# Patient Record
Sex: Female | Born: 1935 | Race: White | Hispanic: No | Marital: Married | Smoking: Never smoker
Health system: Southern US, Community
[De-identification: ages and names within clinical notes are randomized; demographics above are authoritative.]

## PROBLEM LIST (undated history)

## (undated) DIAGNOSIS — D649 Anemia, unspecified: Secondary | ICD-10-CM

## (undated) DIAGNOSIS — IMO0001 Reserved for inherently not codable concepts without codable children: Secondary | ICD-10-CM

## (undated) DIAGNOSIS — K589 Irritable bowel syndrome without diarrhea: Secondary | ICD-10-CM

## (undated) DIAGNOSIS — I499 Cardiac arrhythmia, unspecified: Secondary | ICD-10-CM

## (undated) DIAGNOSIS — R112 Nausea with vomiting, unspecified: Secondary | ICD-10-CM

## (undated) DIAGNOSIS — Z923 Personal history of irradiation: Secondary | ICD-10-CM

## (undated) DIAGNOSIS — J189 Pneumonia, unspecified organism: Secondary | ICD-10-CM

## (undated) DIAGNOSIS — B029 Zoster without complications: Secondary | ICD-10-CM

## (undated) DIAGNOSIS — C50919 Malignant neoplasm of unspecified site of unspecified female breast: Secondary | ICD-10-CM

## (undated) DIAGNOSIS — N200 Calculus of kidney: Secondary | ICD-10-CM

## (undated) DIAGNOSIS — N189 Chronic kidney disease, unspecified: Secondary | ICD-10-CM

## (undated) DIAGNOSIS — K449 Diaphragmatic hernia without obstruction or gangrene: Secondary | ICD-10-CM

## (undated) DIAGNOSIS — I82409 Acute embolism and thrombosis of unspecified deep veins of unspecified lower extremity: Secondary | ICD-10-CM

## (undated) DIAGNOSIS — N183 Chronic kidney disease, stage 3 unspecified: Secondary | ICD-10-CM

## (undated) DIAGNOSIS — I10 Essential (primary) hypertension: Secondary | ICD-10-CM

## (undated) DIAGNOSIS — R0602 Shortness of breath: Secondary | ICD-10-CM

## (undated) DIAGNOSIS — I639 Cerebral infarction, unspecified: Secondary | ICD-10-CM

## (undated) DIAGNOSIS — M1990 Unspecified osteoarthritis, unspecified site: Secondary | ICD-10-CM

## (undated) DIAGNOSIS — Z9889 Other specified postprocedural states: Secondary | ICD-10-CM

## (undated) DIAGNOSIS — Z5189 Encounter for other specified aftercare: Secondary | ICD-10-CM

## (undated) DIAGNOSIS — F419 Anxiety disorder, unspecified: Secondary | ICD-10-CM

## (undated) DIAGNOSIS — J40 Bronchitis, not specified as acute or chronic: Secondary | ICD-10-CM

## (undated) DIAGNOSIS — K635 Polyp of colon: Secondary | ICD-10-CM

## (undated) DIAGNOSIS — K5790 Diverticulosis of intestine, part unspecified, without perforation or abscess without bleeding: Secondary | ICD-10-CM

## (undated) DIAGNOSIS — E785 Hyperlipidemia, unspecified: Secondary | ICD-10-CM

## (undated) DIAGNOSIS — I4892 Unspecified atrial flutter: Secondary | ICD-10-CM

## (undated) DIAGNOSIS — K222 Esophageal obstruction: Secondary | ICD-10-CM

## (undated) DIAGNOSIS — C4490 Unspecified malignant neoplasm of skin, unspecified: Secondary | ICD-10-CM

## (undated) DIAGNOSIS — N390 Urinary tract infection, site not specified: Secondary | ICD-10-CM

## (undated) DIAGNOSIS — K219 Gastro-esophageal reflux disease without esophagitis: Secondary | ICD-10-CM

## (undated) HISTORY — DX: Polyp of colon: K63.5

## (undated) HISTORY — DX: Anxiety disorder, unspecified: F41.9

## (undated) HISTORY — DX: Chronic kidney disease, stage 3 (moderate): N18.3

## (undated) HISTORY — PX: OTHER SURGICAL HISTORY: SHX169

## (undated) HISTORY — DX: Diverticulosis of intestine, part unspecified, without perforation or abscess without bleeding: K57.90

## (undated) HISTORY — DX: Unspecified osteoarthritis, unspecified site: M19.90

## (undated) HISTORY — PX: BILE DUCT STENT PLACEMENT: SHX1227

## (undated) HISTORY — PX: INCISION AND DRAINAGE BREAST ABSCESS: SUR672

## (undated) HISTORY — PX: COLON SURGERY: SHX602

## (undated) HISTORY — DX: Cerebral infarction, unspecified: I63.9

## (undated) HISTORY — PX: REPLACEMENT TOTAL KNEE BILATERAL: SUR1225

## (undated) HISTORY — PX: ABDOMINAL HYSTERECTOMY: SHX81

## (undated) HISTORY — DX: Diaphragmatic hernia without obstruction or gangrene: K44.9

## (undated) HISTORY — DX: Chronic kidney disease, stage 3 unspecified: N18.30

## (undated) HISTORY — DX: Irritable bowel syndrome, unspecified: K58.9

## (undated) HISTORY — DX: Zoster without complications: B02.9

## (undated) HISTORY — PX: CATARACT EXTRACTION W/ INTRAOCULAR LENS  IMPLANT, BILATERAL: SHX1307

## (undated) HISTORY — DX: Morbid (severe) obesity due to excess calories: E66.01

## (undated) HISTORY — DX: Malignant neoplasm of unspecified site of unspecified female breast: C50.919

## (undated) HISTORY — PX: TONSILLECTOMY: SUR1361

## (undated) HISTORY — DX: Unspecified malignant neoplasm of skin, unspecified: C44.90

## (undated) HISTORY — DX: Essential (primary) hypertension: I10

## (undated) HISTORY — DX: Esophageal obstruction: K22.2

## (undated) HISTORY — PX: TOTAL HIP ARTHROPLASTY: SHX124

## (undated) HISTORY — DX: Hyperlipidemia, unspecified: E78.5

## (undated) HISTORY — PX: JOINT REPLACEMENT: SHX530

---

## 1989-02-11 DIAGNOSIS — N200 Calculus of kidney: Secondary | ICD-10-CM

## 1989-02-11 HISTORY — DX: Calculus of kidney: N20.0

## 1998-03-02 ENCOUNTER — Other Ambulatory Visit: Admission: RE | Admit: 1998-03-02 | Discharge: 1998-03-02 | Payer: Self-pay | Admitting: *Deleted

## 1999-04-23 ENCOUNTER — Encounter: Admission: RE | Admit: 1999-04-23 | Discharge: 1999-04-23 | Payer: Self-pay | Admitting: Surgery

## 1999-04-23 ENCOUNTER — Encounter: Payer: Self-pay | Admitting: Surgery

## 1999-04-30 ENCOUNTER — Encounter (INDEPENDENT_AMBULATORY_CARE_PROVIDER_SITE_OTHER): Payer: Self-pay | Admitting: *Deleted

## 1999-04-30 ENCOUNTER — Ambulatory Visit (HOSPITAL_COMMUNITY): Admission: RE | Admit: 1999-04-30 | Discharge: 1999-04-30 | Payer: Self-pay | Admitting: Surgery

## 1999-04-30 ENCOUNTER — Encounter: Payer: Self-pay | Admitting: Surgery

## 1999-07-02 ENCOUNTER — Encounter: Payer: Self-pay | Admitting: Surgery

## 1999-07-06 ENCOUNTER — Encounter (INDEPENDENT_AMBULATORY_CARE_PROVIDER_SITE_OTHER): Payer: Self-pay | Admitting: *Deleted

## 1999-07-06 ENCOUNTER — Inpatient Hospital Stay (HOSPITAL_COMMUNITY): Admission: RE | Admit: 1999-07-06 | Discharge: 1999-07-10 | Payer: Self-pay | Admitting: Surgery

## 1999-11-30 ENCOUNTER — Emergency Department (HOSPITAL_COMMUNITY): Admission: EM | Admit: 1999-11-30 | Discharge: 1999-11-30 | Payer: Self-pay | Admitting: Podiatry

## 2000-06-13 DIAGNOSIS — I639 Cerebral infarction, unspecified: Secondary | ICD-10-CM

## 2000-06-13 HISTORY — DX: Cerebral infarction, unspecified: I63.9

## 2000-09-03 ENCOUNTER — Emergency Department (HOSPITAL_COMMUNITY): Admission: EM | Admit: 2000-09-03 | Discharge: 2000-09-03 | Payer: Self-pay

## 2000-09-03 ENCOUNTER — Encounter: Payer: Self-pay | Admitting: Emergency Medicine

## 2000-09-07 ENCOUNTER — Encounter: Admission: RE | Admit: 2000-09-07 | Discharge: 2000-09-07 | Payer: Self-pay | Admitting: Surgery

## 2000-09-07 ENCOUNTER — Encounter: Payer: Self-pay | Admitting: Surgery

## 2000-09-11 ENCOUNTER — Emergency Department (HOSPITAL_COMMUNITY): Admission: EM | Admit: 2000-09-11 | Discharge: 2000-09-11 | Payer: Self-pay | Admitting: Emergency Medicine

## 2000-09-11 ENCOUNTER — Encounter: Payer: Self-pay | Admitting: Emergency Medicine

## 2000-09-21 ENCOUNTER — Encounter: Admission: RE | Admit: 2000-09-21 | Discharge: 2000-09-21 | Payer: Self-pay | Admitting: Surgery

## 2000-09-21 ENCOUNTER — Encounter: Payer: Self-pay | Admitting: Surgery

## 2000-10-30 ENCOUNTER — Encounter: Payer: Self-pay | Admitting: Surgery

## 2000-11-01 ENCOUNTER — Inpatient Hospital Stay (HOSPITAL_COMMUNITY): Admission: RE | Admit: 2000-11-01 | Discharge: 2000-11-06 | Payer: Self-pay | Admitting: Surgery

## 2000-11-01 ENCOUNTER — Encounter (INDEPENDENT_AMBULATORY_CARE_PROVIDER_SITE_OTHER): Payer: Self-pay | Admitting: Specialist

## 2000-11-15 ENCOUNTER — Ambulatory Visit: Admission: RE | Admit: 2000-11-15 | Discharge: 2001-02-13 | Payer: Self-pay | Admitting: Radiation Oncology

## 2000-11-16 ENCOUNTER — Encounter: Admission: RE | Admit: 2000-11-16 | Discharge: 2000-11-16 | Payer: Self-pay | Admitting: Family Medicine

## 2000-11-16 ENCOUNTER — Encounter: Payer: Self-pay | Admitting: Family Medicine

## 2001-02-14 ENCOUNTER — Ambulatory Visit: Admission: RE | Admit: 2001-02-14 | Discharge: 2001-05-15 | Payer: Self-pay | Admitting: Radiation Oncology

## 2001-10-01 ENCOUNTER — Encounter: Payer: Self-pay | Admitting: Family Medicine

## 2001-10-01 ENCOUNTER — Encounter: Admission: RE | Admit: 2001-10-01 | Discharge: 2001-10-01 | Payer: Self-pay | Admitting: Family Medicine

## 2001-10-02 ENCOUNTER — Encounter: Admission: RE | Admit: 2001-10-02 | Discharge: 2001-10-02 | Payer: Self-pay | Admitting: Family Medicine

## 2001-10-02 ENCOUNTER — Encounter: Payer: Self-pay | Admitting: Family Medicine

## 2002-01-22 ENCOUNTER — Encounter: Payer: Self-pay | Admitting: Family Medicine

## 2002-01-22 ENCOUNTER — Encounter: Admission: RE | Admit: 2002-01-22 | Discharge: 2002-01-22 | Payer: Self-pay | Admitting: Family Medicine

## 2002-05-27 ENCOUNTER — Inpatient Hospital Stay (HOSPITAL_COMMUNITY): Admission: EM | Admit: 2002-05-27 | Discharge: 2002-05-28 | Payer: Self-pay | Admitting: Emergency Medicine

## 2002-05-27 ENCOUNTER — Encounter: Payer: Self-pay | Admitting: *Deleted

## 2002-05-27 ENCOUNTER — Encounter: Payer: Self-pay | Admitting: Cardiovascular Disease

## 2002-05-27 ENCOUNTER — Encounter: Payer: Self-pay | Admitting: Emergency Medicine

## 2002-05-30 ENCOUNTER — Encounter: Admission: RE | Admit: 2002-05-30 | Discharge: 2002-06-10 | Payer: Self-pay | Admitting: Neurology

## 2003-01-27 ENCOUNTER — Encounter: Admission: RE | Admit: 2003-01-27 | Discharge: 2003-01-27 | Payer: Self-pay | Admitting: Internal Medicine

## 2003-01-27 ENCOUNTER — Encounter: Payer: Self-pay | Admitting: Internal Medicine

## 2003-03-06 ENCOUNTER — Encounter: Payer: Self-pay | Admitting: *Deleted

## 2003-03-06 ENCOUNTER — Emergency Department (HOSPITAL_COMMUNITY): Admission: EM | Admit: 2003-03-06 | Discharge: 2003-03-07 | Payer: Self-pay | Admitting: *Deleted

## 2003-04-15 ENCOUNTER — Ambulatory Visit (HOSPITAL_COMMUNITY): Admission: RE | Admit: 2003-04-15 | Discharge: 2003-04-15 | Payer: Self-pay | Admitting: Gastroenterology

## 2003-04-15 ENCOUNTER — Encounter (INDEPENDENT_AMBULATORY_CARE_PROVIDER_SITE_OTHER): Payer: Self-pay | Admitting: Specialist

## 2003-04-15 LAB — HM COLONOSCOPY

## 2003-06-29 ENCOUNTER — Encounter: Admission: RE | Admit: 2003-06-29 | Discharge: 2003-06-29 | Payer: Self-pay | Admitting: Orthopedic Surgery

## 2003-07-14 ENCOUNTER — Ambulatory Visit (HOSPITAL_COMMUNITY): Admission: RE | Admit: 2003-07-14 | Discharge: 2003-07-14 | Payer: Self-pay | Admitting: Orthopedic Surgery

## 2004-02-02 ENCOUNTER — Encounter: Admission: RE | Admit: 2004-02-02 | Discharge: 2004-02-02 | Payer: Self-pay | Admitting: Internal Medicine

## 2005-02-15 ENCOUNTER — Encounter: Admission: RE | Admit: 2005-02-15 | Discharge: 2005-02-15 | Payer: Self-pay | Admitting: Internal Medicine

## 2005-03-16 ENCOUNTER — Ambulatory Visit: Payer: Self-pay | Admitting: Internal Medicine

## 2005-03-28 ENCOUNTER — Encounter: Admission: RE | Admit: 2005-03-28 | Discharge: 2005-04-26 | Payer: Self-pay | Admitting: Internal Medicine

## 2005-07-25 ENCOUNTER — Ambulatory Visit: Payer: Self-pay | Admitting: Internal Medicine

## 2006-02-07 ENCOUNTER — Emergency Department (HOSPITAL_COMMUNITY): Admission: EM | Admit: 2006-02-07 | Discharge: 2006-02-07 | Payer: Self-pay | Admitting: *Deleted

## 2006-02-08 ENCOUNTER — Ambulatory Visit: Payer: Self-pay | Admitting: Internal Medicine

## 2006-02-16 ENCOUNTER — Encounter: Admission: RE | Admit: 2006-02-16 | Discharge: 2006-02-16 | Payer: Self-pay | Admitting: Internal Medicine

## 2006-02-20 ENCOUNTER — Encounter: Admission: RE | Admit: 2006-02-20 | Discharge: 2006-02-20 | Payer: Self-pay | Admitting: Orthopedic Surgery

## 2006-07-10 ENCOUNTER — Emergency Department (HOSPITAL_COMMUNITY): Admission: EM | Admit: 2006-07-10 | Discharge: 2006-07-10 | Payer: Self-pay | Admitting: Emergency Medicine

## 2006-07-31 ENCOUNTER — Encounter: Admission: RE | Admit: 2006-07-31 | Discharge: 2006-07-31 | Payer: Self-pay | Admitting: Orthopedic Surgery

## 2006-08-11 ENCOUNTER — Encounter: Admission: RE | Admit: 2006-08-11 | Discharge: 2006-08-11 | Payer: Self-pay | Admitting: General Surgery

## 2006-08-16 ENCOUNTER — Encounter: Admission: RE | Admit: 2006-08-16 | Discharge: 2006-08-16 | Payer: Self-pay | Admitting: General Surgery

## 2006-08-31 ENCOUNTER — Ambulatory Visit: Payer: Self-pay | Admitting: Internal Medicine

## 2006-09-06 ENCOUNTER — Ambulatory Visit: Payer: Self-pay | Admitting: Internal Medicine

## 2006-09-13 ENCOUNTER — Ambulatory Visit (HOSPITAL_COMMUNITY): Admission: RE | Admit: 2006-09-13 | Discharge: 2006-09-13 | Payer: Self-pay | Admitting: Internal Medicine

## 2006-09-21 ENCOUNTER — Encounter: Payer: Self-pay | Admitting: Internal Medicine

## 2006-09-21 ENCOUNTER — Ambulatory Visit: Payer: Self-pay

## 2006-09-21 ENCOUNTER — Ambulatory Visit: Payer: Self-pay | Admitting: Internal Medicine

## 2006-09-26 ENCOUNTER — Ambulatory Visit: Payer: Self-pay | Admitting: Cardiovascular Disease

## 2006-10-06 ENCOUNTER — Ambulatory Visit: Payer: Self-pay | Admitting: Internal Medicine

## 2007-02-10 ENCOUNTER — Encounter: Payer: Self-pay | Admitting: Internal Medicine

## 2007-02-10 DIAGNOSIS — D126 Benign neoplasm of colon, unspecified: Secondary | ICD-10-CM | POA: Insufficient documentation

## 2007-02-10 DIAGNOSIS — E785 Hyperlipidemia, unspecified: Secondary | ICD-10-CM | POA: Insufficient documentation

## 2007-02-10 DIAGNOSIS — M109 Gout, unspecified: Secondary | ICD-10-CM | POA: Insufficient documentation

## 2007-02-10 DIAGNOSIS — I6350 Cerebral infarction due to unspecified occlusion or stenosis of unspecified cerebral artery: Secondary | ICD-10-CM | POA: Insufficient documentation

## 2007-02-10 DIAGNOSIS — K5730 Diverticulosis of large intestine without perforation or abscess without bleeding: Secondary | ICD-10-CM | POA: Insufficient documentation

## 2007-02-10 DIAGNOSIS — F411 Generalized anxiety disorder: Secondary | ICD-10-CM | POA: Insufficient documentation

## 2007-02-10 DIAGNOSIS — I10 Essential (primary) hypertension: Secondary | ICD-10-CM | POA: Insufficient documentation

## 2007-02-10 DIAGNOSIS — K589 Irritable bowel syndrome without diarrhea: Secondary | ICD-10-CM | POA: Insufficient documentation

## 2007-02-12 DIAGNOSIS — Z860100 Personal history of colon polyps, unspecified: Secondary | ICD-10-CM | POA: Insufficient documentation

## 2007-02-12 DIAGNOSIS — Z8679 Personal history of other diseases of the circulatory system: Secondary | ICD-10-CM | POA: Insufficient documentation

## 2007-02-22 ENCOUNTER — Encounter: Admission: RE | Admit: 2007-02-22 | Discharge: 2007-02-22 | Payer: Self-pay | Admitting: Internal Medicine

## 2007-08-24 ENCOUNTER — Encounter: Payer: Self-pay | Admitting: Internal Medicine

## 2007-10-10 ENCOUNTER — Encounter: Payer: Self-pay | Admitting: Internal Medicine

## 2007-10-17 ENCOUNTER — Ambulatory Visit: Payer: Self-pay | Admitting: Internal Medicine

## 2007-10-17 DIAGNOSIS — R5383 Other fatigue: Secondary | ICD-10-CM

## 2007-10-17 DIAGNOSIS — J019 Acute sinusitis, unspecified: Secondary | ICD-10-CM | POA: Insufficient documentation

## 2007-10-17 DIAGNOSIS — G609 Hereditary and idiopathic neuropathy, unspecified: Secondary | ICD-10-CM | POA: Insufficient documentation

## 2007-10-17 DIAGNOSIS — R5381 Other malaise: Secondary | ICD-10-CM | POA: Insufficient documentation

## 2007-10-19 LAB — CONVERTED CEMR LAB
ALT: 20 units/L (ref 0–35)
AST: 22 units/L (ref 0–37)
Albumin: 3.4 g/dL — ABNORMAL LOW (ref 3.5–5.2)
Alkaline Phosphatase: 85 units/L (ref 39–117)
BUN: 35 mg/dL — ABNORMAL HIGH (ref 6–23)
Basophils Absolute: 0 10*3/uL (ref 0.0–0.1)
Basophils Relative: 0.2 % (ref 0.0–1.0)
Bilirubin, Direct: 0.1 mg/dL (ref 0.0–0.3)
CO2: 31 meq/L (ref 19–32)
Calcium: 9.1 mg/dL (ref 8.4–10.5)
Chloride: 110 meq/L (ref 96–112)
Cholesterol: 221 mg/dL (ref 0–200)
Creatinine, Ser: 1.5 mg/dL — ABNORMAL HIGH (ref 0.4–1.2)
Direct LDL: 164.6 mg/dL
Eosinophils Absolute: 0.2 10*3/uL (ref 0.0–0.7)
Eosinophils Relative: 2.7 % (ref 0.0–5.0)
GFR calc Af Amer: 44 mL/min
GFR calc non Af Amer: 36 mL/min
Glucose, Bld: 144 mg/dL — ABNORMAL HIGH (ref 70–99)
HCT: 42.8 % (ref 36.0–46.0)
HDL: 31.3 mg/dL — ABNORMAL LOW (ref >39.0)
Hemoglobin: 14.1 g/dL (ref 12.0–15.0)
Hgb A1c MFr Bld: 6.5 % — ABNORMAL HIGH (ref 4.6–6.0)
Lymphocytes Relative: 19 % (ref 12.0–46.0)
MCHC: 32.9 g/dL (ref 30.0–36.0)
MCV: 88.4 fL (ref 78.0–100.0)
Monocytes Absolute: 0.6 10*3/uL (ref 0.1–1.0)
Monocytes Relative: 7.8 % (ref 3.0–12.0)
Neutro Abs: 5.7 10*3/uL (ref 1.4–7.7)
Neutrophils Relative %: 70.3 % (ref 43.0–77.0)
Platelets: 216 10*3/uL (ref 150–400)
Potassium: 4.2 meq/L (ref 3.5–5.1)
RBC: 4.84 M/uL (ref 3.87–5.11)
RDW: 14.8 % — ABNORMAL HIGH (ref 11.5–14.6)
Sodium: 146 meq/L — ABNORMAL HIGH (ref 135–145)
TSH: 4.3 microintl units/mL (ref 0.35–5.50)
Total Bilirubin: 0.9 mg/dL (ref 0.3–1.2)
Total CHOL/HDL Ratio: 7.1
Total Protein: 6.6 g/dL (ref 6.0–8.3)
Triglycerides: 230 mg/dL (ref 0–149)
Uric Acid, Serum: 7.7 mg/dL — ABNORMAL HIGH (ref 2.4–7.0)
VLDL: 46 mg/dL — ABNORMAL HIGH (ref 0–40)
WBC: 8 10*3/uL (ref 4.5–10.5)

## 2007-11-01 ENCOUNTER — Emergency Department (HOSPITAL_COMMUNITY): Admission: EM | Admit: 2007-11-01 | Discharge: 2007-11-01 | Payer: Self-pay | Admitting: Emergency Medicine

## 2007-11-01 ENCOUNTER — Encounter (INDEPENDENT_AMBULATORY_CARE_PROVIDER_SITE_OTHER): Payer: Self-pay | Admitting: Emergency Medicine

## 2007-11-01 ENCOUNTER — Ambulatory Visit: Payer: Self-pay | Admitting: Vascular Surgery

## 2008-01-09 ENCOUNTER — Ambulatory Visit: Payer: Self-pay | Admitting: Internal Medicine

## 2008-01-09 DIAGNOSIS — E119 Type 2 diabetes mellitus without complications: Secondary | ICD-10-CM | POA: Insufficient documentation

## 2008-01-25 ENCOUNTER — Ambulatory Visit: Payer: Self-pay | Admitting: Cardiovascular Disease

## 2008-02-25 ENCOUNTER — Encounter: Admission: RE | Admit: 2008-02-25 | Discharge: 2008-02-25 | Payer: Self-pay | Admitting: Internal Medicine

## 2008-03-17 ENCOUNTER — Inpatient Hospital Stay (HOSPITAL_COMMUNITY): Admission: RE | Admit: 2008-03-17 | Discharge: 2008-03-21 | Payer: Self-pay | Admitting: Orthopedic Surgery

## 2008-03-26 ENCOUNTER — Telehealth (INDEPENDENT_AMBULATORY_CARE_PROVIDER_SITE_OTHER): Payer: Self-pay | Admitting: *Deleted

## 2008-03-27 ENCOUNTER — Telehealth (INDEPENDENT_AMBULATORY_CARE_PROVIDER_SITE_OTHER): Payer: Self-pay | Admitting: *Deleted

## 2008-04-08 ENCOUNTER — Ambulatory Visit: Payer: Self-pay | Admitting: Internal Medicine

## 2008-04-08 DIAGNOSIS — J069 Acute upper respiratory infection, unspecified: Secondary | ICD-10-CM | POA: Insufficient documentation

## 2008-04-17 ENCOUNTER — Ambulatory Visit: Payer: Self-pay | Admitting: Internal Medicine

## 2009-01-17 ENCOUNTER — Emergency Department (HOSPITAL_COMMUNITY): Admission: EM | Admit: 2009-01-17 | Discharge: 2009-01-17 | Payer: Self-pay | Admitting: Emergency Medicine

## 2009-01-21 ENCOUNTER — Telehealth: Payer: Self-pay | Admitting: Internal Medicine

## 2009-01-21 DIAGNOSIS — K839 Disease of biliary tract, unspecified: Secondary | ICD-10-CM | POA: Insufficient documentation

## 2009-01-22 ENCOUNTER — Telehealth: Payer: Self-pay | Admitting: Internal Medicine

## 2009-02-26 ENCOUNTER — Encounter: Admission: RE | Admit: 2009-02-26 | Discharge: 2009-02-26 | Payer: Self-pay | Admitting: Internal Medicine

## 2009-06-17 ENCOUNTER — Ambulatory Visit: Payer: Self-pay | Admitting: Internal Medicine

## 2009-06-17 DIAGNOSIS — R062 Wheezing: Secondary | ICD-10-CM | POA: Insufficient documentation

## 2009-06-18 LAB — CONVERTED CEMR LAB: Vit D, 25-Hydroxy: 16 ng/mL — ABNORMAL LOW (ref 30–89)

## 2009-07-13 ENCOUNTER — Inpatient Hospital Stay (HOSPITAL_COMMUNITY): Admission: RE | Admit: 2009-07-13 | Discharge: 2009-07-18 | Payer: Self-pay | Admitting: Orthopedic Surgery

## 2009-07-14 ENCOUNTER — Telehealth: Payer: Self-pay | Admitting: Internal Medicine

## 2009-11-16 ENCOUNTER — Ambulatory Visit: Payer: Self-pay | Admitting: Vascular Surgery

## 2009-11-16 ENCOUNTER — Encounter (INDEPENDENT_AMBULATORY_CARE_PROVIDER_SITE_OTHER): Payer: Self-pay | Admitting: Emergency Medicine

## 2009-11-16 ENCOUNTER — Emergency Department (HOSPITAL_COMMUNITY): Admission: EM | Admit: 2009-11-16 | Discharge: 2009-11-16 | Payer: Self-pay | Admitting: Emergency Medicine

## 2010-03-15 ENCOUNTER — Encounter: Admission: RE | Admit: 2010-03-15 | Discharge: 2010-03-15 | Payer: Self-pay | Admitting: Internal Medicine

## 2010-03-15 LAB — HM MAMMOGRAPHY: HM Mammogram: NEGATIVE

## 2010-06-13 HISTORY — PX: HEMORRHOID SURGERY: SHX153

## 2010-07-11 LAB — CONVERTED CEMR LAB
ALT: 19 units/L (ref 0–35)
AST: 24 units/L (ref 0–37)
Albumin: 3.5 g/dL (ref 3.5–5.2)
Alkaline Phosphatase: 85 units/L (ref 39–117)
BUN: 30 mg/dL — ABNORMAL HIGH (ref 6–23)
Basophils Absolute: 0 10*3/uL (ref 0.0–0.1)
Basophils Relative: 0.6 % (ref 0.0–3.0)
Bilirubin Urine: NEGATIVE
Bilirubin, Direct: 0.2 mg/dL (ref 0.0–0.3)
CO2: 31 meq/L (ref 19–32)
Calcium: 9.5 mg/dL (ref 8.4–10.5)
Chloride: 105 meq/L (ref 96–112)
Cholesterol: 207 mg/dL — ABNORMAL HIGH (ref 0–200)
Creatinine, Ser: 1.1 mg/dL (ref 0.4–1.2)
Creatinine,U: 145.4 mg/dL
Direct LDL: 150.9 mg/dL
Eosinophils Absolute: 0.3 10*3/uL (ref 0.0–0.7)
Eosinophils Relative: 3.6 % (ref 0.0–5.0)
Folate: 12.9 ng/mL
GFR calc non Af Amer: 51.14 mL/min (ref >60)
Glucose, Bld: 107 mg/dL — ABNORMAL HIGH (ref 70–99)
HCT: 43.3 % (ref 36.0–46.0)
HDL: 35 mg/dL — ABNORMAL LOW (ref >39.00)
Hemoglobin, Urine: NEGATIVE
Hemoglobin: 13.9 g/dL (ref 12.0–15.0)
Hgb A1c MFr Bld: 6.5 % (ref 4.6–6.5)
Iron: 60 ug/dL (ref 42–145)
Ketones, ur: NEGATIVE mg/dL
Leukocytes, UA: NEGATIVE
Lymphocytes Relative: 20.4 % (ref 12.0–46.0)
Lymphs Abs: 1.7 10*3/uL (ref 0.7–4.0)
MCHC: 32.1 g/dL (ref 30.0–36.0)
MCV: 90.6 fL (ref 78.0–100.0)
Microalb Creat Ratio: 2.8 mg/g (ref 0.0–30.0)
Microalb, Ur: 0.4 mg/dL (ref 0.0–1.9)
Monocytes Absolute: 0.6 10*3/uL (ref 0.1–1.0)
Monocytes Relative: 7.5 % (ref 3.0–12.0)
Neutro Abs: 5.7 10*3/uL (ref 1.4–7.7)
Neutrophils Relative %: 67.9 % (ref 43.0–77.0)
Nitrite: NEGATIVE
Platelets: 205 10*3/uL (ref 150.0–400.0)
Potassium: 4.4 meq/L (ref 3.5–5.1)
RBC: 4.78 M/uL (ref 3.87–5.11)
RDW: 15.4 % — ABNORMAL HIGH (ref 11.5–14.6)
Saturation Ratios: 24.1 % (ref 20.0–50.0)
Sed Rate: 40 mm/hr — ABNORMAL HIGH (ref 0–22)
Sodium: 144 meq/L (ref 135–145)
Specific Gravity, Urine: 1.02 (ref 1.000–1.030)
TSH: 4.22 microintl units/mL (ref 0.35–5.50)
Total Bilirubin: 1 mg/dL (ref 0.3–1.2)
Total CHOL/HDL Ratio: 6
Total Protein, Urine: NEGATIVE mg/dL
Total Protein: 7.3 g/dL (ref 6.0–8.3)
Transferrin: 178 mg/dL — ABNORMAL LOW (ref 212.0–360.0)
Triglycerides: 164 mg/dL — ABNORMAL HIGH (ref 0.0–149.0)
Urine Glucose: NEGATIVE mg/dL
Urobilinogen, UA: 0.2 (ref 0.0–1.0)
VLDL: 32.8 mg/dL (ref 0.0–40.0)
Vitamin B-12: 297 pg/mL (ref 211–911)
WBC: 8.3 10*3/uL (ref 4.5–10.5)
pH: 5.5 (ref 5.0–8.0)

## 2010-07-15 NOTE — Progress Notes (Signed)
Summary: high bp  Phone Note Call from Patient Call back at Home Phone [phone]   Caller: [phone] Call For: Biagio Borg MD Summary of Call: per pt call cant  come in today for ov bedcause she had knee replacement surgery .had high bp of  195/102 ten .. pt took an extra bp medication today want to know what to do   the [facility] nurse called on yesterday about her bp being high also .Marland Kitchen Initial call taken by: Nonah Mattes,  March 27, 2008 1:30 PM  Follow-up for Phone Call        does she know what the heart rate is?  this is very difficult to do over the phone Follow-up by: Biagio Borg MD,  March 27, 2008 2:12 PM  Additional Follow-up for Phone Call Additional follow up Details #1::        March 27, 2008 3:17 PM called pt again to find out her heart rate phone busy again Additional Follow-up by: Nonah Mattes,  March 27, 2008 3:17 PM    Additional Follow-up for Phone Call Additional follow up Details #2::    per pt stated  her hear rate was good , do not know the reading per [facility]  Follow-up by: Nonah Mattes,  March 27, 2008 4:27 PM  Additional Follow-up for Phone Call Additional follow up Details #3:: Details for Additional Follow-up Action Taken: ok for atenolol 50 mg per day - will do escript Additional Follow-up by: Biagio Borg MD,  March 27, 2008 4:30 PM  New/Updated Medications: ATENOLOL 50 MG TABS (ATENOLOL) 1po once daily informed pt that rx was sent to pharmacy made aware  Prescriptions: ATENOLOL 50 MG TABS (ATENOLOL) 1po once daily  #90 x 3   Entered and Authorized by:   Biagio Borg MD   Signed by:   Biagio Borg MD on 03/27/2008   Method used:   Electronically to        [facility] (retail)       [address] [place], [place]  [address]       Ph: [phone]       Fax: [phone]   RxID:   [id number]

## 2010-07-15 NOTE — Progress Notes (Signed)
Summary: high bp  Phone Note Call from Patient Call back at Home Phone [phone]   Caller: nurse [phone] Call For: dr Jenny Reichmann Summary of Call: per  [facility] nurse call pt bp running high 200/105 pt had a total knee replacement (TKR) surgery, should she be seen Initial call taken by: Nonah Mattes,  March 26, 2008 3:01 PM  Follow-up for Phone Call        ok to take an extra diltiazem now, then have ROV tomorrow Follow-up by: Biagio Borg MD,  March 26, 2008 3:15 PM  Additional Follow-up for Phone Call Additional follow up Details #1::        called [facility] nurse  was told to call the pt so she would know called pt  [phone] busy [place]  PT PHONE IS BUSY ,[place] Additional Follow-up by: Nonah Mattes,  March 26, 2008 3:31 PM

## 2010-07-15 NOTE — Assessment & Plan Note (Signed)
Summary: FU ON BP PER TRIAGE/ $50 /NWS   Vital Signs:  Patient Profile:   77 Years Old Female Weight:      249 pounds O2 Sat:      92 % O2 treatment:    Room Air Temp:     97.7 degrees F oral Pulse rate:   56 / minute BP sitting:   124 / 62  (right arm) Cuff size:   large  Vitals Entered By: Sherlean Foot CMA (April 08, 2008 4:36 PM)                 Chief Complaint:  f/u on bp.  History of Present Illness: overall doing well, no complaints, denies polys or low sugars , compliant with meds, no luck with losing wt with the recent left knee surgury for which she had some confusion with the percocet post op; denies polys or low sugars, no other complaints except for some mild situational depression episode post op but states she feels better now, and her husband has been good support, denies need for any tx at this time, no suicidal    Updated Prior Medication List: ALLOPURINOL 100 MG  TABS (ALLOPURINOL) 2 tabs by mouth every other day,1 by mouth qod LISINOPRIL 40 MG TABS (LISINOPRIL) 1 by mouth once daily FUROSEMIDE 40 MG  TABS (FUROSEMIDE) 1 by mouth qd LISINOPRIL 20 MG  TABS (LISINOPRIL) 2 by mouth qd ECOTRIN 325 MG  TBEC (ASPIRIN)  AZITHROMYCIN 250 MG  TABS (AZITHROMYCIN) 2po qd for 1 day, then 1po qd for 4days, then stop DARVOCET-N 100 100-650 MG  TABS (PROPOXYPHENE N-APAP) 1po two times a day prn  Current Allergies (reviewed today): ! SULFA ! * CODIENE ! * ZOCOR * CRESTOR PERCOCET  Past Medical History:    Reviewed history from 01/09/2008 and no changes required:       Anxiety       Diverticulosis, colon       Gout       Hyperlipidemia       Hypertension       Colonic polyps, hx of       Cerebrovascular accident, hx of - right thalamic 12/03       IBS       Peripheral neuropathy       Diabetes mellitus, type II  Past Surgical History:    Reviewed history from 10/17/2007 and no changes required:       Benign pelvic bone tumor removal       Laparotomy  with repair of colonic perforation - 1995       Endoscopic retrograde cholangiopancreatography with biliary spincterotomy       Hysterectomy       s/p right hip arthroplasty for DJD   Social History:    Reviewed history from 10/17/2007 and no changes required:       Married       3 children - 1 deceased with meningitis       Never Smoked       Alcohol use-no       semi- retired Dietitian    Review of Systems       all otherwise negative    Physical Exam  General:     alert and overweight-appearing.   Head:     Normocephalic and atraumatic without obvious abnormalities. No apparent alopecia or balding. Eyes:     No corneal or conjunctival inflammation noted. EOMI. Perrla.  Ears:     External ear exam  shows no significant lesions or deformities.  Otoscopic examination reveals clear canals, tympanic membranes are intact bilaterally without bulging, retraction, inflammation or discharge. Hearing is grossly normal bilaterally. Nose:     External nasal examination shows no deformity or inflammation. Nasal mucosa are pink and moist without lesions or exudates. Mouth:     Oral mucosa and oropharynx without lesions or exudates.  Teeth in good repair. Neck:     No deformities, masses, or tenderness noted. Lungs:     Normal respiratory effort, chest expands symmetrically. Lungs are clear to auscultation, no crackles or wheezes. Heart:     Normal rate and regular rhythm. S1 and S2 normal without gallop, murmur, click, rub or other extra sounds. Extremities:     no edema, no ulcers     Impression & Recommendations:  Problem # 1:  HYPERTENSION (ICD-401.9)  Her updated medication list for this problem includes:    Lisinopril 40 Mg Tabs (Lisinopril) .Marland Kitchen... 1 by mouth once daily    Furosemide 40 Mg Tabs (Furosemide) .Marland Kitchen... 1 by mouth qd    Lisinopril 20 Mg Tabs (Lisinopril) .Marland Kitchen... 2 by mouth qd  BP today: 124/62 Prior BP: 130/76 (01/09/2008)  Labs Reviewed: Creat: 1.5  (10/17/2007) Chol: 221 (10/17/2007)   HDL: 31.3 (10/17/2007)   LDL: DEL (10/17/2007)   TG: 230 (10/17/2007) stable overall by hx and exam, ok to continue meds/tx as is   Problem # 2:  DIABETES MELLITUS, TYPE II (ICD-250.00)  Her updated medication list for this problem includes:    Lisinopril 40 Mg Tabs (Lisinopril) .Marland Kitchen... 1 by mouth once daily    Lisinopril 20 Mg Tabs (Lisinopril) .Marland Kitchen... 2 by mouth qd    Ecotrin 325 Mg Tbec (Aspirin)  Labs Reviewed: HgBA1c: 6.5 (10/17/2007)   Creat: 1.5 (10/17/2007)    stable overall by hx and exam, ok to continue meds/tx as is - declines further labs today  Problem # 3:  HYPERLIPIDEMIA (ICD-272.4)  Labs Reviewed: Chol: 221 (10/17/2007)   HDL: 31.3 (10/17/2007)   LDL: DEL (10/17/2007)   TG: 230 (10/17/2007) SGOT: 22 (10/17/2007)   SGPT: 20 (10/17/2007) stable overall by hx and exam, ok to continue meds/tx as is - decline change in tx today  Complete Medication List: 1)  Allopurinol 100 Mg Tabs (Allopurinol) .... 2 tabs by mouth every other day,1 by mouth qod 2)  Lisinopril 40 Mg Tabs (Lisinopril) .Marland Kitchen.. 1 by mouth once daily 3)  Furosemide 40 Mg Tabs (Furosemide) .Marland Kitchen.. 1 by mouth qd 4)  Lisinopril 20 Mg Tabs (Lisinopril) .... 2 by mouth qd 5)  Ecotrin 325 Mg Tbec (Aspirin) 6)  Azithromycin 250 Mg Tabs (Azithromycin) .... 2po qd for 1 day, then 1po qd for 4days, then stop 7)  Darvocet-n 100 100-650 Mg Tabs (Propoxyphene n-apap) .Marland Kitchen.. 1po two times a day prn   Patient Instructions: 1)  stop the atenolol 2)  re-start the lisinopril 40 mg per day 3)  Continue all medications that you may have been taking previously 4)  followup late in the day next monday nov 2   Prescriptions: LISINOPRIL 40 MG TABS (LISINOPRIL) 1 by mouth once daily  #30 x 11   Entered and Authorized by:   Biagio Borg MD   Signed by:   Biagio Borg MD on 04/08/2008   Method used:   Electronically to        [facility] (retail)       [place]  [place], [place]  [address]       Ph: [phone]       Fax: [phone]   RxID:   [id number]  ]

## 2010-07-15 NOTE — Assessment & Plan Note (Signed)
Summary: FU PER DR Floria Brandau/NWS $50   Vital Signs:  Patient Profile:   77 Years Old Female Weight:      248.2 pounds O2 Sat:      93 % O2 treatment:    Room Air Temp:     98.2 degrees F oral Pulse rate:   79 / minute BP sitting:   144 / 70  (right arm) Cuff size:   large  Vitals Entered By: Sherlean Foot CMA (April 17, 2008 1:45 PM)                 Chief Complaint:  f/u.  History of Present Illness: BP at home per nurse has  been < 140/90, tolerating meds well, no new complaints, no CP , sob, orthopnea, pnd or LE edema    Updated Prior Medication List: ALLOPURINOL 100 MG  TABS (ALLOPURINOL) 2 tabs by mouth every other day,1 by mouth qod FUROSEMIDE 40 MG  TABS (FUROSEMIDE) 1 by mouth qd LISINOPRIL 20 MG  TABS (LISINOPRIL) 2 by mouth qd ECOTRIN 325 MG  TBEC (ASPIRIN)  AZITHROMYCIN 250 MG  TABS (AZITHROMYCIN) 2po qd for 1 day, then 1po qd for 4days, then stop DARVOCET-N 100 100-650 MG  TABS (PROPOXYPHENE N-APAP) 1po two times a day prn DILTIAZEM HCL CR 240 MG XR24H-CAP (DILTIAZEM HCL) 1 by mouth once daily  Current Allergies (reviewed today): ! SULFA ! * CODIENE ! * ZOCOR * CRESTOR PERCOCET  Past Medical History:    Reviewed history from 01/09/2008 and no changes required:       Anxiety       Diverticulosis, colon       Gout       Hyperlipidemia       Hypertension       Colonic polyps, hx of       Cerebrovascular accident, hx of - right thalamic 12/03       IBS       Peripheral neuropathy       Diabetes mellitus, type II  Past Surgical History:    Reviewed history from 10/17/2007 and no changes required:       Benign pelvic bone tumor removal       Laparotomy with repair of colonic perforation - 1995       Endoscopic retrograde cholangiopancreatography with biliary spincterotomy       Hysterectomy       s/p right hip arthroplasty for DJD   Social History:    Reviewed history from 10/17/2007 and no changes required:       Married       3 children - 1  deceased with meningitis       Never Smoked       Alcohol use-no       semi- retired Dietitian    Review of Systems       all otherwise negative    Physical Exam  General:     alert and overweight-appearing.   Head:     Normocephalic and atraumatic without obvious abnormalities. No apparent alopecia or balding. Eyes:     No corneal or conjunctival inflammation noted. EOMI. Perrla. F Ears:     External ear exam shows no significant lesions or deformities.  Otoscopic examination reveals clear canals, tympanic membranes are intact bilaterally without bulging, retraction, inflammation or discharge. Hearing is grossly normal bilaterally. Nose:     External nasal examination shows no deformity or inflammation. Nasal mucosa are pink and moist without lesions or exudates.  Mouth:     Oral mucosa and oropharynx without lesions or exudates.  Teeth in good repair. Neck:     No deformities, masses, or tenderness noted. Lungs:     Normal respiratory effort, chest expands symmetrically. Lungs are clear to auscultation, no crackles or wheezes. Heart:     Normal rate and regular rhythm. S1 and S2 normal without gallop, murmur, click, rub or other extra sounds. Extremities:     no edema, no ulcers     Impression & Recommendations:  Problem # 1:  HYPERTENSION (ICD-401.9)  The following medications were removed from the medication list:    Lisinopril 40 Mg Tabs (Lisinopril) .Marland Kitchen... 1 by mouth once daily  Her updated medication list for this problem includes:    Furosemide 40 Mg Tabs (Furosemide) .Marland Kitchen... 1 by mouth qd    Lisinopril 20 Mg Tabs (Lisinopril) .Marland Kitchen... 2 by mouth qd    Diltiazem Hcl Cr 240 Mg Xr24h-cap (Diltiazem hcl) .Marland Kitchen... 1 by mouth once daily stable overall by hx and exam, ok to continue meds/tx as is   Complete Medication List: 1)  Allopurinol 100 Mg Tabs (Allopurinol) .... 2 tabs by mouth every other day,1 by mouth qod 2)  Furosemide 40 Mg Tabs (Furosemide) .Marland Kitchen.. 1 by mouth  qd 3)  Lisinopril 20 Mg Tabs (Lisinopril) .... 2 by mouth qd 4)  Ecotrin 325 Mg Tbec (Aspirin) 5)  Azithromycin 250 Mg Tabs (Azithromycin) .... 2po qd for 1 day, then 1po qd for 4days, then stop 6)  Darvocet-n 100 100-650 Mg Tabs (Propoxyphene n-apap) .Marland Kitchen.. 1po two times a day prn 7)  Diltiazem Hcl Cr 240 Mg Xr24h-cap (Diltiazem hcl) .Marland Kitchen.. 1 by mouth once daily   Patient Instructions: 1)  Continue all medications that you may have been taking previously 2)  Please schedule a follow-up appointment in 6 months. 3)  Check your Blood Pressure regularly. If it is above 140/90: you should make an appointment. sooner   ]

## 2010-07-15 NOTE — Progress Notes (Signed)
Summary: pt refused referral Appt   Phone Note Call from Patient Call back at Home Phone [phone]   Caller: Patient Call For: Biagio Borg MD Summary of Call: Appt was made for pt With Dr. Henrene Pastor ([facility]) on 02-10-2009 @3 :45pm ..called pt to inform pt states she does not need  this appt ,  pt refused appt. Initial call taken by: Nonah Mattes,  January 22, 2009 8:34 AM  Follow-up for Phone Call        noted Follow-up by: Biagio Borg MD,  January 22, 2009 12:09 PM

## 2010-07-15 NOTE — Progress Notes (Signed)
  Phone Note Other Incoming   Summary of Call: recieved fax today indicating pneumobilia without hx of sphincterotomy - will refer to GI for further eval and r/o fistula Initial call taken by: Biagio Borg MD,  January 21, 2009 5:28 PM  New Problems: BILIARY TRACT DISORDER (ICD-576.9)   New Problems: BILIARY TRACT DISORDER (ICD-576.9)  Appended Document:  referaal done as above -   denae - to call pt to inform   Appended Document:  per above phone note when [facility] called pt about ref pt refused stated she does not need the referral

## 2010-07-15 NOTE — Miscellaneous (Signed)
  Clinical Lists Changes  Medications: Added new medication of LISINOPRIL 20 MG  TABS (LISINOPRIL) 1 by mouth qd - Signed Rx of LISINOPRIL 20 MG  TABS (LISINOPRIL) 1 by mouth qd;  #30 x 5;  Signed;  Entered by: Elveria Royals;  Authorized by: Biagio Borg MD;  Method used: Electronic    Prescriptions: LISINOPRIL 20 MG  TABS (LISINOPRIL) 1 by mouth qd  #30 x 5   Entered by:   Elveria Royals   Authorized by:   Biagio Borg MD   Signed by:   Elveria Royals on 10/10/2007   Method used:   Electronically sent to ...       [facility]       [address]       [place], [place]  [address]       Ph: [phone]       Fax: [phone]   RxID:   [id number]

## 2010-07-15 NOTE — Miscellaneous (Signed)
  Clinical Lists Changes 

## 2010-07-15 NOTE — Progress Notes (Signed)
Summary: FYI--[facility]  Phone Note From Other Clinic   Caller: Sandy--[facility] Summary of Call: FYI--Sandy was calling to let MD know that the patient is there due to Total Right Knee Replacement. *Per Sandy--No call back from MD required. Initial call taken by: Ernestene Mention,  July 14, 2009 10:51 AM  Follow-up for Phone Call        noted Follow-up by: Biagio Borg MD,  July 14, 2009 12:40 PM

## 2010-07-15 NOTE — Assessment & Plan Note (Signed)
Summary: cold symptoms and surgery clearance d/t---stc   Vital Signs:  Patient profile:   77 year old female Height:      63 inches Weight:      245 pounds BMI:     43.56 O2 Sat:      98 % on Room air Temp:     97.7 degrees F oral Pulse rate:   71 / minute BP sitting:   134 / 68  (left arm) Cuff size:   regular  Vitals Entered ByShirlean Mylar Ewing (June 17, 2009 8:05 AM)  O2 Flow:  Room air  Preventive Care Screening  Colonoscopy:    Date:  04/15/2003    Next Due:  04/2013    Results:  Hyperplastic Polyp   Last Pneumovax:    Date:  06/17/2009    Results:  Pneumovax  Last Tetanus Booster:    Date:  06/17/2009    Results:  Td     declines flu shot  CC: congestion,cough,surgery clearance/RE   CC:  congestion, cough, and surgery clearance/RE.  History of Present Illness: here for evaluation; is due for right knee TKA jan 31 per dr Alda Berthold;  last stress test (adenosine stress) done dec 2003 complicated  by cva 2 wks after and she refuses further stress testing;  Pt denies CP, sob, doe, wheezing, orthopnea, pnd, worsening LE edema, palps, dizziness or syncope, except for ST , fever and prod cough incidental for the lsat 3 days with headache and malaise.  s/p left TKA oct 2009 without complication with good recovery and rehab.  Pt denies new neuro symptoms such as headache, facial or extremity weakness Pt denies polydipsia, polyuria, or low sugar symptoms such as shakiness improved with eating.  Overall good compliance with meds, trying to follow low chol, DM diet, wt stable, little excercise however Wt down 3 lbs overall since last visit.    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) 4)  Uri  (ICD-465.9) 5)  Diabetes Mellitus, Type II  (ICD-250.00) 6)  Preoperative Examination  (ICD-V72.84) 7)  Peripheral Neuropathy  (ICD-356.9) 8)  Fatigue  (ICD-780.79) 9)  Sinusitis- Acute-nos   (ICD-461.9) 10)  Cerebrovascular Accident, Hx of  (ICD-V12.50) 11)  Colonic Polyps, Hx of  (ICD-V12.72) 12)  Hypertension  (ICD-401.9) 13)  Hyperlipidemia  (ICD-272.4) 14)  Gout  (ICD-274.9) 15)  Diverticulosis, Colon  (ICD-562.10) 16)  Anxiety  (ICD-300.00) 17)  Polyp, Colon  (ICD-211.3) 18)  Irritable Bowel, Predominantly Constipation  (ICD-564.1) 19)  Cva  (ICD-434.91)  Medications Prior to Update: 1)  Allopurinol 100 Mg  Tabs (Allopurinol) .... 2 Tabs By Mouth Every Other Day,1 By Mouth Qod 2)  Furosemide 40 Mg  Tabs (Furosemide) .Marland Kitchen.. 1 By Mouth Qd 3)  Lisinopril 20 Mg  Tabs (Lisinopril) .... 2 By Mouth Qd 4)  Ecotrin 325 Mg  Tbec (Aspirin) 5)  Azithromycin 250 Mg  Tabs (Azithromycin) .... 2po Qd For 1 Day, Then 1po Qd For 4days, Then Stop 6)  Darvocet-N 100 100-650 Mg  Tabs (Propoxyphene N-Apap) .Marland Kitchen.. 1po Two Times A Day Prn 7)  Diltiazem Hcl Cr 240 Mg Xr24h-Cap (Diltiazem Hcl) .Marland Kitchen.. 1 By Mouth Once Daily  Current Medications (verified): 1)  Allopurinol 100 Mg  Tabs (Allopurinol) .... 2 Tabs By Mouth Every Other Day,1 By Mouth Qod 2)  Furosemide 40 Mg  Tabs (Furosemide) .Marland Kitchen.. 1 By Mouth Qd 3)  Lisinopril 20 Mg  Tabs (Lisinopril) .Marland KitchenMarland KitchenMarland Kitchen  2 By Mouth Qd 4)  Ecotrin 325 Mg  Tbec (Aspirin) 5)  Cephalexin 500 Mg Caps (Cephalexin) .Marland Kitchen.. 1 By Mouth Three Times A Day 6)  Prednisone 10 Mg Tabs (Prednisone) .... 3po Qd For 3days, Then 2po Qd For 3days, Then 1po Qd For 3days, Then Stop 7)  Diltiazem Hcl Cr 240 Mg Xr24h-Cap (Diltiazem Hcl) .Marland Kitchen.. 1 By Mouth Once Daily 8)  Tessalon Perles 100 Mg Caps (Benzonatate) .Marland Kitchen.. 1 - 2 By Mouth Three Times A Day As Needed Cough  Allergies (verified): 1)  ! Sulfa 2)  ! * Codiene 3)  ! * Zocor 4)  * Crestor 5)  Percocet  Past History:  Past Medical History: Last updated: 01/09/2008 Anxiety Diverticulosis, colon Gout Hyperlipidemia Hypertension Colonic polyps, hx of Cerebrovascular accident, hx of - right thalamic 12/03 IBS Peripheral  neuropathy Diabetes mellitus, type II  Past Surgical History: Last updated: 10/17/2007 Benign pelvic bone tumor removal Laparotomy with repair of colonic perforation - 1995 Endoscopic retrograde cholangiopancreatography with biliary spincterotomy Hysterectomy s/p right hip arthroplasty for DJD  Family History: Last updated: 10/17/2007 brother with prostate cancer and dementia 2 sister with breast cancer  Social History: Last updated: 10/17/2007 Married 3 children - 1 deceased with meningitis Never Smoked Alcohol use-no semi- retired Dietitian  Risk Factors: Smoking Status: never (10/17/2007)  Review of Systems  The patient denies anorexia, fever, weight loss, weight gain, vision loss, decreased hearing, hoarseness, chest pain, syncope, dyspnea on exertion, peripheral edema, prolonged cough, headaches, hemoptysis, abdominal pain, melena, hematochezia, severe indigestion/heartburn, hematuria, incontinence, muscle weakness, suspicious skin lesions, transient blindness, difficulty walking, depression, unusual weight change, abnormal bleeding, enlarged lymph nodes, and angioedema.         all otherwise negative per pt - 12 system review done except for ongoing fatigue without hypersomnolence  Physical Exam  General:  alert and overweight-appearing.   Head:  normocephalic and atraumatic.   Eyes:  vision grossly intact, pupils equal, and pupils round.   Ears:  bilat tm's red, sinus nontender Nose:  nasal dischargemucosal pallor and mucosal erythema.   Mouth:  pharyngeal erythema and fair dentition.   Neck:  supple and cervical lymphadenopathy.   Lungs:  normal respiratory effort, R wheezes, and L wheezes.   Heart:  normal rate and regular rhythm.   Abdomen:  soft, non-tender, and normal bowel sounds.   Msk:  no joint tenderness and no joint swelling.  except for severe right  bony changes Extremities:  trace edema bilat Neurologic:  alert & oriented X3, cranial nerves II-XII  intact, and strength normal in all extremities.     Impression & Recommendations:  Problem # 1:  BRONCHITIS-ACUTE (ICD-466.0)  Her updated medication list for this problem includes:    Cephalexin 500 Mg Caps (Cephalexin) .Marland Kitchen... 1 by mouth three times a day    Tessalon Perles 100 Mg Caps (Benzonatate) .Marland Kitchen... 1 - 2 by mouth three times a day as needed cough  Orders: T-2 View CXR, Same Day (71020.5TC) treat as above, f/u any worsening signs or symptoms , to check cxr, r/o pna  Problem # 2:  WHEEZING (ICD-786.07) likely due to above - for prednisone burst and taper off  Problem # 3:  DIABETES MELLITUS, TYPE II (ICD-250.00)  Her updated medication list for this problem includes:    Lisinopril 20 Mg Tabs (Lisinopril) .Marland Kitchen... 2 by mouth qd    Ecotrin 325 Mg Tbec (Aspirin)  Orders: TLB-BMP (Basic Metabolic Panel-BMET) (99991111) TLB-A1C / Hgb A1C (Glycohemoglobin) (83036-A1C) TLB-Lipid Panel (  80061-LIPID) TLB-Microalbumin/Creat Ratio, Urine (82043-MALB)  Labs Reviewed: Creat: 1.5 (10/17/2007)    Reviewed HgBA1c results: 6.5 (10/17/2007) stable overall by hx and exam, ok to continue meds/tx as is , Pt to cont DM diet, excercise, wt loss efforts; to check labs today   Problem # 4:  FATIGUE (ICD-780.79)  exam benign, to check labs below; follow with expectant management   Orders: T-Vitamin D (25-Hydroxy) [id number]) TLB-CBC Platelet - w/Differential (85025-CBCD) TLB-Hepatic/Liver Function Pnl (80076-HEPATIC) TLB-TSH (Thyroid Stimulating Hormone) (84443-TSH) TLB-Sedimentation Rate (ESR) (85652-ESR) TLB-IBC Pnl (Iron/FE;Transferrin) (83550-IBC) TLB-B12 + Folate Pnl (82746_82607-B12/FOL) TLB-Udip ONLY (81003-UDIP)  Problem # 5:  PREOPERATIVE EXAMINATION (ICD-V72.84) ECG reviewed;  pt at mod risk due to co-morbidities, but acceptable risk for surgury - OK for right Knee TKA as planned Orders: EKG w/ Interpretation (93000)  Complete Medication List: 1)  Allopurinol 100 Mg  Tabs (Allopurinol) .... 2 tabs by mouth every other day,1 by mouth qod 2)  Furosemide 40 Mg Tabs (Furosemide) .Marland Kitchen.. 1 by mouth qd 3)  Lisinopril 20 Mg Tabs (Lisinopril) .... 2 by mouth qd 4)  Ecotrin 325 Mg Tbec (Aspirin) 5)  Cephalexin 500 Mg Caps (Cephalexin) .Marland Kitchen.. 1 by mouth three times a day 6)  Prednisone 10 Mg Tabs (Prednisone) .... 3po qd for 3days, then 2po qd for 3days, then 1po qd for 3days, then stop 7)  Diltiazem Hcl Cr 240 Mg Xr24h-cap (Diltiazem hcl) .Marland Kitchen.. 1 by mouth once daily 8)  Tessalon Perles 100 Mg Caps (Benzonatate) .Marland Kitchen.. 1 - 2 by mouth three times a day as needed cough  Other Orders: TD Toxoids IM 7 YR + [id number]) Admin 1st Vaccine [id number]) Pneumococcal Vaccine [id number]) Admin of Any Addtl Vaccine [id number])  Patient Instructions: 1)  you had the tetanus shot today, and the pneumonia shot 2)  your EKG was good today 3)  Please go to Radiology in the basement level for your X-Ray today  4)  Please go to the Lab in the basement for your blood and/or urine tests today 5)  Please take all new medications as prescribed 6)  Continue all previous medications as before this visit  7)  Please schedule a follow-up appointment in 6 months. Prescriptions: TESSALON PERLES 100 MG CAPS (BENZONATATE) 1 - 2 by mouth three times a day as needed cough  #50 x 1   Entered and Authorized by:   Biagio Borg MD   Signed by:   Biagio Borg MD on 06/17/2009   Method used:   Print then Give to Patient   RxID:   [id number] PREDNISONE 10 MG TABS (PREDNISONE) 3po qd for 3days, then 2po qd for 3days, then 1po qd for 3days, then stop  #18 x 0   Entered and Authorized by:   Biagio Borg MD   Signed by:   Biagio Borg MD on 06/17/2009   Method used:   Print then Give to Patient   [id number] CEPHALEXIN 500 MG CAPS (CEPHALEXIN) 1 by mouth three times a day  #30 x 0   Entered and Authorized by:   Biagio Borg MD   Signed by:   Biagio Borg MD on 06/17/2009   Method used:   Print then  Give to Patient   [id number]    Immunizations Administered:  Tetanus Vaccine:    Vaccine Type: Td    Site: right deltoid    Mfr: [place]    Dose: 0.5 ml    Route: IM  Given by: Shirlean Mylar Ewing    Exp. Date: 04/07/2011    Lot #: [id number]    VIS given: 05/01/07 version given June 17, 2009.  Pneumonia Vaccine:    Vaccine Type: Pneumovax    Site: left deltoid    Mfr: [employer]    Dose: 0.5 ml    Route: IM    Given by: Robin Ewing    Exp. Date: 07/10/2010    Lot #: [id number]    VIS given: 01/09/96 version given June 17, 2009.

## 2010-07-15 NOTE — Miscellaneous (Signed)
  Clinical Lists Changes  Appended Document:     Clinical Lists Changes  Medications: Added new medication of ALLOPURINOL 100 MG  TABS (ALLOPURINOL) 2 tabs by mouth every other day,1 by mouth qod - Signed Added new medication of DILTIAZEM HCL CR 240 MG  CP24 (DILTIAZEM HCL) 1 by mouth qd - Signed Added new medication of FUROSEMIDE 40 MG  TABS (FUROSEMIDE) 1 by mouth qd Rx of ALLOPURINOL 100 MG  TABS (ALLOPURINOL) 2 tabs by mouth every other day,1 by mouth qod;  #45 x 5;  Signed;  Entered by: Elveria Royals;  Authorized by: Biagio Borg MD;  Method used: Telephoned to [facility] [place], [place], [place], [place]  [address], Ph: [phone], Fax: [phone] Rx of DILTIAZEM HCL CR 240 MG  CP24 (DILTIAZEM HCL) 1 by mouth qd;  #30 x 5;  Signed;  Entered by: Elveria Royals;  Authorized by: Biagio Borg MD;  Method used: Telephoned to [facility]*, [address], [place], [place], [place]  [address], Ph: [phone], Fax: [phone]    Prescriptions: DILTIAZEM HCL CR 240 MG  CP24 (DILTIAZEM HCL) 1 by mouth qd  #30 x 5   Entered by:   Elveria Royals   Authorized by:   Biagio Borg MD   Signed by:   Elveria Royals on 08/24/2007   Method used:   Telephoned to ...       [facility]       [address]       [place], [place]  [address]       Ph: [phone]       Fax: [phone]   RxID:   [id number] ALLOPURINOL 100 MG  TABS (ALLOPURINOL) 2 tabs by mouth every other day,1 by mouth qod  #45 x 5   Entered by:   Elveria Royals   Authorized by:   Biagio Borg MD   Signed by:   Elveria Royals on 08/24/2007   Method used:   Telephoned to ...       [facility]       [address]       [place], [place]  [address]       Ph: [phone]       Fax: [phone]   RxID:   [id number]

## 2010-07-15 NOTE — Assessment & Plan Note (Signed)
Summary: knee surgery clearance per pt---$50---stc   Vital Signs:  Patient Profile:   77 Years Old Female Weight:      256 pounds Temp:     97.1 degrees F oral Pulse rate:   74 / minute BP sitting:   130 / 76  (left arm) Cuff size:   large  Vitals Entered By: Julious Payer (January 09, 2008 3:47 PM)                 Chief Complaint:  knee surgery clearance.  History of Present Illness: overall doing well, no complaints; for left knee surgury per dr Wynelle Link Mar 17 2008; no CP or other CV symptoms such as orthopnea, pnd, le edema; needs clearance; also denies polys or low sugars; currnetly walks with cane    Updated Prior Medication List: ALLOPURINOL 100 MG  TABS (ALLOPURINOL) 2 tabs by mouth every other day,1 by mouth qod DILTIAZEM HCL CR 240 MG  CP24 (DILTIAZEM HCL) 1 by mouth qd FUROSEMIDE 40 MG  TABS (FUROSEMIDE) 1 by mouth qd LISINOPRIL 20 MG  TABS (LISINOPRIL) 2 by mouth qd ECOTRIN 325 MG  TBEC (ASPIRIN)  AZITHROMYCIN 250 MG  TABS (AZITHROMYCIN) 2po qd for 1 day, then 1po qd for 4days, then stop DARVOCET-N 100 100-650 MG  TABS (PROPOXYPHENE N-APAP) 1po two times a day prn  Current Allergies (reviewed today): ! SULFA ! * CODIENE ! * ZOCOR * CRESTOR  Past Medical History:    Reviewed history from 10/17/2007 and no changes required:       Anxiety       Diverticulosis, colon       Gout       Hyperlipidemia       Hypertension       Colonic polyps, hx of       Cerebrovascular accident, hx of - right thalamic 12/03       IBS       Peripheral neuropathy       Diabetes mellitus, type II  Past Surgical History:    Reviewed history from 10/17/2007 and no changes required:       Benign pelvic bone tumor removal       Laparotomy with repair of colonic perforation - 1995       Endoscopic retrograde cholangiopancreatography with biliary spincterotomy       Hysterectomy       s/p right hip arthroplasty for DJD   Family History:    Reviewed history from 10/17/2007 and no  changes required:       brother with prostate cancer and dementia       2 sister with breast cancer  Social History:    Reviewed history from 10/17/2007 and no changes required:       Married       3 children - 1 deceased with meningitis       Never Smoked       Alcohol use-no       semi- retired Dietitian    Review of Systems       all otherwise negative    Physical Exam  General:     alert and overweight-appearing.   Head:     Normocephalic and atraumatic without obvious abnormalities. No apparent alopecia or balding. Eyes:     No corneal or conjunctival inflammation noted. EOMI. Perrla.  Ears:     External ear exam shows no significant lesions or deformities.  Otoscopic examination reveals clear canals, tympanic membranes are intact bilaterally  without bulging, retraction, inflammation or discharge. Hearing is grossly normal bilaterally. Nose:     External nasal examination shows no deformity or inflammation. Nasal mucosa are pink and moist without lesions or exudates. Mouth:     Oral mucosa and oropharynx without lesions or exudates.  Teeth in good repair. Neck:     No deformities, masses, or tenderness noted. Lungs:     Normal respiratory effort, chest expands symmetrically. Lungs are clear to auscultation, no crackles or wheezes. Heart:     Normal rate and regular rhythm. S1 and S2 normal without gallop, murmur, click, rub or other extra sounds. Abdomen:     Bowel sounds positive,abdomen soft and non-tender without masses, organomegaly or hernias noted. Msk:     marked left knee crepitus and small effusion, right knee milder crepitus Extremities:     no edema, no ulcers  Neurologic:     alert & oriented X3 and cranial nerves II-XII intact.      Impression & Recommendations:  Problem # 1:  PREOPERATIVE EXAMINATION (ICD-V72.84) overall doing well, labs from 5/09 reviewed; I suggested she have an adenosine stress test but she is adamant she does not want to  do this as she had CVA 2 wks after the last in 2003, will further refer to dr wall to see about further need for preop eval Orders: Cardiology Referral (Cardiology)   Problem # 2:  DIABETES MELLITUS, TYPE II (ICD-250.00)  Her updated medication list for this problem includes:    Lisinopril 20 Mg Tabs (Lisinopril) .Marland Kitchen... 2 by mouth qd    Ecotrin 325 Mg Tbec (Aspirin) new onset, diet for now, d/w pt who also declines to acknowledge this is a new problem for her  Problem # 3:  HYPERTENSION (ICD-401.9)  Her updated medication list for this problem includes:    Diltiazem Hcl Cr 240 Mg Cp24 (Diltiazem hcl) .Marland Kitchen... 1 by mouth qd    Furosemide 40 Mg Tabs (Furosemide) .Marland Kitchen... 1 by mouth qd    Lisinopril 20 Mg Tabs (Lisinopril) .Marland Kitchen... 2 by mouth qd  BP today: 130/76 Prior BP: 150/69 (10/17/2007)  Labs Reviewed: Creat: 1.5 (10/17/2007) Chol: 221 (10/17/2007)   HDL: 31.3 (10/17/2007)   LDL: DEL (10/17/2007)   TG: 230 (10/17/2007) stable overall by hx and exam, ok to continue meds/tx as is   Complete Medication List: 1)  Allopurinol 100 Mg Tabs (Allopurinol) .... 2 tabs by mouth every other day,1 by mouth qod 2)  Diltiazem Hcl Cr 240 Mg Cp24 (Diltiazem hcl) .Marland Kitchen.. 1 by mouth qd 3)  Furosemide 40 Mg Tabs (Furosemide) .Marland Kitchen.. 1 by mouth qd 4)  Lisinopril 20 Mg Tabs (Lisinopril) .... 2 by mouth qd 5)  Ecotrin 325 Mg Tbec (Aspirin) 6)  Azithromycin 250 Mg Tabs (Azithromycin) .... 2po qd for 1 day, then 1po qd for 4days, then stop 7)  Darvocet-n 100 100-650 Mg Tabs (Propoxyphene n-apap) .Marland Kitchen.. 1po two times a day prn   Patient Instructions: 1)  Continue all medications that you may have been taking previously 2)  You will be contacted about the referral(s) to: Dr Wall/cardiology 3)  Please schedule a follow-up appointment in 6 months   ]

## 2010-07-15 NOTE — Assessment & Plan Note (Signed)
Summary: F/U GOUT/MEDS/JK   Vital Signs:  Patient Profile:   77 Years Old Female Weight:      257 pounds Temp:     97.7 degrees F oral Pulse rate:   75 / minute BP sitting:   150 / 69  (right arm) Cuff size:   regular  Pt. in pain?   no  Vitals Entered By: Elveria Royals (Oct 17, 2007 2:33 PM)                  Chief Complaint:  F/U on gout/sinus trouble.  History of Present Illness: only taking 1 lisinopril 20 per day instead of 2 pills (error on the last rx); overall o/w doing well, no specific c/o;s except for knee and hand DJD pains worse than ever now    Updated Prior Medication List: ALLOPURINOL 100 MG  TABS (ALLOPURINOL) 2 tabs by mouth every other day,1 by mouth qod DILTIAZEM HCL CR 240 MG  CP24 (DILTIAZEM HCL) 1 by mouth qd FUROSEMIDE 40 MG  TABS (FUROSEMIDE) 1 by mouth qd LISINOPRIL 20 MG  TABS (LISINOPRIL) 2 by mouth qd ECOTRIN 325 MG  TBEC (ASPIRIN)  AZITHROMYCIN 250 MG  TABS (AZITHROMYCIN) 2po qd for 1 day, then 1po qd for 4days, then stop DARVOCET-N 100 100-650 MG  TABS (PROPOXYPHENE N-APAP) 1po two times a day prn  Current Allergies (reviewed today): ! SULFA ! * CODIENE ! * ZOCOR * CRESTOR  Past Medical History:    Reviewed history from 02/10/2007 and no changes required:       Anxiety       Diverticulosis, colon       Gout       Hyperlipidemia       Hypertension       Colonic polyps, hx of       Cerebrovascular accident, hx of       IBS  Past Surgical History:    Reviewed history from 02/10/2007 and no changes required:       Benign pelvic bone tumor removal       Laparotomy with repair of colonic perforation       Endoscopic retrograde cholangiopancreatography with biliary spincterotomy       Hysterectomy   Family History:    Reviewed history and no changes required:       brother with prostate cancer and dementia       2 sister with breast cancer  Social History:    Reviewed history and no changes required:       Married      3 children - 1 deceased with meningitis       Never Smoked       Alcohol use-no       semi- retired Dietitian   Risk Factors:  Tobacco use:  never Alcohol use:  no   Review of Systems  The patient denies anorexia, fever, weight loss, weight gain, vision loss, decreased hearing, hoarseness, chest pain, syncope, dyspnea on exhertion, peripheral edema, prolonged cough, hemoptysis, abdominal pain, melena, hematochezia, severe indigestion/heartburn, hematuria, incontinence, genital sores, muscle weakness, suspicious skin lesions, transient blindness, difficulty walking, depression, unusual weight change, abnormal bleeding, enlarged lymph nodes, angioedema, and breast masses.         all otherwise negative    Physical Exam  General:     obese but frail, walks with cane, mild ill appearing Head:     Normocephalic and atraumatic without obvious abnormalities. No apparent alopecia or balding. Eyes:  No corneal or conjunctival inflammation noted. EOMI. Perrla. Ears:     bilat tm's red, sinus tender bilat Nose:     nasal dischargemucosal pallor and mucosal erythema.   Mouth:     pharyngeal erythema and fair dentition.   Neck:     No deformities, masses, or tenderness noted. Lungs:     Normal respiratory effort, chest expands symmetrically. Lungs are clear to auscultation, no crackles or wheezes. Heart:     Normal rate and regular rhythm. S1 and S2 normal without gallop, murmur, click, rub or other extra sounds. Abdomen:     Bowel sounds positive,abdomen soft and non-tender without masses, organomegaly or hernias noted. Msk:     marked knee crepitus without effusion, severe hand djd changes to mos DIP and PIP Extremities:     no edema, no ulcers     Impression & Recommendations:  Problem # 1:  SINUSITIS- ACUTE-NOS (ICD-461.9)  Her updated medication list for this problem includes:    Azithromycin 250 Mg Tabs (Azithromycin) .Marland Kitchen... 2po qd for 1 day, then 1po qd for  4days, then stop Instructed on treatment. Call if symptoms persist or worsen.   Problem # 2:  HYPERTENSION (ICD-401.9)  Her updated medication list for this problem includes:    Diltiazem Hcl Cr 240 Mg Cp24 (Diltiazem hcl) .Marland Kitchen... 1 by mouth qd    Furosemide 40 Mg Tabs (Furosemide) .Marland Kitchen... 1 by mouth qd    Lisinopril 20 Mg Tabs (Lisinopril) .Marland Kitchen... 2 by mouth qd  BP today: 150/69 Prior BP: 155/84 (09/06/2006)  mild elev today - to re-start correct meds., check BP at home   Problem # 3:  HYPERLIPIDEMIA (ICD-272.4) Assessment: Comment Only intol of crestor and zocor in the past; consider pravachol Orders: TLB-Lipid Panel (80061-LIPID)   Problem # 4:  GOUT (ICD-274.9)  Her updated medication list for this problem includes:    Allopurinol 100 Mg Tabs (Allopurinol) .Marland Kitchen... 2 tabs by mouth every other day,1 by mouth qod Orders: TLB-Uric Acid, Blood (84550-URIC)   Problem # 5:  FATIGUE (ICD-780.79) exan benign, check routine labs Orders: TLB-BMP (Basic Metabolic Panel-BMET) (99991111) TLB-CBC Platelet - w/Differential (85025-CBCD) TLB-Hepatic/Liver Function Pnl (80076-HEPATIC) TLB-TSH (Thyroid Stimulating Hormone) (84443-TSH)   Complete Medication List: 1)  Allopurinol 100 Mg Tabs (Allopurinol) .... 2 tabs by mouth every other day,1 by mouth qod 2)  Diltiazem Hcl Cr 240 Mg Cp24 (Diltiazem hcl) .Marland Kitchen.. 1 by mouth qd 3)  Furosemide 40 Mg Tabs (Furosemide) .Marland Kitchen.. 1 by mouth qd 4)  Lisinopril 20 Mg Tabs (Lisinopril) .... 2 by mouth qd 5)  Ecotrin 325 Mg Tbec (Aspirin) 6)  Azithromycin 250 Mg Tabs (Azithromycin) .... 2po qd for 1 day, then 1po qd for 4days, then stop 7)  Darvocet-n 100 100-650 Mg Tabs (Propoxyphene n-apap) .Marland Kitchen.. 1po two times a day prn   Patient Instructions: 1)  you will have blood work today 2)  increase the lisinopril 20 mg to 2 pills per day 3)  continue all medications that you may have been taking previously 4)  Please schedule a follow-up appointment in 6  months.   Prescriptions: AZITHROMYCIN 250 MG  TABS (AZITHROMYCIN) 2po qd for 1 day, then 1po qd for 4days, then stop  #6 x 1   Entered and Authorized by:   Biagio Borg MD   Signed by:   Biagio Borg MD on 10/17/2007   Method used:   Electronically sent to ...       [facility]*  [address] [place], [place]  [address]       Ph: [phone]       Fax: [phone]   RxID:   [id number] LISINOPRIL 20 MG  TABS (LISINOPRIL) 2 by mouth qd  #180 x 3   Entered and Authorized by:   Biagio Borg MD   Signed by:   Biagio Borg MD on 10/17/2007   Method used:   Electronically sent to ...       [facility]       [place], [place]  [address]       Ph: [phone]       Fax: [phone]   RxID:   [id number] FUROSEMIDE 40 MG  TABS (FUROSEMIDE) 1 by mouth qd  #90 x 3   Entered and Authorized by:   Biagio Borg MD   Signed by:   Biagio Borg MD on 10/17/2007   Method used:   Electronically sent to ...       [facility]       [place], [place]  [address]       Ph: [phone]       Fax: [phone]   RxID:   [id number] DILTIAZEM HCL CR 240 MG  CP24 (DILTIAZEM HCL) 1 by mouth qd  #90 x 3   Entered and Authorized by:   Biagio Borg MD   Signed by:   Biagio Borg MD on 10/17/2007   Method used:   Electronically sent to ...       [facility]       [place], [place]  [address]       Ph: [phone]       Fax: [phone]   RxID:   [id number] ALLOPURINOL 100 MG  TABS (ALLOPURINOL) 2 tabs by mouth every other day,1 by mouth qod  #135 x 3   Entered and Authorized by:   Biagio Borg MD   Signed by:   Biagio Borg MD on 10/17/2007   Method used:   Electronically sent to ...       [facility]       [place], [place]  [address]       Ph:  [phone]       Fax: [phone]   RxID:   [id number]  ]

## 2010-08-17 ENCOUNTER — Other Ambulatory Visit: Payer: Self-pay | Admitting: Internal Medicine

## 2010-08-17 ENCOUNTER — Encounter: Payer: Self-pay | Admitting: Internal Medicine

## 2010-08-17 ENCOUNTER — Other Ambulatory Visit: Payer: Medicare Other

## 2010-08-17 ENCOUNTER — Ambulatory Visit (INDEPENDENT_AMBULATORY_CARE_PROVIDER_SITE_OTHER): Payer: Medicare Other | Admitting: Internal Medicine

## 2010-08-17 DIAGNOSIS — R259 Unspecified abnormal involuntary movements: Secondary | ICD-10-CM | POA: Insufficient documentation

## 2010-08-17 DIAGNOSIS — R5383 Other fatigue: Secondary | ICD-10-CM

## 2010-08-17 DIAGNOSIS — R5381 Other malaise: Secondary | ICD-10-CM

## 2010-08-17 DIAGNOSIS — E785 Hyperlipidemia, unspecified: Secondary | ICD-10-CM

## 2010-08-17 DIAGNOSIS — E119 Type 2 diabetes mellitus without complications: Secondary | ICD-10-CM

## 2010-08-17 DIAGNOSIS — M109 Gout, unspecified: Secondary | ICD-10-CM

## 2010-08-17 DIAGNOSIS — I10 Essential (primary) hypertension: Secondary | ICD-10-CM

## 2010-08-17 DIAGNOSIS — G609 Hereditary and idiopathic neuropathy, unspecified: Secondary | ICD-10-CM

## 2010-08-17 LAB — BASIC METABOLIC PANEL
BUN: 37 mg/dL — ABNORMAL HIGH (ref 6–23)
CO2: 32 mEq/L (ref 19–32)
Calcium: 9.4 mg/dL (ref 8.4–10.5)
Chloride: 104 mEq/L (ref 96–112)
Creatinine, Ser: 1.3 mg/dL — ABNORMAL HIGH (ref 0.4–1.2)
GFR: 44 mL/min — ABNORMAL LOW (ref >60.00)
Glucose, Bld: 98 mg/dL (ref 70–99)
Potassium: 4.7 mEq/L (ref 3.5–5.1)
Sodium: 143 mEq/L (ref 135–145)

## 2010-08-17 LAB — URINALYSIS, ROUTINE W REFLEX MICROSCOPIC
Bilirubin Urine: NEGATIVE
Hgb urine dipstick: NEGATIVE
Ketones, ur: NEGATIVE
Nitrite: POSITIVE
Specific Gravity, Urine: 1.025 (ref 1.000–1.030)
Total Protein, Urine: NEGATIVE
Urine Glucose: NEGATIVE
Urobilinogen, UA: 0.2 (ref 0.0–1.0)
pH: 5.5 (ref 5.0–8.0)

## 2010-08-17 LAB — CBC WITH DIFFERENTIAL/PLATELET
Basophils Absolute: 0 10*3/uL (ref 0.0–0.1)
Basophils Relative: 0.5 % (ref 0.0–3.0)
Eosinophils Absolute: 0.3 10*3/uL (ref 0.0–0.7)
Eosinophils Relative: 2.8 % (ref 0.0–5.0)
HCT: 43.6 % (ref 36.0–46.0)
Hemoglobin: 14.5 g/dL (ref 12.0–15.0)
Lymphocytes Relative: 22.7 % (ref 12.0–46.0)
Lymphs Abs: 2.2 10*3/uL (ref 0.7–4.0)
MCHC: 33.3 g/dL (ref 30.0–36.0)
MCV: 89.8 fl (ref 78.0–100.0)
Monocytes Absolute: 0.9 10*3/uL (ref 0.1–1.0)
Monocytes Relative: 9.4 % (ref 3.0–12.0)
Neutro Abs: 6.2 10*3/uL (ref 1.4–7.7)
Neutrophils Relative %: 64.6 % (ref 43.0–77.0)
Platelets: 214 10*3/uL (ref 150.0–400.0)
RBC: 4.86 Mil/uL (ref 3.87–5.11)
RDW: 16.6 % — ABNORMAL HIGH (ref 11.5–14.6)
WBC: 9.6 10*3/uL (ref 4.5–10.5)

## 2010-08-17 LAB — HEPATIC FUNCTION PANEL
ALT: 15 U/L (ref 0–35)
AST: 17 U/L (ref 0–37)
Albumin: 3.7 g/dL (ref 3.5–5.2)
Alkaline Phosphatase: 92 U/L (ref 39–117)
Bilirubin, Direct: 0.1 mg/dL (ref 0.0–0.3)
Total Bilirubin: 0.5 mg/dL (ref 0.3–1.2)

## 2010-08-17 LAB — LIPID PANEL
Cholesterol: 235 mg/dL — ABNORMAL HIGH (ref 0–200)
HDL: 39.4 mg/dL (ref >39.00)
Total CHOL/HDL Ratio: 6
Triglycerides: 182 mg/dL — ABNORMAL HIGH (ref 0.0–149.0)
VLDL: 36.4 mg/dL (ref 0.0–40.0)

## 2010-08-17 LAB — MICROALBUMIN / CREATININE URINE RATIO
Creatinine,U: 104.5 mg/dL
Microalb Creat Ratio: 2.7 mg/g (ref 0.0–30.0)
Microalb, Ur: 2.8 mg/dL — ABNORMAL HIGH (ref 0.0–1.9)

## 2010-08-17 LAB — TSH: TSH: 4.91 u[IU]/mL (ref 0.35–5.50)

## 2010-08-17 LAB — B12 AND FOLATE PANEL
Folate: 12 ng/mL (ref >5.9)
Vitamin B-12: 186 pg/mL — ABNORMAL LOW (ref 211–911)

## 2010-08-17 LAB — URIC ACID: Uric Acid, Serum: 7.8 mg/dL — ABNORMAL HIGH (ref 2.4–7.0)

## 2010-08-17 LAB — HEMOGLOBIN A1C: Hgb A1c MFr Bld: 6.8 % — ABNORMAL HIGH (ref 4.6–6.5)

## 2010-08-17 LAB — LDL CHOLESTEROL, DIRECT: Direct LDL: 175.2 mg/dL

## 2010-08-24 NOTE — Assessment & Plan Note (Signed)
Summary: FU ON FLUID PILLS /NWS   Vital Signs:  Patient profile:   77 year old female Height:      65 inches Weight:      250 pounds BMI:     41.75 O2 Sat:      95 % on Room air Temp:     97.8 degrees F oral Pulse rate:   67 / minute BP sitting:   142 / 70  (left arm) Cuff size:   large  Vitals Entered By: Shirlean Mylar [facility] CMA Deborra Medina) (August 17, 2010 1:53 PM)  O2 Flow:  Room air  CC: followup/RE   CC:  followup/RE.  History of Present Illness: here to f/u;  BP at home < 140/90;  Pt denies CP, worsening sob, doe, wheezing, orthopnea, pnd, worsening LE edema, palps, dizziness or syncope  Pt denies new neuro symptoms such as headache, facial or extremity weakness  Pt denies polydipsia, polyuria.  Overall good compliance with meds, trying to follow low chol diet, wt stable, little excercise however Overall good compliance with meds, and good tolerability.   Does have mult joints sore and still but no swelling, is chornic, mild tomod, walsk with cane, no recent falls, and s/p knee surgury feb 2011 and diudd well.  No fever, wt loss, night sweats, loss of appetite or other constitutional symptoms  Overall good compliance with meds, and good tolerability.  Denies worsening depressive symptoms, suicidal ideation, or panic.  Pt states good ability with ADL's, low fall risk, home safety reviewed and adequate, no significant change in hearing or vision, trying to follow lower chol diet, and occasionally active only with regular excercise.  Dilitazem in her plan is too expensive.   Preventive Screening-Counseling & Management      Drug Use:  no.    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) 3)  Uri  (ICD-465.9) 4)  Diabetes Mellitus, Type II  (ICD-250.00) 5)  Preoperative Examination  (ICD-V72.84) 6)  Peripheral Neuropathy  (ICD-356.9) 7)  Fatigue  (ICD-780.79) 8)  Sinusitis- Acute-nos  (ICD-461.9) 9)  Cerebrovascular Accident, Hx of   (ICD-V12.50) 10)  Colonic Polyps, Hx of  (ICD-V12.72) 11)  Hypertension  (ICD-401.9) 12)  Hyperlipidemia  (ICD-272.4) 13)  Gout  (ICD-274.9) 14)  Diverticulosis, Colon  (ICD-562.10) 15)  Anxiety  (ICD-300.00) 16)  Polyp, Colon  (ICD-211.3) 17)  Irritable Bowel, Predominantly Constipation  (ICD-564.1) 18)  Cva  (ICD-434.91)  Medications Prior to Update: 1)  Allopurinol 100 Mg  Tabs (Allopurinol) .... 2 Tabs By Mouth Every Other Day,1 By Mouth Qod 2)  Furosemide 40 Mg  Tabs (Furosemide) .Marland Kitchen.. 1 By Mouth Qd 3)  Lisinopril 20 Mg  Tabs (Lisinopril) .... 2 By Mouth Qd 4)  Ecotrin 325 Mg  Tbec (Aspirin) 5)  Cephalexin 500 Mg Caps (Cephalexin) .Marland Kitchen.. 1 By Mouth Three Times A Day 6)  Prednisone 10 Mg Tabs (Prednisone) .... 3po Qd For 3days, Then 2po Qd For 3days, Then 1po Qd For 3days, Then Stop 7)  Diltiazem Hcl Cr 240 Mg Xr24h-Cap (Diltiazem Hcl) .Marland Kitchen.. 1 By Mouth Once Daily 8)  Tessalon Perles 100 Mg Caps (Benzonatate) .Marland Kitchen.. 1 - 2 By Mouth Three Times A Day As Needed Cough  Current Medications (verified): 1)  Allopurinol 100 Mg  Tabs (Allopurinol) .... 2 Tabs By Mouth Every Other Day,1 By Mouth Qod 2)  Furosemide 40 Mg  Tabs (Furosemide) .Marland Kitchen.. 1 By Mouth Once Daily 3)  Lisinopril 20 Mg  Tabs (Lisinopril) .... 2 By Mouth Once Daily 4)  Ecotrin 325 Mg  Tbec (Aspirin) 5)  Amlodipine Besylate 5 Mg Tabs (Amlodipine Besylate) .Marland Kitchen.. 1 By Mouth Once Daily  Allergies (verified): 1)  ! Sulfa 2)  ! * Codiene 3)  ! * Zocor 4)  * Crestor 5)  Percocet  Past History:  Past Medical History: Last updated: 01/09/2008 Anxiety Diverticulosis, colon Gout Hyperlipidemia Hypertension Colonic polyps, hx of Cerebrovascular accident, hx of - right thalamic 12/03 IBS Peripheral neuropathy Diabetes mellitus, type II  Past Surgical History: Last updated: 10/17/2007 Benign pelvic bone tumor removal Laparotomy with repair of colonic perforation - 1995 Endoscopic retrograde cholangiopancreatography with  biliary spincterotomy Hysterectomy s/p right hip arthroplasty for DJD  Social History: Last updated: 08/17/2010 Married 3 children - 1 deceased with meningitis Never Smoked Alcohol 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   Eyes:  vision grossly intact, pupils equal, and pupils round.   Ears:  R ear normal and L ear normal.   Nose:  no external deformity and no nasal discharge.   Mouth:  no gingival abnormalities and pharynx pink and moist.   Neck:  supple and no masses.   Lungs:  normal respiratory effort and normal breath sounds.   Heart:  normal rate and regular rhythm.   Abdomen:  soft, non-tender, and normal bowel sounds.   Msk:  no joint tenderness and no joint swelling but is s/p bilat knee TKA and walks stiffly with cane Extremities:  no edema, no erythema  Neurologic:  alert & oriented X3 and strength normal in all extremities., new mild bilat hand tremors noted Skin:  color normal and no rashes.   Psych:  not anxious appearing and not depressed appearing.     Impression & Recommendations:  Problem # 1:  DIABETES MELLITUS, TYPE II (ICD-250.00)  Her updated medication list for this problem includes:    Lisinopril 20 Mg Tabs (Lisinopril) .Marland Kitchen... 2 by mouth once daily    Ecotrin 325 Mg Tbec (Aspirin)  Labs Reviewed: Creat: 1.1 (06/17/2009)    Reviewed HgBA1c results: 6.5 (06/17/2009)  6.5 (10/17/2007) stable overall by hx and exam, ok to continue meds/tx as is , Pt to cont DM diet, excercise, wt control efforts; to check labs today    Orders: TLB-BMP (Basic Metabolic Panel-BMET) (99991111) TLB-A1C / Hgb A1C (Glycohemoglobin) (83036-A1C) TLB-Lipid Panel (80061-LIPID) TLB-Microalbumin/Creat Ratio, Urine (82043-MALB)  Problem # 2:  HYPERTENSION (ICD-401.9)  Her updated medication list for this problem includes:    Furosemide 40 Mg Tabs (Furosemide) .Marland Kitchen... 1 by mouth once daily    Lisinopril 20 Mg Tabs (Lisinopril) .Marland Kitchen... 2 by mouth once daily    Amlodipine Besylate 5 Mg Tabs (Amlodipine besylate) .Marland Kitchen... 1 by mouth once daily  BP today: 142/70 Prior BP: 134/68 (06/17/2009)  Labs Reviewed: K+: 4.4 (06/17/2009) Creat: : 1.1 (06/17/2009)   Chol: 207 (06/17/2009)   HDL: 35.00 (06/17/2009)   LDL: DEL (10/17/2007)   TG: 164.0 (06/17/2009) diltiazem too expensive for her med plan - will change to amlodipine 5 mg once daily ,  to f/u  BP at home and next visit  Orders: TLB-Udip ONLY (81003-UDIP)  Problem # 3:  HYPERLIPIDEMIA (ICD-272.4)  with hx of stroke - Pt to continue diet efforts, good med tolerance; to check labs - goal LDL less than 70 ; consider statn  Labs Reviewed: SGOT: 24 (06/17/2009)   SGPT: 19 (06/17/2009)   HDL:35.00 (06/17/2009), 31.3 (10/17/2007)  LDL:DEL (10/17/2007)  Chol:207 (06/17/2009), 221 (10/17/2007)  Trig:164.0 (06/17/2009), 230 (10/17/2007)  Problem # 4:  TREMOR (ICD-781.0) bilat hands - prob essential,  ok to follow for now  Complete Medication List: 1)  Allopurinol 100 Mg Tabs (Allopurinol) .... 2 tabs by mouth every other day,1 by mouth qod 2)  Furosemide 40 Mg Tabs (Furosemide) .Marland Kitchen.. 1 by mouth once daily 3)  Lisinopril 20 Mg Tabs (Lisinopril) .... 2 by mouth once daily 4)  Ecotrin 325 Mg Tbec (Aspirin) 5)  Amlodipine Besylate 5 Mg Tabs (Amlodipine besylate) .Marland Kitchen.. 1 by mouth once daily  Other Orders: TLB-Uric Acid, Blood (84550-URIC) TLB-B12 + Folate Pnl [id number] TLB-Hepatic/Liver Function Pnl (80076-HEPATIC) TLB-CBC Platelet - w/Differential (85025-CBCD) TLB-TSH  (Thyroid Stimulating Hormone) (84443-TSH)  Patient Instructions: 1)  stop the diltiazem 2)  start the amlodipine 5 mg per day instead 3)  Continue all previous medications as before this visit  4)  Please go to the Lab in the basement for your blood and/or urine tests today 5)  Please call the number on the [facility] for results of your testing  6)  Please call for neurology referral if the tremor or walking gets worse 7)  Please schedule a follow-up appointment in 6 months. Prescriptions: ALLOPURINOL 100 MG  TABS (ALLOPURINOL) 2 tabs by mouth every other day,1 by mouth qod  #135 x 3   Entered and Authorized by:   Biagio Borg MD   Signed by:   Biagio Borg MD on 08/17/2010   Method used:   Electronically to        [facility] (retail)       [address] [place], [place]  [address]       Ph: [phone]       Fax: [phone]   [id number] FUROSEMIDE 40 MG  TABS (FUROSEMIDE) 1 by mouth once daily  #90 x 3   Entered and Authorized by:   Biagio Borg MD   Signed by:   Biagio Borg MD on 08/17/2010   Method used:   Electronically to        [facility] (retail)       [address] [place], [place]  [address]       Ph: [phone]       Fax: [phone]   [id number] LISINOPRIL 20 MG  TABS (LISINOPRIL) 2 by mouth once daily  #180 x 3   Entered and Authorized by:   Biagio Borg MD   Signed by:   Biagio Borg MD on 08/17/2010   Method used:   Electronically to        [facility] (retail)       [address] [place], [place]  [address]       Ph: [phone]  Fax: [phone]   [id number] AMLODIPINE BESYLATE 5 MG TABS (AMLODIPINE BESYLATE) 1 by mouth once daily  #90 x 3   Entered and Authorized by:   Biagio Borg MD   Signed by:   Biagio Borg MD on 08/17/2010   Method used:   Electronically to        [facility] (retail)        [address] [place], [place]  [address]       Ph: [phone]       Fax: [phone]   RxID:   [id number]    Orders Added: 1)  TLB-BMP (Basic Metabolic Panel-BMET) [id number] 2)  TLB-A1C / Hgb A1C (Glycohemoglobin) [83036-A1C] 3)  TLB-Lipid Panel [80061-LIPID] 4)  TLB-Microalbumin/Creat Ratio, Urine [82043-MALB] 5)  TLB-Uric Acid, Blood [84550-URIC] 6)  TLB-B12 + Folate Pnl [82746_82607-B12/FOL] 7)  TLB-Hepatic/Liver Function Pnl [80076-HEPATIC] 8)  TLB-CBC Platelet - w/Differential [85025-CBCD] 9)  TLB-TSH (Thyroid Stimulating Hormone) [84443-TSH] 10)  TLB-Udip ONLY [81003-UDIP] 11)  Est. Patient Level IV [id number]

## 2010-08-29 LAB — COMPREHENSIVE METABOLIC PANEL
ALT: 17 U/L (ref 0–35)
AST: 17 U/L (ref 0–37)
Albumin: 3.4 g/dL — ABNORMAL LOW (ref 3.5–5.2)
Alkaline Phosphatase: 84 U/L (ref 39–117)
BUN: 26 mg/dL — ABNORMAL HIGH (ref 6–23)
CO2: 28 mEq/L (ref 19–32)
Calcium: 9.5 mg/dL (ref 8.4–10.5)
Chloride: 106 mEq/L (ref 96–112)
Creatinine, Ser: 1 mg/dL (ref 0.4–1.2)
GFR calc Af Amer: >60 mL/min (ref >60)
GFR calc non Af Amer: 54 mL/min — ABNORMAL LOW (ref >60)
Glucose, Bld: 111 mg/dL — ABNORMAL HIGH (ref 70–99)
Potassium: 4 mEq/L (ref 3.5–5.1)
Sodium: 143 mEq/L (ref 135–145)
Total Bilirubin: 0.5 mg/dL (ref 0.3–1.2)
Total Protein: 6.8 g/dL (ref 6.0–8.3)

## 2010-08-29 LAB — URINALYSIS, ROUTINE W REFLEX MICROSCOPIC
Bilirubin Urine: NEGATIVE
Glucose, UA: NEGATIVE mg/dL
Hgb urine dipstick: NEGATIVE
Ketones, ur: NEGATIVE mg/dL
Nitrite: NEGATIVE
Protein, ur: NEGATIVE mg/dL
Specific Gravity, Urine: 1.012 (ref 1.005–1.030)
Urobilinogen, UA: 0.2 mg/dL (ref 0.0–1.0)
pH: 5.5 (ref 5.0–8.0)

## 2010-08-29 LAB — GLUCOSE, CAPILLARY
Glucose-Capillary: 113 mg/dL — ABNORMAL HIGH (ref 70–99)
Glucose-Capillary: 138 mg/dL — ABNORMAL HIGH (ref 70–99)
Glucose-Capillary: 143 mg/dL — ABNORMAL HIGH (ref 70–99)

## 2010-08-29 LAB — CBC
HCT: 43.7 % (ref 36.0–46.0)
Hemoglobin: 14.3 g/dL (ref 12.0–15.0)
MCHC: 32.8 g/dL (ref 30.0–36.0)
MCV: 89.8 fL (ref 78.0–100.0)
Platelets: 190 10*3/uL (ref 150–400)
RBC: 4.87 MIL/uL (ref 3.87–5.11)
RDW: 16.6 % — ABNORMAL HIGH (ref 11.5–15.5)
WBC: 7.7 10*3/uL (ref 4.0–10.5)

## 2010-08-29 LAB — TYPE AND SCREEN
ABO/RH(D): O NEG
Antibody Screen: POSITIVE
DAT, IgG: NEGATIVE

## 2010-08-29 LAB — PROTIME-INR
INR: 1.02 (ref 0.00–1.49)
Prothrombin Time: 13.3 seconds (ref 11.6–15.2)

## 2010-08-29 LAB — URINE MICROSCOPIC-ADD ON

## 2010-08-29 LAB — APTT: aPTT: 31 seconds (ref 24–37)

## 2010-09-01 LAB — CBC
HCT: 32.5 % — ABNORMAL LOW (ref 36.0–46.0)
HCT: 35 % — ABNORMAL LOW (ref 36.0–46.0)
HCT: 37.1 % (ref 36.0–46.0)
Hemoglobin: 10.9 g/dL — ABNORMAL LOW (ref 12.0–15.0)
Hemoglobin: 11.7 g/dL — ABNORMAL LOW (ref 12.0–15.0)
Hemoglobin: 12.1 g/dL (ref 12.0–15.0)
MCHC: 32.7 g/dL (ref 30.0–36.0)
MCHC: 33.5 g/dL (ref 30.0–36.0)
MCHC: 33.6 g/dL (ref 30.0–36.0)
MCV: 89.2 fL (ref 78.0–100.0)
MCV: 89.8 fL (ref 78.0–100.0)
MCV: 90.3 fL (ref 78.0–100.0)
Platelets: 173 10*3/uL (ref 150–400)
Platelets: 180 10*3/uL (ref 150–400)
Platelets: 182 10*3/uL (ref 150–400)
RBC: 3.61 MIL/uL — ABNORMAL LOW (ref 3.87–5.11)
RBC: 3.93 MIL/uL (ref 3.87–5.11)
RBC: 4.11 MIL/uL (ref 3.87–5.11)
RDW: 16.3 % — ABNORMAL HIGH (ref 11.5–15.5)
RDW: 16.4 % — ABNORMAL HIGH (ref 11.5–15.5)
RDW: 16.6 % — ABNORMAL HIGH (ref 11.5–15.5)
WBC: 10.1 10*3/uL (ref 4.0–10.5)
WBC: 9.7 10*3/uL (ref 4.0–10.5)
WBC: 9.7 10*3/uL (ref 4.0–10.5)

## 2010-09-01 LAB — BASIC METABOLIC PANEL
BUN: 22 mg/dL (ref 6–23)
BUN: 23 mg/dL (ref 6–23)
BUN: 25 mg/dL — ABNORMAL HIGH (ref 6–23)
BUN: 28 mg/dL — ABNORMAL HIGH (ref 6–23)
CO2: 26 mEq/L (ref 19–32)
CO2: 26 mEq/L (ref 19–32)
CO2: 29 mEq/L (ref 19–32)
CO2: 29 mEq/L (ref 19–32)
Calcium: 8.2 mg/dL — ABNORMAL LOW (ref 8.4–10.5)
Calcium: 8.3 mg/dL — ABNORMAL LOW (ref 8.4–10.5)
Calcium: 8.4 mg/dL (ref 8.4–10.5)
Calcium: 8.7 mg/dL (ref 8.4–10.5)
Chloride: 100 mEq/L (ref 96–112)
Chloride: 101 mEq/L (ref 96–112)
Chloride: 103 mEq/L (ref 96–112)
Chloride: 99 mEq/L (ref 96–112)
Creatinine, Ser: 1.15 mg/dL (ref 0.4–1.2)
Creatinine, Ser: 1.2 mg/dL (ref 0.4–1.2)
Creatinine, Ser: 1.24 mg/dL — ABNORMAL HIGH (ref 0.4–1.2)
Creatinine, Ser: 1.26 mg/dL — ABNORMAL HIGH (ref 0.4–1.2)
GFR calc Af Amer: 50 mL/min — ABNORMAL LOW (ref >60)
GFR calc Af Amer: 51 mL/min — ABNORMAL LOW (ref >60)
GFR calc Af Amer: 53 mL/min — ABNORMAL LOW (ref >60)
GFR calc Af Amer: 55 mL/min — ABNORMAL LOW (ref >60)
GFR calc non Af Amer: 41 mL/min — ABNORMAL LOW (ref >60)
GFR calc non Af Amer: 42 mL/min — ABNORMAL LOW (ref >60)
GFR calc non Af Amer: 44 mL/min — ABNORMAL LOW (ref >60)
GFR calc non Af Amer: 46 mL/min — ABNORMAL LOW (ref >60)
Glucose, Bld: 112 mg/dL — ABNORMAL HIGH (ref 70–99)
Glucose, Bld: 138 mg/dL — ABNORMAL HIGH (ref 70–99)
Glucose, Bld: 147 mg/dL — ABNORMAL HIGH (ref 70–99)
Glucose, Bld: 167 mg/dL — ABNORMAL HIGH (ref 70–99)
Potassium: 4 mEq/L (ref 3.5–5.1)
Potassium: 4.2 mEq/L (ref 3.5–5.1)
Potassium: 4.3 mEq/L (ref 3.5–5.1)
Potassium: 5.2 mEq/L — ABNORMAL HIGH (ref 3.5–5.1)
Sodium: 133 mEq/L — ABNORMAL LOW (ref 135–145)
Sodium: 135 mEq/L (ref 135–145)
Sodium: 135 mEq/L (ref 135–145)
Sodium: 137 mEq/L (ref 135–145)

## 2010-09-01 LAB — PROTIME-INR
INR: 1.11 (ref 0.00–1.49)
INR: 1.26 (ref 0.00–1.49)
INR: 1.52 — ABNORMAL HIGH (ref 0.00–1.49)
INR: 1.6 — ABNORMAL HIGH (ref 0.00–1.49)
INR: 1.75 — ABNORMAL HIGH (ref 0.00–1.49)
Prothrombin Time: 14.2 seconds (ref 11.6–15.2)
Prothrombin Time: 15.7 seconds — ABNORMAL HIGH (ref 11.6–15.2)
Prothrombin Time: 18.2 seconds — ABNORMAL HIGH (ref 11.6–15.2)
Prothrombin Time: 18.9 seconds — ABNORMAL HIGH (ref 11.6–15.2)
Prothrombin Time: 20.3 seconds — ABNORMAL HIGH (ref 11.6–15.2)

## 2010-09-01 LAB — GLUCOSE, CAPILLARY
Glucose-Capillary: 106 mg/dL — ABNORMAL HIGH (ref 70–99)
Glucose-Capillary: 106 mg/dL — ABNORMAL HIGH (ref 70–99)
Glucose-Capillary: 109 mg/dL — ABNORMAL HIGH (ref 70–99)
Glucose-Capillary: 112 mg/dL — ABNORMAL HIGH (ref 70–99)
Glucose-Capillary: 112 mg/dL — ABNORMAL HIGH (ref 70–99)
Glucose-Capillary: 113 mg/dL — ABNORMAL HIGH (ref 70–99)
Glucose-Capillary: 115 mg/dL — ABNORMAL HIGH (ref 70–99)
Glucose-Capillary: 115 mg/dL — ABNORMAL HIGH (ref 70–99)
Glucose-Capillary: 122 mg/dL — ABNORMAL HIGH (ref 70–99)
Glucose-Capillary: 122 mg/dL — ABNORMAL HIGH (ref 70–99)
Glucose-Capillary: 131 mg/dL — ABNORMAL HIGH (ref 70–99)
Glucose-Capillary: 134 mg/dL — ABNORMAL HIGH (ref 70–99)
Glucose-Capillary: 137 mg/dL — ABNORMAL HIGH (ref 70–99)
Glucose-Capillary: 139 mg/dL — ABNORMAL HIGH (ref 70–99)
Glucose-Capillary: 142 mg/dL — ABNORMAL HIGH (ref 70–99)
Glucose-Capillary: 143 mg/dL — ABNORMAL HIGH (ref 70–99)
Glucose-Capillary: 159 mg/dL — ABNORMAL HIGH (ref 70–99)

## 2010-09-15 ENCOUNTER — Other Ambulatory Visit: Payer: PRIVATE HEALTH INSURANCE

## 2010-10-20 ENCOUNTER — Other Ambulatory Visit: Payer: Self-pay | Admitting: Internal Medicine

## 2010-10-20 ENCOUNTER — Other Ambulatory Visit: Payer: PRIVATE HEALTH INSURANCE

## 2010-10-20 DIAGNOSIS — G609 Hereditary and idiopathic neuropathy, unspecified: Secondary | ICD-10-CM

## 2010-10-20 DIAGNOSIS — E7800 Pure hypercholesterolemia, unspecified: Secondary | ICD-10-CM

## 2010-10-20 DIAGNOSIS — Z79899 Other long term (current) drug therapy: Secondary | ICD-10-CM

## 2010-10-26 NOTE — Assessment & Plan Note (Signed)
[facility] OFFICE NOTE   NAME:[name], [name]                       MRN:          [id number]  DATE:01/25/2008                            DOB:          1929-11-10    PRIMARY CARDIOLOGIST:  Marie Bond. Marie Knack, MD.   PRIMARY CARE [name]:  Marie Borg, MD.   ORTHOPEDIC SURGEON:  Marie Arabian, MD   PATIENT PROFILE:  A 77 year old Caucasian female without prior history  of coronary artery disease who presents for preoperative clearance prior  to left knee replacement.   PROBLEM LIST:  1. History of abnormal EKG.      a.     Pharmacologic Cardiolite on May 02, 2002, revealing EF       of 64% with a small inferobasal partially reversible defect felt       most likely to represent attenuation artifact.  Overall, low-risk       study.      b.     September 21, 2006, 2-D echocardiogram, EF of 60-65%, no       regional wall motion abnormalities.  2. Hypertension.  3. Hyperlipidemia with statin intolerance.  4. Morbid obesity.  5. Osteoarthritis status post hip replacement and pending left knee      replacement.  6. Status post biliary stenting.  7. Status post hysterectomy at age 33.  45. Status post remote tonsillectomy.  9. Gout.   HISTORY OF PRESENT ILLNESS:  A 77 year old Caucasian female with the  above problem list.  She was last seen by Dr. Burt [name] in clinic on September 26, 2006.  She presents today as she is pending left knee replacement  with Dr. Wynelle [name] tentatively scheduled for March 17, 2008.  Over the  past year, she has not had any chest pain, pressure, or tightness or  dyspnea.  Her activity level has gone down as a result of her  significant bilateral knee pain; however, she does ambulate with a  walker and has not had any significant limitations from a  cardiopulmonary standpoint.  She reports her blood pressure is well  controlled at home.  Unfortunately, she does not tolerate statin therapy  and therefore is not on statin.   ALLERGIES:  SULFA, CODEINE, and STATINs cause myalgias.   HOME MEDICATIONS:  1. Aspirin 325 mg daily.  2. Lisinopril 40 mg daily.  3. Furosemide 40 mg daily.  4. Diltiazem 240 mg daily.  5. Allopurinol 100 mg 2 tablets every other day, 1 tablet every other      day.   PHYSICAL EXAMINATION:  VITAL SIGNS:  Blood pressure 136/66, heart rate  68, respirations 16, weight is 250 pounds down 60 pounds since the last  visit.  GENERAL:  A pleasant white female in no acute distress.  Awake, alert,  and oriented x3.  HEENT:  Normal.  NEURO:  Grossly intact, nonfocal.  SKIN:  Warm and dry without lesions or masses.  NECK:  No bruits or JVD.  LUNGS:  Respirations are unlabored.  Clear to auscultation.  CARDIAC:  Regular S1, S2.  No  S3, S4, or murmurs.  ABDOMEN:  Round, soft, nontender, nondistended.  Bowel sounds present  x4.  EXTREMITIES:  Warm, dry, pink.  No clubbing, cyanosis, or edema.  Dorsalis pedis and posterior tibial pulses 2+ and equal bilaterally.   ACCESSORY CLINICAL FINDINGS:  ECG today shows sinus rhythm without acute  ST or T abnormalities.   ASSESSMENT AND PLAN:  1. Bilateral knee pain, pending left knee replacement.  From a cardiac      standpoint, the patient is doing well without any symptoms or      significant cardiopulmonary limitations.  She had a normal      echocardiogram a year ago revealing normal left ventricular      function without wall motion abnormalities.  At this point, we feel      her to be a low surgical risk from a cardiac standpoint and feel      that she can have surgery with Dr. Wynelle [name] without having      additional ischemic evaluation.  2. Hypertension, stable.  Continue home regimen.  3. Hyperlipidemia, unfortunate that the patient is intolerant to      multiple statins.  4. Obesity.  The patient has had limitations in her activities as      result of her knee pain, and hopefully, once she has surgery,  she      can increase all of her activities.  5. Disposition.  The patient will follow up with Dr. Burt [name] as needed.      [name], ANP  Electronically Signed      Marie Bond. Marie Knack, MD  Electronically Signed   CB/MedQ  DD: 01/25/2008  DT: 01/26/2008  Job #: [id number]

## 2010-10-26 NOTE — Op Note (Signed)
[name], [name]                ACCOUNT NO.:  [id number]   MEDICAL RECORD NO.:  [id number]          PATIENT TYPE:  INP   LOCATION:  17                         FACILITY:  [facility]   PHYSICIAN:  Gaynelle Arabian, M.D.    DATE OF BIRTH:  30-Oct-1929   DATE OF PROCEDURE:  03/17/2008  DATE OF DISCHARGE:                               OPERATIVE REPORT   PREOPERATIVE DIAGNOSIS:  Osteoarthritis, left knee.   POSTOPERATIVE DIAGNOSIS:  Osteoarthritis, left knee.   PROCEDURE:  Left total knee arthroplasty.   SURGEON:  Gaynelle Arabian, M.D.   ASSISTANT:  Arlee Muslim, PA-C.   ANESTHESIA:  General with postop Marcaine pain pump.   ESTIMATED BLOOD LOSS:  Minimal.   DRAINS:  None.   TOURNIQUET TIME:  29 minutes at 300 mmHg.   COMPLICATIONS:  None.   CONDITION:  Stable to recovery room.   Ms. Brincefield is a 77 year old female who has end-stage arthritis of both  knees, the left more symptomatic than the right.  She has failed  nonoperative management and presents for total knee arthroplasty.   PROCEDURE IN DETAIL:  After successful administration of general  anesthetic, a tourniquet is placed high on her left thigh and her left  lower extremity is prepped and draped in the usual sterile fashion.  The  extremity is wrapped in an Esmarch, knee flexed, tourniquet inflated to  300 mmHg.  A midline incision is made with a 10 blade through  subcutaneous tissue to the level of the extensor mechanism.  A fresh  blade is used make a medial parapatellar arthrotomy.  Soft tissue over  the proximal medial tibia is subperiosteally elevated to the joint line  with the knife and into the semimembranosus bursa with a Cobb elevator.  Soft tissue laterally is elevated, with attention being paid to avoid  the patellar tendon on tibial tubercle.  The patella is subluxed  laterally, knee flexed 90 degrees, and the ACL and PCL removed.  A drill  is used to create a starting hole in the distal femur and canal is  thoroughly irrigated.  The 5-degree left valgus alignment guide is  placed, referencing off the posterior condyles, rotation is marked and  the block pinned to remove 11 mm of the distal femur.  Eleven are taken  because of a preop flexion contracture.  Distal femoral resection is  made with an oscillating saw.  A sizing block is placed.  A size 3 is  appropriate.  Rotation is marked at the epicondylar axis.  A size 3  cutting block is placed and the anterior-posterior chamfer cuts made.   Tibia subluxed forward and menisci removed.  An extramedullary tibial  alignment guide is placed, referencing proximally at the medial aspect  of the tibial tubercle and distally along the second metatarsal axis and  tibial crest.  The block is pinned to remove 10 mm off the non deficient  lateral side.  Tibial resection is made with an oscillating saw.  A  sizing block is placed.  Size 3 is most appropriate.  The size 3 is  placed and then  the  proximal tibia is prepared with a modular drill and  keel punch.  Femoral preparation is completed with the intercondylar  cut.   A size 3 mobile bearing tibial trial, size 3 posterior stabilized  femoral trial and a 10-mm posterior stabilized rotating platform insert  trial are placed.  With the 10, full extension is achieved, with  excellent varus-valgus anterior-posterior balance throughout full range  of motion.  The patella was then everted and the patient measured to be  21 mm.  Freehand resection is taken to 12 mm, a 35 template is placed,  lug holes are drilled, trial patella is placed and it tracks normally.  Osteophytes removed off the posterior femur with the trial placed.  All  trials are removed and the cut bone surfaces prepared with pulsatile  lavage.  Cement is mixed and once ready for implantation, the size 3  mobile bearing tibial tray, size 3 posterior stabilized femur and 35  patella are cemented into place and the patella is held with the  clamp.  The trial 10-mm insert is placed, knee held in full extension and all  extruded cement removed.  With the cement sufficiently hardened, then  the permanent 10-mm posterior stabilized rotating platform insert is  placed into the tibial tray.  The wound is copiously irrigated with  saline solution.  FloSeal is then injected into the posterior capsule,  the medial and lateral gutters and suprapatellar area.  A moist sponge  is placed and the tourniquet released for total time of 29 minutes.  The  sponge is held for 2 minutes, then removed.  Minimal bleeding is  encountered, and that which is encountered is stopped with  electrocautery.  The wound is again copiously irrigated with saline  solution and the extensor mechanism closed with interrupted #1 PDS.  Flexion against gravity is 135 degrees.  The subcu is closed with  interrupted 2-0 Vicryl and subcuticular running 4-0 Monocryl.  A  catheter for the Marcaine pain pump is placed and the pump is initiated.  Steri-Strips and a bulky sterile dressing are applied and she is placed  into a knee immobilizer, awakened, and transferred to recovery in stable  condition.      Gaynelle Arabian, M.D.  Electronically Signed     FA/MEDQ  D:  03/17/2008  T:  03/18/2008  Job:  [id number]

## 2010-10-26 NOTE — H&P (Signed)
NAMESIAHNA, TODISCO                ACCOUNT NO.:  [id number]   MEDICAL RECORD NO.:  [id number]          PATIENT TYPE:  INP   LOCATION:                               [facility]ACILITY:  [facility]   PHYSICIAN:  Gaynelle Arabian, M.D.    DATE OF BIRTH:  [birth year]   DATE OF ADMISSION:  03/17/2008  DATE OF DISCHARGE:                              HISTORY & PHYSICAL   PRIMARY CARE PHYSICIAN:  Biagio Borg, M.D.   CARDIOLOGIST:  Juanda Bond. Burt Knack, M.D.   CHIEF COMPLAINT:  Bilateral knee pain.   HISTORY OF PRESENT ILLNESS:  Patient is a 77 year old female with  progressively worsening bilateral knee pain.  She has failed  conservative treatment.  Evaluation shows that she has significant  arthritis in bilateral knees.  She has a windswept type deformity with a  valgus deformity of her right knee and a varus deformity of her left  knee.  She has near bone-on-bone medial compartment on her left knee and  bone-on-bone lateral compartment of her right knee.  Patient has elected  to proceed with a total knee arthroplasty on the left as that is the  knee that is more symptomatic.   ALLERGIES:  CODEINE and SULFA.   CURRENT MEDICATIONS:  Allopurinol, Lasix, lisinopril, Cardizem, aspirin.   PAST MEDICAL HISTORY:  1. Includes hypertension.  2. History of rheumatic fever.  3. Hypercholesterolemia.  4. History of a CVA, affecting the left hand and foot, five years      previous.  5. Diet-controlled diabetes.  6. History of kidney stones.  7. History of recent urinary tract infection.  8. Upper and lower dentures.   REVIEW OF SYSTEMS:  Positive for some numbness sensation in her left  hand and her left foot, secondary to her stroke, five years previous,  unchanged.  She has no other limitations.  PULMONARY:  Unremarkable.  CARDIOVASCULAR:  No previous heart attacks, no shortness of breath or  chest pains.  GI:  She does have a hiatal hernia, no problems with  ulcers, hepatitis.  GU:  Positive for urinary  tract infection, six  months previous.  She has had kidney stones in the past, but since she  has been on the allopurinol, no recurrence.  ENDOCRINE:  Positive for an  elevated blood sugar, which she is currently just diet-controlled.  HEMATOLOGIC:  She has had a blood transfusion in the past with a tubal  pregnancy and with the abdominal surgery in the past, she did have blood  clots.   PAST SURGICAL HISTORY:  Includes a hip replacement, tonsils, two  abdominal surgeries and a hysterectomy without any complications of  anesthesia.   FAMILY MEDICAL HISTORY:  Father is deceased from emphysema at 62 years  of age.  Mother is deceased from a stroke at 9.   SOCIAL HISTORY:  Patient is married.  She works as a Theme park manager.  She  has never smoked, no alcohol or drugs.  She has two grown children.  She  lives in a [place] home.   PHYSICAL EXAM:  VITALS:  Height 5 feet 6, weight is  240, blood pressure  is 142/64, pulse is 74 and regular, respirations 12 and unlabored.  Patient is afebrile.  GENERAL:  This is an elderly-appearing female, heavy-set, ambulates with  a rolling walker, appears to be in no extreme distress.  HEENT:  Head was normocephalic.  Pupils equal, round and reactive.  Gross hearing is intact.  NECK:  Supple, no palpable lymphadenopathy.  She had good range of  motion.  CHEST:  She had a slightly hyperkyphotic type chest wall.  Her lung  sounds were clear and equal throughout.  HEART:  Regular rate and rhythm, no murmurs, rubs or gallops.  ABDOMEN:  Soft, nontender, bowel sounds present.  UPPER EXTREMITIES:  She did have a little bit of decreased range of  motion of her both shoulders, but she had normal motion of her elbows  and wrists.  Motor strength was 5/5.  LOWER EXTREMITIES:  Both hips she was able to fully extend.  She could  flex them both up to about 110.  On the right, she had about 20 degrees  of internal and external rotation; left she had about 15  degrees of  internal and external rotation without any discomfort.  Both knees:  She  could fully extend them.  She could flex both knees back to about 110  degrees.  She did have a valgus deformity on her right, valgus deformity  on her left.  The calves were soft and nontender.  Ankles were symmetric  with good motion.  PERIPHERAL VASCULAR:  Carotid pulses 2+, and radial pulses were 2+,  dorsalis pedis pulses were 1+.  She had some slight edema in both  extremities.  She had significant varicosities throughout.  NEUROLOGIC:  Patient was conscious, alert and appropriate.  She had no  gross neurologic defects present.  BREAST, RECTAL AND GU EXAMS:  Deferred at this time.   IMPRESSION:  1. End-stage osteoarthritis, bilateral knees, left more symptomatic      than right.  2. Hypertension.  3. History of cerebrovascular accident, affecting left side.  4. History of hiatal hernia.  5. History of hypercholesterolemia.  6. History of rheumatic fever.  7. History of diet-controlled diabetes.  8. History of urinary tract infections.  9. History of upper and lower dentures.  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   PLAN:  Patient will undergo all routine labs and chest, prior to having  a left total knee arthroplasty by Dr. Maureen Ralphs at [facility] on  October 5.      Evert Kohl, P.A.      Gaynelle Arabian, M.D.  Electronically Signed    RWK/MEDQ  D:  03/07/2008  T:  03/07/2008  Job:  [id number]   cc:   Biagio Borg, MD  [address] [address]   Michael D. Burt Knack, [place]  [place],  [id number]

## 2010-10-26 NOTE — Discharge Summary (Signed)
[name], [name]                ACCOUNT NO.:  [id number]   MEDICAL RECORD NO.:  [id number]          PATIENT TYPE:  INP   LOCATION:  1611                         FACILITY:  [facility]   PHYSICIAN:  Gaynelle Arabian, M.D.    DATE OF BIRTH:  [birth year]   DATE OF ADMISSION:  03/17/2008  DATE OF DISCHARGE:  03/21/2008                               DISCHARGE SUMMARY   ADMISSION DIAGNOSES:  1. End-stage osteoarthritis, bilateral knees.  Left more symptomatic      than right.  2. Hypertension.  3. Cerebrovascular accident with left-sided being effected.  4. History of hiatal hernia.  5. Hypercholesterolemia.  6. History of rheumatoid fever.  7. Diet-controlled diabetes.  8. History of urinary tract infections.  9. Varicosities, lower extremities.   DISCHARGE DIAGNOSES:  1. Osteoarthritis, left knee, status post left total knee replacement      arthroplasty.  2. Osteoarthritis, right knee.  3. Hypertension.  4. Cerebrovascular accident with left-sided being effected.  5. History of hiatal hernia.  6. Hypercholesterolemia.  7. History of rheumatoid fever.  8. Diet-controlled diabetes.  9. History of urinary tract infections.  10.Varicosities, lower extremities.   PROCEDURE:  On March 17, 2008, left total knee.   SURGEON:  Gaynelle Arabian, M.D.   ASSISTANT:  Alexzandrew L. Perkins, P.A.C.   ANESTHESIA:  General.   CONSULTS:  None.   BRIEF HISTORY:  Ms. Guitreau is a 77 year old female with end-stage  arthritis in both knees, left more symptomatic than right.  Failed  nonoperative management and now presents for total knee arthroplasty.   LABORATORY DATA:  CBC:  Preop hemoglobin of 14.5, hematocrit 44.4, white  cell count 6.9, red cell count 4.86, platelets 167.  Chem panel on  admission:  Hemoglobin elevated at 29.  Remaining chem panel within  normal limits.  PT/INR 12.7, 0.9 with a PTT of 23.  Preop UA negative.  Serial CBCs are followed.  Hemoglobin down to 11.8.  Last noted H&H  11.9  and 35.8.  Serial pro times followed.  Last noted PT/INR 19 and 1.5.  Serial BMETs are followed.  BUN had to go up from 25 to 31.  Creatinine,  a little bump up from 1.1 to 1.26.   CHEST X-RAY:  On March 13, 2008:  Stable exam.  No acute  cardiopulmonary process.   EKG on January 25, 2008 normal sinus rhythm, confirmed.   HOSPITAL COURSE:  Patient was admitted to [facility],  tolerated the procedure well, and was later transferred to the recovery  room and then to the orthopedic floor.  Started on PCA and p.o.  analgesics for pain control following surgery.  Had a fair amount of  pain on the evening of surgery, doing a little bit better on the morning  of day #1.  Not using the PCA much.  Encouraged pills.  Started to get  up out of bed.  Home meds are resumed except for blood pressure meds  which are placed on parameters.  Start getting up short distances on day  #1 and #2.  Walked about 12 feet.  By day #2, she is doing a little bit  better with the pain control.  Hemoglobin is stable.  Blood pressure  looked good.  Had decent output.  Incision looked good.  We changed the  dressing.  Discontinued the Foley, PCA and the fluids.  Continued to  progress well with physical therapy, walking about 70 feet.  Unfortunately, she was very sedated on the morning of day #3.  Felt like  the medications had caught up with her.  We discontinued all of the  narcotics and put her on some Ultracet.  Need to monitor one more day.  Hemoglobin was stable.  Incision looked good.  By the morning of day #4,  though, she was doing much better, much more alert, back to her  baseline, tolerating her meds and was discharged home.   DISCHARGE PLAN:  1. Patient was discharged home on March 21, 2008.  2. Discharge diagnoses:  Please see above.  3. Discharge meds:  Ultracet, Robaxin, and Coumadin.  4. Diet:  Heart-healthy diet.  5. Activity:  Weightbearing as tolerated, left lower extremity.   Home      health PT, home health nursing, total knee protocol.  6. Followup:  In two weeks.   DISPOSITION:  Home.   CONDITION ON DISCHARGE:  Improved.      Alexzandrew L. Perkins, P.A.C.      Gaynelle Arabian, M.D.  Electronically Signed    ALP/MEDQ  D:  03/21/2008  T:  03/21/2008  Job:  [id number]   cc:   Biagio Borg, MD  [address] [address]   Michael D. Burt Knack, [place]  [place],  [phone]

## 2010-10-29 NOTE — Assessment & Plan Note (Signed)
[facility] OFFICE NOTE   NAME:Donaldson, Crystie A                       MRN:          [id number]  DATE:08/31/2006                            DOB:          1929/08/27    REFERRING PHYSICIAN:  Marland Kitchen T. Hoxworth, M.D.   OFFICE CONSULTATION NOTE:   HISTORY:  This is a 77 year old white female with a history of  hypertension, hyperlipidemia, morbid obesity, prior stroke, gout and  anxiety.  She is referred through the courtesy of Dr. Excell Seltzer for  evaluation of abdominal pain and abnormal biliary imaging.  The  patient's GI history is remarkable for chronic constipation, colon  polyps, diverticulosis, and gastroesophageal reflux disease, for which  he has undergone prior esophageal dilation.  The patient also suffered a  colonic perforation after a colonoscopy in 1995.  This required surgical  intervention.  Her current history is that of moderately severe right  upper quadrant and right subscapular pain occurring intermittently,  though fairly steady over about a 2-week time period.  For this, she was  referred to Dr. Excell Seltzer.  Initial evaluation included an abdominal  ultrasound, as well as liver function tests.  The ultrasound did not  show gallstones.  The liver function tests were normal.  A CT scan of  the abdomen and pelvis was obtained and revealed dilation of the  extrahepatic bile duct.  There was no pancreatic mass or pancreatic duct  dilation.  For this, an MRCP was recommended and obtained.  The MRCP  again demonstrated fusiform dilation of the extrahepatic bile ducts.  There were said to be borderline intrahepatic duct dilation, though a  choledochal cyst was considered.  The patient was said to have  progressive biliary dilation from a CT scan obtained in 2003.  Again,  there was no obvious mass, though consideration of a possible ampullary  lesion or ampullary stenosis was given.  ERCP was  recommended.  The  patient is now referred for that reason.  The patient has had no  problems with recurrent abdominal pain since her evaluation with Dr.  Excell Seltzer 3 weeks ago.  She denies history of similar pain prior to the  more recent episode.  She has no heartburn or nausea.  No recurrent  dysphagia since her last esophageal dilation with Dr. Velora Heckler in 2005.  The last colonoscopy was in 2004 and revealed dimunitive polyps only.  She denies fevers, jaundice, darkening of her urine or lightening of her  stools.  Her weight has been stable.  Her appetite is good.   PAST MEDICAL HISTORY:  1. Hypertension.  2. Hyperlipidemia.  3. Acute CVA to the right thalamic region in 2003.  4. History of irritable bowel (constipation predominant).  5. History of gout.  6. Diverticulosis and colon polyps.  7. Esophageal stricture requiring dilatation.  8. History of anxiety.   PAST SURGICAL HISTORY:  1. Status post benign pelvic bone tumor removal.  2. Status post right total hip arthroplasty for degenerative joint      disease.  3. Status post total abdominal hysterectomy with ovaries intact.  4. Status  post exploratory laparotomy with repair of colonic      perforation in 1995.   ALLERGIES:  1. SULFA.  2. CODEINE.  3. CRESTOR.  4. ZOCOR.   CURRENT MEDICATIONS:  1. Aspirin 325 mg daily.  2. Lisinopril 40 mg daily.  3. Furosemide 40 mg daily.  4. Diltiazem 240 mg daily.  5. Allopurinol 200 mg daily alternating with 100 mg daily.   SOCIAL HISTORY:  The patient is married with children.  She is a semi-  retired Theme park manager.  She does not smoke or use alcohol.   FAMILY HISTORY:  The patient has 2 sisters with breast cancer.  There is  also a family history of stroke, hypertension and diabetes.  No known  history of pancreatic or biliary cancer.   REVIEW OF SYSTEMS:  Noncontributory.   PHYSICAL EXAMINATION:  GENERAL:  Pleasant, slow-moving elderly female  who is obese.  She is alert  and oriented, in no acute distress.  VITAL SIGNS:  Blood pressure is 144/64, heart rate is 72 and regular,  weight is 257 pounds.  She is 5 feet, 6 inches in height.  HEENT:  Sclerae are anicteric.  Conjunctivae are pink.  Oral mucosa  intake.  No adenopathy.  LUNGS:  Clear.  HEART:  Regular.  ABDOMEN:  Obese and soft without tenderness, mass or hernia.  Prior  surgical incisions are well healed.  EXTREMITIES:  Without edema.   IMPRESSION:  This is a 77 year old female with multiple medical problems  who recently had problems with right upper quadrant and right scapular  pain consistent with biliary colic.  Multiple imaging studies reveal  dilation of the extrahepatic bile duct of uncertain clinical  significance.  Some concern over a possible ampullary lesion resulting  in obstruction, as described above.  Normal liver tests, however.  I  have discussed with the patient today in great detail that her recent  problems with pain could be completely unrelated to the imaging findings  of her bile duct.  I also explained that it could, indeed, be related if  she were to have ampullary stenosis or transient stone disease (however,  we might expect abnormal liver tests).  We also discussed that her  imaging findings may be entirely benign.  However, the concern over a  possible ampullary lesion,particularly given apparrent interval change,  was also reviewed.  I also discussed with her in great detail the next  step in terms of evaluation and possible treatment -- namely endoscopic  retrograde cholangiopancreatography with possible biliary sphincterotomy  and/or stent placement.  I discussed with her in great deal the nature  of the procedure, as well as the risks (including but not limited to  pancreatitis, perforation requiring surgery, infection, bleeding and  cardiopulmonary complications), benefits and alternative strategies.  We also discussed expectant management with follow up  imaging and liver  tests some months down the road and endoscopic ultrasound.  At this  point, it is her preference to proceed with ERCP.  Accordingly, I have  provided her with additional literature, as well as a procedure-specific  consent form.  I also told her to have an overnight bag packed at the  time of the procedure, as she may need to be admitted thereafter,  depending upon the outcome.  In the interim, she will continue her  general medical care with Dr. Jenny Reichmann.  I did tell her to stay on her  aspirin, given her prior history of stroke.  She understood.  Docia Chuck. Henrene Pastor, MD  Electronically Signed    JNP/MedQ  DD: 08/31/2006  DT: 08/31/2006  Job #: [id number]   cc:   Darene Lamer. Hoxworth, M.D.  Clarene Reamer, MD  Biagio Borg, MD

## 2010-10-29 NOTE — Op Note (Signed)
[place]. [facility]  Patient:    [name]                        MRN: [id number] Proc. Date: 07/06/99 Adm. Date:  [id number] Disc. Date: [id number] Attending:  Joya San CC:         Alysia Penna. Doug Sou, M.D.                           Operative Report  CCS#: [id number]  PREOPERATIVE INDICATIONS: [name] is [name] 77 year old lady that I have been seeing and evaluating for lower abdominal discomfort.  She had [name] CT-scan, which showed [name] pelvic mass and this was percutaneously aspirated and biopsied and was found to  contain hematopoietic tissue.  It was uncertain, but on the CT-scan, it was felt to probably represent [name] teratoma possibly.  Because this was ill-defined and new, he is brought to the OR at this time for removal.  SURGEON:  Rodman Key B. Hassell Done, M.D.  ASSISTANT:  Odis Hollingshead, M.D.  ANESTHESIA:  General endotracheal.  PREOPERATIVE DIAGNOSIS:  Presacral mass.  POSTOPERATIVE DIAGNOSIS:  Presacral mass. Frozen sections indicated that it might be infarcted lipoma, cannot rule out liposarcoma, possible hematopoietic tissue.  DESCRIPTION OF PROCEDURE:  [name] was taken to OR 15 on the afternoon of January 23 and was given general anesthesia.  The abdomen was prepped with Betadine and  draped sterilely.  I made [name] midline incision from just above the pubis up to just below the umbilicus.  I encountered [name] kind of broad eventration or [name] fascial hernia and opened it up and took the omentum, which was kind of stuck to it.  I took all that down and entered the abdomen and freed up the bowel.  I worked principally in the pelvis.  I did palpate in the upper abdomen, but did not feel any palpable masses.  I packed the bowel off and used the Balfour retractor with the Balfour  extender and bladder blade.  I went into the pelvis and mobilized the sigmoid from the bladder, carried this down to where I could mobilize the sigmoid.   Beneath he sigmoid, I felt [name] soft mass.  It was way down in the pelvis.  I was central to he pelvis, right on top of the sacrum.  I incised the peritoneum.  I did not transect any tubular structures and felt that the ureters were lateral to this.  This was also deep to that as I was in the pelvis, well below the ovaries, which I examined and were somewhat effete.  After incising the peritoneal reflection, I used gentle blunt dissection and could feel the mass and there was [name] pseudocapsule, which I  entered and this actually shelled out rather easily anteriorly.  As I mobilized it and as it shelled out posteriorly, came across an area, where the bone actually  just felt like raw little spicules.  The mass was delivered and look like splenic tissue.  This was sent to Dr. Deborra Medina for pathologic examination.  Suctioned in the base there at the edge of the bone, there was some venous bleeding, which I  could easily control with packing with the laparotomy pack.  I sent this specimen down and as I waited, I went back down with the argon beam coagulator and cauterized the area, where this big presacral mass had been removed.  I  went back in again and there was still some, you know [name] lot fair amount of oozing from the bone edge and I used [name] piece of bone wax and put into this area of the bone that was exposed.  This seemed to control bleeding.  I packed the area with Surgicel and we kept [name] pack on this for [name] while, probably 5-8 minutes.  We reexamined it and  there was no active bleeding seen.  Elected not to put [name] drain in because active bleeding would probably clot off the drain.  I felt that I would bring the patient for careful monitoring in intensive care with frequent vital signs and observation.  The patient was then closed after all the sponge and needle counts were correct. I closed the posterior sheath and peritoneum, which probably was [name] lot of the reinforced  hernia sac with [name] running #1 PDS.  I then closed the anterior sheath  with [name] running #1 Prolene from above and below and tied in the middle.  The wound was irrigated.  The skin was closed with [name] stapler.  The patient tolerated the procedure well and was taken to the recovery room in satisfactory condition. The patients family was apprised of the nature and the uncertainty of this pelvic tumor.  They are also aware that she may need to have be reoperated on emergently. I also told them that she might have to have blood.  PLAN: To ICU postoperative for observation. DD:  07/06/99 TD:  07/07/99 Job: [id number]

## 2010-10-29 NOTE — Discharge Summary (Signed)
[place]. [facility]  Patient:    [name]                        MRN: [id number] Adm. Date:  [id number] Disc. Date: [id number] Attending:  Joya San                           Discharge Summary  DISCHARGE DIAGNOSIS:  Myelolipoma of the presacral region.  HISTORY OF PRESENT ILLNESS:  The patient is [name] 77 year old lady who presented with [name] presacral mass.  On biopsy was found to contain hematopoietic tissue.  HOSPITAL COURSE:  She was admitted after [name] bowel prep, and was taken on July 06, 1999, for [name] laparotomy and the removal of [name] pelvis mass from her sacrum, as well as the repair of [name] ventral eventration.  The pathology fortunately showed this to e [name] benign myelolipoma.  Her diet was advanced, and she was ready for discharge on June 20, 1999.  She was doing well and not having any complaints.  FOLLOWUP:  Arrangements were made for followup in the office in two weeks. DD:  09/05/99 TD:  09/05/99 Job: [id number]

## 2010-10-29 NOTE — Assessment & Plan Note (Signed)
[place] OFFICE NOTE   NAME:Handyside, Cathlin A                       MRN:          [id number]  DATE:10/06/2006                            DOB:          April 19, 1930    HISTORY:  Ms. [name] presents today for followup. She is a 77 year old  who was evaluated in the office August 31, 2006 for abdominal pain and  abnormal biliary imaging. See that dictation for details. She  subsequently underwent ERCP on September 13, 2006. She was found to have a  benign ampullary stenosis and underwent biliary sphincterotomy. She  recovered uneventfully and was discharged home that day. She presents  today for followup as requested. Since her procedure, the patient states  that she is feeling well. Her right-sided pain has resolved. She also  states that her energy levels are better. She is pleased and has no  complaints.   CURRENT MEDICATIONS:  Aspirin, lisinopril, furosemide, diltiazem, and  allopurinol.   PHYSICAL EXAMINATION:  GENERAL:  Well-appearing female in no acute  distress.  VITAL SIGNS:  Her blood pressure is 130/72, heart rate 80 and regular,  weight 256 pounds.  ABDOMEN:  Soft, nontender, nondistended with good bowel sounds.   IMPRESSION:  1. 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 The      patient is clinically improved.  2. Multiple significant medical problems as previously outlined.   RECOMMENDATIONS:  1. The patient will return to the care of her primary Florean Hoobler, Dr.      [name].  2. Gastrointestinal followup on an as needed basis.     Docia Chuck. Henrene Pastor, MD  Electronically Signed    JNP/MedQ  DD: 10/06/2006  DT: 10/06/2006  Job #: [id number]   cc:   Darene Lamer. Hoxworth, M.D.  Biagio Borg, MD  Clarene Reamer, MD

## 2010-10-29 NOTE — Op Note (Signed)
NAMEQuenna, [name]                ACCOUNT NO.:  [id number]   MEDICAL RECORD NO.:  [id number]          PATIENT TYPE:  AMB   LOCATION:  ENDO                         FACILITY:  [facility]   PHYSICIAN:  Docia Chuck. Henrene Pastor, MD      DATE OF BIRTH:  Aug 27, 1929   DATE OF PROCEDURE:  09/13/2006  DATE OF DISCHARGE:                               OPERATIVE REPORT   PROCEDURE:  Endoscopic retrograde cholangiopancreatography with biliary  sphincterotomy.   INDICATIONS FOR PROCEDURE:  Biliary dilation and right upper quadrant  pain.   HISTORY:  This is a 77 year old white female with multiple medical  problems who was recently evaluated in the office with right upper  quadrant and right scapular pain consistent with biliary colic.  Multiple imaging studies were performed and revealed dilation of the  extrahepatic bile duct of uncertain significance.  There was no evidence  of stones and her liver tests were normal.  She is now for ERCP with  possible sphincterotomy and/or stent placement.  The nature of the  procedure as well as the risks, benefits and alternatives were discussed  in great detail.  She understood and agreed to proceed.  Since her last  office visit, she has continued to have some intermittent right sided  pain.  No other problems.  Her gallbladder is intact.   PHYSICAL EXAMINATION:  GENERAL APPEARANCE:  Elderly female in no acute  distress.  She is alert and oriented.  VITAL SIGNS:  Stable.  CHEST:  Lungs are clear.  CARDIOVASCULAR:  Heart is regular.  ABDOMEN:  Obese and soft without tenderness, mass or hernia.  Prior  surgical incisions are well-healed.   DESCRIPTION OF PROCEDURE:  After informed consent was obtained, the  patient was sedated over the course of the procedure with 100 mcg of  fentanyl and 10 mg of Versed IV.  Glucagon 0.5 mg IV was given as a  duodenal relaxant.  The Pentax side viewing endoscope was then passed  blindly into the esophagus.  The distal stomach,  duodenal bulb and post  bulbar duodenum were normal.  The major ampulla was normal with no  evidence of tumor.  The minor ampulla was not sought.   X-RAY FINDINGS:  1. Scout radiograph of the abdomen with the endoscope in position was      unremarkable.  2. Intentional cannulation of the pancreatic duct yielded a normal      pancreatogram.  3. Access to the common bile duct was achieved via a hydrophilic guide      wire.  Complete filling of the biliary tree with contrast revealed      a diffusely dilated bile duct measuring 16 to 20 mm in maximal      diameter.  As well, mild intrahepatic ductal dilation was present.      There were no filling defects.  The cystic duct filled.  However,      there was abrupt pinpoint tapering at the level of the ampulla.      This was smooth.   THERAPY:  A large biliary sphincterotomy was carried out over  the  hydrophilic guide wire via the ERBE system.  Upon completing the  sphincterotomy, there was no evidence of intra ampullary tumor.  There  was abrupt and vigorous drainage of contrast.  Subsequently, the cutting  cannula was exchanged for a 15/12 mm sequential balloon.  The 15 mm  balloon was swept to the bottom of the bile duct and would not  spontaneously pass through the sphincterotomy.  However, a 12 mm balloon  did pass through the sphincterotomy without difficulty.  Post procedure  drainage was again found to be excellent.   IMPRESSION:  Ampullary stenosis status post endoscopic retrograde  cholangiopancreatography with biliary sphincterotomy.   RECOMMENDATIONS:  Observe the patient post procedure in the standard  manner.  Decide later this morning or early this afternoon regarding  possible discharge to home versus overnight stay.  In any event, plan  office follow-up in three to four weeks.      Docia Chuck. Henrene Pastor, MD  Electronically Signed     JNP/MEDQ  D:  09/13/2006  T:  09/13/2006  Job:  [id number]   cc:   Darene Lamer. Hoxworth,  M.D.  [address], [address]  [place]  [place] [address]   Biagio Borg, MD  [address]  [place]   [id number]   Clarene Reamer, MD  [address]  [place] [address]

## 2010-10-29 NOTE — Op Note (Signed)
NAME:  [name], [name]                          ACCOUNT NO.:  [id number]   MEDICAL RECORD NO.:  [id number]                   PATIENT TYPE:  AMB   LOCATION:  DAY                                  FACILITY[facility]  [facility]   PHYSICIAN:  Ronald [name]. Gioffre, M.D.             DATE OF BIRTH:  [birth year]   DATE OF PROCEDURE:  07/14/2003  DATE OF DISCHARGE:                                 OPERATIVE REPORT   PREOPERATIVE DIAGNOSES:  1. Torn lateral meniscus of the right knee.  2. Degenerative arthritis, right knee.   POSTOPERATIVE DIAGNOSES:  1. Torn lateral meniscus of the right knee.  2. Degenerative arthritis, right knee.   OPERATION:  1. Diagnostic arthroscopy, right knee.  2. Lateral meniscectomy, right knee.  3. Synovectomy of suprapatellar pouch, right knee.   SURGEON:  Kipp Brood. Gladstone Lighter, M.D.   ASSISTANT:  Nurse.   DESCRIPTION OF PROCEDURE:  Under general anesthesia, routine orthopedic prep  and draping of the right lower extremity was carried out.  The patient had 1  g of IV Ancef.  At this time after this initial prep and then [name] sterile prep  of the right lower extremity was carried out, sterile drapings were applied.  Small punctate incision made, suprapatellar pouch, inflow cannula was  inserted, and the knee was distended with saline.  Another small punctate  incision made in the lateral joint space and the arthroscope was entered  through the lateral approach, and I did [name] complete diagnostic arthroscopy.  At this time the medial meniscus was intact, but she had obvious  degenerative arthritis throughout the entire knee.  Her lateral meniscus was  completely torn from the midportion anteriorly and back posteriorly as well.  We used the shaver suction device to cut out the torn piece.  I preserved  the normal remaining piece of the lateral meniscus.  I thoroughly irrigated  out the area.  The cruciates were intact.  I went up in the suprapatellar  pouch and did [name] synovectomy.  She had  chronic synovitis.  The patella per se  looked fine.  I thoroughly irrigated out the knee, removed all the fluid,  closed all three punctate incisions with 3-0 nylon suture.  I injected 20 mL  of 0.5% Marcaine with epinephrine in the knee joint.  Sterile Neosporin  bundle dressings were applied.  The patient left the operating room in  satisfactory condition.                                               Ronald [name]. Gladstone Lighter, M.D.    RAG/MEDQ  D:  07/14/2003  T:  07/14/2003  Job:  [id number]

## 2010-10-29 NOTE — Op Note (Signed)
[place]. [facility]  Patient:    [name], [name]                       MRN: [id number] Proc. Date: 11/01/00 Adm. Date:  [id number] Attending:  Joya San                           Operative Report  CCS [id number].  PREOPERATIVE DIAGNOSIS:  Recurrent myelolipoma of the presacral region.  PROCEDURES:  Laparotomy, enterolysis, removal of recurrent myelolipoma from the presacral space near the rectum, and repair of ventral hernia.  SURGEON:  Isabel Caprice. Hassell Done, M.D.  ASSISTANT:  Abbott Pao. March Rummage, M.D.  OPERATIVE INDICATIONS:  The patient is a 77 year old lady who was admitted on July 06, 1999, for removal of a presacral tumor.  This was resected and had the appearance of splenic tissue.  It was found to be a myelolipoma.  This was resected, and she did well.  She was discharged on July 10, 1999, and did fine until this spring, when she was having more problems with constipation. She was also having some nonspecific lower abdominal pain and was seen in the [facility], where a CT scan was done, which showed a mass in her sacrum again consistent with myelolipoma.  We discussed this in the office on several occasions and management strategies, including observation.  However, she had been symptomatic and wanted to go ahead and have this removed.  Since it was recurrent, I told her that before I did everything I could to prevent this recurrence, so we would try to rid her of this recurrence but could not guarantee that it would not recur.  HISTORY OF PRESENT ILLNESS:  The patient was taken to the operating room on the afternoon of May 22, given general anesthesia.  Ancef was given preoperatively.  The abdomen was prepped with Betadine and draped sterilely. I excised her old skin incision, carried this down to the midline, where I opened the abdomen and because of this ventral _____ took a little more time and kind of carefully took down a  lot of adhesions of the anterior abdominal wall.  After freeing this up, I found the omentum was stuck down in the pelvis, and I freed that from the underlying bowel and identified the sigmoid colon, and I removed the terminal ileum, which was stuck down to the sigmoid colon.  After freeing this, I then began working my way down into the pelvis. I had the CT scans on the board and went down deep.  The rectosigmoid mesentery was stuck down to the superior part of the sacrum, and I entered into kind of a sterile cavity, which seemed to contain old bone wax and char from the previous control of the bleeding with the argon beam coagulator.  I then mobilized this further and encountered a mass.  It looked like a large plum with a thick, tan shell to it.  I was in the process of moving a finger around this and encountered some bleeding and mobilizing, I got the same myelolipoid material, and I evacuated this all and removed this sac but found that the sac was adherent, probably deriving its vascular supply, off the distal sacrum.  After removing this, I used a heavy argon beam coagulator and found some very brisk presacral bleeding.  This did not seem to be arterial but was more venous.  I used the  argon beam coagulator to the extent possible. A couple of occasions, got some pelvic contraction, probably from muscle stimulation, and did not persist when that occurred.  I put a figure-of-eight suture using 2-0 Prolene on a ledge of tissue overlying this bony spicule. These tended to slow the active bleeding to just some oozing.  I irrigated. We had blood ready to give if necessary.  I then held pressure and then packed the area with Surgicel and Gelfoam.  I had used the argon beam to coagulate the entire area round there, but this seemed to be pretty close to the rectal mesentery, and I had to be careful with that.  There was no evidence of any injury to the rectum, but no doubt this is why she was  having some problems with constipation and/or change in her bowel habits.  We then had this bleeding appeared to be controlled.  I approximated the overlying mesentery of the bowel just to kind of close off that potential space.  The wound was irrigated copiously with saline.  All sponge and needle counts were correct. The umbilical hernia had opened into the umbilicus to get that exposed.  That was closed with interrupted #1 Novofils, and the wound was then closed with running #1 Prolene interrupted and #1 Novofils spread in between.  The wound was then irrigated with saline and closed with the stapler.  The patient seemed to tolerate the procedure well and was taken to the recovery room in satisfactory condition. DD:  11/01/00 TD:  11/02/00 Job: [id number]

## 2010-10-29 NOTE — Letter (Signed)
September 26, 2006    Biagio Borg, MD  [address], [place] [address]   RE:  PINEY, LACHMAN  MRN:  [id number]  /  DOB:  1929/07/15   Dear Dr. Jenny Reichmann:   It was my pleasure to see Abrish Chughtai as an outpatient at the [facility]  Cardiologist office on September 26, 2006. As you know, she is a delightful  77 year old woman who was referred here for an abnormal EKG.   Ms. Rochez was experiencing pain in her back as well as her right upper  quadrant. She underwent radiographic studies of the abdomen and GI tract  and was found to have progressive biliary dilatation. She underwent  biliary stenting a few weeks ago and her symptoms have improved since  that time. She has really not had any further pain other than a few  twinges in her abdomen. She underwent an EKG on March 26 that  demonstrated an age indeterminate inferior infarct as well as a question  of an age indeterminate anterolateral infarct. She has been referred for  further evaluation of her abnormal EKG. She had an echocardiogram  performed prior to her visit here.   From a symptomatic standpoint, Ms. Mathe is doing relatively well. 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 She reports good blood pressure  control in general. She has not had any substernal chest pain. She  denies dyspnea, lightheadedness, syncope, palpitations, orthopnea or  PND.   CURRENT MEDICATIONS:  1. Aspirin 325 mg daily.  2. Lisinopril 40 mg daily.  3. Furosemide 40 mg daily.  4. Diltiazem 240 mg daily.  5. Allopurinol 100 mg alternating with 200 mg every other day.   ALLERGIES:  SULFA, CODEINE, CRESTOR, AND ZOCOR.   PAST MEDICAL HISTORY:  Pertinent for the following:  1. Essential hypertension.  2. Dyslipidemia.  3. Recent biliary stenting.  4. Hysterectomy at age 31.  93. Remote tonsillectomy.  6. Hip replacement in 1981.  7. Arthroscopic knee surgery.  8. Osteoarthritis.   SOCIAL HISTORY:  The patient is married. She has two children who  live  nearby. 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 She does  not smoke cigarettes or drink alcohol. She does not exercise.   FAMILY HISTORY:  Her mother died at age 38 of a stroke and father died  at age 62 of emphysema. There is no coronary artery disease in the  family. She has two siblings.   REVIEW OF SYSTEMS:  A complete 12-point review of systems was performed.  Pertinent positives include constipation, hiatal hernia, arthritis and  gout as well as ankle swelling. All other systems were reviewed and are  negative except as detailed above.   PHYSICAL EXAMINATION:  The patient is alert and oriented. She is in no  acute distress. Her weight is 255 pounds. Blood pressure is 150/78.  Heart rate is 80. Respiratory rate is 16.  HEENT: Is normal.  NECK: Normal carotid upstrokes without bruits. Jugular venous pressure  is normal. There is no thyromegaly or thyroid nodules.  LUNGS:  Clear to auscultation bilaterally.  CARDIOVASCULAR: Apex is not palpable. There is no right ventricular  heave or lift. The heart is regular rate and rhythm without murmurs or  gallops.  ABDOMEN: Soft, obese,  and nontender. No organomegaly. No abdominal  bruits.  BACK: There is no peri-spinal tenderness. No flank tenderness.  EXTREMITIES: There is trace bilateral pre-tibial edema. No clubbing,  cyanosis. Peripheral pulses are 2+  and equal throughout.  SKIN: Is warm and dry with varicosities in the lower legs noted.  NEUROLOGIC: Cranial nerves II-XII are intact. Strength is 5/5 and equal  in the arms and legs bilaterally.   EKG: Performed in the office today, demonstrates normal sinus rhythm.  Cannot rule out inferior infarct. Otherwise, within normal limits.   Echocardiogram performed on April 10th shows normal left ventricular  size and systolic function with left ventricular ejection fraction of  60% to 65%. There were no regional wall motion abnormalities. LV wall  thickness was  normal. There was mild focal basal septal hypertrophy. No  significant valvular disease was identified.   ASSESSMENT:  Ms. Blough is a 77 year old woman with hypertension and  dyslipidemia as well as marked obesity who has an abnormal EKG with a  possible inferior myocardial infarction pattern. Despite her EKG  abnormality, her echocardiogram demonstrates no regional wall motion  abnormalities. With normal LV function, I think it is clear that she has  not sustained a prior infarct. She is currently not having any symptoms  suggestive of angina. I would recommend just ongoing primary risk  reduction with treatment of her hypertension. She has tried multiple  statins and has been intolerant to lipid lowering therapy. She should  continue on daily aspirin. She tells me that she has upcoming surgery  for hammertoes and she is at low risk of any cardiac problems from that  surgery.   Dr. Jenny Reichmann, thanks for allowing me to see Ms. Holliman. Please feel free to  call at any time with questions regarding her care.    Sincerely,      Juanda Bond. Burt Knack, MD  Electronically Signed    MDC/MedQ  DD: 09/26/2006  DT: 09/26/2006  Job #: [id number]

## 2010-10-29 NOTE — Discharge Summary (Signed)
[facility]  Patient:    [name], [name]                       MRN: [id number] Adm. Date:  [id number] Disc. Date: [id number] Attending:  Joya San CC:         Rexene Edison, M.D.   Discharge Summary  ADMISSION DIAGNOSES:  Recurrent mass in pelvis, probable myelolipoma.  DISCHARGE DIAGNOSIS:  Recurrent myelolipoma.  HOSPITAL COURSE:  Dempsey Feider came in and had had an outpatient bowel prep, and underwent a laparotomy, enterolysis, and removal of a recurrent myelolipoma coming off her sacrum.  I also repaired a ventral hernia.  Because of this mass that seemed to arise from the sacrum and bled, I did watch her in the ICU and monitor hemoglobin.  It remained stable.  She was begun on diet and advanced.  She was ready for discharge on May 27.  I had a discussion with her prior to discharge and I felt like since this was a recurrent myelolipoma and I had treated it as well as I had done previously and that because it seemed to be exiting the sacral foramen, that margin was unable to be guaranteed and I felt like she was going to need postoperative radiation.  We will make arrangements for that following her discharge.  CONDITION ON DISCHARGE:  Good. DD:  12/02/00 TD:  12/04/00 Job: [id number]

## 2010-10-29 NOTE — Discharge Summary (Signed)
NAME:  [name], [name]                          ACCOUNT NO.:  [id number]   MEDICAL RECORD NO.:  [id number]                   PATIENT TYPE:  INP   LOCATION:  3005                                 FACILITY:  [facility]   PHYSICIAN:  Michael L. Doy Mince, M.D.           DATE OF BIRTH:  09/11/1929   DATE OF ADMISSION:  05/27/2002  DATE OF DISCHARGE:  05/28/2002                                 DISCHARGE SUMMARY   DISCHARGE DIAGNOSES:  1. Acute right thalamic infarction.  2. Hypertension.  3. Dyslipidemia.  4. History of gout.  5. History of bone related tumors removed from pelvis x2.  6. Status post right total hip replacement.  7. Status post partial hysterectomy.  8. Status post laparotomy after complicated colonoscopy with perforated     colon.   DISCHARGE MEDICATIONS:  1. Aspirin 325 mg q.d.  2. Allopurinol 100 mg q.d.  3. Capoten 50 mg b.i.d.  4. Hydrochlorothiazide 12.5 q.d.  5. Zocor 20 mg q.d.   DIET:  Regular consistency, thin liquids, low salt, low cholesterol.   LABORATORY DATA:  CT of the head performed on admission showing no evidence  of acute intracranial abnormality, mild atrophy.  Chronic small vessel  disease.  MRI of the brain shows two small right thalamic infarcts.  MRA of  the brain shows [name] dominant left vertebral artery, diffuse atherosclerotic  vascular disease.  Carotid Doppler is normal.   [name] 2-D echocardiogram  shows ejection fraction of 55 to 65% with no left  ventricular regional wall motion abnormalities, no embolic source.   EKG shows normal sinus rhythm with mild sinus arrhythmia.  Since last  tracing, axis has shifted slightly and inferior MI is no longer suggested.   CMET is normal except for glucose of 112.  PTT 33, INR 1.0.  CBC shows  hemoglobin 14.9, hematocrit 45.2, white blood cells 7.1, platelets 218, and  RDW slightly elevated at 16.5 with red blood cells 5.49.  Lipid profile  shows elevated cholesterol at 231, triglycerides 130, HDL 47, and  LDL high  at 158.   HISTORY OF PRESENT ILLNESS:  The patient is [name] 77 year old right-handed white  female with history of hypertension who got up to go to the bathroom the  night of admission about 1 [name].m.  While sitting on the commode, she felt  sudden onset of numbness in her left arm and left leg and into her left  face.  Numbness persisted but she was able to ambulate.  She had no change  in her vision or speech. She called EMS and the patient was brought to the  emergency room.  Numbness in her left side has improved but not resolved.  She was admitted to the hospital for stroke work-up.  She was not [name]  candidate for t-PA secondary to no severity of symptoms.   HOSPITAL COURSE:  An MRI did reveal an acute punctate lesion in her  right  thalamus that would be consistent with left-sided numbness.  Source of  stroke was most likely hypertension with blood pressure elevated in the  emergency room at 170/72.  Blood pressure remained slightly high during  hospitalization, ranging from 150s to 190s/70s to 90s.  The patient had her  blood pressure medicine changed last week and is currently on Capoten 50 mg  b.i.d.  It was decided to add low dose hydrochlorothiazide to this for  hypertension as well as stroke prophylaxis.  We may need to increase this  during follow-up.   Deficits were minimal except for mild left-sided numbness which lead to [name]  slightly unsteady gait with left foot drag.  Physical therapy evaluated and  agreed that outpatient physical therapy would be sufficient for follow-up.   The patient had seen Dr. Cherlynn Kaiser in the past.  Currently, Dr. Cherlynn Kaiser is out and  the patient requests [name] new physician at [facility].  Appointment is  made with Dr. [name] for follow-up.  The patient asked to keep him as  primary care physician.  The patient will need stroke risk factor control.   CONDITION ON DISCHARGE:  The patient alert and oriented x3.  Speech is  clear.  No aphagia.   She has no facial weakness and her tongue is midline.  She does have increased sensation on the left side but does not extinguish.  Joint position and graphesthesia are intact.  Gait is steady with slight  drag of left foot.  Romberg is negative.  Chest is clear to auscultation and  heart rate is regular.   PLAN:  1. Discharge home with outpatient PT follow-up.  2. Zocor.  3. Liver function test normal.  Will need follow-up.  4. Low dose hydrochlorothiazide added to current Capoten.  Will need blood     pressure follow-up.  5. Homocystine pending at time of discharge.  Will need follow-up. If     elevated, please add Foltx one p.o. q.d. to lower homocystine.  6. Other stroke risk factor control requested including weight loss.  7. Follow-up with Ike Bene, P.[name]., on the day that Dr. Elvia Collum is there in four to six weeks.  8. Follow-up appointment with Dr. [name] June 11, 2002, at 9:30 [name].m.     Burnetta Sabin, N.P.                         Audrie Lia. Doy Mince, M.D.    SB/MEDQ  D:  05/28/2002  T:  05/29/2002  Job:  [id number]   cc:   Biagio Borg, M.D. [facility]

## 2010-12-11 ENCOUNTER — Emergency Department (HOSPITAL_COMMUNITY): Payer: Medicare Other

## 2010-12-11 ENCOUNTER — Inpatient Hospital Stay (HOSPITAL_COMMUNITY)
Admission: EM | Admit: 2010-12-11 | Discharge: 2010-12-16 | DRG: 445 | Disposition: A | Discharge status: Home / Self Care | Payer: Medicare Other | Attending: Internal Medicine | Admitting: Internal Medicine

## 2010-12-11 DIAGNOSIS — Z96649 Presence of unspecified artificial hip joint: Secondary | ICD-10-CM

## 2010-12-11 DIAGNOSIS — Z882 Allergy status to sulfonamides status: Secondary | ICD-10-CM

## 2010-12-11 DIAGNOSIS — E1142 Type 2 diabetes mellitus with diabetic polyneuropathy: Secondary | ICD-10-CM | POA: Diagnosis present

## 2010-12-11 DIAGNOSIS — R932 Abnormal findings on diagnostic imaging of liver and biliary tract: Secondary | ICD-10-CM

## 2010-12-11 DIAGNOSIS — Z860100 Personal history of colon polyps, unspecified: Secondary | ICD-10-CM

## 2010-12-11 DIAGNOSIS — T40605A Adverse effect of unspecified narcotics, initial encounter: Secondary | ICD-10-CM | POA: Diagnosis not present

## 2010-12-11 DIAGNOSIS — M109 Gout, unspecified: Secondary | ICD-10-CM | POA: Diagnosis present

## 2010-12-11 DIAGNOSIS — Z85828 Personal history of other malignant neoplasm of skin: Secondary | ICD-10-CM

## 2010-12-11 DIAGNOSIS — F411 Generalized anxiety disorder: Secondary | ICD-10-CM | POA: Diagnosis present

## 2010-12-11 DIAGNOSIS — E785 Hyperlipidemia, unspecified: Secondary | ICD-10-CM | POA: Diagnosis present

## 2010-12-11 DIAGNOSIS — K589 Irritable bowel syndrome without diarrhea: Secondary | ICD-10-CM | POA: Diagnosis present

## 2010-12-11 DIAGNOSIS — R404 Transient alteration of awareness: Secondary | ICD-10-CM | POA: Diagnosis not present

## 2010-12-11 DIAGNOSIS — E1149 Type 2 diabetes mellitus with other diabetic neurological complication: Secondary | ICD-10-CM | POA: Diagnosis present

## 2010-12-11 DIAGNOSIS — K449 Diaphragmatic hernia without obstruction or gangrene: Secondary | ICD-10-CM | POA: Diagnosis present

## 2010-12-11 DIAGNOSIS — R112 Nausea with vomiting, unspecified: Secondary | ICD-10-CM

## 2010-12-11 DIAGNOSIS — K8309 Other cholangitis: Secondary | ICD-10-CM | POA: Diagnosis present

## 2010-12-11 DIAGNOSIS — Z96659 Presence of unspecified artificial knee joint: Secondary | ICD-10-CM

## 2010-12-11 DIAGNOSIS — K8051 Calculus of bile duct without cholangitis or cholecystitis with obstruction: Principal | ICD-10-CM | POA: Diagnosis present

## 2010-12-11 DIAGNOSIS — Z8673 Personal history of transient ischemic attack (TIA), and cerebral infarction without residual deficits: Secondary | ICD-10-CM

## 2010-12-11 DIAGNOSIS — Y9210 Unspecified residential institution as the place of occurrence of the external cause: Secondary | ICD-10-CM | POA: Diagnosis not present

## 2010-12-11 DIAGNOSIS — Z86718 Personal history of other venous thrombosis and embolism: Secondary | ICD-10-CM

## 2010-12-11 DIAGNOSIS — N390 Urinary tract infection, site not specified: Secondary | ICD-10-CM | POA: Diagnosis present

## 2010-12-11 DIAGNOSIS — I10 Essential (primary) hypertension: Secondary | ICD-10-CM | POA: Diagnosis present

## 2010-12-11 DIAGNOSIS — Z7982 Long term (current) use of aspirin: Secondary | ICD-10-CM

## 2010-12-11 DIAGNOSIS — Z79899 Other long term (current) drug therapy: Secondary | ICD-10-CM

## 2010-12-11 DIAGNOSIS — R1011 Right upper quadrant pain: Secondary | ICD-10-CM

## 2010-12-11 DIAGNOSIS — K219 Gastro-esophageal reflux disease without esophagitis: Secondary | ICD-10-CM | POA: Diagnosis present

## 2010-12-11 LAB — DIFFERENTIAL
Basophils Absolute: 0 10*3/uL (ref 0.0–0.1)
Basophils Relative: 0 % (ref 0–1)
Eosinophils Absolute: 0.1 10*3/uL (ref 0.0–0.7)
Eosinophils Relative: 1 % (ref 0–5)
Lymphocytes Relative: 13 % (ref 12–46)
Lymphs Abs: 1.5 10*3/uL (ref 0.7–4.0)
Monocytes Absolute: 0.7 10*3/uL (ref 0.1–1.0)
Monocytes Relative: 6 % (ref 3–12)
Neutro Abs: 9.2 10*3/uL — ABNORMAL HIGH (ref 1.7–7.7)
Neutrophils Relative %: 80 % — ABNORMAL HIGH (ref 43–77)

## 2010-12-11 LAB — COMPREHENSIVE METABOLIC PANEL
ALT: 44 U/L — ABNORMAL HIGH (ref 0–35)
AST: 79 U/L — ABNORMAL HIGH (ref 0–37)
Albumin: 3.3 g/dL — ABNORMAL LOW (ref 3.5–5.2)
Alkaline Phosphatase: 97 U/L (ref 39–117)
BUN: 34 mg/dL — ABNORMAL HIGH (ref 6–23)
CO2: 24 mEq/L (ref 19–32)
Calcium: 9.3 mg/dL (ref 8.4–10.5)
Chloride: 104 mEq/L (ref 96–112)
Creatinine, Ser: 1.07 mg/dL (ref 0.50–1.10)
GFR calc Af Amer: >60 mL/min (ref >60)
GFR calc non Af Amer: 50 mL/min — ABNORMAL LOW (ref >60)
Glucose, Bld: 154 mg/dL — ABNORMAL HIGH (ref 70–99)
Potassium: 3.7 mEq/L (ref 3.5–5.1)
Sodium: 142 mEq/L (ref 135–145)
Total Bilirubin: 1.1 mg/dL (ref 0.3–1.2)
Total Protein: 7.2 g/dL (ref 6.0–8.3)

## 2010-12-11 LAB — CBC
HCT: 44.5 % (ref 36.0–46.0)
Hemoglobin: 14.1 g/dL (ref 12.0–15.0)
MCH: 28.8 pg (ref 26.0–34.0)
MCHC: 31.7 g/dL (ref 30.0–36.0)
MCV: 91 fL (ref 78.0–100.0)
Platelets: 162 10*3/uL (ref 150–400)
RBC: 4.89 MIL/uL (ref 3.87–5.11)
RDW: 15.9 % — ABNORMAL HIGH (ref 11.5–15.5)
WBC: 11.5 10*3/uL — ABNORMAL HIGH (ref 4.0–10.5)

## 2010-12-11 LAB — LIPASE, BLOOD: Lipase: 28 U/L (ref 11–59)

## 2010-12-11 LAB — URINALYSIS, ROUTINE W REFLEX MICROSCOPIC
Bilirubin Urine: NEGATIVE
Glucose, UA: NEGATIVE mg/dL
Hgb urine dipstick: NEGATIVE
Ketones, ur: NEGATIVE mg/dL
Nitrite: NEGATIVE
Protein, ur: NEGATIVE mg/dL
Specific Gravity, Urine: 1.016 (ref 1.005–1.030)
Urobilinogen, UA: 0.2 mg/dL (ref 0.0–1.0)
pH: 5 (ref 5.0–8.0)

## 2010-12-11 LAB — URINE MICROSCOPIC-ADD ON

## 2010-12-11 MED ORDER — IOHEXOL 300 MG/ML  SOLN
100.0000 mL | Freq: Once | INTRAMUSCULAR | Status: AC | PRN
  Administered 2010-12-11: 100 mL via INTRAVENOUS

## 2010-12-12 ENCOUNTER — Inpatient Hospital Stay (HOSPITAL_COMMUNITY): Payer: Medicare Other

## 2010-12-12 DIAGNOSIS — K8051 Calculus of bile duct without cholangitis or cholecystitis with obstruction: Secondary | ICD-10-CM

## 2010-12-12 DIAGNOSIS — R932 Abnormal findings on diagnostic imaging of liver and biliary tract: Secondary | ICD-10-CM

## 2010-12-12 DIAGNOSIS — K8309 Other cholangitis: Secondary | ICD-10-CM

## 2010-12-12 DIAGNOSIS — R1011 Right upper quadrant pain: Secondary | ICD-10-CM

## 2010-12-12 LAB — CBC
HCT: 42.1 % (ref 36.0–46.0)
Hemoglobin: 13.1 g/dL (ref 12.0–15.0)
MCH: 29 pg (ref 26.0–34.0)
MCHC: 31.1 g/dL (ref 30.0–36.0)
MCV: 93.1 fL (ref 78.0–100.0)
Platelets: 153 10*3/uL (ref 150–400)
RBC: 4.52 MIL/uL (ref 3.87–5.11)
RDW: 16.3 % — ABNORMAL HIGH (ref 11.5–15.5)
WBC: 19.7 10*3/uL — ABNORMAL HIGH (ref 4.0–10.5)

## 2010-12-12 LAB — COMPREHENSIVE METABOLIC PANEL
ALT: 147 U/L — ABNORMAL HIGH (ref 0–35)
AST: 181 U/L — ABNORMAL HIGH (ref 0–37)
Albumin: 2.9 g/dL — ABNORMAL LOW (ref 3.5–5.2)
Alkaline Phosphatase: 91 U/L (ref 39–117)
BUN: 32 mg/dL — ABNORMAL HIGH (ref 6–23)
CO2: 28 mEq/L (ref 19–32)
Calcium: 8.6 mg/dL (ref 8.4–10.5)
Chloride: 103 mEq/L (ref 96–112)
Creatinine, Ser: 1.24 mg/dL — ABNORMAL HIGH (ref 0.50–1.10)
GFR calc Af Amer: 51 mL/min — ABNORMAL LOW (ref >60)
GFR calc non Af Amer: 42 mL/min — ABNORMAL LOW (ref >60)
Glucose, Bld: 161 mg/dL — ABNORMAL HIGH (ref 70–99)
Potassium: 4.5 mEq/L (ref 3.5–5.1)
Sodium: 141 mEq/L (ref 135–145)
Total Bilirubin: 3.3 mg/dL — ABNORMAL HIGH (ref 0.3–1.2)
Total Protein: 6.4 g/dL (ref 6.0–8.3)

## 2010-12-12 LAB — GLUCOSE, CAPILLARY
Glucose-Capillary: 177 mg/dL — ABNORMAL HIGH (ref 70–99)
Glucose-Capillary: 217 mg/dL — ABNORMAL HIGH (ref 70–99)
Glucose-Capillary: 161 mg/dL — ABNORMAL HIGH (ref 70–99)
Glucose-Capillary: 176 mg/dL — ABNORMAL HIGH (ref 70–99)
Glucose-Capillary: 118 mg/dL — ABNORMAL HIGH (ref 70–99)
Glucose-Capillary: 124 mg/dL — ABNORMAL HIGH (ref 70–99)
Glucose-Capillary: 172 mg/dL — ABNORMAL HIGH (ref 70–99)
Glucose-Capillary: 178 mg/dL — ABNORMAL HIGH (ref 70–99)

## 2010-12-12 LAB — MRSA PCR SCREENING: MRSA by PCR: NEGATIVE

## 2010-12-13 DIAGNOSIS — R932 Abnormal findings on diagnostic imaging of liver and biliary tract: Secondary | ICD-10-CM

## 2010-12-13 DIAGNOSIS — K8309 Other cholangitis: Secondary | ICD-10-CM

## 2010-12-13 DIAGNOSIS — R1011 Right upper quadrant pain: Secondary | ICD-10-CM

## 2010-12-13 DIAGNOSIS — K8051 Calculus of bile duct without cholangitis or cholecystitis with obstruction: Secondary | ICD-10-CM

## 2010-12-13 LAB — GLUCOSE, CAPILLARY
Glucose-Capillary: 106 mg/dL — ABNORMAL HIGH (ref 70–99)
Glucose-Capillary: 112 mg/dL — ABNORMAL HIGH (ref 70–99)
Glucose-Capillary: 126 mg/dL — ABNORMAL HIGH (ref 70–99)
Glucose-Capillary: 127 mg/dL — ABNORMAL HIGH (ref 70–99)
Glucose-Capillary: 146 mg/dL — ABNORMAL HIGH (ref 70–99)
Glucose-Capillary: 167 mg/dL — ABNORMAL HIGH (ref 70–99)

## 2010-12-13 LAB — COMPREHENSIVE METABOLIC PANEL
ALT: 162 U/L — ABNORMAL HIGH (ref 0–35)
AST: 160 U/L — ABNORMAL HIGH (ref 0–37)
Albumin: 2.6 g/dL — ABNORMAL LOW (ref 3.5–5.2)
Alkaline Phosphatase: 95 U/L (ref 39–117)
BUN: 41 mg/dL — ABNORMAL HIGH (ref 6–23)
CO2: 29 mEq/L (ref 19–32)
Calcium: 8.2 mg/dL — ABNORMAL LOW (ref 8.4–10.5)
Chloride: 105 mEq/L (ref 96–112)
Creatinine, Ser: 1.67 mg/dL — ABNORMAL HIGH (ref 0.50–1.10)
GFR calc Af Amer: 36 mL/min — ABNORMAL LOW (ref >60)
GFR calc non Af Amer: 30 mL/min — ABNORMAL LOW (ref >60)
Glucose, Bld: 122 mg/dL — ABNORMAL HIGH (ref 70–99)
Potassium: 4.7 mEq/L (ref 3.5–5.1)
Sodium: 140 mEq/L (ref 135–145)
Total Bilirubin: 4.4 mg/dL — ABNORMAL HIGH (ref 0.3–1.2)
Total Protein: 6.1 g/dL (ref 6.0–8.3)

## 2010-12-13 LAB — CBC
HCT: 39.4 % (ref 36.0–46.0)
Hemoglobin: 12.3 g/dL (ref 12.0–15.0)
MCH: 29.1 pg (ref 26.0–34.0)
MCHC: 31.2 g/dL (ref 30.0–36.0)
MCV: 93.1 fL (ref 78.0–100.0)
Platelets: 141 10*3/uL — ABNORMAL LOW (ref 150–400)
RBC: 4.23 MIL/uL (ref 3.87–5.11)
RDW: 16.9 % — ABNORMAL HIGH (ref 11.5–15.5)
WBC: 12.6 10*3/uL — ABNORMAL HIGH (ref 4.0–10.5)

## 2010-12-14 LAB — CBC
HCT: 37.8 % (ref 36.0–46.0)
Hemoglobin: 12.1 g/dL (ref 12.0–15.0)
MCH: 29.5 pg (ref 26.0–34.0)
MCHC: 32 g/dL (ref 30.0–36.0)
MCV: 92.2 fL (ref 78.0–100.0)
Platelets: 123 10*3/uL — ABNORMAL LOW (ref 150–400)
RBC: 4.1 MIL/uL (ref 3.87–5.11)
RDW: 16.6 % — ABNORMAL HIGH (ref 11.5–15.5)
WBC: 7.8 10*3/uL (ref 4.0–10.5)

## 2010-12-14 LAB — GLUCOSE, CAPILLARY
Glucose-Capillary: 131 mg/dL — ABNORMAL HIGH (ref 70–99)
Glucose-Capillary: 140 mg/dL — ABNORMAL HIGH (ref 70–99)
Glucose-Capillary: 140 mg/dL — ABNORMAL HIGH (ref 70–99)
Glucose-Capillary: 147 mg/dL — ABNORMAL HIGH (ref 70–99)
Glucose-Capillary: 157 mg/dL — ABNORMAL HIGH (ref 70–99)
Glucose-Capillary: 179 mg/dL — ABNORMAL HIGH (ref 70–99)

## 2010-12-14 LAB — COMPREHENSIVE METABOLIC PANEL
ALT: 133 U/L — ABNORMAL HIGH (ref 0–35)
AST: 127 U/L — ABNORMAL HIGH (ref 0–37)
Albumin: 2.3 g/dL — ABNORMAL LOW (ref 3.5–5.2)
Alkaline Phosphatase: 116 U/L (ref 39–117)
BUN: 29 mg/dL — ABNORMAL HIGH (ref 6–23)
CO2: 29 mEq/L (ref 19–32)
Calcium: 8.5 mg/dL (ref 8.4–10.5)
Chloride: 106 mEq/L (ref 96–112)
Creatinine, Ser: 1.14 mg/dL — ABNORMAL HIGH (ref 0.50–1.10)
GFR calc Af Amer: 56 mL/min — ABNORMAL LOW (ref >60)
GFR calc non Af Amer: 46 mL/min — ABNORMAL LOW (ref >60)
Glucose, Bld: 125 mg/dL — ABNORMAL HIGH (ref 70–99)
Potassium: 4.2 mEq/L (ref 3.5–5.1)
Sodium: 141 mEq/L (ref 135–145)
Total Bilirubin: 5 mg/dL — ABNORMAL HIGH (ref 0.3–1.2)
Total Protein: 5.8 g/dL — ABNORMAL LOW (ref 6.0–8.3)

## 2010-12-15 DIAGNOSIS — K8051 Calculus of bile duct without cholangitis or cholecystitis with obstruction: Secondary | ICD-10-CM

## 2010-12-15 DIAGNOSIS — K8309 Other cholangitis: Secondary | ICD-10-CM

## 2010-12-15 DIAGNOSIS — R932 Abnormal findings on diagnostic imaging of liver and biliary tract: Secondary | ICD-10-CM

## 2010-12-15 DIAGNOSIS — R1011 Right upper quadrant pain: Secondary | ICD-10-CM

## 2010-12-15 LAB — COMPREHENSIVE METABOLIC PANEL
ALT: 133 U/L — ABNORMAL HIGH (ref 0–35)
AST: 123 U/L — ABNORMAL HIGH (ref 0–37)
Albumin: 2.3 g/dL — ABNORMAL LOW (ref 3.5–5.2)
Alkaline Phosphatase: 132 U/L — ABNORMAL HIGH (ref 39–117)
BUN: 25 mg/dL — ABNORMAL HIGH (ref 6–23)
CO2: 30 mEq/L (ref 19–32)
Calcium: 8.6 mg/dL (ref 8.4–10.5)
Chloride: 103 mEq/L (ref 96–112)
Creatinine, Ser: 1.05 mg/dL (ref 0.50–1.10)
GFR calc Af Amer: >60 mL/min (ref >60)
GFR calc non Af Amer: 51 mL/min — ABNORMAL LOW (ref >60)
Glucose, Bld: 128 mg/dL — ABNORMAL HIGH (ref 70–99)
Potassium: 3.8 mEq/L (ref 3.5–5.1)
Sodium: 139 mEq/L (ref 135–145)
Total Bilirubin: 4.8 mg/dL — ABNORMAL HIGH (ref 0.3–1.2)
Total Protein: 6 g/dL (ref 6.0–8.3)

## 2010-12-15 LAB — GLUCOSE, CAPILLARY
Glucose-Capillary: 112 mg/dL — ABNORMAL HIGH (ref 70–99)
Glucose-Capillary: 115 mg/dL — ABNORMAL HIGH (ref 70–99)
Glucose-Capillary: 119 mg/dL — ABNORMAL HIGH (ref 70–99)
Glucose-Capillary: 136 mg/dL — ABNORMAL HIGH (ref 70–99)
Glucose-Capillary: 136 mg/dL — ABNORMAL HIGH (ref 70–99)
Glucose-Capillary: 136 mg/dL — ABNORMAL HIGH (ref 70–99)
Glucose-Capillary: 153 mg/dL — ABNORMAL HIGH (ref 70–99)

## 2010-12-16 ENCOUNTER — Other Ambulatory Visit: Payer: Self-pay

## 2010-12-16 DIAGNOSIS — R1011 Right upper quadrant pain: Secondary | ICD-10-CM

## 2010-12-16 DIAGNOSIS — K8309 Other cholangitis: Secondary | ICD-10-CM

## 2010-12-16 DIAGNOSIS — K8051 Calculus of bile duct without cholangitis or cholecystitis with obstruction: Secondary | ICD-10-CM

## 2010-12-16 DIAGNOSIS — R932 Abnormal findings on diagnostic imaging of liver and biliary tract: Secondary | ICD-10-CM

## 2010-12-16 DIAGNOSIS — R17 Unspecified jaundice: Secondary | ICD-10-CM

## 2010-12-16 LAB — GLUCOSE, CAPILLARY
Glucose-Capillary: 102 mg/dL — ABNORMAL HIGH (ref 70–99)
Glucose-Capillary: 112 mg/dL — ABNORMAL HIGH (ref 70–99)
Glucose-Capillary: 148 mg/dL — ABNORMAL HIGH (ref 70–99)

## 2010-12-16 LAB — COMPREHENSIVE METABOLIC PANEL
ALT: 110 U/L — ABNORMAL HIGH (ref 0–35)
AST: 80 U/L — ABNORMAL HIGH (ref 0–37)
Albumin: 2.4 g/dL — ABNORMAL LOW (ref 3.5–5.2)
Alkaline Phosphatase: 142 U/L — ABNORMAL HIGH (ref 39–117)
BUN: 25 mg/dL — ABNORMAL HIGH (ref 6–23)
CO2: 34 mEq/L — ABNORMAL HIGH (ref 19–32)
Calcium: 9 mg/dL (ref 8.4–10.5)
Chloride: 99 mEq/L (ref 96–112)
Creatinine, Ser: 1.04 mg/dL (ref 0.50–1.10)
GFR calc Af Amer: >60 mL/min (ref >60)
GFR calc non Af Amer: 51 mL/min — ABNORMAL LOW (ref >60)
Glucose, Bld: 90 mg/dL (ref 70–99)
Potassium: 4.1 mEq/L (ref 3.5–5.1)
Sodium: 140 mEq/L (ref 135–145)
Total Bilirubin: 3.7 mg/dL — ABNORMAL HIGH (ref 0.3–1.2)
Total Protein: 6.3 g/dL (ref 6.0–8.3)

## 2010-12-17 ENCOUNTER — Telehealth: Payer: Self-pay | Admitting: Internal Medicine

## 2010-12-17 NOTE — Telephone Encounter (Signed)
Patient wanted to make sure that it was OK to take a generic antibiotic.  I have advised her it is fine

## 2010-12-21 NOTE — H&P (Signed)
[name], [name]                ACCOUNT NO.:  [id number]  MEDICAL RECORD NO.:  [id number]  LOCATION:  [id number]                         FACILITY:  [facility]  PHYSICIAN:  Gatha Mayer, MD,FACGDATE OF BIRTH:  22-Nov-1929  DATE OF ADMISSION:  12/11/2010 DATE OF DISCHARGE:                             HISTORY & PHYSICAL   REASON FOR ADMISSION/CHIEF COMPLAINT:  Epigastric pain, nausea with dilated bile ducts.  HISTORY:  This is a 77 year old white woman who was in her usual state of health with difficulties with osteoarthritis and some other chronic medical conditions who felt well this morning but after a breakfast of biscuit and gravy developed acute epigastric and right upper quadrant pain with nausea and attempt to vomit but was unable to.  She then ate some cantaloupe but she said that is when things got really bad.  The pain intensified.  She had persistent nausea.  She has not had fever or jaundice.  She came to the emergency room where 1 mg IV Dilaudid had help to provide relief.  She has had two doses of that but now she is saying the pain is increasing again and she wants some more pain medication.  She has also been given Zofran.  Overall, she feels better but she still feels the pain.  It radiates around both sides and flanks into her back she says.  She says it is not like problems she has had before, specifically she had right upper quadrant pain and underwent an ERCP with biliary sphincterotomy by Dr. Scarlette Shorts back in 2009, due to a benign ampullary stenosis.  CT scanning tonight has shown dilated intra and extra hepatic duct with thickening of the distal common bile duct.  She has an anterior abdominal wall hernia without obstruction and chronic thickening of subcutaneous fat planes.  Degenerative disk changes in the L3-4 level and other changes as well.  She denies dysphagia or chronic heartburn.  She has been belching and regurgitating a little bit lately she says.   She has chronic abnormal defecation having had multiple abdominal bowel surgeries.  She has had multiple bowel movements empty and has common problems with incomplete defecation sensation.  There is no diarrhea or rectal bleeding.  CURRENT MEDICATIONS: 1. Allopurinol takes 200 mg one morning, 100 mg the other morning in     an alternating fashion. 2. Furosemide 40 mg daily. 3. Lisinopril 40 mg daily. 4. Enteric-coated aspirin 325 mg daily. 5. Amlodipine 5 mg daily.  ALLERGIES:  SULFA, CODEINE, ZOCOR, CRESTOR, and PERCOCET.  PAST MEDICAL HISTORY: 1. Morbid obesity. 2. Colon polyps with prior colonic perforation in the 1990s. 3. Anxiety. 4. Gout. 5. Dyslipidemia. 6. Hypertension. 7. Right thalamic stroke December 2003. 8. Irritable bowel syndrome. 9. Diabetes mellitus type 2 with peripheral neuropathy. 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 11.History of rheumatic fever. 12.History of bilateral knee replacement. 13.History of hip replacement. 14.Prior DVT. 15.History of skin cancer. 16.Hiatal hernia. 17.Laparotomy with repair of colonic perforation in 1995. 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 20.Suspected essential tremor.  FAMILY HISTORY:  Noncontributory to the  current problem.  SOCIAL HISTORY:  She is married, has 3 children overall, 2 sons in the area, 1 of her 3 died with meningitis.  She has not been a smoker.  She is here with her husband tonight.  She does not use alcohol or drugs. 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  REVIEW OF SYSTEMS:  Notable for the osteoarthritis and joint problems. She ambulates with a walker.  She has some decreased hearing.  She denies any recent cough, cold, upper respiratory complaints, urinary complaints, fever, true chills, though she has felt cold at times.  All other review of systems appear negative at  this time.  PHYSICAL EXAMINATION:  GENERAL:  An elderly, morbidly obese white woman who is in pain and uncomfortable.  She is able to participate in history without difficulty, however. VITAL SIGNS:  Her blood pressure is 137/46 with a pulse of 88, respirations 18, temperature is 97.7. HEENT:  The eyes are anicteric.  Pupils are round and reactive to light. The mouth shows dentures, otherwise, free of lesions.  Posterior pharynx clear. NECK:  Supple without mass or thyromegaly. LUNGS:  Diffusely decreased breath sounds throughout but there are no rales. HEART:  S1-S2.  No rubs, murmurs, or gallops.  I see no jugular venous distention. ABDOMEN:  Obese.  There is an anterior wall hernia.  No surgical scars. Bowel sounds are present.  She is tender to a mild degree in her right upper quadrant with deep palpation.  Bowel sounds are normal in sound and pitch and frequency.  There is no other organomegaly or mass though the size of her abdomen makes that difficult to assess completely. LOWER EXTREMITIES:  Trace peripheral edema. SKIN:  Warm and dry without acute rash.  She is awake and alert and oriented x3.  Cranial nerves II-XII appear grossly intact. LYMPH NODES:  No neck or supraclavicular nodes palpated.  LABORATORY DATA:  Lipase 28, glucose 154, creatinine 1.07, AST is 79, ALT 44, albumin 3.3.  The metabolic panel is otherwise normal.  Her white count is 11.5, hemoglobin 14.1, platelets normal.  There is a left shift.  Her urine microscopic shows 21-50 white cells, there are large leukocytes, this has been cultured that was not a cath specimen.  CT scanning as above.  ASSESSMENT: 1. Epigastric right upper quadrant pain with nausea and attempted     vomiting, unable to do so.  In the setting of dilated intra and     extra hepatic biliary ducts which is probably chronic, thickening     in the distal common bile duct of unclear etiology.  Question if     this could represent some  sort of cholangitis.  She could have     choledocholithiasis.  CT scan did not show gallstones but the     gallbladder is dilated.  Though it seems like she has some sort of     biliary pathology here.  I think most likely would be a stone in     the distal common bile duct perhaps.  Perhaps, she has scar down     her prior biliary sphincterotomy.  I will admit her to the     hospital.  We will hydrate her, pain control, nausea control and     intravenous antibiotic coverage will be given.  I think she will     probably end up needing an ERCP at some point but would proceed to     MRCP next tomorrow when she is in  less pain and can hold her     breath.  Further plans pending that. 2. Abnormal urinalysis question urinary tract infection, await     culture.  She would be covered with antibiotics for the biliary     process that is suspected and that should help cover the possible     urinary tract infection although I do not think that, that is the     cause of her symptoms. 3. Morbid obesity. 4. Hypertension.  We will continue her home medications. 5. Diabetes mellitus.  She is not on therapy for that.  We will check     blood sugars.  I do not think she needs a sliding scale at this     point but we will see.  We can prescribe sliding insulin scale     sensitive. 6. Further plans pending clinical course.     Gatha Mayer, MD,FACG     CEG/MEDQ  D:  12/11/2010  T:  12/12/2010  Job:  [id number]  cc:   Biagio Borg, MD  Electronically Signed by Silvano Rusk MDFACG on 12/21/2010 03:35:23 PM

## 2010-12-24 ENCOUNTER — Other Ambulatory Visit (INDEPENDENT_AMBULATORY_CARE_PROVIDER_SITE_OTHER): Payer: Medicare Other

## 2010-12-24 DIAGNOSIS — R17 Unspecified jaundice: Secondary | ICD-10-CM

## 2010-12-24 LAB — COMPREHENSIVE METABOLIC PANEL
ALT: 54 U/L — ABNORMAL HIGH (ref 0–35)
AST: 48 U/L — ABNORMAL HIGH (ref 0–37)
Albumin: 3.4 g/dL — ABNORMAL LOW (ref 3.5–5.2)
Alkaline Phosphatase: 136 U/L — ABNORMAL HIGH (ref 39–117)
BUN: 37 mg/dL — ABNORMAL HIGH (ref 6–23)
CO2: 28 mEq/L (ref 19–32)
Calcium: 8.6 mg/dL (ref 8.4–10.5)
Chloride: 106 mEq/L (ref 96–112)
Creatinine, Ser: 1.2 mg/dL (ref 0.4–1.2)
GFR: 46.08 mL/min — ABNORMAL LOW (ref >60.00)
Glucose, Bld: 137 mg/dL — ABNORMAL HIGH (ref 70–99)
Potassium: 4.4 mEq/L (ref 3.5–5.1)
Sodium: 141 mEq/L (ref 135–145)
Total Bilirubin: 1.3 mg/dL — ABNORMAL HIGH (ref 0.3–1.2)
Total Protein: 7.6 g/dL (ref 6.0–8.3)

## 2010-12-28 ENCOUNTER — Telehealth: Payer: Self-pay | Admitting: *Deleted

## 2010-12-28 NOTE — Telephone Encounter (Signed)
Patient inquiring about medication. Pt confused about taking Amlodipine 5mg  w/Lisinopril. Informed Pt that she was given Amlodipine at her 08/17/2010 OV w/Dr Jenny Reichmann in addition to her Lisinopril. Pt understood.

## 2010-12-29 ENCOUNTER — Other Ambulatory Visit: Payer: Self-pay | Admitting: *Deleted

## 2010-12-29 DIAGNOSIS — R1013 Epigastric pain: Secondary | ICD-10-CM

## 2010-12-29 DIAGNOSIS — R110 Nausea: Secondary | ICD-10-CM

## 2011-01-05 ENCOUNTER — Encounter: Payer: Self-pay | Admitting: *Deleted

## 2011-01-06 ENCOUNTER — Other Ambulatory Visit (INDEPENDENT_AMBULATORY_CARE_PROVIDER_SITE_OTHER): Payer: Medicare Other

## 2011-01-06 ENCOUNTER — Encounter: Payer: Self-pay | Admitting: Internal Medicine

## 2011-01-06 ENCOUNTER — Ambulatory Visit (INDEPENDENT_AMBULATORY_CARE_PROVIDER_SITE_OTHER): Payer: Medicare Other | Admitting: Internal Medicine

## 2011-01-06 DIAGNOSIS — R7401 Elevation of levels of liver transaminase levels: Secondary | ICD-10-CM

## 2011-01-06 DIAGNOSIS — E669 Obesity, unspecified: Secondary | ICD-10-CM

## 2011-01-06 DIAGNOSIS — K8050 Calculus of bile duct without cholangitis or cholecystitis without obstruction: Secondary | ICD-10-CM

## 2011-01-06 DIAGNOSIS — R7402 Elevation of levels of lactic acid dehydrogenase (LDH): Secondary | ICD-10-CM

## 2011-01-06 LAB — HEPATIC FUNCTION PANEL
ALT: 35 U/L (ref 0–35)
AST: 41 U/L — ABNORMAL HIGH (ref 0–37)
Albumin: 3.9 g/dL (ref 3.5–5.2)
Alkaline Phosphatase: 116 U/L (ref 39–117)
Bilirubin, Direct: 0.3 mg/dL (ref 0.0–0.3)
Total Bilirubin: 1.2 mg/dL (ref 0.3–1.2)
Total Protein: 8.4 g/dL — ABNORMAL HIGH (ref 6.0–8.3)

## 2011-01-06 NOTE — Progress Notes (Signed)
HISTORY OF PRESENT ILLNESS:  [name] is a 77 y.o. female with multiple significant medical problems as listed below. She presents today, post hospitalization, for management of choledocholithiasis. The patient was a long-standing GI patient of Dr. Lyla Son who managed her GERD and colon cancer screening. I saw the patient in 2008 for problems with recurrent right upper quadrant and right scapular pain associated with biliary dilation on imaging. Normal liver tests at that time. She subsequently underwent ERCP in April of 2008. She was felt to have ampullary stenosis and was provided with a biliary sphincterotomy. Postprocedure followup demonstrated resolution of pain. She has not been seen since. On 12/11/2010 she presented to the hospital with severe epigastric and right upper quadrant pain. Evaluation at that time remarkable for mild elevation of liver tests, mild leukocytosis with left shift, and urinary tract infection. CT scan of the abdomen and pelvis revealed biliary ductal dilation with thickening of the distal bile duct. No evidence of gallstones. She subsequently underwent ERCP with Dr. Leroy Kennedy on 12/12/2010. She was found to have several stones including a large stone. Smaller stones extracted. Largest stone, measuring up to 2 cm, remained in the common hepatic duct. Subsequently a 9 cm/85 [place] biliary stent was placed to ensure drainage. She was subsequently discharged home on 12/16/2010 in good condition. Since that time she has had some abdominal discomfort which has gradually improved. Followup liver studies July 13 revealing ongoing mildly abnormal LFTs with SGOT 48, SGPT 54, alkaline phosphatase 136, total bilirubin 1.3.  REVIEW OF SYSTEMS:  All non-GI ROS negative except for arthritis pain  Past Medical History  Diagnosis Date  . Hypertension   . Hyperlipidemia   . Morbid obesity   . Osteoarthritis   . Gout   . CVA (cerebral infarction)   . IBS (irritable bowel syndrome)    . Diverticulosis   . Esophageal stricture   . Colon polyps     hyperplastic  . Anxiety   . Hiatal hernia     4 cm  . DVT (deep vein thrombosis) in pregnancy   . Skin cancer     Past Surgical History  Procedure Date  . Abdominal hysterectomy   . Tonsillectomy   . Bile duct stent placement   . Pelvic bone tumor removal   . Colonic perforation repair   . Total hip arthroplasty     right  . Replacement total knee bilateral     Social History [name]  reports that she has never smoked. She does not have any smokeless tobacco history on file. She reports that she does not drink alcohol or use illicit drugs.  family history includes Breast cancer in her sister; Kidney failure in her sister; Prostate cancer in her brother; and Stroke in her mother.  There is no history of Colon cancer.  Allergies  Allergen Reactions  . Atorvastatin     REACTION: myalgias  . Codeine   . Oxycodone-Acetaminophen     REACTION: confusion, fatigue  . Rosuvastatin     REACTION: leg weakness  . Simvastatin     REACTION: leg weakness  . Sulfonamide Derivatives        PHYSICAL EXAMINATION: Vital signs: BP 170/72  Pulse 76  Ht 5\' 7"  (1.702 m)  Wt 252 lb (114.306 kg)  BMI 39.47 kg/m2  Constitutional: Obese but generally well-appearing elderly female, no acute distress Psychiatric: alert and oriented x3, cooperative Eyes: extraocular movements intact, anicteric, conjunctiva pink Mouth: oral pharynx moist, no lesions  Neck: supple no lymphadenopathy Cardiovascular: heart regular rate and rhythm, no murmur Lungs: clear to auscultation bilaterally Abdomen: soft, obese, nontender, nondistended, no obvious ascites, no peritoneal signs, normal bowel sounds, no organomegaly Extremities: no lower extremity edema bilaterally. Osteoarthritic changes in the knees and hands Skin: no lesions on visible extremities Neuro: No focal deficits.   ASSESSMENT:  #5. A 77 year old female with multiple  medical problems who presents today after recent hospitalization with symptomatic choledocholithiasis. Prior history of ampullary stenosis for which she underwent ERCP with sphincterotomy in 2008. Recent ERCP, July 1, with partial clearance of the biliary tree of stones with biliary stent placement for residual large choledocholithe. Currently with minimal symptoms. #2. Multiple comorbidities #3. Morbid obesity   PLAN:  #1. ERCP with the hopes of ductal clearance in about 3-4 weeks. This will be performed at the hospital. She will require general anesthesia assistance for adequate safe sedation. She is high-risk.The nature of the procedure, as well as the risks, benefits, and alternatives were carefully and thoroughly reviewed with the patient. Ample time for discussion and questions allowed. The patient understood, was satisfied, and agreed to proceed. told to be prepared for either same-day discharge or overnight stay. #2. Cipro 400 mg IV preprocedure #3. Followup liver test today #4. Surgical evaluation with Dr. Excell Seltzer, who has seen her previously. I suspect that she will need cholecystectomy post ductal clearance. #5. Advised to contact the office in the interim if she develops problems with jaundice, fevers, dark urine, or recurrent pain

## 2011-01-06 NOTE — Patient Instructions (Signed)
ERCP [facility] 02/01/11  12:30 pm arrive at 10:30 am Referral to Dr. Excell Seltzer   They will contact you with your appointment date and time. Go to basement floor for your labs today.

## 2011-01-10 ENCOUNTER — Telehealth: Payer: Self-pay | Admitting: *Deleted

## 2011-01-10 NOTE — Telephone Encounter (Signed)
Called patient and left message with Date and Time of her appt. With Dr. Excell Seltzer

## 2011-01-10 NOTE — Telephone Encounter (Signed)
Message copied by Noralee Stain on Mon Jan 10, 2011  9:45 AM ------      Message from: Jackquline Denmark      Created: Fri Jan 07, 2011 10:18 AM      Regarding: RE: Lap. cholecystectomy        Pt. Is scheduled to see Dr. Excell Seltzer on 01/21/11 @ 1:30pm, arrive at 1:00pm.  Pt. Has not been notified, if you have any questions please give [place] a call @ [phone].            Earnest Bailey      ----- Message -----         From: Randye Lobo, CMA         Sent: 01/06/2011  11:44 AM           To: Jackquline Denmark      Subject: Lap. cholecystectomy                                     Referral for Lap. Cholecystectomy with Dr. Leotis Shames you.

## 2011-01-20 ENCOUNTER — Encounter (INDEPENDENT_AMBULATORY_CARE_PROVIDER_SITE_OTHER): Payer: Self-pay | Admitting: General Surgery

## 2011-01-21 ENCOUNTER — Ambulatory Visit (INDEPENDENT_AMBULATORY_CARE_PROVIDER_SITE_OTHER): Payer: Medicare Other | Admitting: General Surgery

## 2011-01-21 ENCOUNTER — Encounter (INDEPENDENT_AMBULATORY_CARE_PROVIDER_SITE_OTHER): Payer: Self-pay | Admitting: General Surgery

## 2011-01-21 VITALS — BP 146/103 | HR 74 | Temp 97.5°F | Ht 66.0 in | Wt 254.6 lb

## 2011-01-21 DIAGNOSIS — K8040 Calculus of bile duct with cholecystitis, unspecified, without obstruction: Secondary | ICD-10-CM

## 2011-01-21 NOTE — Patient Instructions (Signed)
Call for questions or if you do not hear from Dr. Excell Seltzer about your surgery scheduling next week.

## 2011-01-21 NOTE — Progress Notes (Signed)
Subjective:   Abdominal pain and gallstones  Patient ID: [name], female   DOB: 15-Nov-1929, 77 y.o.   MRN: [id number]  HPI Patient is a pleasant 77 year old female here for the courtesy of Dr. Henrene Pastor for evaluation for gallstones. About 3 weeks ago she presented to the hospital with acute severe epigastric abdominal pain. She was found to have elevated LFTs. CT scan showed dilated intrahepatic for hepatic bile ducts. 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 She did not have documented gallstones at that time. The patient subsequently underwent ERCP with findings of multiple common bile duct stones she had a large stone in the hepatic duct that was not removed and a stent was replaced. Dr. Henrene Pastor is planning a repeat ERCP and stone extraction within the next several weeks. This will need to be done under general anesthesia. Currently the patient is feeling relatively well. She has some occasional twinges of epigastric discomfort and some mild nausea but nothing like her presenting symptoms. She did not really have any significant abdominal pain leading up to this episode. No fever or chills.  Past Medical History  Diagnosis Date  . Hypertension   . Hyperlipidemia   . Morbid obesity   . Osteoarthritis   . Gout   . CVA (cerebral infarction)   . IBS (irritable bowel syndrome)   . Diverticulosis   . Esophageal stricture   . Colon polyps     hyperplastic  . Anxiety   . Hiatal hernia     4 cm  . DVT (deep vein thrombosis) in pregnancy   . Skin cancer    Past Surgical History  Procedure Date  . Abdominal hysterectomy   . Tonsillectomy   . Bile duct stent placement   . Pelvic bone tumor removal   . Colonic perforation repair   . Total hip arthroplasty     right  . Replacement total knee bilateral   . Joint replacement   . Colon surgery    Current Outpatient Prescriptions  Medication Sig Dispense Refill  . allopurinol  (ZYLOPRIM) 100 MG tablet Take 100 mg by mouth daily. Take 2 tablets every other day and 1 tablet every other day       . aspirin 325 MG tablet Take 325 mg by mouth daily.        . furosemide (LASIX) 40 MG tablet Take 40 mg by mouth daily.        Marland Kitchen lisinopril (PRINIVIL,ZESTRIL) 20 MG tablet Take 40 mg by mouth daily.        Marland Kitchen amLODipine (NORVASC) 5 MG tablet Take 5 mg by mouth daily.         Allergies  Allergen Reactions  . Atorvastatin     REACTION: myalgias  . Codeine   . Oxycodone-Acetaminophen     REACTION: confusion, fatigue  . Rosuvastatin     REACTION: leg weakness  . Simvastatin     REACTION: leg weakness  . Sulfonamide Derivatives    History  Substance Use Topics  . Smoking status: Never Smoker   . Smokeless tobacco: Never Used  . Alcohol Use: No    Review of Systems  Constitutional: Negative for fever and chills.  HENT: Negative.   Eyes: Negative.   Respiratory: Positive for shortness of breath and wheezing.   Cardiovascular: Positive for leg swelling.  Gastrointestinal: Positive for nausea and abdominal pain. Negative for vomiting.  Musculoskeletal: Positive for myalgias, back pain and arthralgias.  Objective:   Physical Exam General: Morbidly obese elderly white female in no acute distress Skin: No rash or infection HEENT: No scleral icterus. No palpable masses or thyromegaly. Lymph nodes: No palpable cervical or supraclavicular nodes Lungs: Moderate kyphosis. Clear without wheezing or increased work of breathing. Cardiovascular: Regular rate and rhythm without murmur. No JVD. 1+ lower extremity edema. Abdomen: Healed low midline incision. Obese. I cannot detect any incisional hernia. There is mild epigastric tenderness without guarding. No discernible mass or organomegaly. Extremities: Superficial varicosities and 1+ lower extremity edema. Well-healed incisions. Neurologic: Alert and fully oriented. Motor and sensory exam grossly normal.   Assessment:       76 year old female with several comorbidities who presented with common bile duct stones. She will require repeat ERCP to clear her bile duct. We discussed the indications for cholecystectomy i.e. that this is the source of the stones which could produce problems in the future or could lead to pain or infection from her gallbladder. I think she would be at somewhat increased risk from surgery but this does not appear to be prohibitive. We discussed alternatives of observation only or laparoscopic cholecystectomy. She would prefer surgical treatment I think this is reasonable. I will discuss with Dr. Henrene Pastor the possibility of doing this under the same anesthesia that this may not be feasible. I'll be back in touch with the patient regarding this and scheduling.    Plan:     Laparoscopic cholecystectomy. I will discuss with Dr. Henrene Pastor the timing of her surgery. Patient was given literature regarding the procedure and we discussed indications and risks of anesthetic complications, bleeding, infection, bile leak, common bile duct injury, and possible need for open procedure.

## 2011-01-24 NOTE — Discharge Summary (Signed)
  [name], [name]                ACCOUNT NO.:  [id number]  MEDICAL RECORD NO.:  [id number]  LOCATION:  [id number]                         FACILITY:  [facility]  PHYSICIAN:  Milus Banister, MD   DATE OF BIRTH:  1929/06/28  DATE OF ADMISSION:  12/11/2010 DATE OF DISCHARGE:  12/16/2010                              DISCHARGE SUMMARY   ADDENDUM  The patient's IV Unasyn was discontinued and p.o. Cipro was started on December 15, 2010.  The patient continued to improve and was tolerating solid food for greater than 48 hours prior to discharge.  She was in stable condition.  DISCHARGE FOLLOWUP:  With [facility] for a CMET on December 24, 2010, and she has an appointment to see Dr. Scarlette Shorts on January 06, 2011, at 10 a.m.  MEDICATIONS: 1. Ciprofloxacin 500 mg 1 tablet b.i.d. for 7 days. 2. Allopurinol 100 mg, she has an alternating dosage, she takes 1     tablet every other day alternating with 2 tablets opposite days. 3. Amlodipine 10 mg 1 p.o. daily at bedtime. 4. Artificial tears 1-2 drops to both eyes 3 times a day p.r.n. 5. Aspirin 325 mg 1 tablet p.o. daily. 6. Furosemide 40 mg 1 tablet p.o. daily. 7. Lisinopril 40 mg p.o. daily.  The patient did not receive prescription for any narcotic analgesics.  DISCHARGE DIET:  Diabetic.  ADDITIONAL DISCHARGE DIAGNOSIS:  Diminished level of consciousness, requiring Rapid Response Team and administration of Narcan for Dilaudid overdose.  The patient had no further problems with Dilaudid when the dose was decreased thereafter.     [name], PA-C   ______________________________ Milus Banister, MD   SG/MEDQ  D:  12/16/2010  T:  12/17/2010  Job:  [id number]  Electronically Signed by [name] PA-C on 12/31/2010 04:16:13 PM Electronically Signed by [name] MD on 01/24/2011 12:37:51 PM

## 2011-01-24 NOTE — Discharge Summary (Signed)
NAMEJANON, [name]                ACCOUNT NO.:  [id number]  MEDICAL RECORD NO.:  [id number]  LOCATION:  [id number]                         FACILITY:  [facility]  PHYSICIAN:  Milus Banister, MD   DATE OF BIRTH:  June 20, 1929  DATE OF ADMISSION:  12/11/2010 DATE OF DISCHARGE:  12/16/2010                              DISCHARGE SUMMARY   ADMITTING DIAGNOSES: 1. Epigastric pain with nausea, dilated biliary ducts and second     common bile duct.  Rule out choledocholithiasis, rule out     cholangitis. 2. History of the ampullary stenosis.  Status post endoscopic     retrograde cholangiopancreatography and biliary sphincterotomy in     2009. 3. History of colon polyps. 4. Postcolonoscopy colonic perforation, requiring laparotomy in the     1990s. 5. 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  She has undergone a laparotomy with enterolysis and     ventral hernia repair along with removal of recurrent myelolipoma     in 2002 by Dr. Hassell Done.  The patient had undergone previous     laparotomy with removal of presacral myelolipoma in 2001. 6. Right thalamic stroke in December 2003. 7. Dyslipidemia. 8. Hypertension. 9. Anxiety. 10.Gout. 11.Irritable bowel syndrome. 12.Diabetes mellitus type 2. 13.Diabetic peripheral neuropathy. 14.History of rheumatic fever. 15.Status post bilateral knee replacement. 16.Status post hip replacement. 17.History of deep venous thrombosis. 18.History of skin cancer. 19.Hiatal hernia. 20.Status post hysterectomy. 21.Suspected essential tremor. 22.Diagnosis of osteoarthritis.  The patient's baseline requires     leaning on objects in order to ambulate and/or use of a rolling     walker. 23.Question urinary tract infection.  DISCHARGE DIAGNOSES: 1. Choledocholithiasis and cholangitis. 2. Status post December 12, 2010 endoscopic retrograde     cholangiopancreatography.  Multiple stones were removed with     balloon and basket  sweeps.  Frank pus noted.  Draining from bile     duct.  A large 18-20 mm mobile common duct stone could not be     removed.  Thus, an 8.5-French 9-cm stent was placed.  Procedure     performed by Dr. Silvano Rusk. 3. Mild jaundice secondary to choledocholithiasis, cholangitis, slowly     improved LFT numbers during course of hospitalization. 4. Diabetes mellitus type 2.  Not out of control during this     admission.  BRIEF HISTORY:  [name] is a 77 year old white female who has had a number of medical issues over the years outlined in the listing above. She is known to Dr. Henrene Pastor because of a history of ampullary stenosis and the ERCP and sphincterotomy done in 2009.  She has also had complications at colonoscopy in the 1990s that required surgery for bowel perforation.  The patient presented to the emergency room at [facility] on November 24, 2010, complaining of epigastric and right upper quadrant pain with nausea.  She was unable to vomit.  Pain was worse after eating some cantaloupe.  In the ER, she got some temporary relief with 1 mg of IV Dilaudid.  She described the pain is radiating around to her flanks bilaterally.  This pain is more intense,  but not dissimilar in location and character from pain she has had prior to her ERCP.  CT scan demonstrated dilation of the intra and extrahepatic ducts and thickening of the distal common bile duct.  She did not have any alteration of her bowel habits associated with the above pain and she does not have chronic upper GI symptoms, but did state that she has been having a bit more regurgitation and belching within the last few days.  The patient's labs demonstrated she had mildly increased transaminases and LFTs.  On exam, she did not have any scleral icterus or obvious jaundice.  The patient was admitted by Dr. Carlean Purl to the hospital for further evaluation.  PROCEDURES:  ERCP with stent placement and clearance of debris and stones  from the bile duct.  See above description.  CONSULTATIONS:  None.  LABORATORY STUDIES:  White blood cell count maximum 19.7, it was 7.8 at discharge.  Hemoglobin 12.1, hematocrit 37.8.  MCV 92.  Platelets ranged anywhere from 214 down to 123.  CBG; capillary blood glucose ranged 112- 167.  Sodium 140, potassium 4.7, chloride 105, CO2 29.  Serum glucose ranged 90 up to 125.  Total bilirubin maximum 5.0, it was 1.1 on admission and 3.7 at discharge.  Alkaline phosphatase maximum 142 on December 16, 2010.  It was 91 on admission.  Maximum AST 181, it was 79 on admission and 80 at discharge.  ALT maximum 162 on December 13, 2010, it was 44 on admission and 110 at discharge.  Albumin 2.4.  Lipase level was 28.  RADIOLOGIC STUDIES:  Portable chest x-ray showed increasing linear atelectasis in the mid and lower lungs bilaterally and stable persistent cardiac enlargement.   CT scan of the abdomen and pelvis showed thickened distal common bile duct and dilated intra and extrahepatic biliary ducts, question as to whether this represented inflammatory stricture or possible tumor, recommended ERCP and/or close clinical followup.  No evidence for bowel perforation, degenerative changes incidentally noticed at L3-L4, incidental atherosclerotic changes of the aortic branch vessels.  HOSPITAL COURSE:  Dr. Carlean Purl started the patient on IV fluids, ice chips, and 1-2 mg of IV Dilaudid every 2-3 hours along with p.r.n. Zofran.  Her blood pressure medications of lisinopril and amlodipine along with allopurinol were continued.  He added IV Protonix.  The patient received a bolus of IV fluids in the emergency room.  She was also started on Unasyn by Dr. Carlean Purl.  Early in the morning about 4:55 on December 12, 2010, a Code Blue was called and the patient's rapid response team arrived because the patient had been unresponsive.  The patient required some bagging for oxygenation.  She received a dose of Narcan after  which she became increasingly responsive and had room air saturations of 98%.  She was transferred after this to Step-Down for closer monitoring.  The events were reviewed by Dr. Carlean Purl who felt that the patient had received too large a dose of Dilaudid and therefore, he decreased the dose of Dilaudid.   That same day, December 12, 2010, the patient underwent ERCP with placement of bile duct stent and sweeping of debris, stones, and pus from the bile duct.  However, he could not remove a large, retained stone so a stent was placed. Following this procedure for the next few days, her LFTs rose rather than declined initially.  However, the patient was feeling better, pain had resolved within a couple of days, she was eating a little bit better.  Nausea  resolved.  She continued antibiotic coverage with Unasyn through December 15, 2010, at which time, she was switched to Cipro.     [name], PA-C   ______________________________ Milus Banister, MD    SG/MEDQ  D:  12/16/2010  T:  12/17/2010  Job:  [id number]  cc:   Biagio Borg, MD Docia Chuck. Henrene Pastor, MD  Electronically Signed by [name] PA-C on 12/31/2010 04:13:55 PM Electronically Signed by [name] MD on 01/24/2011 12:37:49 PM

## 2011-01-25 ENCOUNTER — Telehealth: Payer: Self-pay | Admitting: Internal Medicine

## 2011-01-25 NOTE — Telephone Encounter (Signed)
Pt states that Dr. Excell Seltzer mentioned the possibility of removing her gallbladder when she is having her ERCP at the hospital. Called [facility] and spoke with Dr. Lear Ng nurse. She will have Dr. Excell Seltzer call Dr. Henrene Pastor tomorrow.

## 2011-01-26 NOTE — Telephone Encounter (Signed)
Dr. Henrene Pastor spoke with Dr. Excell Seltzer.

## 2011-02-01 ENCOUNTER — Ambulatory Visit (HOSPITAL_COMMUNITY)
Admission: RE | Admit: 2011-02-01 | Discharge: 2011-02-01 | Disposition: A | Discharge status: Home / Self Care | Payer: Medicare Other | Source: Ambulatory Visit | Attending: Internal Medicine | Admitting: Internal Medicine

## 2011-02-01 ENCOUNTER — Ambulatory Visit (HOSPITAL_COMMUNITY): Payer: Medicare Other

## 2011-02-01 ENCOUNTER — Telehealth: Payer: Self-pay | Admitting: *Deleted

## 2011-02-01 ENCOUNTER — Encounter: Payer: Medicare Other | Admitting: Internal Medicine

## 2011-02-01 DIAGNOSIS — K8050 Calculus of bile duct without cholangitis or cholecystitis without obstruction: Secondary | ICD-10-CM

## 2011-02-01 LAB — GLUCOSE, CAPILLARY: Glucose-Capillary: 110 mg/dL — ABNORMAL HIGH (ref 70–99)

## 2011-02-01 MED ORDER — CIPROFLOXACIN HCL 500 MG PO TABS: ORAL_TABLET | ORAL | Status: DC

## 2011-02-01 NOTE — Telephone Encounter (Signed)
Received a call from Tye Savoy, NP to call in rx for Cipro 500 mg BID x 3 days. Rx sent to patient's pharmacy

## 2011-02-07 ENCOUNTER — Inpatient Hospital Stay (HOSPITAL_COMMUNITY)
Admission: EM | Admit: 2011-02-07 | Discharge: 2011-02-09 | DRG: 395 | Disposition: A | Discharge status: Home Health | Payer: Medicare Other | Attending: Internal Medicine | Admitting: Internal Medicine

## 2011-02-07 DIAGNOSIS — E119 Type 2 diabetes mellitus without complications: Secondary | ICD-10-CM | POA: Diagnosis present

## 2011-02-07 DIAGNOSIS — Z8673 Personal history of transient ischemic attack (TIA), and cerebral infarction without residual deficits: Secondary | ICD-10-CM

## 2011-02-07 DIAGNOSIS — K648 Other hemorrhoids: Principal | ICD-10-CM | POA: Diagnosis present

## 2011-02-07 DIAGNOSIS — Z860100 Personal history of colon polyps, unspecified: Secondary | ICD-10-CM

## 2011-02-07 DIAGNOSIS — E785 Hyperlipidemia, unspecified: Secondary | ICD-10-CM | POA: Diagnosis present

## 2011-02-07 DIAGNOSIS — K644 Residual hemorrhoidal skin tags: Secondary | ICD-10-CM | POA: Diagnosis present

## 2011-02-07 DIAGNOSIS — K5730 Diverticulosis of large intestine without perforation or abscess without bleeding: Secondary | ICD-10-CM | POA: Diagnosis present

## 2011-02-07 LAB — BASIC METABOLIC PANEL
BUN: 32 mg/dL — ABNORMAL HIGH (ref 6–23)
CO2: 31 mEq/L (ref 19–32)
Calcium: 9.3 mg/dL (ref 8.4–10.5)
Chloride: 104 mEq/L (ref 96–112)
Creatinine, Ser: 1.02 mg/dL (ref 0.50–1.10)
GFR calc Af Amer: >60 mL/min (ref >60)
GFR calc non Af Amer: 52 mL/min — ABNORMAL LOW (ref >60)
Glucose, Bld: 173 mg/dL — ABNORMAL HIGH (ref 70–99)
Potassium: 4.1 mEq/L (ref 3.5–5.1)
Sodium: 141 mEq/L (ref 135–145)

## 2011-02-07 LAB — CBC
HCT: 43.4 % (ref 36.0–46.0)
Hemoglobin: 14 g/dL (ref 12.0–15.0)
MCH: 29.2 pg (ref 26.0–34.0)
MCHC: 32.3 g/dL (ref 30.0–36.0)
MCV: 90.6 fL (ref 78.0–100.0)
Platelets: 200 10*3/uL (ref 150–400)
RBC: 4.79 MIL/uL (ref 3.87–5.11)
RDW: 15.7 % — ABNORMAL HIGH (ref 11.5–15.5)
WBC: 9.2 10*3/uL (ref 4.0–10.5)

## 2011-02-07 LAB — DIFFERENTIAL
Basophils Absolute: 0 10*3/uL (ref 0.0–0.1)
Basophils Relative: 0 % (ref 0–1)
Eosinophils Absolute: 0.3 10*3/uL (ref 0.0–0.7)
Eosinophils Relative: 4 % (ref 0–5)
Lymphocytes Relative: 16 % (ref 12–46)
Lymphs Abs: 1.4 10*3/uL (ref 0.7–4.0)
Monocytes Absolute: 0.8 10*3/uL (ref 0.1–1.0)
Monocytes Relative: 9 % (ref 3–12)
Neutro Abs: 6.6 10*3/uL (ref 1.7–7.7)
Neutrophils Relative %: 72 % (ref 43–77)

## 2011-02-08 ENCOUNTER — Encounter: Payer: Self-pay | Admitting: Internal Medicine

## 2011-02-08 DIAGNOSIS — K922 Gastrointestinal hemorrhage, unspecified: Secondary | ICD-10-CM

## 2011-02-08 LAB — CBC
HCT: 41.1 % (ref 36.0–46.0)
HCT: 43.9 % (ref 36.0–46.0)
Hemoglobin: 13.3 g/dL (ref 12.0–15.0)
Hemoglobin: 14.3 g/dL (ref 12.0–15.0)
MCH: 29.2 pg (ref 26.0–34.0)
MCH: 29.4 pg (ref 26.0–34.0)
MCHC: 32.4 g/dL (ref 30.0–36.0)
MCHC: 32.6 g/dL (ref 30.0–36.0)
MCV: 90.1 fL (ref 78.0–100.0)
MCV: 90.3 fL (ref 78.0–100.0)
Platelets: 189 10*3/uL (ref 150–400)
Platelets: 205 10*3/uL (ref 150–400)
RBC: 4.56 MIL/uL (ref 3.87–5.11)
RBC: 4.86 MIL/uL (ref 3.87–5.11)
RDW: 15.6 % — ABNORMAL HIGH (ref 11.5–15.5)
RDW: 15.5 % (ref 11.5–15.5)
WBC: 8.6 10*3/uL (ref 4.0–10.5)
WBC: 7.9 10*3/uL (ref 4.0–10.5)

## 2011-02-08 LAB — GLUCOSE, CAPILLARY
Glucose-Capillary: 186 mg/dL — ABNORMAL HIGH (ref 70–99)
Glucose-Capillary: 113 mg/dL — ABNORMAL HIGH (ref 70–99)
Glucose-Capillary: 100 mg/dL — ABNORMAL HIGH (ref 70–99)

## 2011-02-08 LAB — CARDIAC PANEL(CRET KIN+CKTOT+MB+TROPI)
CK, MB: 2.1 ng/mL (ref 0.3–4.0)
CK, MB: 2.2 ng/mL (ref 0.3–4.0)
CK, MB: 2.2 ng/mL (ref 0.3–4.0)
Relative Index: INVALID (ref 0.0–2.5)
Relative Index: INVALID (ref 0.0–2.5)
Relative Index: INVALID (ref 0.0–2.5)
Total CK: 29 U/L (ref 7–177)
Total CK: 36 U/L (ref 7–177)
Total CK: 35 U/L (ref 7–177)
Troponin I: <0.3 ng/mL (ref <0.30)
Troponin I: <0.3 ng/mL (ref <0.30)
Troponin I: <0.3 ng/mL (ref <0.30)

## 2011-02-08 LAB — OCCULT BLOOD, POC DEVICE: Fecal Occult Bld: POSITIVE

## 2011-02-08 LAB — TSH: TSH: 3.965 u[IU]/mL (ref 0.350–4.500)

## 2011-02-09 LAB — CBC
HCT: 42.7 % (ref 36.0–46.0)
Hemoglobin: 13.4 g/dL (ref 12.0–15.0)
MCH: 28.5 pg (ref 26.0–34.0)
MCHC: 31.4 g/dL (ref 30.0–36.0)
MCV: 90.7 fL (ref 78.0–100.0)
Platelets: 193 10*3/uL (ref 150–400)
RBC: 4.71 MIL/uL (ref 3.87–5.11)
RDW: 15.6 % — ABNORMAL HIGH (ref 11.5–15.5)
WBC: 7.5 10*3/uL (ref 4.0–10.5)

## 2011-02-09 LAB — GLUCOSE, CAPILLARY
Glucose-Capillary: 112 mg/dL — ABNORMAL HIGH (ref 70–99)
Glucose-Capillary: 104 mg/dL — ABNORMAL HIGH (ref 70–99)

## 2011-02-10 LAB — TYPE AND SCREEN
ABO/RH(D): O NEG
Antibody Screen: POSITIVE
DAT, IgG: NEGATIVE
Unit division: 0
Unit division: 0

## 2011-02-11 ENCOUNTER — Telehealth: Payer: Self-pay | Admitting: Internal Medicine

## 2011-02-11 NOTE — Telephone Encounter (Signed)
Pt states she had some rectal bleeding. Pt was in the hospital and had a colon 02/09/11 and there were internal and external hemorrhoids present. Pt was instructed to use OTC cream such as preparation H. Pt states she did this right after she went to the bathroom. Pt instructed to use it regularly, do sitz baths and to call us if no improvement.

## 2011-02-11 NOTE — Op Note (Signed)
[name], [name]                ACCOUNT NO.:  [id number]  MEDICAL RECORD NO.:  [id number]  LOCATION:  [facility]                         FACILITY:  [facility]  PHYSICIAN:  Marie Chuck. Henrene Pastor, MD      DATE OF BIRTH:  [birth year]  DATE OF PROCEDURE:  02/01/2011 DATE OF DISCHARGE:                              OPERATIVE REPORT   PROCEDURE:  Endoscopic retrograde cholangiography with biliary stent removal followed by mechanical lithotripsy of common bile duct stone followed by a combination of basket and balloon stone extraction techniques as well extension of the biliary sphincterotomy.  INDICATION:  Retained common bile duct stones.  HISTORY:  This is a 77 year old female with multiple significant medical problems as documented who underwent ERCP, December 12, 2010 for symptomatic choledocholithiasis.  She was found to have multiple common duct stones, which were removed as well a large 2 cm stone, which was retained.  A biliary stent placed for drainage.  The patient was stable when seen in followup and has been scheduled electively for this procedure.  The nature of the procedure as well as the risks, benefits, and alternatives were carefully reviewed with the patient.  She understood and agreed to proceed.  PHYSICAL EXAMINATION:  GENERAL:  A well-appearing female in no acute distress.  She is alert and oriented. VITAL SIGNS:  Stable. LUNGS:  Clear. HEART:  Regular. ABDOMEN:  Obese, but soft without pain.  DESCRIPTION OF PROCEDURE:  After informed consent was obtained, the patient was sedated via general anesthesia.  See that report for details.  Preoperative antibiotics were provided in the form of ciprofloxacin.  The Pentax side-viewing endoscope was then passed blindly into the stomach.  The stomach was unremarkable.  The duodenal bulb was unremarkable.  The postbulbar duodenum revealed an indwelling biliary stent.  A scout radiograph of the abdomen with the endoscope and biliary stent in  position was obtained.  Subsequently, the stent was removed.  Thereafter, injection of contrast via a previously made moderate biliary sphincterotomy yielded small filling defects.  The most significant finding, however was that of a 2.5 to 3 cm common hepatic ductal stone.  This was mobile.  Initial attempts to significantly fragment the stone with a mechanical lithotriptor were unsuccessful. Multiple stone fragments were removed with a balloon.  The biliary sphincterotomy was then extended with cutting via the ERBE system. Subsequently, the stone was engaged with a mechanical lithotriptor and broken up into multiple fragments.  These fragments were extracted with the basket.  Subsequently, smaller residual fragments were extracted with a sequential balloon.  Examination was quite cumbersome, but no complications encountered.  The exam took approximately 2 hours.  The final post extraction occlusion cholangiogram demonstrated no residual filling defects with excellent drainage.  IMPRESSION:  Choledocholithiasis and giant choledocholith, status post ERC with previous biliary stent removal, biliary sphincterotomy extension, mechanical lithotripsy of large stone, and a combination of basket and balloon techniques to clear the bile duct.  RECOMMENDATIONS: 1. Observe postprocedure. 2. Ciprofloxacin 500 mg p.o. b.i.d. for 3 days. 3. Discuss with General Surgery, Dr. Excell Seltzer, timing of laparoscopic     cholecystectomy.     Marie Chuck. Henrene Pastor, MD  JNP/MEDQ  D:  02/01/2011  T:  02/01/2011  Job:  [id number]  cc:   [name], M.D. [address], [address] [place] [address]  Marie Borg, MD [address] [place] [address]  Electronically Signed by Marie Shorts MD on 02/11/2011 11:04:58 AM

## 2011-02-13 ENCOUNTER — Encounter: Payer: Self-pay | Admitting: Internal Medicine

## 2011-02-13 DIAGNOSIS — Z0000 Encounter for general adult medical examination without abnormal findings: Secondary | ICD-10-CM | POA: Insufficient documentation

## 2011-02-15 ENCOUNTER — Encounter: Payer: Self-pay | Admitting: Internal Medicine

## 2011-02-15 ENCOUNTER — Ambulatory Visit (INDEPENDENT_AMBULATORY_CARE_PROVIDER_SITE_OTHER): Payer: Medicare Other | Admitting: Internal Medicine

## 2011-02-15 VITALS — BP 128/60 | HR 73 | Temp 97.9°F | Ht 66.0 in | Wt 248.0 lb

## 2011-02-15 DIAGNOSIS — F411 Generalized anxiety disorder: Secondary | ICD-10-CM

## 2011-02-15 DIAGNOSIS — I10 Essential (primary) hypertension: Secondary | ICD-10-CM

## 2011-02-15 DIAGNOSIS — E119 Type 2 diabetes mellitus without complications: Secondary | ICD-10-CM

## 2011-02-15 DIAGNOSIS — E785 Hyperlipidemia, unspecified: Secondary | ICD-10-CM

## 2011-02-15 NOTE — Assessment & Plan Note (Signed)
stable overall by hx and exam, most recent data reviewed with pt, and pt to continue medical treatment as before  BP Readings from Last 3 Encounters:  02/15/11 128/60  01/21/11 146/103  01/06/11 170/72

## 2011-02-15 NOTE — Patient Instructions (Signed)
Continue all other medications as before Please return in 6 months, or sooner if needed 

## 2011-02-15 NOTE — Assessment & Plan Note (Signed)
Goal ldl < 70, but has been intol to mult statin; declines further trial other med today, to focus on lower chol diet  Last ldl 175  (mar 2012)

## 2011-02-15 NOTE — Assessment & Plan Note (Signed)
stable overall by hx and exam, most recent data reviewed with pt, and pt to continue medical treatment as before  Lab Results  Component Value Date   WBC 7.5 02/09/2011   HGB 13.4 02/09/2011   HCT 42.7 02/09/2011   PLT 193 02/09/2011   CHOL 235* 08/17/2010   TRIG 182.0* 08/17/2010   HDL 39.40 08/17/2010   LDLDIRECT 175.2 08/17/2010   ALT 35 01/06/2011   AST 41* 01/06/2011   NA 141 02/07/2011   K 4.1 02/07/2011   CL 104 02/07/2011   CREATININE 1.02 02/07/2011   BUN 32* 02/07/2011   CO2 31 02/07/2011   TSH 3.965 02/08/2011   INR 1.75* 07/18/2009   HGBA1C 6.8* 08/17/2010   MICROALBUR 2.8* 08/17/2010

## 2011-02-15 NOTE — Progress Notes (Signed)
Subjective:    Patient ID: [name], female    DOB: March 03, 1930, 77 y.o.   MRN: [id number]  HPI  Here after recent hospn with Bile duct stent for stone later removed; will need GB surgury soon.  Here to f/u; overall doing ok,  Pt denies chest pain, increased sob or doe, wheezing, orthopnea, PND, increased LE swelling, palpitations, dizziness or syncope.  Pt denies new neurological symptoms such as new headache, or facial or extremity weakness or numbness   Pt denies polydipsia, polyuria, or low sugar symptoms such as weakness or confusion improved with po intake.  Pt states overall good compliance with meds, trying to follow lower cholesterol, diabetic diet, wt overall stable but little exercise however.  Unfortunately could not tolerate the pravastatin from last visit and does not want to try further statin  No other new complaints. Wants to avoid labs today as she was just hospd.  Denies worsening depressive symptoms, suicidal ideation, or panic, anxiety overall stable per pt Past Medical History  Diagnosis Date  . Hypertension   . Hyperlipidemia   . Morbid obesity   . Osteoarthritis   . Gout   . CVA (cerebral infarction)   . IBS (irritable bowel syndrome)   . Diverticulosis   . Esophageal stricture   . Colon polyps     hyperplastic  . Anxiety   . Hiatal hernia     4 cm  . DVT (deep vein thrombosis) in pregnancy   . Skin cancer    Past Surgical History  Procedure Date  . Abdominal hysterectomy   . Tonsillectomy   . Bile duct stent placement   . Pelvic bone tumor removal   . Colonic perforation repair   . Total hip arthroplasty     right  . Replacement total knee bilateral   . Joint replacement   . Colon surgery     reports that she has never smoked. She has never used smokeless tobacco. She reports that she does not drink alcohol or use illicit drugs. family history includes Breast cancer in her sister; Kidney failure in her sister; Prostate cancer in her brother; and  Stroke in her mother.  There is no history of Colon cancer. Allergies  Allergen Reactions  . Atorvastatin     REACTION: myalgias  . Codeine   . Oxycodone-Acetaminophen     REACTION: confusion, fatigue  . Rosuvastatin     REACTION: leg weakness  . Simvastatin     REACTION: leg weakness  . Sulfonamide Derivatives    Current Outpatient Prescriptions on File Prior to Visit  Medication Sig Dispense Refill  . allopurinol (ZYLOPRIM) 100 MG tablet Take 100 mg by mouth daily. Take 2 tablets every other day and 1 tablet every other day       . amLODipine (NORVASC) 5 MG tablet Take 5 mg by mouth daily.        Marland Kitchen aspirin 325 MG tablet Take 325 mg by mouth daily.        . furosemide (LASIX) 40 MG tablet Take 40 mg by mouth daily.        Marland Kitchen lisinopril (PRINIVIL,ZESTRIL) 20 MG tablet Take 40 mg by mouth daily.         Review of Systems Review of Systems  Constitutional: Negative for diaphoresis and unexpected weight change.  HENT: Negative for drooling and tinnitus.   Eyes: Negative for photophobia and visual disturbance.  Respiratory: Negative for choking and stridor.   Gastrointestinal: Negative for vomiting and  blood in stool.  Genitourinary: Negative for hematuria and decreased urine volume.    Objective:   Physical Exam BP 128/60  Pulse 73  Temp(Src) 97.9 F (36.6 C) (Oral)  Ht 5\' 6"  (1.676 m)  Wt 248 lb (112.492 kg)  BMI 40.03 kg/m2  SpO2 97% Physical Exam  VS noted Constitutional: Pt appears well-developed and well-nourished.  HENT: Head: Normocephalic.  Right Ear: External ear normal.  Left Ear: External ear normal.  Eyes: Conjunctivae and EOM are normal. Pupils are equal, round, and reactive to light.  Neck: Normal range of motion. Neck supple.  Cardiovascular: Normal rate and regular rhythm.   Pulmonary/Chest: Effort normal and breath sounds normal.  Abd:  Soft, NT, non-distended, + BS Neurological: Pt is alert. No cranial nerve deficit.  Skin: Skin is warm. No erythema.    Psychiatric: Pt behavior is normal. Thought content normal. mild nervous , o/w smiles, not depressed affect        Assessment & Plan:

## 2011-02-15 NOTE — Assessment & Plan Note (Signed)
Controlled by hx, pt declines a1c today, glc seem reasonable per echart reeview, Continue all other medications as before

## 2011-02-21 ENCOUNTER — Ambulatory Visit (INDEPENDENT_AMBULATORY_CARE_PROVIDER_SITE_OTHER): Payer: Medicare Other | Admitting: Internal Medicine

## 2011-02-21 ENCOUNTER — Encounter: Payer: Self-pay | Admitting: Internal Medicine

## 2011-02-21 DIAGNOSIS — F411 Generalized anxiety disorder: Secondary | ICD-10-CM

## 2011-02-21 DIAGNOSIS — B029 Zoster without complications: Secondary | ICD-10-CM | POA: Insufficient documentation

## 2011-02-21 DIAGNOSIS — I10 Essential (primary) hypertension: Secondary | ICD-10-CM

## 2011-02-21 HISTORY — DX: Zoster without complications: B02.9

## 2011-02-21 MED ORDER — VALACYCLOVIR HCL 1 G PO TABS: ORAL_TABLET | ORAL | Status: DC

## 2011-02-21 NOTE — Assessment & Plan Note (Signed)
stable overall by hx and exam, most recent data reviewed with pt, and pt to continue medical treatment as before  BP Readings from Last 3 Encounters:  02/21/11 120/70  02/15/11 128/60  01/21/11 146/103

## 2011-02-21 NOTE — Progress Notes (Signed)
Subjective:    Patient ID: [name], female    DOB: 12/12/1929, 77 y.o.   MRN: [id number]  HPI Here with 3 day onset painful rash to right upper back, no prior hx but she knows a friend with shingles and thinks it might be this.  Did stay away from small child over the weekend.  Pain approx 8/10, but better with pain medication (narcotic) left over from previous right knee surgury, but cant recall the name. Still have about half bottle left.  Pt denies chest pain, increased sob or doe, wheezing, orthopnea, PND, increased LE swelling, palpitations, dizziness or syncope.  Pt denies new neurological symptoms such as new headache, or facial or extremity weakness or numbness   Pt denies polydipsia, polyuria.  Pt denies fever, wt loss, night sweats, loss of appetite, or other constitutional symptoms.  Denies worsening depressive symptoms, suicidal ideation, or panic, though has ongoing anxiety. Past Medical History  Diagnosis Date  . Hypertension   . Hyperlipidemia   . Morbid obesity   . Osteoarthritis   . Gout   . CVA (cerebral infarction)   . IBS (irritable bowel syndrome)   . Diverticulosis   . Esophageal stricture   . Colon polyps     hyperplastic  . Anxiety   . Hiatal hernia     4 cm  . DVT (deep vein thrombosis) in pregnancy   . Skin cancer    Past Surgical History  Procedure Date  . Abdominal hysterectomy   . Tonsillectomy   . Bile duct stent placement   . Pelvic bone tumor removal   . Colonic perforation repair   . Total hip arthroplasty     right  . Replacement total knee bilateral   . Joint replacement   . Colon surgery     reports that she has never smoked. She has never used smokeless tobacco. She reports that she does not drink alcohol or use illicit drugs. family history includes Breast cancer in her sister; Kidney failure in her sister; Prostate cancer in her brother; and Stroke in her mother.  There is no history of Colon cancer. Allergies  Allergen Reactions    . Atorvastatin     REACTION: myalgias  . Codeine   . Oxycodone-Acetaminophen     REACTION: confusion, fatigue  . Rosuvastatin     REACTION: leg weakness  . Simvastatin     REACTION: leg weakness  . Sulfonamide Derivatives    Current Outpatient Prescriptions on File Prior to Visit  Medication Sig Dispense Refill  . allopurinol (ZYLOPRIM) 100 MG tablet Take 100 mg by mouth daily. Take 2 tablets every other day and 1 tablet every other day       . amLODipine (NORVASC) 5 MG tablet Take 5 mg by mouth daily.        Marland Kitchen aspirin 325 MG tablet Take 325 mg by mouth daily.        . furosemide (LASIX) 40 MG tablet Take 40 mg by mouth daily.        Marland Kitchen lisinopril (PRINIVIL,ZESTRIL) 20 MG tablet Take 40 mg by mouth daily.         Review of Systems Review of Systems  Constitutional: Negative for diaphoresis and unexpected weight change.  HENT: Negative for drooling and tinnitus.   Eyes: Negative for photophobia and visual disturbance.  Respiratory: Negative for choking and stridor.   Gastrointestinal: Negative for vomiting and blood in stool.  Genitourinary: Negative for hematuria and decreased urine volume.  Objective:   Physical Exam BP 120/70  Pulse 75  Temp(Src) 98.2 F (36.8 C) (Oral)  Ht 5\' 6"  (1.676 m)  Wt 250 lb 2 oz (113.456 kg)  BMI 40.37 kg/m2  SpO2 97% Physical Exam  VS noted, not ill appearing Constitutional: Pt appears well-developed and well-nourished.  HENT: Head: Normocephalic.  Right Ear: External ear normal.  Left Ear: External ear normal.  Eyes: Conjunctivae and EOM are normal. Pupils are equal, round, and reactive to light.  Neck: Normal range of motion. Neck supple.  Cardiovascular: Normal rate and regular rhythm.   Pulmonary/Chest: Effort normal and breath sounds normal.  Abd:  Soft, NT, non-distended, + BS Neurological: Pt is alert. No cranial nerve deficit.  Skin: Skin is warm. No erythema. except for extensive typical gruped vesicles on erythema base to  right upper back and right axilla as well Psychiatric: Pt behavior is normal. Thought content normal. 1+ nervous        Assessment & Plan:

## 2011-02-21 NOTE — Patient Instructions (Addendum)
Take all new medications as prescribed - the antibiotic (valtrex generic) Continue all other medications as before Please call with the name of the pain medication if you need further medication

## 2011-02-21 NOTE — Assessment & Plan Note (Signed)
stable overall by hx and exam, most recent data reviewed with pt, and pt to continue medical treatment as before  Lab Results  Component Value Date   WBC 7.5 02/09/2011   HGB 13.4 02/09/2011   HCT 42.7 02/09/2011   PLT 193 02/09/2011   CHOL 235* 08/17/2010   TRIG 182.0* 08/17/2010   HDL 39.40 08/17/2010   LDLDIRECT 175.2 08/17/2010   ALT 35 01/06/2011   AST 41* 01/06/2011   NA 141 02/07/2011   K 4.1 02/07/2011   CL 104 02/07/2011   CREATININE 1.02 02/07/2011   BUN 32* 02/07/2011   CO2 31 02/07/2011   TSH 3.965 02/08/2011   INR 1.75* 07/18/2009   HGBA1C 6.8* 08/17/2010   MICROALBUR 2.8* 08/17/2010

## 2011-02-21 NOTE — Assessment & Plan Note (Signed)
Moderate to severe pain with typical rash, I suspect right c7 dermatome approx;  For valtrex, d/w pt natural course of disease,  Cont pain med she has at home, but could call us if she runs out (I suspect something like vidocin)

## 2011-03-01 ENCOUNTER — Encounter: Payer: Self-pay | Admitting: Internal Medicine

## 2011-03-01 ENCOUNTER — Ambulatory Visit (INDEPENDENT_AMBULATORY_CARE_PROVIDER_SITE_OTHER): Payer: Medicare Other | Admitting: Internal Medicine

## 2011-03-01 VITALS — BP 118/74 | HR 88 | Ht 66.0 in | Wt 250.6 lb

## 2011-03-01 DIAGNOSIS — K8020 Calculus of gallbladder without cholecystitis without obstruction: Secondary | ICD-10-CM

## 2011-03-01 DIAGNOSIS — K648 Other hemorrhoids: Secondary | ICD-10-CM

## 2011-03-01 DIAGNOSIS — K5790 Diverticulosis of intestine, part unspecified, without perforation or abscess without bleeding: Secondary | ICD-10-CM

## 2011-03-01 DIAGNOSIS — K625 Hemorrhage of anus and rectum: Secondary | ICD-10-CM

## 2011-03-01 DIAGNOSIS — K5730 Diverticulosis of large intestine without perforation or abscess without bleeding: Secondary | ICD-10-CM

## 2011-03-01 DIAGNOSIS — K8050 Calculus of bile duct without cholangitis or cholecystitis without obstruction: Secondary | ICD-10-CM

## 2011-03-01 NOTE — Progress Notes (Signed)
HISTORY OF PRESENT ILLNESS:  [name] is a 77 y.o. female with multiple medical problems who presents today for followup. She was last seen on 02/01/2011 as an outpatient when she underwent ERCP to deal with a retained common bile duct stone. Previously placed biliary stent was removed, previous biliary sphincterotomy extended, and giant common ducts removed with a combination of mechanical lithotriptor, basket, and balloon. No post procedure related problems. Her gallbladder is intact. However, she was hospitalized August 27 through August 29 for minor rectal bleeding. She underwent complete colonoscopy with Dr. Ardis Hughs who found diverticulosis and hemorrhoids. Hemorrhoids is felt to be the cause of bleeding. No bleeding since hospital discharge. She has also seen Dr. Excell Seltzer who plans a laparoscopic cholecystectomy with intraoperative cholangiogram on October 10.   REVIEW OF SYSTEMS:  All non-GI ROS negative except for arthritis  Past Medical History  Diagnosis Date  . Hypertension   . Hyperlipidemia   . Morbid obesity   . Osteoarthritis   . Gout   . CVA (cerebral infarction)   . IBS (irritable bowel syndrome)   . Diverticulosis   . Esophageal stricture   . Colon polyps     hyperplastic  . Anxiety   . Hiatal hernia     4 cm  . DVT (deep vein thrombosis) in pregnancy   . Skin cancer   . Shingles 02/21/2011    Past Surgical History  Procedure Date  . Abdominal hysterectomy   . Tonsillectomy   . Bile duct stent placement   . Pelvic bone tumor removal   . Colonic perforation repair   . Total hip arthroplasty     right  . Replacement total knee bilateral   . Joint replacement   . Colon surgery     Social History [name]  reports that she has never smoked. She has never used smokeless tobacco. She reports that she does not drink alcohol or use illicit drugs.  family history includes Breast cancer in her sister; Kidney failure in her sister; Prostate  cancer in her brother; and Stroke in her mother.  There is no history of Colon cancer.  Allergies  Allergen Reactions  . Atorvastatin     REACTION: myalgias  . Codeine   . Oxycodone-Acetaminophen     REACTION: confusion, fatigue  . Rosuvastatin     REACTION: leg weakness  . Simvastatin     REACTION: leg weakness  . Sulfonamide Derivatives        PHYSICAL EXAMINATION: Vital signs: BP 118/74  Pulse 88  Ht 5\' 6"  (1.676 m)  Wt 250 lb 9.6 oz (113.671 kg)  BMI 40.45 kg/m2 General: Well-developed, obese, well-nourished, no acute distress HEENT: Sclerae are anicteric, conjunctiva pink. Oral mucosa intact Lungs: Clear Heart: Regular Abdomen: soft, obese, nontender, nondistended, no obvious ascites, no peritoneal signs, normal bowel sounds. No organomegaly. Extremities: No edema Psychiatric: alert and oriented x3. Cooperative     ASSESSMENT:  #1. Choledocholithiasis status post ERCP with sphincterotomy and common duct clearance #2. Cholelithiasis #3. Recent problems with rectal bleeding due to hemorrhoids #4. Incidental diverticulosis    PLAN:  #1. Keep plans for laparoscopic cholecystectomy #2. GI followup when necessary

## 2011-03-01 NOTE — Patient Instructions (Signed)
Follow-up with Dr. Perry as needed.  

## 2011-03-07 NOTE — Discharge Summary (Signed)
  [name], PICONE                ACCOUNT NO.:  [id number]  MEDICAL RECORD NO.:  [id number]  LOCATION:  [id number]                         FACILITY:  [facility]  PHYSICIAN:  Ardyth Gal, MD    DATE OF BIRTH:  1929/07/20  DATE OF ADMISSION:  02/07/2011 DATE OF DISCHARGE:  02/09/2011                              DISCHARGE SUMMARY   ADMITTING DIAGNOSIS:  Bright-red blood per rectum.  DISCHARGE DIAGNOSES: 1. Moderate diverticulosis throughout the colon. 2. Internal and external hemorrhoids, most likely the source of recent     minor rectal bleeding.  DISCHARGE MEDICATIONS: 1. Lisinopril 40 mg p.o. daily. 2. Furosemide 40 mg p.o. daily. 3. Aspirin 325 mg p.o. daily. 4. Amlodipine 10 mg p.o. daily. 5. Allopurinol 100 mg 2 tablets p.o. daily.  REASON FOR HOSPITALIZATION:  [name] is a 77 year old female who was admitted to our hospital on February 07, 2011, via  ED due to limited rectal bleeding.  For details, see dictated H&P.  HOSPITAL COURSE:  The patient was admitted to the hospital and was evaluated.  She was found with a hemoglobin of 14, hematocrit 43.4, and platelet count 200.  Her Hemoccult was positive.  Her vital signs were stable.  She was placed n.p.o., fluids given, and prepared for colonoscopy today.  The patient underwent colonoscopy, which demonstrated the presence of internal and external hemorrhoids as the most likely cause of her bleeding.  Diverticulosis was noted throughout the colon.  The patient tolerated the procedure well and was deemed to be stable to go back home.  CONDITION ON DISCHARGE:  Stable.  PROCEDURES:  Colonoscopy.  CONSULTANTS:  GI, Dr. Ardis Hughs.  FOLLOWUP INSTRUCTIONS:  Resume heart-healthy diet.  Follow up with primary care physician in 2 weeks and repeat CBC.          ______________________________ Ardyth Gal, MD     GL/MEDQ  D:  02/09/2011  T:  02/09/2011  Job:  [id number]  Electronically Signed by Ardyth Gal MD on  03/07/2011 04:04:51 PM

## 2011-03-14 ENCOUNTER — Other Ambulatory Visit (HOSPITAL_COMMUNITY): Payer: Medicare Other

## 2011-03-14 LAB — URINALYSIS, ROUTINE W REFLEX MICROSCOPIC
Bilirubin Urine: NEGATIVE
Glucose, UA: NEGATIVE
Hgb urine dipstick: NEGATIVE
Ketones, ur: NEGATIVE
Nitrite: NEGATIVE
Protein, ur: NEGATIVE
Specific Gravity, Urine: 1.024
Urobilinogen, UA: 0.2
pH: 5.5

## 2011-03-14 LAB — CBC
HCT: 35.7 — ABNORMAL LOW
HCT: 35.8 — ABNORMAL LOW
HCT: 38.5
HCT: 44.4
Hemoglobin: 11.8 — ABNORMAL LOW
Hemoglobin: 11.9 — ABNORMAL LOW
Hemoglobin: 12.6
Hemoglobin: 14.5
MCHC: 32.6
MCHC: 32.8
MCHC: 33.1
MCHC: 33.3
MCV: 90.6
MCV: 90.9
MCV: 91.1
MCV: 91.4
Platelets: 156
Platelets: 164
Platelets: 167
Platelets: 181
RBC: 3.93
RBC: 3.95
RBC: 4.23
RBC: 4.86
RDW: 15.7 — ABNORMAL HIGH
RDW: 15.8 — ABNORMAL HIGH
RDW: 15.9 — ABNORMAL HIGH
RDW: 16 — ABNORMAL HIGH
WBC: 6.9
WBC: 8.4
WBC: 9.3
WBC: 9.7

## 2011-03-14 LAB — PROTIME-INR
INR: 0.9
INR: 1
INR: 1.2
INR: 1.5
INR: 1.7 — ABNORMAL HIGH
Prothrombin Time: 12.7
Prothrombin Time: 13.5
Prothrombin Time: 15.8 — ABNORMAL HIGH
Prothrombin Time: 19 — ABNORMAL HIGH
Prothrombin Time: 20.9 — ABNORMAL HIGH

## 2011-03-14 LAB — TYPE AND SCREEN
ABO/RH(D): O NEG
Antibody Screen: POSITIVE
DAT, IgG: NEGATIVE

## 2011-03-14 LAB — APTT: aPTT: 23 — ABNORMAL LOW

## 2011-03-14 LAB — COMPREHENSIVE METABOLIC PANEL
ALT: 17
AST: 20
Albumin: 3.5
Alkaline Phosphatase: 79
BUN: 29 — ABNORMAL HIGH
CO2: 29
Calcium: 9.7
Chloride: 108
Creatinine, Ser: 1.11
GFR calc Af Amer: 58 — ABNORMAL LOW
GFR calc non Af Amer: 48 — ABNORMAL LOW
Glucose, Bld: 96
Potassium: 4.6
Sodium: 145
Total Bilirubin: 1
Total Protein: 6.3

## 2011-03-14 LAB — BASIC METABOLIC PANEL
BUN: 25 — ABNORMAL HIGH
BUN: 31 — ABNORMAL HIGH
CO2: 31
CO2: 32
Calcium: 8.5
Calcium: 8.7
Chloride: 101
Chloride: 102
Creatinine, Ser: 1.1
Creatinine, Ser: 1.26 — ABNORMAL HIGH
GFR calc Af Amer: 50 — ABNORMAL LOW
GFR calc Af Amer: 58 — ABNORMAL LOW
GFR calc non Af Amer: 41 — ABNORMAL LOW
GFR calc non Af Amer: 48 — ABNORMAL LOW
Glucose, Bld: 115 — ABNORMAL HIGH
Glucose, Bld: 190 — ABNORMAL HIGH
Potassium: 4.3
Potassium: 4.8
Sodium: 138
Sodium: 139

## 2011-03-14 LAB — GLUCOSE, CAPILLARY
Glucose-Capillary: 117 — ABNORMAL HIGH
Glucose-Capillary: 162 — ABNORMAL HIGH

## 2011-03-17 ENCOUNTER — Other Ambulatory Visit (INDEPENDENT_AMBULATORY_CARE_PROVIDER_SITE_OTHER): Payer: Self-pay | Admitting: General Surgery

## 2011-03-17 ENCOUNTER — Encounter (HOSPITAL_COMMUNITY): Payer: Medicare Other

## 2011-03-17 ENCOUNTER — Ambulatory Visit (HOSPITAL_COMMUNITY)
Admission: RE | Admit: 2011-03-17 | Discharge: 2011-03-17 | Disposition: A | Discharge status: Home / Self Care | Payer: Medicare Other | Source: Ambulatory Visit | Attending: General Surgery | Admitting: General Surgery

## 2011-03-17 DIAGNOSIS — Z01818 Encounter for other preprocedural examination: Secondary | ICD-10-CM

## 2011-03-17 DIAGNOSIS — Z01812 Encounter for preprocedural laboratory examination: Secondary | ICD-10-CM | POA: Insufficient documentation

## 2011-03-17 DIAGNOSIS — Z79899 Other long term (current) drug therapy: Secondary | ICD-10-CM | POA: Insufficient documentation

## 2011-03-17 DIAGNOSIS — R9431 Abnormal electrocardiogram [ECG] [EKG]: Secondary | ICD-10-CM | POA: Insufficient documentation

## 2011-03-17 DIAGNOSIS — Z01810 Encounter for preprocedural cardiovascular examination: Secondary | ICD-10-CM | POA: Insufficient documentation

## 2011-03-17 DIAGNOSIS — K8020 Calculus of gallbladder without cholecystitis without obstruction: Secondary | ICD-10-CM | POA: Insufficient documentation

## 2011-03-17 DIAGNOSIS — I10 Essential (primary) hypertension: Secondary | ICD-10-CM | POA: Insufficient documentation

## 2011-03-17 LAB — COMPREHENSIVE METABOLIC PANEL
ALT: 25 U/L (ref 0–35)
AST: 30 U/L (ref 0–37)
Albumin: 3.3 g/dL — ABNORMAL LOW (ref 3.5–5.2)
Alkaline Phosphatase: 103 U/L (ref 39–117)
BUN: 35 mg/dL — ABNORMAL HIGH (ref 6–23)
CO2: 30 mEq/L (ref 19–32)
Calcium: 9.5 mg/dL (ref 8.4–10.5)
Chloride: 102 mEq/L (ref 96–112)
Creatinine, Ser: 1.06 mg/dL (ref 0.50–1.10)
GFR calc Af Amer: 56 mL/min — ABNORMAL LOW (ref >90)
GFR calc non Af Amer: 49 mL/min — ABNORMAL LOW (ref >90)
Glucose, Bld: 107 mg/dL — ABNORMAL HIGH (ref 70–99)
Potassium: 4.4 mEq/L (ref 3.5–5.1)
Sodium: 140 mEq/L (ref 135–145)
Total Bilirubin: 0.6 mg/dL (ref 0.3–1.2)
Total Protein: 7.5 g/dL (ref 6.0–8.3)

## 2011-03-17 LAB — DIFFERENTIAL
Basophils Absolute: 0.1 10*3/uL (ref 0.0–0.1)
Basophils Relative: 1 % (ref 0–1)
Eosinophils Absolute: 0.3 10*3/uL (ref 0.0–0.7)
Eosinophils Relative: 4 % (ref 0–5)
Lymphocytes Relative: 26 % (ref 12–46)
Lymphs Abs: 2.2 10*3/uL (ref 0.7–4.0)
Monocytes Absolute: 0.9 10*3/uL (ref 0.1–1.0)
Monocytes Relative: 10 % (ref 3–12)
Neutro Abs: 5.1 10*3/uL (ref 1.7–7.7)
Neutrophils Relative %: 59 % (ref 43–77)

## 2011-03-17 LAB — SURGICAL PCR SCREEN
MRSA, PCR: NEGATIVE
Staphylococcus aureus: NEGATIVE

## 2011-03-17 LAB — CBC
HCT: 45.3 % (ref 36.0–46.0)
Hemoglobin: 14.2 g/dL (ref 12.0–15.0)
MCH: 29.5 pg (ref 26.0–34.0)
MCHC: 31.3 g/dL (ref 30.0–36.0)
MCV: 94 fL (ref 78.0–100.0)
Platelets: 195 10*3/uL (ref 150–400)
RBC: 4.82 MIL/uL (ref 3.87–5.11)
RDW: 16.3 % — ABNORMAL HIGH (ref 11.5–15.5)
WBC: 8.5 10*3/uL (ref 4.0–10.5)

## 2011-03-23 ENCOUNTER — Ambulatory Visit (HOSPITAL_COMMUNITY)
Admission: RE | Admit: 2011-03-23 | Discharge: 2011-03-24 | Disposition: A | Discharge status: Home / Self Care | Payer: Medicare Other | Source: Ambulatory Visit | Attending: General Surgery | Admitting: General Surgery

## 2011-03-23 ENCOUNTER — Other Ambulatory Visit (INDEPENDENT_AMBULATORY_CARE_PROVIDER_SITE_OTHER): Payer: Self-pay | Admitting: General Surgery

## 2011-03-23 ENCOUNTER — Ambulatory Visit (HOSPITAL_COMMUNITY): Payer: Medicare Other

## 2011-03-23 DIAGNOSIS — K8010 Calculus of gallbladder with chronic cholecystitis without obstruction: Secondary | ICD-10-CM | POA: Insufficient documentation

## 2011-03-23 DIAGNOSIS — Z79899 Other long term (current) drug therapy: Secondary | ICD-10-CM | POA: Insufficient documentation

## 2011-03-23 DIAGNOSIS — K219 Gastro-esophageal reflux disease without esophagitis: Secondary | ICD-10-CM | POA: Insufficient documentation

## 2011-03-23 DIAGNOSIS — Z7982 Long term (current) use of aspirin: Secondary | ICD-10-CM | POA: Insufficient documentation

## 2011-03-23 DIAGNOSIS — I10 Essential (primary) hypertension: Secondary | ICD-10-CM | POA: Insufficient documentation

## 2011-03-23 DIAGNOSIS — K811 Chronic cholecystitis: Secondary | ICD-10-CM

## 2011-03-23 DIAGNOSIS — Z86718 Personal history of other venous thrombosis and embolism: Secondary | ICD-10-CM | POA: Insufficient documentation

## 2011-03-23 DIAGNOSIS — Z8673 Personal history of transient ischemic attack (TIA), and cerebral infarction without residual deficits: Secondary | ICD-10-CM | POA: Insufficient documentation

## 2011-03-23 DIAGNOSIS — Z23 Encounter for immunization: Secondary | ICD-10-CM | POA: Insufficient documentation

## 2011-03-23 HISTORY — PX: CHOLECYSTECTOMY: SHX55

## 2011-03-26 NOTE — H&P (Signed)
[name], [name]                ACCOUNT NO.:  [id number]  MEDICAL RECORD NO.:  [id number]  LOCATION:  [facility]                         FACILITY:  [name]  PHYSICIAN:  Barbette Merino, M.D.      DATE OF BIRTH:  03-12-1930  DATE OF ADMISSION:  02/07/2011 DATE OF DISCHARGE:                             HISTORY & PHYSICAL   PRIMARY CARE PHYSICIAN:  Dr. [name].  GASTROENTEROLOGIST:  Docia Chuck. Henrene Pastor, MD  PRESENTING COMPLAINT:  Rectal bleed.  HISTORY OF PRESENT ILLNESS:  The patient is a 77 year old female with extensive medical history which includes known history of colonic polyps, internal hemorrhoids, and questionable diverticular disease. The patient is here secondary to what started as painless rectal bleed that started just yesterday.  It is now associated with some lower back pain.  She thought it was her hemorrhoids that were acting up.  Bleeding has been persistent.  She has had at least 3 bowel movements all of which was purely blood, one of which was witnessed here in the hospital. She denied any nausea or vomiting.  Denied any hematemesis.  No evidence of melena recently.  She has been taking one full-dose aspirin a day for a while but no other nonsteroidal antiinflammatory agents.  No other bleeding sites.  PAST MEDICAL HISTORY:  Extensive and includes: 1. History of cholecystitis status post cholecystectomy. 2. History of retained common bile duct stone.  She just had ERCP on     February 01, 2011 with removal of stent.  Questionable cholangitis at     the time. 3. History of ampullary stenosis. 4. She had biliary sphincterectomy in 2009. 5. History of colonic perforation after a colonoscopy in 1990s that     led to laparotomy, colonic polyps. 6. History of CVA involving the right thalamus in December 2003. 7. Hypertension. 8. Diabetes. 9. Gout. 10.Dyslipidemia. 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 12.History of ventral hernia repair in 2002 and 2001. 13.Diabetic neuropathy. 14.History of rheumatic fever. 15.History of essential tremors. 16.Osteoarthrosis.  PAST SURGICAL HISTORY: 1. Status post bilateral knee replacement. 2. Status post hip replacement. 3. History of skin cancer with surgical removal. 4. GERD. 5. History of DVT. 6. She is status post hysterectomy. 7. History of arrhythmias.  ALLERGIES: 1. CODEINE. 2. SULFA. CODEINE causes nausea and vomiting and SULFA causes hives.  CURRENT MEDICATIONS: 1. Aspirin 325 mg daily. 2. Supposed to be on Ciprofloxacin 500 mg b.i.d. to be completed     today. 3. Allopurinol 100 mg every other day and 2 tablets opposing days. 4. Amlodipine 10 mg at bedtime. 5. Artificial tears 1-2 drops both eyes 3 times a day. 6. Lasix 40 mg daily. 7. Lisinopril 40 mg daily.  SOCIAL HISTORY:  The patient lives here in [place] with her husband. No tobacco, alcohol, or IV drug use.  She has 3 children, 2 sons and 1 daughter.  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  FAMILY HISTORY:  Noncontributory.  REVIEW OF SYSTEMS:  All systems reviewed are negative except per HPI.  PHYSICAL EXAMINATION:  VITAL SIGNS:  Temperature 98.3, blood pressure 146/84, pulse 80, respiratory  rate 19, sat is 98% on room air. GENERAL:  She is awake, alert, oriented, who is in no acute distress. HEENT: PERRL.  EOMI.  No pallor, no jaundice.  No rhinorrhea. NECK:  Supple.  No visible JVD, no lymphadenopathy. RESPIRATORY:  She has good air entry bilaterally.  No wheezes.  No rales.  No crackles. CARDIOVASCULAR:  She has S1, S2 at this point.  No murmur. ABDOMEN:  Obese, soft, nontender, has positive bowel sounds. EXTREMITIES:  No edema, cyanosis, or clubbing. SKIN:  No significant rashes or ulcers. MUSCULOSKELETAL:  No active joint swelling, tenderness. RECTAL:  Gross blood on the rectum which is guaiac-positive.  It is mixed slightly with  stool.  Stool guaiac is positive.  White count 9.2, hemoglobin 14.0 with platelets of 200.  Sodium is 141, potassium 4.0, chloride 104, CO2 of 31, glucose 173, BUN 32, creatinine 1.02 with a calcium of 9.3.  ASSESSMENT:  This is a 77 year old female with known history of colonic polyps and hemorrhoids presenting with gastrointestinal bleed, mainly rectal bleed at this point.  PLAN: 1. Rectal bleed.  Admit the patient to monitored bed.  She seems     hemodynamically stable and her hemoglobin is stable, but we will     follow serial CBCs.  Two wide bore IVs, IV Protonix, IV saline.  We     will also type and cross match for 2 units of packed red blood     cells and hold just in case her hemoglobin drops.  GI has been     consulted and we will await their inputs. 2. Hypertension.  In the setting of GI bleed, I will hold her     medications we are giving p.o.  I will hold the Lasix as well. 3. History of ERCP.  Again, this was done just a couple of days ago.     I doubt this is related to her current bleed and the side of her     pain is more of the lower abdomen and back.  We will let GI get     involved also. 4. Prior history of DVT.  I will put her on PAS hose in the setting of     GI bleed. 5. Diabetes.  The patient apparently has diet-controlled diabetes.     She is not on any antidiabetic medications so will watch what she     eats, and also sliding scale insulin if necessary.     Barbette Merino, M.D.     [place]  D:  02/08/2011  T:  02/08/2011  Job:  [id number]  Electronically Signed by Barbette Merino M.D. on 03/26/2011 02:59:31 PM

## 2011-04-01 ENCOUNTER — Telehealth (INDEPENDENT_AMBULATORY_CARE_PROVIDER_SITE_OTHER): Payer: Self-pay | Admitting: General Surgery

## 2011-04-04 NOTE — Op Note (Signed)
[name], [name]                ACCOUNT NO.:  [id number]  MEDICAL RECORD NO.:  [id number]  LOCATION:  37                         FACILITY:  [facility]  PHYSICIAN:  Marland Kitchen T. Zarielle Cea, M.D.DATE OF BIRTH:  [birth year]  DATE OF PROCEDURE:  03/25/2011 DATE OF DISCHARGE:                              OPERATIVE REPORT   PREOPERATIVE DIAGNOSES:  Cholelithiasis and cholecystitis.  POSTOPERATIVE DIAGNOSES:  Cholelithiasis and cholecystitis.  SURGICAL PROCEDURE:  Laparoscopic cholecystectomy with intraoperative cholangiogram.  SURGEON:  Marland Kitchen T. Bijon Mineer, M.D.  ANESTHESIA:  General.  HISTORY OF PRESENT ILLNESS:  [name] is a 77 year old female who presented this summer with acute severe epigastric pain and elevated liver function tests.  She has a previous history of sphincterotomy for presumed ampullary stenosis in 2008.  She did not have documented gallstones at that time.  The patient subsequently underwent ERCP withfindings of multiple common bile duct stones.  A very large stone was initially left behind with the stent and subsequently this was removed by Dr. Henrene Pastor a number of weeks ago.  We discussed options with the patient including expected management versus cholecystectomy to prevent further complications from her gallstones and after discussion with Dr. Henrene Pastor the patient and myself we have elected proceed with laparoscopic cholecystectomy with intraoperative cholangiogram.  The nature of the procedure, its indications, risks of anesthetic complications, bleeding, infection, bile leak, bile duct injury, possible need for open procedure were discussed and understood.  She was now brought to operating room for this procedure.  DESCRIPTION OF OPERATION:  The patient was brought to operating room, placed supine position on the operating table and general endotracheal anesthesia was induced.  She received preoperative IV antibiotics with subcutaneous heparin.  PAS was in  place.  The abdomen was widely sterilely prepped and draped.  Correct patient procedure were verified. Access was obtained with a 1.5 cm incision just above the umbilicus to the previous low midline incision.  Dissection was carried down in the midline fascia, which was incised for 1 cm and the peritoneum entered under direct vision.  Through a mattress suture of 0 Vicryl, the Hasson trocar was placed and pneumoperitoneum established.  Under direct vision, an 11 mm trocar was placed, subxiphoid and two 5 mm trocars along the right subcostal margin.  The patient was placed in reverse Trendelenburg.  The fundus of the gallbladder was grasped and elevated up over the liver.  There were some chronic omental adhesions were carefully taken down with cautery and the infundibulum exposed and retracted inferolaterally.  Peritoneum anterior-posterior to Calot triangle was incised and fibrofatty tissue was stripped off the neck of the gallbladder toward the porta hepatis.  The distal gallbladder was thoroughly dissected and close triangle skeletonized and good critical view obtained.  The cystic artery was clearly seen coursing through Calot triangle up onto the gallbladder wall and it was divided between 2 proximal, 1 distal clip.  The cystic duct was then completely skeletonized over about a centimeter and a half and was clipped at the gallbladder junction.  Operative cholangiogram was then obtained through the cystic duct, which showed good filling of a moderately enlarged common bile duct and intrahepatic ducts with good flow  into the duodenum and no filling defects.  Following this, cholangiocath was removed and the cystic duct was triply clipped proximally and divided.  The gallbladder was then dissected free from its bed using hook cautery, placed in an EndoCatch bag and brought out through the periumbilical incision.  The right upper quadrant was irrigated.  There was some mild bleeding from  a slight capsular tear just posterior to the gallbladder, which was controlled completely with cautery.  The area was further irrigated and hemostasis assured.  A Surgicel pack was left in the gallbladder bed.  All CO2 was evacuated and trocars removed.  The mattress suture was secured at the umbilicus.  Skin incisions were closed with subcuticular Monocryl and Dermabond.  Sponges and needle counts were correct.  The patient was taken to recovery in good condition.     Darene Lamer. Tyrone Balash, M.D.     [place]  D:  03/23/2011  T:  03/24/2011  Job:  [id number]  cc:   Docia Chuck. Henrene Pastor, [place] [id number]  Electronically Signed by Excell Seltzer M.D. on 04/04/2011 02:44:51 PM

## 2011-04-15 ENCOUNTER — Ambulatory Visit (INDEPENDENT_AMBULATORY_CARE_PROVIDER_SITE_OTHER): Payer: Medicare Other | Admitting: General Surgery

## 2011-04-15 ENCOUNTER — Encounter (INDEPENDENT_AMBULATORY_CARE_PROVIDER_SITE_OTHER): Payer: Self-pay | Admitting: General Surgery

## 2011-04-15 VITALS — BP 170/104 | HR 80 | Temp 96.6°F | Resp 20 | Ht 66.0 in | Wt 235.4 lb

## 2011-04-15 DIAGNOSIS — Z09 Encounter for follow-up examination after completed treatment for conditions other than malignant neoplasm: Secondary | ICD-10-CM

## 2011-04-15 NOTE — Progress Notes (Signed)
History: Patient returns following laparoscopic cholecystectomy with history of large common bile duct stone removed by preoperative ERCP. She reports she is doing very well. 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 She has no abdominal pain or GI complaints.  Exam: Vital signs are unremarkable. She appears well. Abdomen is soft and nontender and wounds well healed.  Assessment and plan: Doing very well following laparoscopic cholecystectomy without complication identified. She is discharged to return as needed.

## 2011-05-20 ENCOUNTER — Other Ambulatory Visit: Payer: Self-pay | Admitting: Internal Medicine

## 2011-05-20 DIAGNOSIS — Z1231 Encounter for screening mammogram for malignant neoplasm of breast: Secondary | ICD-10-CM

## 2011-05-23 ENCOUNTER — Ambulatory Visit
Admission: RE | Admit: 2011-05-23 | Discharge: 2011-05-23 | Disposition: A | Discharge status: Home / Self Care | Payer: Medicare Other | Source: Ambulatory Visit | Attending: Internal Medicine | Admitting: Internal Medicine

## 2011-05-23 DIAGNOSIS — Z1231 Encounter for screening mammogram for malignant neoplasm of breast: Secondary | ICD-10-CM

## 2011-05-25 ENCOUNTER — Other Ambulatory Visit: Payer: Self-pay | Admitting: Internal Medicine

## 2011-05-25 DIAGNOSIS — R928 Other abnormal and inconclusive findings on diagnostic imaging of breast: Secondary | ICD-10-CM

## 2011-06-09 ENCOUNTER — Other Ambulatory Visit: Payer: Self-pay | Admitting: Internal Medicine

## 2011-06-09 ENCOUNTER — Ambulatory Visit
Admission: RE | Admit: 2011-06-09 | Discharge: 2011-06-09 | Disposition: A | Discharge status: Home / Self Care | Payer: Medicare Other | Source: Ambulatory Visit | Attending: Internal Medicine | Admitting: Internal Medicine

## 2011-06-09 DIAGNOSIS — R928 Other abnormal and inconclusive findings on diagnostic imaging of breast: Secondary | ICD-10-CM

## 2011-06-10 ENCOUNTER — Other Ambulatory Visit: Payer: Self-pay | Admitting: Internal Medicine

## 2011-06-10 ENCOUNTER — Ambulatory Visit
Admission: RE | Admit: 2011-06-10 | Discharge: 2011-06-10 | Disposition: A | Discharge status: Home / Self Care | Payer: Medicare Other | Source: Ambulatory Visit | Attending: Internal Medicine | Admitting: Internal Medicine

## 2011-06-10 ENCOUNTER — Encounter (INDEPENDENT_AMBULATORY_CARE_PROVIDER_SITE_OTHER): Payer: Self-pay | Admitting: Surgery

## 2011-06-10 ENCOUNTER — Ambulatory Visit (INDEPENDENT_AMBULATORY_CARE_PROVIDER_SITE_OTHER): Payer: Medicare Other | Admitting: Surgery

## 2011-06-10 VITALS — BP 162/94 | HR 88 | Temp 97.9°F | Resp 24 | Ht 66.0 in | Wt 247.4 lb

## 2011-06-10 DIAGNOSIS — R928 Other abnormal and inconclusive findings on diagnostic imaging of breast: Secondary | ICD-10-CM

## 2011-06-10 DIAGNOSIS — C50919 Malignant neoplasm of unspecified site of unspecified female breast: Secondary | ICD-10-CM

## 2011-06-10 DIAGNOSIS — C50911 Malignant neoplasm of unspecified site of right female breast: Secondary | ICD-10-CM

## 2011-06-10 NOTE — Progress Notes (Signed)
Chief Complaint: right breast cancer  History of Present Illness:  [name] is an 77 y.o. female Patient well known to me going back to my treating her for a perforated colon from colonoscopy back in February 1995. She subsequently had 2 mile a lipomatous excised from her sacral region. She had a suspicious area found in the right breast on mammography with an area that was seen about 1.5 cm in greatest diameter 7 cm from the nipple at the 10:00 position that was ultrasound-guided core biopsy shown to contain invasive lobular carcinoma. Ultrasound axilla did not show any evidence of lymphadenopathy. An MRI is scheduled for January 4.  She received this news this afternoon in the breast center and she was able to come and see me on the same day. She was examined and we discussed options including mastectomy but what I would recommend as our first line as a lumpectomy with sentinel lymph node biopsy. This is a lobular cancer and wanted to wait and see what the MRI demonstrates. In the meantime on ago and fell out preoperative orders for this procedure can day surgery.wa very good job in most often here  Past Medical History  Diagnosis Date  . Hypertension   . Hyperlipidemia   . Morbid obesity   . Osteoarthritis   . Gout   . CVA (cerebral infarction)   . IBS (irritable bowel syndrome)   . Diverticulosis   . Esophageal stricture   . Colon polyps     hyperplastic  . Anxiety   . Hiatal hernia     4 cm  . DVT (deep vein thrombosis) in pregnancy   . Shingles 02/21/2011  . Skin cancer   . Breast cancer     right breast    Past Surgical History  Procedure Date  . Abdominal hysterectomy   . Tonsillectomy   . Bile duct stent placement   . Pelvic bone tumor removal   . Colonic perforation repair   . Total hip arthroplasty     right  . Replacement total knee bilateral   . Joint replacement   . Colon surgery   . Cholecystectomy 03/23/11    lap chole     Current Outpatient  Prescriptions  Medication Sig Dispense Refill  . allopurinol (ZYLOPRIM) 100 MG tablet Take 100 mg by mouth daily. Take 2 tablets every other day and 1 tablet every other day       . amLODipine (NORVASC) 5 MG tablet Take 5 mg by mouth daily.        Marland Kitchen aspirin 325 MG tablet Take 325 mg by mouth daily.        . furosemide (LASIX) 40 MG tablet Take 40 mg by mouth daily.        Marland Kitchen lisinopril (PRINIVIL,ZESTRIL) 20 MG tablet Take 40 mg by mouth daily.         Atorvastatin; Codeine; Oxycodone-acetaminophen; Rosuvastatin; Simvastatin; and Sulfonamide derivatives Family History  Problem Relation Age of Onset  . Breast cancer Sister     x 2  . Cancer Sister     breast  . Prostate cancer Brother   . Colon cancer Neg Hx   . Stroke Mother   . Kidney failure Sister    Social History:   reports that she has never smoked. She has never used smokeless tobacco. She reports that she does not drink alcohol or use illicit drugs.   REVIEW OF SYSTEMS - PERTINENT POSITIVES ONLY: Noncontributory   Physical Exam:  Blood pressure 162/94, pulse 88, temperature 97.9 F (36.6 C), temperature source Temporal, resp. rate 24, height 5\' 6"  (1.676 m), weight 247 lb 6.4 oz (112.22 kg). Body mass index is 39.93 kg/(m^2).  Gen:  WDWN white female NAD  Neurological: Alert and oriented to person, place, and time. Motor and sensory function is grossly intact  Head: Normocephalic and atraumatic.  Eyes: Conjunctivae are normal. Pupils are equal, round, and reactive to light. No scleral icterus.  Neck: Normal range of motion. Neck supple. No tracheal deviation or thyromegaly present.  Cardiovascular:  SR without murmurs or gallops.  No carotid bruits Breast:  Ptotic breasts with no palpable mass on the right where there is a bruise.  No axillary adenopathy Respiratory: Effort normal.  No respiratory distress. No chest wall tenderness. Breath sounds normal.  No wheezes, rales or rhonchi.  Abdomen:   nontender  GU: Musculoskeletal: . Extremities are nontender. No cyanosis, edema or clubbing noted Lymphadenopathy: No cervical, preauricular, postauricular or axillary adenopathy is present Skin: Skin is warm and dry. No rash noted. No diaphoresis. No erythema. No pallor. Pscyh: Normal mood and affect. Behavior is normal. Judgment and thought content normal.   LABORATORY RESULTS: No results found for this or any previous visit (from the past 48 hour(s)).  RADIOLOGY RESULTS: US Breast Right  06/09/2011  *RADIOLOGY REPORT*  Clinical Data:  The patient returns after screening study for evaluation of the right breast.  The patient has two sisters diagnosed with breast cancer, at ages 29 and 1.  DIGITAL DIAGNOSTIC RIGHT MAMMOGRAM  AND RIGHT BREAST ULTRASOUND:  Comparison:  05/23/2011 and earlier  Findings:  Spot compression views are performed of the right breast, confirming presence of distortion and density in the upper- outer quadrant.  On physical exam, I palpate no abnormality within the upper outer quadrant of the right breast.  There is slight right nipple retraction.  Ultrasound is performed, showing in the area of acoustic shadowing in the 10 o'clock location of the right breast 7 cm from the nipple, measuring approximately 1.3 x 1.1 x 1.5 cm.  Evaluation of the right axilla is unremarkable.  IMPRESSION: Persistent distortion seen mammographically corresponds to an area of acoustic shadowing in the 10 o'clock location of the right breast by ultrasound.  Ultrasound-guided core biopsy is recommended because of concern for malignancy, possibly invasive lobular carcinoma.  The patient requests biopsy on the same day.  This is performed and dictated separately.  BI-RADS CATEGORY 4:  Suspicious abnormality - biopsy should be considered.  Original Report Authenticated By: Glenice Bow, M.D.   [place] Core Biopsy  06/09/2011  *RADIOLOGY REPORT*  Clinical Data:  Area of distortion, acoustic shadowing in  the 10 o'clock location of the right breast.  ULTRASOUND GUIDED VACUUM ASSISTED CORE BIOPSY OF THE RIGHT BREAST  Using sterile technique, 2% lidocaine, ultrasound guidance, and a 12 gauge vacuum assisted needle, biopsy was performed of area of acoustic shadowing in the 10 o'clock location of the right breast, using a lateral approach.  At the conclusion of the procedure, a tissue marker clip was deployed into the biopsy cavity.  Follow-up 2-view mammogram was performed and dictated separately.  IMPRESSION: Ultrasound-guided biopsy of right breast.  No apparent complications.  Original Report Authenticated By: Glenice Bow, M.D.   [place] R  06/09/2011  *RADIOLOGY REPORT*  Clinical Data:  The patient returns after screening study for evaluation of the right breast.  The patient has two sisters diagnosed with breast  cancer, at ages 16 and 50.  DIGITAL DIAGNOSTIC RIGHT MAMMOGRAM  AND RIGHT BREAST ULTRASOUND:  Comparison:  05/23/2011 and earlier  Findings:  Spot compression views are performed of the right breast, confirming presence of distortion and density in the upper- outer quadrant.  On physical exam, I palpate no abnormality within the upper outer quadrant of the right breast.  There is slight right nipple retraction.  Ultrasound is performed, showing in the area of acoustic shadowing in the 10 o'clock location of the right breast 7 cm from the nipple, measuring approximately 1.3 x 1.1 x 1.5 cm.  Evaluation of the right axilla is unremarkable.  IMPRESSION: Persistent distortion seen mammographically corresponds to an area of acoustic shadowing in the 10 o'clock location of the right breast by ultrasound.  Ultrasound-guided core biopsy is recommended because of concern for malignancy, possibly invasive lobular carcinoma.  The patient requests biopsy on the same day.  This is performed and dictated separately.  BI-RADS CATEGORY 4:  Suspicious abnormality - biopsy should be considered.   Original Report Authenticated By: Glenice Bow, M.D.   [facility] R  06/09/2011  *RADIOLOGY REPORT*  Clinical Data:  Status post ultrasound guided core biopsy of area of shadowing in the 10 o'clock location of the right breast.  DIGITAL DIAGNOSTIC RIGHT MAMMOGRAM  Comparison:  06/09/2011, 05/23/2011 and earlier  Findings:  Films are performed following ultrasound guided biopsy of area of shadowing in the 10 o'clock location of the right breast.  A ribbon shaped clip is identified in the upper-outer quadrant of the right breast, in the area of distortion seen mammographically.  IMPRESSION: Tissue marker clip is in expected location after biopsy.  Original Report Authenticated By: Glenice Bow, M.D.   [facility]  06/10/2011  *RADIOLOGY REPORT*  ESTABLISHED PATIENT OFFICE VISIT - LEVEL II [phone])  Chief Complaint:  The patient presents for follow-up after ultrasound guided core biopsy.  Pathology shows invasive ductal carcinoma, correlating well with the imaging appearance.  History:  New area of distortion and shadowing in the upper-outer quadrant of the right breast.  Exam:  Right breast is clean and dry.  There is an area of ecchymosis in the lateral portion of the right breast.  There is no palpable hematoma.  Assessment and Plan:  The patient will see Dr. Hassell Done for surgical consultation today.  MRI has been scheduled 06/17/2011.  The patient was given Scientist, clinical (histocompatibility and immunogenetics).  The plan was discussed and questions were answered.  Original Report Authenticated By: Glenice Bow, M.D.    Problem List: Patient Active Problem List  Diagnoses  . POLYP, COLON  . DIABETES MELLITUS, TYPE II  . HYPERLIPIDEMIA  . GOUT  . ANXIETY  . PERIPHERAL NEUROPATHY  . HYPERTENSION  . CVA  . DIVERTICULOSIS, COLON  . IRRITABLE BOWEL, PREDOMINANTLY CONSTIPATION  . FATIGUE  . COLONIC POLYPS, HX OF  . TREMOR  . Bile duct calculus with nonacute cholecystitis  .  Morbid obesity  . Preventative health care  . Shingles    Assessment & Plan: Right invasive lobular carcinoma of the breast. Plan preliminary schedule for lumpectomy with sentinel lymph node biopsy on the right.    [name], MD, [facility] [phone] beeper [phone]  06/10/2011 5:54 PM

## 2011-06-17 ENCOUNTER — Ambulatory Visit
Admission: RE | Admit: 2011-06-17 | Discharge: 2011-06-17 | Disposition: A | Discharge status: Home / Self Care | Payer: Medicare Other | Source: Ambulatory Visit | Attending: Internal Medicine | Admitting: Internal Medicine

## 2011-06-17 DIAGNOSIS — C50911 Malignant neoplasm of unspecified site of right female breast: Secondary | ICD-10-CM

## 2011-06-17 MED ORDER — GADOBENATE DIMEGLUMINE 529 MG/ML IV SOLN
20.0000 mL | Freq: Once | INTRAVENOUS | Status: AC | PRN
  Administered 2011-06-17: 20 mL via INTRAVENOUS

## 2011-06-22 ENCOUNTER — Encounter (HOSPITAL_COMMUNITY): Payer: Self-pay | Admitting: Respiratory Therapy

## 2011-06-24 ENCOUNTER — Other Ambulatory Visit (INDEPENDENT_AMBULATORY_CARE_PROVIDER_SITE_OTHER): Payer: Self-pay

## 2011-06-24 DIAGNOSIS — C50919 Malignant neoplasm of unspecified site of unspecified female breast: Secondary | ICD-10-CM

## 2011-06-27 ENCOUNTER — Encounter (HOSPITAL_COMMUNITY): Payer: Self-pay

## 2011-06-27 ENCOUNTER — Encounter (HOSPITAL_COMMUNITY)
Admission: RE | Admit: 2011-06-27 | Discharge: 2011-06-27 | Disposition: A | Discharge status: Home / Self Care | Payer: Medicare Other | Source: Ambulatory Visit | Attending: Surgery | Admitting: Surgery

## 2011-06-27 HISTORY — DX: Cardiac arrhythmia, unspecified: I49.9

## 2011-06-27 HISTORY — DX: Gastro-esophageal reflux disease without esophagitis: K21.9

## 2011-06-27 HISTORY — DX: Encounter for other specified aftercare: Z51.89

## 2011-06-27 HISTORY — DX: Cerebral infarction, unspecified: I63.9

## 2011-06-27 HISTORY — DX: Chronic kidney disease, unspecified: N18.9

## 2011-06-27 HISTORY — DX: Reserved for inherently not codable concepts without codable children: IMO0001

## 2011-06-27 LAB — CBC
HCT: 46.9 % — ABNORMAL HIGH (ref 36.0–46.0)
Hemoglobin: 14.8 g/dL (ref 12.0–15.0)
MCH: 29.8 pg (ref 26.0–34.0)
MCHC: 31.6 g/dL (ref 30.0–36.0)
MCV: 94.4 fL (ref 78.0–100.0)
Platelets: 203 10*3/uL (ref 150–400)
RBC: 4.97 MIL/uL (ref 3.87–5.11)
RDW: 15.4 % (ref 11.5–15.5)
WBC: 7.8 10*3/uL (ref 4.0–10.5)

## 2011-06-27 LAB — BASIC METABOLIC PANEL
BUN: 28 mg/dL — ABNORMAL HIGH (ref 6–23)
CO2: 34 mEq/L — ABNORMAL HIGH (ref 19–32)
Calcium: 9.8 mg/dL (ref 8.4–10.5)
Chloride: 103 mEq/L (ref 96–112)
Creatinine, Ser: 1.17 mg/dL — ABNORMAL HIGH (ref 0.50–1.10)
GFR calc Af Amer: 50 mL/min — ABNORMAL LOW (ref >90)
GFR calc non Af Amer: 43 mL/min — ABNORMAL LOW (ref >90)
Glucose, Bld: 140 mg/dL — ABNORMAL HIGH (ref 70–99)
Potassium: 4.2 mEq/L (ref 3.5–5.1)
Sodium: 144 mEq/L (ref 135–145)

## 2011-06-27 LAB — SURGICAL PCR SCREEN
MRSA, PCR: NEGATIVE
Staphylococcus aureus: POSITIVE — AB

## 2011-06-27 NOTE — Pre-Procedure Instructions (Addendum)
[place]  06/27/2011   Your procedure is scheduled on:  06/29/11  Report to [facility] at 630 AM.  Call this number if you have problems the morning of surgery: [phone]   Remember:   Do not eat food:After Midnight.  May have clear liquids: up to 4 Hours before arrival.  Clear liquids include soda, tea, black coffee, apple or grape juice, broth.  Take these medicines the morning of surgery with A SIP OF WATER: norvasc, allopurinol  STOP asa   Do not wear jewelry, make-up or nail polish.  Do not wear lotions, powders, or perfumes. You may wear deodorant.  Do not shave 48 hours prior to surgery.  Do not bring valuables to the hospital.  Contacts, dentures or bridgework may not be worn into surgery.  Leave suitcase in the car. After surgery it may be brought to your room.  For patients admitted to the hospital, checkout time is 11:00 AM the day of discharge.   Patients discharged the day of surgery will not be allowed to drive home.  Name and phone number of your driver: Roselie Awkward [phone]  Special Instructions: CHG Shower Use Special Wash: 1/2 bottle night before surgery and 1/2 bottle morning of surgery.   Please read over the following fact sheets that you were given: Pain Booklet, Coughing and Deep Breathing, MRSA Information and Surgical Site Infection Prevention

## 2011-06-27 NOTE — Progress Notes (Addendum)
Dr Earlie Server Office called for orders by Dani Gobble rn  Dr bensimhon office called by tarra for last notes ,tests.ekg, cxr in epic from 10/12   Echo from 4/08

## 2011-06-28 NOTE — Progress Notes (Signed)
REQUESTED ANY CARDIAC TEST AND LAST OFFICE NOTE FROM [facility].  KIM  WILL FAX ECHO AND OFFICE NOTE.  ALSO RE-REQUESTED ORDERS FROM DR Carlye Grippe OFFICE , TRACY TO SEND MESSAGE TO MD.

## 2011-06-29 ENCOUNTER — Ambulatory Visit (HOSPITAL_COMMUNITY): Payer: Medicare Other | Admitting: Anesthesiology

## 2011-06-29 ENCOUNTER — Ambulatory Visit
Admission: RE | Admit: 2011-06-29 | Discharge: 2011-06-29 | Disposition: A | Discharge status: Home / Self Care | Payer: Medicare Other | Source: Ambulatory Visit | Attending: Surgery | Admitting: Surgery

## 2011-06-29 ENCOUNTER — Ambulatory Visit (HOSPITAL_COMMUNITY)
Admission: RE | Admit: 2011-06-29 | Discharge: 2011-07-01 | Disposition: A | Discharge status: Home / Self Care | Payer: Medicare Other | Source: Ambulatory Visit | Attending: Surgery | Admitting: Surgery

## 2011-06-29 ENCOUNTER — Encounter (HOSPITAL_COMMUNITY)
Admission: RE | Disposition: A | Discharge status: Home / Self Care | Payer: Self-pay | Source: Ambulatory Visit | Attending: Surgery

## 2011-06-29 ENCOUNTER — Other Ambulatory Visit (INDEPENDENT_AMBULATORY_CARE_PROVIDER_SITE_OTHER): Payer: Self-pay | Admitting: Surgery

## 2011-06-29 ENCOUNTER — Encounter (HOSPITAL_COMMUNITY): Payer: Self-pay | Admitting: *Deleted

## 2011-06-29 ENCOUNTER — Encounter (HOSPITAL_COMMUNITY): Payer: Self-pay | Admitting: General Practice

## 2011-06-29 ENCOUNTER — Ambulatory Visit (HOSPITAL_COMMUNITY)
Admission: RE | Admit: 2011-06-29 | Discharge: 2011-06-29 | Disposition: A | Discharge status: Home / Self Care | Payer: Medicare Other | Source: Ambulatory Visit | Attending: Surgery | Admitting: Surgery

## 2011-06-29 ENCOUNTER — Encounter (HOSPITAL_COMMUNITY): Payer: Self-pay | Admitting: Anesthesiology

## 2011-06-29 DIAGNOSIS — C50419 Malignant neoplasm of upper-outer quadrant of unspecified female breast: Secondary | ICD-10-CM | POA: Insufficient documentation

## 2011-06-29 DIAGNOSIS — E785 Hyperlipidemia, unspecified: Secondary | ICD-10-CM | POA: Insufficient documentation

## 2011-06-29 DIAGNOSIS — C50919 Malignant neoplasm of unspecified site of unspecified female breast: Secondary | ICD-10-CM

## 2011-06-29 DIAGNOSIS — Z8673 Personal history of transient ischemic attack (TIA), and cerebral infarction without residual deficits: Secondary | ICD-10-CM | POA: Insufficient documentation

## 2011-06-29 DIAGNOSIS — I10 Essential (primary) hypertension: Secondary | ICD-10-CM | POA: Insufficient documentation

## 2011-06-29 DIAGNOSIS — M1990 Unspecified osteoarthritis, unspecified site: Secondary | ICD-10-CM | POA: Insufficient documentation

## 2011-06-29 DIAGNOSIS — Z01812 Encounter for preprocedural laboratory examination: Secondary | ICD-10-CM | POA: Insufficient documentation

## 2011-06-29 DIAGNOSIS — E119 Type 2 diabetes mellitus without complications: Secondary | ICD-10-CM | POA: Insufficient documentation

## 2011-06-29 HISTORY — DX: Anemia, unspecified: D64.9

## 2011-06-29 HISTORY — DX: Urinary tract infection, site not specified: N39.0

## 2011-06-29 HISTORY — DX: Other specified postprocedural states: Z98.890

## 2011-06-29 HISTORY — DX: Calculus of kidney: N20.0

## 2011-06-29 HISTORY — DX: Pneumonia, unspecified organism: J18.9

## 2011-06-29 HISTORY — PX: BREAST LUMPECTOMY: SHX2

## 2011-06-29 HISTORY — DX: Nausea with vomiting, unspecified: R11.2

## 2011-06-29 HISTORY — DX: Bronchitis, not specified as acute or chronic: J40

## 2011-06-29 HISTORY — DX: Acute embolism and thrombosis of unspecified deep veins of unspecified lower extremity: I82.409

## 2011-06-29 HISTORY — DX: Unspecified atrial flutter: I48.92

## 2011-06-29 HISTORY — DX: Shortness of breath: R06.02

## 2011-06-29 LAB — CBC
HCT: 36.2 % (ref 36.0–46.0)
Hemoglobin: 11.3 g/dL — ABNORMAL LOW (ref 12.0–15.0)
MCH: 29.5 pg (ref 26.0–34.0)
MCHC: 31.2 g/dL (ref 30.0–36.0)
MCV: 94.5 fL (ref 78.0–100.0)
Platelets: 243 10*3/uL (ref 150–400)
RBC: 3.83 MIL/uL — ABNORMAL LOW (ref 3.87–5.11)
RDW: 15.4 % (ref 11.5–15.5)
WBC: 17.4 10*3/uL — ABNORMAL HIGH (ref 4.0–10.5)

## 2011-06-29 LAB — CREATININE, SERUM
Creatinine, Ser: 1.53 mg/dL — ABNORMAL HIGH (ref 0.50–1.10)
GFR calc Af Amer: 36 mL/min — ABNORMAL LOW (ref >90)
GFR calc non Af Amer: 31 mL/min — ABNORMAL LOW (ref >90)

## 2011-06-29 SURGERY — BREAST LUMPECTOMY WITH EXCISION OF SENTINEL NODE
Anesthesia: General | Site: Breast | Laterality: Right | Wound class: Clean

## 2011-06-29 MED ORDER — DIPHENHYDRAMINE HCL 50 MG/ML IJ SOLN: 12.5000 mg | Freq: Four times a day (QID) | INTRAMUSCULAR | Status: DC | PRN

## 2011-06-29 MED ORDER — METHYLENE BLUE 1 % INJ SOLN
INTRAMUSCULAR | Status: DC | PRN
  Administered 2011-06-29: 2 mL via SUBMUCOSAL

## 2011-06-29 MED ORDER — SODIUM CHLORIDE 0.9 % IJ SOLN
INTRAMUSCULAR | Status: DC | PRN
  Administered 2011-06-29: 3 mL via INTRAVENOUS

## 2011-06-29 MED ORDER — NEOSTIGMINE METHYLSULFATE 1 MG/ML IJ SOLN
INTRAMUSCULAR | Status: DC | PRN
  Administered 2011-06-29: 3 mg via INTRAVENOUS

## 2011-06-29 MED ORDER — TECHNETIUM TC 99M SULFUR COLLOID FILTERED
1.0000 | Freq: Once | INTRAVENOUS | Status: AC | PRN
  Administered 2011-06-29: 1 via INTRADERMAL

## 2011-06-29 MED ORDER — 0.9 % SODIUM CHLORIDE (POUR BTL) OPTIME
TOPICAL | Status: DC | PRN
  Administered 2011-06-29: 1000 mL

## 2011-06-29 MED ORDER — LACTATED RINGERS IV SOLN
INTRAVENOUS | Status: DC
  Administered 2011-06-29: 10:00:00 via INTRAVENOUS

## 2011-06-29 MED ORDER — CEFAZOLIN SODIUM-DEXTROSE 2-3 GM-% IV SOLR
2.0000 g | Freq: Once | INTRAVENOUS | Status: AC
  Administered 2011-06-29: 2 g via INTRAVENOUS

## 2011-06-29 MED ORDER — CEFAZOLIN SODIUM-DEXTROSE 2-3 GM-% IV SOLR
INTRAVENOUS | Status: AC
  Filled 2011-06-29: qty 50

## 2011-06-29 MED ORDER — DIPHENHYDRAMINE HCL 12.5 MG/5ML PO ELIX
12.5000 mg | ORAL_SOLUTION | Freq: Four times a day (QID) | ORAL | Status: DC | PRN
  Filled 2011-06-29: qty 10

## 2011-06-29 MED ORDER — TRAMADOL-ACETAMINOPHEN 37.5-325 MG PO TABS
2.0000 | ORAL_TABLET | ORAL | Status: AC
  Administered 2011-06-29: 2 via ORAL
  Filled 2011-06-29: qty 2

## 2011-06-29 MED ORDER — LACTATED RINGERS IV SOLN
INTRAVENOUS | Status: DC | PRN
  Administered 2011-06-29 (×2): via INTRAVENOUS

## 2011-06-29 MED ORDER — PROPOFOL 10 MG/ML IV EMUL
INTRAVENOUS | Status: DC | PRN
  Administered 2011-06-29: 130 mg via INTRAVENOUS

## 2011-06-29 MED ORDER — FENTANYL CITRATE 0.05 MG/ML IJ SOLN
INTRAMUSCULAR | Status: AC
  Filled 2011-06-29: qty 2

## 2011-06-29 MED ORDER — OXYCODONE HCL 5 MG PO TABS: 5.0000 mg | ORAL_TABLET | ORAL | Status: DC | PRN

## 2011-06-29 MED ORDER — ONDANSETRON HCL 4 MG/2ML IJ SOLN: 4.0000 mg | Freq: Four times a day (QID) | INTRAMUSCULAR | Status: DC | PRN

## 2011-06-29 MED ORDER — ONDANSETRON HCL 4 MG/2ML IJ SOLN
4.0000 mg | Freq: Once | INTRAMUSCULAR | Status: AC | PRN
  Administered 2011-06-29: 4 mg via INTRAVENOUS

## 2011-06-29 MED ORDER — HYDROMORPHONE HCL PF 1 MG/ML IJ SOLN: 0.2500 mg | INTRAMUSCULAR | Status: DC | PRN

## 2011-06-29 MED ORDER — EPHEDRINE SULFATE 50 MG/ML IJ SOLN
INTRAMUSCULAR | Status: DC | PRN
  Administered 2011-06-29 (×5): 5 mg via INTRAVENOUS

## 2011-06-29 MED ORDER — KCL IN DEXTROSE-NACL 20-5-0.45 MEQ/L-%-% IV SOLN
INTRAVENOUS | Status: DC
  Administered 2011-06-29 – 2011-07-01 (×4): via INTRAVENOUS
  Filled 2011-06-29 (×6): qty 1000

## 2011-06-29 MED ORDER — ROCURONIUM BROMIDE 100 MG/10ML IV SOLN
INTRAVENOUS | Status: DC | PRN
  Administered 2011-06-29: 30 mg via INTRAVENOUS

## 2011-06-29 MED ORDER — HEPARIN SODIUM (PORCINE) 5000 UNIT/ML IJ SOLN
5000.0000 [IU] | Freq: Three times a day (TID) | INTRAMUSCULAR | Status: DC
  Administered 2011-06-29 – 2011-07-01 (×5): 5000 [IU] via SUBCUTANEOUS
  Filled 2011-06-29 (×8): qty 1

## 2011-06-29 MED ORDER — ONDANSETRON HCL 4 MG/2ML IJ SOLN
INTRAMUSCULAR | Status: DC | PRN
  Administered 2011-06-29: 4 mg via INTRAVENOUS

## 2011-06-29 MED ORDER — GLYCOPYRROLATE 0.2 MG/ML IJ SOLN
INTRAMUSCULAR | Status: DC | PRN
  Administered 2011-06-29: .6 mg via INTRAVENOUS

## 2011-06-29 MED ORDER — FENTANYL CITRATE 0.05 MG/ML IJ SOLN
INTRAMUSCULAR | Status: DC | PRN
  Administered 2011-06-29: 75 ug via INTRAVENOUS
  Administered 2011-06-29: 25 ug via INTRAVENOUS

## 2011-06-29 MED ORDER — LIDOCAINE HCL 4 % IJ SOLN
INTRAMUSCULAR | Status: DC | PRN
  Administered 2011-06-29: 5 mL

## 2011-06-29 MED ORDER — LIDOCAINE HCL (CARDIAC) 20 MG/ML IV SOLN
INTRAVENOUS | Status: DC | PRN
  Administered 2011-06-29: 100 mg via INTRAVENOUS

## 2011-06-29 MED ORDER — MORPHINE SULFATE 2 MG/ML IJ SOLN
0.5000 mg | INTRAMUSCULAR | Status: DC | PRN
Start: 2011-06-29 — End: 2011-07-01

## 2011-06-29 MED ORDER — PANTOPRAZOLE SODIUM 40 MG IV SOLR
40.0000 mg | Freq: Every day | INTRAVENOUS | Status: DC
  Administered 2011-06-29 – 2011-06-30 (×2): 40 mg via INTRAVENOUS
  Filled 2011-06-29 (×3): qty 40

## 2011-06-29 MED ORDER — FENTANYL CITRATE 0.05 MG/ML IJ SOLN
50.0000 ug | INTRAMUSCULAR | Status: DC | PRN
  Administered 2011-06-29: 25 ug via INTRAVENOUS
  Administered 2011-06-29: 50 ug via INTRAVENOUS
  Administered 2011-06-29: 25 ug via INTRAVENOUS

## 2011-06-29 MED ORDER — ACETAMINOPHEN 650 MG RE SUPP: 650.0000 mg | Freq: Four times a day (QID) | RECTAL | Status: DC | PRN

## 2011-06-29 MED ORDER — ACETAMINOPHEN 325 MG PO TABS: 650.0000 mg | ORAL_TABLET | Freq: Four times a day (QID) | ORAL | Status: DC | PRN

## 2011-06-29 MED ORDER — OXYCODONE-ACETAMINOPHEN 5-325 MG/5ML PO SOLN: 5.0000 mL | ORAL | Status: AC | PRN

## 2011-06-29 MED ORDER — TRAMADOL-ACETAMINOPHEN 37.5-325 MG PO TABS: 2.0000 | ORAL_TABLET | Freq: Four times a day (QID) | ORAL | Status: DC | PRN

## 2011-06-29 SURGICAL SUPPLY — 50 items
APPLIER CLIP 9.375 SM OPEN (CLIP) ×2
BANDAGE ELASTIC 6 VELCRO ST LF (GAUZE/BANDAGES/DRESSINGS) IMPLANT
BENZOIN TINCTURE PRP APPL 2/3 (GAUZE/BANDAGES/DRESSINGS) ×2 IMPLANT
BINDER BREAST LRG (GAUZE/BANDAGES/DRESSINGS) IMPLANT
BINDER BREAST XLRG (GAUZE/BANDAGES/DRESSINGS) IMPLANT
BLADE SURG 10 STRL SS (BLADE) ×2 IMPLANT
BLADE SURG 15 STRL LF DISP TIS (BLADE) ×1 IMPLANT
BLADE SURG 15 STRL SS (BLADE) ×1
CANISTER SUCTION 2500CC (MISCELLANEOUS) IMPLANT
CLEANER TIP ELECTROSURG 2X2 (MISCELLANEOUS) ×2 IMPLANT
CLIP APPLIE 9.375 SM OPEN (CLIP) ×1 IMPLANT
CLOTH BEACON ORANGE TIMEOUT ST (SAFETY) ×2 IMPLANT
CONT SPEC 4OZ CLIKSEAL STRL BL (MISCELLANEOUS) IMPLANT
COVER PROBE W GEL 5X96 (DRAPES) ×2 IMPLANT
COVER SURGICAL LIGHT HANDLE (MISCELLANEOUS) ×2 IMPLANT
DECANTER SPIKE VIAL GLASS SM (MISCELLANEOUS) IMPLANT
DEVICE DUBIN SPECIMEN MAMMOGRA (MISCELLANEOUS) IMPLANT
DRAPE PED LAPAROTOMY (DRAPES) ×2 IMPLANT
DRSG PAD ABDOMINAL 8X10 ST (GAUZE/BANDAGES/DRESSINGS) IMPLANT
ELECT REM PT RETURN 9FT ADLT (ELECTROSURGICAL) ×2
ELECTRODE REM PT RTRN 9FT ADLT (ELECTROSURGICAL) ×1 IMPLANT
GAUZE SPONGE 4X4 16PLY XRAY LF (GAUZE/BANDAGES/DRESSINGS) ×4 IMPLANT
GLOVE BIO SURGEON STRL SZ8 (GLOVE) ×2 IMPLANT
GLOVE BIOGEL PI IND STRL 6.5 (GLOVE) ×1 IMPLANT
GLOVE BIOGEL PI INDICATOR 6.5 (GLOVE) ×1
GLOVE ECLIPSE 6.5 STRL STRAW (GLOVE) ×2 IMPLANT
GOWN PREVENTION PLUS XXLARGE (GOWN DISPOSABLE) ×4 IMPLANT
GOWN STRL NON-REIN LRG LVL3 (GOWN DISPOSABLE) ×2 IMPLANT
KIT BASIN OR (CUSTOM PROCEDURE TRAY) ×2 IMPLANT
KIT ROOM TURNOVER OR (KITS) ×2 IMPLANT
NEEDLE 18GX1X1/2 (RX/OR ONLY) (NEEDLE) ×2 IMPLANT
NEEDLE HYPO 25GX1X1/2 BEV (NEEDLE) ×2 IMPLANT
NS IRRIG 1000ML POUR BTL (IV SOLUTION) ×2 IMPLANT
PACK SURGICAL SETUP 50X90 (CUSTOM PROCEDURE TRAY) ×2 IMPLANT
PAD ARMBOARD 7.5X6 YLW CONV (MISCELLANEOUS) ×4 IMPLANT
PENCIL BUTTON HOLSTER BLD 10FT (ELECTRODE) ×2 IMPLANT
SPONGE GAUZE 4X4 12PLY (GAUZE/BANDAGES/DRESSINGS) ×2 IMPLANT
SPONGE LAP 4X18 X RAY DECT (DISPOSABLE) ×2 IMPLANT
STRIP CLOSURE SKIN 1/2X4 (GAUZE/BANDAGES/DRESSINGS) ×2 IMPLANT
SUT SILK 2 0 FS (SUTURE) IMPLANT
SUT VIC AB 3-0 54X BRD REEL (SUTURE) ×1 IMPLANT
SUT VIC AB 3-0 BRD 54 (SUTURE) ×1
SUT VIC AB 4-0 SH 18 (SUTURE) ×4 IMPLANT
SYR BULB IRRIGATION 50ML (SYRINGE) ×2 IMPLANT
SYR CONTROL 10ML LL (SYRINGE) ×2 IMPLANT
TOWEL OR 17X24 6PK STRL BLUE (TOWEL DISPOSABLE) ×2 IMPLANT
TOWEL OR 17X26 10 PK STRL BLUE (TOWEL DISPOSABLE) ×2 IMPLANT
TUBE CONNECTING 12X1/4 (SUCTIONS) IMPLANT
WATER STERILE IRR 1000ML POUR (IV SOLUTION) ×2 IMPLANT
YANKAUER SUCT BULB TIP NO VENT (SUCTIONS) IMPLANT

## 2011-06-29 NOTE — Anesthesia Procedure Notes (Signed)
Procedure Name: Intubation Date/Time: 06/29/2011 11:13 AM Performed by: Angelique Holm Pre-anesthesia Checklist: Patient identified, Timeout performed, Emergency Drugs available, Suction available and Patient being monitored Patient Re-evaluated:Patient Re-evaluated prior to inductionOxygen Delivery Method: Circle System Utilized Preoxygenation: Pre-oxygenation with 100% oxygen Intubation Type: IV induction Ventilation: Mask ventilation without difficulty and Oral airway inserted - appropriate to patient size Laryngoscope Size: Mac and 4 Grade View: Grade I Tube type: Oral Tube size: 7.0 mm Number of attempts: 1 Airway Equipment and Method: stylet Placement Confirmation: ETT inserted through vocal cords under direct vision,  positive ETCO2 and breath sounds checked- equal and bilateral Secured at: 22 cm Tube secured with: Tape Dental Injury: Teeth and Oropharynx as per pre-operative assessment

## 2011-06-29 NOTE — Progress Notes (Signed)
Pt now feeling nauseated and bp beginning to run slighly low pt medicated with zofran for nausea. Dr Hassell Done called regarding status and requested bed for observation til pt feeling better

## 2011-06-29 NOTE — Transfer of Care (Signed)
Immediate Anesthesia Transfer of Care Note  Patient: [name]  Procedure(s) Performed:  BREAST LUMPECTOMY WITH EXCISION OF SENTINEL NODE - right sentinel node mapping,right sentinel node biopsy, needle localization right breast lumpectomy  Patient Location: PACU  Anesthesia Type: General  Level of Consciousness: awake, alert  and oriented  Airway & Oxygen Therapy: Patient Spontanous Breathing and Patient connected to nasal cannula oxygen  Post-op Assessment: Report given to PACU RN and Post -op Vital signs reviewed and stable  Post vital signs: Reviewed and stable Filed Vitals:   06/29/11 0836  BP: 169/77  Pulse: 82  Temp: 36.5 C  Resp: 18    Complications: No apparent anesthesia complications

## 2011-06-29 NOTE — Anesthesia Postprocedure Evaluation (Signed)
  Anesthesia Post-op Note  Patient: [name]  Procedure(s) Performed:  BREAST LUMPECTOMY WITH EXCISION OF SENTINEL NODE - right sentinel node mapping,right sentinel node biopsy, needle localization right breast lumpectomy  Patient Location: PACU  Anesthesia Type: General  Level of Consciousness: awake, alert , oriented and patient cooperative  Airway and Oxygen Therapy: Patient Spontanous Breathing and Patient connected to nasal cannula oxygen  Post-op Pain: mild  Post-op Assessment: Post-op Vital signs reviewed, Patient's Cardiovascular Status Stable, Respiratory Function Stable, Patent Airway, No signs of Nausea or vomiting and Pain level controlled  Post-op Vital Signs: stable  Complications: No apparent anesthesia complications

## 2011-06-29 NOTE — Progress Notes (Signed)
Pt feeling dizzy when getting oob to w/c to go to short stay pt put back on stretcher.vss Sats 98% on 2lnc hrt rate 80's bp 149/68 will cont to assess for dc home.

## 2011-06-29 NOTE — Op Note (Signed)
Surgeon: Kaylyn Lim, MD, FACS  Asst:  none  Anes:  general  Procedure: Right sentinel lymph node mapping, sentinel lymph node biopsy, and needle localized right breast lumpectomy  Diagnosis: Lobular carcinoma of the right upper outer quadrant  Complications: none  EBL:   40 cc  Description of Procedure:  The patient had wire localization and nuclear medicine injection and was seen in the holding area where the right breast was marked.  She was taken back to OR 7 and given general and the right axillary region was mapped and marked and blue dye was injected behind the nipple.  The right axillary incision was used to explore the hot area and a blue hot node and adjacent less hot and not blue node was found.  Clips were applied as I removed this mass and the residual counts were low.    The wire exit site was reported to be 7 cm from the skin.  A transverse incision or radial incision was made at the 9 o clock position and the wire was extricated into the more medial incision.  It was then grasped and used to guide a large excision of mainly fatty breast tissue.  The palpable mass was treated as likely tumor and I resected beyond the wire.  The mass was excised and specimen mammography revealed the clip.  Hemostasis was achieved with 4-0 vicryl and multiple irrigations .  Both wounds were closed with 4-0 vicryl and steri strips.  A breast binder was placed.  The patient was taken to the PACU with plans for discharge.    [name], [place], [facility], [place]

## 2011-06-29 NOTE — Anesthesia Preprocedure Evaluation (Signed)
Anesthesia Evaluation  Patient identified by MRN, date of birth, ID band Patient awake    Reviewed: Allergy & Precautions, H&P , NPO status , Patient's Chart, lab work & pertinent test results  History of Anesthesia Complications (+) AWARENESS UNDER ANESTHESIA  Airway Mallampati: II TM Distance: <3 FB Neck ROM: full    Dental   Pulmonary shortness of breath,    + wheezing      Cardiovascular hypertension, + dysrhythmias irregular Normal    Neuro/Psych  Neuromuscular disease CVA, Residual Symptoms Negative Psych ROS   GI/Hepatic hiatal hernia, GERD-  ,  Endo/Other  Morbid obesity  Renal/GU      Musculoskeletal   Abdominal   Peds  Hematology   Anesthesia Other Findings   Reproductive/Obstetrics                           Anesthesia Physical Anesthesia Plan  ASA: III  Anesthesia Plan: General ETT   Post-op Pain Management:    Induction: Intravenous  Airway Management Planned: Oral ETT  Additional Equipment:   Intra-op Plan:   Post-operative Plan: Extubation in OR  Informed Consent: I have reviewed the patients History and Physical, chart, labs and discussed the procedure including the risks, benefits and alternatives for the proposed anesthesia with the patient or authorized representative who has indicated his/her understanding and acceptance.     Plan Discussed with: Anesthesiologist, CRNA and Surgeon  Anesthesia Plan Comments:         Anesthesia Quick Evaluation

## 2011-06-29 NOTE — H&P (Signed)
Chief Complaint: right breast cancer  History of Present Illness: [name] is an 78 y.o. female Patient well known to me going back to my treating her for a perforated colon from colonoscopy back in February 1995. She subsequently had 2 mile a lipomatous excised from her sacral region. She had a suspicious area found in the right breast on mammography with an area that was seen about 1.5 cm in greatest diameter 7 cm from the nipple at the 10:00 position that was ultrasound-guided core biopsy shown to contain invasive lobular carcinoma. Ultrasound axilla did not show any evidence of lymphadenopathy. An MRI is scheduled for January 4.  She received this news this afternoon in the breast center and she was able to come and see me on the same day. She was examined and we discussed options including mastectomy but what I would recommend as our first line as a lumpectomy with sentinel lymph node biopsy. This is a lobular cancer and wanted to wait and see what the MRI demonstrates. In the meantime on ago and fell out preoperative orders for this procedure can day surgery.wa very good job in most often here  Past Medical History   Diagnosis  Date   .  Hypertension    .  Hyperlipidemia    .  Morbid obesity    .  Osteoarthritis    .  Gout    .  CVA (cerebral infarction)    .  IBS (irritable bowel syndrome)    .  Diverticulosis    .  Esophageal stricture    .  Colon polyps      hyperplastic   .  Anxiety    .  Hiatal hernia      4 cm   .  DVT (deep vein thrombosis) in pregnancy    .  Shingles  02/21/2011   .  Skin cancer    .  Breast cancer      right breast    Past Surgical History   Procedure  Date   .  Abdominal hysterectomy    .  Tonsillectomy    .  Bile duct stent placement    .  Pelvic bone tumor removal    .  Colonic perforation repair    .  Total hip arthroplasty      right   .  Replacement total knee bilateral    .  Joint replacement    .  Colon surgery    .  Cholecystectomy   03/23/11     lap chole    Current Outpatient Prescriptions   Medication  Sig  Dispense  Refill   .  allopurinol (ZYLOPRIM) 100 MG tablet  Take 100 mg by mouth daily. Take 2 tablets every other day and 1 tablet every other day     .  amLODipine (NORVASC) 5 MG tablet  Take 5 mg by mouth daily.     Marland Kitchen  aspirin 325 MG tablet  Take 325 mg by mouth daily.     .  furosemide (LASIX) 40 MG tablet  Take 40 mg by mouth daily.     Marland Kitchen  lisinopril (PRINIVIL,ZESTRIL) 20 MG tablet  Take 40 mg by mouth daily.      Atorvastatin; Codeine; Oxycodone-acetaminophen; Rosuvastatin; Simvastatin; and Sulfonamide derivatives  Family History   Problem  Relation  Age of Onset   .  Breast cancer  Sister       x 2    .  Cancer  Sister       breast    .  Prostate cancer  Brother    .  Colon cancer  Neg Hx    .  Stroke  Mother    .  Kidney failure  Sister     Social History: reports that she has never smoked. She has never used smokeless tobacco. She reports that she does not drink alcohol or use illicit drugs.  REVIEW OF SYSTEMS - PERTINENT POSITIVES ONLY:  Noncontributory  Physical Exam:  Blood pressure 162/94, pulse 88, temperature 97.9 F (36.6 C), temperature source Temporal, resp. rate 24, height 5\' 6"  (1.676 m), weight 247 lb 6.4 oz (112.22 kg).  Body mass index is 39.93 kg/(m^2).  Gen: WDWN white female NAD  Neurological: Alert and oriented to person, place, and time. Motor and sensory function is grossly intact  Head: Normocephalic and atraumatic.  Eyes: Conjunctivae are normal. Pupils are equal, round, and reactive to light. No scleral icterus.  Neck: Normal range of motion. Neck supple. No tracheal deviation or thyromegaly present.  Cardiovascular: SR without murmurs or gallops. No carotid bruits  Breast: Ptotic breasts with no palpable mass on the right where there is a bruise. No axillary adenopathy  Respiratory: Effort normal. No respiratory distress. No chest wall tenderness. Breath sounds  normal. No wheezes, rales or rhonchi.  Abdomen: nontender  GU:  Musculoskeletal: . Extremities are nontender. No cyanosis, edema or clubbing noted Lymphadenopathy: No cervical, preauricular, postauricular or axillary adenopathy is present Skin: Skin is warm and dry. No rash noted. No diaphoresis. No erythema. No pallor. Pscyh: Normal mood and affect. Behavior is normal. Judgment and thought content normal.  LABORATORY RESULTS:  No results found for this or any previous visit (from the past 48 hour(s)).  RADIOLOGY RESULTS:  US Breast Right  06/09/2011 *RADIOLOGY REPORT* Clinical Data: The patient returns after screening study for evaluation of the right breast. The patient has two sisters diagnosed with breast cancer, at ages 67 and 28. DIGITAL DIAGNOSTIC RIGHT MAMMOGRAM AND RIGHT BREAST ULTRASOUND: Comparison: 05/23/2011 and earlier Findings: Spot compression views are performed of the right breast, confirming presence of distortion and density in the upper- outer quadrant. On physical exam, I palpate no abnormality within the upper outer quadrant of the right breast. There is slight right nipple retraction. Ultrasound is performed, showing in the area of acoustic shadowing in the 10 o'clock location of the right breast 7 cm from the nipple, measuring approximately 1.3 x 1.1 x 1.5 cm. Evaluation of the right axilla is unremarkable. IMPRESSION: Persistent distortion seen mammographically corresponds to an area of acoustic shadowing in the 10 o'clock location of the right breast by ultrasound. Ultrasound-guided core biopsy is recommended because of concern for malignancy, possibly invasive lobular carcinoma. The patient requests biopsy on the same day. This is performed and dictated separately. BI-RADS CATEGORY 4: Suspicious abnormality - biopsy should be considered. Original Report Authenticated By: Glenice Bow, M.D.  [place] Core Biopsy  06/09/2011 *RADIOLOGY REPORT* Clinical Data: Area of distortion,  acoustic shadowing in the 10 o'clock location of the right breast. ULTRASOUND GUIDED VACUUM ASSISTED CORE BIOPSY OF THE RIGHT BREAST Using sterile technique, 2% lidocaine, ultrasound guidance, and a 12 gauge vacuum assisted needle, biopsy was performed of area of acoustic shadowing in the 10 o'clock location of the right breast, using a lateral approach. At the conclusion of the procedure, a tissue marker clip was deployed into the biopsy cavity. Follow-up 2-view mammogram was performed and dictated separately. IMPRESSION:  Ultrasound-guided biopsy of right breast. No apparent complications. Original Report Authenticated By: Glenice Bow, M.D.  [place] R  06/09/2011 *RADIOLOGY REPORT* Clinical Data: The patient returns after screening study for evaluation of the right breast. The patient has two sisters diagnosed with breast cancer, at ages 33 and 10. DIGITAL DIAGNOSTIC RIGHT MAMMOGRAM AND RIGHT BREAST ULTRASOUND: Comparison: 05/23/2011 and earlier Findings: Spot compression views are performed of the right breast, confirming presence of distortion and density in the upper- outer quadrant. On physical exam, I palpate no abnormality within the upper outer quadrant of the right breast. There is slight right nipple retraction. Ultrasound is performed, showing in the area of acoustic shadowing in the 10 o'clock location of the right breast 7 cm from the nipple, measuring approximately 1.3 x 1.1 x 1.5 cm. Evaluation of the right axilla is unremarkable. IMPRESSION: Persistent distortion seen mammographically corresponds to an area of acoustic shadowing in the 10 o'clock location of the right breast by ultrasound. Ultrasound-guided core biopsy is recommended because of concern for malignancy, possibly invasive lobular carcinoma. The patient requests biopsy on the same day. This is performed and dictated separately. BI-RADS CATEGORY 4: Suspicious abnormality - biopsy should be considered. Original Report  Authenticated By: Glenice Bow, M.D.  Mm Digital Diagnostic Unil[place] R  06/09/2011 *RADIOLOGY REPORT* Clinical Data: Status post ultrasound guided core biopsy of area of shadowing in the 10 o'clock location of the right breast. DIGITAL DIAGNOSTIC RIGHT MAMMOGRAM Comparison: 06/09/2011, 05/23/2011 and earlier Findings: Films are performed following ultrasound guided biopsy of area of shadowing in the 10 o'clock location of the right breast. A ribbon shaped clip is identified in the upper-outer quadrant of the right breast, in the area of distortion seen mammographically. IMPRESSION: Tissue marker clip is in expected location after biopsy. Original Report Authenticated By: Glenice Bow, M.D.  Mm Radiologist Eval And Mgmt  06/10/2011 *RADIOLOGY REPORT* ESTABLISHED PATIENT OFFICE VISIT - LEVEL II [phone]) Chief Complaint: The patient presents for follow-up after ultrasound guided core biopsy. Pathology shows invasive ductal carcinoma, correlating well with the imaging appearance. History: New area of distortion and shadowing in the upper-outer quadrant of the right breast. Exam: Right breast is clean and dry. There is an area of ecchymosis in the lateral portion of the right breast. There is no palpable hematoma. Assessment and Plan: The patient will see Dr. Hassell Done for surgical consultation today. MRI has been scheduled 06/17/2011. The patient was given Scientist, clinical (histocompatibility and immunogenetics). The plan was discussed and questions were answered. Original Report Authenticated By: Glenice Bow, M.D.   Problem List:  Patient Active Problem List   Diagnoses   .  POLYP, COLON   .  DIABETES MELLITUS, TYPE II   .  HYPERLIPIDEMIA   .  GOUT   .  ANXIETY   .  PERIPHERAL NEUROPATHY   .  HYPERTENSION   .  CVA   .  DIVERTICULOSIS, COLON   .  IRRITABLE BOWEL, PREDOMINANTLY CONSTIPATION   .  FATIGUE   .  COLONIC POLYPS, HX OF   .  TREMOR   .  Bile duct calculus with nonacute cholecystitis   .  Morbid obesity   .   Preventative health care   .  Shingles    Assessment & Plan:  Right invasive lobular carcinoma of the breast. Plan preliminary schedule for lumpectomy with sentinel lymph node biopsy on the right.  [name], MD, [facility]  [phone] beeper  [phone] I have discussed need for needle loc and sentinel node biopsy with Mrs. Reeves.  Will proceed after needle loc and nuc med injection

## 2011-06-29 NOTE — Preoperative (Signed)
Beta Blockers   Reason not to administer Beta Blockers:Not Applicable 

## 2011-06-29 NOTE — Plan of Care (Signed)
Problem: Diagnosis - Type of Surgery Goal: General Surgical Patient Education (See Patient Education module for education specifics) Left lumpectomy with sentinel node biopsy

## 2011-06-29 NOTE — Progress Notes (Signed)
Dr Conrad [place] called regarding low bp  Iv bolus ns given per order

## 2011-06-30 ENCOUNTER — Encounter (HOSPITAL_COMMUNITY): Payer: Self-pay | Admitting: Surgery

## 2011-06-30 DIAGNOSIS — C50911 Malignant neoplasm of unspecified site of right female breast: Secondary | ICD-10-CM | POA: Insufficient documentation

## 2011-06-30 MED ORDER — TRAMADOL-ACETAMINOPHEN 37.5-325 MG PO TABS
1.0000 | ORAL_TABLET | Freq: Four times a day (QID) | ORAL | Status: DC | PRN
Start: 2011-06-30 — End: 2011-07-01
  Administered 2011-06-30: 1 via ORAL
  Filled 2011-06-30: qty 1

## 2011-06-30 MED ORDER — SODIUM CHLORIDE 0.9 % IV BOLUS (SEPSIS)
250.0000 mL | Freq: Once | INTRAVENOUS | Status: AC
  Administered 2011-06-30: 250 mL via INTRAVENOUS

## 2011-06-30 MED ORDER — OXYCODONE HCL 5 MG PO TABS
5.0000 mg | ORAL_TABLET | ORAL | Status: DC | PRN
  Filled 2011-06-30: qty 1

## 2011-06-30 MED ORDER — FUROSEMIDE 40 MG PO TABS
40.0000 mg | ORAL_TABLET | Freq: Every day | ORAL | Status: DC
Start: 2011-06-30 — End: 2011-07-01
  Administered 2011-06-30 – 2011-07-01 (×3): 40 mg via ORAL
  Filled 2011-06-30 (×2): qty 1

## 2011-06-30 NOTE — Progress Notes (Signed)
Patient ID: [name], female   DOB: 30-Jul-1929, 78 y.o.   MRN: [id number] [facility] Surgery Progress Note:   1 Day Post-Op  Subjective: Had postop dizziness leading to her overnight stay.   Objective: Vital signs in last 24 hours: Temp:  [97.1 F (36.2 C)-98 F (36.7 C)] 97.1 F (36.2 C) (01/17 0554) Pulse Rate:  [74-111] 75  (01/17 0554) Resp:  [5-33] 16  (01/17 0554) BP: (107-169)/(42-77) 109/45 mmHg (01/17 0554) SpO2:  [88 %-98 %] 95 % (01/17 0554)  Intake/Output from previous day: 01/16 0701 - 01/17 0700 In: 2444 [I.V.:2444] Out: 350 [Urine:200; Blood:150] Intake/Output this shift:    Physical Exam:  Little to no pain in lumpectomy site.  Breast binder in place Lab Results:   Basename 06/29/11 1757 06/27/11 0913  WBC 17.4* 7.8  HGB 11.3* 14.8  HCT 36.2 46.9*  PLT 243 203   BMET  Basename 06/29/11 1757 06/27/11 0913  NA -- 144  K -- 4.2  CL -- 103  CO2 -- 34*  GLUCOSE -- 140*  BUN -- 28*  CREATININE 1.53* 1.17*  CALCIUM -- 9.8   PT/INR No results found for this basename: LABPROT:2,INR:2 in the last 72 hours Studies/Results: Nm Sentinel Node Inj-no Rpt (breast)  06/29/2011  CLINICAL DATA: Breast Cancer   Sulfur colloid was injected intradermally by the nuclear medicine  technologist for breast cancer sentinel node localization.     Mm Breast Surgical Specimen  06/29/2011  *RADIOLOGY REPORT*  Clinical Data:  78 year old female with right breast cancer - wire localization prior to right lumpectomy.  NEEDLE LOCALIZATION WITH MAMMOGRAPHIC GUIDANCE AND SPECIMEN RADIOGRAPH  Patient presents for needle localization prior to right lumpectomy. I met with the patient and we discussed the procedure of needle localization including benefits and alternatives. We discussed the high likelihood of a successful procedure. We discussed the risks of the procedure, including infection, bleeding, tissue injury, and further surgery. Informed, written consent was given.  Using  mammographic guidance, sterile technique, 2% lidocaine and a 9 cm modified Kopans needle, the biopsy clip was localized using a lateral approach.  The films are marked for Dr. Hassell Done.  Specimen radiograph was performed at day surgery, and confirms the biopsy clip and wire present in the tissue sample.  The specimen is marked for pathology.  IMPRESSION: Needle localization right breast.  No apparent complications.  Original Report Authenticated By: Lura Em, M.D.   Mm Breast Wire Localization Right  06/29/2011  *RADIOLOGY REPORT*  Clinical Data:  78 year old female with right breast cancer - wire localization prior to right lumpectomy.  NEEDLE LOCALIZATION WITH MAMMOGRAPHIC GUIDANCE AND SPECIMEN RADIOGRAPH  Patient presents for needle localization prior to right lumpectomy. I met with the patient and we discussed the procedure of needle localization including benefits and alternatives. We discussed the high likelihood of a successful procedure. We discussed the risks of the procedure, including infection, bleeding, tissue injury, and further surgery. Informed, written consent was given.  Using mammographic guidance, sterile technique, 2% lidocaine and a 9 cm modified Kopans needle, the biopsy clip was localized using a lateral approach.  The films are marked for Dr. Hassell Done.  Specimen radiograph was performed at day surgery, and confirms the biopsy clip and wire present in the tissue sample.  The specimen is marked for pathology.  IMPRESSION: Needle localization right breast.  No apparent complications.  Original Report Authenticated By: Lura Em, M.D.   Anti-infectives: Anti-infectives     Start  Dose/Rate Route Frequency Ordered Stop   06/29/11 0915   ceFAZolin (ANCEF) IVPB 2 g/50 mL premix        2 g 100 mL/hr over 30 Minutes Intravenous  Once 06/29/11 0900 06/29/11 1125   06/29/11 0903   ceFAZolin (ANCEF) 2-3 GM-% IVPB SOLR     Comments: MICHAEL, CYNTHIA: cabinet override          06/29/11 [id number] 06/29/11 2114          Assessment/Plan: Problem List: Patient Active Problem List  Diagnoses  . POLYP, COLON  . DIABETES MELLITUS, TYPE II  . HYPERLIPIDEMIA  . GOUT  . ANXIETY  . PERIPHERAL NEUROPATHY  . HYPERTENSION  . CVA  . DIVERTICULOSIS, COLON  . IRRITABLE BOWEL, PREDOMINANTLY CONSTIPATION  . FATIGUE  . COLONIC POLYPS, HX OF  . TREMOR  . Bile duct calculus with nonacute cholecystitis  . Morbid obesity  . Preventative health care  . Shingles  . Breast cancer, right breast-lobular s/p lumpectomy and sentinel node biopsy    No pain.  Dizziness is much better.  Wants to go home 1 Day Post-Op    LOS: 1 day   [name], MD, [facility] [phone] beeper [phone]  06/30/2011 8:21 AM

## 2011-06-30 NOTE — Discharge Summary (Signed)
Physician Discharge Summary  Patient ID: [name] MRN: [id number] DOB/AGE: 09/03/1929 78 y.o.  Admit date: 06/29/2011 Discharge date: 06/30/2011  Admission Diagnoses:  Discharge Diagnoses:  Active Problems:  * No active hospital problems. *    Discharged Condition: fair  Hospital Course: Had lumpectomy and sentinel node biopsy.  Was dizzy postop and was kept in overnight  Consults: none  Significant Diagnostic Studies: none  Treatments: IV hydration  Discharge Exam: Blood pressure 109/45, pulse 75, temperature 97.1 F (36.2 C), temperature source Oral, resp. rate 16, SpO2 95.00%. no pain in incision  Disposition: Home or Self Care  Discharge Orders    Future Appointments: Provider: Department: Dept Phone: Center:   08/16/2011 1:30 PM [name], MD [facility] [phone] [facility]     Future Orders Please Complete By Expires   Diet - low sodium heart healthy      Diet - low sodium heart healthy      Increase activity slowly      Leave dressing on - Keep it clean, dry, and intact until clinic visit      Increase activity slowly      Discharge wound care:      Comments:   Remove breast binder tomorrow and remove dressings and shower.  Would reapply breast binder if it helps area to feel better.     Medication List  As of 06/30/2011  8:27 AM   TAKE these medications         allopurinol 100 MG tablet   Commonly known as: ZYLOPRIM   Take 100 mg by mouth daily. Take 2 tablets every other day and 1 tablet every other day      amLODipine 5 MG tablet   Commonly known as: NORVASC   Take 5 mg by mouth daily.      aspirin 325 MG tablet   Take 325 mg by mouth daily.      furosemide 40 MG tablet   Commonly known as: LASIX   Take 40 mg by mouth daily.      LIQUID TEARS OP   Place 1 drop into both eyes 2 (two) times daily as needed. For dry eyes      lisinopril 40 MG tablet   Commonly known as: PRINIVIL,ZESTRIL   Take 40 mg by mouth daily.     oxyCODONE-acetaminophen 5-325 MG/5ML solution   Commonly known as: ROXICET   Take 5 mLs by mouth every 4 (four) hours as needed for pain.           Follow-up Information    Follow up with Johnathan Hausen B, MD. Schedule an appointment as soon as possible for a visit in 7 days.   Contact information:   [employer], [place], [place] [phone]          Signed: Pedro Earls 06/30/2011, 8:27 AM

## 2011-07-01 LAB — BASIC METABOLIC PANEL
BUN: 36 mg/dL — ABNORMAL HIGH (ref 6–23)
CO2: 24 mEq/L (ref 19–32)
Calcium: 8 mg/dL — ABNORMAL LOW (ref 8.4–10.5)
Chloride: 106 mEq/L (ref 96–112)
Creatinine, Ser: 1.88 mg/dL — ABNORMAL HIGH (ref 0.50–1.10)
GFR calc Af Amer: 28 mL/min — ABNORMAL LOW (ref >90)
GFR calc non Af Amer: 24 mL/min — ABNORMAL LOW (ref >90)
Glucose, Bld: 145 mg/dL — ABNORMAL HIGH (ref 70–99)
Potassium: 5.6 mEq/L — ABNORMAL HIGH (ref 3.5–5.1)
Sodium: 137 mEq/L (ref 135–145)

## 2011-07-01 LAB — CBC
HCT: 26.7 % — ABNORMAL LOW (ref 36.0–46.0)
Hemoglobin: 8.3 g/dL — ABNORMAL LOW (ref 12.0–15.0)
MCH: 29.5 pg (ref 26.0–34.0)
MCHC: 31.1 g/dL (ref 30.0–36.0)
MCV: 95 fL (ref 78.0–100.0)
Platelets: 180 10*3/uL (ref 150–400)
RBC: 2.81 MIL/uL — ABNORMAL LOW (ref 3.87–5.11)
RDW: 16.1 % — ABNORMAL HIGH (ref 11.5–15.5)
WBC: 11 10*3/uL — ABNORMAL HIGH (ref 4.0–10.5)

## 2011-07-01 MED ORDER — PANTOPRAZOLE SODIUM 40 MG PO TBEC
40.0000 mg | DELAYED_RELEASE_TABLET | Freq: Every day | ORAL | Status: DC
  Administered 2011-07-01: 40 mg via ORAL
  Filled 2011-07-01: qty 1

## 2011-07-01 NOTE — Progress Notes (Signed)
Patient ID: [name], female   DOB: 10/16/1929, 78 y.o.   MRN: [id number] [facility] Surgery Progress Note:   2 Days Post-Op  Subjective: Feeling better.  Foley in with some methylene blue in urine.  Lab that was ordered for yesterday and today was not completed. Reordered stat.  Will DC Foley.   Objective: Vital signs in last 24 hours: Temp:  [97.4 F (36.3 C)-98.1 F (36.7 C)] 97.8 F (36.6 C) (01/18 0535) Pulse Rate:  [81-93] 81  (01/18 0535) Resp:  [18-20] 18  (01/18 0535) BP: (110-133)/(38-48) 115/38 mmHg (01/18 0535) SpO2:  [92 %-96 %] 96 % (01/18 0535) Weight:  [252 lb 11.2 oz (114.624 kg)] 252 lb 11.2 oz (114.624 kg) (01/17 1900)  Intake/Output from previous day: 01/17 0701 - 01/18 0700 In: 2537 [I.V.:2287; IV Piggyback:250] Out: 550 [Urine:550] Intake/Output this shift:    Physical Exam:  Right breast swollen as expected with large cavity from biopsy and SLNBx.   Lab Results:   [facility] 06/29/11 1757  WBC 17.4*  HGB 11.3*  HCT 36.2  PLT 243   BMET  Basename 06/29/11 1757  NA --  K --  CL --  CO2 --  GLUCOSE --  BUN --  CREATININE 1.53*  CALCIUM --   PT/INR No results found for this basename: LABPROT:2,INR:2 in the last 72 hours Studies/Results: Nm Sentinel Node Inj-no Rpt (breast)  06/29/2011  CLINICAL DATA: Breast Cancer   Sulfur colloid was injected intradermally by the nuclear medicine  technologist for breast cancer sentinel node localization.     Anti-infectives: Anti-infectives     Start     Dose/Rate Route Frequency Ordered Stop   06/29/11 0915   ceFAZolin (ANCEF) IVPB 2 g/50 mL premix        2 g 100 mL/hr over 30 Minutes Intravenous  Once 06/29/11 0900 06/29/11 1125   06/29/11 0903   ceFAZolin (ANCEF) 2-3 GM-% IVPB SOLR     Comments: MICHAEL, CYNTHIA: cabinet override         06/29/11 [id number] 06/29/11 2114          Assessment/Plan: Problem List: Patient Active Problem List  Diagnoses  . POLYP, COLON  . DIABETES MELLITUS,  TYPE II  . HYPERLIPIDEMIA  . GOUT  . ANXIETY  . PERIPHERAL NEUROPATHY  . HYPERTENSION  . CVA  . DIVERTICULOSIS, COLON  . IRRITABLE BOWEL, PREDOMINANTLY CONSTIPATION  . FATIGUE  . COLONIC POLYPS, HX OF  . TREMOR  . Bile duct calculus with nonacute cholecystitis  . Morbid obesity  . Preventative health care  . Shingles  . Breast cancer, right breast-lobular s/p lumpectomy and sentinel node biopsy    Path pending.  Hopeful discharge today.  Will get labs stat and assess for discharge 2 Days Post-Op    LOS: 2 days   [name], MD, [facility] [phone] beeper [phone]  07/01/2011 8:49 AM

## 2011-07-04 ENCOUNTER — Telehealth (INDEPENDENT_AMBULATORY_CARE_PROVIDER_SITE_OTHER): Payer: Self-pay | Admitting: General Surgery

## 2011-07-04 NOTE — Telephone Encounter (Signed)
Contacted the patient to see how she was feeling, Per Dr Carlye Grippe request. She said she is feeling fine and is having minimal drainage from her incision site, able to change her dressing once a day at the time she showers, pt has no fever and minimal discomfort. She would like to know the results of her pathology. I expressed we will call her when we receive it.

## 2011-07-05 ENCOUNTER — Telehealth (INDEPENDENT_AMBULATORY_CARE_PROVIDER_SITE_OTHER): Payer: Self-pay | Admitting: General Surgery

## 2011-07-05 NOTE — Telephone Encounter (Signed)
Contacted the patient per Dr Hassell Done and informed her of pathology.

## 2011-07-11 ENCOUNTER — Telehealth (INDEPENDENT_AMBULATORY_CARE_PROVIDER_SITE_OTHER): Payer: Self-pay

## 2011-07-11 NOTE — Telephone Encounter (Signed)
Pt called stating her husband changed her bandage today and noticed as the steri strips come off there appears to be an edge of the incision that overlaps a bit. No fever. Wound not opening. I offered office appt with urg md but pt declined and wants to keep appt on Thursday with Dr Hassell Done. Pt advised if wd opens or show signs of infection she needs to come in sooner.

## 2011-07-14 ENCOUNTER — Ambulatory Visit (INDEPENDENT_AMBULATORY_CARE_PROVIDER_SITE_OTHER): Payer: Medicare Other | Admitting: Surgery

## 2011-07-14 ENCOUNTER — Encounter (INDEPENDENT_AMBULATORY_CARE_PROVIDER_SITE_OTHER): Payer: Self-pay | Admitting: Surgery

## 2011-07-14 VITALS — BP 146/84 | HR 88 | Temp 97.8°F | Resp 24 | Ht 66.0 in | Wt 243.8 lb

## 2011-07-14 DIAGNOSIS — C50919 Malignant neoplasm of unspecified site of unspecified female breast: Secondary | ICD-10-CM

## 2011-07-14 DIAGNOSIS — C50911 Malignant neoplasm of unspecified site of right female breast: Secondary | ICD-10-CM

## 2011-07-14 MED ORDER — CEPHALEXIN 500 MG PO CAPS: 500.0000 mg | ORAL_CAPSULE | Freq: Two times a day (BID) | ORAL | Status: AC

## 2011-07-14 NOTE — Patient Instructions (Signed)
Shower often and apply bulky dressing with Neosporin ointment to collect seroma drainage

## 2011-07-14 NOTE — Progress Notes (Signed)
[name] comes in today after her right lumpectomy and sentinel lymph node biopsy. Her sentinel lymph node was negative. She had a very large biopsy cavity which became contiguous with her son the lymph node cavity. She developed a very large hematoma probably related to restarting her aspirin and the size of this cavity. I went ahead and opened it and massaged many cc of motoroil appearing fluid and some small clots. This will drain for several days.  I will place her on Keflex 500 mg b.i.d. For 5 days and I will see her back in 10 days in followup. We will then make her a postop appointment with radiation oncology and with medical oncology.

## 2011-07-20 ENCOUNTER — Encounter (INDEPENDENT_AMBULATORY_CARE_PROVIDER_SITE_OTHER): Payer: Self-pay | Admitting: Surgery

## 2011-07-20 ENCOUNTER — Ambulatory Visit (INDEPENDENT_AMBULATORY_CARE_PROVIDER_SITE_OTHER): Payer: Medicare Other | Admitting: Surgery

## 2011-07-20 VITALS — BP 140/74 | HR 66 | Temp 97.9°F | Resp 20 | Ht 66.0 in | Wt 247.0 lb

## 2011-07-20 DIAGNOSIS — Z09 Encounter for follow-up examination after completed treatment for conditions other than malignant neoplasm: Secondary | ICD-10-CM

## 2011-07-20 NOTE — Progress Notes (Signed)
Subjective:     Patient ID: [name], female   DOB: 1930-03-19, 78 y.o.   MRN: [id number]  HPI This is a patient of Dr. Earlie Server. She had a lumpectomy in January and developed a postoperative hematoma. She drained some of the hematoma last week. She was concern about increased swelling. She is currently on Keflex.  Review of Systems     Objective:   Physical Exam There is a large hematoma of the breast on the left side. I inserted a needle but was unable to drain any fluid.    Assessment:     Right breast postop hematoma    Plan:     I reassured her. The hematoma was liquefied more to be drained. She will see Dr. Hassell Done next week

## 2011-07-21 ENCOUNTER — Encounter (INDEPENDENT_AMBULATORY_CARE_PROVIDER_SITE_OTHER): Payer: Medicare Other | Admitting: General Surgery

## 2011-07-28 ENCOUNTER — Ambulatory Visit (INDEPENDENT_AMBULATORY_CARE_PROVIDER_SITE_OTHER): Payer: Medicare Other | Admitting: Surgery

## 2011-07-28 ENCOUNTER — Encounter (INDEPENDENT_AMBULATORY_CARE_PROVIDER_SITE_OTHER): Payer: Self-pay | Admitting: Surgery

## 2011-07-28 ENCOUNTER — Other Ambulatory Visit (INDEPENDENT_AMBULATORY_CARE_PROVIDER_SITE_OTHER): Payer: Self-pay | Admitting: General Surgery

## 2011-07-28 VITALS — BP 146/78 | HR 72 | Temp 97.2°F | Resp 18 | Ht 68.0 in | Wt 243.0 lb

## 2011-07-28 DIAGNOSIS — C50919 Malignant neoplasm of unspecified site of unspecified female breast: Secondary | ICD-10-CM

## 2011-07-28 DIAGNOSIS — C50911 Malignant neoplasm of unspecified site of right female breast: Secondary | ICD-10-CM

## 2011-07-28 NOTE — Progress Notes (Signed)
ADDITIONAL INFORMATION: 2. CHROMOGENIC IN-SITU HYBRIDIZATION Interpretation HER-2/NEU BY CISH - NO AMPLIFICATION OF HER-2 DETECTED. THE RATIO OF HER-2: CEP 17 SIGNALS WAS 1.37. Reference range: Ratio: HER2:CEP17 < 1.8 - gene amplification not observed Ratio: HER2:CEP 17 1.8-2.2 - equivocal result Ratio: HER2:CEP17 > 2.2 - gene amplification observed Enid Cutter MD Pathologist, Electronic Signature ( Signed 07/11/2011) FINAL DIAGNOSIS Diagnosis 1. Lymph node, sentinel, biopsy, right - ONE LYMPH NODE, NEGATIVE FOR CARCINOMA (0/1). 2. Breast, lumpectomy, right - INVASIVE DUCTAL CARCINOMA, NOTTINGHAM COMBINED HISTOLOGIC GRADE I, 2.8 CM. - NO EVIDENCE OF ANGIOLYMPHATIC INVASION IDENTIFIED. - RESECTION MARGINS ARE CLEAR. - PLEASE SEE ONCOLOGY TEMPLATE FOR DETAILS. Microscopic Comment 2. BREAST, INVASIVE TUMOR, WITH LYMPH NODE SAMPLING Specimen, including laterality: Right breast Procedure: Lumpectomy Grade: I 1 of 3 FINAL for [name], [name] [id number]) Microscopic Comment(continued) Tubule formation: 1 Nuclear pleomorphism: 2 Mitotic:1 Tumor size (gross measurement): 2.8 cm Margins:Negative Invasive, distance to closest margin: Inked margin: 0.2 cm In-situ, distance to closest margin: Inked margin: 0.2 cm Lymphovascular invasion: No Ductal carcinoma in situ: Present. Grade: Low grade Extensive intraductal component: No Lobular neoplasia: N/[name] Tumor focality: Unifocal Treatment effect: N/[name] Extent of tumor: Skin: N/[name] Nipple: N/[name] Skeletal muscle: N/[name] Lymph nodes: # examined: 1 Lymph nodes with metastasis:0 Isolated tumor cells (< 0.2 mm): 0 Micrometastasis: (> 0.2 mm and < 2.0 mm): 0 Macrometastasis: (> 2.0 mm): 0 Extracapsular extension: 0 Breast prognostic profile: Please correlate with previous report [id number] Estrogen receptor: 100%, positive, strong staining intensity Progesterone receptor: 100% positive, strong staining intensity Her 2 neu: No amplification of  Her 2 detected. The ratio of Her 2:CEP 17 signals was 1.2. Her 2 neu will be repeated and an addendum report will follow. Ki-67: 9% Non-neoplastic breast: Unremarkable TNM: pT2, pN0(sn-) Aldona Bar MD Pathologist, Electronic Signature  [name] has [name] slowly resorbing hematoma of the large lumpectomy and sentinel node cavity in her right breast. One over the path report with her and she asked me if she could possibly does take [name] for chemotherapy. She is strongly ER and PR positive  And I  will refer her now for medical oncology and radiation oncology with [name] to she saw for pelvic radiation many years ago.  I will follow this hematoma closely. I previously aspirated it but I think it could be something that may either spontaneously drain, resorb, or I may have to open it surgically. Plan return in 2 weeks

## 2011-07-29 ENCOUNTER — Telehealth: Payer: Self-pay | Admitting: *Deleted

## 2011-07-29 NOTE — Telephone Encounter (Signed)
Confirmed 08/01/11 appt w/ pt.  Unable to mail before appt letter & packet - gave verbal.

## 2011-08-01 ENCOUNTER — Telehealth: Payer: Self-pay | Admitting: Oncology

## 2011-08-01 ENCOUNTER — Ambulatory Visit (HOSPITAL_BASED_OUTPATIENT_CLINIC_OR_DEPARTMENT_OTHER): Payer: Medicare Other

## 2011-08-01 ENCOUNTER — Ambulatory Visit: Payer: Medicare Other | Admitting: Oncology

## 2011-08-01 ENCOUNTER — Other Ambulatory Visit: Payer: Self-pay | Admitting: *Deleted

## 2011-08-01 ENCOUNTER — Other Ambulatory Visit (HOSPITAL_BASED_OUTPATIENT_CLINIC_OR_DEPARTMENT_OTHER): Payer: Medicare Other

## 2011-08-01 ENCOUNTER — Encounter: Payer: Self-pay | Admitting: Genetic Counselor

## 2011-08-01 DIAGNOSIS — Z780 Asymptomatic menopausal state: Secondary | ICD-10-CM

## 2011-08-01 DIAGNOSIS — C50411 Malignant neoplasm of upper-outer quadrant of right female breast: Secondary | ICD-10-CM | POA: Insufficient documentation

## 2011-08-01 DIAGNOSIS — C50419 Malignant neoplasm of upper-outer quadrant of unspecified female breast: Secondary | ICD-10-CM

## 2011-08-01 DIAGNOSIS — R509 Fever, unspecified: Secondary | ICD-10-CM

## 2011-08-01 LAB — COMPREHENSIVE METABOLIC PANEL
ALT: 15 U/L (ref 0–35)
AST: 20 U/L (ref 0–37)
Albumin: 3.5 g/dL (ref 3.5–5.2)
Alkaline Phosphatase: 83 U/L (ref 39–117)
BUN: 30 mg/dL — ABNORMAL HIGH (ref 6–23)
CO2: 29 mEq/L (ref 19–32)
Calcium: 8.9 mg/dL (ref 8.4–10.5)
Chloride: 101 mEq/L (ref 96–112)
Creatinine, Ser: 1.41 mg/dL — ABNORMAL HIGH (ref 0.50–1.10)
Glucose, Bld: 146 mg/dL — ABNORMAL HIGH (ref 70–99)
Potassium: 4.5 mEq/L (ref 3.5–5.3)
Sodium: 140 mEq/L (ref 135–145)
Total Bilirubin: 1.1 mg/dL (ref 0.3–1.2)
Total Protein: 6.7 g/dL (ref 6.0–8.3)

## 2011-08-01 LAB — CBC WITH DIFFERENTIAL/PLATELET
BASO%: 0.2 % (ref 0.0–2.0)
Basophils Absolute: 0 10*3/uL (ref 0.0–0.1)
EOS%: 0.7 % (ref 0.0–7.0)
Eosinophils Absolute: 0.1 10*3/uL (ref 0.0–0.5)
HCT: 39.3 % (ref 34.8–46.6)
HGB: 12 g/dL (ref 11.6–15.9)
LYMPH%: 18.4 % (ref 14.0–49.7)
MCH: 28.5 pg (ref 25.1–34.0)
MCHC: 30.5 g/dL — ABNORMAL LOW (ref 31.5–36.0)
MCV: 93.3 fL (ref 79.5–101.0)
MONO#: 1.3 10*3/uL — ABNORMAL HIGH (ref 0.1–0.9)
MONO%: 12.2 % (ref 0.0–14.0)
NEUT#: 7.3 10*3/uL — ABNORMAL HIGH (ref 1.5–6.5)
NEUT%: 68.5 % (ref 38.4–76.8)
Platelets: 215 10*3/uL (ref 145–400)
RBC: 4.21 10*6/uL (ref 3.70–5.45)
RDW: 16.5 % — ABNORMAL HIGH (ref 11.2–14.5)
WBC: 10.6 10*3/uL — ABNORMAL HIGH (ref 3.9–10.3)
lymph#: 2 10*3/uL (ref 0.9–3.3)

## 2011-08-01 LAB — CANCER ANTIGEN 27.29: CA 27.29: 20 U/mL (ref 0–39)

## 2011-08-01 MED ORDER — CEPHALEXIN 500 MG PO CAPS: 500.0000 mg | ORAL_CAPSULE | Freq: Four times a day (QID) | ORAL | Status: AC

## 2011-08-01 MED ORDER — DEXTROSE 5 % IV SOLN
2.0000 g | INTRAVENOUS | Status: DC
  Administered 2011-08-01: 2 g via INTRAVENOUS
  Filled 2011-08-01: qty 2

## 2011-08-01 NOTE — Progress Notes (Signed)
Dr. Truddie Coco asked pt be seen for genetic counseling. Low risk. Recommended her sister who had BC in her 35s get BRCA testing if anyone is to be tested in the family.

## 2011-08-01 NOTE — Progress Notes (Signed)
Referral MD Dr Rockne Coons  Reason for Referral: Breast Cancer   Chief Complaint  Patient presents with  . Breast Cancer    is a pleasant 78 year old woman from [facility] referred by Dr. Hassell Done for evaluation and treatment of her recently diagnosed breast cancer. This patient is an annual screening mammography. She underwent screening mammography in December of 2012 and a followup right mammogram was recommended. Physical exam do not know any masses but the mammogram and ultrasound demonstrated a area of abnormality 7 cm from the nipple measuring 1.5 x 1.3 x 1.1 cm. Ultrasound of the axilla was negative. A biopsy of the mass performed on 06/09/2011 showed invasive ductal cancer HER-2 negative, ER-positive 100% PR +100% Ki-67 was low at 9%. MRI scans of the breasts on 06/17/2011 showed a mass measuring 2.8 x 1.7 x 1.3 cm no other abnormalities were seen. The patient underwent a lumpectomy with sentinel lymph node evaluation on 06/29/2011 pathology showed a 2.8 cm, grade 1 invasive ductal cancer, close surgical margins were noted. One sentinel lymph node identified and negative for malignancy. Closest surgical margin was 0.2 cm. The patient's postoperative course is been complicated by hematoma she has been on course of antibiotics.  HPI:  Past Medical History  Diagnosis Date  . Hypertension   . Hyperlipidemia   . Morbid obesity   . Osteoarthritis   . Gout   . CVA (cerebral infarction)   . IBS (irritable bowel syndrome)   . Diverticulosis   . Esophageal stricture   . Colon polyps     hyperplastic  . Anxiety   . Hiatal hernia     4 cm  . Shingles 02/21/2011  . Skin cancer   . Breast cancer     right breast  . Dysrhythmia     dr bensimhon   . Blood transfusion   . Chronic kidney disease     occ uti's  . GERD (gastroesophageal reflux disease)   . PONV (postoperative nausea and vomiting)   . DVT of leg (deep venous thrombosis)     left; S/P OR  . Atrial flutter     "sometimes"  .  Bronchitis   . Pneumonia     "walking"  . Shortness of breath on exertion   . Anemia   . Stroke 2002    residual:  "little tingling in palm of left hand"  . Kidney stones 1990's  . UTI (lower urinary tract infection)     "I've had a few"  :  Past Surgical History  Procedure Date  . Abdominal hysterectomy. Ovaries removed    . Bile duct stent placement ~ 01/2011  . Pelvic bone tumor removal     twice in 2000's    F/u xrt to pelvis  . Colonic perforation repair ~ 2000  . Total hip arthroplasty 1980's    right  . Joint replacement   . Colon surgery     hole in intestine ,tumors?  . Tonsillectomy ~ 1946  . Cholecystectomy 03/23/11    lap chole   . Replacement total knee bilateral ~ 2008; ~ 2010    right; left  . Cataract extraction w/ intraocular lens  implant, bilateral 1990's  . Hemorrhoid surgery 2012  . Breast lumpectomy 06/29/11    right  . Breast lumpectomy 06/29/2011    Procedure: BREAST LUMPECTOMY WITH EXCISION OF SENTINEL NODE;  Surgeon: Pedro Earls, MD;  Location: [place];  Service: General;  Laterality: Right;  right sentinel node mapping,right sentinel node  biopsy, needle localization right breast lumpectomy  :  Current outpatient prescriptions:allopurinol (ZYLOPRIM) 100 MG tablet, Take 100 mg by mouth daily. Take 2 tablets every other day and 1 tablet every other day , Disp: , Rfl: ;  amLODipine (NORVASC) 5 MG tablet, Take 5 mg by mouth daily.  , Disp: , Rfl: ;  aspirin 325 MG tablet, Take 325 mg by mouth daily.  , Disp: , Rfl: ;  furosemide (LASIX) 40 MG tablet, Take 40 mg by mouth daily.  , Disp: , Rfl:  lisinopril (PRINIVIL,ZESTRIL) 40 MG tablet, Take 40 mg by mouth daily., Disp: , Rfl: ;  Polyvinyl Alcohol (LIQUID TEARS OP), Place 1 drop into both eyes 2 (two) times daily as needed. For dry eyes, Disp: , Rfl: ;  cephALEXin (KEFLEX) 500 MG capsule, Take 1 capsule (500 mg total) by mouth 4 (four) times daily., Disp: 40 capsule, Rfl: 0 Current facility-administered  medications:cefTRIAXone (ROCEPHIN) 2 g in dextrose 5 % 50 mL IVPB, 2 g, Intravenous, Q24H, [name], MD:     . cefTRIAXone (ROCEPHIN) 1-2 GM IVPB  2 g Intravenous Q24H  :  Allergies  Allergen Reactions  . Ace Inhibitors   . Atorvastatin     REACTION: myalgias  . Codeine   . Oxycodone-Acetaminophen     REACTION: confusion, fatigue  . Rosuvastatin     REACTION: leg weakness  . Simvastatin     REACTION: leg weakness  . Sulfonamide Derivatives   :  Family History  Problem Relation Age of Onset  . Breast cancer Sister     x 2  Both post menopausal  . Cancer Sister     breast  . Prostate cancer Brother   . Colon cancer Neg Hx   . Stroke Mother   . Kidney failure Sister   :  History   Social History  . Marital Status:  Married x 45 y    Spouse Name: N/A    Number of Children: 2- 1- [place], , 1-gso, 6-grandkids, 1 -great-grandchild  . Years of Education: N/A   Occupational History  . 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    Social History Main Topics  . Smoking status: Never Smoker   . Smokeless tobacco: Never Used  . Alcohol Use: No  . Drug Use: No  . Sexually Active: No   Other Topics Concern  . Not on file   Social History Narrative  . No narrative on file  :  @Reproductive  History@G5P2 , menarche-15, menopause at time of hysterectomy, no long term use of HRT.  A comprehensive review of systems was negative except for: Respiratory: positive for dyspnea on exertion, Rt breast swollen and painful  Exam: Pleasant alert woman  With mild chills, Tmax 99.4 HEENT: Normal oropharynx, no thyromegaly  Chest : Air entry bilaterally is normal , no rales or rhonchi cardiovascular exam : Heart sounds, no heaves thrills bruits or murmurs  Abdominal exam : Normal bowel sounds no organomegaly or masses  Breasts: , rt breast s/p lumpectomy with swelling and ecchymoses. The breast itself is tautt and quite hot and  tender. Extremeties: Normal    Basename 08/01/11 1319  WBC 10.6*  HGB 12.0  HCT 39.3  PLT 215     Blood smear review: n/a  Pathology:as above  No results found.  Assessment and Plan:  Pleasant postmenopausal woman presents with T2 N0 ER/PR positive breast cancer. She does have evidence of a seroma which is a little concerning  as it does have some fevers on concern is made infected. I have notified Dr. Hassell Done and I have given her a dose of Rocephin IV and a prescription for Keflex to begin this evening. In addition we have drawn blood cultures.  From a breast cancer point of view I will refer to see Dr. Valere Dross who she has seen before for consideration of radiation therapy. She seems to be good candidate for adjuvant hormonal therapy and likely will not require chemotherapy. Her risks of recurrence would be fairly low and as such would be a great candidate for adjuvant hormonal therapy. In followup we will check a bone density test given that she has not had one  for 5-6 years as well as a vitamin D level . I plan to see her in a few weeks' time. If she is not started on radiation therapy we'll make arrangements to start her on Arimidex.  I took the opportunity while she was here to have received genetics counselor who felt to low risk for genetic testing and so this will not be carried out of note is that she does have a sister been followed in our cancer center may be a better candidate for genetic testing  Visit 60 minutes was spent with this patient half the time and patient-related counseling  [name] M.D. FRCP C.  Date of service 08/01/2011

## 2011-08-01 NOTE — Telephone Encounter (Signed)
gve the pt her April 2013 appts along with the bone density appt

## 2011-08-03 ENCOUNTER — Ambulatory Visit (INDEPENDENT_AMBULATORY_CARE_PROVIDER_SITE_OTHER): Payer: Medicare Other | Admitting: Surgery

## 2011-08-03 ENCOUNTER — Encounter: Payer: Self-pay | Admitting: *Deleted

## 2011-08-03 ENCOUNTER — Encounter (INDEPENDENT_AMBULATORY_CARE_PROVIDER_SITE_OTHER): Payer: Self-pay | Admitting: Surgery

## 2011-08-03 VITALS — BP 146/68 | HR 70 | Temp 96.9°F | Resp 16 | Ht 66.0 in | Wt 239.0 lb

## 2011-08-03 DIAGNOSIS — T148XXA Other injury of unspecified body region, initial encounter: Secondary | ICD-10-CM

## 2011-08-03 NOTE — Progress Notes (Signed)
[name] 78 y.o.  Body mass index is 38.58 kg/(m^2).  Patient Active Problem List  Diagnoses  . POLYP, COLON  . DIABETES MELLITUS, TYPE II  . HYPERLIPIDEMIA  . GOUT  . ANXIETY  . PERIPHERAL NEUROPATHY  . HYPERTENSION  . CVA  . DIVERTICULOSIS, COLON  . IRRITABLE BOWEL, PREDOMINANTLY CONSTIPATION  . FATIGUE  . COLONIC POLYPS, HX OF  . TREMOR  . Bile duct calculus with nonacute cholecystitis  . Morbid obesity  . Preventative health care  . Shingles  . Breast cancer, right breast-lobular s/p lumpectomy and sentinel node biopsy  . Cancer of upper-outer quadrant of female breast    Allergies  Allergen Reactions  . Ace Inhibitors   . Atorvastatin     REACTION: myalgias  . Codeine   . Oxycodone-Acetaminophen     REACTION: confusion, fatigue  . Rosuvastatin     REACTION: leg weakness  . Simvastatin     REACTION: leg weakness  . Sulfonamide Derivatives     Past Surgical History  Procedure Date  . Abdominal hysterectomy   . Bile duct stent placement ~ 01/2011  . Pelvic bone tumor removal     twice in 2000's  . Colonic perforation repair ~ 2000  . Total hip arthroplasty 1980's    right  . Joint replacement   . Colon surgery     hole in intestine ,tumors?  . Tonsillectomy ~ 1946  . Cholecystectomy 03/23/11    lap chole   . Replacement total knee bilateral ~ 2008; ~ 2010    right; left  . Cataract extraction w/ intraocular lens  implant, bilateral 1990's  . Hemorrhoid surgery 2012  . Breast lumpectomy 06/29/11    right  . Breast lumpectomy 06/29/2011    Procedure: BREAST LUMPECTOMY WITH EXCISION OF SENTINEL NODE;  Surgeon: Marie Earls, MD;  Location: [place];  Service: General;  Laterality: Right;  right sentinel node mapping,right sentinel node biopsy, needle localization right breast lumpectomy   Marie Cower, MD, MD No diagnosis found.  [name] had a large right lumpectomy cavity and hematoma.Despite aspiration, it has spontaneously drained. She saw [name] and he has started her on antibiotics. Today I physically compressed and facilitated drainage and then irrigated with hydrogen peroxide. This was dressed and I encouraged to to dran on its on. I'll see her back within the next week or sooner if needed.  Impression:  Postophematoma/seroma, spontaneously draining Marie B. Hassell Done, MD, [facility] [phone] beeper [phone]  08/03/2011 2:18 PM

## 2011-08-03 NOTE — Progress Notes (Signed)
Mailed after appt letter to pt. 

## 2011-08-04 ENCOUNTER — Encounter (INDEPENDENT_AMBULATORY_CARE_PROVIDER_SITE_OTHER): Payer: Medicare Other | Admitting: Surgery

## 2011-08-08 LAB — CULTURE, BLOOD (SINGLE)

## 2011-08-10 ENCOUNTER — Ambulatory Visit
Admission: RE | Admit: 2011-08-10 | Discharge: 2011-08-10 | Disposition: A | Discharge status: Home / Self Care | Payer: Medicare Other | Source: Ambulatory Visit | Attending: Radiation Oncology | Admitting: Radiation Oncology

## 2011-08-10 ENCOUNTER — Encounter: Payer: Self-pay | Admitting: Radiation Oncology

## 2011-08-10 VITALS — BP 151/79 | HR 80 | Temp 97.1°F | Resp 18 | Ht 66.0 in | Wt 244.4 lb

## 2011-08-10 DIAGNOSIS — C50911 Malignant neoplasm of unspecified site of right female breast: Secondary | ICD-10-CM

## 2011-08-10 DIAGNOSIS — Z8673 Personal history of transient ischemic attack (TIA), and cerebral infarction without residual deficits: Secondary | ICD-10-CM | POA: Insufficient documentation

## 2011-08-10 DIAGNOSIS — N189 Chronic kidney disease, unspecified: Secondary | ICD-10-CM | POA: Insufficient documentation

## 2011-08-10 DIAGNOSIS — K219 Gastro-esophageal reflux disease without esophagitis: Secondary | ICD-10-CM | POA: Insufficient documentation

## 2011-08-10 DIAGNOSIS — C50419 Malignant neoplasm of upper-outer quadrant of unspecified female breast: Secondary | ICD-10-CM

## 2011-08-10 DIAGNOSIS — E785 Hyperlipidemia, unspecified: Secondary | ICD-10-CM | POA: Insufficient documentation

## 2011-08-10 DIAGNOSIS — Z90710 Acquired absence of both cervix and uterus: Secondary | ICD-10-CM | POA: Insufficient documentation

## 2011-08-10 DIAGNOSIS — C50919 Malignant neoplasm of unspecified site of unspecified female breast: Secondary | ICD-10-CM | POA: Insufficient documentation

## 2011-08-10 DIAGNOSIS — Z86718 Personal history of other venous thrombosis and embolism: Secondary | ICD-10-CM | POA: Insufficient documentation

## 2011-08-10 DIAGNOSIS — Z510 Encounter for antineoplastic radiation therapy: Secondary | ICD-10-CM | POA: Insufficient documentation

## 2011-08-10 DIAGNOSIS — Z79899 Other long term (current) drug therapy: Secondary | ICD-10-CM | POA: Insufficient documentation

## 2011-08-10 DIAGNOSIS — I129 Hypertensive chronic kidney disease with stage 1 through stage 4 chronic kidney disease, or unspecified chronic kidney disease: Secondary | ICD-10-CM | POA: Insufficient documentation

## 2011-08-10 NOTE — Progress Notes (Signed)
Patient presents to the clinic today unaccompanied for a re consult with Dr. Valere Dross. Patient is alert and oriented to person, place, and time. No distress noted. Steady gait noted with rolling walker. Pleasant affect noted. Patient denies pain at this time. Patient reports aching joints related to the effects of arthritis. Patient denies vomiting, headache or dizziness. Patient states, "Occasionally I get a whiff of this (breast/surgical site) and it makes my stomach turn." Patient scheduled for DEXA scan 3/6, follow up with [facility] 3/8, and follow up with [facility] 4/9. Reported all findings to Dr. Valere Dross.

## 2011-08-10 NOTE — Progress Notes (Signed)
[facility] NEW PATIENT EVALUATION  Name: [name] MRN: [id number]  Date: 08/10/2011  DOB: [birth year]  Status: outpatient   CC: [name], MD, MD  [name], MD , Dr. Johnathan Hausen  REFERRING PHYSICIAN: [name], MD   DIAGNOSIS: Pathologic stage IIA (T2, N0, M0) invasive ductal/DCIS of the right breast    HISTORY OF PRESENT ILLNESS:  [name] is a 78 y.o. female who is seen today for the courtesy of Dr. [name] and Dr. Johnathan Hausen for evaluation of her pathologic stage TIIA (T2, N0, M0 (invasive ductal/DCIS of the right breast. At the time of screening mammography at the breast Center on 05/23/2011 she was sent to have a possible mass within the right breast. There were no calcifications. Additional views and ultrasound showed a 1.3 x 1.1 x 1.5 cm mass at 10:00 within the right breast, 7 cm from the nipple. There was nothing palpable. An ultrasound-guided biopsy on 06/09/2011 was diagnostic for invasive ductal carcinoma which was strongly ER positive at 100% and strongly PR positive at 100% with a low proliferation marker Ki-67 of 9%. Breast MRI on 06/17/2011 showed a solitary mass within the right breast measuring 2.8 x 1.7 x 1.3 cm. She was seen by Dr. Johnathan Hausen who performed a right partial mastectomy and sentinel lymph node biopsy. She is found to have a 2.8 cm invasive ductal carcinoma along with low-grade DCIS. The closest margin was 0.2 cm for both the invasive and noninvasive disease. A single sentinel lymph node was free of metastatic disease. She was seen in consultation by Dr. Truddie Coco recommends adjuvant hormone therapy. She developed a draining hematoma postoperatively and has been on antibiotics for a possible infection. She is currently draining hemorrhagic fluid. Of note is that she is back on aspirin.   PREVIOUS RADIATION THERAPY: Status post 4500 cGy delivered in 25 sessions from 01/10/2001 through 02/14/2001 to the  posterior pelvis for recurrent myelolipoma of the presacral region. No evidence for recurrent disease.   PAST MEDICAL HISTORY:  has a past medical history of Hypertension; Hyperlipidemia; Morbid obesity; Osteoarthritis; Gout; CVA (cerebral infarction); IBS (irritable bowel syndrome); Diverticulosis; Esophageal stricture; Colon polyps; Anxiety; Hiatal hernia; Shingles (02/21/2011); Skin cancer; Breast cancer; Dysrhythmia; Blood transfusion; Chronic kidney disease; GERD (gastroesophageal reflux disease); PONV (postoperative nausea and vomiting); DVT of leg (deep venous thrombosis); Atrial flutter; Bronchitis; Pneumonia; Shortness of breath on exertion; Anemia; Stroke (2002); Kidney stones (1990's); and UTI (lower urinary tract infection).     PAST SURGICAL HISTORY:  Past Surgical History  Procedure Date  . Abdominal hysterectomy   . Bile duct stent placement ~ 01/2011  . Pelvic bone tumor removal     twice in 2000's  . Colonic perforation repair ~ 2000  . Total hip arthroplasty 1980's    right  . Joint replacement   . Colon surgery     hole in intestine ,tumors?  . Tonsillectomy ~ 1946  . Cholecystectomy 03/23/11    lap chole   . Replacement total knee bilateral ~ 2008; ~ 2010    right; left  . Cataract extraction w/ intraocular lens  implant, bilateral 1990's  . Hemorrhoid surgery 2012  . Breast lumpectomy 06/29/11    right  . Breast lumpectomy 06/29/2011    Procedure: BREAST LUMPECTOMY WITH EXCISION OF SENTINEL NODE;  Surgeon: Pedro Earls, MD;  Location: [place];  Service: General;  Laterality: Right;  right sentinel node mapping,right sentinel node biopsy, needle localization right  breast lumpectomy     FAMILY HISTORY: family history includes Breast cancer in her sister; Cancer in her sister; Kidney failure in her sister; Prostate cancer in her brother; and Stroke in her mother.  There is no history of Colon cancer. Her father died from complications of COPD at age 32. Her mother  died following a stroke at age 47.   SOCIAL HISTORY:  reports that she has never smoked. She has never used smokeless tobacco. She reports that she does not drink alcohol or use illicit drugs. Married, 2 children. She worked as a Theme park manager.   ALLERGIES: Ace inhibitors; Atorvastatin; Codeine; Oxycodone-acetaminophen; Rosuvastatin; Simvastatin; and Sulfonamide derivatives   MEDICATIONS:  Current Outpatient Prescriptions  Medication Sig Dispense Refill  . allopurinol (ZYLOPRIM) 100 MG tablet Take 100 mg by mouth daily. Take 2 tablets every other day and 1 tablet every other day       . amLODipine (NORVASC) 5 MG tablet Take 5 mg by mouth daily.        Marland Kitchen aspirin 325 MG tablet Take 325 mg by mouth daily.        . cephALEXin (KEFLEX) 500 MG capsule Take 1 capsule (500 mg total) by mouth 4 (four) times daily.  40 capsule  0  . furosemide (LASIX) 40 MG tablet Take 40 mg by mouth daily.        Marland Kitchen lisinopril (PRINIVIL,ZESTRIL) 40 MG tablet Take 40 mg by mouth daily.      . Polyvinyl Alcohol (LIQUID TEARS OP) Place 1 drop into both eyes 2 (two) times daily as needed. For dry eyes          REVIEW OF SYSTEMS:  Pertinent items are noted in HPI.    PHYSICAL EXAM:  height is 5\' 6"  (1.676 m) and weight is 244 lb 6.4 oz (110.859 kg). Her oral temperature is 97.1 F (36.2 C). Her blood pressure is 151/79 and her pulse is 80. Her respiration is 18.   Head and neck examination grossly unremarkable. Nodes: Without palpable cervical, supraclavicular, or axillary lymphadenopathy. Chest: Lungs clear. Heart: Regular in rhythm. Back: Without spinal or CVA tenderness. Breasts: There is a 1 cm area of dehiscence along the lateral right breast at 9:00 draining hemorrhagic fluid. There is discoloration of the overlying skin with some warmth. There is no drainage of purulent material. No other masses are appreciated. Left breast without masses or lesions. Abdomen: Without masses or organomegaly. Extremities: Without  upper extremity edema.   LABORATORY DATA:  Lab Results  Component Value Date   WBC 10.6* 08/01/2011   HGB 12.0 08/01/2011   HCT 39.3 08/01/2011   MCV 93.3 08/01/2011   PLT 215 08/01/2011   Lab Results  Component Value Date   NA 140 08/01/2011   K 4.5 08/01/2011   CL 101 08/01/2011   CO2 29 08/01/2011   Lab Results  Component Value Date   ALT 15 08/01/2011   AST 20 08/01/2011   ALKPHOS 83 08/01/2011   BILITOT 1.1 08/01/2011      IMPRESSION: Pathologic stage II A. (T2, N0, M0 (invasive ductal/DCIS of the right breast. I explained to the patient that her local treatment options include mastectomy versus partial mastectomy with adjuvant hormone therapy plus or minus adjuvant radiation therapy. The CALGB over age  82 trial was limited to patients with stage I disease. She has stage II disease. She does have a favorable grade, but in view of a T2 primary with a 2 mm margin, I would certainly  offer her the opportunity for improved local control with right breast irradiation. I discussed her case with Dr. Pablo Ledger appear She does have some medical comorbidities but nothing that is life-threatening. She may be a candidate for short course/hypo-fractionated radiation therapy which should be reasonably well tolerated. She understands that this would not affect her overall survival but would simply decrease her risk for a local recurrence. I discussed the potential acute and late toxicities of radiation therapy and she wishes to proceed as outlined. Adjuvant hormone therapy to follow her course of radiation therapy. She'll probably take a number of weeks before she closes her wound, and I'll have her return here for treatment planning the week of March 18 but not begin her treatment until she has completely healed.  PLAN: As discussed above.   I spent 60 minutes minutes face to face with the patient and more than 50% of that time was spent in counseling and/or coordination of care.

## 2011-08-10 NOTE — Progress Notes (Signed)
78 year old female. Married with two children. 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   Underwent annual screening mammogram 12/2012revealed area of abnormality 7 cm from nipple 1.5x1.3x1.1 cm. Ultrasound of axilla negative Biopsy 06/09/2011 showed invasive ductal ca HER 2 negative, ER, PR positive. MRI 06/17/2011 1/413 show mass measure 2.8x1.7x1.3. Lumpectomy with sentinel node evaluation done 06/29/11. Pathology revealed 2.8 cm grade 1 invasive ductal ca with close surgical margins. Sentinel lymph node negative for malignancy. Post surgical course complicated by hematoma. Dr. Truddie Coco thinks this patient is a good candidate for adjuvant hormonal therapy.    [id number] radiation therapy to posterior pelvis 01/10/01-02/14/2001 as curative postoperative 4500 cGy in 25 sessions under the care of Dr. Valere Dross. No indication of a pacemaker. AX:ace inhibitors, atorvastatin, codeine, oxycodone-acetaminophen, rosuvastatin, simvastatin, and sulfonamide

## 2011-08-10 NOTE — Progress Notes (Signed)
Complete PATIENT MEASURE OF DISTRESS worksheet with a score of 0 submitted to social work. Also, complete NUTRITION RISK SCREEN worksheet without concerns submitted to Ernestene Kiel, [place]

## 2011-08-10 NOTE — Progress Notes (Signed)
Please see the Nurse Progress Note in the MD Initial Consult Encounter for this patient. 

## 2011-08-11 ENCOUNTER — Telehealth: Payer: Self-pay | Admitting: *Deleted

## 2011-08-11 ENCOUNTER — Telehealth (INDEPENDENT_AMBULATORY_CARE_PROVIDER_SITE_OTHER): Payer: Self-pay

## 2011-08-11 NOTE — Telephone Encounter (Signed)
Pt is calling regarding MD's advisement on whether she can d/c Aspirin at this time because of hematoma from surgery that is bleeding. Pt contacted Dr. Earlie Server office and was told to contact PCP's office regarding Aspirin.

## 2011-08-11 NOTE — Telephone Encounter (Signed)
Called the patient and advised her that Dr Hassell Done stated it was ok to DC her asa until her hematoma is resolved.

## 2011-08-11 NOTE — Telephone Encounter (Signed)
Patient informed of MD's instructions

## 2011-08-11 NOTE — Telephone Encounter (Signed)
Pt calling in to ask Dr Hassell Done if he thinks it's ok for pt to stop her aspirin until she gets the hematoma under better control. I spoke to [facility] Dr Martin's nurse and she will discuss this with Dr Hassell Done then call the pt back. Olivia Mackie requested pt to call her PCP to run this by them about her stopping the Asprin while she checks with Dr Hassell Done. The pt understands and she will call her PCP.

## 2011-08-11 NOTE — Telephone Encounter (Signed)
Ok to stop ASA for 1 wk to allow healing, but will need to re-start at that time due to hx of stroke and need to prevent more in future

## 2011-08-11 NOTE — Progress Notes (Signed)
Encounter addended by: Levy Pupa, RN on: 08/11/2011 12:01 PM<BR>     Documentation filed: Charges VN, Inpatient Patient Education

## 2011-08-16 ENCOUNTER — Encounter: Payer: Self-pay | Admitting: Internal Medicine

## 2011-08-16 ENCOUNTER — Other Ambulatory Visit (INDEPENDENT_AMBULATORY_CARE_PROVIDER_SITE_OTHER): Payer: Medicare Other

## 2011-08-16 ENCOUNTER — Ambulatory Visit (INDEPENDENT_AMBULATORY_CARE_PROVIDER_SITE_OTHER): Payer: Medicare Other | Admitting: Internal Medicine

## 2011-08-16 VITALS — BP 122/72 | HR 74 | Temp 97.8°F | Ht 66.0 in | Wt 249.0 lb

## 2011-08-16 DIAGNOSIS — E119 Type 2 diabetes mellitus without complications: Secondary | ICD-10-CM

## 2011-08-16 DIAGNOSIS — Z0000 Encounter for general adult medical examination without abnormal findings: Secondary | ICD-10-CM

## 2011-08-16 LAB — LDL CHOLESTEROL, DIRECT: Direct LDL: 130.9 mg/dL

## 2011-08-16 LAB — LIPID PANEL
Cholesterol: 195 mg/dL (ref 0–200)
HDL: 34.5 mg/dL — ABNORMAL LOW (ref >39.00)
Total CHOL/HDL Ratio: 6
Triglycerides: 261 mg/dL — ABNORMAL HIGH (ref 0.0–149.0)
VLDL: 52.2 mg/dL — ABNORMAL HIGH (ref 0.0–40.0)

## 2011-08-16 LAB — MICROALBUMIN / CREATININE URINE RATIO
Creatinine,U: 27 mg/dL
Microalb Creat Ratio: 1.1 mg/g (ref 0.0–30.0)
Microalb, Ur: 0.3 mg/dL (ref 0.0–1.9)

## 2011-08-16 LAB — HEMOGLOBIN A1C: Hgb A1c MFr Bld: 5.9 % (ref 4.6–6.5)

## 2011-08-16 LAB — TSH: TSH: 4.82 u[IU]/mL (ref 0.35–5.50)

## 2011-08-16 NOTE — Patient Instructions (Signed)
Continue all other medications as before Please keep your appointments with your specialists as you have planned Please go to LAB in the Basement for the blood and/or urine tests to be done today Please call the phone number [phone] (the [facility]) for results of testing in 2-3 days;  When calling, simply dial the number, and when prompted enter the MRN number above (the Medical Record Number) and the # key, then the message should start. Please return in 6 mo with Lab testing done 3-5 days before

## 2011-08-17 ENCOUNTER — Ambulatory Visit
Admission: RE | Admit: 2011-08-17 | Discharge: 2011-08-17 | Disposition: A | Discharge status: Home / Self Care | Payer: Medicare Other | Source: Ambulatory Visit | Attending: Oncology | Admitting: Oncology

## 2011-08-17 DIAGNOSIS — Z780 Asymptomatic menopausal state: Secondary | ICD-10-CM

## 2011-08-18 ENCOUNTER — Other Ambulatory Visit: Payer: Self-pay | Admitting: Internal Medicine

## 2011-08-19 ENCOUNTER — Ambulatory Visit (INDEPENDENT_AMBULATORY_CARE_PROVIDER_SITE_OTHER): Payer: Medicare Other | Admitting: Surgery

## 2011-08-19 ENCOUNTER — Encounter (INDEPENDENT_AMBULATORY_CARE_PROVIDER_SITE_OTHER): Payer: Self-pay | Admitting: Surgery

## 2011-08-19 VITALS — BP 148/78 | HR 80 | Temp 97.3°F | Resp 24 | Ht 66.0 in | Wt 244.0 lb

## 2011-08-19 DIAGNOSIS — C50911 Malignant neoplasm of unspecified site of right female breast: Secondary | ICD-10-CM

## 2011-08-19 DIAGNOSIS — C50919 Malignant neoplasm of unspecified site of unspecified female breast: Secondary | ICD-10-CM

## 2011-08-19 NOTE — Progress Notes (Signed)
[name] comes in today and she is still passing some clots from her breast. There is a little bit of proud flesh at the 1 cm opening and I cauterized that with silver nitrate. I placed Neosporin ointment. I'll see her back in 2 weeks

## 2011-08-21 ENCOUNTER — Encounter: Payer: Self-pay | Admitting: Internal Medicine

## 2011-08-21 NOTE — Assessment & Plan Note (Signed)

## 2011-08-21 NOTE — Progress Notes (Signed)
Subjective:    Patient ID: [name], female    DOB: [birth year], 78 y.o.   MRN: [id number]  HPI  Here for wellness and f/u;  Overall doing ok;  Pt denies CP, worsening SOB, DOE, wheezing, orthopnea, PND, worsening LE edema, palpitations, dizziness or syncope.  Pt denies neurological change such as new Headache, facial or extremity weakness.  Pt denies polydipsia, polyuria, or low sugar symptoms. Pt states overall good compliance with treatment and medications, good tolerability, and trying to follow lower cholesterol diet.  Pt denies worsening depressive symptoms, suicidal ideation or panic. No fever, wt loss, night sweats, loss of appetite, or other constitutional symptoms.  Pt states good ability with ADL's, low fall risk, home safety reviewed and adequate, no significant changes in hearing or vision, and occasionally active with exercise.  No acute complaints  S/p ccx.  Dx and tx for right breast ca, for 3-4 wks xrt total.  Had large hematoma with recent cellulitis, now improved s/p antibx, with come echymosis persists. Past Medical History  Diagnosis Date  . Hypertension   . Hyperlipidemia   . Morbid obesity   . Osteoarthritis   . Gout   . CVA (cerebral infarction)   . IBS (irritable bowel syndrome)   . Diverticulosis   . Esophageal stricture   . Colon polyps     hyperplastic  . Anxiety   . Hiatal hernia     4 cm  . Shingles 02/21/2011  . Skin cancer   . Breast cancer     right breast  . Dysrhythmia     dr bensimhon   . Blood transfusion   . Chronic kidney disease     occ uti's  . GERD (gastroesophageal reflux disease)   . PONV (postoperative nausea and vomiting)   . DVT of leg (deep venous thrombosis)     left; S/P OR  . Atrial flutter     "sometimes"  . Bronchitis   . Pneumonia     "walking"  . Shortness of breath on exertion   . Anemia   . Stroke 2002    residual:  "little tingling in palm of left hand"  . Kidney stones 1990's  . UTI (lower urinary tract  infection)     "I've had a few"   Past Surgical History  Procedure Date  . Abdominal hysterectomy   . Bile duct stent placement ~ 01/2011  . Pelvic bone tumor removal     twice in 2000's  . Colonic perforation repair ~ 2000  . Total hip arthroplasty 1980's    right  . Joint replacement   . Colon surgery     hole in intestine ,tumors?  . Tonsillectomy ~ 1946  . Cholecystectomy 03/23/11    lap chole   . Replacement total knee bilateral ~ 2008; ~ 2010    right; left  . Cataract extraction w/ intraocular lens  implant, bilateral 1990's  . Hemorrhoid surgery 2012  . Breast lumpectomy 06/29/11    right  . Breast lumpectomy 06/29/2011    Procedure: BREAST LUMPECTOMY WITH EXCISION OF SENTINEL NODE;  Surgeon: Pedro Earls, MD;  Location: [place];  Service: General;  Laterality: Right;  right sentinel node mapping,right sentinel node biopsy, needle localization right breast lumpectomy  . Incision and drainage breast abscess     right breast seroma    reports that she has never smoked. She has never used smokeless tobacco. She reports that she does not drink alcohol or use illicit  drugs. family history includes Breast cancer in her sister; Cancer in her sister; Kidney failure in her sister; Prostate cancer in her brother; and Stroke in her mother.  There is no history of Colon cancer. Allergies  Allergen Reactions  . Ace Inhibitors   . Atorvastatin     REACTION: myalgias  . Codeine   . Oxycodone-Acetaminophen     REACTION: confusion, fatigue  . Rosuvastatin     REACTION: leg weakness  . Simvastatin     REACTION: leg weakness  . Sulfonamide Derivatives    Current Outpatient Prescriptions on File Prior to Visit  Medication Sig Dispense Refill  . allopurinol (ZYLOPRIM) 100 MG tablet Take 100 mg by mouth daily. Take 2 tablets every other day and 1 tablet every other day       . amLODipine (NORVASC) 5 MG tablet Take 5 mg by mouth daily.        Marland Kitchen lisinopril (PRINIVIL,ZESTRIL) 40 MG  tablet Take 40 mg by mouth daily.      . Polyvinyl Alcohol (LIQUID TEARS OP) Place 1 drop into both eyes 2 (two) times daily as needed. For dry eyes      . aspirin 325 MG tablet Take 325 mg by mouth daily.         Review of Systems Review of Systems  Constitutional: Negative for diaphoresis, activity change, appetite change and unexpected weight change.  HENT: Negative for hearing loss, ear pain, facial swelling, mouth sores and neck stiffness.   Eyes: Negative for pain, redness and visual disturbance.  Respiratory: Negative for shortness of breath and wheezing.   Cardiovascular: Negative for chest pain and palpitations.  Gastrointestinal: Negative for diarrhea, blood in stool, abdominal distention and rectal pain.  Genitourinary: Negative for hematuria, flank pain and decreased urine volume.  Musculoskeletal: Negative for myalgias and joint swelling.  Skin: Negative for color change and wound.  Neurological: Negative for syncope and numbness.  Hematological: Negative for adenopathy.  Psychiatric/Behavioral: Negative for hallucinations, self-injury, decreased concentration and agitation.      Objective:   Physical Exam BP 122/72  Pulse 74  Temp(Src) 97.8 F (36.6 C) (Oral)  Ht 5\' 6"  (1.676 m)  Wt 249 lb (112.946 kg)  BMI 40.19 kg/m2  SpO2 97% Physical Exam  VS noted Constitutional: Pt is oriented to person, place, and time. Appears well-developed and well-nourished.  HENT:  Head: Normocephalic and atraumatic.  Right Ear: External ear normal.  Left Ear: External ear normal.  Nose: Nose normal.  Mouth/Throat: Oropharynx is clear and moist.  Eyes: Conjunctivae and EOM are normal. Pupils are equal, round, and reactive to light.  Neck: Normal range of motion. Neck supple. No JVD present. No tracheal deviation present.  Cardiovascular: Normal rate, regular rhythm, normal heart sounds and intact distal pulses.   Pulmonary/Chest: Effort normal and breath sounds normal.  Abdominal:  Soft. Bowel sounds are normal. There is no tenderness.  Musculoskeletal: Normal range of motion. Exhibits no edema.  Lymphadenopathy:  Has no cervical adenopathy.  Neurological: Pt is alert and oriented to person, place, and time. Pt has normal reflexes. No cranial nerve deficit.  Skin: Skin is warm and dry. No rash noted. except for right breast echymosis Psychiatric:  Has  normal mood and affect. Behavior is normal.     Assessment & Plan:

## 2011-08-21 NOTE — Assessment & Plan Note (Signed)
stable overall by hx and exam, most recent data reviewed with pt, and pt to continue medical treatment as before Lab Results  Component Value Date   HGBA1C 5.9 08/16/2011

## 2011-08-29 ENCOUNTER — Ambulatory Visit: Admission: RE | Admit: 2011-08-29 | Payer: Medicare Other | Source: Ambulatory Visit | Admitting: Radiation Oncology

## 2011-08-29 ENCOUNTER — Encounter: Payer: Self-pay | Admitting: Radiation Oncology

## 2011-08-29 ENCOUNTER — Ambulatory Visit: Payer: Medicare Other

## 2011-08-29 NOTE — Progress Notes (Signed)
08/29/2011 1508 Removed old right lateral breast dressing. Old dressing with copious amounts of bright red blood noted. Applied numerous new 4x4 gauze directly to site reinforced with an ABD pad and secured with paper tape and mesh tube top. Provided patient with supplies and educated patient on how to care for wound at home.

## 2011-09-01 ENCOUNTER — Ambulatory Visit (INDEPENDENT_AMBULATORY_CARE_PROVIDER_SITE_OTHER): Payer: Medicare Other | Admitting: Surgery

## 2011-09-01 ENCOUNTER — Encounter (INDEPENDENT_AMBULATORY_CARE_PROVIDER_SITE_OTHER): Payer: Self-pay | Admitting: Surgery

## 2011-09-01 VITALS — BP 166/68 | HR 84 | Temp 97.5°F | Resp 20 | Ht 66.0 in | Wt 249.0 lb

## 2011-09-01 DIAGNOSIS — C50911 Malignant neoplasm of unspecified site of right female breast: Secondary | ICD-10-CM

## 2011-09-01 DIAGNOSIS — C50919 Malignant neoplasm of unspecified site of unspecified female breast: Secondary | ICD-10-CM

## 2011-09-01 NOTE — Progress Notes (Signed)
[name] 78 y.o.  Body mass index is 40.19 kg/(m^2).  Patient Active Problem List  Diagnoses  . POLYP, COLON  . DIABETES MELLITUS, TYPE II  . HYPERLIPIDEMIA  . GOUT  . ANXIETY  . PERIPHERAL NEUROPATHY  . HYPERTENSION  . CVA  . DIVERTICULOSIS, COLON  . IRRITABLE BOWEL, PREDOMINANTLY CONSTIPATION  . FATIGUE  . COLONIC POLYPS, HX OF  . TREMOR  . Bile duct calculus with nonacute cholecystitis  . Morbid obesity  . Preventative health care  . Shingles  . Breast cancer, right breast-lobular s/p lumpectomy and sentinel node biopsy  . Cancer of upper-outer quadrant of female breast    Allergies  Allergen Reactions  . Ace Inhibitors   . Atorvastatin     REACTION: myalgias  . Codeine   . Oxycodone-Acetaminophen     REACTION: confusion, fatigue  . Rosuvastatin     REACTION: leg weakness  . Simvastatin     REACTION: leg weakness  . Sulfonamide Derivatives     Past Surgical History  Procedure Date  . Abdominal hysterectomy   . Bile duct stent placement ~ 01/2011  . Pelvic bone tumor removal     twice in 2000's  . Colonic perforation repair ~ 2000  . Total hip arthroplasty 1980's    right  . Joint replacement   . Colon surgery     hole in intestine ,tumors?  . Tonsillectomy ~ 1946  . Cholecystectomy 03/23/11    lap chole   . Replacement total knee bilateral ~ 2008; ~ 2010    right; left  . Cataract extraction w/ intraocular lens  implant, bilateral 1990's  . Hemorrhoid surgery 2012  . Breast lumpectomy 06/29/11    right  . Breast lumpectomy 06/29/2011    Procedure: BREAST LUMPECTOMY WITH EXCISION OF SENTINEL NODE;  Surgeon: Pedro Earls, MD;  Location: [place];  Service: General;  Laterality: Right;  right sentinel node mapping,right sentinel node biopsy, needle localization right breast lumpectomy  . Incision and drainage breast abscess     right breast seroma   [name], MD, MD No diagnosis found.  Bleeding stops from her wound when she stops taking  aspirin.  May hold that until this has healed.  Return 1-2 weeks.  [name], MD, [facility] [phone] beeper [phone]  09/01/2011 11:35 AM

## 2011-09-05 ENCOUNTER — Encounter: Payer: Self-pay | Admitting: Radiation Oncology

## 2011-09-05 ENCOUNTER — Ambulatory Visit: Payer: Medicare Other

## 2011-09-05 ENCOUNTER — Ambulatory Visit: Admission: RE | Admit: 2011-09-05 | Payer: Medicare Other | Source: Ambulatory Visit | Admitting: Radiation Oncology

## 2011-09-05 NOTE — Progress Notes (Signed)
Chart note: [name] was scheduled for simulation today. She is still draining small clots and she has a 1-2 cm open wound. I will get her rescheduled for simulation on April 8 (2 weeks).

## 2011-09-08 ENCOUNTER — Encounter (INDEPENDENT_AMBULATORY_CARE_PROVIDER_SITE_OTHER): Payer: Medicare Other | Admitting: Surgery

## 2011-09-12 ENCOUNTER — Encounter (INDEPENDENT_AMBULATORY_CARE_PROVIDER_SITE_OTHER): Payer: Self-pay | Admitting: Surgery

## 2011-09-14 ENCOUNTER — Ambulatory Visit (INDEPENDENT_AMBULATORY_CARE_PROVIDER_SITE_OTHER): Payer: Medicare Other | Admitting: Surgery

## 2011-09-14 ENCOUNTER — Encounter (INDEPENDENT_AMBULATORY_CARE_PROVIDER_SITE_OTHER): Payer: Self-pay | Admitting: Surgery

## 2011-09-14 VITALS — BP 138/78 | HR 76 | Temp 97.1°F | Resp 18 | Wt 241.0 lb

## 2011-09-14 DIAGNOSIS — C50911 Malignant neoplasm of unspecified site of right female breast: Secondary | ICD-10-CM

## 2011-09-14 DIAGNOSIS — C50919 Malignant neoplasm of unspecified site of unspecified female breast: Secondary | ICD-10-CM

## 2011-09-14 NOTE — Progress Notes (Signed)
Ms. Wethington's right breast opening is still draining some but it is much less.  The "proud flesh" at the exit site I cauterized with silver nitrate.  Redressed with gauze.  Will see back in 3 weeks.  She will be seeing Adela Lank in a few days.

## 2011-09-19 ENCOUNTER — Other Ambulatory Visit: Payer: Self-pay | Admitting: Radiation Oncology

## 2011-09-19 ENCOUNTER — Ambulatory Visit: Payer: Medicare Other

## 2011-09-20 ENCOUNTER — Ambulatory Visit: Payer: Medicare Other

## 2011-09-20 ENCOUNTER — Telehealth: Payer: Self-pay | Admitting: Radiation Oncology

## 2011-09-20 ENCOUNTER — Ambulatory Visit (HOSPITAL_BASED_OUTPATIENT_CLINIC_OR_DEPARTMENT_OTHER): Payer: Medicare Other | Admitting: Oncology

## 2011-09-20 ENCOUNTER — Ambulatory Visit: Admission: RE | Admit: 2011-09-20 | Payer: Medicare Other | Source: Ambulatory Visit | Admitting: Radiation Oncology

## 2011-09-20 VITALS — BP 148/72 | HR 80 | Temp 98.3°F | Ht 68.0 in | Wt 255.2 lb

## 2011-09-20 DIAGNOSIS — E559 Vitamin D deficiency, unspecified: Secondary | ICD-10-CM

## 2011-09-20 DIAGNOSIS — C50419 Malignant neoplasm of upper-outer quadrant of unspecified female breast: Secondary | ICD-10-CM

## 2011-09-20 MED ORDER — ANASTROZOLE 1 MG PO TABS: 1.0000 mg | ORAL_TABLET | Freq: Every day | ORAL | Status: AC

## 2011-09-20 NOTE — Telephone Encounter (Signed)
Returned patient's call. Instructed patient to present for today's appointment per Dr. Charlton Amor order despite the "dripping." Instructed patient Dr. Valere Dross would see her today. Patient verbalized understanding.

## 2011-09-20 NOTE — Progress Notes (Signed)
Hematology and Oncology Follow Up Visit  [name] [id number] December 25, 1929 78 y.o. 09/20/2011 4:07 PM  Dr Jerilynn Mages Hassell Done DIAGNOSIS:   No diagnosis found. Hx of T2NO breast cancer s/p lumpectomy 06/28/11 for 2.8 cm  N) er/pr+ tumor, with delayed wound healing and persistent hematoma/seroma formation, due to start xrt imminenently.  PAST THERAPY:    Interim History:  As above, delayed wound healing  With persistent seroma  Medications: I have reviewed the patient's current medications.  Allergies:  Allergies  Allergen Reactions  . Ace Inhibitors   . Atorvastatin     REACTION: myalgias  . Codeine   . Oxycodone-Acetaminophen     REACTION: confusion, fatigue  . Rosuvastatin     REACTION: leg weakness  . Simvastatin     REACTION: leg weakness  . Sulfonamide Derivatives     Past Medical History, Surgical history, Social history, and Family History were reviewed and updated.  Review of Systems: Constitutional:  Negative for fever, chills, night sweats, anorexia, weight loss, pain. Cardiovascular: no chest pain or dyspnea on exertion negative Respiratory: no cough, shortness of breath, or wheezing Neurological: no TIA or stroke symptoms Dermatological: negative ENT: negative Skin Gastrointestinal: no abdominal pain, change in bowel habits, or black or bloody stools Genito-Urinary: no dysuria, trouble voiding, or hematuria Hematological and Lymphatic: negative Breast: negative for breast lumps Musculoskeletal: negative Remaining ROS negative.  Physical Exam:  Blood pressure 148/72, pulse 80, temperature 98.3 F (36.8 C), temperature source Oral, height 5\' 8"  (1.727 m), weight 255 lb 3.2 oz (115.758 kg).  ECOG: 0   Breasts: normal appearance, no masses or tenderness, rt breast with smaller seroma.   Lab Results: Lab Results  Component Value Date   WBC 10.6* 08/01/2011   HGB 12.0 08/01/2011   HCT 39.3 08/01/2011   MCV 93.3 08/01/2011   PLT 215 08/01/2011     Chemistry       Component Value Date/Time   NA 140 08/01/2011 1319   K 4.5 08/01/2011 1319   CL 101 08/01/2011 1319   CO2 29 08/01/2011 1319   BUN 30* 08/01/2011 1319   CREATININE 1.41* 08/01/2011 1319      Component Value Date/Time   CALCIUM 8.9 08/01/2011 1319   ALKPHOS 83 08/01/2011 1319   AST 20 08/01/2011 1319   ALT 15 08/01/2011 1319   BILITOT 1.1 08/01/2011 1319       Radiological Studies:  No results found.   IMPRESSIONS AND PLAN: A 78 y.o. female with   Hx t2 N0 breast cancer will start arimidex today and see in ~ 60months. She is doing well otherwise.  Spent more than half the time coordinating care.    Dima Ferrufino 4/9/20134:07 PM

## 2011-09-20 NOTE — Patient Instructions (Signed)
To take 2000 units of vitamin D3 per day

## 2011-09-21 ENCOUNTER — Telehealth: Payer: Self-pay | Admitting: *Deleted

## 2011-09-21 NOTE — Telephone Encounter (Signed)
left  voice message to inform the patient of the new date and time on 12-20-2011 lab only 12-22-2011 dr.[email or web address]

## 2011-09-26 ENCOUNTER — Other Ambulatory Visit: Payer: Self-pay | Admitting: Internal Medicine

## 2011-09-30 ENCOUNTER — Ambulatory Visit (INDEPENDENT_AMBULATORY_CARE_PROVIDER_SITE_OTHER): Payer: Medicare Other | Admitting: General Surgery

## 2011-09-30 ENCOUNTER — Encounter (INDEPENDENT_AMBULATORY_CARE_PROVIDER_SITE_OTHER): Payer: Self-pay | Admitting: General Surgery

## 2011-09-30 VITALS — BP 174/76 | HR 81 | Temp 97.1°F | Ht 66.0 in | Wt 234.2 lb

## 2011-09-30 DIAGNOSIS — Z9889 Other specified postprocedural states: Secondary | ICD-10-CM

## 2011-09-30 NOTE — Patient Instructions (Signed)
Remove dry bandage tomorrow in the shower the place a dry bandage over the wound, not in the wound, daily.

## 2011-09-30 NOTE — Progress Notes (Signed)
[name] presents to the urgent office today because of bleeding from her right breast wound.  She has had this problem before. She states she pulled the packing out of her wound (dry gauze) and then started having bleeding.  Exam: Right breast-lateral aspect of the wound is open with some mildly bleeding granulation tissue which responded to silver nitrate treatment.  Assessment: Slowly healing right partial mastectomy wound with intermittent bleeding most likely caused by removing dry dressing which she has been packing in the wound.  I repacked the wound with saline moistened gauze followed by a bulky dry dressing.  Plan: Remove packing and dry dressing tomorrow after taking a shower. Applied a dry dressing over the wound and not in the wound daily. Keep her next appointment with Dr. Hassell Done.

## 2011-10-03 ENCOUNTER — Ambulatory Visit: Payer: Medicare Other

## 2011-10-03 ENCOUNTER — Ambulatory Visit: Admission: RE | Admit: 2011-10-03 | Payer: Medicare Other | Source: Ambulatory Visit | Admitting: Radiation Oncology

## 2011-10-03 ENCOUNTER — Encounter: Payer: Self-pay | Admitting: Radiation Oncology

## 2011-10-03 NOTE — Progress Notes (Signed)
Patient did not get to have simulation today; however I did meet with patient to discuss RO billing.  She asked about cost and I told her I would get that information together for her simulation appt next week. 10/10/11 11:15am:  Spoke to [employer] @ [place] - patient's plan has a $35 copay (applied to annual OOP); radiation therapy is covered at 80% of allowables; annual OOP $3900, currently met $886.54 (remaining $3013.46).   Will provide this information to the patient today.

## 2011-10-10 ENCOUNTER — Ambulatory Visit
Admission: RE | Admit: 2011-10-10 | Discharge: 2011-10-10 | Disposition: A | Discharge status: Home / Self Care | Payer: Medicare Other | Source: Ambulatory Visit | Attending: Radiation Oncology | Admitting: Radiation Oncology

## 2011-10-10 ENCOUNTER — Ambulatory Visit: Payer: Medicare Other

## 2011-10-10 DIAGNOSIS — C50911 Malignant neoplasm of unspecified site of right female breast: Secondary | ICD-10-CM

## 2011-10-10 NOTE — Progress Notes (Signed)
Simulation/treatment planning note The patient was taken to the CT simulator. A custom neck mold was utilized on a custom breast board. Her right partial mastectomy scar is marked with a radiopaque wire. Her field borders were also more. It is noted that she still had a 1.5 cm wound along her lateral breast. Her breasts was scanned. An isocenter was chosen. I contoured her tumor bed, and right partial mastectomy scar. She was set up to medial and lateral right breast tangents. 2 separate multileaf collimators were designed to conform the field. She has a separation of over 30 cm, thus we are changing her to standard fractionation. I'm prescribing 4680 cGy in 26 sessions utilizing mixed energy photons. This be followed by a photon boost for a further 1400 cGy in 7 sessions, again utilizing mixed energy photons. I am prescribing a boost because of her 2 mm margins and delay in beginning her radiation therapy because of her delayed wound healing. An isodose plan and dosimetry are requested.

## 2011-10-13 ENCOUNTER — Encounter (INDEPENDENT_AMBULATORY_CARE_PROVIDER_SITE_OTHER): Payer: Medicare Other | Admitting: Surgery

## 2011-10-13 ENCOUNTER — Ambulatory Visit (INDEPENDENT_AMBULATORY_CARE_PROVIDER_SITE_OTHER): Payer: Medicare Other | Admitting: Surgery

## 2011-10-13 ENCOUNTER — Encounter (INDEPENDENT_AMBULATORY_CARE_PROVIDER_SITE_OTHER): Payer: Self-pay | Admitting: Surgery

## 2011-10-13 VITALS — BP 168/70 | HR 84 | Temp 96.8°F | Resp 20 | Ht 66.0 in | Wt 241.4 lb

## 2011-10-13 DIAGNOSIS — C50911 Malignant neoplasm of unspecified site of right female breast: Secondary | ICD-10-CM

## 2011-10-13 DIAGNOSIS — C50919 Malignant neoplasm of unspecified site of unspecified female breast: Secondary | ICD-10-CM

## 2011-10-13 NOTE — Progress Notes (Signed)
[name] 78 y.o.  Body mass index is 38.96 kg/(m^2).  Patient Active Problem List  Diagnoses  . POLYP, COLON  . DIABETES MELLITUS, TYPE II  . HYPERLIPIDEMIA  . GOUT  . ANXIETY  . PERIPHERAL NEUROPATHY  . HYPERTENSION  . CVA  . DIVERTICULOSIS, COLON  . IRRITABLE BOWEL, PREDOMINANTLY CONSTIPATION  . FATIGUE  . COLONIC POLYPS, HX OF  . TREMOR  . Bile duct calculus with nonacute cholecystitis  . Morbid obesity  . Preventative health care  . Shingles  . Breast cancer, right breast-lobular s/p lumpectomy and sentinel node biopsy  . Cancer of upper-outer quadrant of female breast    Allergies  Allergen Reactions  . Ace Inhibitors   . Atorvastatin     REACTION: myalgias  . Codeine   . Oxycodone-Acetaminophen     REACTION: confusion, fatigue  . Rosuvastatin     REACTION: leg weakness  . Simvastatin     REACTION: leg weakness  . Sulfonamide Derivatives     Past Surgical History  Procedure Date  . Abdominal hysterectomy   . Bile duct stent placement ~ 01/2011  . Pelvic bone tumor removal     twice in 2000's  . Colonic perforation repair ~ 2000  . Total hip arthroplasty 1980's    right  . Joint replacement   . Colon surgery     hole in intestine ,tumors?  . Tonsillectomy ~ 1946  . Cholecystectomy 03/23/11    lap chole   . Replacement total knee bilateral ~ 2008; ~ 2010    right; left  . Cataract extraction w/ intraocular lens  implant, bilateral 1990's  . Hemorrhoid surgery 2012  . Breast lumpectomy 06/29/11    right  . Breast lumpectomy 06/29/2011    Procedure: BREAST LUMPECTOMY WITH EXCISION OF SENTINEL NODE;  Surgeon: Pedro Earls, MD;  Location: [place];  Service: General;  Laterality: Right;  right sentinel node mapping,right sentinel node biopsy, needle localization right breast lumpectomy  . Incision and drainage breast abscess     right breast seroma   [name], MD, MD No diagnosis found.  Wound is healing in.  Beefy red granulation tissue  present.  Doing well  Return 6 weeks. [name], MD, [facility] [phone] beeper [phone]  10/13/2011 10:12 AM

## 2011-10-17 ENCOUNTER — Other Ambulatory Visit: Payer: Self-pay | Admitting: Internal Medicine

## 2011-10-24 ENCOUNTER — Ambulatory Visit: Admission: RE | Admit: 2011-10-24 | Payer: Medicare Other | Source: Ambulatory Visit | Admitting: Radiation Oncology

## 2011-10-24 ENCOUNTER — Encounter: Payer: Self-pay | Admitting: Radiation Oncology

## 2011-10-24 NOTE — Progress Notes (Signed)
Chart note:  [name] is seen today prior to her port films. Her lateral right breast wound is still granulating and we will wait at least another 10-14 days before proceeding with her port films and perhaps a week after that before beginning her radiation therapy. I do not want to start her until she has completely healed.

## 2011-10-25 ENCOUNTER — Ambulatory Visit: Payer: Medicare Other

## 2011-10-26 ENCOUNTER — Ambulatory Visit: Payer: Medicare Other

## 2011-10-27 ENCOUNTER — Ambulatory Visit: Payer: Medicare Other

## 2011-10-28 ENCOUNTER — Ambulatory Visit: Payer: Medicare Other

## 2011-10-31 ENCOUNTER — Ambulatory Visit: Payer: Medicare Other

## 2011-11-01 ENCOUNTER — Ambulatory Visit: Payer: Medicare Other

## 2011-11-02 ENCOUNTER — Ambulatory Visit: Payer: Medicare Other

## 2011-11-03 ENCOUNTER — Ambulatory Visit: Payer: Medicare Other

## 2011-11-04 ENCOUNTER — Ambulatory Visit: Admission: RE | Admit: 2011-11-04 | Payer: Medicare Other | Source: Ambulatory Visit

## 2011-11-04 ENCOUNTER — Ambulatory Visit: Payer: Medicare Other

## 2011-11-08 ENCOUNTER — Ambulatory Visit: Payer: Medicare Other

## 2011-11-09 ENCOUNTER — Ambulatory Visit: Payer: Medicare Other

## 2011-11-10 ENCOUNTER — Ambulatory Visit: Payer: Medicare Other

## 2011-11-11 ENCOUNTER — Ambulatory Visit: Payer: Medicare Other

## 2011-11-14 ENCOUNTER — Telehealth (INDEPENDENT_AMBULATORY_CARE_PROVIDER_SITE_OTHER): Payer: Self-pay | Admitting: General Surgery

## 2011-11-14 ENCOUNTER — Telehealth (INDEPENDENT_AMBULATORY_CARE_PROVIDER_SITE_OTHER): Payer: Self-pay

## 2011-11-14 ENCOUNTER — Ambulatory Visit: Payer: Medicare Other

## 2011-11-14 NOTE — Telephone Encounter (Signed)
Contacted the patient with an appt time. 11/17/12 @2 :30

## 2011-11-14 NOTE — Telephone Encounter (Signed)
The patient called because her wound is still draining and Dr Valere Dross delayed radiation until it is checked by Dr Hassell Done.  She has to call Dr Valere Dross next Monday so please let her know if you can get her in this week.

## 2011-11-15 ENCOUNTER — Ambulatory Visit: Payer: Medicare Other

## 2011-11-16 ENCOUNTER — Ambulatory Visit: Payer: Medicare Other

## 2011-11-16 ENCOUNTER — Ambulatory Visit (INDEPENDENT_AMBULATORY_CARE_PROVIDER_SITE_OTHER): Payer: Medicare Other | Admitting: Surgery

## 2011-11-16 ENCOUNTER — Encounter (INDEPENDENT_AMBULATORY_CARE_PROVIDER_SITE_OTHER): Payer: Self-pay | Admitting: Surgery

## 2011-11-16 VITALS — BP 184/90 | HR 92 | Temp 98.7°F | Resp 20 | Ht 66.0 in | Wt 247.0 lb

## 2011-11-16 DIAGNOSIS — C50919 Malignant neoplasm of unspecified site of unspecified female breast: Secondary | ICD-10-CM

## 2011-11-16 DIAGNOSIS — C50911 Malignant neoplasm of unspecified site of right female breast: Secondary | ICD-10-CM

## 2011-11-16 NOTE — Patient Instructions (Signed)
Wound care Apply neosporin cream nightly into the right breast wound

## 2011-11-16 NOTE — Progress Notes (Signed)
[name] 78 y.o.  Body mass index is 39.87 kg/(m^2).  Patient Active Problem List  Diagnoses  . POLYP, COLON  . DIABETES MELLITUS, TYPE II  . HYPERLIPIDEMIA  . GOUT  . ANXIETY  . PERIPHERAL NEUROPATHY  . HYPERTENSION  . CVA  . DIVERTICULOSIS, COLON  . IRRITABLE BOWEL, PREDOMINANTLY CONSTIPATION  . FATIGUE  . COLONIC POLYPS, HX OF  . TREMOR  . Bile duct calculus with nonacute cholecystitis  . Morbid obesity  . Preventative health care  . Shingles  . Breast cancer, right breast-lobular s/p lumpectomy and sentinel node biopsy  . Cancer of upper-outer quadrant of female breast    Allergies  Allergen Reactions  . Ace Inhibitors   . Atorvastatin     REACTION: myalgias  . Codeine   . Oxycodone-Acetaminophen     REACTION: confusion, fatigue  . Rosuvastatin     REACTION: leg weakness  . Simvastatin     REACTION: leg weakness  . Sulfonamide Derivatives     Past Surgical History  Procedure Date  . Abdominal hysterectomy   . Bile duct stent placement ~ 01/2011  . Pelvic bone tumor removal     twice in 2000's  . Colonic perforation repair ~ 2000  . Total hip arthroplasty 1980's    right  . Joint replacement   . Colon surgery     hole in intestine ,tumors?  . Tonsillectomy ~ 1946  . Cholecystectomy 03/23/11    lap chole   . Replacement total knee bilateral ~ 2008; ~ 2010    right; left  . Cataract extraction w/ intraocular lens  implant, bilateral 1990's  . Hemorrhoid surgery 2012  . Breast lumpectomy 06/29/11    right  . Breast lumpectomy 06/29/2011    Procedure: BREAST LUMPECTOMY WITH EXCISION OF SENTINEL NODE;  Surgeon: Pedro Earls, MD;  Location: [place];  Service: General;  Laterality: Right;  right sentinel node mapping,right sentinel node biopsy, needle localization right breast lumpectomy  . Incision and drainage breast abscess     right breast seroma   [name], MD, MD No diagnosis found.  This right breast cavity is slowly healing.  There is  beefy red tissue in a <1 cm deep opening in the lateral aspect of the right breast.  AgN03 applied.  Will see back in 2 weeks.  [name], MD, [facility] [phone] beeper [phone]  11/16/2011 1:55 PM

## 2011-11-17 ENCOUNTER — Ambulatory Visit: Payer: Medicare Other

## 2011-11-18 ENCOUNTER — Ambulatory Visit: Payer: Medicare Other

## 2011-11-18 ENCOUNTER — Encounter (INDEPENDENT_AMBULATORY_CARE_PROVIDER_SITE_OTHER): Payer: Medicare Other | Admitting: Surgery

## 2011-11-21 ENCOUNTER — Ambulatory Visit
Admission: RE | Admit: 2011-11-21 | Discharge: 2011-11-21 | Disposition: A | Discharge status: Home / Self Care | Payer: Medicare Other | Source: Ambulatory Visit | Attending: Radiation Oncology | Admitting: Radiation Oncology

## 2011-11-21 ENCOUNTER — Encounter: Payer: Self-pay | Admitting: Radiation Oncology

## 2011-11-21 ENCOUNTER — Ambulatory Visit: Payer: Medicare Other

## 2011-11-21 DIAGNOSIS — E785 Hyperlipidemia, unspecified: Secondary | ICD-10-CM | POA: Insufficient documentation

## 2011-11-21 DIAGNOSIS — Z90710 Acquired absence of both cervix and uterus: Secondary | ICD-10-CM | POA: Insufficient documentation

## 2011-11-21 DIAGNOSIS — Z86718 Personal history of other venous thrombosis and embolism: Secondary | ICD-10-CM | POA: Insufficient documentation

## 2011-11-21 DIAGNOSIS — Z8673 Personal history of transient ischemic attack (TIA), and cerebral infarction without residual deficits: Secondary | ICD-10-CM | POA: Insufficient documentation

## 2011-11-21 DIAGNOSIS — K219 Gastro-esophageal reflux disease without esophagitis: Secondary | ICD-10-CM | POA: Insufficient documentation

## 2011-11-21 DIAGNOSIS — Z510 Encounter for antineoplastic radiation therapy: Secondary | ICD-10-CM | POA: Insufficient documentation

## 2011-11-21 DIAGNOSIS — Z79899 Other long term (current) drug therapy: Secondary | ICD-10-CM | POA: Insufficient documentation

## 2011-11-21 DIAGNOSIS — I129 Hypertensive chronic kidney disease with stage 1 through stage 4 chronic kidney disease, or unspecified chronic kidney disease: Secondary | ICD-10-CM | POA: Insufficient documentation

## 2011-11-21 DIAGNOSIS — C50919 Malignant neoplasm of unspecified site of unspecified female breast: Secondary | ICD-10-CM | POA: Insufficient documentation

## 2011-11-21 DIAGNOSIS — N189 Chronic kidney disease, unspecified: Secondary | ICD-10-CM | POA: Insufficient documentation

## 2011-11-21 NOTE — Progress Notes (Signed)
Chart note: The patient was scheduled for port films today, but is seen prior to her films to assess her wound healing. She was seen by Dr. Hassell Done on June 5 and he noted granulating  tissue less than 1 cm deep. He applied silver nitrate. She's had minimal bleeding since then.  On examination today there is no change in her wound. There is no obvious infection.  Impression: I feel that we need to postpone initiation of her radiation therapy for least 2 more weeks. I will see her back for a wound check on June 20. I cannot start her radiation therapy until she is completely healed.

## 2011-11-22 ENCOUNTER — Ambulatory Visit: Payer: Medicare Other

## 2011-11-23 ENCOUNTER — Ambulatory Visit: Payer: Medicare Other

## 2011-11-24 ENCOUNTER — Ambulatory Visit: Payer: Medicare Other

## 2011-11-25 ENCOUNTER — Ambulatory Visit: Payer: Medicare Other

## 2011-11-28 ENCOUNTER — Ambulatory Visit: Payer: Medicare Other

## 2011-11-29 ENCOUNTER — Ambulatory Visit: Payer: Medicare Other

## 2011-11-30 ENCOUNTER — Ambulatory Visit: Payer: Medicare Other

## 2011-12-01 ENCOUNTER — Ambulatory Visit: Admission: RE | Admit: 2011-12-01 | Payer: Medicare Other | Source: Ambulatory Visit

## 2011-12-01 ENCOUNTER — Ambulatory Visit: Payer: Medicare Other

## 2011-12-01 ENCOUNTER — Encounter: Payer: Self-pay | Admitting: Radiation Oncology

## 2011-12-01 NOTE — Progress Notes (Signed)
Chart note: [name] visits today for a skin check. She is having little drainage from her right lateral breast wound. The wound continues to granulate and appears to be clean. It is approximately 2 cm in diameter. It is approximately 0.5-1.0 cm deep. She will return to see me the week of July 1 and will probably start her radiation therapy by Monday July 8.

## 2011-12-02 ENCOUNTER — Ambulatory Visit: Payer: Medicare Other

## 2011-12-05 ENCOUNTER — Ambulatory Visit: Payer: Medicare Other

## 2011-12-06 ENCOUNTER — Ambulatory Visit: Payer: Medicare Other

## 2011-12-07 ENCOUNTER — Ambulatory Visit: Payer: Medicare Other

## 2011-12-08 ENCOUNTER — Telehealth: Payer: Self-pay | Admitting: *Deleted

## 2011-12-08 ENCOUNTER — Encounter (INDEPENDENT_AMBULATORY_CARE_PROVIDER_SITE_OTHER): Payer: Medicare Other | Admitting: Surgery

## 2011-12-08 ENCOUNTER — Ambulatory Visit: Payer: Medicare Other

## 2011-12-08 NOTE — Telephone Encounter (Signed)
Spoke w/pt [id number] healing on her right lateral breast. She states "it is closing up, has very little light-colored drainage". Pt is not packing this wound, states it does not need it. Informed pt this RN will call her Monday morning for update; also informed her she currently has appt Tues, 12/13/11 at 3:50 pm. Pt verbalized understanding.

## 2011-12-09 ENCOUNTER — Ambulatory Visit: Payer: Medicare Other

## 2011-12-12 ENCOUNTER — Ambulatory Visit: Payer: Medicare Other

## 2011-12-12 ENCOUNTER — Telehealth: Payer: Self-pay | Admitting: *Deleted

## 2011-12-12 NOTE — Telephone Encounter (Signed)
Spoke w/pt this morning. She states she still has small amt drainage from her right lateral breast. She has appt w/Dr Hassell Done this Wed and stated she probably wouldn't do anything until after she sees him. Informed her will give this information to Dr Valere Dross and be in touch with her. Pt verbalized understanding.

## 2011-12-13 ENCOUNTER — Ambulatory Visit: Payer: Medicare Other

## 2011-12-14 ENCOUNTER — Ambulatory Visit: Payer: Medicare Other

## 2011-12-14 ENCOUNTER — Encounter (INDEPENDENT_AMBULATORY_CARE_PROVIDER_SITE_OTHER): Payer: Self-pay | Admitting: Surgery

## 2011-12-14 ENCOUNTER — Ambulatory Visit (INDEPENDENT_AMBULATORY_CARE_PROVIDER_SITE_OTHER): Payer: Medicare Other | Admitting: Surgery

## 2011-12-14 VITALS — BP 152/84 | HR 72 | Temp 97.9°F | Resp 16 | Ht 66.0 in | Wt 245.8 lb

## 2011-12-14 DIAGNOSIS — C50911 Malignant neoplasm of unspecified site of right female breast: Secondary | ICD-10-CM

## 2011-12-14 DIAGNOSIS — C50919 Malignant neoplasm of unspecified site of unspecified female breast: Secondary | ICD-10-CM

## 2011-12-14 NOTE — Progress Notes (Signed)
[name] 78 y.o.  Body mass index is 39.67 kg/(m^2).  Patient Active Problem List  Diagnosis  . POLYP, COLON  . DIABETES MELLITUS, TYPE II  . HYPERLIPIDEMIA  . GOUT  . ANXIETY  . PERIPHERAL NEUROPATHY  . HYPERTENSION  . CVA  . DIVERTICULOSIS, COLON  . IRRITABLE BOWEL, PREDOMINANTLY CONSTIPATION  . FATIGUE  . COLONIC POLYPS, HX OF  . TREMOR  . Bile duct calculus with nonacute cholecystitis  . Morbid obesity  . Preventative health care  . Shingles  . Breast cancer, right breast-lobular s/p lumpectomy and sentinel node biopsy  . Cancer of upper-outer quadrant of female breast    Allergies  Allergen Reactions  . Ace Inhibitors   . Atorvastatin     REACTION: myalgias  . Codeine   . Oxycodone-Acetaminophen     REACTION: confusion, fatigue  . Rosuvastatin     REACTION: leg weakness  . Simvastatin     REACTION: leg weakness  . Sulfonamide Derivatives     Past Surgical History  Procedure Date  . Abdominal hysterectomy   . Bile duct stent placement ~ 01/2011  . Pelvic bone tumor removal     twice in 2000's  . Colonic perforation repair ~ 2000  . Total hip arthroplasty 1980's    right  . Joint replacement   . Colon surgery     hole in intestine ,tumors?  . Tonsillectomy ~ 1946  . Cholecystectomy 03/23/11    lap chole   . Replacement total knee bilateral ~ 2008; ~ 2010    right; left  . Cataract extraction w/ intraocular lens  implant, bilateral 1990's  . Hemorrhoid surgery 2012  . Breast lumpectomy 06/29/11    right  . Breast lumpectomy 06/29/2011    Procedure: BREAST LUMPECTOMY WITH EXCISION OF SENTINEL NODE;  Surgeon: Pedro Earls, MD;  Location: [place];  Service: General;  Laterality: Right;  right sentinel node mapping,right sentinel node biopsy, needle localization right breast lumpectomy  . Incision and drainage breast abscess     right breast seroma   [name], MD No diagnosis found.  Her wound is now about 2 cm long and less than 1 cm deep.  The depths are beefy-red. I went ahead and put Steri-Strips on this wound will see her back in a week. Hopefully this will expedite healing and she can get on about her radiation therapy. She's been very patient with this although has been a long-time healing. [name], MD, [facility] [phone] beeper [phone]  12/14/2011 2:40 PM

## 2011-12-14 NOTE — Patient Instructions (Addendum)
Leave steri strips in place.   You may shower and pat the area dry.

## 2011-12-16 ENCOUNTER — Ambulatory Visit: Payer: Medicare Other

## 2011-12-19 ENCOUNTER — Telehealth: Payer: Self-pay | Admitting: *Deleted

## 2011-12-19 ENCOUNTER — Encounter: Payer: Self-pay | Admitting: Physician Assistant

## 2011-12-19 ENCOUNTER — Encounter (HOSPITAL_COMMUNITY): Payer: Self-pay

## 2011-12-19 ENCOUNTER — Telehealth (INDEPENDENT_AMBULATORY_CARE_PROVIDER_SITE_OTHER): Payer: Self-pay | Admitting: General Surgery

## 2011-12-19 ENCOUNTER — Inpatient Hospital Stay (HOSPITAL_COMMUNITY)
Admission: AD | Admit: 2011-12-19 | Discharge: 2011-12-21 | DRG: 378 | Disposition: A | Discharge status: Home / Self Care | Payer: Medicare Other | Source: Ambulatory Visit | Attending: Gastroenterology | Admitting: Gastroenterology

## 2011-12-19 ENCOUNTER — Ambulatory Visit: Payer: Medicare Other

## 2011-12-19 ENCOUNTER — Ambulatory Visit (INDEPENDENT_AMBULATORY_CARE_PROVIDER_SITE_OTHER): Payer: Medicare Other | Admitting: Physician Assistant

## 2011-12-19 ENCOUNTER — Telehealth: Payer: Self-pay | Admitting: Internal Medicine

## 2011-12-19 VITALS — BP 160/70 | HR 76 | Ht 66.0 in | Wt 250.0 lb

## 2011-12-19 DIAGNOSIS — D62 Acute posthemorrhagic anemia: Secondary | ICD-10-CM

## 2011-12-19 DIAGNOSIS — K922 Gastrointestinal hemorrhage, unspecified: Secondary | ICD-10-CM

## 2011-12-19 DIAGNOSIS — E785 Hyperlipidemia, unspecified: Secondary | ICD-10-CM | POA: Diagnosis present

## 2011-12-19 DIAGNOSIS — T8189XA Other complications of procedures, not elsewhere classified, initial encounter: Secondary | ICD-10-CM | POA: Diagnosis present

## 2011-12-19 DIAGNOSIS — K5731 Diverticulosis of large intestine without perforation or abscess with bleeding: Principal | ICD-10-CM

## 2011-12-19 DIAGNOSIS — C50919 Malignant neoplasm of unspecified site of unspecified female breast: Secondary | ICD-10-CM | POA: Diagnosis present

## 2011-12-19 DIAGNOSIS — I10 Essential (primary) hypertension: Secondary | ICD-10-CM | POA: Diagnosis present

## 2011-12-19 DIAGNOSIS — Z86010 Personal history of colonic polyps: Secondary | ICD-10-CM

## 2011-12-19 DIAGNOSIS — Y838 Other surgical procedures as the cause of abnormal reaction of the patient, or of later complication, without mention of misadventure at the time of the procedure: Secondary | ICD-10-CM | POA: Diagnosis present

## 2011-12-19 DIAGNOSIS — E119 Type 2 diabetes mellitus without complications: Secondary | ICD-10-CM | POA: Diagnosis present

## 2011-12-19 LAB — COMPREHENSIVE METABOLIC PANEL
ALT: 20 U/L (ref 0–35)
AST: 28 U/L (ref 0–37)
Albumin: 3.3 g/dL — ABNORMAL LOW (ref 3.5–5.2)
Alkaline Phosphatase: 102 U/L (ref 39–117)
BUN: 40 mg/dL — ABNORMAL HIGH (ref 6–23)
CO2: 29 mEq/L (ref 19–32)
Calcium: 9.6 mg/dL (ref 8.4–10.5)
Chloride: 103 mEq/L (ref 96–112)
Creatinine, Ser: 1.13 mg/dL — ABNORMAL HIGH (ref 0.50–1.10)
GFR calc Af Amer: 52 mL/min — ABNORMAL LOW (ref >90)
GFR calc non Af Amer: 45 mL/min — ABNORMAL LOW (ref >90)
Glucose, Bld: 132 mg/dL — ABNORMAL HIGH (ref 70–99)
Potassium: 4.6 mEq/L (ref 3.5–5.1)
Sodium: 141 mEq/L (ref 135–145)
Total Bilirubin: 0.5 mg/dL (ref 0.3–1.2)
Total Protein: 7.3 g/dL (ref 6.0–8.3)

## 2011-12-19 LAB — CBC
HCT: 41 % (ref 36.0–46.0)
Hemoglobin: 12.9 g/dL (ref 12.0–15.0)
MCH: 28.4 pg (ref 26.0–34.0)
MCHC: 31.5 g/dL (ref 30.0–36.0)
MCV: 90.3 fL (ref 78.0–100.0)
Platelets: 198 10*3/uL (ref 150–400)
RBC: 4.54 MIL/uL (ref 3.87–5.11)
RDW: 16.3 % — ABNORMAL HIGH (ref 11.5–15.5)
WBC: 8.4 10*3/uL (ref 4.0–10.5)

## 2011-12-19 LAB — PROTIME-INR
INR: 0.95 (ref 0.00–1.49)
Prothrombin Time: 12.9 seconds (ref 11.6–15.2)

## 2011-12-19 LAB — HEMOGLOBIN AND HEMATOCRIT, BLOOD
HCT: 36.6 % (ref 36.0–46.0)
Hemoglobin: 11.3 g/dL — ABNORMAL LOW (ref 12.0–15.0)

## 2011-12-19 MED ORDER — ALLOPURINOL 100 MG PO TABS
100.0000 mg | ORAL_TABLET | Freq: Every day | ORAL | Status: DC
  Administered 2011-12-19 – 2011-12-21 (×3): 100 mg via ORAL
  Filled 2011-12-19 (×3): qty 1

## 2011-12-19 MED ORDER — PANTOPRAZOLE SODIUM 40 MG IV SOLR
40.0000 mg | Freq: Two times a day (BID) | INTRAVENOUS | Status: DC
  Administered 2011-12-19 – 2011-12-21 (×5): 40 mg via INTRAVENOUS
  Filled 2011-12-19 (×6): qty 40

## 2011-12-19 MED ORDER — FUROSEMIDE 40 MG PO TABS
40.0000 mg | ORAL_TABLET | ORAL | Status: DC
  Administered 2011-12-20: 40 mg via ORAL
  Filled 2011-12-19: qty 1

## 2011-12-19 MED ORDER — ONDANSETRON HCL 4 MG PO TABS: 4.0000 mg | ORAL_TABLET | Freq: Four times a day (QID) | ORAL | Status: DC | PRN

## 2011-12-19 MED ORDER — HYDROCODONE-ACETAMINOPHEN 5-325 MG PO TABS: 1.0000 | ORAL_TABLET | ORAL | Status: DC | PRN

## 2011-12-19 MED ORDER — LISINOPRIL 40 MG PO TABS
40.0000 mg | ORAL_TABLET | Freq: Every day | ORAL | Status: DC
  Administered 2011-12-19 – 2011-12-21 (×3): 40 mg via ORAL
  Filled 2011-12-19 (×3): qty 1

## 2011-12-19 MED ORDER — ACETAMINOPHEN 650 MG RE SUPP: 650.0000 mg | Freq: Four times a day (QID) | RECTAL | Status: DC | PRN

## 2011-12-19 MED ORDER — ONDANSETRON HCL 4 MG/2ML IJ SOLN: 4.0000 mg | Freq: Four times a day (QID) | INTRAMUSCULAR | Status: DC | PRN

## 2011-12-19 MED ORDER — ACETAMINOPHEN 325 MG PO TABS: 650.0000 mg | ORAL_TABLET | Freq: Four times a day (QID) | ORAL | Status: DC | PRN

## 2011-12-19 MED ORDER — KCL IN DEXTROSE-NACL 10-5-0.45 MEQ/L-%-% IV SOLN
INTRAVENOUS | Status: DC
  Administered 2011-12-19 – 2011-12-20 (×4): via INTRAVENOUS
  Administered 2011-12-21: 1000 mL via INTRAVENOUS
  Filled 2011-12-19 (×7): qty 1000

## 2011-12-19 MED ORDER — AMLODIPINE BESYLATE 5 MG PO TABS
5.0000 mg | ORAL_TABLET | Freq: Every day | ORAL | Status: DC
  Administered 2011-12-19 – 2011-12-21 (×3): 5 mg via ORAL
  Filled 2011-12-19 (×3): qty 1

## 2011-12-19 NOTE — H&P (Signed)
Primary Care Physician:  [name], MD Primary Gastroenterologist:  Dr. Scarlette Shorts  CHIEF COMPLAINT:  Rectal bleeding  HPI: [name] is a 78 y.o. female known to Dr. Scarlette Shorts with history of diverticulosis, colon polyps, adult onset diabetes mellitus, hyperlipidemia, hypertension, and gout. She was diagnosed with breast cancer earlier this year and had a lumpectomy on the right. She is having difficulty healing her wounds from the lumpectomy and still has an open wound and therefore has not started radiation.   She says she had a remote light CVA prior to starting aspirin therapy which she takes regularly now. She last had colonoscopy in August of 2000-12 which was done for minor complaints of rectal bleeding and was noted to have moderate diverticulosis throughout her colon as well as internal neck terminal hemorrhoids but no recurrent polyps. Patient says she had onset of bleeding about 3 days ago which he initially thought was hemorrhoidal as she was noticing dark red blood mixed with her bowel movements. She says she was only having one bowel movement per day and had no complaints of rectal pain discomfort or O'Donnell pain. This morning she awakened and had 3 bowel movements at all with a lot of blood clots and dark red  stool She says it looked like black cherries. She says over the past couple of weeks she has noticed occasional jabbing discomfort in her epigastric area but has not been persistent. She has had some vague nausea the past couple of days no vomiting. She does take 1 325 mg aspirin daily no other NSAIDs or blood thinners. She says she feels a bit weak today but has not been lightheaded. She is status post cholecystectomy resection of uterine fibroids and had sustained a remote perforated colon per Dr. Earlean Shawl and had a bowel resection because of that. Her surgeries were done by Dr. Hassell Done.   Past Medical History  Diagnosis Date  . Hypertension   . Hyperlipidemia   .  Morbid obesity   . Osteoarthritis   . Gout   . CVA (cerebral infarction)   . IBS (irritable bowel syndrome)   . Diverticulosis   . Esophageal stricture   . Colon polyps     hyperplastic  . Anxiety   . Hiatal hernia     4 cm  . Shingles 02/21/2011  . Skin cancer   . Breast cancer     right breast  . Dysrhythmia     dr bensimhon   . Blood transfusion   . Chronic kidney disease     occ uti's  . GERD (gastroesophageal reflux disease)   . PONV (postoperative nausea and vomiting)   . DVT of leg (deep venous thrombosis)     left; S/P OR  . Atrial flutter     "sometimes"  . Bronchitis   . Pneumonia     "walking"  . Shortness of breath on exertion   . Anemia   . Stroke 2002    residual:  "little tingling in palm of left hand"  . Kidney stones 1990's  . UTI (lower urinary tract infection)     "I've had a few"    Past Surgical History  Procedure Date  . Abdominal hysterectomy   . Bile duct stent placement ~ 01/2011  . Pelvic bone tumor removal     twice in 2000's  . Colonic perforation repair ~ 2000  . Total hip arthroplasty 1980's    right  . Joint replacement   . Colon  surgery     hole in intestine ,tumors?  . Tonsillectomy ~ 1946  . Cholecystectomy 03/23/11    lap chole   . Replacement total knee bilateral ~ 2008; ~ 2010    right; left  . Cataract extraction w/ intraocular lens  implant, bilateral 1990's  . Hemorrhoid surgery 2012  . Breast lumpectomy 06/29/11    right  . Breast lumpectomy 06/29/2011    Procedure: BREAST LUMPECTOMY WITH EXCISION OF SENTINEL NODE;  Surgeon: Pedro Earls, MD;  Location: [place];  Service: General;  Laterality: Right;  right sentinel node mapping,right sentinel node biopsy, needle localization right breast lumpectomy  . Incision and drainage breast abscess     right breast seroma    Prior to Admission medications   Medication Sig Start Date End Date Taking? Authorizing Provider  amLODipine (NORVASC) 5 MG tablet Take 5 mg by  mouth daily.     Yes Historical Provider, MD  aspirin 325 MG tablet Take 325 mg by mouth daily.     Yes Historical Provider, MD  allopurinol (ZYLOPRIM) 100 MG tablet Take 100-200 mg by mouth every other day. Takes 1 tablet every other day then takes 2 tablets every other day alternating    Historical Provider, MD  cholecalciferol (VITAMIN D) 1000 UNITS tablet Take 1,000 Units by mouth daily.    Historical Provider, MD  furosemide (LASIX) 40 MG tablet Take 40 mg by mouth daily.    Historical Provider, MD  lisinopril (PRINIVIL,ZESTRIL) 40 MG tablet Take 40 mg by mouth daily.    Historical Provider, MD    No current facility-administered medications for this visit.   No current outpatient prescriptions on file.   Facility-Administered Medications Ordered in Other Visits  Medication Dose Route Frequency Provider Last Rate Last Dose  . acetaminophen (TYLENOL) tablet 650 mg  650 mg Oral Q6H PRN Ronelle Michie S Hershal Eriksson, PA       Or  . acetaminophen (TYLENOL) suppository 650 mg  650 mg Rectal Q6H PRN Chrisandra Wiemers S Jamaiyah Pyle, PA      . allopurinol (ZYLOPRIM) tablet 100 mg  100 mg Oral Daily Yigit Norkus S Landy Mace, PA   100 mg at 12/19/11 1319  . amLODipine (NORVASC) tablet 5 mg  5 mg Oral Daily Aleza Pew S Axton Cihlar, PA   5 mg at 12/19/11 1319  . dextrose 5 % and 0.45 % NaCl with KCl 10 mEq/L infusion   Intravenous Continuous Ketzia Guzek S Yochanan Eddleman, PA 100 mL/hr at 12/19/11 1319    . furosemide (LASIX) tablet 40 mg  40 mg Oral QODAY Destyn Parfitt S Jaylenne Hamelin, PA      . HYDROcodone-acetaminophen (NORCO) 5-325 MG per tablet 1-2 tablet  1-2 tablet Oral Q4H PRN Samarra Ridgely S Anitra Doxtater, PA      . lisinopril (PRINIVIL,ZESTRIL) tablet 40 mg  40 mg Oral Daily Willia Craze, NP   40 mg at 12/19/11 1524  . ondansetron (ZOFRAN) tablet 4 mg  4 mg Oral Q6H PRN Candid Bovey S Joakim Huesman, PA       Or  . ondansetron (ZOFRAN) injection 4 mg  4 mg Intravenous Q6H PRN Mavric Cortright S Lavell Ridings, PA      . pantoprazole (PROTONIX) injection 40 mg  40 mg Intravenous Q12H Latoi Giraldo S Kalah Pflum, PA    40 mg at 12/19/11 1524    Allergies as of 12/19/2011 - Review Complete 12/19/2011  Allergen Reaction Noted  . Ace inhibitors Other (See Comments) 06/29/2011  . Atorvastatin  08/18/2010  . Oxycodone-acetaminophen  04/08/2008  . Rosuvastatin  10/17/2007  .  Simvastatin  10/17/2007  . Sulfonamide derivatives  02/10/2007  . Codeine Nausea And Vomiting and Rash 02/10/2007    Family History  Problem Relation Age of Onset  . Breast cancer Sister     x 2  . Diabetes Sister   . Prostate cancer Brother   . Colon cancer Neg Hx   . Stroke Mother   . Kidney failure Sister   . Heart disease Paternal Uncle     multiple  . Heart disease Maternal Uncle     multiple    History   Social History  . Marital Status: Married    Spouse Name: N/A    Number of Children: N/A  . Years of Education: N/A   Occupational History  . hairdresser    Social History Main Topics  . Smoking status: Never Smoker   . Smokeless tobacco: Never Used  . Alcohol Use: No  . Drug Use: No  . Sexually Active: No   Other Topics Concern  . Not on file   Social History Narrative  . No narrative on file    Review of Systems: Pertinent positive and negative review of systems were noted in the above HPI section.  All other review of systems was otherwise negative.  Physical Exam: Vital signs in last 24 hours: @VSRANGES @   General:   Alert,  Well-developed, elderly white female who ambulates with a walker, pleasant and cooperative in NAD Head:  Normocephalic and atraumatic. Eyes:  Sclera clear, no icterus.   Conjunctiva pink. Ears:  Normal auditory acuity. Nose:  No deformity, discharge,  or lesions. Mouth:  No deformity or lesions.  Oropharynx pink & moist. Neck:  Supple; no masses or thyromegaly. Lungs:  Clear throughout to auscultation.   No wheezes, crackles, or rhonchi. No acute distress. Patient showed me her open wound from her lumpectomy on the right breast Heart:  Regular rate and rhythm; no  murmurs, clicks, rubs,  or gallops. Abdomen:  Soft, mildly tender left mid quadrant suprapubic area and epigastrium ,and nondistended. No masses, hepatosplenomegaly or hernias noted. Normal bowel sounds, without guarding, and without rebound.   Rectal:  Copious maroon stool in the rectal vault- dark red   Msk:  Symmetrical without gross deformities. Normal posture. Pulses:  Normal pulses noted. Extremities:  Without clubbing or edema. Neurologic:  Alert and  oriented x4;  grossly normal neurologically. Skin:  Intact without significant lesions or rashes.  Psych:  Alert and cooperative. Normal mood and affect.  Intake/Output from previous day:   Intake/Output this shift: @IOTHISSHIFT @  Lab Results:  Basename 12/19/11 1345  WBC 8.4  HGB 12.9  HCT 41.0  PLT 198   BMET  Basename 12/19/11 1345  NA 141  K 4.6  CL 103  CO2 29  GLUCOSE 132*  BUN 40*  CREATININE 1.13*  CALCIUM 9.6   LFT  Basename 12/19/11 1345  PROT 7.3  ALBUMIN 3.3*  AST 28  ALT 20  ALKPHOS 102  BILITOT 0.5  BILIDIR --  IBILI --   PT/INR  Basename 12/19/11 1345  LABPROT 12.9  INR 0.95   Hepatitis Panel No results found for this basename: HEPBSAG,HCVAB,HEPAIGM,HEPBIGM in the last 72 hours  Studies/Results: No results found.  Impression / Plan:  #57 78 year old female with acute GI bleed probably lower most likely diverticular in origin area cannot absolutely rule out an upper GI bleed with chronic aspirin use though this is felt less likely. She is currently hemodynamically stable #2 history of breast cancer  status post right lumpectomy 2013, with nonhealing incision, being followed by Dr. Hassell Done #3 gout #4 history of hypertension #5 remote history of small CVA, maintained on aspirin  Plan; patient is admitted to the GI service for IV fluid hydration serial hemoglobins and transfusions as indicated. She'll be placed on a clear liquid diet. Hold her aspirin and short-term Since patient had  colonoscopy within the past year hopefully she will not need repeat exam unless she has persistent active bleeding and this is done for therapeutic /hemostasis purposes.      @RRHLOS @  Kyrra Prada  12/19/2011, 5:07 PM

## 2011-12-19 NOTE — Telephone Encounter (Signed)
Per Dr Valere Dross, called pt to have her come in today to be seen by Dr Valere Dross. Left vm at home and cell phone #; left callback #. Notified Faith, RT , linac #3 that pt on break until further notice by Dr Valere Dross.

## 2011-12-19 NOTE — H&P (Signed)
GI ATTENDING  78 year old female familiar to me. Agree with H&P as outlined by Nicoletta Ba, PA-C in her extensive admissions note. Please see. Admitted for probable diverticular bleed. Stable. Plans as outlined.  Docia Chuck. Geri Seminole., M.D. [facility] Division of Gastroenterology

## 2011-12-19 NOTE — Telephone Encounter (Signed)
Pt states she has been having some rectal bleeding and that her stool today is black. She thinks she may have an ulcer also. Requesting to be seen. Pt scheduled to see Nicoletta Ba PA today at 11am. Pt aware of appt date and time.

## 2011-12-19 NOTE — Telephone Encounter (Signed)
Patient called in stating that she has bleeding ulcers and possible hemorrhoids. Patient stated this morning she had large clots and black stools this morning. Patient stated she is also having back pain. I advised the patient that we do not treat ulcers here at our facility and that she needs to contact her GI doctor to advise of what has been going on pertaining to her ulcers as that sounds like that is the source of the dark stool and blood clots. Patient agreed.

## 2011-12-19 NOTE — Progress Notes (Signed)
Patient ID: Bonnetta Barry, female   DOB: 04-21-1930, 78 y.o.   MRN: [id number]  [name] is a pleasant 78 year old female known to Dr. Henrene Pastor who was seen in the office today as an urgent add-on with complaints of rectal bleeding. She had colonoscopy done in August of 2012 for some minor complaints of bleeding and was noted to have moderate diverticulosis throughout the colon as well as internal and external hemorrhoids. Patient says she had onset of bleeding about 3 days ago which she initially thought was hemorrhoidal. She says she was seen dark and red blood mixed with her bowel movements but was only having one bowel movement per day. Today early this morning after she woke up she had 3 large bowel movements all with a lot of blood clots and dark black red stool which she says reminded her of "black cherries". She has had some associated abdominal discomfort in her upper abdomen over the past couple of weeks. She does take 1 baby aspirin per day no other NSAIDs or blood thinners. On rectal exam she is noted to have grossly bloody dark maroonish appearing stool. She is admitted to the hospital with an acute GI bleed probably diverticular in origin. For details please see admission H&P

## 2011-12-20 ENCOUNTER — Ambulatory Visit: Payer: Medicare Other

## 2011-12-20 ENCOUNTER — Encounter (INDEPENDENT_AMBULATORY_CARE_PROVIDER_SITE_OTHER): Payer: Medicare Other | Admitting: Surgery

## 2011-12-20 DIAGNOSIS — D62 Acute posthemorrhagic anemia: Secondary | ICD-10-CM

## 2011-12-20 DIAGNOSIS — K5731 Diverticulosis of large intestine without perforation or abscess with bleeding: Principal | ICD-10-CM

## 2011-12-20 DIAGNOSIS — K922 Gastrointestinal hemorrhage, unspecified: Secondary | ICD-10-CM

## 2011-12-20 LAB — CBC
HCT: 39.4 % (ref 36.0–46.0)
Hemoglobin: 12.4 g/dL (ref 12.0–15.0)
MCH: 28.3 pg (ref 26.0–34.0)
MCHC: 31.5 g/dL (ref 30.0–36.0)
MCV: 90 fL (ref 78.0–100.0)
Platelets: 177 10*3/uL (ref 150–400)
RBC: 4.38 MIL/uL (ref 3.87–5.11)
RDW: 16.2 % — ABNORMAL HIGH (ref 11.5–15.5)
WBC: 7.1 10*3/uL (ref 4.0–10.5)

## 2011-12-20 LAB — HEMOGLOBIN AND HEMATOCRIT, BLOOD
HCT: 36.5 % (ref 36.0–46.0)
Hemoglobin: 11.1 g/dL — ABNORMAL LOW (ref 12.0–15.0)

## 2011-12-20 NOTE — Progress Notes (Signed)
Agree with assessment and plans 

## 2011-12-20 NOTE — Progress Notes (Signed)
New diet order received from Wallace Going PA.

## 2011-12-20 NOTE — Progress Notes (Signed)
I have taken an interval history, reviewed the chart and examined the patient. I agree with the extender's note, impression and recommendations.   Yazmine Sorey T. Mkayla Steele MD FACG 

## 2011-12-20 NOTE — Progress Notes (Signed)
[facility] Progress Note  SUBJECTIVE: feels okay, bleeding has slowed down. She complains of gas but no abdominal pain. No nausea. Tolerating clears   OBJECTIVE:  Vital signs in last 24 hours: Temp:  [97.9 F (36.6 C)-98.4 F (36.9 C)] 98.1 F (36.7 C) (07/09 0530) Pulse Rate:  [75-84] 75  (07/09 0530) Resp:  [17-20] 17  (07/09 0530) BP: (134-164)/(70-77) 134/76 mmHg (07/09 0530) SpO2:  [93 %-96 %] 93 % (07/09 0530) Weight:  [245 lb (111.131 kg)-250 lb (113.399 kg)] 245 lb (111.131 kg) (07/08 1215) Last BM Date: 12/19/11 General:   pleasant white female in NAD Heart:  Regular rate and rhythm Lungs: Respirations even and unlabored, lungs CTA bilaterally Abdomen:  Soft, obese, nontender, normal bowel sounds. Extremities:  Without edema. Neurologic:  Alert and oriented,  grossly normal neurologically. Psych:  Cooperative. Normal mood and affect.  I   Lab Results:  Basename 12/20/11 0415 12/19/11 2046 12/19/11 1345  WBC -- -- 8.4  HGB 11.1* 11.3* 12.9  HCT 36.5 36.6 41.0  PLT -- -- 198   BMET  Basename 12/19/11 1345  NA 141  K 4.6  CL 103  CO2 29  GLUCOSE 132*  BUN 40*  CREATININE 1.13*  CALCIUM 9.6   LFT  Basename 12/19/11 1345  PROT 7.3  ALBUMIN 3.3*  AST 28  ALT 20  ALKPHOS 102  BILITOT 0.5  BILIDIR --  IBILI --   PT/INR  Basename 12/19/11 1345  LABPROT 12.9  INR 0.95     ASSESSMENT / PLAN:  1. GI bleed with burgundy colored stools. Suspect diverticular hemorrhage. Her BUN was 40 yesterday (out of proportion to creatinine) but doubt upper based on clinical assessment.  Minor bleeding through the night. This am has had two loose stools with small amount of burgandy blood. Hemoglobin down from 12.9 to 11.1 overnight. She has been hemodynamically stable. Continue to monitor. Continue clears for now. Check am labs.  2.  Hypertension, BP 134/76 today. Home Lisinopril continued.      LOS: 1 day   Tye Savoy  12/20/2011, 9:15  AM

## 2011-12-21 ENCOUNTER — Ambulatory Visit: Payer: Medicare Other

## 2011-12-21 LAB — CBC
HCT: 37.9 % (ref 36.0–46.0)
Hemoglobin: 11.7 g/dL — ABNORMAL LOW (ref 12.0–15.0)
MCH: 27.9 pg (ref 26.0–34.0)
MCHC: 30.9 g/dL (ref 30.0–36.0)
MCV: 90.5 fL (ref 78.0–100.0)
Platelets: 198 10*3/uL (ref 150–400)
RBC: 4.19 MIL/uL (ref 3.87–5.11)
RDW: 16.2 % — ABNORMAL HIGH (ref 11.5–15.5)
WBC: 7.3 10*3/uL (ref 4.0–10.5)

## 2011-12-21 NOTE — Progress Notes (Signed)
[facility] Progress Note  SUBJECTIVE: No further bleeding, feels okay  OBJECTIVE:  Vital signs in last 24 hours: Temp:  [97.6 F (36.4 C)-98.4 F (36.9 C)] 97.6 F (36.4 C) (07/10 0445) Pulse Rate:  [68-79] 72  (07/10 0445) Resp:  [15-18] 15  (07/10 0445) BP: (137-154)/(67-75) 149/75 mmHg (07/10 0445) SpO2:  [93 %-95 %] 93 % (07/10 0445) Last BM Date: 12/21/11 General:    Pleasant white female in NAD Heart:  Regular rate and rhythm Lungs: Respirations even and unlabored, lungs CTA bilaterally Abdomen:  Soft, nontender and nondistended. Normal bowel sounds. Neurologic:  Alert and oriented,  grossly normal neurologically. Psych:  Cooperative. Normal mood and affect.  Lab Results:  Basename 12/21/11 0340 12/20/11 1513 12/20/11 0415 12/19/11 1345  WBC 7.3 7.1 -- 8.4  HGB 11.7* 12.4 11.1* --  HCT 37.9 39.4 36.5 --  PLT 198 177 -- 198   BMET  Basename 12/19/11 1345  NA 141  K 4.6  CL 103  CO2 29  GLUCOSE 132*  BUN 40*  CREATININE 1.13*  CALCIUM 9.6   LFT  Basename 12/19/11 1345  PROT 7.3  ALBUMIN 3.3*  AST 28  ALT 20  ALKPHOS 102  BILITOT 0.5  BILIDIR --  IBILI --   PT/INR  Basename 12/19/11 1345  LABPROT 12.9  INR 0.95     ASSESSMENT / PLAN:  GI bleed with burgundy colored stools, resolved. Suspect this was a low volume diverticular hemorrhage. Hemoglobin has risen from 11.78 yo 11.7 today. Patient is stable for discharge.      LOS: 2 days   Tye Savoy  12/21/2011, 9:13 AM

## 2011-12-21 NOTE — Progress Notes (Signed)
I have taken an interval history, reviewed the chart and examined the patient. I agree with the extender's note, impression and recommendations.   Nakyia Dau T. Traver Meckes MD FACG 

## 2011-12-21 NOTE — Discharge Summary (Signed)
[facility] Discharge Summary  Name: [name] MRN: [id number] DOB: 03/25/1930 78 y.o. PCP:  [name], MD  Date of Admission: 12/19/2011 12:00 PM Date of Discharge: 12/21/2011 Attending Physician: Ladene Artist, MD,FACG Primary Gastroenterologist: Scarlette Shorts, MD Discharging Physician: Lucio Edward, MD  Discharge Diagnosis: 1. GI bleed, presumed diverticular hemorrhage. Resolved.  2. Mild anemia of acute blood loss, stable. 3. Non-healing right breast wound at site of previous lumpectomy. Followed by Dr. Hassell Done.  Consultations: none this admission  Procedures Performed:  No results found.  GI Procedures: none this admission  History/Physical Exam:  See Admission H&P  Admission HPI: Done by [name], P.A.-C.   [name] is a 78 y.o. female known to Dr. Scarlette Shorts with history of diverticulosis, colon polyps, adult onset diabetes mellitus, hyperlipidemia, hypertension, and gout. She was diagnosed with breast cancer earlier this year and had a lumpectomy on the right. She is having difficulty healing her wounds from the lumpectomy and still has an open wound and therefore has not started radiation.  She says she had a remote light CVA prior to starting aspirin therapy which she takes regularly now. She last had colonoscopy in August of 2000-12 which was done for minor complaints of rectal bleeding and was noted to have moderate diverticulosis throughout her colon as well as internal neck terminal hemorrhoids but no recurrent polyps. Patient says she had onset of bleeding about 3 days ago which he initially thought was hemorrhoidal as she was noticing dark red blood mixed with her bowel movements. She says she was only having one bowel movement per day and had no complaints of rectal pain discomfort or O'Donnell pain. This morning she awakened and had 3 bowel movements at all with a lot of blood clots and dark red stool She says it looked like black cherries. She  says over the past couple of weeks she has noticed occasional jabbing discomfort in her epigastric area but has not been persistent. She has had some vague nausea the past couple of days no vomiting. She does take 1 325 mg aspirin daily no other NSAIDs or blood thinners. She says she feels a bit weak today but has not been lightheaded.  She is status post cholecystectomy resection of uterine fibroids and had sustained a remote perforated colon per Dr. Earlean Shawl and had a bowel resection because of that. Her surgeries were done by Dr. Hassell Done.   Hospital Course by problem list: 1. GI bleed, presumed diverticular hemorrhage. Patient presented to office on day of admission with complaints of hematochezia. She had gross blood on digital rectal exam. Patient was admitted to [facility] for further evaluation and treatment. On admission her hemoglobin was 12.9. She had a small amount of burgundy colored stool the night of admission and hemoglobin fell to 11.1. Patient had no further bleeding and she was discharged the following day.   2. Breast cancer, s/p right lumpectomy and sentinel node biopsy.   3. Non-healing right breast wound at lumpectomy site. Patient will follow up with Dr. Hassell Done who has been monitoring the wound.   4. Mild anemia of acute blood loss, hemoglobin on admission was 12.9   Nadir hemoglobin 11.1 this admission, no blood transfusion required. Hemoglobin 11.7 on day of discharge.   Discharge Vitals:  BP 149/75  Pulse 72  Temp 97.6 F (36.4 C) (Oral)  Resp 15  Ht 5\' 6"  (1.676 m)  Wt 245 lb (111.131 kg)  BMI 39.54 kg/m2  SpO2 93%  Discharge Labs:  Results for orders placed during the hospital encounter of 12/19/11 (from the past 24 hour(s))  CBC     Status: Abnormal   Collection Time   12/20/11  3:13 PM      Component Value Range   WBC 7.1  4.0 - 10.5 K/uL   RBC 4.38  3.87 - 5.11 MIL/uL   Hemoglobin 12.4  12.0 - 15.0 g/dL   HCT 39.4  36.0 - 46.0 %   MCV 90.0  78.0 - 100.0 fL     MCH 28.3  26.0 - 34.0 pg   MCHC 31.5  30.0 - 36.0 g/dL   RDW 16.2 (*) 11.5 - 15.5 %   Platelets 177  150 - 400 K/uL  CBC     Status: Abnormal   Collection Time   12/21/11  3:40 AM      Component Value Range   WBC 7.3  4.0 - 10.5 K/uL   RBC 4.19  3.87 - 5.11 MIL/uL   Hemoglobin 11.7 (*) 12.0 - 15.0 g/dL   HCT 37.9  36.0 - 46.0 %   MCV 90.5  78.0 - 100.0 fL   MCH 27.9  26.0 - 34.0 pg   MCHC 30.9  30.0 - 36.0 g/dL   RDW 16.2 (*) 11.5 - 15.5 %   Platelets 198  150 - 400 K/uL    Disposition and follow-up:   [name] was discharged from [facility] in stable condition.    Follow-up Appointments: Discharge Orders    Future Appointments: Provider: Department: Dept Phone: Center:   12/22/2011 11:00 AM Theda Sers [facility] [id number] None   12/29/2011 12:00 PM [name], MD [facility] [id number] None   01/02/2012 3:50 PM Rexene Edison, MD [facility] [id number] None     Joint Appt [facility] [phone] None   01/03/2012 3:50 PM [facility] [phone] None   01/04/2012 3:50 PM [facility] [phone] None   01/05/2012 3:50 PM [facility] [phone] None   01/06/2012 3:50 PM [facility] [id number] None   01/09/2012 3:50 PM [facility] [id number] None   01/10/2012 3:50 PM [facility] [id number] None   01/11/2012 3:50 PM [facility] [id number] None   01/12/2012 3:50 PM [facility] [id number] None   01/13/2012 3:50 PM [facility] [id number] None   01/16/2012 3:50 PM [facility] [id number] None   01/17/2012 3:50 PM [facility] [id number] None   01/18/2012 3:50 PM [facility] [id number] None    01/19/2012 3:50 PM [facility] [id number] None   01/20/2012 3:50 PM [facility] [phone] None   01/23/2012 3:50 PM [facility] [id number] None   01/24/2012 3:50 PM [facility] [id number] None   01/25/2012 3:50 PM [facility] [id number] None   01/26/2012 3:50 PM [facility] [id number] None   01/27/2012 3:50 PM [facility] [phone] None   01/30/2012 3:50 PM [facility] [phone] None   01/31/2012 3:50 PM [facility] [phone] None   02/01/2012 3:50 PM [facility] [id number] None   02/02/2012 3:50 PM [facility] [id number] None  02/03/2012 3:50 PM [facility] [id number] None   02/06/2012 3:50 PM [facility] [id number] None   02/07/2012 3:50 PM [facility] [id number] None   02/08/2012 3:50 PM [facility] [id number] None   02/09/2012 3:50 PM [facility] [id number] None   02/10/2012 3:50 PM [facility] [id number] None   02/14/2012 3:50 PM [facility] [id number] None   02/15/2012 3:50 PM [facility] [id number] None   02/16/2012 3:50 PM [facility] [id number] None   02/20/2012 2:30 PM Biagio Borg, MD [facility] [phone] [facility]      Discharge Medications: Medication List  As of 12/21/2011 10:42 AM   ASK your doctor about these medications         allopurinol 100 MG tablet   Commonly known as: ZYLOPRIM   Take 100-200 mg by mouth every other day. Takes 1 tablet every other day then takes 2 tablets every other day alternating      amLODipine 5 MG  tablet   Commonly known as: NORVASC   Take 5 mg by mouth daily.      aspirin 325 MG tablet   Take 325 mg by mouth daily.      cholecalciferol 1000 UNITS tablet   Commonly known as: VITAMIN D   Take 1,000 Units by mouth daily.      furosemide 40 MG tablet   Commonly known as: LASIX   Take 40 mg by mouth daily.      lisinopril 40 MG tablet   Commonly known as: PRINIVIL,ZESTRIL   Take 40 mg by mouth daily.            Signed: Tye Savoy 12/21/2011, 10:42 AM

## 2011-12-22 ENCOUNTER — Ambulatory Visit: Payer: Medicare Other

## 2011-12-22 ENCOUNTER — Other Ambulatory Visit: Payer: Medicare Other | Admitting: Lab

## 2011-12-23 ENCOUNTER — Ambulatory Visit: Payer: Medicare Other

## 2011-12-23 LAB — TYPE AND SCREEN
ABO/RH(D): O NEG
Antibody Screen: POSITIVE
DAT, IgG: NEGATIVE
Unit division: 0
Unit division: 0

## 2011-12-26 ENCOUNTER — Ambulatory Visit: Payer: Medicare Other

## 2011-12-26 NOTE — Discharge Summary (Signed)
I have taken an interval history, reviewed the chart and examined the patient. I agree with the extender's note, impression and recommendations.   Jasmaine Rochel T. Virgilene Stryker MD FACG 

## 2011-12-27 ENCOUNTER — Ambulatory Visit: Payer: Medicare Other

## 2011-12-27 ENCOUNTER — Ambulatory Visit (INDEPENDENT_AMBULATORY_CARE_PROVIDER_SITE_OTHER): Payer: Medicare Other | Admitting: General Surgery

## 2011-12-27 DIAGNOSIS — Z5189 Encounter for other specified aftercare: Secondary | ICD-10-CM

## 2011-12-27 NOTE — Patient Instructions (Addendum)
Patient instructed to keep area clean and pat dry.  Allow steri strips to fall off themselves.  Contact the office if area starts to drain.

## 2011-12-27 NOTE — Progress Notes (Signed)
6 steri strips placed on the lateral aspect of the patients right breast. No drainage, the wound is pink and filling in since her last visit. The area was dressed with a bandage and the patient was informed to contact the office if she notices any drainage.

## 2011-12-28 ENCOUNTER — Ambulatory Visit: Payer: Medicare Other

## 2011-12-29 ENCOUNTER — Ambulatory Visit: Payer: Medicare Other

## 2011-12-29 ENCOUNTER — Telehealth: Payer: Self-pay | Admitting: Oncology

## 2011-12-29 ENCOUNTER — Ambulatory Visit (HOSPITAL_BASED_OUTPATIENT_CLINIC_OR_DEPARTMENT_OTHER): Payer: Medicare Other | Admitting: Oncology

## 2011-12-29 VITALS — BP 161/68 | HR 80 | Temp 98.0°F | Ht 66.0 in | Wt 247.1 lb

## 2011-12-29 DIAGNOSIS — C50419 Malignant neoplasm of upper-outer quadrant of unspecified female breast: Secondary | ICD-10-CM

## 2011-12-29 DIAGNOSIS — C50911 Malignant neoplasm of unspecified site of right female breast: Secondary | ICD-10-CM

## 2011-12-29 MED ORDER — NYSTATIN 100000 UNIT/GM EX POWD: Freq: Four times a day (QID) | CUTANEOUS | Status: DC

## 2011-12-29 NOTE — Progress Notes (Signed)
Hematology and Oncology Follow Up Visit  [name] [id number] Nov 02, 1929 78 y.o. 12/29/2011 12:49 PM  Dr Jerilynn Mages Hassell Done DIAGNOSIS:   No diagnosis found. Hx of T2NO breast cancer s/p lumpectomy 06/28/11 for 2.8 cm  N) er/pr+ tumor, with delayed wound healing and persistent hematoma/seroma formation, due to start xrt imminenently. Currently on Arimidex  PAST THERAPY:  As above  Interim History:  As above, delayed wound healing  . The patient has essentially had an open area on the lateral portion of the right breast for approximately 6 months. This has delayed in the radiation. Last time we saw was started on Arimidex which she tolerates pretty well she has no specific complaints. She continues to be concerned about his O. Anastasio Auerbach. She recently had Steri-Strips are placed on top of the wound to approximate the edges so that he could heal.  Medications: I have reviewed the patient's current medications.  Allergies:  Allergies  Allergen Reactions  . Ace Inhibitors Other (See Comments)    Tolerates lisinopril at home. Pt does not recall allergy.  . Atorvastatin     REACTION: myalgias  . Oxycodone-Acetaminophen     REACTION: confusion, fatigue  . Rosuvastatin     REACTION: leg weakness  . Simvastatin     REACTION: leg weakness  . Sulfonamide Derivatives   . Codeine Nausea And Vomiting and Rash    Past Medical History, Surgical history, Social history, and Family History were reviewed and updated.  Review of Systems: Constitutional:  Negative for fever, chills, night sweats, anorexia, weight loss, pain. Cardiovascular: no chest pain or dyspnea on exertion negative Respiratory: no cough, shortness of breath, or wheezing Neurological: no TIA or stroke symptoms Dermatological: negative ENT: negative Skin Gastrointestinal: no abdominal pain, change in bowel habits, or black or bloody stools Genito-Urinary: no dysuria, trouble voiding, or hematuria Hematological and Lymphatic:  negative Breast: negative for breast lumps Musculoskeletal: negative Remaining ROS negative.  Physical Exam: Elderly woman looking stated age and living with some assistance. Blood pressure 161/68, pulse 80, temperature 98 F (36.7 C), height 5\' 6"  (1.676 m), weight 247 lb 1.6 oz (112.084 kg).  ECOG: 0  HEENT:  Sclerae anicteric, conjunctivae pink.  Oropharynx clear.  No mucositis or candidiasis.  Nodes:  No cervical, supraclavicular, or axillary lymphadenopathy palpated.  Breast Exam:  Right breast is normal apart from the open 3 cm area on the lateral portion of the right breast. This area is about a half a centimeter D. There is appears to be beefy red granulation tissue without obvious drainage. Underneath right breast there is evidence of appears to be cutaneous of fungal infection consistent with candidiasis. The left breast is normal...  Lungs:  Clear to auscultation bilaterally.  No crackles, rhonchi, or wheezes.  Heart:  Regular rate and rhythm.  Abdomen:  Soft, nontender.  Positive bowel sounds.  No organomegaly or masses palpated.  Musculoskeletal:  No focal spinal tenderness to palpation.  Extremities:  Benign.  No peripheral edema or cyanosis.  Skin:  Benign.  Neuro:  Nonfocal.     Lab Results: Lab Results  Component Value Date   WBC 7.3 12/21/2011   HGB 11.7* 12/21/2011   HCT 37.9 12/21/2011   MCV 90.5 12/21/2011   PLT 198 12/21/2011     Chemistry      Component Value Date/Time   NA 141 12/19/2011 1345   K 4.6 12/19/2011 1345   CL 103 12/19/2011 1345   CO2 29 12/19/2011 1345   BUN 40*  12/19/2011 1345   CREATININE 1.13* 12/19/2011 1345      Component Value Date/Time   CALCIUM 9.6 12/19/2011 1345   ALKPHOS 102 12/19/2011 1345   AST 28 12/19/2011 1345   ALT 20 12/19/2011 1345   BILITOT 0.5 12/19/2011 1345       Radiological Studies:  No results found.   IMPRESSIONS AND PLAN: A 78 y.o. female with   History of T2 N0 breast cancer with delayed wound healing. I took the opportunity  to ask Dr. Hassell Done to come to see the patient. He inspected the wound and put an additional number Steri-Strips to close it. He will followup with her. I will plan to see the patient in about 3 months time. She is due to see Dr. Valere Dross next week to consider radiation therapy. I've also asked him to convert the patient given that she continues to have an open area.Marland Kitchen at this point I'm not sure about the value of radiation given the time that has elapsed. I have recommend nystatin powder for the area underneath the breast.  Spent more than half the time coordinating care.    Rainier Feuerborn 7/18/201312:49 PM

## 2011-12-29 NOTE — Telephone Encounter (Signed)
gve the pt her jan 2014 appt calendar °

## 2011-12-30 ENCOUNTER — Ambulatory Visit: Payer: Medicare Other

## 2012-01-02 ENCOUNTER — Ambulatory Visit: Payer: Medicare Other | Admitting: Radiation Oncology

## 2012-01-03 ENCOUNTER — Ambulatory Visit: Payer: Medicare Other

## 2012-01-03 ENCOUNTER — Ambulatory Visit (INDEPENDENT_AMBULATORY_CARE_PROVIDER_SITE_OTHER): Payer: Medicare Other | Admitting: Surgery

## 2012-01-03 DIAGNOSIS — C50919 Malignant neoplasm of unspecified site of unspecified female breast: Secondary | ICD-10-CM

## 2012-01-03 DIAGNOSIS — C50911 Malignant neoplasm of unspecified site of right female breast: Secondary | ICD-10-CM

## 2012-01-03 NOTE — Patient Instructions (Addendum)
Dress breast wound with Neosporin daily Return in 1 week for suture examination

## 2012-01-03 NOTE — Progress Notes (Signed)
Mrs. Cata returns today one week from when I Steri-Stripped her wound together over in [place] [facility] office. Its come together a little better but still is about half a centimeter deep. A clean the entire area with some Hibiclens swabs and then after anesthetizing with some lidocaine approximated better with 2 4-0 nylon sutures. We then dressed with Silvadene cream L. see her back in a week and assess this closure. I may leave the sutures in a couple weeks if they don't get infected.

## 2012-01-04 ENCOUNTER — Telehealth: Payer: Self-pay | Admitting: *Deleted

## 2012-01-04 ENCOUNTER — Ambulatory Visit: Payer: Medicare Other

## 2012-01-04 NOTE — Telephone Encounter (Signed)
Spoke w/pt; she will arrive at 3:40 pm on 01/09/12 to see Dr Valere Dross prior to her sim-on-accelerator.

## 2012-01-05 ENCOUNTER — Ambulatory Visit: Payer: Medicare Other

## 2012-01-06 ENCOUNTER — Ambulatory Visit: Payer: Medicare Other

## 2012-01-09 ENCOUNTER — Ambulatory Visit: Admission: RE | Admit: 2012-01-09 | Payer: Medicare Other | Source: Ambulatory Visit

## 2012-01-09 ENCOUNTER — Encounter: Payer: Self-pay | Admitting: Radiation Oncology

## 2012-01-09 NOTE — Progress Notes (Signed)
Chart note: The patient stopped by for a wound check this afternoon. She was seen by Dr. Isidore Moos in my absence. She has sutures in place and she will see Dr. Hassell Done again this Wednesday. I will have the patient return to see me in one week, and hope to begin radiation therapy next week.

## 2012-01-10 ENCOUNTER — Ambulatory Visit: Payer: Medicare Other

## 2012-01-11 ENCOUNTER — Ambulatory Visit (INDEPENDENT_AMBULATORY_CARE_PROVIDER_SITE_OTHER): Payer: Medicare Other | Admitting: Surgery

## 2012-01-11 ENCOUNTER — Ambulatory Visit: Payer: Medicare Other

## 2012-01-11 ENCOUNTER — Encounter (INDEPENDENT_AMBULATORY_CARE_PROVIDER_SITE_OTHER): Payer: Self-pay | Admitting: Surgery

## 2012-01-11 VITALS — BP 140/80 | HR 74 | Temp 97.2°F | Resp 16 | Ht 66.0 in | Wt 245.0 lb

## 2012-01-11 DIAGNOSIS — C50919 Malignant neoplasm of unspecified site of unspecified female breast: Secondary | ICD-10-CM

## 2012-01-11 DIAGNOSIS — C50911 Malignant neoplasm of unspecified site of right female breast: Secondary | ICD-10-CM

## 2012-01-11 NOTE — Progress Notes (Signed)
[name] 78 y.o.  Body mass index is 39.54 kg/(m^2).  Patient Active Problem List  Diagnosis  . POLYP, COLON  . DIABETES MELLITUS, TYPE II  . HYPERLIPIDEMIA  . GOUT  . ANXIETY  . PERIPHERAL NEUROPATHY  . HYPERTENSION  . CVA  . DIVERTICULOSIS, COLON  . IRRITABLE BOWEL, PREDOMINANTLY CONSTIPATION  . FATIGUE  . COLONIC POLYPS, HX OF  . TREMOR  . Bile duct calculus with nonacute cholecystitis  . Morbid obesity  . Preventative health care  . Shingles  . Breast cancer, right breast-lobular s/p lumpectomy and sentinel node biopsy  . Cancer of upper-outer quadrant of female breast  . Diverticulosis of colon with hemorrhage  . Acute posthemorrhagic anemia    Allergies  Allergen Reactions  . Ace Inhibitors Other (See Comments)    Tolerates lisinopril at home. Pt does not recall allergy.  . Atorvastatin     REACTION: myalgias  . Oxycodone-Acetaminophen     REACTION: confusion, fatigue  . Rosuvastatin     REACTION: leg weakness  . Simvastatin     REACTION: leg weakness  . Sulfonamide Derivatives   . Codeine Nausea And Vomiting and Rash    Past Surgical History  Procedure Date  . Abdominal hysterectomy   . Bile duct stent placement ~ 01/2011  . Pelvic bone tumor removal     twice in 2000's  . Colonic perforation repair ~ 2000  . Total hip arthroplasty 1980's    right  . Joint replacement   . Colon surgery     hole in intestine ,tumors?  . Tonsillectomy ~ 1946  . Cholecystectomy 03/23/11    lap chole   . Replacement total knee bilateral ~ 2008; ~ 2010    right; left  . Cataract extraction w/ intraocular lens  implant, bilateral 1990's  . Hemorrhoid surgery 2012  . Breast lumpectomy 06/29/11    right  . Breast lumpectomy 06/29/2011    Procedure: BREAST LUMPECTOMY WITH EXCISION OF SENTINEL NODE;  Surgeon: Pedro Earls, MD;  Location: [facility];  Service: General;  Laterality: Right;  right sentinel node mapping,right sentinel node biopsy, needle localization right  breast lumpectomy  . Incision and drainage breast abscess     right breast seroma   [name], MD No diagnosis found.  [name] comes in this afternoon. I had placed sutures last week which seemed to be coapting her breast very well. I believe those in place pinning her visit with Dr. Valere Dross next week and hopefully they can get her radiation therapy underway. We can take out her stitches after he sees her and before her radiation actually begins. He will be okay for him to remove if he wants to himself. In the meantime she really wanted to go swimming in her swimming pool and up through that just as long as she keeps the area dry with a hair dryer.   [name], MD, [facility] [phone] beeper [phone]  01/11/2012 5:36 PM

## 2012-01-11 NOTE — Patient Instructions (Addendum)
OK to get into swimming pool

## 2012-01-12 ENCOUNTER — Ambulatory Visit: Payer: Medicare Other

## 2012-01-13 ENCOUNTER — Ambulatory Visit: Payer: Medicare Other

## 2012-01-16 ENCOUNTER — Encounter: Payer: Self-pay | Admitting: Radiation Oncology

## 2012-01-16 ENCOUNTER — Ambulatory Visit: Admission: RE | Admit: 2012-01-16 | Payer: Medicare Other | Source: Ambulatory Visit

## 2012-01-16 NOTE — Progress Notes (Signed)
Chart note: [name] is seen today prior to her scheduled port films. She tells me she had placement of sutures by Dr. Hassell Done last week, and things are going well. She has not had drainage since late last week. On examination today there is no evidence for infection. The wound edges are well approximated. Impression: Satisfactory progress. I think we should keep sutures in for a minimum of 2 weeks, and I more than happy remove them when she returns for port films and initiation of radiation therapy the week of August 19.

## 2012-01-17 ENCOUNTER — Ambulatory Visit: Payer: Medicare Other

## 2012-01-18 ENCOUNTER — Ambulatory Visit: Payer: Medicare Other

## 2012-01-19 ENCOUNTER — Ambulatory Visit: Payer: Medicare Other

## 2012-01-20 ENCOUNTER — Ambulatory Visit: Payer: Medicare Other

## 2012-01-23 ENCOUNTER — Ambulatory Visit: Payer: Medicare Other

## 2012-01-24 ENCOUNTER — Ambulatory Visit: Payer: Medicare Other

## 2012-01-25 ENCOUNTER — Ambulatory Visit: Payer: Medicare Other

## 2012-01-26 ENCOUNTER — Ambulatory Visit (INDEPENDENT_AMBULATORY_CARE_PROVIDER_SITE_OTHER): Payer: Medicare Other | Admitting: Surgery

## 2012-01-26 ENCOUNTER — Ambulatory Visit: Payer: Medicare Other

## 2012-01-26 ENCOUNTER — Encounter (INDEPENDENT_AMBULATORY_CARE_PROVIDER_SITE_OTHER): Payer: Self-pay | Admitting: Surgery

## 2012-01-26 VITALS — BP 128/82 | HR 70 | Temp 97.4°F | Resp 16 | Ht 66.0 in | Wt 243.2 lb

## 2012-01-26 DIAGNOSIS — T07XXXA Unspecified multiple injuries, initial encounter: Secondary | ICD-10-CM

## 2012-01-26 DIAGNOSIS — T148XXD Other injury of unspecified body region, subsequent encounter: Secondary | ICD-10-CM

## 2012-01-26 NOTE — Progress Notes (Signed)
Sutures removed from incision.  This is about 1 mm deep and without infection. AgNO3 applied.   Slow healing but looks ok.  Will see her back in 6 weeks.  Dr. Valere Dross may proceed with RT when he deems it appropriate.  I am in agreement with his timetable.  She is to see him next week.  Return 6 weeks

## 2012-01-26 NOTE — Patient Instructions (Signed)
Apply neosporin daily to healing wound

## 2012-01-27 ENCOUNTER — Ambulatory Visit: Payer: Medicare Other

## 2012-01-30 ENCOUNTER — Encounter: Payer: Self-pay | Admitting: Radiation Oncology

## 2012-01-30 ENCOUNTER — Ambulatory Visit: Admission: RE | Admit: 2012-01-30 | Payer: Medicare Other | Source: Ambulatory Visit

## 2012-01-30 NOTE — Progress Notes (Signed)
Clinic note: [name] visits today for a right chest wall mastectomy scar wound check. Dr. Hassell Done removed her sutures. She apparently had some "backup drainage" according to the patient. Dr. Hassell Done applied silver nitrate and will see her back in 6 weeks.  On examination today there is a 1.5-2.0 cm long wound which is approximately 3 mm deep. It appears to be clean without infection.  Impression: She is not completely healed and I think it will be at least 2 weeks before we can begin her radiation therapy.  Plan: I will schedule her for port films and a visit with me in 2 weeks.

## 2012-01-31 ENCOUNTER — Ambulatory Visit: Payer: Medicare Other

## 2012-02-01 ENCOUNTER — Ambulatory Visit: Payer: Medicare Other

## 2012-02-02 ENCOUNTER — Ambulatory Visit: Payer: Medicare Other

## 2012-02-03 ENCOUNTER — Ambulatory Visit: Payer: Medicare Other

## 2012-02-06 ENCOUNTER — Ambulatory Visit: Payer: Medicare Other

## 2012-02-07 ENCOUNTER — Ambulatory Visit: Payer: Medicare Other

## 2012-02-08 ENCOUNTER — Ambulatory Visit: Payer: Medicare Other

## 2012-02-09 ENCOUNTER — Ambulatory Visit: Payer: Medicare Other

## 2012-02-10 ENCOUNTER — Ambulatory Visit: Payer: Medicare Other

## 2012-02-14 ENCOUNTER — Ambulatory Visit
Admission: RE | Admit: 2012-02-14 | Discharge: 2012-02-14 | Disposition: A | Discharge status: Home / Self Care | Payer: Medicare Other | Source: Ambulatory Visit | Attending: Radiation Oncology | Admitting: Radiation Oncology

## 2012-02-14 DIAGNOSIS — C50419 Malignant neoplasm of upper-outer quadrant of unspecified female breast: Secondary | ICD-10-CM | POA: Insufficient documentation

## 2012-02-14 DIAGNOSIS — C50911 Malignant neoplasm of unspecified site of right female breast: Secondary | ICD-10-CM

## 2012-02-14 NOTE — Progress Notes (Signed)
His simulation verification note:  The patient was taken to the [facility] and underwent simulation verification for treatment to her right breast. Her isocenter is in good position and the multileaf collimators contoured the treatment volume appropriately. On inspection of her right partial mastectomy wound there is a  3 mm superficial area of granulating tissue , I feel that we can proceed with radiation therapy as prescribed. She'll begin her treatment tomorrow.

## 2012-02-15 ENCOUNTER — Ambulatory Visit
Admission: RE | Admit: 2012-02-15 | Discharge: 2012-02-15 | Disposition: A | Discharge status: Home / Self Care | Payer: Medicare Other | Source: Ambulatory Visit | Attending: Radiation Oncology | Admitting: Radiation Oncology

## 2012-02-15 ENCOUNTER — Encounter (INDEPENDENT_AMBULATORY_CARE_PROVIDER_SITE_OTHER): Payer: Medicare Other | Admitting: Surgery

## 2012-02-15 DIAGNOSIS — C50419 Malignant neoplasm of upper-outer quadrant of unspecified female breast: Secondary | ICD-10-CM

## 2012-02-15 MED ORDER — RADIAPLEXRX EX GEL
Freq: Once | CUTANEOUS | Status: AC
  Administered 2012-02-15: 14:00:00 via TOPICAL

## 2012-02-15 MED ORDER — ALRA NON-METALLIC DEODORANT (RAD-ONC)
1.0000 "application " | Freq: Once | TOPICAL | Status: AC
  Administered 2012-02-15: 1 via TOPICAL

## 2012-02-15 NOTE — Progress Notes (Signed)
Post sim teaching , done, teach back by pateint

## 2012-02-16 ENCOUNTER — Ambulatory Visit
Admission: RE | Admit: 2012-02-16 | Discharge: 2012-02-16 | Disposition: A | Discharge status: Home / Self Care | Payer: Medicare Other | Source: Ambulatory Visit | Attending: Radiation Oncology | Admitting: Radiation Oncology

## 2012-02-16 DIAGNOSIS — C50419 Malignant neoplasm of upper-outer quadrant of unspecified female breast: Secondary | ICD-10-CM

## 2012-02-16 MED ORDER — RADIAPLEXRX EX GEL
Freq: Once | CUTANEOUS | Status: AC
  Administered 2012-02-16: 14:00:00 via TOPICAL

## 2012-02-16 MED ORDER — RADIAPLEXRX EX GEL: Freq: Once | CUTANEOUS | Status: DC

## 2012-02-16 NOTE — Progress Notes (Signed)
Patient request 2nd tube radiaplex gel, husband lost her 1st tube  When folding up her walker w/ seat, when they were visiting her sister in [place] to patient 2:21 PM

## 2012-02-17 ENCOUNTER — Ambulatory Visit
Admission: RE | Admit: 2012-02-17 | Discharge: 2012-02-17 | Disposition: A | Discharge status: Home / Self Care | Payer: Medicare Other | Source: Ambulatory Visit | Attending: Radiation Oncology | Admitting: Radiation Oncology

## 2012-02-20 ENCOUNTER — Ambulatory Visit
Admission: RE | Admit: 2012-02-20 | Discharge: 2012-02-20 | Disposition: A | Discharge status: Home / Self Care | Payer: Medicare Other | Source: Ambulatory Visit | Attending: Radiation Oncology | Admitting: Radiation Oncology

## 2012-02-20 ENCOUNTER — Ambulatory Visit: Payer: Medicare Other | Admitting: Internal Medicine

## 2012-02-20 ENCOUNTER — Encounter: Payer: Self-pay | Admitting: Radiation Oncology

## 2012-02-20 VITALS — BP 179/82 | HR 89 | Temp 97.6°F | Resp 20 | Wt 244.3 lb

## 2012-02-20 DIAGNOSIS — C50911 Malignant neoplasm of unspecified site of right female breast: Secondary | ICD-10-CM

## 2012-02-20 NOTE — Progress Notes (Signed)
Weekly rad tx right breast 4/33 completed, patient alert,oriented x3, no skin changes right breast noted, patient lost her sister [name] yesterday age 79, , using radiaplex gel, offerred chaplain support, patient declined"I have my Church people and My God", no c/o pain, just a little soreness at times 2:46 PM

## 2012-02-20 NOTE — Progress Notes (Signed)
Weekly Management Note:  Site:R Breast Current Dose:  720  cGy Projected Dose: 6080  cGy  Narrative: The patient is seen today for routine under treatment assessment. CBCT/MVCT images/port films were reviewed. The chart was reviewed.   Her sister died over the weekend after a fall. She is without complaints today. She has Radioplex gel to use when necessary.  Physical Examination:  Filed Vitals:   02/20/12 1444  BP: 179/82  Pulse: 89  Temp: 97.6 F (36.4 C)  Resp: 20  .  Weight: 244 lb 4.8 oz (110.814 kg). Her partial mastectomy wound is well-healed. No significant skin changes.  Impression: Tolerating radiation therapy well.  Plan: Continue radiation therapy as planned.

## 2012-02-21 ENCOUNTER — Ambulatory Visit
Admission: RE | Admit: 2012-02-21 | Discharge: 2012-02-21 | Disposition: A | Discharge status: Home / Self Care | Payer: Medicare Other | Source: Ambulatory Visit | Attending: Radiation Oncology | Admitting: Radiation Oncology

## 2012-02-21 DIAGNOSIS — C50419 Malignant neoplasm of upper-outer quadrant of unspecified female breast: Secondary | ICD-10-CM

## 2012-02-22 ENCOUNTER — Ambulatory Visit
Admission: RE | Admit: 2012-02-22 | Discharge: 2012-02-22 | Disposition: A | Discharge status: Home / Self Care | Payer: Medicare Other | Source: Ambulatory Visit | Attending: Radiation Oncology | Admitting: Radiation Oncology

## 2012-02-23 ENCOUNTER — Ambulatory Visit
Admission: RE | Admit: 2012-02-23 | Discharge: 2012-02-23 | Disposition: A | Discharge status: Home / Self Care | Payer: Medicare Other | Source: Ambulatory Visit | Attending: Radiation Oncology | Admitting: Radiation Oncology

## 2012-02-24 ENCOUNTER — Ambulatory Visit
Admission: RE | Admit: 2012-02-24 | Discharge: 2012-02-24 | Disposition: A | Discharge status: Home / Self Care | Payer: Medicare Other | Source: Ambulatory Visit | Attending: Radiation Oncology | Admitting: Radiation Oncology

## 2012-02-27 ENCOUNTER — Ambulatory Visit
Admission: RE | Admit: 2012-02-27 | Discharge: 2012-02-27 | Disposition: A | Discharge status: Home / Self Care | Payer: Medicare Other | Source: Ambulatory Visit | Attending: Radiation Oncology | Admitting: Radiation Oncology

## 2012-02-27 VITALS — BP 152/57 | HR 79 | Temp 97.7°F | Resp 20 | Wt 250.1 lb

## 2012-02-27 DIAGNOSIS — C50419 Malignant neoplasm of upper-outer quadrant of unspecified female breast: Secondary | ICD-10-CM

## 2012-02-27 NOTE — Progress Notes (Signed)
Here for weekly rad txs right breast:9/33 completed, slight erythema on incision site right breast, no c/o pain, occasional twinge there at incision ,using radiaples gel  Buried her sister this past Saturday 4:16 PM

## 2012-02-27 NOTE — Progress Notes (Signed)
Weekly Management Note:  Site:R Breast Current Dose:  1620  cGy Projected Dose: 6080  cGy  Narrative: The patient is seen today for routine under treatment assessment. CBCT/MVCT images/port films were reviewed. The chart was reviewed.   No complaints today. She uses Radioplex gel.  Physical Examination:  Filed Vitals:   02/27/12 1614  BP: 152/57  Pulse: 79  Temp: 97.7 F (36.5 C)  Resp: 20  .  Weight: 250 lb 1.6 oz (113.445 kg). There is faint erythema the skin along her right breast. No areas of desquamation.  Impression: Tolerating radiation therapy well.  Plan: Continue radiation therapy as planned.

## 2012-02-28 ENCOUNTER — Ambulatory Visit: Payer: Medicare Other

## 2012-02-29 ENCOUNTER — Ambulatory Visit
Admission: RE | Admit: 2012-02-29 | Discharge: 2012-02-29 | Disposition: A | Discharge status: Home / Self Care | Payer: Medicare Other | Source: Ambulatory Visit | Attending: Radiation Oncology | Admitting: Radiation Oncology

## 2012-03-01 ENCOUNTER — Ambulatory Visit
Admission: RE | Admit: 2012-03-01 | Discharge: 2012-03-01 | Disposition: A | Discharge status: Home / Self Care | Payer: Medicare Other | Source: Ambulatory Visit | Attending: Radiation Oncology | Admitting: Radiation Oncology

## 2012-03-02 ENCOUNTER — Ambulatory Visit
Admission: RE | Admit: 2012-03-02 | Discharge: 2012-03-02 | Disposition: A | Discharge status: Home / Self Care | Payer: Medicare Other | Source: Ambulatory Visit | Attending: Radiation Oncology | Admitting: Radiation Oncology

## 2012-03-05 ENCOUNTER — Ambulatory Visit
Admission: RE | Admit: 2012-03-05 | Discharge: 2012-03-05 | Disposition: A | Discharge status: Home / Self Care | Payer: Medicare Other | Source: Ambulatory Visit | Attending: Radiation Oncology | Admitting: Radiation Oncology

## 2012-03-05 ENCOUNTER — Encounter: Payer: Self-pay | Admitting: Radiation Oncology

## 2012-03-05 VITALS — BP 173/66 | HR 80 | Temp 97.6°F | Resp 20 | Wt 250.2 lb

## 2012-03-05 DIAGNOSIS — C50911 Malignant neoplasm of unspecified site of right female breast: Secondary | ICD-10-CM

## 2012-03-05 NOTE — Progress Notes (Signed)
Weekly Management Note:  Site:R Breast Current Dose:  2340  cGy Projected Dose: 6080  cGy  Narrative: The patient is seen today for routine under treatment assessment. CBCT/MVCT images/port films were reviewed. The chart was reviewed.   She is without complaints today. She does admit to mild fatigue. She uses Radioplex gel.  Physical Examination:  Filed Vitals:   03/05/12 1607  BP: 173/66  Pulse: 80  Temp: 97.6 F (36.4 C)  Resp: 20  .  Weight: 250 lb 3.2 oz (113.49 kg). There is mild erythema the skin within the treatment field along the right breast. No areas of desquamation.  Impression: Tolerating radiation therapy well.  Plan: Continue radiation therapy as planned.

## 2012-03-05 NOTE — Progress Notes (Signed)
Patient  Here weekly rad txs, 13/33 ,slight erythema, no c/o pain, using radiaplex gel, some fatigue Doing well 4:10 PM

## 2012-03-06 ENCOUNTER — Ambulatory Visit
Admission: RE | Admit: 2012-03-06 | Discharge: 2012-03-06 | Disposition: A | Discharge status: Home / Self Care | Payer: Medicare Other | Source: Ambulatory Visit | Attending: Radiation Oncology | Admitting: Radiation Oncology

## 2012-03-07 ENCOUNTER — Ambulatory Visit
Admission: RE | Admit: 2012-03-07 | Discharge: 2012-03-07 | Disposition: A | Discharge status: Home / Self Care | Payer: Medicare Other | Source: Ambulatory Visit | Attending: Radiation Oncology | Admitting: Radiation Oncology

## 2012-03-08 ENCOUNTER — Ambulatory Visit
Admission: RE | Admit: 2012-03-08 | Discharge: 2012-03-08 | Disposition: A | Discharge status: Home / Self Care | Payer: Medicare Other | Source: Ambulatory Visit | Attending: Radiation Oncology | Admitting: Radiation Oncology

## 2012-03-09 ENCOUNTER — Ambulatory Visit
Admission: RE | Admit: 2012-03-09 | Discharge: 2012-03-09 | Disposition: A | Discharge status: Home / Self Care | Payer: Medicare Other | Source: Ambulatory Visit | Attending: Radiation Oncology | Admitting: Radiation Oncology

## 2012-03-12 ENCOUNTER — Ambulatory Visit
Admission: RE | Admit: 2012-03-12 | Discharge: 2012-03-12 | Disposition: A | Discharge status: Home / Self Care | Payer: Medicare Other | Source: Ambulatory Visit | Attending: Radiation Oncology | Admitting: Radiation Oncology

## 2012-03-12 ENCOUNTER — Encounter: Payer: Self-pay | Admitting: Radiation Oncology

## 2012-03-12 VITALS — BP 147/81 | HR 73 | Temp 97.9°F | Resp 20 | Wt 251.0 lb

## 2012-03-12 DIAGNOSIS — C50911 Malignant neoplasm of unspecified site of right female breast: Secondary | ICD-10-CM

## 2012-03-12 NOTE — Progress Notes (Signed)
Weekly Management Note:  Site:R Breast Current Dose:  3240  cGy Projected Dose: 6080  cGy  Narrative: The patient is seen today for routine under treatment assessment. CBCT/MVCT images/port films were reviewed. The chart was reviewed.   No complaints today. She uses Radioplex gel when necessary.  Physical Examination:  Filed Vitals:   03/12/12 1554  BP: 147/81  Pulse: 73  Temp: 97.9 F (36.6 C)  Resp: 20  .  Weight: 251 lb (113.853 kg). There is mild erythema the skin along her right breast. No areas of desquamation.  Impression: Tolerating radiation therapy well.  Plan: Continue radiation therapy as planned.

## 2012-03-12 NOTE — Progress Notes (Signed)
Patient here weekly rad txs r breast area :18/33   Erythema only,skin intact, no c/o pain using radiaplex gel bid 3:56 PM

## 2012-03-13 ENCOUNTER — Ambulatory Visit
Admission: RE | Admit: 2012-03-13 | Discharge: 2012-03-13 | Disposition: A | Discharge status: Home / Self Care | Payer: Medicare Other | Source: Ambulatory Visit | Attending: Radiation Oncology | Admitting: Radiation Oncology

## 2012-03-14 ENCOUNTER — Ambulatory Visit
Admission: RE | Admit: 2012-03-14 | Discharge: 2012-03-14 | Disposition: A | Discharge status: Home / Self Care | Payer: Medicare Other | Source: Ambulatory Visit | Attending: Radiation Oncology | Admitting: Radiation Oncology

## 2012-03-14 ENCOUNTER — Encounter (INDEPENDENT_AMBULATORY_CARE_PROVIDER_SITE_OTHER): Payer: Medicare Other | Admitting: Surgery

## 2012-03-15 ENCOUNTER — Ambulatory Visit
Admission: RE | Admit: 2012-03-15 | Discharge: 2012-03-15 | Disposition: A | Discharge status: Home / Self Care | Payer: Medicare Other | Source: Ambulatory Visit | Attending: Radiation Oncology | Admitting: Radiation Oncology

## 2012-03-16 ENCOUNTER — Ambulatory Visit
Admission: RE | Admit: 2012-03-16 | Discharge: 2012-03-16 | Disposition: A | Discharge status: Home / Self Care | Payer: Medicare Other | Source: Ambulatory Visit | Attending: Radiation Oncology | Admitting: Radiation Oncology

## 2012-03-19 ENCOUNTER — Ambulatory Visit
Admission: RE | Admit: 2012-03-19 | Discharge: 2012-03-19 | Disposition: A | Discharge status: Home / Self Care | Payer: Medicare Other | Source: Ambulatory Visit | Attending: Radiation Oncology | Admitting: Radiation Oncology

## 2012-03-19 ENCOUNTER — Encounter: Payer: Self-pay | Admitting: Radiation Oncology

## 2012-03-19 VITALS — BP 164/59 | HR 82 | Temp 98.2°F | Resp 20 | Wt 252.6 lb

## 2012-03-19 DIAGNOSIS — C50911 Malignant neoplasm of unspecified site of right female breast: Secondary | ICD-10-CM

## 2012-03-19 NOTE — Progress Notes (Signed)
Weekly Management Note:  Site:R Breast Current Dose:  4140  cGy Projected Dose: 6080  cGy  Narrative: The patient is seen today for routine under treatment assessment. CBCT/MVCT images/port films were reviewed. The chart was reviewed.   She is without complaints today although she does report inframammary discomfort along the right breast. She uses Radioplex gel.  Physical Examination:  Filed Vitals:   03/19/12 1615  BP: 164/59  Pulse: 82  Temp: 98.2 F (36.8 C)  Resp: 20  .  Weight: 252 lb 9.6 oz (114.579 kg). There is confluent erythema along the right breast with impending dry desquamation along the inframammary region. No areas of moist desquamation.  Impression: Tolerating radiation therapy well.  Plan: Continue radiation therapy as planned.

## 2012-03-19 NOTE — Progress Notes (Signed)
Patient here weekly rad tx right breast, 23/33  Erythema and dermatitis to breast,under fold imframmy breast skin abrasion in a couploe places, patient c/o burning there 4:18 PM

## 2012-03-20 ENCOUNTER — Ambulatory Visit
Admission: RE | Admit: 2012-03-20 | Discharge: 2012-03-20 | Disposition: A | Discharge status: Home / Self Care | Payer: Medicare Other | Source: Ambulatory Visit | Attending: Radiation Oncology | Admitting: Radiation Oncology

## 2012-03-20 ENCOUNTER — Encounter: Payer: Self-pay | Admitting: Radiation Oncology

## 2012-03-20 NOTE — Progress Notes (Signed)
Complex simulation note: [name] underwent complex simulation for her right breast boost with virtual software. She was set up to 3 field technique. 3 separate multileaf collimators were designed to conform the field. I prescribing 1400 cGy in 7 sessions utilizing mixed energy photons. An isodose plan and dosimetry are requested.

## 2012-03-21 ENCOUNTER — Encounter: Payer: Self-pay | Admitting: Radiation Oncology

## 2012-03-21 ENCOUNTER — Ambulatory Visit
Admission: RE | Admit: 2012-03-21 | Discharge: 2012-03-21 | Disposition: A | Discharge status: Home / Self Care | Payer: Medicare Other | Source: Ambulatory Visit | Attending: Radiation Oncology | Admitting: Radiation Oncology

## 2012-03-21 NOTE — Progress Notes (Signed)
Simulation verification note: The patient underwent simulation verification for her right breast boost. Her isocenter is in good position and the multileaf collimators contoured the treatment volume appropriately.

## 2012-03-22 ENCOUNTER — Ambulatory Visit
Admission: RE | Admit: 2012-03-22 | Discharge: 2012-03-22 | Disposition: A | Discharge status: Home / Self Care | Payer: Medicare Other | Source: Ambulatory Visit | Attending: Radiation Oncology | Admitting: Radiation Oncology

## 2012-03-23 ENCOUNTER — Ambulatory Visit
Admission: RE | Admit: 2012-03-23 | Discharge: 2012-03-23 | Disposition: A | Discharge status: Home / Self Care | Payer: Medicare Other | Source: Ambulatory Visit | Attending: Radiation Oncology | Admitting: Radiation Oncology

## 2012-03-26 ENCOUNTER — Ambulatory Visit
Admission: RE | Admit: 2012-03-26 | Discharge: 2012-03-26 | Disposition: A | Discharge status: Home / Self Care | Payer: Medicare Other | Source: Ambulatory Visit | Attending: Radiation Oncology | Admitting: Radiation Oncology

## 2012-03-26 ENCOUNTER — Encounter: Payer: Self-pay | Admitting: Radiation Oncology

## 2012-03-26 VITALS — BP 163/65 | HR 84 | Temp 97.7°F | Resp 20 | Wt 250.1 lb

## 2012-03-26 DIAGNOSIS — C50911 Malignant neoplasm of unspecified site of right female breast: Secondary | ICD-10-CM

## 2012-03-26 NOTE — Progress Notes (Signed)
Weekly Management Note:  Site:R Breast Current Dose:  5080  cGy Projected Dose: 6080  cGy  Narrative: The patient is seen today for routine under treatment assessment. CBCT/MVCT images/port films were reviewed. The chart was reviewed.   She has been active canning tomatoes and has been more symptomatic along the right inframammary region where she has developed a moist desquamation. She has been using Neosporin ointment.  Physical Examination:  Filed Vitals:   03/26/12 1406  BP: 163/65  Pulse: 84  Temp: 97.7 F (36.5 C)  Resp: 20  .  Weight: 250 lb 1.6 oz (113.445 kg). There is erythema/hyperpigmentation the skin along the entire right breast with patchy moist desquamation along the right inframammary region/breast.  Impression: Tolerating radiation therapy well, although she does have moist desquamation. We discussed her wound care including the use of Neosporin ointment for the next one to 2 weeks.  Plan: Continue radiation therapy as planned. She will finish in one week.

## 2012-03-26 NOTE — Progress Notes (Signed)
Patient here weekly rad txs:on boost 2/7 completed right breast, erythema, on right breast, skin broken under fold of breast, using neosporin there, fatigue some also,  Eating well, says her uric acid is high, knows what diet she should be on2:10 PM

## 2012-03-27 ENCOUNTER — Ambulatory Visit
Admission: RE | Admit: 2012-03-27 | Discharge: 2012-03-27 | Disposition: A | Discharge status: Home / Self Care | Payer: Medicare Other | Source: Ambulatory Visit | Attending: Radiation Oncology | Admitting: Radiation Oncology

## 2012-03-28 ENCOUNTER — Ambulatory Visit
Admission: RE | Admit: 2012-03-28 | Discharge: 2012-03-28 | Disposition: A | Discharge status: Home / Self Care | Payer: Medicare Other | Source: Ambulatory Visit | Attending: Radiation Oncology | Admitting: Radiation Oncology

## 2012-03-29 ENCOUNTER — Ambulatory Visit
Admission: RE | Admit: 2012-03-29 | Discharge: 2012-03-29 | Disposition: A | Discharge status: Home / Self Care | Payer: Medicare Other | Source: Ambulatory Visit | Attending: Radiation Oncology | Admitting: Radiation Oncology

## 2012-03-30 ENCOUNTER — Ambulatory Visit
Admission: RE | Admit: 2012-03-30 | Discharge: 2012-03-30 | Disposition: A | Discharge status: Home / Self Care | Payer: Medicare Other | Source: Ambulatory Visit | Attending: Radiation Oncology | Admitting: Radiation Oncology

## 2012-04-02 ENCOUNTER — Encounter: Payer: Self-pay | Admitting: Radiation Oncology

## 2012-04-02 ENCOUNTER — Ambulatory Visit
Admission: RE | Admit: 2012-04-02 | Discharge: 2012-04-02 | Disposition: A | Discharge status: Home / Self Care | Payer: Medicare Other | Source: Ambulatory Visit | Attending: Radiation Oncology | Admitting: Radiation Oncology

## 2012-04-02 VITALS — BP 127/85 | HR 77 | Resp 18 | Wt 250.1 lb

## 2012-04-02 DIAGNOSIS — C50911 Malignant neoplasm of unspecified site of right female breast: Secondary | ICD-10-CM

## 2012-04-02 NOTE — Progress Notes (Signed)
Patient presents to the clinic today unaccompanied following final radiation treatment to the right breast. Patient alert and oriented to person, place, and time. No distress noted. Slow steady gait noted with assistance of rolling walker. Pleasant affect noted. Patient denies pain at this time. Hyperpigmentation with desquamation under right breast noted. Encouraged patient to continue with radiaplex gel and non adherent dressings under her right breast. Patient verbalized understanding. Patient reports that her energy level has been as good as it was before since they began the boost. No swelling noted in right arm. Provided patient with [facility] and [facility] flyers then, reviewed material. Also, provided patient with appointment card for one month follow up. Reported all findings to Dr. Valere Dross.

## 2012-04-02 NOTE — Progress Notes (Signed)
Weekly Management Note:  Site:R Breast Current Dose:  6080  cGy Projected Dose: 6080  cGy  Narrative: The patient is seen today for routine under treatment assessment. CBCT/MVCT images/port films were reviewed. The chart was reviewed.   She is without new complaints today. She's been using Neosporin ointment along her inframammary moist desquamation. She is having less discomfort.  Physical Examination:  Filed Vitals:   04/02/12 1359  BP: 127/85  Pulse: 77  Resp: 18  .  Weight: 250 lb 1.6 oz (113.445 kg). There is moderate erythema the skin along the right breast with patchy dry desquamation and almost complete healing of her inframammary moist desquamation.  Impression: Tolerating radiation therapy well.  Plan: Radiation therapy completed. Followup visit with me in one month.

## 2012-04-02 NOTE — Progress Notes (Signed)
[facility] End of Treatment Note  Name:[name]  Date: 04/02/2012 [id number] DOB:Aug 30, 1929   Status:outpatient    CC: [name], MD  Dr. [name], Dr. Johnathan Hausen  REFERRING PHYSICIAN: Dr. Collier Salina Rubin/Dr. 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   Mixed 10 MV/6 MV photons 3 field setup for the right breast boost.             NARRATIVE:   We had a significant delay in the initiation of radiation therapy secondary to poor partial mastectomy wound healing. Were not able to start her treatment until early September. She tolerated her treatment well although she developed a focal moist desquamation along the right inframammary region which was treated with antibiotic ointment.                         PLAN: Routine followup in one month. Patient instructed to call if questions or worsening complaints in interim.

## 2012-04-17 ENCOUNTER — Encounter: Payer: Self-pay | Admitting: Internal Medicine

## 2012-04-17 ENCOUNTER — Ambulatory Visit (INDEPENDENT_AMBULATORY_CARE_PROVIDER_SITE_OTHER): Payer: Medicare Other | Admitting: Internal Medicine

## 2012-04-17 ENCOUNTER — Other Ambulatory Visit: Payer: Self-pay | Admitting: *Deleted

## 2012-04-17 ENCOUNTER — Other Ambulatory Visit (INDEPENDENT_AMBULATORY_CARE_PROVIDER_SITE_OTHER): Payer: Medicare Other

## 2012-04-17 ENCOUNTER — Telehealth: Payer: Self-pay | Admitting: Internal Medicine

## 2012-04-17 VITALS — BP 150/62 | HR 77 | Temp 98.0°F | Ht 66.0 in | Wt 247.1 lb

## 2012-04-17 DIAGNOSIS — E119 Type 2 diabetes mellitus without complications: Secondary | ICD-10-CM

## 2012-04-17 DIAGNOSIS — Z79899 Other long term (current) drug therapy: Secondary | ICD-10-CM

## 2012-04-17 DIAGNOSIS — M109 Gout, unspecified: Secondary | ICD-10-CM

## 2012-04-17 DIAGNOSIS — Z0000 Encounter for general adult medical examination without abnormal findings: Secondary | ICD-10-CM

## 2012-04-17 DIAGNOSIS — Z23 Encounter for immunization: Secondary | ICD-10-CM

## 2012-04-17 DIAGNOSIS — C50919 Malignant neoplasm of unspecified site of unspecified female breast: Secondary | ICD-10-CM

## 2012-04-17 LAB — BASIC METABOLIC PANEL
BUN: 34 mg/dL — ABNORMAL HIGH (ref 6–23)
CO2: 32 mEq/L (ref 19–32)
Calcium: 9.4 mg/dL (ref 8.4–10.5)
Chloride: 100 mEq/L (ref 96–112)
Creatinine, Ser: 1.3 mg/dL — ABNORMAL HIGH (ref 0.4–1.2)
GFR: 43.01 mL/min — ABNORMAL LOW (ref >60.00)
Glucose, Bld: 131 mg/dL — ABNORMAL HIGH (ref 70–99)
Potassium: 4 mEq/L (ref 3.5–5.1)
Sodium: 140 mEq/L (ref 135–145)

## 2012-04-17 LAB — LIPID PANEL
Cholesterol: 219 mg/dL — ABNORMAL HIGH (ref 0–200)
HDL: 31.3 mg/dL — ABNORMAL LOW (ref >39.00)
Total CHOL/HDL Ratio: 7
Triglycerides: 276 mg/dL — ABNORMAL HIGH (ref 0.0–149.0)
VLDL: 55.2 mg/dL — ABNORMAL HIGH (ref 0.0–40.0)

## 2012-04-17 LAB — CBC WITH DIFFERENTIAL/PLATELET
Basophils Absolute: 0 10*3/uL (ref 0.0–0.1)
Basophils Relative: 0.2 % (ref 0.0–3.0)
Eosinophils Absolute: 0.2 10*3/uL (ref 0.0–0.7)
Eosinophils Relative: 3.5 % (ref 0.0–5.0)
HCT: 43.9 % (ref 36.0–46.0)
Hemoglobin: 14 g/dL (ref 12.0–15.0)
Lymphocytes Relative: 16.8 % (ref 12.0–46.0)
Lymphs Abs: 1 10*3/uL (ref 0.7–4.0)
MCHC: 31.9 g/dL (ref 30.0–36.0)
MCV: 90.6 fl (ref 78.0–100.0)
Monocytes Absolute: 0.7 10*3/uL (ref 0.1–1.0)
Monocytes Relative: 11.7 % (ref 3.0–12.0)
Neutro Abs: 4.2 10*3/uL (ref 1.4–7.7)
Neutrophils Relative %: 67.8 % (ref 43.0–77.0)
Platelets: 178 10*3/uL (ref 150.0–400.0)
RBC: 4.85 Mil/uL (ref 3.87–5.11)
RDW: 17.5 % — ABNORMAL HIGH (ref 11.5–14.6)
WBC: 6.2 10*3/uL (ref 4.5–10.5)

## 2012-04-17 LAB — HEPATIC FUNCTION PANEL
ALT: 29 U/L (ref 0–35)
AST: 44 U/L — ABNORMAL HIGH (ref 0–37)
Albumin: 3.6 g/dL (ref 3.5–5.2)
Alkaline Phosphatase: 90 U/L (ref 39–117)
Bilirubin, Direct: 0.2 mg/dL (ref 0.0–0.3)
Total Bilirubin: 0.8 mg/dL (ref 0.3–1.2)
Total Protein: 7.4 g/dL (ref 6.0–8.3)

## 2012-04-17 LAB — URIC ACID: Uric Acid, Serum: 7.6 mg/dL — ABNORMAL HIGH (ref 2.4–7.0)

## 2012-04-17 LAB — TSH: TSH: 3.22 u[IU]/mL (ref 0.35–5.50)

## 2012-04-17 LAB — LDL CHOLESTEROL, DIRECT: Direct LDL: 148 mg/dL

## 2012-04-17 LAB — HEMOGLOBIN A1C: Hgb A1c MFr Bld: 6.5 % (ref 4.6–6.5)

## 2012-04-17 MED ORDER — TRAMADOL-ACETAMINOPHEN 37.5-325 MG PO TABS: 1.0000 | ORAL_TABLET | Freq: Four times a day (QID) | ORAL | Status: DC | PRN

## 2012-04-17 MED ORDER — ANASTROZOLE 1 MG PO TABS: 1.0000 mg | ORAL_TABLET | Freq: Every day | ORAL | Status: DC

## 2012-04-17 NOTE — Assessment & Plan Note (Signed)
stable overall by hx and exam, most recent data reviewed with pt, and pt to continue medical treatment as before Lab Results  Component Value Date   HGBA1C 6.5 04/17/2012

## 2012-04-17 NOTE — Assessment & Plan Note (Signed)

## 2012-04-17 NOTE — Telephone Encounter (Signed)
Please send to [facility]

## 2012-04-17 NOTE — Telephone Encounter (Signed)
Done erx 

## 2012-04-17 NOTE — Progress Notes (Signed)
Subjective:    Patient ID: [name], female    DOB: 1929-07-06, 78 y.o.   MRN: [id number]  HPI  /Here for wellness and f/u;  Overall doing ok;  Pt denies CP, worsening SOB, DOE, wheezing, orthopnea, PND, worsening LE edema, palpitations, dizziness or syncope.  Pt denies neurological change such as new Headache, facial or extremity weakness.  Pt denies polydipsia, polyuria, or low sugar symptoms. Pt states overall good compliance with treatment and medications, good tolerability, and trying to follow lower cholesterol diet.  Pt denies worsening depressive symptoms, suicidal ideation or panic. No fever, wt loss, night sweats, loss of appetite, or other constitutional symptoms.  Pt states good ability with ADL's, low fall risk, home safety reviewed and adequate, no significant changes in hearing or vision, and occasionally active with exercise.  Walking with walker,  No recent gout, asking for uric acid level.  For flu shot today.  Right breast chronic wound now healed. Past Medical History  Diagnosis Date  . Hypertension   . Hyperlipidemia   . Morbid obesity   . Osteoarthritis   . Gout   . CVA (cerebral infarction)   . IBS (irritable bowel syndrome)   . Diverticulosis   . Esophageal stricture   . Colon polyps     hyperplastic  . Anxiety   . Hiatal hernia     4 cm  . Shingles 02/21/2011  . Skin cancer   . Breast cancer     right breast  . Dysrhythmia     dr bensimhon   . Blood transfusion   . Chronic kidney disease     occ uti's  . GERD (gastroesophageal reflux disease)   . PONV (postoperative nausea and vomiting)   . DVT of leg (deep venous thrombosis)     left; S/P OR  . Atrial flutter     "sometimes"  . Bronchitis   . Pneumonia     "walking"  . Shortness of breath on exertion   . Anemia   . Stroke 2002    residual:  "little tingling in palm of left hand"  . Kidney stones 1990's  . UTI (lower urinary tract infection)     "I've had a few"   Past Surgical History    Procedure Date  . Abdominal hysterectomy   . Bile duct stent placement ~ 01/2011  . Pelvic bone tumor removal     twice in 2000's  . Colonic perforation repair ~ 2000  . Total hip arthroplasty 1980's    right  . Joint replacement   . Colon surgery     hole in intestine ,tumors?  . Tonsillectomy ~ 1946  . Cholecystectomy 03/23/11    lap chole   . Replacement total knee bilateral ~ 2008; ~ 2010    right; left  . Cataract extraction w/ intraocular lens  implant, bilateral 1990's  . Hemorrhoid surgery 2012  . Breast lumpectomy 06/29/11    right  . Breast lumpectomy 06/29/2011    Procedure: BREAST LUMPECTOMY WITH EXCISION OF SENTINEL NODE;  Surgeon: Pedro Earls, MD;  Location: [facility];  Service: General;  Laterality: Right;  right sentinel node mapping,right sentinel node biopsy, needle localization right breast lumpectomy  . Incision and drainage breast abscess     right breast seroma    reports that she has never smoked. She has never used smokeless tobacco. She reports that she does not drink alcohol or use illicit drugs. family history includes Breast cancer in her sister;  Diabetes in her sister; Heart disease in her maternal uncle and paternal uncle; Kidney failure in her sister; Prostate cancer in her brother; and Stroke in her mother.  There is no history of Colon cancer. Allergies  Allergen Reactions  . Ace Inhibitors Other (See Comments)    Tolerates lisinopril at home. Pt does not recall allergy.  . Atorvastatin     REACTION: myalgias  . Oxycodone-Acetaminophen     REACTION: confusion, fatigue  . Rosuvastatin     REACTION: leg weakness  . Simvastatin     REACTION: leg weakness  . Sulfonamide Derivatives   . Codeine Nausea And Vomiting and Rash   Current Outpatient Prescriptions on File Prior to Visit  Medication Sig Dispense Refill  . allopurinol (ZYLOPRIM) 100 MG tablet Take 100-200 mg by mouth every other day. Takes 1 tablet every other day then takes 2 tablets  every other day alternating      . amLODipine (NORVASC) 5 MG tablet Take 5 mg by mouth daily.        Marland Kitchen aspirin 325 MG tablet Take 325 mg by mouth daily.       . cholecalciferol (VITAMIN D) 1000 UNITS tablet Take 1,000 Units by mouth daily.      . furosemide (LASIX) 40 MG tablet Take 40 mg by mouth daily.      . hyaluronate sodium (RADIAPLEXRX) GEL Apply topically 2 (two) times daily.      Marland Kitchen lisinopril (PRINIVIL,ZESTRIL) 40 MG tablet Take 40 mg by mouth daily.      . non-metallic deodorant Jethro Poling) MISC Apply 1 application topically daily as needed.      . nystatin (MYCOSTATIN) powder Apply topically 4 (four) times daily.  15 g  0   Review of Systems Review of Systems  Constitutional: Negative for diaphoresis, activity change, appetite change and unexpected weight change.  HENT: Negative for hearing loss, ear pain, facial swelling, mouth sores and neck stiffness.   Eyes: Negative for pain, redness and visual disturbance.  Respiratory: Negative for shortness of breath and wheezing.   Cardiovascular: Negative for chest pain and palpitations.  Gastrointestinal: Negative for diarrhea, blood in stool, abdominal distention and rectal pain.  Genitourinary: Negative for hematuria, flank pain and decreased urine volume.  Musculoskeletal: Negative for myalgias and joint swelling.  Skin: Negative for color change and wound.  Neurological: Negative for syncope and numbness.  Hematological: Negative for adenopathy.  Psychiatric/Behavioral: Negative for hallucinations, self-injury, decreased concentration and agitation.      Objective:   Physical Exam BP 150/62  Pulse 77  Temp 98 F (36.7 C) (Oral)  Ht 5\' 6"  (1.676 m)  Wt 247 lb 2 oz (112.095 kg)  BMI 39.89 kg/m2  SpO2 95% Physical Exam  VS noted Constitutional: Pt is oriented to person, place, and time. Appears elderly, frail, obese, walks with walker.  HENT:  Head: Normocephalic and atraumatic.  Right Ear: External ear normal.  Left Ear:  External ear normal.  Nose: Nose normal.  Mouth/Throat: Oropharynx is clear and moist.  Eyes: Conjunctivae and EOM are normal. Pupils are equal, round, and reactive to light.  Neck: Normal range of motion. Neck supple. No JVD present. No tracheal deviation present.  Cardiovascular: Normal rate, regular rhythm, normal heart sounds and intact distal pulses.   Pulmonary/Chest: Effort normal and breath sounds normal.  Abdominal: Soft. Bowel sounds are normal. There is no tenderness.  Musculoskeletal: Normal range of motion. Exhibits no edema.  Lymphadenopathy:  Has no cervical adenopathy.  Neurological: Pt  is alert and oriented to person, place, and time. Pt has normal reflexes. No cranial nerve deficit.  Skin: Skin is warm and dry. No rash noted.  Psychiatric:  Has  normal mood and affect. Behavior is normal.     Assessment & Plan:

## 2012-04-17 NOTE — Patient Instructions (Addendum)
You had the flu shot today Please go to LAB in the Basement for the blood and/or urine tests to be done today You will be contacted by phone if any changes need to be made immediately.  Otherwise, you will receive a letter about your results with an explanation. Please remember to sign up for My Chart at your earliest convenience, as this will be important to you in the future with finding out test results. Please return in 6 mo with Lab testing done 3-5 days before

## 2012-04-17 NOTE — Telephone Encounter (Signed)
Pt request rx of Tramd/Acetamin 37.5-325mg  for pain, please send to [facility]

## 2012-04-18 NOTE — Telephone Encounter (Signed)
Patient informed rx sent in. 

## 2012-04-19 ENCOUNTER — Encounter: Payer: Self-pay | Admitting: Internal Medicine

## 2012-04-30 ENCOUNTER — Encounter (INDEPENDENT_AMBULATORY_CARE_PROVIDER_SITE_OTHER): Payer: Self-pay | Admitting: Surgery

## 2012-04-30 ENCOUNTER — Ambulatory Visit (INDEPENDENT_AMBULATORY_CARE_PROVIDER_SITE_OTHER): Payer: Medicare Other | Admitting: Surgery

## 2012-04-30 VITALS — BP 158/60 | HR 100 | Temp 96.8°F | Ht 66.0 in | Wt 227.8 lb

## 2012-04-30 DIAGNOSIS — C50911 Malignant neoplasm of unspecified site of right female breast: Secondary | ICD-10-CM

## 2012-04-30 DIAGNOSIS — C50919 Malignant neoplasm of unspecified site of unspecified female breast: Secondary | ICD-10-CM

## 2012-04-30 NOTE — Patient Instructions (Signed)
Thanks for your patience.  If you need further assistance after leaving the office, please call our office and speak with a [facility] nurse.  [phone].  If you want to leave a message for Dr. Paylin Hailu, please call his office phone at [phone]. 

## 2012-04-30 NOTE — Progress Notes (Signed)
[name] 78 y.o.  Body mass index is 36.77 kg/(m^2).  Patient Active Problem List  Diagnosis  . POLYP, COLON  . DIABETES MELLITUS, TYPE II  . HYPERLIPIDEMIA  . GOUT  . ANXIETY  . PERIPHERAL NEUROPATHY  . HYPERTENSION  . CVA  . DIVERTICULOSIS, COLON  . IRRITABLE BOWEL, PREDOMINANTLY CONSTIPATION  . FATIGUE  . COLONIC POLYPS, HX OF  . TREMOR  . Bile duct calculus with nonacute cholecystitis  . Morbid obesity  . Preventative health care  . Shingles  . Breast cancer, right breast-lobular s/p lumpectomy and sentinel node biopsy  . Cancer of upper-outer quadrant of female breast  . Diverticulosis of colon with hemorrhage  . Acute posthemorrhagic anemia  . Wound healing, delayed    Allergies  Allergen Reactions  . Ace Inhibitors Other (See Comments)    Tolerates lisinopril at home. Pt does not recall allergy.  . Atorvastatin     REACTION: myalgias  . Oxycodone-Acetaminophen     REACTION: confusion, fatigue  . Rosuvastatin     REACTION: leg weakness  . Simvastatin     REACTION: leg weakness  . Sulfonamide Derivatives   . Codeine Nausea And Vomiting and Rash    Past Surgical History  Procedure Date  . Abdominal hysterectomy   . Bile duct stent placement ~ 01/2011  . Pelvic bone tumor removal     twice in 2000's  . Colonic perforation repair ~ 2000  . Total hip arthroplasty 1980's    right  . Joint replacement   . Colon surgery     hole in intestine ,tumors?  . Tonsillectomy ~ 1946  . Cholecystectomy 03/23/11    lap chole   . Replacement total knee bilateral ~ 2008; ~ 2010    right; left  . Cataract extraction w/ intraocular lens  implant, bilateral 1990's  . Hemorrhoid surgery 2012  . Breast lumpectomy 06/29/11    right  . Breast lumpectomy 06/29/2011    Procedure: BREAST LUMPECTOMY WITH EXCISION OF SENTINEL NODE;  Surgeon: Pedro Earls, MD;  Location: [facility];  Service: General;  Laterality: Right;  right sentinel node mapping,right sentinel node biopsy,  needle localization right breast lumpectomy  . Incision and drainage breast abscess     right breast seroma   [name], MD No diagnosis found.  Incision remains healed after radiation therapy.  She had some sharp pains in her axilla but I feel no adenopathy.  This was after sleeping on her side.   I will recheck her in 6 months. Doing well [name], MD, [facility] [phone] beeper [phone]  04/30/2012 3:43 PM

## 2012-05-01 ENCOUNTER — Encounter: Payer: Self-pay | Admitting: Radiation Oncology

## 2012-05-01 ENCOUNTER — Ambulatory Visit
Admission: RE | Admit: 2012-05-01 | Discharge: 2012-05-01 | Disposition: A | Discharge status: Home / Self Care | Payer: Medicare Other | Source: Ambulatory Visit | Attending: Radiation Oncology | Admitting: Radiation Oncology

## 2012-05-01 VITALS — BP 150/65 | HR 78 | Temp 97.8°F | Resp 20 | Wt 249.5 lb

## 2012-05-01 DIAGNOSIS — C50419 Malignant neoplasm of upper-outer quadrant of unspecified female breast: Secondary | ICD-10-CM

## 2012-05-01 HISTORY — DX: Personal history of irradiation: Z92.3

## 2012-05-01 NOTE — Progress Notes (Signed)
Followup note:  [name] returns today approximately 1 month following completion of radiation therapy following conservative surgery in the management of her T2 N0 invasive ductal/DCIS of the right breast. She tells me she saw Dr. Hassell Done yesterday and she will see him again in 6 months. She is scheduled see Dr. Truddie Coco in January. She is on anastrozole. Her last screening mammogram was on 05/23/2011.  Physical examination: Alert and oriented. Wt Readings from Last 3 Encounters:  05/01/12 249 lb 8 oz (113.172 kg)  04/30/12 227 lb 12.8 oz (103.329 kg)  04/17/12 247 lb 2 oz (112.095 kg)   Temp Readings from Last 3 Encounters:  05/01/12 97.8 F (36.6 C) Oral  04/30/12 96.8 F (36 C) Temporal  04/17/12 98 F (36.7 C) Oral   BP Readings from Last 3 Encounters:  05/01/12 150/65  04/30/12 158/60  04/17/12 150/62   Pulse Readings from Last 3 Encounters:  05/01/12 78  04/30/12 100  04/17/12 77   Head and neck examination: Grossly unremarkable. Nodes: Without palpable cervical, supraclavicular, or axillary lymphadenopathy. Chest: Lungs clear.: There is residual hyperpigmentation and erythema along the right breast with mild thickening. No masses are appreciated. Left breast without masses or lesions. Abdomen obese without hepatomegaly. Extremities without edema.  Impression: Satisfactory progress. I think she should have a baseline right breast mammogram and screening left breast mammogram in January. This can be arranged/scheduled by Dr. Truddie Coco when she sees him in January. She is not scheduled for a formal followup visit with me, and she will see Dr. Hassell Done in 6 months.

## 2012-05-01 NOTE — Progress Notes (Signed)
Patient here follow up  Right breat cancer rad txs: 02/15/2012-04/02/2012, 4680 cGy 26 sessions, right breast boost=1400 cGy /7 sessions, alert,oriented x3, right breast well healed, saw Dr.Martin yesterday, To see her back in 6 months, breast well healed,skin intact,

## 2012-06-08 ENCOUNTER — Telehealth: Payer: Self-pay | Admitting: *Deleted

## 2012-06-08 NOTE — Telephone Encounter (Signed)
Patient confirmed over the phone that the 07-05-2012 has been cancelled

## 2012-06-11 ENCOUNTER — Ambulatory Visit (INDEPENDENT_AMBULATORY_CARE_PROVIDER_SITE_OTHER): Payer: Medicare Other | Admitting: Internal Medicine

## 2012-06-11 ENCOUNTER — Ambulatory Visit (INDEPENDENT_AMBULATORY_CARE_PROVIDER_SITE_OTHER)
Admission: RE | Admit: 2012-06-11 | Discharge: 2012-06-11 | Disposition: A | Discharge status: Home / Self Care | Payer: Medicare Other | Source: Ambulatory Visit | Attending: Internal Medicine | Admitting: Internal Medicine

## 2012-06-11 ENCOUNTER — Encounter: Payer: Self-pay | Admitting: Internal Medicine

## 2012-06-11 VITALS — BP 126/68 | HR 86 | Temp 97.8°F | Resp 16 | Wt 242.0 lb

## 2012-06-11 DIAGNOSIS — R05 Cough: Secondary | ICD-10-CM

## 2012-06-11 DIAGNOSIS — R059 Cough, unspecified: Secondary | ICD-10-CM

## 2012-06-11 DIAGNOSIS — J209 Acute bronchitis, unspecified: Secondary | ICD-10-CM

## 2012-06-11 MED ORDER — CEFUROXIME AXETIL 500 MG PO TABS: 500.0000 mg | ORAL_TABLET | Freq: Two times a day (BID) | ORAL | Status: DC

## 2012-06-11 NOTE — Patient Instructions (Signed)

## 2012-06-12 NOTE — Assessment & Plan Note (Signed)
I will check her CXR to see if she has pna,mass,edema

## 2012-06-12 NOTE — Assessment & Plan Note (Signed)
Start ceftin for the infection She does not want a cough suppressant

## 2012-06-12 NOTE — Progress Notes (Signed)
  Subjective:    Patient ID: [name], female    DOB: [birth year], 78 y.o.   MRN: [id number]  Cough This is a new problem. The current episode started in the past 7 days. The problem has been gradually worsening. The problem occurs every few hours. The cough is productive of purulent sputum. Associated symptoms include chills, nasal congestion, postnasal drip and rhinorrhea. Pertinent negatives include no chest pain, ear congestion, ear pain, fever, headaches, heartburn, hemoptysis, myalgias, rash, sore throat, shortness of breath, sweats, weight loss or wheezing. The symptoms are aggravated by cold air. She has tried OTC cough suppressant for the symptoms. The treatment provided moderate relief.      Review of Systems  Constitutional: Positive for chills and fatigue. Negative for fever, weight loss, diaphoresis, activity change, appetite change and unexpected weight change.  HENT: Positive for rhinorrhea, postnasal drip and sinus pressure. Negative for ear pain, sore throat, sneezing, trouble swallowing and dental problem.   Eyes: Negative.   Respiratory: Positive for cough. Negative for apnea, hemoptysis, choking, chest tightness, shortness of breath, wheezing and stridor.   Cardiovascular: Negative for chest pain, palpitations and leg swelling.  Gastrointestinal: Negative.  Negative for heartburn, nausea, vomiting, abdominal pain, diarrhea and constipation.  Genitourinary: Negative.   Musculoskeletal: Negative.  Negative for myalgias.  Skin: Negative for rash.  Neurological: Negative.  Negative for headaches.  Hematological: Negative.   Psychiatric/Behavioral: Negative.        Objective:   Physical Exam  Vitals reviewed. Constitutional: She is oriented to person, place, and time. She appears well-developed and well-nourished.  Non-toxic appearance. She does not have a sickly appearance. She does not appear ill. No distress.  HENT:  Head: Normocephalic and atraumatic. No trismus  in the jaw.  Right Ear: Hearing, tympanic membrane, external ear and ear canal normal.  Left Ear: Hearing, tympanic membrane, external ear and ear canal normal.  Nose: Mucosal edema and rhinorrhea present. Right sinus exhibits no maxillary sinus tenderness and no frontal sinus tenderness. Left sinus exhibits no maxillary sinus tenderness and no frontal sinus tenderness.  Mouth/Throat: Oropharynx is clear and moist and mucous membranes are normal. Mucous membranes are not pale, not dry and not cyanotic. No oral lesions. No uvula swelling. No oropharyngeal exudate, posterior oropharyngeal edema, posterior oropharyngeal erythema or tonsillar abscesses.  Eyes: Conjunctivae normal are normal. Right eye exhibits no discharge. Left eye exhibits no discharge. No scleral icterus.  Neck: Normal range of motion. Neck supple. No JVD present. No tracheal deviation present. No thyromegaly present.  Cardiovascular: Normal rate, regular rhythm, normal heart sounds and intact distal pulses.  Exam reveals no gallop and no friction rub.   No murmur heard. Pulmonary/Chest: Effort normal and breath sounds normal. No stridor. No respiratory distress. She has no wheezes. She has no rales. She exhibits no tenderness.  Abdominal: Soft. Bowel sounds are normal. She exhibits no distension and no mass. There is no tenderness. There is no rebound and no guarding.  Musculoskeletal: Normal range of motion. She exhibits no edema and no tenderness.  Lymphadenopathy:    She has no cervical adenopathy.  Neurological: She is oriented to person, place, and time.  Skin: Skin is warm and dry. No rash noted. She is not diaphoretic. No erythema. No pallor.  Psychiatric: She has a normal mood and affect. Her behavior is normal. Judgment and thought content normal.          Assessment & Plan:

## 2012-06-20 ENCOUNTER — Other Ambulatory Visit (INDEPENDENT_AMBULATORY_CARE_PROVIDER_SITE_OTHER): Payer: Self-pay | Admitting: Surgery

## 2012-06-20 DIAGNOSIS — Z9889 Other specified postprocedural states: Secondary | ICD-10-CM

## 2012-06-20 DIAGNOSIS — Z853 Personal history of malignant neoplasm of breast: Secondary | ICD-10-CM

## 2012-06-25 ENCOUNTER — Encounter (HOSPITAL_COMMUNITY): Payer: Self-pay

## 2012-06-25 ENCOUNTER — Emergency Department (HOSPITAL_COMMUNITY)
Admission: EM | Admit: 2012-06-25 | Discharge: 2012-06-25 | Disposition: A | Discharge status: Home / Self Care | Payer: Medicare Other | Attending: Emergency Medicine | Admitting: Emergency Medicine

## 2012-06-25 DIAGNOSIS — Z8659 Personal history of other mental and behavioral disorders: Secondary | ICD-10-CM | POA: Insufficient documentation

## 2012-06-25 DIAGNOSIS — Z8619 Personal history of other infectious and parasitic diseases: Secondary | ICD-10-CM | POA: Insufficient documentation

## 2012-06-25 DIAGNOSIS — Z86718 Personal history of other venous thrombosis and embolism: Secondary | ICD-10-CM | POA: Insufficient documentation

## 2012-06-25 DIAGNOSIS — Z87440 Personal history of urinary (tract) infections: Secondary | ICD-10-CM | POA: Insufficient documentation

## 2012-06-25 DIAGNOSIS — Z923 Personal history of irradiation: Secondary | ICD-10-CM | POA: Insufficient documentation

## 2012-06-25 DIAGNOSIS — Z860100 Personal history of colon polyps, unspecified: Secondary | ICD-10-CM | POA: Insufficient documentation

## 2012-06-25 DIAGNOSIS — Z862 Personal history of diseases of the blood and blood-forming organs and certain disorders involving the immune mechanism: Secondary | ICD-10-CM | POA: Insufficient documentation

## 2012-06-25 DIAGNOSIS — R040 Epistaxis: Secondary | ICD-10-CM

## 2012-06-25 DIAGNOSIS — Z8639 Personal history of other endocrine, nutritional and metabolic disease: Secondary | ICD-10-CM | POA: Insufficient documentation

## 2012-06-25 DIAGNOSIS — Z8679 Personal history of other diseases of the circulatory system: Secondary | ICD-10-CM | POA: Insufficient documentation

## 2012-06-25 DIAGNOSIS — Z853 Personal history of malignant neoplasm of breast: Secondary | ICD-10-CM | POA: Insufficient documentation

## 2012-06-25 DIAGNOSIS — Z8709 Personal history of other diseases of the respiratory system: Secondary | ICD-10-CM | POA: Insufficient documentation

## 2012-06-25 DIAGNOSIS — Z87442 Personal history of urinary calculi: Secondary | ICD-10-CM | POA: Insufficient documentation

## 2012-06-25 DIAGNOSIS — M109 Gout, unspecified: Secondary | ICD-10-CM | POA: Insufficient documentation

## 2012-06-25 DIAGNOSIS — Z79899 Other long term (current) drug therapy: Secondary | ICD-10-CM | POA: Insufficient documentation

## 2012-06-25 DIAGNOSIS — Z8719 Personal history of other diseases of the digestive system: Secondary | ICD-10-CM | POA: Insufficient documentation

## 2012-06-25 DIAGNOSIS — I129 Hypertensive chronic kidney disease with stage 1 through stage 4 chronic kidney disease, or unspecified chronic kidney disease: Secondary | ICD-10-CM | POA: Insufficient documentation

## 2012-06-25 DIAGNOSIS — N189 Chronic kidney disease, unspecified: Secondary | ICD-10-CM | POA: Insufficient documentation

## 2012-06-25 DIAGNOSIS — Z8673 Personal history of transient ischemic attack (TIA), and cerebral infarction without residual deficits: Secondary | ICD-10-CM | POA: Insufficient documentation

## 2012-06-25 DIAGNOSIS — Z8701 Personal history of pneumonia (recurrent): Secondary | ICD-10-CM | POA: Insufficient documentation

## 2012-06-25 DIAGNOSIS — Z7982 Long term (current) use of aspirin: Secondary | ICD-10-CM | POA: Insufficient documentation

## 2012-06-25 MED ORDER — OXYMETAZOLINE HCL 0.05 % NA SOLN
2.0000 | Freq: Once | NASAL | Status: AC
  Administered 2012-06-25: 2 via NASAL
  Filled 2012-06-25: qty 15

## 2012-06-25 NOTE — ED Notes (Signed)
Pt states she's had a cold and has been getting nosebleeds, but this morning could not get bleeding to stop.  BP  188/132, P  106, R  18, SPO2  94%.  Pt with hx of HTN, but did not take meds this morning.  EMS suctioned blood from mouth, but not significant amount.  EKG = ST.  18g to Left AC.  Negative stroke scale.  Ice pack applied to nose.  Pt on blood thinners.

## 2012-06-25 NOTE — ED Notes (Signed)
Pt reports she is feeling a little better. Nose bleed has stopped. Pt in nad.

## 2012-06-25 NOTE — ED Provider Notes (Signed)
History     CSN: [id number]  Arrival date & time 06/25/12  0840   First MD Initiated Contact with Patient 06/25/12 [phone]      Chief Complaint  Patient presents with  . Epistaxis    (Consider location/radiation/quality/duration/timing/severity/associated sxs/prior treatment) HPI Pt with history of multiple medical problems reports she has had cold symptoms for the last couple of weeks, associated with intermittent R anterior nose bleed. She stopped taking her ASA due to bleeding, but this morning, bleeding was more severe and she was unable to get it to stop. She denies any injury, no CP, SOB or lightheadedness. She states bleeding is mostly coming anteriorly, but occasionally down the back of her throat when she is lying down.  Past Medical History  Diagnosis Date  . Hypertension   . Hyperlipidemia   . Morbid obesity   . Osteoarthritis   . Gout   . CVA (cerebral infarction)   . IBS (irritable bowel syndrome)   . Diverticulosis   . Esophageal stricture   . Colon polyps     hyperplastic  . Anxiety   . Hiatal hernia     4 cm  . Shingles 02/21/2011  . Skin cancer   . Breast cancer     right breast  . Dysrhythmia     dr bensimhon   . Blood transfusion   . Chronic kidney disease     occ uti's  . GERD (gastroesophageal reflux disease)   . PONV (postoperative nausea and vomiting)   . DVT of leg (deep venous thrombosis)     left; S/P OR  . Atrial flutter     "sometimes"  . Bronchitis   . Pneumonia     "walking"  . Shortness of breath on exertion   . Anemia   . Stroke 2002    residual:  "little tingling in palm of left hand"  . Kidney stones 1990's  . UTI (lower urinary tract infection)     "I've had a few"  . History of radiation therapy 02/15/12-04/02/12    right breast 4680 cGy/26 sessions, right boost=1400cGy /7 sessions    Past Surgical History  Procedure Date  . Abdominal hysterectomy   . Bile duct stent placement ~ 01/2011  . Pelvic bone tumor removal    twice in 2000's  . Colonic perforation repair ~ 2000  . Total hip arthroplasty 1980's    right  . Joint replacement   . Colon surgery     hole in intestine ,tumors?  . Tonsillectomy ~ 1946  . Cholecystectomy 03/23/11    lap chole   . Replacement total knee bilateral ~ 2008; ~ 2010    right; left  . Cataract extraction w/ intraocular lens  implant, bilateral 1990's  . Hemorrhoid surgery 2012  . Breast lumpectomy 06/29/11    right  . Breast lumpectomy 06/29/2011    Procedure: BREAST LUMPECTOMY WITH EXCISION OF SENTINEL NODE;  Surgeon: Pedro Earls, MD;  Location: [facility];  Service: General;  Laterality: Right;  right sentinel node mapping,right sentinel node biopsy, needle localization right breast lumpectomy  . Incision and drainage breast abscess     right breast seroma    Family History  Problem Relation Age of Onset  . Breast cancer Sister     x 2  . Diabetes Sister   . Prostate cancer Brother   . Colon cancer Neg Hx   . Stroke Mother   . Kidney failure Sister   . Heart disease Paternal  Uncle     multiple  . Heart disease Maternal Uncle     multiple    History  Substance Use Topics  . Smoking status: Never Smoker   . Smokeless tobacco: Never Used  . Alcohol Use: No    OB History    Grav Para Term Preterm Abortions TAB SAB Ect Mult Living                  Review of Systems All other systems reviewed and are negative except as noted in HPI.   Allergies  Ace inhibitors; Atorvastatin; Oxycodone-acetaminophen; Rosuvastatin; Simvastatin; Codeine; and Sulfonamide derivatives  Home Medications   Current Outpatient Rx  Name  Route  Sig  Dispense  Refill  . ALLOPURINOL 100 MG PO TABS   Oral   Take 100-200 mg by mouth every other day. Takes 1 tablet every other day then takes 2 tablets every other day alternating         . ANASTROZOLE 1 MG PO TABS   Oral   Take 1 tablet (1 mg total) by mouth daily.   30 tablet   4   . ASPIRIN 325 MG PO TABS   Oral    Take 325 mg by mouth daily.          Marland Kitchen VITAMIN D 1000 UNITS PO TABS   Oral   Take 1,000 Units by mouth daily.         . FUROSEMIDE 40 MG PO TABS   Oral   Take 40 mg by mouth daily.         Marland Kitchen LISINOPRIL 40 MG PO TABS   Oral   Take 40 mg by mouth daily.         . TRAMADOL-ACETAMINOPHEN 37.5-325 MG PO TABS   Oral   Take 1 tablet by mouth every 6 (six) hours as needed for pain.   120 tablet   2     BP 186/73  Pulse 96  Temp 98.3 F (36.8 C)  Resp 16  SpO2 95%  Physical Exam  Nursing note and vitals reviewed. Constitutional: She is oriented to person, place, and time. She appears well-developed and well-nourished.  HENT:  Head: Normocephalic and atraumatic.       No active bleeding from nares, but there is dried blood around the R nare and clot within the nose.   Eyes: EOM are normal. Pupils are equal, round, and reactive to light.  Neck: Normal range of motion. Neck supple.  Cardiovascular: Normal rate, normal heart sounds and intact distal pulses.   Pulmonary/Chest: Effort normal and breath sounds normal.  Abdominal: Bowel sounds are normal. She exhibits no distension. There is no tenderness.  Musculoskeletal: Normal range of motion. She exhibits no edema and no tenderness.  Neurological: She is alert and oriented to person, place, and time. She has normal strength. No cranial nerve deficit or sensory deficit.  Skin: Skin is warm and dry. No rash noted.  Psychiatric: She has a normal mood and affect.    ED Course  Procedures (including critical care time)  Labs Reviewed - No data to display No results found.   No diagnosis found.    MDM  Clots blown out from R nare and afrin instilled. No source of active bleeding on gross examination. Will observe for rebleeding.    10:57 AM On re-eval, small area of bleeding on R anterior nasal septum was cauterized with silver nitrate.    11:40 AM No further bleeding. Ready  for discharge. Advised topical  antibiotic ointment and return for re-bleeding.   Brionna Romanek B. Karle Starch, MD 06/25/12 1141

## 2012-06-27 ENCOUNTER — Encounter: Payer: Self-pay | Admitting: Internal Medicine

## 2012-06-27 ENCOUNTER — Ambulatory Visit (INDEPENDENT_AMBULATORY_CARE_PROVIDER_SITE_OTHER): Payer: Medicare Other | Admitting: Internal Medicine

## 2012-06-27 VITALS — BP 152/70 | HR 129 | Temp 98.0°F | Ht 66.0 in | Wt 246.0 lb

## 2012-06-27 DIAGNOSIS — J3489 Other specified disorders of nose and nasal sinuses: Secondary | ICD-10-CM | POA: Insufficient documentation

## 2012-06-27 DIAGNOSIS — I10 Essential (primary) hypertension: Secondary | ICD-10-CM

## 2012-06-27 DIAGNOSIS — R040 Epistaxis: Secondary | ICD-10-CM

## 2012-06-27 MED ORDER — AZITHROMYCIN 250 MG PO TABS: ORAL_TABLET | ORAL | Status: DC

## 2012-06-27 MED ORDER — AMLODIPINE BESYLATE 5 MG PO TABS: 5.0000 mg | ORAL_TABLET | Freq: Every day | ORAL | Status: DC

## 2012-06-27 MED ORDER — ASPIRIN 81 MG PO TABS: ORAL_TABLET | ORAL | Status: DC

## 2012-06-27 NOTE — Assessment & Plan Note (Signed)
Mild to mod, for antibx course,  to f/u any worsening symptoms or concerns 

## 2012-06-27 NOTE — Progress Notes (Signed)
Subjective:    Patient ID: [name], female    DOB: 07-May-1930, 79 y.o.   MRN: [id number]  HPI  Here to f/u recent ER vitis with epistaxis, none further since cautery in ER;  Pt has taken herself off ASA 325 qd for last 5 days, afraid to take, but would consider taking 2 of the Baby aspirin.  Has hx of signficant rectal bleeding on the ASA 325 as well per hemorrhoid, had to go to ER, no surgury required.  Pt denies chest pain, increased sob or doe, wheezing, orthopnea, PND, increased LE swelling, palpitations, dizziness or syncope.   Pt denies polydipsia, polyuria,.  Pt states overall good compliance with meds, wt overall stable, no further bleeding or bruising.  Walks well with walker for balance and stability, no recent falls.  Pt denies fever, though still with sinus discomfort and greenish d/c per pt.     Past Medical History  Diagnosis Date  . Hypertension   . Hyperlipidemia   . Morbid obesity   . Osteoarthritis   . Gout   . CVA (cerebral infarction)   . IBS (irritable bowel syndrome)   . Diverticulosis   . Esophageal stricture   . Colon polyps     hyperplastic  . Anxiety   . Hiatal hernia     4 cm  . Shingles 02/21/2011  . Skin cancer   . Breast cancer     right breast  . Dysrhythmia     dr bensimhon   . Blood transfusion   . Chronic kidney disease     occ uti's  . GERD (gastroesophageal reflux disease)   . PONV (postoperative nausea and vomiting)   . DVT of leg (deep venous thrombosis)     left; S/P OR  . Atrial flutter     "sometimes"  . Bronchitis   . Pneumonia     "walking"  . Shortness of breath on exertion   . Anemia   . Stroke 2002    residual:  "little tingling in palm of left hand"  . Kidney stones 1990's  . UTI (lower urinary tract infection)     "I've had a few"  . History of radiation therapy 02/15/12-04/02/12    right breast 4680 cGy/26 sessions, right boost=1400cGy /7 sessions   Past Surgical History  Procedure Date  . Abdominal hysterectomy    . Bile duct stent placement ~ 01/2011  . Pelvic bone tumor removal     twice in 2000's  . Colonic perforation repair ~ 2000  . Total hip arthroplasty 1980's    right  . Joint replacement   . Colon surgery     hole in intestine ,tumors?  . Tonsillectomy ~ 1946  . Cholecystectomy 03/23/11    lap chole   . Replacement total knee bilateral ~ 2008; ~ 2010    right; left  . Cataract extraction w/ intraocular lens  implant, bilateral 1990's  . Hemorrhoid surgery 2012  . Breast lumpectomy 06/29/11    right  . Breast lumpectomy 06/29/2011    Procedure: BREAST LUMPECTOMY WITH EXCISION OF SENTINEL NODE;  Surgeon: Pedro Earls, MD;  Location: [facility];  Service: General;  Laterality: Right;  right sentinel node mapping,right sentinel node biopsy, needle localization right breast lumpectomy  . Incision and drainage breast abscess     right breast seroma    reports that she has never smoked. She has never used smokeless tobacco. She reports that she does not drink alcohol or use  illicit drugs. family history includes Breast cancer in her sister; Diabetes in her sister; Heart disease in her maternal uncle and paternal uncle; Kidney failure in her sister; Prostate cancer in her brother; and Stroke in her mother.  There is no history of Colon cancer. Allergies  Allergen Reactions  . Ace Inhibitors Other (See Comments)    Tolerates lisinopril at home. Pt does not recall allergy.  . Atorvastatin     REACTION: myalgias  . Oxycodone-Acetaminophen     REACTION: confusion, fatigue  . Rosuvastatin     REACTION: leg weakness  . Simvastatin     REACTION: leg weakness  . Codeine Nausea And Vomiting and Rash  . Sulfonamide Derivatives Hives and Rash   Review of Systems  Constitutional: Negative for unexpected weight change, or unusual diaphoresis  HENT: Negative for tinnitus.   Eyes: Negative for photophobia and visual disturbance.  Respiratory: Negative for choking and stridor.   Gastrointestinal:  Negative for vomiting and blood in stool.  Genitourinary: Negative for hematuria and decreased urine volume.  Musculoskeletal: Negative for acute joint swelling Skin: Negative for color change and wound.  Neurological: Negative for tremors and numbness other than noted  Psychiatric/Behavioral: Negative for decreased concentration or  hyperactivity.       Objective:   Physical Exam BP 152/70  Pulse 129  Temp 98 F (36.7 C) (Oral)  Ht 5\' 6"  (1.676 m)  Wt 246 lb (111.585 kg)  BMI 39.71 kg/m2  SpO2 98% VS noted, walks with walker Constitutional: Pt appears well-developed and well-nourished.  HENT: Head: NCAT.  Right Ear: External ear normal.  Left Ear: External ear normal.  Eyes: Conjunctivae and EOM are normal. Pupils are equal, round, and reactive to light.  Neck: Normal range of motion. Neck supple.  Cardiovascular: Normal rate and regular rhythm.   Pulmonary/Chest: Effort normal and breath sounds normal.  Neurological: Pt is alert. Not confused  Skin: Skin is warm. No erythema.  Psychiatric: Pt behavior is normal. Thought content normal.     Assessment & Plan:

## 2012-06-27 NOTE — Patient Instructions (Addendum)
Please take all new medication as prescribed - the antibiotic, as well as the new medication for Blood Pressure (amlodipine) (both sent to [facility]) OK to take 2 of the Aspirin 81 mg's per day (instead of the 325 mg) Please call for ENT referral if the bleeding reoccurs Please return if bleeding reoccurs to consider having a blood count done Please continue all other medications as before, and refills have been done if requested. Please have the pharmacy call with any other refills you may need.

## 2012-06-27 NOTE — Assessment & Plan Note (Signed)
Mild elevated today, stable overall by history and exam, recent data reviewed with pt, and pt to asdd amlod 5 qd for better control,  to f/u any worsening symptoms or concerns BP Readings from Last 3 Encounters:  06/27/12 152/70  06/25/12 153/52  06/11/12 126/68

## 2012-06-27 NOTE — Assessment & Plan Note (Addendum)
Resolved after ER visit with cautery, no further bleeding, asympt, no need f/u labs today, for now ok for asa 81 mg - 2 per day (decliens to take 325 further)

## 2012-07-02 ENCOUNTER — Ambulatory Visit
Admission: RE | Admit: 2012-07-02 | Discharge: 2012-07-02 | Disposition: A | Discharge status: Home / Self Care | Payer: Medicare Other | Source: Ambulatory Visit | Attending: Surgery | Admitting: Surgery

## 2012-07-02 DIAGNOSIS — Z853 Personal history of malignant neoplasm of breast: Secondary | ICD-10-CM

## 2012-07-02 DIAGNOSIS — Z9889 Other specified postprocedural states: Secondary | ICD-10-CM

## 2012-07-05 ENCOUNTER — Ambulatory Visit: Payer: Medicare Other | Admitting: Oncology

## 2012-07-05 ENCOUNTER — Other Ambulatory Visit: Payer: Medicare Other | Admitting: Lab

## 2012-07-18 ENCOUNTER — Telehealth: Payer: Self-pay | Admitting: *Deleted

## 2012-07-18 NOTE — Telephone Encounter (Signed)
Called and spoke with patient to reschedule her appt. Confirmed appt for 07/31/12 at 315 with Joylene John, NP.  Then will become Dr.Magrinat.

## 2012-07-19 ENCOUNTER — Encounter: Payer: Self-pay | Admitting: Oncology

## 2012-07-31 ENCOUNTER — Encounter: Payer: Self-pay | Admitting: Gynecologic Oncology

## 2012-07-31 ENCOUNTER — Telehealth: Payer: Self-pay | Admitting: Oncology

## 2012-07-31 ENCOUNTER — Ambulatory Visit (HOSPITAL_BASED_OUTPATIENT_CLINIC_OR_DEPARTMENT_OTHER): Payer: Medicare Other | Admitting: Gynecologic Oncology

## 2012-07-31 VITALS — BP 175/88 | HR 89 | Temp 97.6°F | Resp 20 | Ht 66.0 in | Wt 248.6 lb

## 2012-07-31 DIAGNOSIS — C50919 Malignant neoplasm of unspecified site of unspecified female breast: Secondary | ICD-10-CM

## 2012-07-31 DIAGNOSIS — Z170 Estrogen receptor positive status [ER+]: Secondary | ICD-10-CM

## 2012-07-31 DIAGNOSIS — C50419 Malignant neoplasm of upper-outer quadrant of unspecified female breast: Secondary | ICD-10-CM

## 2012-07-31 NOTE — Telephone Encounter (Signed)
gv pt appt schedule for August.  °

## 2012-07-31 NOTE — Patient Instructions (Addendum)
Doing well.  Continue taking Arimidex as prescribed.  Try taking red yeast rice over the counter to lower your cholesterol per Dr. Jana Hakim.  Plan to follow up in 6 months with lab work at that time or sooner if needed.

## 2012-07-31 NOTE — Progress Notes (Signed)
ID: YEXALEN HELMING   DOB: [birth year]  MR#: [id number]  [id number]  PCP: [name], MD GYN:  None  SU: Dr. Rockne Coons   HISTORY OF PRESENT ILLNESS:  [name] is a 79 year old [place] woman who underwent screening mammography in December of 2012 and a follow up right mammogram was recommended.  The mammogram and ultrasound demonstrated a area of abnormality 7 cm from the nipple measuring 1.5 x 1.3 x 1.1 cm with ultrasound of the axilla negative.  A biopsy of the mass performed on 06/09/2011 showed invasive ductal carcinoma, grade I-II,  ER + 100%, PR +100%, Ki-67 9%, HER-2 negative with ratio of 1.20.  MRI scans of the breasts on 06/17/2011 showed a mass measuring 2.8 x 1.7 x 1.3 cm with no other abnormalities seen. The patient underwent a right lumpectomy with sentinel lymph node evaluation on 06/29/2011 by Dr. Hassell Done with final pathology showing a 2.8 cm, grade 1 invasive ductal carcinoma with no evidence of angiolymphatic invasion with closest surgical margins of 0.2cm. One sentinel lymph node identified was negative for malignancy.  The patient's postoperative course was complicated by a hematoma and she completed a course of antibiotics.  She completed radiation under the care of Dr. Valere Dross from 02/15/12 to 04/02/12.  She was started on Arimidex 1 mg daily in 04/2012.  INTERVAL HISTORY:  She presents for continued follow up today.  She is tolerating Arimidex well with no hot flashes or vaginal dryness reported.  She reports intermittent joint pain that she relates to her arthritis.  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  She reports an episode of epistaxis one month ago that she went to the ER via EMS.  She denies any recent signs of active bleeding and states that she takes alternates two baby aspirins one day then three the next day.  She saw Dr. Hassell Done last on 04/30/12.  When discussing her last lipid panel in 04/2012, she states "I can't take any medicine for my cholesterol  because it makes my legs weak."  REVIEW OF SYSTEMS: Constitutional: Feels well.  Cardiovascular: Mild lower extremity edema.  No chest pain or shortness of breath.  Pulmonary: No cough or wheeze.  Gastrointestinal: No nausea, vomiting, or diarrhea. No bright red blood per rectum or change in bowel movement.  Genitourinary: No frequency, urgency, or dysuria. No vaginal bleeding or discharge.  Musculoskeletal: Intermittent joint pain related to arthritis per pt.  No myalgia. Neurologic: No weakness, numbness, or change in gait.  Psychology: No depression, anxiety, or insomnia  PAST MEDICAL HISTORY: Past Medical History  Diagnosis Date  . Hypertension   . Hyperlipidemia   . Morbid obesity   . Osteoarthritis   . Gout   . CVA (cerebral infarction)   . IBS (irritable bowel syndrome)   . Diverticulosis   . Esophageal stricture   . Colon polyps     hyperplastic  . Anxiety   . Hiatal hernia     4 cm  . Shingles 02/21/2011  . Skin cancer   . Breast cancer     right breast  . Dysrhythmia     dr bensimhon   . Blood transfusion   . Chronic kidney disease     occ uti's  . GERD (gastroesophageal reflux disease)   . PONV (postoperative nausea and vomiting)   . DVT of leg (deep venous thrombosis)     left; S/P OR  . Atrial flutter     "sometimes"  .  Bronchitis   . Pneumonia     "walking"  . Shortness of breath on exertion   . Anemia   . Stroke 2002    residual:  "little tingling in palm of left hand"  . Kidney stones 1990's  . UTI (lower urinary tract infection)     "I've had a few"  . History of radiation therapy 02/15/12-04/02/12    right breast 4680 cGy/26 sessions, right boost=1400cGy /7 sessions    PAST SURGICAL HISTORY: Past Surgical History  Procedure Laterality Date  . Abdominal hysterectomy    . Bile duct stent placement  ~ 01/2011  . Pelvic bone tumor removal      twice in 2000's  . Colonic perforation repair  ~ 2000  . Total hip arthroplasty  1980's    right   . Joint replacement    . Colon surgery      hole in intestine ,tumors?  . Tonsillectomy  ~ 1946  . Cholecystectomy  03/23/11    lap chole   . Replacement total knee bilateral  ~ 2008; ~ 2010    right; left  . Cataract extraction w/ intraocular lens  implant, bilateral  1990's  . Hemorrhoid surgery  2012  . Breast lumpectomy  06/29/11    right  . Breast lumpectomy  06/29/2011    Procedure: BREAST LUMPECTOMY WITH EXCISION OF SENTINEL NODE;  Surgeon: Pedro Earls, MD;  Location: [facility];  Service: General;  Laterality: Right;  right sentinel node mapping,right sentinel node biopsy, needle localization right breast lumpectomy  . Incision and drainage breast abscess      right breast seroma    FAMILY HISTORY Family History  Problem Relation Age of Onset  . Breast cancer Sister     x 2  . Diabetes Sister   . Prostate cancer Brother   . Colon cancer Neg Hx   . Stroke Mother   . Kidney failure Sister   . Heart disease Paternal Uncle     multiple  . Heart disease Maternal Uncle     multiple    GYNECOLOGIC HISTORY:  G 5 P 2, menarche-15, menopause at time of hysterectomy, no long term use of HRT  SOCIAL HISTORY:  She lives at home with her husband and has been married for over 84 years.  She has two sons: one who lives in [place] and one who lives 2 miles away from her.  She continues to cut hair, with a maximum of 5 people a day.  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    ADVANCED DIRECTIVES:  Living will.  HEALTH MAINTENANCE: History  Substance Use Topics  . Smoking status: Never Smoker   . Smokeless tobacco: Never Used  . Alcohol Use: No     Colonoscopy:  Last year due to rectal bleeding, which was related to hemorrhoids  PAP:  Has had hysterectomy  Bone density:  08/2011 resulting normal  Lipid panel:  04/2012 with total cholesterol of 219, trig 276, HDL 31.30, LDL 55.2  Allergies  Allergen Reactions  . Ace Inhibitors Other (See Comments)    Tolerates lisinopril at  home. Pt does not recall allergy.  . Atorvastatin     REACTION: myalgias  . Oxycodone-Acetaminophen     REACTION: confusion, fatigue  . Rosuvastatin     REACTION: leg weakness  . Simvastatin     REACTION: leg weakness  . Codeine Nausea And Vomiting and Rash  . Sulfonamide Derivatives Hives and Rash    Current  Outpatient Prescriptions  Medication Sig Dispense Refill  . allopurinol (ZYLOPRIM) 100 MG tablet Take 100-200 mg by mouth every other day. Takes 1 tablet every other day then takes 2 tablets every other day alternating      . amLODipine (NORVASC) 5 MG tablet Take 1 tablet (5 mg total) by mouth daily.  90 tablet  3  . anastrozole (ARIMIDEX) 1 MG tablet Take 1 tablet (1 mg total) by mouth daily.  30 tablet  4  . aspirin 81 MG tablet Take by mouth daily. 2 tabs by mouth per day      . cholecalciferol (VITAMIN D) 1000 UNITS tablet Take 1,000 Units by mouth daily.      . furosemide (LASIX) 40 MG tablet Take 40 mg by mouth daily.      Marland Kitchen lisinopril (PRINIVIL,ZESTRIL) 40 MG tablet Take 40 mg by mouth daily.      . traMADol-acetaminophen (ULTRACET) 37.5-325 MG per tablet Take 1 tablet by mouth every 6 (six) hours as needed for pain.  120 tablet  2  . azithromycin (ZITHROMAX Z-PAK) 250 MG tablet Use as directed  6 each  1   No current facility-administered medications for this visit.    OBJECTIVE: Filed Vitals:   07/31/12 1458  BP: 175/88  Pulse: 89  Temp: 97.6 F (36.4 C)  Resp: 20     Body mass index is 40.14 kg/(m^2).    ECOG FS: Symptomatic but completely ambulatory  General: Well developed, well nourished female in no acute distress. Alert and oriented x 3.  Using walker.  Head/ Neck: Oropharynx clear.  Sclerae anicteric.  Supple without any enlargements.  Lymph node survey: No cervical, supraclavicular, or axillary adenopathy  Cardiovascular: Regular rate and rhythm. S1 and S2 normal.  Lungs: Clear to auscultation bilaterally. No wheezes/crackles/rhonchi noted.  Skin: No  rashes or lesions present. Back: No CVA tenderness.  Abdomen: Abdomen soft, non-tender and obese. Active bowel sounds in all quadrants. No evidence of a fluid wave, organomegaly, or abdominal masses.  Breasts:  Inspection negative with no nodularity, masses, erythema, or discharge noted bilaterally.  Right lumpectomy incision well healed. Extremities: No bilateral cyanosis or clubbing. Mild, non-pitting edema noted in BLE.   LAB RESULTS: Lab Results  Component Value Date   WBC 6.2 04/17/2012   NEUTROABS 4.2 04/17/2012   HGB 14.0 04/17/2012   HCT 43.9 04/17/2012   MCV 90.6 04/17/2012   PLT 178.0 04/17/2012      Chemistry      Component Value Date/Time   NA 140 04/17/2012 1451   K 4.0 04/17/2012 1451   CL 100 04/17/2012 1451   CO2 32 04/17/2012 1451   BUN 34* 04/17/2012 1451   CREATININE 1.3* 04/17/2012 1451      Component Value Date/Time   CALCIUM 9.4 04/17/2012 1451   ALKPHOS 90 04/17/2012 1451   AST 44* 04/17/2012 1451   ALT 29 04/17/2012 1451   BILITOT 0.8 04/17/2012 1451       Lab Results  Component Value Date   LABCA2 20 08/01/2011    No components found with this basename: [id number]    No results found for this basename: INR,  in the last 168 hours  Urinalysis    Component Value Date/Time   COLORURINE YELLOW 12/11/2010 1716   APPEARANCEUR CLOUDY* 12/11/2010 1716   LABSPEC 1.016 12/11/2010 [place] 5.0 12/11/2010 [place] 12/11/2010 [place] 12/11/2010 [place] 12/11/2010 1716  KETONESUR NEGATIVE 12/11/2010 1716   PROTEINUR NEGATIVE 12/11/2010 1716   UROBILINOGEN 0.2 12/11/2010 1716   NITRITE NEGATIVE 12/11/2010 [place] 12/11/2010 1716    STUDIES: Mm Digital Diagnostic Bilat  07/02/2012  *RADIOLOGY REPORT*  Clinical Data:  The patient underwent right lumpectomy and radiation therapy for breast cancer in 2013.  DIGITAL DIAGNOSTIC BILATERAL MAMMOGRAM WITH CAD  Comparison: 05/23/2011, 03/15/2010, 02/26/2009,  02/25/2008  Findings:  ACR Breast Density Category 1: The breast tissue is almost entirely fatty.  Right lumpectomy and radiation therapy changes are present.  There is no suspicious dominant mass, nonsurgical architectural distortion or calcification to suggest malignancy.  Mammographic images were processed with CAD.  IMPRESSION: No mammographic evidence of malignancy.  RECOMMENDATION: Yearly diagnostic mammography is suggested.  I have discussed the findings and recommendations with the patient. Results were also provided in writing at the conclusion of the visit.  BI-RADS CATEGORY 2:  Benign finding(s).   Original Report Authenticated By: Ulyess Blossom, M.D.     ASSESSMENT: 79 y.o. [place] woman: #1 S/p right lumpectomy and sentinel lymph node biopsy on 06/29/11 by Dr. Hassell Done for T2 N0 IDC Grade I, Stage IIA, ER+, PR+, Ki67 9%, HER2 -.  #2 She completed radiation therapy from 02/25/12 to 04/02/12.  #3 She has been on Arimidex 1 mg daily since 04/2012.  PLAN:  She is to follow up in 6 months with Dr. Jana Hakim with lab work at that time.  She is instructed to continue Arimidex as prescribed. Red yeast rice over the counter to lower her cholesterol is recommended by Dr. Jana Hakim.  Reportable signs and symptoms reviewed.  She is instructed to call for any questions or concerns.  The patient was reviewed with Dr. Jana Hakim, who spoke with the patient about future plans and recommendations.      [name]    07/31/2012

## 2012-08-17 ENCOUNTER — Other Ambulatory Visit: Payer: Self-pay | Admitting: Internal Medicine

## 2012-10-01 ENCOUNTER — Other Ambulatory Visit: Payer: Self-pay | Admitting: Internal Medicine

## 2012-10-16 ENCOUNTER — Ambulatory Visit: Payer: Medicare Other | Admitting: Internal Medicine

## 2012-10-31 ENCOUNTER — Ambulatory Visit (INDEPENDENT_AMBULATORY_CARE_PROVIDER_SITE_OTHER): Payer: Medicare Other | Admitting: Surgery

## 2012-10-31 ENCOUNTER — Encounter (INDEPENDENT_AMBULATORY_CARE_PROVIDER_SITE_OTHER): Payer: Self-pay | Admitting: Surgery

## 2012-10-31 VITALS — BP 140/81 | HR 76 | Temp 98.3°F | Resp 14 | Ht 66.0 in | Wt 246.0 lb

## 2012-10-31 DIAGNOSIS — C50911 Malignant neoplasm of unspecified site of right female breast: Secondary | ICD-10-CM

## 2012-10-31 DIAGNOSIS — C50919 Malignant neoplasm of unspecified site of unspecified female breast: Secondary | ICD-10-CM

## 2012-10-31 NOTE — Progress Notes (Signed)
Early A Orf 79 y.o.  Body mass index is 39.72 kg/(m^2).  Patient Active Problem List   Diagnosis Date Noted  . Epistaxis 06/27/2012  . Sinus pain 06/27/2012  . Wound healing, delayed 01/26/2012  . Diverticulosis of colon with hemorrhage 12/19/2011  . Acute posthemorrhagic anemia 12/19/2011  . Cancer of upper-outer quadrant of female breast 08/01/2011  . Breast cancer, right breast-lobular s/p lumpectomy and sentinel node biopsy 06/30/2011  . Shingles 02/21/2011  . Preventative health care 02/13/2011  . Bile duct calculus with nonacute cholecystitis 01/21/2011  . Morbid obesity 01/21/2011  . TREMOR 08/17/2010  . DIABETES MELLITUS, TYPE II 01/09/2008  . PERIPHERAL NEUROPATHY 10/17/2007  . FATIGUE 10/17/2007  . COLONIC POLYPS, HX OF 02/12/2007  . POLYP, COLON 02/10/2007  . HYPERLIPIDEMIA 02/10/2007  . GOUT 02/10/2007  . ANXIETY 02/10/2007  . HYPERTENSION 02/10/2007  . CVA 02/10/2007  . DIVERTICULOSIS, COLON 02/10/2007  . IRRITABLE BOWEL, PREDOMINANTLY CONSTIPATION 02/10/2007    Allergies  Allergen Reactions  . Ace Inhibitors Other (See Comments)    Tolerates lisinopril at home. Pt does not recall allergy.  . Atorvastatin     REACTION: myalgias  . Oxycodone-Acetaminophen     REACTION: confusion, fatigue  . Rosuvastatin     REACTION: leg weakness  . Simvastatin     REACTION: leg weakness  . Codeine Nausea And Vomiting and Rash  . Sulfonamide Derivatives Hives and Rash    Past Surgical History  Procedure Laterality Date  . Abdominal hysterectomy    . Bile duct stent placement  ~ 01/2011  . Pelvic bone tumor removal      twice in 2000's  . Colonic perforation repair  ~ 2000  . Total hip arthroplasty  1980's    right  . Joint replacement    . Colon surgery      hole in intestine ,tumors?  . Tonsillectomy  ~ 1946  . Cholecystectomy  03/23/11    lap chole   . Replacement total knee bilateral  ~ 2008; ~ 2010    right; left  . Cataract extraction w/ intraocular  lens  implant, bilateral  1990's  . Hemorrhoid surgery  2012  . Breast lumpectomy  06/29/11    right  . Breast lumpectomy  06/29/2011    Procedure: BREAST LUMPECTOMY WITH EXCISION OF SENTINEL NODE;  Surgeon: Pedro Earls, MD;  Location: [place];  Service: General;  Laterality: Right;  right sentinel node mapping,right sentinel node biopsy, needle localization right breast lumpectomy  . Incision and drainage breast abscess      right breast seroma   [name], MD No diagnosis found.  Mammogram in January was negative for suspicious areas.  No reported nipple discharges or lumps.  Palpation does not reveal any palpable masses.  No evidence of recurrent cancer.  Will see back in 6 months.   [name], MD, [facility] [phone] beeper [phone]  10/31/2012 2:21 PM

## 2012-11-10 ENCOUNTER — Other Ambulatory Visit: Payer: Self-pay | Admitting: Internal Medicine

## 2012-12-26 ENCOUNTER — Other Ambulatory Visit: Payer: Self-pay | Admitting: Internal Medicine

## 2013-01-29 ENCOUNTER — Telehealth: Payer: Self-pay | Admitting: Oncology

## 2013-01-29 ENCOUNTER — Ambulatory Visit (HOSPITAL_BASED_OUTPATIENT_CLINIC_OR_DEPARTMENT_OTHER): Payer: Medicare Other | Admitting: Oncology

## 2013-01-29 ENCOUNTER — Other Ambulatory Visit (HOSPITAL_BASED_OUTPATIENT_CLINIC_OR_DEPARTMENT_OTHER): Payer: Medicare Other | Admitting: Lab

## 2013-01-29 VITALS — BP 171/77 | HR 85 | Temp 98.1°F | Resp 18 | Ht 66.0 in | Wt 257.4 lb

## 2013-01-29 DIAGNOSIS — C50419 Malignant neoplasm of upper-outer quadrant of unspecified female breast: Secondary | ICD-10-CM

## 2013-01-29 DIAGNOSIS — C50919 Malignant neoplasm of unspecified site of unspecified female breast: Secondary | ICD-10-CM

## 2013-01-29 LAB — CBC WITH DIFFERENTIAL/PLATELET
BASO%: 0.9 % (ref 0.0–2.0)
Basophils Absolute: 0.1 10*3/uL (ref 0.0–0.1)
EOS%: 3.4 % (ref 0.0–7.0)
Eosinophils Absolute: 0.2 10*3/uL (ref 0.0–0.5)
HCT: 41.7 % (ref 34.8–46.6)
HGB: 13.5 g/dL (ref 11.6–15.9)
LYMPH%: 20.4 % (ref 14.0–49.7)
MCH: 29.7 pg (ref 25.1–34.0)
MCHC: 32.4 g/dL (ref 31.5–36.0)
MCV: 91.6 fL (ref 79.5–101.0)
MONO#: 0.7 10*3/uL (ref 0.1–0.9)
MONO%: 9.8 % (ref 0.0–14.0)
NEUT#: 4.5 10*3/uL (ref 1.5–6.5)
NEUT%: 65.5 % (ref 38.4–76.8)
Platelets: 165 10*3/uL (ref 145–400)
RBC: 4.55 10*6/uL (ref 3.70–5.45)
RDW: 16.8 % — ABNORMAL HIGH (ref 11.2–14.5)
WBC: 6.9 10*3/uL (ref 3.9–10.3)
lymph#: 1.4 10*3/uL (ref 0.9–3.3)

## 2013-01-29 LAB — COMPREHENSIVE METABOLIC PANEL (CC13)
ALT: 37 U/L (ref 0–55)
AST: 49 U/L — ABNORMAL HIGH (ref 5–34)
Albumin: 3.1 g/dL — ABNORMAL LOW (ref 3.5–5.0)
Alkaline Phosphatase: 104 U/L (ref 40–150)
BUN: 32.9 mg/dL — ABNORMAL HIGH (ref 7.0–26.0)
CO2: 27 mEq/L (ref 22–29)
Calcium: 9.3 mg/dL (ref 8.4–10.4)
Chloride: 107 mEq/L (ref 98–109)
Creatinine: 1.2 mg/dL — ABNORMAL HIGH (ref 0.6–1.1)
Glucose: 166 mg/dl — ABNORMAL HIGH (ref 70–140)
Potassium: 4.1 mEq/L (ref 3.5–5.1)
Sodium: 145 mEq/L (ref 136–145)
Total Bilirubin: 0.68 mg/dL (ref 0.20–1.20)
Total Protein: 7.1 g/dL (ref 6.4–8.3)

## 2013-01-29 NOTE — Telephone Encounter (Signed)
, °

## 2013-01-29 NOTE — Progress Notes (Signed)
ID: [name]   DOB: [birth year]  MR#: [id number]  [id number]  PCP: [name], MD GYN:  None  SU: Dr. Rockne Coons   HISTORY OF PRESENT ILLNESS:  [name] is a 79 year old [place] woman who underwent screening mammography in December of 2012 and a follow up right mammogram was recommended.  The mammogram and ultrasound demonstrated a area of abnormality 7 cm from the nipple measuring 1.5 x 1.3 x 1.1 cm with ultrasound of the axilla negative.  A biopsy of the mass performed on 06/09/2011 showed invasive ductal carcinoma, grade I-II,  ER + 100%, PR +100%, Ki-67 9%, HER-2 negative with ratio of 1.20.  MRI scans of the breasts on 06/17/2011 showed a mass measuring 2.8 x 1.7 x 1.3 cm with no other abnormalities seen. The patient underwent a right lumpectomy with sentinel lymph node evaluation on 06/29/2011 by Dr. Hassell Done with final pathology showing a 2.8 cm, grade 1 invasive ductal carcinoma with no evidence of angiolymphatic invasion with closest surgical margins of 0.2cm. One sentinel lymph node identified was negative for malignancy.  The patient's postoperative course was complicated by a hematoma and she completed a course of antibiotics.  She completed radiation under the care of Dr. Valere Dross from 02/15/12 to 04/02/12.  She was started on Arimidex 1 mg daily in 04/2012.  INTERVAL HISTORY:  [name] returns today for followup of her breast cancer. The interval history is generally stable. 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 "It's easier than cleaning house". She is tolerating the anastrozole with no hot flashes or other side effects that she is aware of.  REVIEW OF SYSTEMS: Her biggest problem is arthritis. She does have aches and pains pretty much everywhere. These did not, on with her anastrozole and did not get worse 20 started anastrozole. She does not like to take pain medicine for this but she has some if she needs it.  She complains about hearing loss, dentures not fitting well, and particularly nocturia and urinary urgency. She has seen urology for this without resolution. Otherwise a detailed review of systems today was noncontributory  PAST MEDICAL HISTORY: Past Medical History  Diagnosis Date  . Hypertension   . Hyperlipidemia   . Morbid obesity   . Osteoarthritis   . Gout   . CVA (cerebral infarction)   . IBS (irritable bowel syndrome)   . Diverticulosis   . Esophageal stricture   . Colon polyps     hyperplastic  . Anxiety   . Hiatal hernia     4 cm  . Shingles 02/21/2011  . Skin cancer   . Breast cancer     right breast  . Dysrhythmia     dr bensimhon   . Blood transfusion   . Chronic kidney disease     occ uti's  . GERD (gastroesophageal reflux disease)   . PONV (postoperative nausea and vomiting)   . DVT of leg (deep venous thrombosis)     left; S/P OR  . Atrial flutter     "sometimes"  . Bronchitis   . Pneumonia     "walking"  . Shortness of breath on exertion   . Anemia   . Stroke 2002    residual:  "little tingling in palm of left hand"  . Kidney stones 1990's  . UTI (lower urinary tract infection)     "I've had a few"  .  History of radiation therapy 02/15/12-04/02/12    right breast 4680 cGy/26 sessions, right boost=1400cGy /7 sessions    PAST SURGICAL HISTORY: Past Surgical History  Procedure Laterality Date  . Abdominal hysterectomy    . Bile duct stent placement  ~ 01/2011  . Pelvic bone tumor removal      twice in 2000's  . Colonic perforation repair  ~ 2000  . Total hip arthroplasty  1980's    right  . Joint replacement    . Colon surgery      hole in intestine ,tumors?  . Tonsillectomy  ~ 1946  . Cholecystectomy  03/23/11    lap chole   . Replacement total knee bilateral  ~ 2008; ~ 2010    right; left  . Cataract extraction w/ intraocular lens  implant, bilateral  1990's  . Hemorrhoid surgery  2012  . Breast lumpectomy  06/29/11    right  . Breast  lumpectomy  06/29/2011    Procedure: BREAST LUMPECTOMY WITH EXCISION OF SENTINEL NODE;  Surgeon: Pedro Earls, MD;  Location: [place];  Service: General;  Laterality: Right;  right sentinel node mapping,right sentinel node biopsy, needle localization right breast lumpectomy  . Incision and drainage breast abscess      right breast seroma    FAMILY HISTORY Family History  Problem Relation Age of Onset  . Breast cancer Sister     x 2  . Diabetes Sister   . Prostate cancer Brother   . Colon cancer Neg Hx   . Stroke Mother   . Kidney failure Sister   . Heart disease Paternal Uncle     multiple  . Heart disease Maternal Uncle     multiple    GYNECOLOGIC HISTORY:  G 5 P 2, menarche-15, menopause at time of hysterectomy, no long term use of HRT  SOCIAL HISTORY:  She lives at home with her husband and has been married for over 39 years.  She has two sons: one who lives in [place] and one who lives 2 miles away from her.  She continues to cut hair, with a maximum of 5 people a day.  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    ADVANCED DIRECTIVES:  Living will.  HEALTH MAINTENANCE: History  Substance Use Topics  . Smoking status: Never Smoker   . Smokeless tobacco: Never Used  . Alcohol Use: No     Colonoscopy:  Last year due to rectal bleeding, which was related to hemorrhoids  PAP:  Has had hysterectomy  Bone density:  08/2011 resulting normal  Lipid panel:  04/2012 with total cholesterol of 219, trig 276, HDL 31.30, LDL 55.2  Allergies  Allergen Reactions  . Ace Inhibitors Other (See Comments)    Tolerates lisinopril at home. Pt does not recall allergy.  . Atorvastatin     REACTION: myalgias  . Oxycodone-Acetaminophen     REACTION: confusion, fatigue  . Rosuvastatin     REACTION: leg weakness  . Simvastatin     REACTION: leg weakness  . Codeine Nausea And Vomiting and Rash  . Sulfonamide Derivatives Hives and Rash    Current Outpatient Prescriptions  Medication Sig  Dispense Refill  . allopurinol (ZYLOPRIM) 100 MG tablet Take 100-200 mg by mouth every other day. Takes 1 tablet every other day then takes 2 tablets every other day alternating      . allopurinol (ZYLOPRIM) 100 MG tablet TAKE TWO TABLETS BY MOUTH EVERY OTHER DAY  135 tablet  0  .  amLODipine (NORVASC) 5 MG tablet Take 1 tablet (5 mg total) by mouth daily.  90 tablet  3  . anastrozole (ARIMIDEX) 1 MG tablet Take 1 tablet (1 mg total) by mouth daily.  30 tablet  4  . aspirin 81 MG tablet Take by mouth daily. 2 tabs by mouth per day      . azithromycin (ZITHROMAX Z-PAK) 250 MG tablet Use as directed  6 each  1  . cholecalciferol (VITAMIN D) 1000 UNITS tablet Take 1,000 Units by mouth daily.      . furosemide (LASIX) 40 MG tablet TAKE ONE TABLET BY MOUTH EVERY DAY  90 tablet  2  . lisinopril (PRINIVIL,ZESTRIL) 20 MG tablet TAKE TWO TABLETS BY MOUTH EVERY DAY  180 tablet  2  . lisinopril (PRINIVIL,ZESTRIL) 40 MG tablet Take 40 mg by mouth daily.      . traMADol-acetaminophen (ULTRACET) 37.5-325 MG per tablet Take 1 tablet by mouth every 6 (six) hours as needed for pain.  120 tablet  2   No current facility-administered medications for this visit.    OBJECTIVE: Elderly white woman who appears stated age 23 Vitals:   01/29/13 1430  BP: 171/77  Pulse: 85  Temp: 98.1 F (36.7 C)  Resp: 18     Body mass index is 41.56 kg/(m^2).    ECOG FS: 1 Sclerae unicteric Oropharynx clear No cervical or supraclavicular adenopathy Lungs no rales or rhonchi, fair excursion bilaterally Heart regular rate and rhythm, no murmur appreciated Abd obese, benign MSK kyphosis and scoliosis but no focal spinal tenderness Neuro: nonfocal, well oriented, pleasant affect Breasts: The right breast is status post lumpectomy and radiation. There is no evidence of local recurrence. The right axilla is benign. The left breast is unremarkable.   LAB RESULTS: Lab Results  Component Value Date   WBC 6.9 01/29/2013    NEUTROABS 4.5 01/29/2013   HGB 13.5 01/29/2013   HCT 41.7 01/29/2013   MCV 91.6 01/29/2013   PLT 165 01/29/2013      Chemistry      Component Value Date/Time   NA 145 01/29/2013 1411   NA 140 04/17/2012 1451   K 4.1 01/29/2013 1411   K 4.0 04/17/2012 1451   CL 100 04/17/2012 1451   CO2 27 01/29/2013 1411   CO2 32 04/17/2012 1451   BUN 32.9* 01/29/2013 1411   BUN 34* 04/17/2012 1451   CREATININE 1.2* 01/29/2013 1411   CREATININE 1.3* 04/17/2012 1451      Component Value Date/Time   CALCIUM 9.3 01/29/2013 1411   CALCIUM 9.4 04/17/2012 1451   ALKPHOS 104 01/29/2013 1411   ALKPHOS 90 04/17/2012 1451   AST 49* 01/29/2013 1411   AST 44* 04/17/2012 1451   ALT 37 01/29/2013 1411   ALT 29 04/17/2012 1451   BILITOT 0.68 01/29/2013 1411   BILITOT 0.8 04/17/2012 1451       Lab Results  Component Value Date   LABCA2 20 08/01/2011    No components found with this basename: [id number]    No results found for this basename: INR,  in the last 168 hours  Urinalysis    Component Value Date/Time   COLORURINE YELLOW 12/11/2010 1716   APPEARANCEUR CLOUDY* 12/11/2010 1716   LABSPEC 1.016 12/11/2010 1716   PHURINE 5.0 12/11/2010 [place] 12/11/2010 [place] 12/11/2010 [place] 12/11/2010 [place] 12/11/2010 [place] 12/11/2010 1716   UROBILINOGEN 0.2  12/11/2010 1716   NITRITE NEGATIVE 12/11/2010 [place] 12/11/2010 1716    STUDIES: Mm Digital Diagnostic Bilat  07/02/2012  *RADIOLOGY REPORT*  Clinical Data:  The patient underwent right lumpectomy and radiation therapy for breast cancer in 2013.  DIGITAL DIAGNOSTIC BILATERAL MAMMOGRAM WITH CAD  Comparison: 05/23/2011, 03/15/2010, 02/26/2009, 02/25/2008  Findings:  ACR Breast Density Category 1: The breast tissue is almost entirely fatty.  Right lumpectomy and radiation therapy changes are present.  There is no suspicious dominant mass, nonsurgical architectural  distortion or calcification to suggest malignancy.  Mammographic images were processed with CAD.  IMPRESSION: No mammographic evidence of malignancy.  RECOMMENDATION: Yearly diagnostic mammography is suggested.  I have discussed the findings and recommendations with the patient. Results were also provided in writing at the conclusion of the visit.  BI-RADS CATEGORY 2:  Benign finding(s).   Original Report Authenticated By: Ulyess Blossom, M.D.     ASSESSMENT: 79 y.o. [place] woman:  #1 S/p right lumpectomy and sentinel lymph node biopsy on 06/29/11 for a pT2 pN0, stage IIA IDC Grade I, ER+, PR+, Ki67 9%, HER2 -.  #2 She completed radiation therapy from 02/25/12 to 04/02/12.  #3 She has been on Arimidex 1 mg daily since 04/2012. She had a normal bone density March of 2013  PLAN:  Ms Mcgann is doing fine from a breast cancer point of view, and appears to be tolerating the anastrozole without any unusual complications. She sees Dr. Hassell Done twice a year, and of course she sees her other doctors as well, so we are going to start seeing her on a once a year basis until she completes her 5 years of antiestrogen therapy. She knows to call for any problems that may develop before her next visit here.  Mckyle Solanki C    01/29/2013

## 2013-01-30 NOTE — Addendum Note (Signed)
Addended by: Jaci Carrel A on: 01/30/2013 10:23 AM   Modules accepted: Orders

## 2013-03-25 ENCOUNTER — Other Ambulatory Visit: Payer: Self-pay | Admitting: Internal Medicine

## 2013-04-15 ENCOUNTER — Other Ambulatory Visit: Payer: Self-pay | Admitting: Oncology

## 2013-04-15 DIAGNOSIS — C50419 Malignant neoplasm of upper-outer quadrant of unspecified female breast: Secondary | ICD-10-CM

## 2013-04-30 ENCOUNTER — Encounter (INDEPENDENT_AMBULATORY_CARE_PROVIDER_SITE_OTHER): Payer: Self-pay | Admitting: Surgery

## 2013-04-30 ENCOUNTER — Ambulatory Visit (INDEPENDENT_AMBULATORY_CARE_PROVIDER_SITE_OTHER): Payer: Medicare Other | Admitting: Surgery

## 2013-04-30 VITALS — BP 136/80 | HR 78 | Temp 98.0°F | Resp 18 | Ht 66.0 in | Wt 244.0 lb

## 2013-04-30 DIAGNOSIS — C50919 Malignant neoplasm of unspecified site of unspecified female breast: Secondary | ICD-10-CM

## 2013-04-30 DIAGNOSIS — C50911 Malignant neoplasm of unspecified site of right female breast: Secondary | ICD-10-CM

## 2013-04-30 NOTE — Progress Notes (Signed)
[name] 79 y.o.  Body mass index is 39.4 kg/(m^2).  Patient Active Problem List   Diagnosis Date Noted  . Epistaxis 06/27/2012  . Sinus pain 06/27/2012  . Wound healing, delayed 01/26/2012  . Diverticulosis of colon with hemorrhage 12/19/2011  . Acute posthemorrhagic anemia 12/19/2011  . Cancer of upper-outer quadrant of female breast 08/01/2011  . Breast cancer, right breast-lobular s/p lumpectomy and sentinel node biopsy 06/30/2011  . Shingles 02/21/2011  . Preventative health care 02/13/2011  . Bile duct calculus with nonacute cholecystitis 01/21/2011  . Morbid obesity 01/21/2011  . TREMOR 08/17/2010  . DIABETES MELLITUS, TYPE II 01/09/2008  . PERIPHERAL NEUROPATHY 10/17/2007  . FATIGUE 10/17/2007  . COLONIC POLYPS, HX OF 02/12/2007  . POLYP, COLON 02/10/2007  . HYPERLIPIDEMIA 02/10/2007  . GOUT 02/10/2007  . ANXIETY 02/10/2007  . HYPERTENSION 02/10/2007  . CVA 02/10/2007  . DIVERTICULOSIS, COLON 02/10/2007  . IRRITABLE BOWEL, PREDOMINANTLY CONSTIPATION 02/10/2007    Allergies  Allergen Reactions  . Ace Inhibitors Other (See Comments)    Tolerates lisinopril at home. Pt does not recall allergy.  . Atorvastatin     REACTION: myalgias  . Oxycodone-Acetaminophen     REACTION: confusion, fatigue  . Rosuvastatin     REACTION: leg weakness  . Simvastatin     REACTION: leg weakness  . Codeine Nausea And Vomiting and Rash  . Sulfonamide Derivatives Hives and Rash    Past Surgical History  Procedure Laterality Date  . Abdominal hysterectomy    . Bile duct stent placement  ~ 01/2011  . Pelvic bone tumor removal      twice in 2000's  . Colonic perforation repair  ~ 2000  . Total hip arthroplasty  1980's    right  . Joint replacement    . Colon surgery      hole in intestine ,tumors?  . Tonsillectomy  ~ 1946  . Cholecystectomy  03/23/11    lap chole   . Replacement total knee bilateral  ~ 2008; ~ 2010    right; left  . Cataract extraction w/ intraocular  lens  implant, bilateral  1990's  . Hemorrhoid surgery  2012  . Breast lumpectomy  06/29/11    right  . Breast lumpectomy  06/29/2011    Procedure: BREAST LUMPECTOMY WITH EXCISION OF SENTINEL NODE;  Surgeon: Pedro Earls, MD;  Location: [place];  Service: General;  Laterality: Right;  right sentinel node mapping,right sentinel node biopsy, needle localization right breast lumpectomy  . Incision and drainage breast abscess      right breast seroma   [name], MD No diagnosis found.  Followup Z. At which time her main complaint is some right axillary discomfort. On examination there are no palpable masses or evidence of recurrence in the right axilla. Her incisions have healed nicely. She has large volume loss from our I took out a large lump in that she had radiation therapy. She is currently taking Arimidex daily.  He seems to be giving along very well. I do not feel anything in the left breast is in any way worrisome. I will plan to see her again in 6 months. [name], MD, [facility] [phone] beeper [phone]  04/30/2013 4:20 PM

## 2013-04-30 NOTE — Patient Instructions (Signed)
Thanks for your patience.  If you need further assistance after leaving the office, please call our office and speak with a [facility] nurse.  [phone].  If you want to leave a message for Dr. Rameen Gohlke, please call his office phone at [phone]. 

## 2013-05-13 ENCOUNTER — Other Ambulatory Visit: Payer: Self-pay | Admitting: Internal Medicine

## 2013-06-07 ENCOUNTER — Ambulatory Visit (INDEPENDENT_AMBULATORY_CARE_PROVIDER_SITE_OTHER): Payer: Medicare Other | Admitting: Internal Medicine

## 2013-06-07 ENCOUNTER — Encounter: Payer: Self-pay | Admitting: Internal Medicine

## 2013-06-07 ENCOUNTER — Ambulatory Visit (INDEPENDENT_AMBULATORY_CARE_PROVIDER_SITE_OTHER)
Admission: RE | Admit: 2013-06-07 | Discharge: 2013-06-07 | Disposition: A | Discharge status: Home / Self Care | Payer: Medicare Other | Source: Ambulatory Visit | Attending: Internal Medicine | Admitting: Internal Medicine

## 2013-06-07 VITALS — BP 130/62 | HR 80 | Temp 99.6°F | Ht 65.0 in | Wt 251.0 lb

## 2013-06-07 DIAGNOSIS — I10 Essential (primary) hypertension: Secondary | ICD-10-CM

## 2013-06-07 DIAGNOSIS — J209 Acute bronchitis, unspecified: Secondary | ICD-10-CM

## 2013-06-07 DIAGNOSIS — R062 Wheezing: Secondary | ICD-10-CM | POA: Insufficient documentation

## 2013-06-07 DIAGNOSIS — E119 Type 2 diabetes mellitus without complications: Secondary | ICD-10-CM

## 2013-06-07 MED ORDER — METHYLPREDNISOLONE ACETATE 80 MG/ML IJ SUSP
80.0000 mg | Freq: Once | INTRAMUSCULAR | Status: AC
  Administered 2013-06-07: 80 mg via INTRAMUSCULAR

## 2013-06-07 MED ORDER — PREDNISONE 10 MG PO TABS: ORAL_TABLET | ORAL | Status: DC

## 2013-06-07 MED ORDER — HYDROCODONE-HOMATROPINE 5-1.5 MG/5ML PO SYRP: 5.0000 mL | ORAL_SOLUTION | Freq: Four times a day (QID) | ORAL | Status: DC | PRN

## 2013-06-07 MED ORDER — ASPIRIN 325 MG PO TBEC: 325.0000 mg | DELAYED_RELEASE_TABLET | Freq: Every day | ORAL | Status: DC

## 2013-06-07 MED ORDER — LEVOFLOXACIN 250 MG PO TABS: 250.0000 mg | ORAL_TABLET | Freq: Every day | ORAL | Status: DC

## 2013-06-07 NOTE — Patient Instructions (Signed)
You had the steroid shot today Please take all new medication as prescribed - the antibiotic, cough medicine, and prednisone for the wheezing Please continue all other medications as before, and refills have been done if requested. Please have the pharmacy call with any other refills you may need.  Please go to the XRAY Department in the Basement (go straight as you get off the elevator) for the x-ray testing  You will be contacted by phone if any changes need to be made immediately.  Otherwise, you will receive a letter about your results with an explanation, but please check with MyChart first.  Please remember to sign up for My Chart if you have not done so, as this will be important to you in the future with finding out test results, communicating by private email, and scheduling acute appointments online when needed.

## 2013-06-07 NOTE — Assessment & Plan Note (Signed)
Mild to mod, for antibx course,  to f/u any worsening symptoms or concerns 

## 2013-06-07 NOTE — Progress Notes (Signed)
Subjective:    Patient ID: [name], female    DOB: 03-Mar-1930, 79 y.o.   MRN: [id number]  HPI  Here with acute onset mild to mod 2-3 days ST, HA, general weakness and malaise, with prod cough greenish sputum, but Pt denies chest pain, increased sob or doe, wheezing, orthopnea, PND, increased LE swelling, palpitations, dizziness or syncope, except for onset wheezing  Last PM with mild sob. Has overall body aches.  Pt denies new neurological symptoms such as new headache, or facial or extremity weakness or numbness   Pt denies polydipsia, polyuria,  Past Medical History  Diagnosis Date  . Hypertension   . Hyperlipidemia   . Morbid obesity   . Osteoarthritis   . Gout   . CVA (cerebral infarction)   . IBS (irritable bowel syndrome)   . Diverticulosis   . Esophageal stricture   . Colon polyps     hyperplastic  . Anxiety   . Hiatal hernia     4 cm  . Shingles 02/21/2011  . Skin cancer   . Breast cancer     right breast  . Dysrhythmia     dr bensimhon   . Blood transfusion   . Chronic kidney disease     occ uti's  . GERD (gastroesophageal reflux disease)   . PONV (postoperative nausea and vomiting)   . DVT of leg (deep venous thrombosis)     left; S/P OR  . Atrial flutter     "sometimes"  . Bronchitis   . Pneumonia     "walking"  . Shortness of breath on exertion   . Anemia   . Stroke 2002    residual:  "little tingling in palm of left hand"  . Kidney stones 1990's  . UTI (lower urinary tract infection)     "I've had a few"  . History of radiation therapy 02/15/12-04/02/12    right breast 4680 cGy/26 sessions, right boost=1400cGy /7 sessions   Past Surgical History  Procedure Laterality Date  . Abdominal hysterectomy    . Bile duct stent placement  ~ 01/2011  . Pelvic bone tumor removal      twice in 2000's  . Colonic perforation repair  ~ 2000  . Total hip arthroplasty  1980's    right  . Joint replacement    . Colon surgery      hole in intestine ,tumors?    . Tonsillectomy  ~ 1946  . Cholecystectomy  03/23/11    lap chole   . Replacement total knee bilateral  ~ 2008; ~ 2010    right; left  . Cataract extraction w/ intraocular lens  implant, bilateral  1990's  . Hemorrhoid surgery  2012  . Breast lumpectomy  06/29/11    right  . Breast lumpectomy  06/29/2011    Procedure: BREAST LUMPECTOMY WITH EXCISION OF SENTINEL NODE;  Surgeon: Pedro Earls, MD;  Location: [place];  Service: General;  Laterality: Right;  right sentinel node mapping,right sentinel node biopsy, needle localization right breast lumpectomy  . Incision and drainage breast abscess      right breast seroma    reports that she has never smoked. She has never used smokeless tobacco. She reports that she does not drink alcohol or use illicit drugs. family history includes Breast cancer in her sister; Diabetes in her sister; Heart disease in her maternal uncle and paternal uncle; Kidney failure in her sister; Prostate cancer in her brother; Stroke in her mother. There  is no history of Colon cancer. Allergies  Allergen Reactions  . Ace Inhibitors Other (See Comments)    Tolerates lisinopril at home. Pt does not recall allergy.  . Atorvastatin     REACTION: myalgias  . Oxycodone-Acetaminophen     REACTION: confusion, fatigue  . Rosuvastatin     REACTION: leg weakness  . Simvastatin     REACTION: leg weakness  . Codeine Nausea And Vomiting and Rash  . Sulfonamide Derivatives Hives and Rash   Current Outpatient Prescriptions on File Prior to Visit  Medication Sig Dispense Refill  . allopurinol (ZYLOPRIM) 100 MG tablet TAKE TWO TABLETS BY MOUTH EVERY OTHER DAY  135 tablet  0  . amLODipine (NORVASC) 5 MG tablet Take 1 tablet (5 mg total) by mouth daily.  90 tablet  3  . anastrozole (ARIMIDEX) 1 MG tablet TAKE 1 TABLET BY MOUTH ONCE DAILY  90 tablet  3  . cholecalciferol (VITAMIN D) 1000 UNITS tablet Take 1,000 Units by mouth daily.      . furosemide (LASIX) 40 MG tablet TAKE ONE  TABLET BY MOUTH ONCE DAILY  90 tablet  1  . lisinopril (PRINIVIL,ZESTRIL) 40 MG tablet Take 40 mg by mouth daily.      . traMADol-acetaminophen (ULTRACET) 37.5-325 MG per tablet Take 1 tablet by mouth every 6 (six) hours as needed for pain.  120 tablet  2   No current facility-administered medications on file prior to visit.   Review of Systems  Constitutional: Negative for unexpected weight change, or unusual diaphoresis  HENT: Negative for tinnitus.   Eyes: Negative for photophobia and visual disturbance.  Respiratory: Negative for choking and stridor.   Gastrointestinal: Negative for vomiting and blood in stool.  Genitourinary: Negative for hematuria and decreased urine volume.  Musculoskeletal: Negative for acute joint swelling Skin: Negative for color change and wound.  Neurological: Negative for tremors and numbness other than noted  Psychiatric/Behavioral: Negative for decreased concentration or  hyperactivity.       Objective:   Physical Exam BP 130/62  Pulse 80  Temp(Src) 99.6 F (37.6 C) (Oral)  Ht 5\' 5"  (1.651 m)  Wt 251 lb (113.853 kg)  BMI 41.77 kg/m2  SpO2 91% VS noted, mild ill appearing, general weakness, declines to get up on exam table Constitutional: Pt appears well-developed and well-nourished./obese  HENT: Head: NCAT.  Right Ear: External ear normal.  Left Ear: External ear normal.  Eyes: Conjunctivae and EOM are normal. Pupils are equal, round, and reactive to light.  Neck: Normal range of motion. Neck supple.  Cardiovascular: Normal rate and regular rhythm.   Pulmonary/Chest: Effort normal and breath sounds decreased with bilat wheezes.  Neurological: Pt is alert. Not confused  Skin: Skin is warm. No erythema.  Psychiatric: Pt behavior is normal. Thought content normal.     Assessment & Plan:

## 2013-06-07 NOTE — Assessment & Plan Note (Signed)
stable overall by history and exam, recent data reviewed with pt, and pt to continue medical treatment as before,  to f/u any worsening symptoms or concerns BP Readings from Last 3 Encounters:  06/07/13 130/62  04/30/13 136/80  01/29/13 171/77

## 2013-06-07 NOTE — Assessment & Plan Note (Signed)
Mild to mod, for depomedrol IM,and predpack asd,  to f/u any worsening symptoms or concerns 

## 2013-06-07 NOTE — Assessment & Plan Note (Signed)
stable overall by history and exam, recent data reviewed with pt, and pt to continue medical treatment as before,  to f/u any worsening symptoms or concerns Lab Results  Component Value Date   HGBA1C 6.5 04/17/2012   To f/u for onset polys or cbg > 200 on steroid tx

## 2013-06-07 NOTE — Progress Notes (Signed)
Pre-visit discussion using our clinic review tool. No additional management support is needed unless otherwise documented below in the visit note.  

## 2013-06-26 ENCOUNTER — Other Ambulatory Visit: Payer: Self-pay | Admitting: Internal Medicine

## 2013-07-10 ENCOUNTER — Other Ambulatory Visit: Payer: Self-pay | Admitting: General Practice

## 2013-07-10 ENCOUNTER — Other Ambulatory Visit (INDEPENDENT_AMBULATORY_CARE_PROVIDER_SITE_OTHER): Payer: Self-pay | Admitting: Surgery

## 2013-07-10 DIAGNOSIS — Z9889 Other specified postprocedural states: Secondary | ICD-10-CM

## 2013-07-10 DIAGNOSIS — Z853 Personal history of malignant neoplasm of breast: Secondary | ICD-10-CM

## 2013-08-05 ENCOUNTER — Other Ambulatory Visit: Payer: Self-pay | Admitting: Internal Medicine

## 2013-08-12 ENCOUNTER — Emergency Department (HOSPITAL_COMMUNITY): Payer: Medicare Other

## 2013-08-12 ENCOUNTER — Encounter (HOSPITAL_COMMUNITY): Payer: Self-pay | Admitting: Emergency Medicine

## 2013-08-12 ENCOUNTER — Encounter (HOSPITAL_COMMUNITY): Payer: Medicare Other | Admitting: Certified Registered Nurse Anesthetist

## 2013-08-12 ENCOUNTER — Ambulatory Visit: Admit: 2013-08-12 | Payer: Self-pay | Admitting: Surgery

## 2013-08-12 ENCOUNTER — Encounter (HOSPITAL_COMMUNITY)
Admission: EM | Disposition: A | Discharge status: Skilled Nursing Facility | Payer: Self-pay | Source: Home / Self Care | Attending: Emergency Medicine

## 2013-08-12 ENCOUNTER — Inpatient Hospital Stay (HOSPITAL_COMMUNITY): Payer: Medicare Other | Admitting: Certified Registered Nurse Anesthetist

## 2013-08-12 ENCOUNTER — Inpatient Hospital Stay (HOSPITAL_COMMUNITY)
Admission: EM | Admit: 2013-08-12 | Discharge: 2013-08-29 | DRG: 853 | Disposition: A | Discharge status: Skilled Nursing Facility | Payer: Medicare Other | Attending: Internal Medicine | Admitting: Internal Medicine

## 2013-08-12 DIAGNOSIS — I959 Hypotension, unspecified: Secondary | ICD-10-CM | POA: Diagnosis present

## 2013-08-12 DIAGNOSIS — R6521 Severe sepsis with septic shock: Secondary | ICD-10-CM

## 2013-08-12 DIAGNOSIS — Z6841 Body Mass Index (BMI) 40.0 and over, adult: Secondary | ICD-10-CM

## 2013-08-12 DIAGNOSIS — R5383 Other fatigue: Secondary | ICD-10-CM

## 2013-08-12 DIAGNOSIS — N183 Chronic kidney disease, stage 3 unspecified: Secondary | ICD-10-CM | POA: Diagnosis present

## 2013-08-12 DIAGNOSIS — R21 Rash and other nonspecific skin eruption: Secondary | ICD-10-CM | POA: Diagnosis not present

## 2013-08-12 DIAGNOSIS — C50919 Malignant neoplasm of unspecified site of unspecified female breast: Secondary | ICD-10-CM | POA: Diagnosis present

## 2013-08-12 DIAGNOSIS — Z823 Family history of stroke: Secondary | ICD-10-CM

## 2013-08-12 DIAGNOSIS — Z803 Family history of malignant neoplasm of breast: Secondary | ICD-10-CM

## 2013-08-12 DIAGNOSIS — I129 Hypertensive chronic kidney disease with stage 1 through stage 4 chronic kidney disease, or unspecified chronic kidney disease: Secondary | ICD-10-CM | POA: Diagnosis present

## 2013-08-12 DIAGNOSIS — R404 Transient alteration of awareness: Secondary | ICD-10-CM | POA: Diagnosis not present

## 2013-08-12 DIAGNOSIS — Z882 Allergy status to sulfonamides status: Secondary | ICD-10-CM

## 2013-08-12 DIAGNOSIS — Z7982 Long term (current) use of aspirin: Secondary | ICD-10-CM

## 2013-08-12 DIAGNOSIS — R6520 Severe sepsis without septic shock: Secondary | ICD-10-CM

## 2013-08-12 DIAGNOSIS — G609 Hereditary and idiopathic neuropathy, unspecified: Secondary | ICD-10-CM

## 2013-08-12 DIAGNOSIS — Z860100 Personal history of colon polyps, unspecified: Secondary | ICD-10-CM

## 2013-08-12 DIAGNOSIS — C50911 Malignant neoplasm of unspecified site of right female breast: Secondary | ICD-10-CM

## 2013-08-12 DIAGNOSIS — Z79899 Other long term (current) drug therapy: Secondary | ICD-10-CM

## 2013-08-12 DIAGNOSIS — Z0000 Encounter for general adult medical examination without abnormal findings: Secondary | ICD-10-CM

## 2013-08-12 DIAGNOSIS — N179 Acute kidney failure, unspecified: Secondary | ICD-10-CM | POA: Diagnosis not present

## 2013-08-12 DIAGNOSIS — K589 Irritable bowel syndrome without diarrhea: Secondary | ICD-10-CM | POA: Diagnosis present

## 2013-08-12 DIAGNOSIS — M726 Necrotizing fasciitis: Secondary | ICD-10-CM | POA: Diagnosis present

## 2013-08-12 DIAGNOSIS — J209 Acute bronchitis, unspecified: Secondary | ICD-10-CM

## 2013-08-12 DIAGNOSIS — I6350 Cerebral infarction due to unspecified occlusion or stenosis of unspecified cerebral artery: Secondary | ICD-10-CM

## 2013-08-12 DIAGNOSIS — M1990 Unspecified osteoarthritis, unspecified site: Secondary | ICD-10-CM | POA: Diagnosis present

## 2013-08-12 DIAGNOSIS — R197 Diarrhea, unspecified: Secondary | ICD-10-CM | POA: Diagnosis not present

## 2013-08-12 DIAGNOSIS — Z8673 Personal history of transient ischemic attack (TIA), and cerebral infarction without residual deficits: Secondary | ICD-10-CM

## 2013-08-12 DIAGNOSIS — A419 Sepsis, unspecified organism: Principal | ICD-10-CM | POA: Diagnosis present

## 2013-08-12 DIAGNOSIS — C50419 Malignant neoplasm of upper-outer quadrant of unspecified female breast: Secondary | ICD-10-CM

## 2013-08-12 DIAGNOSIS — M109 Gout, unspecified: Secondary | ICD-10-CM | POA: Diagnosis present

## 2013-08-12 DIAGNOSIS — E876 Hypokalemia: Secondary | ICD-10-CM | POA: Diagnosis not present

## 2013-08-12 DIAGNOSIS — K219 Gastro-esophageal reflux disease without esophagitis: Secondary | ICD-10-CM | POA: Diagnosis present

## 2013-08-12 DIAGNOSIS — K8040 Calculus of bile duct with cholecystitis, unspecified, without obstruction: Secondary | ICD-10-CM

## 2013-08-12 DIAGNOSIS — Z923 Personal history of irradiation: Secondary | ICD-10-CM

## 2013-08-12 DIAGNOSIS — E785 Hyperlipidemia, unspecified: Secondary | ICD-10-CM

## 2013-08-12 DIAGNOSIS — Z96659 Presence of unspecified artificial knee joint: Secondary | ICD-10-CM

## 2013-08-12 DIAGNOSIS — L03317 Cellulitis of buttock: Secondary | ICD-10-CM

## 2013-08-12 DIAGNOSIS — Z888 Allergy status to other drugs, medicaments and biological substances status: Secondary | ICD-10-CM

## 2013-08-12 DIAGNOSIS — R259 Unspecified abnormal involuntary movements: Secondary | ICD-10-CM

## 2013-08-12 DIAGNOSIS — Z8249 Family history of ischemic heart disease and other diseases of the circulatory system: Secondary | ICD-10-CM

## 2013-08-12 DIAGNOSIS — D72829 Elevated white blood cell count, unspecified: Secondary | ICD-10-CM | POA: Diagnosis present

## 2013-08-12 DIAGNOSIS — R040 Epistaxis: Secondary | ICD-10-CM

## 2013-08-12 DIAGNOSIS — Z87442 Personal history of urinary calculi: Secondary | ICD-10-CM

## 2013-08-12 DIAGNOSIS — D638 Anemia in other chronic diseases classified elsewhere: Secondary | ICD-10-CM | POA: Diagnosis present

## 2013-08-12 DIAGNOSIS — T148XXD Other injury of unspecified body region, subsequent encounter: Secondary | ICD-10-CM

## 2013-08-12 DIAGNOSIS — B029 Zoster without complications: Secondary | ICD-10-CM

## 2013-08-12 DIAGNOSIS — K222 Esophageal obstruction: Secondary | ICD-10-CM | POA: Diagnosis present

## 2013-08-12 DIAGNOSIS — Z833 Family history of diabetes mellitus: Secondary | ICD-10-CM

## 2013-08-12 DIAGNOSIS — L0231 Cutaneous abscess of buttock: Secondary | ICD-10-CM | POA: Diagnosis present

## 2013-08-12 DIAGNOSIS — I5033 Acute on chronic diastolic (congestive) heart failure: Secondary | ICD-10-CM | POA: Diagnosis not present

## 2013-08-12 DIAGNOSIS — D126 Benign neoplasm of colon, unspecified: Secondary | ICD-10-CM

## 2013-08-12 DIAGNOSIS — I509 Heart failure, unspecified: Secondary | ICD-10-CM | POA: Diagnosis present

## 2013-08-12 DIAGNOSIS — K5730 Diverticulosis of large intestine without perforation or abscess without bleeding: Secondary | ICD-10-CM | POA: Diagnosis present

## 2013-08-12 DIAGNOSIS — L0390 Cellulitis, unspecified: Secondary | ICD-10-CM

## 2013-08-12 DIAGNOSIS — K5731 Diverticulosis of large intestine without perforation or abscess with bleeding: Secondary | ICD-10-CM

## 2013-08-12 DIAGNOSIS — K449 Diaphragmatic hernia without obstruction or gangrene: Secondary | ICD-10-CM | POA: Diagnosis present

## 2013-08-12 DIAGNOSIS — E119 Type 2 diabetes mellitus without complications: Secondary | ICD-10-CM | POA: Diagnosis present

## 2013-08-12 DIAGNOSIS — I4892 Unspecified atrial flutter: Secondary | ICD-10-CM | POA: Diagnosis present

## 2013-08-12 DIAGNOSIS — I10 Essential (primary) hypertension: Secondary | ICD-10-CM

## 2013-08-12 DIAGNOSIS — J9600 Acute respiratory failure, unspecified whether with hypoxia or hypercapnia: Secondary | ICD-10-CM | POA: Diagnosis not present

## 2013-08-12 DIAGNOSIS — R062 Wheezing: Secondary | ICD-10-CM | POA: Diagnosis present

## 2013-08-12 DIAGNOSIS — R5381 Other malaise: Secondary | ICD-10-CM

## 2013-08-12 DIAGNOSIS — Z86718 Personal history of other venous thrombosis and embolism: Secondary | ICD-10-CM

## 2013-08-12 DIAGNOSIS — Z96649 Presence of unspecified artificial hip joint: Secondary | ICD-10-CM

## 2013-08-12 DIAGNOSIS — D6489 Other specified anemias: Secondary | ICD-10-CM | POA: Diagnosis present

## 2013-08-12 DIAGNOSIS — N170 Acute kidney failure with tubular necrosis: Secondary | ICD-10-CM | POA: Diagnosis present

## 2013-08-12 DIAGNOSIS — L0291 Cutaneous abscess, unspecified: Secondary | ICD-10-CM

## 2013-08-12 DIAGNOSIS — F411 Generalized anxiety disorder: Secondary | ICD-10-CM | POA: Diagnosis present

## 2013-08-12 DIAGNOSIS — J811 Chronic pulmonary edema: Secondary | ICD-10-CM

## 2013-08-12 DIAGNOSIS — D62 Acute posthemorrhagic anemia: Secondary | ICD-10-CM

## 2013-08-12 DIAGNOSIS — R1310 Dysphagia, unspecified: Secondary | ICD-10-CM | POA: Diagnosis not present

## 2013-08-12 HISTORY — PX: INCISION AND DRAINAGE ABSCESS: SHX5864

## 2013-08-12 LAB — CBC WITH DIFFERENTIAL/PLATELET
Basophils Absolute: 0 10*3/uL (ref 0.0–0.1)
Basophils Relative: 0 % (ref 0–1)
Eosinophils Absolute: 0 10*3/uL (ref 0.0–0.7)
Eosinophils Relative: 0 % (ref 0–5)
HCT: 39.5 % (ref 36.0–46.0)
Hemoglobin: 12.7 g/dL (ref 12.0–15.0)
Lymphocytes Relative: 6 % — ABNORMAL LOW (ref 12–46)
Lymphs Abs: 1 10*3/uL (ref 0.7–4.0)
MCH: 30.1 pg (ref 26.0–34.0)
MCHC: 32.2 g/dL (ref 30.0–36.0)
MCV: 93.6 fL (ref 78.0–100.0)
Monocytes Absolute: 1.8 10*3/uL — ABNORMAL HIGH (ref 0.1–1.0)
Monocytes Relative: 10 % (ref 3–12)
Neutro Abs: 15.1 10*3/uL — ABNORMAL HIGH (ref 1.7–7.7)
Neutrophils Relative %: 84 % — ABNORMAL HIGH (ref 43–77)
Platelets: 187 10*3/uL (ref 150–400)
RBC: 4.22 MIL/uL (ref 3.87–5.11)
RDW: 15.6 % — ABNORMAL HIGH (ref 11.5–15.5)
WBC: 17.9 10*3/uL — ABNORMAL HIGH (ref 4.0–10.5)

## 2013-08-12 LAB — URINE MICROSCOPIC-ADD ON

## 2013-08-12 LAB — I-STAT CHEM 8, ED
BUN: 59 mg/dL — ABNORMAL HIGH (ref 6–23)
Calcium, Ion: 1.16 mmol/L (ref 1.13–1.30)
Chloride: 102 mEq/L (ref 96–112)
Creatinine, Ser: 1.8 mg/dL — ABNORMAL HIGH (ref 0.50–1.10)
Glucose, Bld: 146 mg/dL — ABNORMAL HIGH (ref 70–99)
HCT: 41 % (ref 36.0–46.0)
Hemoglobin: 13.9 g/dL (ref 12.0–15.0)
Potassium: 4.3 mEq/L (ref 3.7–5.3)
Sodium: 139 mEq/L (ref 137–147)
TCO2: 29 mmol/L (ref 0–100)

## 2013-08-12 LAB — GLUCOSE, CAPILLARY
Glucose-Capillary: 112 mg/dL — ABNORMAL HIGH (ref 70–99)
Glucose-Capillary: 158 mg/dL — ABNORMAL HIGH (ref 70–99)

## 2013-08-12 LAB — URINALYSIS, ROUTINE W REFLEX MICROSCOPIC
Glucose, UA: NEGATIVE mg/dL
Ketones, ur: NEGATIVE mg/dL
Leukocytes, UA: NEGATIVE
Nitrite: NEGATIVE
Protein, ur: NEGATIVE mg/dL
Specific Gravity, Urine: 1.019 (ref 1.005–1.030)
Urobilinogen, UA: 1 mg/dL (ref 0.0–1.0)
pH: 5 (ref 5.0–8.0)

## 2013-08-12 LAB — MRSA PCR SCREENING: MRSA by PCR: NEGATIVE

## 2013-08-12 SURGERY — INCISION AND DRAINAGE, ABSCESS
Anesthesia: General | Site: Perineum

## 2013-08-12 MED ORDER — FENTANYL CITRATE 0.05 MG/ML IJ SOLN
INTRAMUSCULAR | Status: AC
  Filled 2013-08-12: qty 2

## 2013-08-12 MED ORDER — ASPIRIN EC 325 MG PO TBEC
325.0000 mg | DELAYED_RELEASE_TABLET | ORAL | Status: DC
  Administered 2013-08-13 – 2013-08-23 (×6): 325 mg via ORAL
  Filled 2013-08-12 (×7): qty 1

## 2013-08-12 MED ORDER — ENOXAPARIN SODIUM 30 MG/0.3ML ~~LOC~~ SOLN
30.0000 mg | SUBCUTANEOUS | Status: DC
  Administered 2013-08-12 – 2013-08-13 (×2): 30 mg via SUBCUTANEOUS
  Filled 2013-08-12 (×3): qty 0.3

## 2013-08-12 MED ORDER — LIDOCAINE HCL (CARDIAC) 20 MG/ML IV SOLN
INTRAVENOUS | Status: AC
  Filled 2013-08-12: qty 5

## 2013-08-12 MED ORDER — LACTATED RINGERS IV SOLN
INTRAVENOUS | Status: DC | PRN
  Administered 2013-08-12: 17:00:00 via INTRAVENOUS

## 2013-08-12 MED ORDER — VITAMIN D3 25 MCG (1000 UNIT) PO TABS
1000.0000 [IU] | ORAL_TABLET | Freq: Every morning | ORAL | Status: DC
  Administered 2013-08-13 – 2013-08-24 (×10): 1000 [IU] via ORAL
  Filled 2013-08-12 (×13): qty 1

## 2013-08-12 MED ORDER — CLINDAMYCIN PHOSPHATE 600 MG/50ML IV SOLN
600.0000 mg | Freq: Three times a day (TID) | INTRAVENOUS | Status: DC
  Administered 2013-08-12 – 2013-08-25 (×38): 600 mg via INTRAVENOUS
  Filled 2013-08-12 (×39): qty 50

## 2013-08-12 MED ORDER — ALLOPURINOL 100 MG PO TABS
100.0000 mg | ORAL_TABLET | ORAL | Status: DC
  Administered 2013-08-16: 100 mg via ORAL
  Filled 2013-08-12 (×2): qty 1

## 2013-08-12 MED ORDER — TRAMADOL HCL 50 MG PO TABS: 100.0000 mg | ORAL_TABLET | Freq: Two times a day (BID) | ORAL | Status: DC | PRN

## 2013-08-12 MED ORDER — INSULIN ASPART 100 UNIT/ML ~~LOC~~ SOLN
0.0000 [IU] | Freq: Three times a day (TID) | SUBCUTANEOUS | Status: DC
  Administered 2013-08-13: 1 [IU] via SUBCUTANEOUS
  Administered 2013-08-13: 2 [IU] via SUBCUTANEOUS
  Administered 2013-08-15 – 2013-08-24 (×13): 1 [IU] via SUBCUTANEOUS

## 2013-08-12 MED ORDER — KCL IN DEXTROSE-NACL 20-5-0.45 MEQ/L-%-% IV SOLN
INTRAVENOUS | Status: AC
  Filled 2013-08-12: qty 1000

## 2013-08-12 MED ORDER — ONDANSETRON HCL 4 MG/2ML IJ SOLN
INTRAMUSCULAR | Status: DC | PRN
  Administered 2013-08-12: 4 mg via INTRAVENOUS

## 2013-08-12 MED ORDER — BISACODYL 5 MG PO TBEC: 5.0000 mg | DELAYED_RELEASE_TABLET | Freq: Every day | ORAL | Status: DC | PRN

## 2013-08-12 MED ORDER — ONDANSETRON HCL 4 MG/2ML IJ SOLN: 4.0000 mg | Freq: Four times a day (QID) | INTRAMUSCULAR | Status: DC | PRN

## 2013-08-12 MED ORDER — SODIUM CHLORIDE 0.9 % IV BOLUS (SEPSIS)
1000.0000 mL | Freq: Once | INTRAVENOUS | Status: AC
  Administered 2013-08-12: 1000 mL via INTRAVENOUS

## 2013-08-12 MED ORDER — POLYVINYL ALCOHOL 1.4 % OP SOLN
2.0000 [drp] | Freq: Every day | OPHTHALMIC | Status: DC | PRN
  Filled 2013-08-12: qty 15

## 2013-08-12 MED ORDER — KCL IN DEXTROSE-NACL 20-5-0.45 MEQ/L-%-% IV SOLN
INTRAVENOUS | Status: DC
  Administered 2013-08-12 – 2013-08-13 (×2): via INTRAVENOUS
  Filled 2013-08-12: qty 1000

## 2013-08-12 MED ORDER — SODIUM CHLORIDE 0.9 % IV SOLN: INTRAVENOUS | Status: DC

## 2013-08-12 MED ORDER — VANCOMYCIN HCL 10 G IV SOLR: 1500.0000 mg | INTRAVENOUS | Status: DC

## 2013-08-12 MED ORDER — ASPIRIN EC 81 MG PO TBEC
243.0000 mg | DELAYED_RELEASE_TABLET | ORAL | Status: DC
  Administered 2013-08-16 – 2013-08-24 (×4): 243 mg via ORAL
  Filled 2013-08-12 (×6): qty 3

## 2013-08-12 MED ORDER — MAGNESIUM CITRATE PO SOLN: 1.0000 | Freq: Once | ORAL | Status: AC | PRN

## 2013-08-12 MED ORDER — SUCCINYLCHOLINE CHLORIDE 20 MG/ML IJ SOLN
INTRAMUSCULAR | Status: DC | PRN
  Administered 2013-08-12: 100 mg via INTRAVENOUS

## 2013-08-12 MED ORDER — HYDROMORPHONE HCL PF 1 MG/ML IJ SOLN
1.0000 mg | INTRAMUSCULAR | Status: DC | PRN
  Administered 2013-08-12 – 2013-08-14 (×3): 1 mg via INTRAVENOUS
  Filled 2013-08-12 (×3): qty 1

## 2013-08-12 MED ORDER — ACETAMINOPHEN 650 MG RE SUPP
650.0000 mg | Freq: Four times a day (QID) | RECTAL | Status: DC | PRN
  Administered 2013-08-12: 650 mg via RECTAL
  Filled 2013-08-12: qty 1

## 2013-08-12 MED ORDER — ONDANSETRON HCL 4 MG PO TABS: 4.0000 mg | ORAL_TABLET | Freq: Four times a day (QID) | ORAL | Status: DC | PRN

## 2013-08-12 MED ORDER — LIDOCAINE HCL (CARDIAC) 20 MG/ML IV SOLN
INTRAVENOUS | Status: DC | PRN
  Administered 2013-08-12: 100 mg via INTRAVENOUS

## 2013-08-12 MED ORDER — PROPOFOL 10 MG/ML IV BOLUS
INTRAVENOUS | Status: DC | PRN
  Administered 2013-08-12: 100 mg via INTRAVENOUS

## 2013-08-12 MED ORDER — SODIUM CHLORIDE 0.9 % IV SOLN
2000.0000 mg | INTRAVENOUS | Status: AC
  Administered 2013-08-12: 2000 mg via INTRAVENOUS
  Filled 2013-08-12: qty 2000

## 2013-08-12 MED ORDER — FENTANYL CITRATE 0.05 MG/ML IJ SOLN
INTRAMUSCULAR | Status: DC | PRN
  Administered 2013-08-12 (×2): 50 ug via INTRAVENOUS

## 2013-08-12 MED ORDER — PROPOFOL 10 MG/ML IV BOLUS
INTRAVENOUS | Status: AC
  Filled 2013-08-12: qty 20

## 2013-08-12 MED ORDER — ANASTROZOLE 1 MG PO TABS
1.0000 mg | ORAL_TABLET | Freq: Every morning | ORAL | Status: DC
  Administered 2013-08-13 – 2013-08-29 (×16): 1 mg via ORAL
  Filled 2013-08-12 (×19): qty 1

## 2013-08-12 MED ORDER — POLYETHYLENE GLYCOL 3350 17 G PO PACK
17.0000 g | PACK | Freq: Every day | ORAL | Status: DC | PRN
  Filled 2013-08-12: qty 1

## 2013-08-12 MED ORDER — IOHEXOL 300 MG/ML  SOLN
80.0000 mL | Freq: Once | INTRAMUSCULAR | Status: DC | PRN
Start: 2013-08-12 — End: 2013-08-12

## 2013-08-12 MED ORDER — FENTANYL CITRATE 0.05 MG/ML IJ SOLN: 25.0000 ug | INTRAMUSCULAR | Status: DC | PRN

## 2013-08-12 MED ORDER — SODIUM CHLORIDE 0.9 % IJ SOLN
3.0000 mL | Freq: Two times a day (BID) | INTRAMUSCULAR | Status: DC
  Administered 2013-08-15: 3 mL via INTRAVENOUS
  Administered 2013-08-16 – 2013-08-17 (×2): 10 mL via INTRAVENOUS
  Administered 2013-08-18 – 2013-08-21 (×5): 3 mL via INTRAVENOUS

## 2013-08-12 MED ORDER — SODIUM CHLORIDE 0.9 % IV BOLUS (SEPSIS)
500.0000 mL | Freq: Once | INTRAVENOUS | Status: AC
  Administered 2013-08-13: 500 mL via INTRAVENOUS

## 2013-08-12 MED ORDER — ONDANSETRON HCL 4 MG/2ML IJ SOLN
INTRAMUSCULAR | Status: AC
  Filled 2013-08-12: qty 2

## 2013-08-12 MED ORDER — IOHEXOL 300 MG/ML  SOLN
50.0000 mL | Freq: Once | INTRAMUSCULAR | Status: AC | PRN
  Administered 2013-08-12: 50 mL via ORAL

## 2013-08-12 MED ORDER — PIPERACILLIN-TAZOBACTAM 3.375 G IVPB
3.3750 g | Freq: Three times a day (TID) | INTRAVENOUS | Status: DC
  Administered 2013-08-12 – 2013-08-27 (×45): 3.375 g via INTRAVENOUS
  Filled 2013-08-12 (×50): qty 50

## 2013-08-12 MED ORDER — ACETAMINOPHEN 325 MG PO TABS
650.0000 mg | ORAL_TABLET | Freq: Four times a day (QID) | ORAL | Status: DC | PRN
  Administered 2013-08-15: 650 mg via ORAL
  Filled 2013-08-12: qty 2

## 2013-08-12 MED ORDER — ASPIRIN EC 325 MG PO TBEC: 325.0000 mg | DELAYED_RELEASE_TABLET | ORAL | Status: DC

## 2013-08-12 MED ORDER — PROMETHAZINE HCL 25 MG/ML IJ SOLN: 6.2500 mg | INTRAMUSCULAR | Status: DC | PRN

## 2013-08-12 SURGICAL SUPPLY — 28 items
BLADE SURG 15 STRL LF DISP TIS (BLADE) ×1 IMPLANT
BLADE SURG 15 STRL SS (BLADE) ×1
BNDG GAUZE ELAST 4 BULKY (GAUZE/BANDAGES/DRESSINGS) ×4 IMPLANT
CANISTER SUCTION 2500CC (MISCELLANEOUS) ×2 IMPLANT
COVER SURGICAL LIGHT HANDLE (MISCELLANEOUS) ×2 IMPLANT
DECANTER SPIKE VIAL GLASS SM (MISCELLANEOUS) IMPLANT
DRAPE LAPAROSCOPIC ABDOMINAL (DRAPES) IMPLANT
ELECT REM PT RETURN 9FT ADLT (ELECTROSURGICAL) ×2
ELECTRODE REM PT RTRN 9FT ADLT (ELECTROSURGICAL) ×1 IMPLANT
GLOVE BIO SURGEON STRL SZ7.5 (GLOVE) ×6 IMPLANT
GOWN STRL REUS W/TWL LRG LVL3 (GOWN DISPOSABLE) ×4 IMPLANT
KIT BASIN OR (CUSTOM PROCEDURE TRAY) ×2 IMPLANT
NEEDLE HYPO 25X1 1.5 SAFETY (NEEDLE) IMPLANT
NS IRRIG 1000ML POUR BTL (IV SOLUTION) ×2 IMPLANT
PAD ABD 8X10 STRL (GAUZE/BANDAGES/DRESSINGS) IMPLANT
PENCIL BUTTON HOLSTER BLD 10FT (ELECTRODE) ×2 IMPLANT
SPONGE GAUZE 4X4 12PLY (GAUZE/BANDAGES/DRESSINGS) IMPLANT
SPONGE LAP 18X18 X RAY DECT (DISPOSABLE) ×2 IMPLANT
SUT MNCRL AB 4-0 PS2 18 (SUTURE) IMPLANT
SUT VIC AB 3-0 SH 27 (SUTURE)
SUT VIC AB 3-0 SH 27XBRD (SUTURE) IMPLANT
SWAB COLLECTION DEVICE MRSA (MISCELLANEOUS) IMPLANT
SYR BULB 3OZ (MISCELLANEOUS) IMPLANT
SYR CONTROL 10ML LL (SYRINGE) IMPLANT
TOWEL OR 17X26 10 PK STRL BLUE (TOWEL DISPOSABLE) ×2 IMPLANT
TUBE ANAEROBIC SPECIMEN COL (MISCELLANEOUS) ×2 IMPLANT
WATER STERILE IRR 1000ML POUR (IV SOLUTION) IMPLANT
YANKAUER SUCT BULB TIP NO VENT (SUCTIONS) ×2 IMPLANT

## 2013-08-12 NOTE — Progress Notes (Signed)
ANTIBIOTIC CONSULT NOTE - INITIAL  Pharmacy Consult for vancomycin Indication: abscess on buttock  Allergies  Allergen Reactions  . Ace Inhibitors Other (See Comments)    Tolerates lisinopril at home. Pt does not recall allergy.  . Atorvastatin     REACTION: myalgias  . Oxycodone-Acetaminophen     REACTION: confusion, fatigue  . Rosuvastatin     REACTION: leg weakness  . Simvastatin     REACTION: leg weakness  . Codeine Nausea And Vomiting and Rash  . Sulfonamide Derivatives Hives and Rash    Patient Measurements:   From 06/07/13: Wt 113.9 kg Ht  165 cm   Vital Signs: Temp: 98.5 F (36.9 C) (03/02 1214) Temp src: Oral (03/02 1214) BP: 126/56 mmHg (03/02 1214) Pulse Rate: 88 (03/02 1214) Intake/Output from previous day:   Intake/Output from this shift:    Labs:  Recent Labs  08/12/13 1330 08/12/13 1338  WBC 17.9*  --   HGB 12.7 13.9  PLT 187  --   CREATININE  --  1.80*  Estimated CrCl ~ 28 mL/min/72 kg (wt-normalized)   The CrCl is unknown because both a height and weight (above a minimum accepted value) are required for this calculation. No results found for this basename: VANCOTROUGH, VANCOPEAK, VANCORANDOM, GENTTROUGH, GENTPEAK, GENTRANDOM, TOBRATROUGH, TOBRAPEAK, TOBRARND, AMIKACINPEAK, AMIKACINTROU, AMIKACIN,  in the last 72 hours   Microbiology: No results found for this or any previous visit (from the past 720 hour(s)).  Medical History: Past Medical History  Diagnosis Date  . Hypertension   . Hyperlipidemia   . Morbid obesity   . Osteoarthritis   . Gout   . CVA (cerebral infarction)   . IBS (irritable bowel syndrome)   . Diverticulosis   . Esophageal stricture   . Colon polyps     hyperplastic  . Anxiety   . Hiatal hernia     4 cm  . Shingles 02/21/2011  . Skin cancer   . Breast cancer     right breast  . Dysrhythmia     dr bensimhon   . Blood transfusion   . Chronic kidney disease     occ uti's  . GERD (gastroesophageal reflux  disease)   . PONV (postoperative nausea and vomiting)   . DVT of leg (deep venous thrombosis)     left; S/P OR  . Atrial flutter     "sometimes"  . Bronchitis   . Pneumonia     "walking"  . Shortness of breath on exertion   . Anemia   . Stroke 2002    residual:  "little tingling in palm of left hand"  . Kidney stones 1990's  . UTI (lower urinary tract infection)     "I've had a few"  . History of radiation therapy 02/15/12-04/02/12    right breast 4680 cGy/26 sessions, right boost=1400cGy /7 sessions    Medications:  Scheduled:   Infusions:  . sodium chloride 1,000 mL (08/12/13 1405)  . vancomycin 2,000 mg (08/12/13 1455)   PRN:   Assessment: [name] presented to ED with 3 day history of abscess on L buttock along with generalized weakness.  Orders received to begin empiric vancomycin with pharmacy dosing assistance.  History of CKD noted.  SCr was 1.2 in August 2014 but is 1.8 on presentation to ED.  Goal of Therapy:  Appropriate antibiotic dosing for weight and renal function; eradication of infection Vancomycin trough 15-20  Plan:  1. Vancomycin 2000 mg IV x 1 stat in ED 2.  Vancomycin 1500 mg IV q48h, next dose 3/4 at 2pm. 3. Follow serum creatinine, cultures, clinical course. 4. Check vancomycin serum levels if warranted.  Clayburn Pert, PharmD, BCPS Pager: [phone] 08/12/2013  3:00 PM    Felisa Zechman, Julieta Bellini 08/12/2013,2:54 PM

## 2013-08-12 NOTE — Anesthesia Postprocedure Evaluation (Signed)
  Anesthesia Post-op Note  Patient: [name]  Procedure(s) Performed: Procedure(s) (LRB): INCISION AND DRAINAGE ABSCESS (N/A)  Patient Location: PACU  Anesthesia Type: General  Level of Consciousness: awake and alert   Airway and Oxygen Therapy: Patient Spontanous Breathing  Post-op Pain: mild  Post-op Assessment: Post-op Vital signs reviewed, Patient's Cardiovascular Status Stable, Respiratory Function Stable, Patent Airway and No signs of Nausea or vomiting  Last Vitals:  Filed Vitals:   08/12/13 1836  BP: 143/98  Pulse:   Temp: 38.4 C  Resp: 29    Post-op Vital Signs: stable   Complications: No apparent anesthesia complications

## 2013-08-12 NOTE — ED Notes (Signed)
Pt c/o abscess on her left buttock x 3 days, pt also reports generalized weakness.

## 2013-08-12 NOTE — Consult Note (Signed)
General Surgery [facility]  Patient seen and examined in the ER.  Husband at bedside.  Patient has cellulitis involving the perineum rather extensively, especially right labia and right buttock.  Area of full thickness necrosis at base of right labia and medial right anterior buttock.  Plan operative debridement and drainage and open packing under anesthesia in operating room as soon as possible.  The risks and benefits of the procedure have been discussed at length with the patient.  The patient understands the proposed procedure, potential alternative treatments, and the course of recovery to be expected.  All of the patient's questions have been answered at this time.  The patient wishes to proceed with surgery.  Earnstine Regal, MD, [facility] Office: [phone]

## 2013-08-12 NOTE — Progress Notes (Signed)
ANTIBIOTIC CONSULT NOTE - Follow Up  Please see previous note from Clayburn Pert, PharmD for full details. Surgery adding Zosyn per pharmacy protocol to Vancomycin for buttock abscess.  Tmax: 98.5 WBCs: 17.9 Renal: SCr 1.8, CrCl 28 ml/min (normalized)  No cultures ordered  Plan: Zosyn 3.375gm IV q8h (4hr extended infusions) since CrCl > 20 ml/min Monitor renal function closely and adjust as needed  Peggyann Juba, PharmD, BCPS Pager: [phone] 08/12/2013 4:23 PM

## 2013-08-12 NOTE — Anesthesia Preprocedure Evaluation (Signed)
Anesthesia Evaluation  Patient identified by MRN, date of birth, ID band Patient awake    Reviewed: Allergy & Precautions, H&P , NPO status , Patient's Chart, lab work & pertinent test results  Airway Mallampati: II TM Distance: <3 FB Neck ROM: Full    Dental no notable dental hx.    Pulmonary neg pulmonary ROS,  breath sounds clear to auscultation  Pulmonary exam normal       Cardiovascular hypertension, Pt. on medications DVT Rhythm:Regular Rate:Normal     Neuro/Psych CVA negative psych ROS   GI/Hepatic negative GI ROS, Neg liver ROS,   Endo/Other  diabetesMorbid obesity  Renal/GU Renal InsufficiencyRenal disease  negative genitourinary   Musculoskeletal negative musculoskeletal ROS (+)   Abdominal (+) + obese,   Peds negative pediatric ROS (+)  Hematology negative hematology ROS (+)   Anesthesia Other Findings   Reproductive/Obstetrics negative OB ROS                           Anesthesia Physical Anesthesia Plan  ASA: III  Anesthesia Plan: General   Post-op Pain Management:    Induction: Intravenous and Rapid sequence  Airway Management Planned: Oral ETT  Additional Equipment:   Intra-op Plan:   Post-operative Plan: Extubation in OR  Informed Consent: I have reviewed the patients History and Physical, chart, labs and discussed the procedure including the risks, benefits and alternatives for the proposed anesthesia with the patient or authorized representative who has indicated his/her understanding and acceptance.   Dental advisory given  Plan Discussed with: CRNA and Surgeon  Anesthesia Plan Comments:         Anesthesia Quick Evaluation

## 2013-08-12 NOTE — H&P (View-Only) (Signed)
[name] 1929-07-01  [id number].   Primary Care MD: Dr. [name] Requesting MD: Dr. Blanchie Dessert Chief Complaint/Reason for Consult: soft tissue infection of perineum HPI: This is an 80 yo female with multiple medical problems who states that on Friday she noticed a "bump" in her perineum.  It just progressed over the weekend. She did not take her temperature and is unsure if she ran fevers.  She did get chills.  Her husband called EMS today because the patient continued to have pain and noticed this area was worsening.  She denies CP or SOB.  She admits to difficulty starting to urinate due to swelling and pain, but otherwise has had normal urine output.  She presents today for evaluation.  We have been asked to see for further recommendations.  ROS: Please see HPI, otherwise all other systems are currently negative.  She did just start having diarrhea today.  Family History  Problem Relation Age of Onset  . Breast cancer Sister     x 2  . Diabetes Sister   . Prostate cancer Brother   . Colon cancer Neg Hx   . Stroke Mother   . Kidney failure Sister   . Heart disease Paternal Uncle     multiple  . Heart disease Maternal Uncle     multiple    Past Medical History  Diagnosis Date  . Hypertension   . Hyperlipidemia   . Morbid obesity   . Osteoarthritis   . Gout   . CVA (cerebral infarction)   . IBS (irritable bowel syndrome)   . Diverticulosis   . Esophageal stricture   . Colon polyps     hyperplastic  . Anxiety   . Hiatal hernia     4 cm  . Shingles 02/21/2011  . Skin cancer   . Breast cancer     right breast  . Dysrhythmia     dr bensimhon   . Blood transfusion   . Chronic kidney disease     occ uti's  . GERD (gastroesophageal reflux disease)   . PONV (postoperative nausea and vomiting)   . DVT of leg (deep venous thrombosis)     left; S/P OR  . Atrial flutter     "sometimes"  . Bronchitis   . Pneumonia     "walking"  . Shortness of breath on  exertion   . Anemia   . Stroke 2002    residual:  "little tingling in palm of left hand"  . Kidney stones 1990's  . UTI (lower urinary tract infection)     "I've had a few"  . History of radiation therapy 02/15/12-04/02/12    right breast 4680 cGy/26 sessions, right boost=1400cGy /7 sessions    Past Surgical History  Procedure Laterality Date  . Abdominal hysterectomy    . Bile duct stent placement  ~ 01/2011  . Pelvic bone tumor removal      twice in 2000's  . Colonic perforation repair  ~ 2000  . Total hip arthroplasty  1980's    right  . Joint replacement    . Colon surgery      hole in intestine ,tumors?  . Tonsillectomy  ~ 1946  . Cholecystectomy  03/23/11    lap chole   . Replacement total knee bilateral  ~ 2008; ~ 2010    right; left  . Cataract extraction w/ intraocular lens  implant, bilateral  1990's  . Hemorrhoid surgery  2012  . Breast lumpectomy  06/29/11    right  . Breast lumpectomy  06/29/2011    Procedure: BREAST LUMPECTOMY WITH EXCISION OF SENTINEL NODE;  Surgeon: Pedro Earls, MD;  Location: [place];  Service: General;  Laterality: Right;  right sentinel node mapping,right sentinel node biopsy, needle localization right breast lumpectomy  . Incision and drainage breast abscess      right breast seroma    Social History:  reports that she has never smoked. She has never used smokeless tobacco. She reports that she does not drink alcohol or use illicit drugs.  Allergies:  Allergies  Allergen Reactions  . Ace Inhibitors Other (See Comments)    Tolerates lisinopril at home. Pt does not recall allergy.  . Atorvastatin     REACTION: myalgias  . Oxycodone-Acetaminophen     REACTION: confusion, fatigue  . Rosuvastatin     REACTION: leg weakness  . Simvastatin     REACTION: leg weakness  . Codeine Nausea And Vomiting and Rash  . Sulfonamide Derivatives Hives and Rash     (Not in a hospital admission)  Blood pressure 126/56, pulse 88, temperature 98.5  F (36.9 C), temperature source Oral, resp. rate 16, SpO2 92.00%. Physical Exam: General: pleasant, morbidly obese white female who is laying in bed in mild distress secondary to discomfort. HEENT: head is normocephalic, atraumatic.  Sclera are noninjected.  PERRL.  Ears and nose without any masses or lesions.  Mouth is pink and moist Heart: regular, rate, and rhythm.  Normal s1,s2. No obvious murmurs, gallops, or rubs noted.  Palpable radial and pedal pulses bilaterally Lungs: CTAB, no wheezes, rhonchi, or rales noted.  Respiratory effort nonlabored Abd: soft, NT, ND, +BS, morbidly obese. no masses, hernias, or organomegaly MS: all 4 extremities are symmetrical with no cyanosis, clubbing, or edema. Skin: warm and dry.  She has significant induration and erythema of her left labia with significant edema.  She also has an area of necrosis about 3x3cm in her true perineum.  Her erythema and induration extends back into her buttock.  She has significant tenderness of all of these areas. Psych: A&Ox3 with an appropriate affect.    Results for orders placed during the hospital encounter of 08/12/13 (from the past 48 hour(s))  URINALYSIS, ROUTINE W REFLEX MICROSCOPIC     Status: Abnormal   Collection Time    08/12/13  1:24 PM      Result Value Ref Range   Color, Urine AMBER (*) YELLOW   Comment: BIOCHEMICALS MAY BE AFFECTED BY COLOR   APPearance CLOUDY (*) CLEAR   Specific Gravity, Urine 1.019  1.005 - 1.030   pH 5.0  5.0 - 8.0   Glucose, UA NEGATIVE  NEGATIVE mg/dL   Hgb urine dipstick SMALL (*) NEGATIVE   Bilirubin Urine SMALL (*) NEGATIVE   Ketones, ur NEGATIVE  NEGATIVE mg/dL   Protein, ur NEGATIVE  NEGATIVE mg/dL   Urobilinogen, UA 1.0  0.0 - 1.0 mg/dL   Nitrite NEGATIVE  NEGATIVE   Leukocytes, UA NEGATIVE  NEGATIVE  URINE MICROSCOPIC-ADD ON     Status: Abnormal   Collection Time    08/12/13  1:24 PM      Result Value Ref Range   Squamous Epithelial / LPF RARE  RARE   WBC, UA 0-2   <3 WBC/hpf   RBC / HPF 0-2  <3 RBC/hpf   Bacteria, UA RARE  RARE   Casts HYALINE CASTS (*) NEGATIVE   Urine-Other AMORPHOUS URATES/PHOSPHATES    CBC WITH DIFFERENTIAL  Status: Abnormal   Collection Time    08/12/13  1:30 PM      Result Value Ref Range   WBC 17.9 (*) 4.0 - 10.5 K/uL   RBC 4.22  3.87 - 5.11 MIL/uL   Hemoglobin 12.7  12.0 - 15.0 g/dL   HCT 39.5  36.0 - 46.0 %   MCV 93.6  78.0 - 100.0 fL   MCH 30.1  26.0 - 34.0 pg   MCHC 32.2  30.0 - 36.0 g/dL   RDW 15.6 (*) 11.5 - 15.5 %   Platelets 187  150 - 400 K/uL   Neutrophils Relative % 84 (*) 43 - 77 %   Neutro Abs 15.1 (*) 1.7 - 7.7 K/uL   Lymphocytes Relative 6 (*) 12 - 46 %   Lymphs Abs 1.0  0.7 - 4.0 K/uL   Monocytes Relative 10  3 - 12 %   Monocytes Absolute 1.8 (*) 0.1 - 1.0 K/uL   Eosinophils Relative 0  0 - 5 %   Eosinophils Absolute 0.0  0.0 - 0.7 K/uL   Basophils Relative 0  0 - 1 %   Basophils Absolute 0.0  0.0 - 0.1 K/uL  I-STAT CHEM 8, ED     Status: Abnormal   Collection Time    08/12/13  1:38 PM      Result Value Ref Range   Sodium 139  137 - 147 mEq/L   Potassium 4.3  3.7 - 5.3 mEq/L   Chloride 102  96 - 112 mEq/L   BUN 59 (*) 6 - 23 mg/dL   Creatinine, Ser 1.80 (*) 0.50 - 1.10 mg/dL   Glucose, Bld 146 (*) 70 - 99 mg/dL   Calcium, Ion 1.16  1.13 - 1.30 mmol/L   TCO2 29  0 - 100 mmol/L   Hemoglobin 13.9  12.0 - 15.0 g/dL   HCT 41.0  36.0 - 46.0 %   No results found.     Assessment/Plan 1. Necrotizing soft tissue infection of perineum 2. HTN Patient Active Problem List   Diagnosis Date Noted  . Acute bronchitis 06/07/2013  . Wheezing 06/07/2013  . Epistaxis 06/27/2012  . Wound healing, delayed 01/26/2012  . Diverticulosis of colon with hemorrhage 12/19/2011  . Acute posthemorrhagic anemia 12/19/2011  . Cancer of upper-outer quadrant of female breast 08/01/2011  . Breast cancer, right breast-lobular s/p lumpectomy and sentinel node biopsy 06/30/2011  . Shingles 02/21/2011  .  Preventative health care 02/13/2011  . Bile duct calculus with nonacute cholecystitis 01/21/2011  . Morbid obesity 01/21/2011  . TREMOR 08/17/2010  . DIABETES MELLITUS, TYPE II 01/09/2008  . PERIPHERAL NEUROPATHY 10/17/2007  . FATIGUE 10/17/2007  . COLONIC POLYPS, HX OF 02/12/2007  . POLYP, COLON 02/10/2007  . HYPERLIPIDEMIA 02/10/2007  . GOUT 02/10/2007  . ANXIETY 02/10/2007  . HYPERTENSION 02/10/2007  . CVA 02/10/2007  . DIVERTICULOSIS, COLON 02/10/2007  . IRRITABLE BOWEL, PREDOMINANTLY CONSTIPATION 02/10/2007   Plan: 1. Medicine is going to admit this patient per the EDP.  I think this is appropriate given her multitude of medical problems. 2. We will add zosyn to her vancomycin 3. We will take her urgently to the operating room today for debridement. 4. Cont IVF hydration to help with creatinine, however, she does have some CKD per her history. 5. Defer to medicine for further treatment. 6. We have discussed plan with the patient and her husband who are agreeable.  Aili Casillas E 08/12/2013, 4:07 PM Pager: [phone]

## 2013-08-12 NOTE — Transfer of Care (Signed)
Immediate Anesthesia Transfer of Care Note  Patient: [name]  Procedure(s) Performed: Procedure(s): INCISION AND DRAINAGE ABSCESS (N/A)  Patient Location: PACU  Anesthesia Type:General  Level of Consciousness: awake and alert   Airway & Oxygen Therapy: Patient Spontanous Breathing  Post-op Assessment: Report given to PACU RN and Post -op Vital signs reviewed and stable  Post vital signs: Reviewed and stable  Complications: No apparent anesthesia complications

## 2013-08-12 NOTE — Progress Notes (Signed)
Utilization Review completed.  Eimi Viney RN CM  

## 2013-08-12 NOTE — ED Provider Notes (Addendum)
CSN: [id number]     Arrival date & time 08/12/13  1157 History   First MD Initiated Contact with Patient 08/12/13 1227     Chief Complaint  Patient presents with  . Abscess     (Consider location/radiation/quality/duration/timing/severity/associated sxs/prior Treatment) Patient is a 80 y.o. female presenting with abscess. The history is provided by the patient.  Abscess Location:  Ano-genital Ano-genital abscess location:  R buttock, vagina and perineum Abscess quality: draining, fluctuance, induration, painful, redness and warmth   Red streaking: yes   Duration:  3 days Progression:  Worsening Pain details:    Quality:  Sharp and burning   Severity:  Moderate   Timing:  Constant   Progression:  Worsening Chronicity:  New Context: not diabetes, not immunosuppression, not injected drug use, not insect bite/sting and not skin injury   Relieved by:  Nothing Worsened by:  Draining/squeezing Ineffective treatments:  Draining/squeezing and warm compresses Associated symptoms: fatigue   Associated symptoms: no fever, no nausea and no vomiting   Risk factors: no hx of MRSA and no prior abscess     Past Medical History  Diagnosis Date  . Hypertension   . Hyperlipidemia   . Morbid obesity   . Osteoarthritis   . Gout   . CVA (cerebral infarction)   . IBS (irritable bowel syndrome)   . Diverticulosis   . Esophageal stricture   . Colon polyps     hyperplastic  . Anxiety   . Hiatal hernia     4 cm  . Shingles 02/21/2011  . Skin cancer   . Breast cancer     right breast  . Dysrhythmia     dr bensimhon   . Blood transfusion   . Chronic kidney disease     occ uti's  . GERD (gastroesophageal reflux disease)   . PONV (postoperative nausea and vomiting)   . DVT of leg (deep venous thrombosis)     left; S/P OR  . Atrial flutter     "sometimes"  . Bronchitis   . Pneumonia     "walking"  . Shortness of breath on exertion   . Anemia   . Stroke 2002    residual:  "little  tingling in palm of left hand"  . Kidney stones 1990's  . UTI (lower urinary tract infection)     "I've had a few"  . History of radiation therapy 02/15/12-04/02/12    right breast 4680 cGy/26 sessions, right boost=1400cGy /7 sessions   Past Surgical History  Procedure Laterality Date  . Abdominal hysterectomy    . Bile duct stent placement  ~ 01/2011  . Pelvic bone tumor removal      twice in 2000's  . Colonic perforation repair  ~ 2000  . Total hip arthroplasty  1980's    right  . Joint replacement    . Colon surgery      hole in intestine ,tumors?  . Tonsillectomy  ~ 1946  . Cholecystectomy  03/23/11    lap chole   . Replacement total knee bilateral  ~ 2008; ~ 2010    right; left  . Cataract extraction w/ intraocular lens  implant, bilateral  1990's  . Hemorrhoid surgery  2012  . Breast lumpectomy  06/29/11    right  . Breast lumpectomy  06/29/2011    Procedure: BREAST LUMPECTOMY WITH EXCISION OF SENTINEL NODE;  Surgeon: Pedro Earls, MD;  Location: [facility];  Service: General;  Laterality: Right;  right sentinel node mapping,right  sentinel node biopsy, needle localization right breast lumpectomy  . Incision and drainage breast abscess      right breast seroma   Family History  Problem Relation Age of Onset  . Breast cancer Sister     x 2  . Diabetes Sister   . Prostate cancer Brother   . Colon cancer Neg Hx   . Stroke Mother   . Kidney failure Sister   . Heart disease Paternal Uncle     multiple  . Heart disease Maternal Uncle     multiple   History  Substance Use Topics  . Smoking status: Never Smoker   . Smokeless tobacco: Never Used  . Alcohol Use: No   OB History   Grav Para Term Preterm Abortions TAB SAB Ect Mult Living                 Review of Systems  Constitutional: Positive for fatigue. Negative for fever.  Gastrointestinal: Negative for nausea and vomiting.  All other systems reviewed and are negative.      Allergies  Ace inhibitors;  Atorvastatin; Oxycodone-acetaminophen; Rosuvastatin; Simvastatin; Codeine; and Sulfonamide derivatives  Home Medications   Current Outpatient Rx  Name  Route  Sig  Dispense  Refill  . allopurinol (ZYLOPRIM) 100 MG tablet   Oral   Take 100 mg by mouth every other day.         . anastrozole (ARIMIDEX) 1 MG tablet   Oral   Take 1 mg by mouth every morning.         Marland Kitchen aspirin 325 MG EC tablet   Oral   Take 325 mg by mouth every other day.         Marland Kitchen aspirin EC 81 MG tablet   Oral   Take 243 mg by mouth every other day.          . cholecalciferol (VITAMIN D) 1000 UNITS tablet   Oral   Take 1,000 Units by mouth every morning.          . furosemide (LASIX) 40 MG tablet   Oral   Take 40 mg by mouth every morning.         Marland Kitchen lisinopril (PRINIVIL,ZESTRIL) 20 MG tablet   Oral   Take 40 mg by mouth daily after breakfast.         . polyvinyl alcohol (LIQUIFILM TEARS) 1.4 % ophthalmic solution   Both Eyes   Place 2 drops into both eyes daily as needed for dry eyes.         . traMADol-acetaminophen (ULTRACET) 37.5-325 MG per tablet   Oral   Take 1 tablet by mouth every 6 (six) hours as needed for pain.   120 tablet   2    BP 126/56  Pulse 88  Temp(Src) 98.5 F (36.9 C) (Oral)  Resp 16  SpO2 92% Physical Exam  Nursing note and vitals reviewed. Constitutional: She is oriented to person, place, and time. She appears well-developed and well-nourished. No distress.  HENT:  Head: Normocephalic and atraumatic.  Mouth/Throat: Oropharynx is clear and moist. Mucous membranes are dry.  Eyes: Conjunctivae and EOM are normal. Pupils are equal, round, and reactive to light.  Neck: Normal range of motion. Neck supple.  Cardiovascular: Normal rate, regular rhythm and intact distal pulses.   No murmur heard. Pulmonary/Chest: Effort normal and breath sounds normal. No respiratory distress. She has no wheezes. She has no rales.  Abdominal: Soft. She exhibits no distension.  There is  no tenderness. There is no rebound and no guarding.  Genitourinary:    There is erythema and tenderness around the vagina.  Musculoskeletal: Normal range of motion. She exhibits no edema and no tenderness.  Neurological: She is alert and oriented to person, place, and time.  Skin: Skin is warm and dry. No rash noted. No erythema.  Psychiatric: She has a normal mood and affect. Her behavior is normal.    ED Course  Procedures (including critical care time) Labs Review Labs Reviewed  CBC WITH DIFFERENTIAL - Abnormal; Notable for the following:    WBC 17.9 (*)    RDW 15.6 (*)    Neutrophils Relative % 84 (*)    Neutro Abs 15.1 (*)    Lymphocytes Relative 6 (*)    Monocytes Absolute 1.8 (*)    All other components within normal limits  URINALYSIS, ROUTINE W REFLEX MICROSCOPIC - Abnormal; Notable for the following:    Color, Urine AMBER (*)    APPearance CLOUDY (*)    Hgb urine dipstick SMALL (*)    Bilirubin Urine SMALL (*)    All other components within normal limits  URINE MICROSCOPIC-ADD ON - Abnormal; Notable for the following:    Casts HYALINE CASTS (*)    All other components within normal limits  I-STAT CHEM 8, ED - Abnormal; Notable for the following:    BUN 59 (*)    Creatinine, Ser 1.80 (*)    Glucose, Bld 146 (*)    All other components within normal limits   Imaging Review Dg Chest Port 1 View  08/12/2013   CLINICAL DATA:  Pre-op for buttock abscess surgery.  EXAM: PORTABLE CHEST - 1 VIEW  COMPARISON:  DG CHEST 2 VIEW dated 06/07/2013; DG CHEST 2 VIEW dated 06/11/2012; DG CHEST 2 VIEW dated 03/17/2011  FINDINGS: 1618 hr. There are lower lung volumes with increased patchy perihilar opacities in the right lung. The left lung is clear. The heart size and mediastinal contours are stable. There is no pneumothorax or significant pleural effusion. The osseous structures appear unchanged.  IMPRESSION: New patchy perihilar opacities in the right lung, likely atelectasis.  No consolidation or significant changes demonstrated.   Electronically Signed   By: Camie Patience M.D.   On: 08/12/2013 16:28     EKG Interpretation None      MDM   Final diagnoses:  Cellulitis and abscess    Patient here with a three-day history of right buttocks and labial abscess and swelling. Patient denies fever but states systemically she has felt for the last 5 days. She has had some drainage of dark blood but otherwise no frank pus from the area. Patient has significant edema, induration and swelling in the right labia and perineal area. There is no palpable crepitus concerning for Fournier's. Patient has no history of diabetes or prior infections. Deep tendon labial swelling she's had some difficulty urinating but denies symptoms of retention at this time. Is a small area that is necrotic and only able to express a small amount of dark blood.  CT of the pelvis to further evaluate area pending. I-STAT with mild elevation of creatinine to 1.8 from baseline of 1.2. Patient appears dehydrated and was given IV fluids. CBC, UA pending. Catheter placed. Patient will need IV antibiotics and admission however based on CT findings we'll discuss with surgery  3:52 PM General surgery evaluating pt.  CT pending.  Leukocytosis of 17,000 and mild elevation in Cr of 1.8.  Vancomycin given.  Seen by surgery and will go to the OR later tonight.  Will have pt admitted to medicine and NPO at this time.  Blanchie Dessert, MD 08/12/13 1607  Blanchie Dessert, MD 08/12/13 [phone]

## 2013-08-12 NOTE — H&P (View-Only) (Signed)
[facility]  Patient seen and examined in the ER.  Husband at bedside.  Patient has cellulitis involving the perineum rather extensively, especially right labia and right buttock.  Area of full thickness necrosis at base of right labia and medial right anterior buttock.  Plan operative debridement and drainage and open packing under anesthesia in operating room as soon as possible.  The risks and benefits of the procedure have been discussed at length with the patient.  The patient understands the proposed procedure, potential alternative treatments, and the course of recovery to be expected.  All of the patient's questions have been answered at this time.  The patient wishes to proceed with surgery.  Earnstine Regal, MD, [facility] Office: [phone]

## 2013-08-12 NOTE — Progress Notes (Signed)
Pt with temp of 101.1. Pt given rectal tylenol. Pt also with soft pressures. [facility] called awaiting call back.

## 2013-08-12 NOTE — Consult Note (Signed)
[name] Aug 20, 1929  [id number].   Primary Care MD: Dr. [name] Requesting MD: Dr. Blanchie Dessert Chief Complaint/Reason for Consult: soft tissue infection of perineum HPI: This is an 80 yo female with multiple medical problems who states that on Friday she noticed a "bump" in her perineum.  It just progressed over the weekend. She did not take her temperature and is unsure if she ran fevers.  She did get chills.  Her husband called EMS today because the patient continued to have pain and noticed this area was worsening.  She denies CP or SOB.  She admits to difficulty starting to urinate due to swelling and pain, but otherwise has had normal urine output.  She presents today for evaluation.  We have been asked to see for further recommendations.  ROS: Please see HPI, otherwise all other systems are currently negative.  She did just start having diarrhea today.  Family History  Problem Relation Age of Onset  . Breast cancer Sister     x 2  . Diabetes Sister   . Prostate cancer Brother   . Colon cancer Neg Hx   . Stroke Mother   . Kidney failure Sister   . Heart disease Paternal Uncle     multiple  . Heart disease Maternal Uncle     multiple    Past Medical History  Diagnosis Date  . Hypertension   . Hyperlipidemia   . Morbid obesity   . Osteoarthritis   . Gout   . CVA (cerebral infarction)   . IBS (irritable bowel syndrome)   . Diverticulosis   . Esophageal stricture   . Colon polyps     hyperplastic  . Anxiety   . Hiatal hernia     4 cm  . Shingles 02/21/2011  . Skin cancer   . Breast cancer     right breast  . Dysrhythmia     dr bensimhon   . Blood transfusion   . Chronic kidney disease     occ uti's  . GERD (gastroesophageal reflux disease)   . PONV (postoperative nausea and vomiting)   . DVT of leg (deep venous thrombosis)     left; S/P OR  . Atrial flutter     "sometimes"  . Bronchitis   . Pneumonia     "walking"  . Shortness of breath on  exertion   . Anemia   . Stroke 2002    residual:  "little tingling in palm of left hand"  . Kidney stones 1990's  . UTI (lower urinary tract infection)     "I've had a few"  . History of radiation therapy 02/15/12-04/02/12    right breast 4680 cGy/26 sessions, right boost=1400cGy /7 sessions    Past Surgical History  Procedure Laterality Date  . Abdominal hysterectomy    . Bile duct stent placement  ~ 01/2011  . Pelvic bone tumor removal      twice in 2000's  . Colonic perforation repair  ~ 2000  . Total hip arthroplasty  1980's    right  . Joint replacement    . Colon surgery      hole in intestine ,tumors?  . Tonsillectomy  ~ 1946  . Cholecystectomy  03/23/11    lap chole   . Replacement total knee bilateral  ~ 2008; ~ 2010    right; left  . Cataract extraction w/ intraocular lens  implant, bilateral  1990's  . Hemorrhoid surgery  2012  . Breast lumpectomy  06/29/11    right  . Breast lumpectomy  06/29/2011    Procedure: BREAST LUMPECTOMY WITH EXCISION OF SENTINEL NODE;  Surgeon: Pedro Earls, MD;  Location: [facility];  Service: General;  Laterality: Right;  right sentinel node mapping,right sentinel node biopsy, needle localization right breast lumpectomy  . Incision and drainage breast abscess      right breast seroma    Social History:  reports that she has never smoked. She has never used smokeless tobacco. She reports that she does not drink alcohol or use illicit drugs.  Allergies:  Allergies  Allergen Reactions  . Ace Inhibitors Other (See Comments)    Tolerates lisinopril at home. Pt does not recall allergy.  . Atorvastatin     REACTION: myalgias  . Oxycodone-Acetaminophen     REACTION: confusion, fatigue  . Rosuvastatin     REACTION: leg weakness  . Simvastatin     REACTION: leg weakness  . Codeine Nausea And Vomiting and Rash  . Sulfonamide Derivatives Hives and Rash     (Not in a hospital admission)  Blood pressure 126/56, pulse 88, temperature 98.5  F (36.9 C), temperature source Oral, resp. rate 16, SpO2 92.00%. Physical Exam: General: pleasant, morbidly obese white female who is laying in bed in mild distress secondary to discomfort. HEENT: head is normocephalic, atraumatic.  Sclera are noninjected.  PERRL.  Ears and nose without any masses or lesions.  Mouth is pink and moist Heart: regular, rate, and rhythm.  Normal s1,s2. No obvious murmurs, gallops, or rubs noted.  Palpable radial and pedal pulses bilaterally Lungs: CTAB, no wheezes, rhonchi, or rales noted.  Respiratory effort nonlabored Abd: soft, NT, ND, +BS, morbidly obese. no masses, hernias, or organomegaly MS: all 4 extremities are symmetrical with no cyanosis, clubbing, or edema. Skin: warm and dry.  She has significant induration and erythema of her left labia with significant edema.  She also has an area of necrosis about 3x3cm in her true perineum.  Her erythema and induration extends back into her buttock.  She has significant tenderness of all of these areas. Psych: A&Ox3 with an appropriate affect.    Results for orders placed during the hospital encounter of 08/12/13 (from the past 48 hour(s))  URINALYSIS, ROUTINE W REFLEX MICROSCOPIC     Status: Abnormal   Collection Time    08/12/13  1:24 PM      Result Value Ref Range   Color, Urine AMBER (*) YELLOW   Comment: BIOCHEMICALS MAY BE AFFECTED BY COLOR   APPearance CLOUDY (*) CLEAR   Specific Gravity, Urine 1.019  1.005 - 1.030   pH 5.0  5.0 - 8.0   Glucose, UA NEGATIVE  NEGATIVE mg/dL   Hgb urine dipstick SMALL (*) NEGATIVE   Bilirubin Urine SMALL (*) NEGATIVE   Ketones, ur NEGATIVE  NEGATIVE mg/dL   Protein, ur NEGATIVE  NEGATIVE mg/dL   Urobilinogen, UA 1.0  0.0 - 1.0 mg/dL   Nitrite NEGATIVE  NEGATIVE   Leukocytes, UA NEGATIVE  NEGATIVE  URINE MICROSCOPIC-ADD ON     Status: Abnormal   Collection Time    08/12/13  1:24 PM      Result Value Ref Range   Squamous Epithelial / LPF RARE  RARE   WBC, UA 0-2   <3 WBC/hpf   RBC / HPF 0-2  <3 RBC/hpf   Bacteria, UA RARE  RARE   Casts HYALINE CASTS (*) NEGATIVE   Urine-Other AMORPHOUS URATES/PHOSPHATES    CBC WITH DIFFERENTIAL  Status: Abnormal   Collection Time    08/12/13  1:30 PM      Result Value Ref Range   WBC 17.9 (*) 4.0 - 10.5 K/uL   RBC 4.22  3.87 - 5.11 MIL/uL   Hemoglobin 12.7  12.0 - 15.0 g/dL   HCT 39.5  36.0 - 46.0 %   MCV 93.6  78.0 - 100.0 fL   MCH 30.1  26.0 - 34.0 pg   MCHC 32.2  30.0 - 36.0 g/dL   RDW 15.6 (*) 11.5 - 15.5 %   Platelets 187  150 - 400 K/uL   Neutrophils Relative % 84 (*) 43 - 77 %   Neutro Abs 15.1 (*) 1.7 - 7.7 K/uL   Lymphocytes Relative 6 (*) 12 - 46 %   Lymphs Abs 1.0  0.7 - 4.0 K/uL   Monocytes Relative 10  3 - 12 %   Monocytes Absolute 1.8 (*) 0.1 - 1.0 K/uL   Eosinophils Relative 0  0 - 5 %   Eosinophils Absolute 0.0  0.0 - 0.7 K/uL   Basophils Relative 0  0 - 1 %   Basophils Absolute 0.0  0.0 - 0.1 K/uL  I-STAT CHEM 8, ED     Status: Abnormal   Collection Time    08/12/13  1:38 PM      Result Value Ref Range   Sodium 139  137 - 147 mEq/L   Potassium 4.3  3.7 - 5.3 mEq/L   Chloride 102  96 - 112 mEq/L   BUN 59 (*) 6 - 23 mg/dL   Creatinine, Ser 1.80 (*) 0.50 - 1.10 mg/dL   Glucose, Bld 146 (*) 70 - 99 mg/dL   Calcium, Ion 1.16  1.13 - 1.30 mmol/L   TCO2 29  0 - 100 mmol/L   Hemoglobin 13.9  12.0 - 15.0 g/dL   HCT 41.0  36.0 - 46.0 %   No results found.     Assessment/Plan 1. Necrotizing soft tissue infection of perineum 2. HTN Patient Active Problem List   Diagnosis Date Noted  . Acute bronchitis 06/07/2013  . Wheezing 06/07/2013  . Epistaxis 06/27/2012  . Wound healing, delayed 01/26/2012  . Diverticulosis of colon with hemorrhage 12/19/2011  . Acute posthemorrhagic anemia 12/19/2011  . Cancer of upper-outer quadrant of female breast 08/01/2011  . Breast cancer, right breast-lobular s/p lumpectomy and sentinel node biopsy 06/30/2011  . Shingles 02/21/2011  .  Preventative health care 02/13/2011  . Bile duct calculus with nonacute cholecystitis 01/21/2011  . Morbid obesity 01/21/2011  . TREMOR 08/17/2010  . DIABETES MELLITUS, TYPE II 01/09/2008  . PERIPHERAL NEUROPATHY 10/17/2007  . FATIGUE 10/17/2007  . COLONIC POLYPS, HX OF 02/12/2007  . POLYP, COLON 02/10/2007  . HYPERLIPIDEMIA 02/10/2007  . GOUT 02/10/2007  . ANXIETY 02/10/2007  . HYPERTENSION 02/10/2007  . CVA 02/10/2007  . DIVERTICULOSIS, COLON 02/10/2007  . IRRITABLE BOWEL, PREDOMINANTLY CONSTIPATION 02/10/2007   Plan: 1. Medicine is going to admit this patient per the EDP.  I think this is appropriate given her multitude of medical problems. 2. We will add zosyn to her vancomycin 3. We will take her urgently to the operating room today for debridement. 4. Cont IVF hydration to help with creatinine, however, she does have some CKD per her history. 5. Defer to medicine for further treatment. 6. We have discussed plan with the patient and her husband who are agreeable.  Javian Nudd E 08/12/2013, 4:07 PM Pager: [phone]

## 2013-08-12 NOTE — Brief Op Note (Signed)
08/12/2013  6:31 PM  PATIENT:  [name]  80 y.o. female  PRE-OPERATIVE DIAGNOSIS:  Necrotizing fasciitis of the perineum  POST-OPERATIVE DIAGNOSIS:  same  PROCEDURE:  Debridement and drainage and open packing of right buttock and right labia  SURGEON:  Surgeon(s) and Role:    * Earnstine Regal, MD - Primary  ANESTHESIA:   general  EBL:     BLOOD ADMINISTERED:none  DRAINS: none   LOCAL MEDICATIONS USED:  NONE  SPECIMEN:  Source of Specimen:  wound cultures to lab  DISPOSITION OF SPECIMEN:  laboratory for culture  COUNTS:  YES  TOURNIQUET:  * No tourniquets in log *  DICTATION: .Other Dictation: Dictation Number [id number]  PLAN OF CARE: Admit to inpatient   PATIENT DISPOSITION:  PACU - hemodynamically stable.   Delay start of Pharmacological VTE agent (>24hrs) due to surgical blood loss or risk of bleeding: yes  Earnstine Regal, MD, [facility] Office: [phone]

## 2013-08-12 NOTE — Interval H&P Note (Signed)
History and Physical Interval Note:  08/12/2013 4:58 PM  [name]  has presented today for surgery, with the diagnosis of PERINEAL ABSCESS.  The various methods of treatment have been discussed with the patient and family. After consideration of risks, benefits and other options for treatment, the patient has consented to    Procedure(s): INCISION AND DRAINAGE ABSCESS (N/A) as a surgical intervention .    The patient's history has been reviewed, patient examined, no change in status, stable for surgery.  I have reviewed the patient's chart and labs.  Questions were answered to the patient's satisfaction.    Earnstine Regal, MD, [facility] Office: [place]

## 2013-08-12 NOTE — H&P (Addendum)
[facility] History and Physical  IVELIS MIMBS [id number] DOB: Jan 10, 1930 DOA: 08/12/2013  Referring physician:  Blanchie Dessert PCP:  [name], MD   Chief Complaint:  Pain in perineum  HPI:  The patient is a 80 y.o. year-old female with history of paroxysmal atrial flutter, T2DM, morbid obesity, DOE, CVA, GERD, post-operative DVT no longer on AC, CKD, breast ca right breast, anxiety, anemia who presents with increasing pain of the perineum.  The patient was last at their baseline health about a week ago.  About three days ago, she noticed a painful bump on her right posterior labia/anterior buttock.  It has quickly enlarged and become very painful, red, and swollen.  She developed chills, nausea and diarrhea.  She has not been eating or drinking as well in the last day.  She took pain medication (ultram) which did not adequately treat her pain so her husband brought her to the ER.   In the ER, T 43F, HR in low 100s, BP 126/56, 92% on RA.  Labs notable for WBC 17.9, BUN 59 and creatinine 1.8 up from baseline 1.2.  CXR demonstarated peribronchial infiltrate vs. Atelectasis.  ECG sinus tachycardia with suggestion of previous inferior MI with Q-waves in inferior leads, however, these are stable from prior.  Possibly slight ST segment scooping of the inferior leads also.  Exam demonstrated necrotizing fasciitis of the right perineum.  General surgery has been consulted and is taking patient to OR immediately for emergent surgery.  She has not had blood cultures, however, she is currently receiving vancomycin and zosyn is ordered.    Review of Systems:  General:  POSITIVE chills, weight loss or gain HEENT:  Denies changes to hearing and vision, rhinorrhea, sinus congestion, sore throat CV:  Denies chest pain and palpitations, lower extremity edema.  PULM:  SOB and cough since December GI:  Per HPI GU:  Decreased urination and pain on right labia when urinating. Difficult to urinate ENDO:   Denies polyuria, polydipsia.   HEME:  Denies hematemesis, blood in stools, melena, abnormal bruising or bleeding.  LYMPH:  Denies lymphadenopathy.   MSK:  Denies arthralgias, myalgias.   DERM:  Denies skin rash or ulcer.   NEURO:  Denies focal numbness, weakness, slurred speech, confusion, facial droop.  PSYCH:  Denies anxiety and depression.    Past Medical History  Diagnosis Date  . Hypertension   . Hyperlipidemia   . Morbid obesity   . Osteoarthritis   . Gout   . CVA (cerebral infarction)   . IBS (irritable bowel syndrome)   . Diverticulosis   . Esophageal stricture   . Colon polyps     hyperplastic  . Anxiety   . Hiatal hernia     4 cm  . Shingles 02/21/2011  . Skin cancer   . Breast cancer     right breast  . Dysrhythmia     dr bensimhon   . Blood transfusion   . Chronic kidney disease     occ uti's  . GERD (gastroesophageal reflux disease)   . PONV (postoperative nausea and vomiting)   . DVT of leg (deep venous thrombosis)     left; S/P OR  . Atrial flutter     "sometimes"  . Bronchitis   . Pneumonia     "walking"  . Shortness of breath on exertion   . Anemia   . Stroke 2002    residual:  "little tingling in palm of left hand"  . Kidney stones  1990's  . UTI (lower urinary tract infection)     "I've had a few"  . History of radiation therapy 02/15/12-04/02/12    right breast 4680 cGy/26 sessions, right boost=1400cGy /7 sessions   Past Surgical History  Procedure Laterality Date  . Abdominal hysterectomy    . Bile duct stent placement  ~ 01/2011  . Pelvic bone tumor removal      twice in 2000's  . Colonic perforation repair  ~ 2000  . Total hip arthroplasty  1980's    right  . Joint replacement    . Colon surgery      hole in intestine ,tumors?  . Tonsillectomy  ~ 1946  . Cholecystectomy  03/23/11    lap chole   . Replacement total knee bilateral  ~ 2008; ~ 2010    right; left  . Cataract extraction w/ intraocular lens  implant, bilateral  1990's   . Hemorrhoid surgery  2012  . Breast lumpectomy  06/29/11    right  . Breast lumpectomy  06/29/2011    Procedure: BREAST LUMPECTOMY WITH EXCISION OF SENTINEL NODE;  Surgeon: Pedro Earls, MD;  Location: [place];  Service: General;  Laterality: Right;  right sentinel node mapping,right sentinel node biopsy, needle localization right breast lumpectomy  . Incision and drainage breast abscess      right breast seroma   Social History:  reports that she has never smoked. She has never used smokeless tobacco. She reports that she does not drink alcohol or use illicit drugs. Lives at home with her husband.  Uses a walker for ambulation  Allergies  Allergen Reactions  . Ace Inhibitors Other (See Comments)    Tolerates lisinopril at home. Pt does not recall allergy.  . Atorvastatin     REACTION: myalgias  . Oxycodone-Acetaminophen     REACTION: confusion, fatigue  . Rosuvastatin     REACTION: leg weakness  . Simvastatin     REACTION: leg weakness  . Codeine Nausea And Vomiting and Rash  . Sulfonamide Derivatives Hives and Rash    Family History  Problem Relation Age of Onset  . Breast cancer Sister     x 2  . Diabetes Sister   . Prostate cancer Brother   . Colon cancer Neg Hx   . Stroke Mother   . Kidney failure Sister   . Heart disease Paternal Uncle     multiple  . Heart disease Maternal Uncle     multiple     Prior to Admission medications   Medication Sig Start Date End Date Taking? Authorizing Provider  allopurinol (ZYLOPRIM) 100 MG tablet Take 100 mg by mouth every other day.   Yes Historical Provider, MD  anastrozole (ARIMIDEX) 1 MG tablet Take 1 mg by mouth every morning.   Yes Historical Provider, MD  aspirin 325 MG EC tablet Take 325 mg by mouth every other day.   Yes Historical Provider, MD  aspirin EC 81 MG tablet Take 243 mg by mouth every other day.    Yes Historical Provider, MD  cholecalciferol (VITAMIN D) 1000 UNITS tablet Take 1,000 Units by mouth every  morning.    Yes Historical Provider, MD  furosemide (LASIX) 40 MG tablet Take 40 mg by mouth every morning.   Yes Historical Provider, MD  lisinopril (PRINIVIL,ZESTRIL) 20 MG tablet Take 40 mg by mouth daily after breakfast.   Yes Historical Provider, MD  polyvinyl alcohol (LIQUIFILM TEARS) 1.4 % ophthalmic solution Place 2 drops into both  eyes daily as needed for dry eyes.   Yes Historical Provider, MD  traMADol-acetaminophen (ULTRACET) 37.5-325 MG per tablet Take 1 tablet by mouth every 6 (six) hours as needed for pain. 04/17/12  Yes Biagio Borg, MD   Physical Exam: Filed Vitals:   08/12/13 1214 08/12/13 1635  BP: 126/56 172/60  Pulse: 88 101  Temp: 98.5 F (36.9 C) 98 F (36.7 C)  TempSrc: Oral Oral  Resp: 16 21  SpO2: 92% 92%     General:  Obese CF, NAD  Eyes:  PERRL, anicteric, non-injected.  ENT:  Nares clear.  OP clear, non-erythematous without plaques or exudates.  MMM.  Neck:  Supple without TM or JVD.    Lymph:  No cervical, supraclavicular, or submandibular LAD.  Cardiovascular:  RRR, normal S1, S2, without m/r/g.  2+ pulses, warm extremities  Respiratory:  Diminished bilateral BS, no obvious rales, wheezes, or rhonchi, no increased WOB.  Abdomen:  NABS.  Soft, ND/NT.   Skin:  Right labia swollen and protruding about 10cm more than left labia, extremely erythematous, warm, and TTP.  Erythema and swelling track posteriorly to the right buttock.  At the gluteal crease there is a necrotic/ulcerated 3cm area draining mostly bloody discharge.   Musculoskeletal:  Normal bulk and tone.  No LE edema.  Psychiatric:  A & O x 4.  Appropriate affect.  Neurologic:  CN 3-12 intact.  5/5 strength.  Sensation intact.  Labs on Admission:  Basic Metabolic Panel:  Recent Labs Lab 08/12/13 1338  NA 139  K 4.3  CL 102  GLUCOSE 146*  BUN 59*  CREATININE 1.80*   Liver Function Tests: No results found for this basename: AST, ALT, ALKPHOS, BILITOT, PROT, ALBUMIN,  in the  last 168 hours No results found for this basename: LIPASE, AMYLASE,  in the last 168 hours No results found for this basename: AMMONIA,  in the last 168 hours CBC:  Recent Labs Lab 08/12/13 1330 08/12/13 1338  WBC 17.9*  --   NEUTROABS 15.1*  --   HGB 12.7 13.9  HCT 39.5 41.0  MCV 93.6  --   PLT 187  --    Cardiac Enzymes: No results found for this basename: CKTOTAL, CKMB, CKMBINDEX, TROPONINI,  in the last 168 hours  BNP (last 3 results) No results found for this basename: PROBNP,  in the last 8760 hours CBG: No results found for this basename: GLUCAP,  in the last 168 hours  Radiological Exams on Admission: Dg Chest Port 1 View  08/12/2013   CLINICAL DATA:  Pre-op for buttock abscess surgery.  EXAM: PORTABLE CHEST - 1 VIEW  COMPARISON:  DG CHEST 2 VIEW dated 06/07/2013; DG CHEST 2 VIEW dated 06/11/2012; DG CHEST 2 VIEW dated 03/17/2011  FINDINGS: 1618 hr. There are lower lung volumes with increased patchy perihilar opacities in the right lung. The left lung is clear. The heart size and mediastinal contours are stable. There is no pneumothorax or significant pleural effusion. The osseous structures appear unchanged.  IMPRESSION: New patchy perihilar opacities in the right lung, likely atelectasis. No consolidation or significant changes demonstrated.   Electronically Signed   By: Camie Patience M.D.   On: 08/12/2013 16:28    EKG: Independently reviewed.  Sinus tachycardia  Assessment/Plan Principal Problem:   Necrotizing fasciitis of perineum Active Problems:   DIABETES MELLITUS, TYPE II   HYPERLIPIDEMIA   GOUT   ANXIETY   HYPERTENSION   Morbid obesity   Breast cancer, right breast-lobular  s/p lumpectomy and sentinel node biopsy   Wheezing   Leukocytosis   Acute on chronic kidney disease, stage 3  ---  Sepsis due to necrotizing fasciitis of the perineum:   -  Emergent debridement by General Surgery -  Agree with empiric vancomycin and zosyn -  Add clindamycin  -   Await culture data -  Pain control post-operative per surgery -  Continue foley -  Start lovenox post-operatively for DVT prophylaxis -  Back to or wed  Possible CAP, but relatively symptom free.  Findings are more suggestive of atelectasis or possibly early/mild ARDS from severe infection -  Abx as above -  Incentive spirometery -  Early mobilization  Acute on chronic kidney disease, likely prerenal -  D/c lasix and ACEI -  IVF -  Repeat creatinine in AM -  Minimize nephrotoxins and renally dose medications  T2DM, diet controlled -  A1c -  Start low dose SSI for now and adjust as necessary post-operatively  Paroxysmal atrial flutter, asymptomatic -  Telemetry post-operatively -  Managed by Dr. Haroldine Laws, lifelong problem -  Continue ASA  HTN/HLD, elevated likely due to pain -  Hold ACEI and lasix  -  Start hydralazine prn  -  Intolerant of statins  Breast cancer, in remission.  Continue anastrozole  Hx of post-operative DVT -  Start post-operative lovenox  Gout, stable.  Continue allopurinol  Leukocytosis, likely related to [place]. Fas.  Obesity and DOE -  Recommend outpatient sleep study  Diet:  NPO prior to surgery Access:  PIV IVF:  yes Proph:  lovenox  Code Status: full Family Communication: patient and her husband Disposition Plan: Admit to stepdown  Time spent: 60 min Janece Canterbury [facility] Pager [phone]  If 7PM-7AM, please contact night-coverage [email or web address] Password [facility] 08/12/2013, 5:27 PM

## 2013-08-13 ENCOUNTER — Inpatient Hospital Stay (HOSPITAL_COMMUNITY): Payer: Medicare Other

## 2013-08-13 ENCOUNTER — Encounter (HOSPITAL_COMMUNITY): Payer: Self-pay | Admitting: Surgery

## 2013-08-13 DIAGNOSIS — I959 Hypotension, unspecified: Secondary | ICD-10-CM | POA: Diagnosis present

## 2013-08-13 DIAGNOSIS — N183 Chronic kidney disease, stage 3 unspecified: Secondary | ICD-10-CM

## 2013-08-13 DIAGNOSIS — A419 Sepsis, unspecified organism: Principal | ICD-10-CM | POA: Diagnosis not present

## 2013-08-13 DIAGNOSIS — L0390 Cellulitis, unspecified: Secondary | ICD-10-CM

## 2013-08-13 DIAGNOSIS — R6520 Severe sepsis without septic shock: Secondary | ICD-10-CM

## 2013-08-13 DIAGNOSIS — I517 Cardiomegaly: Secondary | ICD-10-CM

## 2013-08-13 DIAGNOSIS — R6521 Severe sepsis with septic shock: Secondary | ICD-10-CM

## 2013-08-13 DIAGNOSIS — M726 Necrotizing fasciitis: Secondary | ICD-10-CM

## 2013-08-13 DIAGNOSIS — N289 Disorder of kidney and ureter, unspecified: Secondary | ICD-10-CM

## 2013-08-13 DIAGNOSIS — R062 Wheezing: Secondary | ICD-10-CM

## 2013-08-13 DIAGNOSIS — L0291 Cutaneous abscess, unspecified: Secondary | ICD-10-CM

## 2013-08-13 LAB — BASIC METABOLIC PANEL
BUN: 53 mg/dL — ABNORMAL HIGH (ref 6–23)
CO2: 24 mEq/L (ref 19–32)
Calcium: 8 mg/dL — ABNORMAL LOW (ref 8.4–10.5)
Chloride: 103 mEq/L (ref 96–112)
Creatinine, Ser: 1.57 mg/dL — ABNORMAL HIGH (ref 0.50–1.10)
GFR calc Af Amer: 35 mL/min — ABNORMAL LOW (ref >90)
GFR calc non Af Amer: 30 mL/min — ABNORMAL LOW (ref >90)
Glucose, Bld: 171 mg/dL — ABNORMAL HIGH (ref 70–99)
Potassium: 4.6 mEq/L (ref 3.7–5.3)
Sodium: 138 mEq/L (ref 137–147)

## 2013-08-13 LAB — LACTIC ACID, PLASMA: Lactic Acid, Venous: 0.9 mmol/L (ref 0.5–2.2)

## 2013-08-13 LAB — HEMOGLOBIN A1C
Hgb A1c MFr Bld: 7.7 % — ABNORMAL HIGH (ref <5.7)
Mean Plasma Glucose: 174 mg/dL — ABNORMAL HIGH (ref <117)

## 2013-08-13 LAB — GLUCOSE, CAPILLARY
Glucose-Capillary: 122 mg/dL — ABNORMAL HIGH (ref 70–99)
Glucose-Capillary: 108 mg/dL — ABNORMAL HIGH (ref 70–99)
Glucose-Capillary: 151 mg/dL — ABNORMAL HIGH (ref 70–99)
Glucose-Capillary: 114 mg/dL — ABNORMAL HIGH (ref 70–99)

## 2013-08-13 LAB — CBC
HCT: 35.7 % — ABNORMAL LOW (ref 36.0–46.0)
Hemoglobin: 11.3 g/dL — ABNORMAL LOW (ref 12.0–15.0)
MCH: 30.2 pg (ref 26.0–34.0)
MCHC: 31.7 g/dL (ref 30.0–36.0)
MCV: 95.5 fL (ref 78.0–100.0)
Platelets: 161 10*3/uL (ref 150–400)
RBC: 3.74 MIL/uL — ABNORMAL LOW (ref 3.87–5.11)
RDW: 15.8 % — ABNORMAL HIGH (ref 11.5–15.5)
WBC: 18.2 10*3/uL — ABNORMAL HIGH (ref 4.0–10.5)

## 2013-08-13 LAB — SODIUM, URINE, RANDOM: Sodium, Ur: 26 mEq/L

## 2013-08-13 LAB — CREATININE, URINE, RANDOM: Creatinine, Urine: 95.6 mg/dL

## 2013-08-13 MED ORDER — SODIUM CHLORIDE 0.9 % IV SOLN
INTRAVENOUS | Status: DC
  Administered 2013-08-13 – 2013-08-18 (×7): via INTRAVENOUS
  Administered 2013-08-19: 100 mL/h via INTRAVENOUS
  Administered 2013-08-19: 22:00:00 via INTRAVENOUS

## 2013-08-13 MED ORDER — SODIUM CHLORIDE 0.9 % IV SOLN
30.0000 ug/min | INTRAVENOUS | Status: DC
Start: 2013-08-13 — End: 2013-08-13
  Administered 2013-08-13 (×2): 80 ug/min via INTRAVENOUS
  Administered 2013-08-13: 30 ug/min via INTRAVENOUS
  Filled 2013-08-13 (×4): qty 1

## 2013-08-13 MED ORDER — SODIUM CHLORIDE 0.9 % IV BOLUS (SEPSIS)
1000.0000 mL | Freq: Once | INTRAVENOUS | Status: AC
  Administered 2013-08-13: 1000 mL via INTRAVENOUS

## 2013-08-13 MED ORDER — SODIUM CHLORIDE 0.9 % IV SOLN
30.0000 ug/min | INTRAVENOUS | Status: DC
  Administered 2013-08-13: 75 ug/min via INTRAVENOUS
  Administered 2013-08-14: 88 ug/min via INTRAVENOUS
  Filled 2013-08-13 (×3): qty 4

## 2013-08-13 MED ORDER — SODIUM CHLORIDE 0.9 % IV SOLN
30.0000 ug/min | INTRAVENOUS | Status: DC
  Administered 2013-08-13: 80 ug/min via INTRAVENOUS
  Filled 2013-08-13 (×2): qty 2

## 2013-08-13 MED ORDER — BOOST / RESOURCE BREEZE PO LIQD: 1.0000 | Freq: Three times a day (TID) | ORAL | Status: DC

## 2013-08-13 MED ORDER — SODIUM CHLORIDE 0.9 % IV BOLUS (SEPSIS): 500.0000 mL | Freq: Once | INTRAVENOUS | Status: DC

## 2013-08-13 MED ORDER — JUVEN PO PACK
1.0000 | PACK | Freq: Two times a day (BID) | ORAL | Status: DC
  Administered 2013-08-13: 1 via ORAL
  Filled 2013-08-13 (×3): qty 1

## 2013-08-13 MED ORDER — VANCOMYCIN HCL 10 G IV SOLR
1500.0000 mg | INTRAVENOUS | Status: DC
  Administered 2013-08-13 – 2013-08-14 (×2): 1500 mg via INTRAVENOUS
  Filled 2013-08-13 (×3): qty 1500

## 2013-08-13 MED ORDER — SODIUM CHLORIDE 0.9 % IV BOLUS (SEPSIS)
500.0000 mL | Freq: Once | INTRAVENOUS | Status: AC
  Administered 2013-08-13: 500 mL via INTRAVENOUS

## 2013-08-13 MED ORDER — SODIUM CHLORIDE 0.9 % IV SOLN
INTRAVENOUS | Status: DC
  Administered 2013-08-13: 19:00:00 via INTRAVENOUS
  Administered 2013-08-16: 20 mL/h via INTRAVENOUS
  Administered 2013-08-23: 20 mL via INTRAVENOUS

## 2013-08-13 MED ORDER — SODIUM CHLORIDE 0.9 % IV SOLN
INTRAVENOUS | Status: DC
  Administered 2013-08-13: 23:00:00 via INTRAVENOUS

## 2013-08-13 MED ORDER — SODIUM CHLORIDE 0.9 % IV SOLN
2.0000 ug/min | INTRAVENOUS | Status: DC
  Filled 2013-08-13: qty 4

## 2013-08-13 NOTE — Progress Notes (Signed)
Pt with b/p of 60/25 midlevel paged awaiting call back.

## 2013-08-13 NOTE — Procedures (Signed)
Arterial Catheter Insertion Procedure Note [name] [id number] 12/28/1929  Procedure: Insertion of Arterial Catheter  Indications: Blood pressure monitoring  Procedure Details Consent: Risks of procedure as well as the alternatives and risks of each were explained to the (patient/caregiver).  Consent for procedure obtained. and Unable to obtain consent because of emergent medical necessity. Time Out: Verified patient identification, verified procedure, site/side was marked, verified correct patient position, special equipment/implants available, medications/allergies/relevent history reviewed, required imaging and test results available.  Performed  Maximum sterile technique was used including antiseptics, cap, gloves, gown, hand hygiene, mask and sheet. Skin prep: Chlorhexidine; local anesthetic administered 20 gauge catheter was inserted into left radial artery using the Seldinger technique.  Evaluation Blood flow good; BP tracing good. Complications: No apparent complications.   Duanne Guess [place] 08/13/2013

## 2013-08-13 NOTE — Procedures (Signed)
Central Venous Catheter Insertion Procedure Note KASHIYA MEEUWSEN [id number] 1930/02/24  Procedure: Insertion of Central Venous Catheter Indications: Assessment of intravascular volume, Drug and/or fluid administration and Frequent blood sampling  Procedure Details Consent: Risks of procedure as well as the alternatives and risks of each were explained to the (patient/caregiver).  Consent for procedure obtained. Time Out: Verified patient identification, verified procedure, site/side was marked, verified correct patient position, special equipment/implants available, medications/allergies/relevent history reviewed, required imaging and test results available.  Performed  Maximum sterile technique was used including antiseptics, cap, gloves, gown, hand hygiene, mask and sheet. Skin prep: Chlorhexidine; local anesthetic administered A antimicrobial bonded/coated triple lumen catheter was placed in the left internal jugular vein using the Seldinger technique. Ultrasound guidance used.yes Catheter placed to 20 cm. Blood aspirated via all 3 ports and then flushed x 3. Line sutured x 2 and dressing applied.  Evaluation Blood flow good Complications: No apparent complications Patient did tolerate procedure well. Chest X-ray ordered to verify placement.  CXR: pending.  Richardson Landry Minor ACNP Maryanna Shape PCCM Pager [phone] till 3 pm If no answer page [phone] 08/13/2013, 8:32 AM   Baltazar Apo, MD, PhD 08/13/2013, 9:11 AM [facility] [phone] or if no answer [phone]

## 2013-08-13 NOTE — Progress Notes (Signed)
INITIAL NUTRITION ASSESSMENT  DOCUMENTATION CODES Per approved criteria  -Morbid Obesity   INTERVENTION: - Diet advancement per MD - Resource Breeze TID - Juven 1 packet BID - Will continue to monitor   NUTRITION DIAGNOSIS: Inadequate oral intake related to clear liquid diet as evidenced by diet order.   Goal: Advance diet as diabetic diet   Monitor:  Weights, labs, diet advancement   Reason for Assessment: Malnutrition screening tool   80 y.o. female  Admitting Dx: Necrotizing fasciitis  ASSESSMENT: Admitted with abscess on buttocks x 3 days, found to have necrotizing fascitis of the perineum. Pt with history of paroxysmal atrial flutter, T2DM, morbid obesity, CVA, GERD, post-operative DVT, CKD, breast CA, anxiety, and anemia. Had incision and drainage of abscess yesterday.   Met with pt and family who report pt has been hardly eating anything for the past 3 days. States before then she was eating 2 meals/day and some snacks and reports her meals were well balanced. Denies any nausea or diarrhea today, had both yesterday. Denies any changes in weight.   BUN/Cr elevated with low GFR   Height: Ht Readings from Last 1 Encounters:  08/12/13 _0  (1.651 m)    Weight: Wt Readings from Last 1 Encounters:  08/13/13 254 lb 3.1 oz (115.3 kg)    Ideal Body Weight: 125 lb   % Ideal Body Weight: 203%  Wt Readings from Last 10 Encounters:  08/13/13 254 lb 3.1 oz (115.3 kg)  08/13/13 254 lb 3.1 oz (115.3 kg)  06/07/13 251 lb (113.853 kg)  04/30/13 244 lb (110.678 kg)  01/29/13 257 lb 6 oz (116.745 kg)  10/31/12 246 lb (111.585 kg)  07/31/12 248 lb 9.6 oz (112.764 kg)  06/27/12 246 lb (111.585 kg)  06/11/12 242 lb (109.77 kg)  05/01/12 249 lb 8 oz (113.172 kg)    Usual Body Weight: 251 lb in December 2014  % Usual Body Weight: 101%  BMI:  Body mass index is 42.3 kg/(m^2). Class III extreme obesity  Estimated Nutritional Needs: Kcal: 1700-1900 Protein:  85-100g Fluid: 1.7-1.9L/day  Skin: Generalized edema, perineum incision, right buttocks incision   Diet Order: Clear Liquid  EDUCATION NEEDS: -No education needs identified at this time   Intake/Output Summary (Last 24 hours) at 08/13/13 1418 Last data filed at 08/13/13 1233  Gross per 24 hour  Intake 4523.83 ml  Output    970 ml  Net 3553.83 ml    Last BM: PTA  Labs:   Recent Labs Lab 08/12/13 1338 08/13/13 0310  NA 139 138  K 4.3 4.6  CL 102 103  CO2  --  24  BUN 59* 53*  CREATININE 1.80* 1.57*  CALCIUM  --  8.0*  GLUCOSE 146* 171*    CBG (last 3)   Recent Labs  08/12/13 2202 08/13/13 0739 08/13/13 1141  GLUCAP 158* 122* 108*    Scheduled Meds: . [START ON 08/14/2013] allopurinol  100 mg Oral QODAY  . anastrozole  1 mg Oral q morning - 10a  . [START ON 08/14/2013] aspirin EC  243 mg Oral QODAY  . aspirin EC  325 mg Oral QODAY  . cholecalciferol  1,000 Units Oral q morning - 10a  . clindamycin (CLEOCIN) IV  600 mg Intravenous 3 times per day  . enoxaparin (LOVENOX) injection  30 mg Subcutaneous Q24H  . insulin aspart  0-9 Units Subcutaneous TID WC  . piperacillin-tazobactam (ZOSYN)  IV  3.375 g Intravenous Q8H  . sodium chloride  3 mL  Intravenous Q12H  . vancomycin  1,500 mg Intravenous Q24H    Continuous Infusions: . sodium chloride 100 mL/hr at 08/13/13 0417  . phenylephrine (NEO-SYNEPHRINE) Adult infusion      Past Medical History  Diagnosis Date  . Hypertension   . Hyperlipidemia   . Morbid obesity   . Osteoarthritis   . Gout   . CVA (cerebral infarction)   . IBS (irritable bowel syndrome)   . Diverticulosis   . Esophageal stricture   . Colon polyps     hyperplastic  . Anxiety   . Hiatal hernia     4 cm  . Shingles 02/21/2011  . Skin cancer   . Breast cancer     right breast  . Dysrhythmia     dr bensimhon   . Blood transfusion   . Chronic kidney disease     occ uti's  . GERD (gastroesophageal reflux disease)   . PONV  (postoperative nausea and vomiting)   . DVT of leg (deep venous thrombosis)     left; S/P OR  . Atrial flutter     "sometimes"  . Bronchitis   . Pneumonia     "walking"  . Shortness of breath on exertion   . Anemia   . Stroke 2002    residual:  "little tingling in palm of left hand"  . Kidney stones 1990's  . UTI (lower urinary tract infection)     "I've had a few"  . History of radiation therapy 02/15/12-04/02/12    right breast 4680 cGy/26 sessions, right boost=1400cGy /7 sessions    Past Surgical History  Procedure Laterality Date  . Abdominal hysterectomy    . Bile duct stent placement  ~ 01/2011  . Pelvic bone tumor removal      twice in 2000's  . Colonic perforation repair  ~ 2000  . Total hip arthroplasty  1980's    right  . Joint replacement    . Colon surgery      hole in intestine ,tumors?  . Tonsillectomy  ~ 1946  . Cholecystectomy  03/23/11    lap chole   . Replacement total knee bilateral  ~ 2008; ~ 2010    right; left  . Cataract extraction w/ intraocular lens  implant, bilateral  1990's  . Hemorrhoid surgery  2012  . Breast lumpectomy  06/29/11    right  . Breast lumpectomy  06/29/2011    Procedure: BREAST LUMPECTOMY WITH EXCISION OF SENTINEL NODE;  Surgeon: Pedro Earls, MD;  Location: [place];  Service: General;  Laterality: Right;  right sentinel node mapping,right sentinel node biopsy, needle localization right breast lumpectomy  . Incision and drainage breast abscess      right breast seroma  . Incision and drainage abscess N/A 08/12/2013    Procedure: INCISION AND DRAINAGE ABSCESS;  Surgeon: Earnstine Regal, MD;  Location: [facility];  Service: General;  Laterality: N/A;    Mikey College MS, [place], [place] Pager [phone] After Hours Pager

## 2013-08-13 NOTE — Progress Notes (Signed)
ANTIBIOTIC CONSULT NOTE - Follow Up  Pharmacy Consult for vancomycin Indication: abscess on buttock  Allergies  Allergen Reactions  . Ace Inhibitors Other (See Comments)    Tolerates lisinopril at home. Pt does not recall allergy.  . Atorvastatin     REACTION: myalgias  . Oxycodone-Acetaminophen     REACTION: confusion, fatigue  . Rosuvastatin     REACTION: leg weakness  . Simvastatin     REACTION: leg weakness  . Codeine Nausea And Vomiting and Rash  . Sulfonamide Derivatives Hives and Rash    Patient Measurements: Height: 5\' 5"  (165.1 cm) Weight: 254 lb 3.1 oz (115.3 kg) IBW/kg (Calculated) : 57   Vital Signs: Temp: 98.1 [name] (36.7 C) (03/03 0800) Temp src: Oral (03/03 0800) BP: 111/49 mmHg (03/03 0630) Pulse Rate: 64 (03/03 0630) Intake/Output from previous day: 03/02 0701 - 03/03 0700 In: 3027.8 [I.V.:1294.5; IV Piggyback:1733.3] Out: 690 [Urine:690] Intake/Output from this shift:    Labs:  Recent Labs  08/12/13 1330 08/12/13 1338 08/13/13 0310  WBC 17.9*  --  18.2*  HGB 12.7 13.9 11.3*  PLT 187  --  161  CREATININE  --  1.80* 1.57*  Estimated CrCl ~ 28 mL/min/72 kg (wt-normalized)   Estimated Creatinine Clearance: 35.6 ml/min (by C-G formula based on Cr of 1.57). No results found for this basename: VANCOTROUGH, Corlis Leak, VANCORANDOM, GENTTROUGH, GENTPEAK, GENTRANDOM, TOBRATROUGH, TOBRAPEAK, TOBRARND, AMIKACINPEAK, AMIKACINTROU, AMIKACIN,  in the last 72 hours   Microbiology: Recent Results (from the past 720 hour(s))  ANAEROBIC CULTURE     Status: None   Collection Time    08/12/13  6:00 PM      Result Value Ref Range Status   Specimen Description BUTTOCKS PERINEAL ABSCESS   Final   Special Requests NONE   Final   Gram Stain     Final   Value: RARE WBC PRESENT, PREDOMINANTLY PMN     NO SQUAMOUS EPITHELIAL CELLS SEEN     MODERATE GRAM POSITIVE COCCI IN PAIRS     FEW GRAM NEGATIVE RODS     Performed at [facility]   Culture PENDING    Incomplete   Report Status PENDING   Incomplete  CULTURE, ROUTINE-ABSCESS     Status: None   Collection Time    08/12/13  6:00 PM      Result Value Ref Range Status   Specimen Description BUTTOCKS PERINEAL ABSCESS   Final   Special Requests NONE   Final   Gram Stain     Final   Value: RARE WBC PRESENT, PREDOMINANTLY PMN     NO SQUAMOUS EPITHELIAL CELLS SEEN     MODERATE GRAM POSITIVE COCCI IN PAIRS     FEW GRAM NEGATIVE RODS     Performed at [facility]   Culture PENDING   Incomplete   Report Status PENDING   Incomplete  MRSA PCR SCREENING     Status: None   Collection Time    08/12/13  8:00 PM      Result Value Ref Range Status   MRSA by PCR NEGATIVE  NEGATIVE Final   Comment:            The GeneXpert MRSA Assay (FDA     approved for NASAL specimens     only), is one component of a     comprehensive MRSA colonization     surveillance program. It is not     intended to diagnose MRSA     infection nor to guide  or     monitor treatment for     MRSA infections.    Assessment: [name] presented to ED with 3 day history of abscess on L buttock along with generalized weakness.  Pharmacy consulted for Vancomycin and Zosyn management.  D#2 vancomycin 2g x 1 then 1500 mg q48h, Zosyn 3.375g IV q8h (4 hour infusion time), Clindamycin 600 mg IV q8h (per MD) for septic shock 2/2 buttock abscess, necrotizing fasciitis s/p debridement and drainage of right buttock and right labia.   Tmax: 101.1 WBCs: 18.2 Renal: SCr 1.8 > 1.57, CrCl 31 ml/min (normalized)  Microbiology data: 3/2: abscess cx: pending, gram stain showing mod GPC in pairs and few GNR  Goal of Therapy:  Appropriate antibiotic dosing for weight and renal function; eradication of infection Vancomycin trough 15-20  Plan:  1. Adjust vancomycin to 1500 mg IV q24h for improved CrCl. 2. No dose adjustments for Zosyn or Clinda. 3. Follow serum creatinine, vancomycin levels, cultures, clinical course.   [name], PharmD, BCPS Pager: [phone] 08/13/2013 10:38 AM       [name], [name] 08/13/2013,10:35 AM

## 2013-08-13 NOTE — Progress Notes (Signed)
Patient ID: [name], female   DOB: [birth year], 80 y.o.   MRN: [id number]  General Surgery - [facility] - Progress Note  POD# 1  Subjective: Patient alert and responsive.  Hemodynamic instability overnight not surprising.  Mild pain - better than last night.  No abdominal pain.  Objective: Vital signs in last 24 hours: Temp:  [98 F (36.7 C)-101.3 F (38.5 C)] 98 F (36.7 C) (03/03 0400) Pulse Rate:  [64-102] 64 (03/03 0630) Resp:  [15-29] 17 (03/03 0630) BP: (60-172)/(19-98) 111/49 mmHg (03/03 0630) SpO2:  [86 %-100 %] 97 % (03/03 0630) Arterial Line BP: (132-151)/(48-58) 132/48 mmHg (03/03 0650) Weight:  [250 lb (113.399 kg)-254 lb 3.1 oz (115.3 kg)] 254 lb 3.1 oz (115.3 kg) (03/03 0400)    Intake/Output from previous day: 03/02 0701 - 03/03 0700 In: 3027.8 [I.V.:1294.5; IV Piggyback:1733.3] Out: 690 [Urine:690]  Exam: HEENT - clear, not icteric Neck - soft Chest - clear bilaterally Cor - RRR, rate 60's Abd - soft, non-tender GU - dressing intact with betadine staining, no bleeding noted Ext - no significant edema Neuro - grossly intact, no focal deficits  Lab Results:   Recent Labs  08/12/13 1330 08/12/13 1338 08/13/13 0310  WBC 17.9*  --  18.2*  HGB 12.7 13.9 11.3*  HCT 39.5 41.0 35.7*  PLT 187  --  161     Recent Labs  08/12/13 1338 08/13/13 0310  NA 139 138  K 4.3 4.6  CL 102 103  CO2  --  24  GLUCOSE 146* 171*  BUN 59* 53*  CREATININE 1.80* 1.57*  CALCIUM  --  8.0*    Studies/Results: Dg Chest Port 1 View  08/12/2013   CLINICAL DATA:  Pre-op for buttock abscess surgery.  EXAM: PORTABLE CHEST - 1 VIEW  COMPARISON:  DG CHEST 2 VIEW dated 06/07/2013; DG CHEST 2 VIEW dated 06/11/2012; DG CHEST 2 VIEW dated 03/17/2011  FINDINGS: 1618 hr. There are lower lung volumes with increased patchy perihilar opacities in the right lung. The left lung is clear. The heart size and mediastinal contours are stable. There is no pneumothorax  or significant pleural effusion. The osseous structures appear unchanged.  IMPRESSION: New patchy perihilar opacities in the right lung, likely atelectasis. No consolidation or significant changes demonstrated.   Electronically Signed   By: Camie Patience M.D.   On: 08/12/2013 16:28    Assessment / Plan: 1.  Necrotizing fasciitis of perineum  WBC up to 18K post op  On IV Vanco, Zosyn, Clinda per medical service  Gram stain with GPC's in pairs  Plan return to OR for dressing change and further debridement on Wednesday 3/4  Appreciate medical service and CCM management.  Earnstine Regal, MD, [facility] Office: [phone]  08/13/2013

## 2013-08-13 NOTE — Progress Notes (Signed)
Echocardiogram 2D Echocardiogram has been performed.  Joelene Millin 08/13/2013, 2:56 PM

## 2013-08-13 NOTE — Progress Notes (Signed)
Critical care in to see pt.

## 2013-08-13 NOTE — Progress Notes (Signed)
[facility] PROGRESS NOTE  [name] [id number] DOB: [birth year] DOA: 08/12/2013 PCP: [name], MD  Assessment/Plan  Septic shock secondary to necrotizing fasciitis of the perineum - IVF 4L overnight, but hypotensive - Started phenylephrine - PCCM consulted:  Placing a-line and CVC  - Emergent debridement by General Surgery 3/2 - Back to OR on Wed or sooner if needed - Continue vancomycin, zosyn, and clindamycin day 2, await cultures - Await culture data:  moderate GPC in pairs - Pain control post-operative per surgery  - Continue foley - Consider rectal tube - Start lovenox post-operatively for DVT prophylaxis   Acute hypoxic resp failure with increasing FIO2 requirements, currently on 6L.  Possible CAP, likely ARDS from severe infection, wheezing this AM -  F/u repeat CXR after CVC placement  - Abx as above  - Incentive spirometery  - Early mobilization   Acute on chronic kidney disease, likely prerenal, improving with aggressive hydration - Hold lasix and ACEI  - Continue IVF  - Minimize nephrotoxins and renally dose medications   T2DM, diet controlled, goal CBG 140-180 in critically ill patient or possibly mildly lower if possible  - A1c 7.7 - Continue low dose SSI  Paroxysmal atrial flutter, asymptomatic  - Telemetry  - Managed by Dr. Haroldine Laws, lifelong problem  - Continue ASA unless surgery feels that this complicates their procedures  HTN/HLD, elevated likely due to pain  - Hold ACEI and lasix  - Start hydralazine prn  - Intolerant of statins   Breast cancer, in remission. Continue anastrozole   Hx of post-operative DVT  - Start post-operative lovenox   Gout, stable. Continue allopurinol   Leukocytosis, likely related to [place]. Fas.   Obesity and DOE  - Recommend outpatient sleep study  Diet:  CLD Access:  PIV, CVC being placed IVF:  yes Proph:  lovenox  Code Status: Full Family Communication: patient and son Disposition Plan:  ICU  status, possibly starting additional BP support, increasing FIO2 requirement.  Transfer to PCCM service until more medically stable.    Consultants:  PCCM  General Surgery   Procedures:  Debridement of right perineum by Dr. Harlow Asa on 3/2  CVC 3/3  Antibiotics:  Vancomycin 3/2 >  Zosyn 3/2 >  Clinda 3/2 >   HPI/Subjective:  Feels wheezy, but denies SOB, cough, nausea, vomiting.  No BMs so far.  Had some back pain that improved with repositioning.  Perineum sore, but otherwise okay.  Febrile and hypotensive last night requiring vasopressors and increasing FIO2.    Objective: Filed Vitals:   08/13/13 0620 08/13/13 0625 08/13/13 0630 08/13/13 0800  BP: 91/19  111/49   Pulse: 66 69 64   Temp:    98.1 F (36.7 C)  TempSrc:    Oral  Resp: 18 20 17    Height:      Weight:      SpO2: 96% 98% 97%     Intake/Output Summary (Last 24 hours) at 08/13/13 0915 Last data filed at 08/13/13 0600  Gross per 24 hour  Intake 3027.83 ml  Output    690 ml  Net 2337.83 ml   Filed Weights   08/12/13 2120 08/13/13 0400  Weight: 113.399 kg (250 lb) 115.3 kg (254 lb 3.1 oz)    Exam:   General:  Obese CF, No acute distress  HEENT:  NCAT, MMM  Cardiovascular:  RRR, nl S1, S2 no mrg, 2+ pulses, warm extremities  Respiratory:  Diminished with high pitched wheeze bilaterally, no obvious rales  or rhonchi, no increased WOB  Abdomen:   NABS, soft, mild TTP inferior quadrants  MSK:   Normal tone and bulk, no LEE  Neuro:  Grossly intact  GU:  Betadine packing right perineum.  Mild erythema and swelling of left labia this morning extending to mons pubis left side  Data Reviewed: Basic Metabolic Panel:  Recent Labs Lab 08/12/13 1338 08/13/13 0310  NA 139 138  K 4.3 4.6  CL 102 103  CO2  --  24  GLUCOSE 146* 171*  BUN 59* 53*  CREATININE 1.80* 1.57*  CALCIUM  --  8.0*   Liver Function Tests: No results found for this basename: AST, ALT, ALKPHOS, BILITOT, PROT, ALBUMIN,   in the last 168 hours No results found for this basename: LIPASE, AMYLASE,  in the last 168 hours No results found for this basename: AMMONIA,  in the last 168 hours CBC:  Recent Labs Lab 08/12/13 1330 08/12/13 1338 08/13/13 0310  WBC 17.9*  --  18.2*  NEUTROABS 15.1*  --   --   HGB 12.7 13.9 11.3*  HCT 39.5 41.0 35.7*  MCV 93.6  --  95.5  PLT 187  --  161   Cardiac Enzymes: No results found for this basename: CKTOTAL, CKMB, CKMBINDEX, TROPONINI,  in the last 168 hours BNP (last 3 results) No results found for this basename: PROBNP,  in the last 8760 hours CBG:  Recent Labs Lab 08/12/13 1838 08/12/13 2202 08/13/13 0739  GLUCAP 112* 158* 122*    Recent Results (from the past 240 hour(s))  ANAEROBIC CULTURE     Status: None   Collection Time    08/12/13  6:00 PM      Result Value Ref Range Status   Specimen Description BUTTOCKS PERINEAL ABSCESS   Final   Special Requests NONE   Final   Gram Stain     Final   Value: RARE WBC PRESENT, PREDOMINANTLY PMN     NO SQUAMOUS EPITHELIAL CELLS SEEN     MODERATE GRAM POSITIVE COCCI IN PAIRS     FEW GRAM NEGATIVE RODS     Performed at [facility]   Culture PENDING   Incomplete   Report Status PENDING   Incomplete  CULTURE, ROUTINE-ABSCESS     Status: None   Collection Time    08/12/13  6:00 PM      Result Value Ref Range Status   Specimen Description BUTTOCKS PERINEAL ABSCESS   Final   Special Requests NONE   Final   Gram Stain     Final   Value: RARE WBC PRESENT, PREDOMINANTLY PMN     NO SQUAMOUS EPITHELIAL CELLS SEEN     MODERATE GRAM POSITIVE COCCI IN PAIRS     FEW GRAM NEGATIVE RODS     Performed at [facility]   Culture PENDING   Incomplete   Report Status PENDING   Incomplete  MRSA PCR SCREENING     Status: None   Collection Time    08/12/13  8:00 PM      Result Value Ref Range Status   MRSA by PCR NEGATIVE  NEGATIVE Final   Comment:            The GeneXpert MRSA Assay (FDA     approved for  NASAL specimens     only), is one component of a     comprehensive MRSA colonization     surveillance program. It is not     intended to  diagnose MRSA     infection nor to guide or     monitor treatment for     MRSA infections.     Studies: Dg Chest Port 1 View  08/12/2013   CLINICAL DATA:  Pre-op for buttock abscess surgery.  EXAM: PORTABLE CHEST - 1 VIEW  COMPARISON:  DG CHEST 2 VIEW dated 06/07/2013; DG CHEST 2 VIEW dated 06/11/2012; DG CHEST 2 VIEW dated 03/17/2011  FINDINGS: 1618 hr. There are lower lung volumes with increased patchy perihilar opacities in the right lung. The left lung is clear. The heart size and mediastinal contours are stable. There is no pneumothorax or significant pleural effusion. The osseous structures appear unchanged.  IMPRESSION: New patchy perihilar opacities in the right lung, likely atelectasis. No consolidation or significant changes demonstrated.   Electronically Signed   By: Camie Patience M.D.   On: 08/12/2013 16:28    Scheduled Meds: . [START ON 08/14/2013] allopurinol  100 mg Oral QODAY  . anastrozole  1 mg Oral q morning - 10a  . [START ON 08/14/2013] aspirin EC  243 mg Oral QODAY  . aspirin EC  325 mg Oral QODAY  . cholecalciferol  1,000 Units Oral q morning - 10a  . clindamycin (CLEOCIN) IV  600 mg Intravenous 3 times per day  . enoxaparin (LOVENOX) injection  30 mg Subcutaneous Q24H  . insulin aspart  0-9 Units Subcutaneous TID WC  . piperacillin-tazobactam (ZOSYN)  IV  3.375 g Intravenous Q8H  . sodium chloride  3 mL Intravenous Q12H  . [START ON 08/14/2013] vancomycin  1,500 mg Intravenous Q48H   Continuous Infusions: . sodium chloride 100 mL/hr at 08/13/13 0417  . phenylephrine (NEO-SYNEPHRINE) Adult infusion 80 mcg/min (08/13/13 0740)    Principal Problem:   Necrotizing fasciitis of perineum Active Problems:   DIABETES MELLITUS, TYPE II   HYPERLIPIDEMIA   GOUT   ANXIETY   HYPERTENSION   Morbid obesity   Breast cancer, right  breast-lobular s/p lumpectomy and sentinel node biopsy   Wheezing   Leukocytosis   Acute on chronic kidney disease, stage 3   Hypotension, unspecified   Septic shock(785.52)    Time spent: 30 min    Harl Wiechmann, [facility] Pager [phone]. If 7PM-7AM, please contact night-coverage at [email or web address], password [facility] 08/13/2013, 9:15 AM  LOS: 1 day

## 2013-08-13 NOTE — Consult Note (Signed)
Name: [name] MRN: [id number] DOB: November 30, 1929    ADMISSION DATE:  08/12/2013 CONSULTATION DATE:  3/3  REFERRING MD :  [facility] PRIMARY SERVICE: [facility]  CHIEF COMPLAINT:  Hypotension  BRIEF PATIENT DESCRIPTION:  80 yo obese female (250 lbs) with history of breast cancer, PAF,CVA, DM,GERD, DVT. S/p exploration and debridement R labial necrotizing fasciitis. To ICU 3/3 with septic shock.  SIGNIFICANT EVENTS / STUDIES:    LINES / TUBES: 3/3  Left rad a line>> 3/3 lt i j cvl>>  CULTURES: 3/2 wound>>gpc>>  ANTIBIOTICS: 3/2 vanc>> 3/2 pip-tazo>> 3/2 Clinda>>  HISTORY OF PRESENT ILLNESS:   80 yo obese female (250 lbs) with history of breast cancer, PAF,CVA, DM,GERD, DVT who presented 3/2 with rt labia soreness  and raised area. She was taken to OR(3/3) by surgery for exploration and debridement with findings consistent with necrotizing fascitis with CPC. She has become hypotensive post 3/3 and is at risk for sepsis therefore PCCM has been consulted for presumed shock 3/3.  PAST MEDICAL HISTORY :  Past Medical History  Diagnosis Date  . Hypertension   . Hyperlipidemia   . Morbid obesity   . Osteoarthritis   . Gout   . CVA (cerebral infarction)   . IBS (irritable bowel syndrome)   . Diverticulosis   . Esophageal stricture   . Colon polyps     hyperplastic  . Anxiety   . Hiatal hernia     4 cm  . Shingles 02/21/2011  . Skin cancer   . Breast cancer     right breast  . Dysrhythmia     dr bensimhon   . Blood transfusion   . Chronic kidney disease     occ uti's  . GERD (gastroesophageal reflux disease)   . PONV (postoperative nausea and vomiting)   . DVT of leg (deep venous thrombosis)     left; S/P OR  . Atrial flutter     "sometimes"  . Bronchitis   . Pneumonia     "walking"  . Shortness of breath on exertion   . Anemia   . Stroke 2002    residual:  "little tingling in palm of left hand"  . Kidney stones 1990's  . UTI (lower urinary tract infection)       "I've had a few"  . History of radiation therapy 02/15/12-04/02/12    right breast 4680 cGy/26 sessions, right boost=1400cGy /7 sessions   Past Surgical History  Procedure Laterality Date  . Abdominal hysterectomy    . Bile duct stent placement  ~ 01/2011  . Pelvic bone tumor removal      twice in 2000's  . Colonic perforation repair  ~ 2000  . Total hip arthroplasty  1980's    right  . Joint replacement    . Colon surgery      hole in intestine ,tumors?  . Tonsillectomy  ~ 1946  . Cholecystectomy  03/23/11    lap chole   . Replacement total knee bilateral  ~ 2008; ~ 2010    right; left  . Cataract extraction w/ intraocular lens  implant, bilateral  1990's  . Hemorrhoid surgery  2012  . Breast lumpectomy  06/29/11    right  . Breast lumpectomy  06/29/2011    Procedure: BREAST LUMPECTOMY WITH EXCISION OF SENTINEL NODE;  Surgeon: Pedro Earls, MD;  Location: [place];  Service: General;  Laterality: Right;  right sentinel node mapping,right sentinel node biopsy, needle localization right breast lumpectomy  .  Incision and drainage breast abscess      right breast seroma    Prior to Admission medications   Medication Sig Start Date End Date Taking? Authorizing Provider  allopurinol (ZYLOPRIM) 100 MG tablet Take 100 mg by mouth every other day.   Yes Historical Provider, MD  anastrozole (ARIMIDEX) 1 MG tablet Take 1 mg by mouth every morning.   Yes Historical Provider, MD  aspirin 325 MG EC tablet Take 325 mg by mouth every other day.   Yes Historical Provider, MD  aspirin EC 81 MG tablet Take 243 mg by mouth every other day.    Yes Historical Provider, MD  cholecalciferol (VITAMIN D) 1000 UNITS tablet Take 1,000 Units by mouth every morning.    Yes Historical Provider, MD  furosemide (LASIX) 40 MG tablet Take 40 mg by mouth every morning.   Yes Historical Provider, MD  lisinopril (PRINIVIL,ZESTRIL) 20 MG tablet Take 40 mg by mouth daily after breakfast.   Yes Historical Provider,  MD  polyvinyl alcohol (LIQUIFILM TEARS) 1.4 % ophthalmic solution Place 2 drops into both eyes daily as needed for dry eyes.   Yes Historical Provider, MD  traMADol-acetaminophen (ULTRACET) 37.5-325 MG per tablet Take 1 tablet by mouth every 6 (six) hours as needed for pain. 04/17/12  Yes Biagio Borg, MD    Allergies  Allergen Reactions  . Ace Inhibitors Other (See Comments)    Tolerates lisinopril at home. Pt does not recall allergy.  . Atorvastatin     REACTION: myalgias  . Oxycodone-Acetaminophen     REACTION: confusion, fatigue  . Rosuvastatin     REACTION: leg weakness  . Simvastatin     REACTION: leg weakness  . Codeine Nausea And Vomiting and Rash  . Sulfonamide Derivatives Hives and Rash    FAMILY HISTORY:  Family History  Problem Relation Age of Onset  . Breast cancer Sister     x 2  . Diabetes Sister   . Prostate cancer Brother   . Colon cancer Neg Hx   . Stroke Mother   . Kidney failure Sister   . Heart disease Paternal Uncle     multiple  . Heart disease Maternal Uncle     multiple   SOCIAL HISTORY:  reports that she has never smoked. She has never used smokeless tobacco. She reports that she does not drink alcohol or use illicit drugs.  REVIEW OF SYSTEMS:   10 point review of system taken, please see HPI for positives and negatives.  SUBJECTIVE:   VITAL SIGNS: Temp:  [98 F (36.7 C)-101.3 F (38.5 C)] 98 F (36.7 C) (03/03 0400) Pulse Rate:  [64-102] 64 (03/03 0630) Resp:  [15-29] 17 (03/03 0630) BP: (60-172)/(19-98) 111/49 mmHg (03/03 0630) SpO2:  [86 %-100 %] 97 % (03/03 0630) Arterial Line BP: (132-151)/(48-58) 132/48 mmHg (03/03 0650) Weight:  [113.399 kg (250 lb)-115.3 kg (254 lb 3.1 oz)] 115.3 kg (254 lb 3.1 oz) (03/03 0400) HEMODYNAMICS:   VENTILATOR SETTINGS:   INTAKE / OUTPUT: Intake/Output     03/02 0701 - 03/03 0700 03/03 0701 - 03/04 0700   I.V. (mL/kg) 1294.5 (11.2)    IV Piggyback 1733.3    Total Intake(mL/kg) 3027.8 (26.3)     Urine (mL/kg/hr) 690    Total Output 690     Net +2337.8            PHYSICAL EXAMINATION: General:  MOWF Neuro:  Intact HEENT:  Short neck, No JVD/LAN Cardiovascular:  HSR Lungs:  Decreased  thru out  Abdomen:  Obese +bs Musculoskeletal:  intact Skin:  Labial dressing CDI  LABS:  CBC  Recent Labs Lab 08/12/13 1330 08/12/13 1338 08/13/13 0310  WBC 17.9*  --  18.2*  HGB 12.7 13.9 11.3*  HCT 39.5 41.0 35.7*  PLT 187  --  161   Coag's No results found for this basename: APTT, INR,  in the last 168 hours BMET  Recent Labs Lab 08/12/13 1338 08/13/13 0310  NA 139 138  K 4.3 4.6  CL 102 103  CO2  --  24  BUN 59* 53*  CREATININE 1.80* 1.57*  GLUCOSE 146* 171*   Electrolytes  Recent Labs Lab 08/13/13 0310  CALCIUM 8.0*   Sepsis Markers  Recent Labs Lab 08/13/13 0315  LATICACIDVEN 0.9   ABG No results found for this basename: PHART, PCO2ART, PO2ART,  in the last 168 hours Liver Enzymes No results found for this basename: AST, ALT, ALKPHOS, BILITOT, ALBUMIN,  in the last 168 hours Cardiac Enzymes No results found for this basename: TROPONINI, PROBNP,  in the last 168 hours Glucose  Recent Labs Lab 08/12/13 1838 08/12/13 2202 08/13/13 0739  GLUCAP 112* 158* 122*    Imaging Dg Chest Port 1 View  08/12/2013   CLINICAL DATA:  Pre-op for buttock abscess surgery.  EXAM: PORTABLE CHEST - 1 VIEW  COMPARISON:  DG CHEST 2 VIEW dated 06/07/2013; DG CHEST 2 VIEW dated 06/11/2012; DG CHEST 2 VIEW dated 03/17/2011  FINDINGS: 1618 hr. There are lower lung volumes with increased patchy perihilar opacities in the right lung. The left lung is clear. The heart size and mediastinal contours are stable. There is no pneumothorax or significant pleural effusion. The osseous structures appear unchanged.  IMPRESSION: New patchy perihilar opacities in the right lung, likely atelectasis. No consolidation or significant changes demonstrated.   Electronically Signed   By: Camie Patience M.D.   On: 08/12/2013 16:28     CXR:  3/2 B interstitial prominence and infiltrates R>L  ASSESSMENT / PLAN:  PULMONARY A: Early ALI pattern vs cardiogenic edema on chest x ray. At risk for ARDS. MO may impede ventilation P:   O2 as needed Support as needed, including MV if indicated. Note going back to OR  CARDIOVASCULAR A: Shock (presumed sepsis) P:  Hydration Abx as per ID section Pressors as needed Lasix and ace-i stopped Place cvl for pressors and cvp monitoring, goal CVP 8-12 TTE to eval L and R heart fxn  RENAL Lab Results  Component Value Date   CREATININE 1.57* 08/13/2013   CREATININE 1.80* 08/12/2013   CREATININE 1.2* 01/29/2013   CREATININE 1.3* 04/17/2012   A:  Acute renal failure, likely ATN P:   IVF resuscitation  Follow S Cr, lytes Urine Na, Cr  GASTROINTESTINAL A:  GI protection P:   PPI  HEMATOLOGIC A:  Anemia P:  Transfusion for goal Hgb > 7.0  INFECTIOUS A:  Nec fascitis R labia P:   Vanco, pip/tazo, clinda started 3/2 Check procalcitonin Follow cx data Surgical tx per CCS, may need further debridement   ENDOCRINE A:  DM P:   SSI  NEUROLOGIC A:  No acute issue P:     TODAY'S SUMMARY:  80 yo obese female (250 lbs) with history of breast cancer, PAF,CVA, DM,GERD, DVT who presented 3/2 with rt labia soreness  and raised area. She was taken to OR(3/3) by surgery for exploration and debridement with findings consistent with necrotizing fascitis with CPC. She has become  hypotensive post 3/3 and is at risk for sepsis therefore PCCM has been consulted for presumed shock 3/3.  CC time 60 minutes  Richardson Landry Minor ACNP [facility] Pager [phone] till 3 pm If no answer page [phone] 08/13/2013, 8:10 AM  Baltazar Apo, MD, PhD 08/13/2013, 9:10 AM [facility] [phone] or if no answer [phone]

## 2013-08-13 NOTE — Progress Notes (Signed)
Pt with b/p still low and hr briefly up into the 130's. Midlevel informed awaiting call back.

## 2013-08-13 NOTE — Progress Notes (Signed)
Shift event: Pt in with necrotizing fascitits of the perineal area s/p debridement in the OR on 08/12/13 and has been postop for about 10 hrs. Her BP has been soft all shift which has been stablized with NS boluses up to present. BP now in the 60s with a MAP of 35. Lactate was normal, so will start Neo and titrate to SBP of 90 or higher and MAP of 65. NP to bedside.  Pt is awake and appropriate. Denies pain of any type. VS reviewed. Pt is not tachycardic at present. Good UO.  1. Hypotension-is s/p about multiple boluses plus regular IVF running at 125/hr. Will start Neo for BP support. Discussed with pt and with PCCM.  Clance Boll, NP [facility]

## 2013-08-13 NOTE — Op Note (Signed)
NAMEMARLITA, [name]                ACCOUNT NO.:  [id number]  MEDICAL RECORD NO.:  [id number]  LOCATION:  84                         FACILITY:  [facility]  PHYSICIAN:  Earnstine Regal, MD      DATE OF BIRTH:  [birth year]  DATE OF PROCEDURE:  08/12/2013                              OPERATIVE REPORT   PREOPERATIVE DIAGNOSIS:  Necrotizing fasciitis of the perineum.  POSTOPERATIVE DIAGNOSIS:  Necrotizing fasciitis of the perineum.  PROCEDURE:  Debridement and drainage of necrotizing fasciitis of perineum.  SURGEON:  Earnstine Regal, MD, FACS  ANESTHESIA:  General per Cleon Dew. Rose, MD  ESTIMATED BLOOD LOSS:  50 mL.  PREPARATION:  Betadine.  COMPLICATIONS:  None.  INDICATIONS:  The patient is an 80 year old female, who presented to the emergency department with 3-day history of pain and swelling in the perineum.  On presentation, there was an area of necrosis of the skin at the base of the right labia and anterior right buttock.  Laboratory studies showed an elevated white blood cell count.  General Surgery was consulted and the patient was prepared and brought emergently to the operating room for drainage and debridement.  BODY OF REPORT:  Procedures done in OR #1 at the [facility].  The patient was brought to the operating room, placed in supine position on the operating room table.  Following administration of general anesthesia, the patient was positioned in lithotomy and prepped and draped in the usual aseptic fashion.  After ascertaining that an adequate level of anesthesia had been achieved, the necrotic skin is debrided, full-thickness, with the electrocautery used for hemostasis.  The wound was then explored digitally.  There is soft necrotic tissue.  There is not a large purulent abscess cavity.  There is gray, foul-smelling dishwater type fluid emanating from the deep subcutaneous tissues.  The cavity tracks along the right labia up to the mons pubis.   This track is relatively extensive.  It was opened by incising the skin with the electrocautery from the anterior right buttock to the right side of the mons pubis, and down into the area of necrosis and infection.  The cavity also tracks posteriorly into the medial right buttock, and this was opened for approximately 10 cm posteriorly.  Area was then irrigated with warm saline.  Hemostasis was achieved with the electrocautery.  This extensive wound was then packed with 4 inch Kerlix gauze saturated with Betadine.  Dry gauze dressings and ABD dressings were placed over the packing.  The patient is awakened from and taken out of lithotomy and awakened from anesthesia and brought to the recovery room.  She will be admitted to the step-down unit postoperatively.  Case was discussed with Dr. Janece Canterbury from [facility] from the recovery room.   Earnstine Regal, MD, [facility] Office: [phone]    TMG/MEDQ  D:  08/12/2013  T:  08/13/2013  Job:  [id number]  cc:   Biagio Borg, MD

## 2013-08-14 ENCOUNTER — Inpatient Hospital Stay (HOSPITAL_COMMUNITY): Payer: Medicare Other

## 2013-08-14 ENCOUNTER — Inpatient Hospital Stay (HOSPITAL_COMMUNITY): Payer: Medicare Other | Admitting: Anesthesiology

## 2013-08-14 ENCOUNTER — Encounter (HOSPITAL_COMMUNITY): Payer: Medicare Other | Admitting: Anesthesiology

## 2013-08-14 ENCOUNTER — Encounter (HOSPITAL_COMMUNITY): Payer: Self-pay | Admitting: Surgery

## 2013-08-14 ENCOUNTER — Encounter (HOSPITAL_COMMUNITY)
Admission: EM | Disposition: A | Discharge status: Skilled Nursing Facility | Payer: Self-pay | Source: Home / Self Care | Attending: Emergency Medicine

## 2013-08-14 HISTORY — PX: IRRIGATION AND DEBRIDEMENT ABSCESS: SHX5252

## 2013-08-14 LAB — BLOOD GAS, ARTERIAL
Acid-base deficit: 9.3 mmol/L — ABNORMAL HIGH (ref 0.0–2.0)
Acid-base deficit: 7.3 mmol/L — ABNORMAL HIGH (ref 0.0–2.0)
Bicarbonate: 21.1 mEq/L (ref 20.0–24.0)
Bicarbonate: 19.2 mEq/L — ABNORMAL LOW (ref 20.0–24.0)
Drawn by: 307971
Drawn by: 307971
FIO2: 1 %
FIO2: 1 %
MECHVT: 500 mL
O2 Saturation: 98.1 %
O2 Saturation: 99.1 %
PEEP: 5 cmH2O
Patient temperature: 37
Patient temperature: 37
RATE: 15 resp/min
TCO2: 20.5 mmol/L (ref 0–100)
TCO2: 17.2 mmol/L (ref 0–100)
pCO2 arterial: 69.4 mmHg (ref 35.0–45.0)
pCO2 arterial: 43.8 mmHg (ref 35.0–45.0)
pH, Arterial: 7.111 — CL (ref 7.350–7.450)
pH, Arterial: 7.264 — ABNORMAL LOW (ref 7.350–7.450)
pO2, Arterial: 149 mmHg — ABNORMAL HIGH (ref 80.0–100.0)
pO2, Arterial: 318 mmHg — ABNORMAL HIGH (ref 80.0–100.0)

## 2013-08-14 LAB — CBC
HCT: 37.7 % (ref 36.0–46.0)
Hemoglobin: 11.7 g/dL — ABNORMAL LOW (ref 12.0–15.0)
MCH: 29.6 pg (ref 26.0–34.0)
MCHC: 31 g/dL (ref 30.0–36.0)
MCV: 95.4 fL (ref 78.0–100.0)
Platelets: 232 10*3/uL (ref 150–400)
RBC: 3.95 MIL/uL (ref 3.87–5.11)
RDW: 16.3 % — ABNORMAL HIGH (ref 11.5–15.5)
WBC: 16.9 10*3/uL — ABNORMAL HIGH (ref 4.0–10.5)

## 2013-08-14 LAB — BASIC METABOLIC PANEL
BUN: 43 mg/dL — ABNORMAL HIGH (ref 6–23)
CO2: 20 mEq/L (ref 19–32)
Calcium: 8.4 mg/dL (ref 8.4–10.5)
Chloride: 107 mEq/L (ref 96–112)
Creatinine, Ser: 1.28 mg/dL — ABNORMAL HIGH (ref 0.50–1.10)
GFR calc Af Amer: 44 mL/min — ABNORMAL LOW (ref >90)
GFR calc non Af Amer: 38 mL/min — ABNORMAL LOW (ref >90)
Glucose, Bld: 118 mg/dL — ABNORMAL HIGH (ref 70–99)
Potassium: 4 mEq/L (ref 3.7–5.3)
Sodium: 141 mEq/L (ref 137–147)

## 2013-08-14 LAB — GLUCOSE, CAPILLARY
Glucose-Capillary: 108 mg/dL — ABNORMAL HIGH (ref 70–99)
Glucose-Capillary: 133 mg/dL — ABNORMAL HIGH (ref 70–99)
Glucose-Capillary: 134 mg/dL — ABNORMAL HIGH (ref 70–99)
Glucose-Capillary: 118 mg/dL — ABNORMAL HIGH (ref 70–99)
Glucose-Capillary: 104 mg/dL — ABNORMAL HIGH (ref 70–99)

## 2013-08-14 LAB — MAGNESIUM: Magnesium: 1.8 mg/dL (ref 1.5–2.5)

## 2013-08-14 LAB — PHOSPHORUS: Phosphorus: 3.4 mg/dL (ref 2.3–4.6)

## 2013-08-14 SURGERY — IRRIGATION AND DEBRIDEMENT ABSCESS
Anesthesia: General | Site: Perineum

## 2013-08-14 MED ORDER — ENOXAPARIN SODIUM 60 MG/0.6ML ~~LOC~~ SOLN
55.0000 mg | SUBCUTANEOUS | Status: DC
  Administered 2013-08-14 – 2013-08-18 (×5): 55 mg via SUBCUTANEOUS
  Filled 2013-08-14 (×6): qty 0.6

## 2013-08-14 MED ORDER — HYDROMORPHONE HCL PF 1 MG/ML IJ SOLN: 0.2500 mg | INTRAMUSCULAR | Status: DC | PRN

## 2013-08-14 MED ORDER — KETAMINE HCL 10 MG/ML IJ SOLN
INTRAMUSCULAR | Status: DC | PRN
  Administered 2013-08-14: 25 mg via INTRAVENOUS

## 2013-08-14 MED ORDER — HYDROMORPHONE HCL PF 1 MG/ML IJ SOLN
1.0000 mg | INTRAMUSCULAR | Status: DC | PRN
  Administered 2013-08-14 – 2013-08-15 (×2): 1 mg via INTRAVENOUS
  Filled 2013-08-14 (×3): qty 1

## 2013-08-14 MED ORDER — 0.9 % SODIUM CHLORIDE (POUR BTL) OPTIME
TOPICAL | Status: DC | PRN
  Administered 2013-08-14: 1000 mL

## 2013-08-14 MED ORDER — CHLORHEXIDINE GLUCONATE 0.12 % MT SOLN
15.0000 mL | Freq: Two times a day (BID) | OROMUCOSAL | Status: DC
  Administered 2013-08-14 – 2013-08-29 (×27): 15 mL via OROMUCOSAL
  Filled 2013-08-14 (×30): qty 15

## 2013-08-14 MED ORDER — LIDOCAINE HCL (CARDIAC) 20 MG/ML IV SOLN
INTRAVENOUS | Status: DC | PRN
  Administered 2013-08-14: 30 mg via INTRAVENOUS

## 2013-08-14 MED ORDER — FENTANYL CITRATE 0.05 MG/ML IJ SOLN
INTRAMUSCULAR | Status: AC
  Filled 2013-08-14: qty 2

## 2013-08-14 MED ORDER — ETOMIDATE 2 MG/ML IV SOLN
INTRAVENOUS | Status: AC
  Administered 2013-08-14: 20 mg
  Filled 2013-08-14: qty 20

## 2013-08-14 MED ORDER — FENTANYL CITRATE 0.05 MG/ML IJ SOLN
INTRAMUSCULAR | Status: DC | PRN
  Administered 2013-08-14: 50 ug via INTRAVENOUS

## 2013-08-14 MED ORDER — PHENYLEPHRINE HCL 10 MG/ML IJ SOLN
INTRAMUSCULAR | Status: DC | PRN
  Administered 2013-08-14 (×2): 20 ug via INTRAVENOUS

## 2013-08-14 MED ORDER — FENTANYL CITRATE 0.05 MG/ML IJ SOLN
INTRAMUSCULAR | Status: AC
  Administered 2013-08-14: 50 ug
  Filled 2013-08-14: qty 2

## 2013-08-14 MED ORDER — ROCURONIUM BROMIDE 50 MG/5ML IV SOLN
INTRAVENOUS | Status: AC
  Filled 2013-08-14: qty 2

## 2013-08-14 MED ORDER — FENTANYL CITRATE 0.05 MG/ML IJ SOLN
50.0000 ug | INTRAMUSCULAR | Status: DC | PRN
  Administered 2013-08-14 – 2013-08-22 (×42): 50 ug via INTRAVENOUS
  Filled 2013-08-14 (×41): qty 2

## 2013-08-14 MED ORDER — SUCCINYLCHOLINE CHLORIDE 20 MG/ML IJ SOLN
INTRAMUSCULAR | Status: AC
  Filled 2013-08-14: qty 1

## 2013-08-14 MED ORDER — NALOXONE HCL 0.4 MG/ML IJ SOLN
0.4000 mg | INTRAMUSCULAR | Status: DC | PRN
  Administered 2013-08-14: 0.4 mg via INTRAVENOUS

## 2013-08-14 MED ORDER — FENTANYL CITRATE 0.05 MG/ML IJ SOLN
INTRAMUSCULAR | Status: AC
  Administered 2013-08-14: 100 ug
  Filled 2013-08-14: qty 2

## 2013-08-14 MED ORDER — PROPOFOL 10 MG/ML IV BOLUS
INTRAVENOUS | Status: DC | PRN
  Administered 2013-08-14: 20 mg via INTRAVENOUS
  Administered 2013-08-14: 100 mg via INTRAVENOUS
  Administered 2013-08-14: 20 mg via INTRAVENOUS

## 2013-08-14 MED ORDER — PANTOPRAZOLE SODIUM 40 MG IV SOLR
40.0000 mg | INTRAVENOUS | Status: DC
  Administered 2013-08-15 – 2013-08-18 (×4): 40 mg via INTRAVENOUS
  Filled 2013-08-14 (×5): qty 40

## 2013-08-14 MED ORDER — LACTATED RINGERS IV SOLN: INTRAVENOUS | Status: DC

## 2013-08-14 MED ORDER — VITAL HIGH PROTEIN PO LIQD
1000.0000 mL | ORAL | Status: DC
  Administered 2013-08-14: 22:00:00
  Administered 2013-08-14: 1000 mL
  Administered 2013-08-15 – 2013-08-17 (×8)
  Administered 2013-08-18 – 2013-08-20 (×3): 1000 mL
  Administered 2013-08-20: 20:00:00
  Filled 2013-08-14 (×12): qty 1000

## 2013-08-14 MED ORDER — PROPOFOL 10 MG/ML IV BOLUS
INTRAVENOUS | Status: AC
  Filled 2013-08-14: qty 20

## 2013-08-14 MED ORDER — MIDAZOLAM HCL 2 MG/2ML IJ SOLN: 2.0000 mg | Freq: Once | INTRAMUSCULAR | Status: AC

## 2013-08-14 MED ORDER — BIOTENE DRY MOUTH MT LIQD
15.0000 mL | Freq: Four times a day (QID) | OROMUCOSAL | Status: DC
  Administered 2013-08-15 – 2013-08-29 (×53): 15 mL via OROMUCOSAL

## 2013-08-14 MED ORDER — MIDAZOLAM HCL 2 MG/2ML IJ SOLN
INTRAMUSCULAR | Status: AC
  Administered 2013-08-14: 2 mg
  Filled 2013-08-14: qty 2

## 2013-08-14 MED ORDER — MIDAZOLAM HCL 2 MG/2ML IJ SOLN
1.0000 mg | INTRAMUSCULAR | Status: DC | PRN
  Administered 2013-08-15 (×2): 1 mg via INTRAVENOUS
  Filled 2013-08-14 (×8): qty 2

## 2013-08-14 MED ORDER — FENTANYL CITRATE 0.05 MG/ML IJ SOLN
100.0000 ug | Freq: Once | INTRAMUSCULAR | Status: DC
  Filled 2013-08-14: qty 2

## 2013-08-14 MED ORDER — ONDANSETRON HCL 4 MG/2ML IJ SOLN
INTRAMUSCULAR | Status: AC
  Filled 2013-08-14: qty 2

## 2013-08-14 MED ORDER — MIDAZOLAM HCL 2 MG/2ML IJ SOLN
INTRAMUSCULAR | Status: AC
  Administered 2013-08-14: 1 mg
  Filled 2013-08-14: qty 2

## 2013-08-14 MED ORDER — MIDAZOLAM HCL 2 MG/2ML IJ SOLN
1.0000 mg | INTRAMUSCULAR | Status: DC | PRN
  Administered 2013-08-14 – 2013-08-20 (×21): 1 mg via INTRAVENOUS
  Filled 2013-08-14 (×16): qty 2

## 2013-08-14 MED ORDER — NALOXONE HCL 0.4 MG/ML IJ SOLN
INTRAMUSCULAR | Status: AC
  Administered 2013-08-14: 0.4 mg via INTRAVENOUS
  Filled 2013-08-14: qty 1

## 2013-08-14 MED ORDER — LIDOCAINE HCL (CARDIAC) 20 MG/ML IV SOLN
INTRAVENOUS | Status: AC
  Filled 2013-08-14: qty 5

## 2013-08-14 MED ORDER — LACTATED RINGERS IV SOLN
INTRAVENOUS | Status: DC | PRN
  Administered 2013-08-14: 13:00:00 via INTRAVENOUS

## 2013-08-14 SURGICAL SUPPLY — 33 items
BENZOIN TINCTURE PRP APPL 2/3 (GAUZE/BANDAGES/DRESSINGS) IMPLANT
BLADE HEX COATED 2.75 (ELECTRODE) ×2 IMPLANT
BLADE SURG SZ10 CARB STEEL (BLADE) ×2 IMPLANT
CANISTER SUCTION 2500CC (MISCELLANEOUS) IMPLANT
DECANTER SPIKE VIAL GLASS SM (MISCELLANEOUS) IMPLANT
DRAIN CHANNEL RND F F (WOUND CARE) IMPLANT
DRAPE LAPAROTOMY T 102X78X121 (DRAPES) IMPLANT
DRAPE LAPAROTOMY TRNSV 102X78 (DRAPE) IMPLANT
DRAPE LG THREE QUARTER DISP (DRAPES) IMPLANT
ELECT REM PT RETURN 9FT ADLT (ELECTROSURGICAL) ×2
ELECTRODE REM PT RTRN 9FT ADLT (ELECTROSURGICAL) ×1 IMPLANT
EVACUATOR SILICONE 100CC (DRAIN) IMPLANT
GLOVE BIOGEL PI IND STRL 7.0 (GLOVE) ×1 IMPLANT
GLOVE BIOGEL PI INDICATOR 7.0 (GLOVE) ×1
GLOVE SURG ORTHO 8.0 STRL STRW (GLOVE) ×2 IMPLANT
GOWN STRL REUS W/TWL LRG LVL3 (GOWN DISPOSABLE) IMPLANT
GOWN STRL REUS W/TWL XL LVL3 (GOWN DISPOSABLE) ×4 IMPLANT
KIT BASIN OR (CUSTOM PROCEDURE TRAY) ×2 IMPLANT
MARKER SKIN DUAL TIP RULER LAB (MISCELLANEOUS) IMPLANT
NEEDLE HYPO 25X1 1.5 SAFETY (NEEDLE) IMPLANT
NS IRRIG 1000ML POUR BTL (IV SOLUTION) ×2 IMPLANT
PACK GENERAL/GYN (CUSTOM PROCEDURE TRAY) IMPLANT
PACK LITHOTOMY IV (CUSTOM PROCEDURE TRAY) ×2 IMPLANT
SPONGE GAUZE 4X4 12PLY (GAUZE/BANDAGES/DRESSINGS) IMPLANT
SPONGE LAP 18X18 X RAY DECT (DISPOSABLE) ×2 IMPLANT
STAPLER VISISTAT 35W (STAPLE) IMPLANT
STRIP CLOSURE SKIN 1/2X4 (GAUZE/BANDAGES/DRESSINGS) IMPLANT
SUT ETHILON 3 0 PS 1 (SUTURE) IMPLANT
SUT MNCRL AB 4-0 PS2 18 (SUTURE) IMPLANT
SUT VIC AB 3-0 SH 18 (SUTURE) IMPLANT
SYR CONTROL 10ML LL (SYRINGE) IMPLANT
TOWEL OR 17X26 10 PK STRL BLUE (TOWEL DISPOSABLE) ×2 IMPLANT
TOWEL OR NON WOVEN STRL DISP B (DISPOSABLE) ×2 IMPLANT

## 2013-08-14 NOTE — Anesthesia Postprocedure Evaluation (Signed)
  Anesthesia Post-op Note  Patient: [name]  Procedure(s) Performed: Procedure(s) (LRB): Debridment of perineal tissue and debridement of perineal wound (N/A)  Patient Location: ICU  Anesthesia Type: General  Level of Consciousness: sedated  Airway and Oxygen Therapy:  Intubated on ventilator  Post-op Pain: mild  Post-op Assessment: Post-op Vital signs reviewed, Patient's Cardiovascular Status Stable, Intubated on ventilator  Last Vitals:  Filed Vitals:   08/14/13 1219  BP: 97/50  Pulse: 60  Temp:   Resp: 15    Post-op Vital Signs: stable   Complications: No apparent anesthesia complications

## 2013-08-14 NOTE — Progress Notes (Signed)
NUTRITION FOLLOW UP  Intervention:   - Initiate TF via OGT of Vital High Protein start at 62m/hr increase by 14mevery 4 hours to goal of 4077mr with Prostat 14m81mD. This will provide 1260 calories, 129g protein, and 802ml54me water which will meet 72% estimated calorie needs (25 kcals/kg) and 100% estimated protein needs. If IVF d/c, recommend 120ml 85mr flushes 4 times/day.  - Initiate adult enteral protocol - Will continue to monitor   Nutrition Dx:   Inadequate oral intake related to clear liquid diet as evidenced by diet order - ongoing    Goal:   Advance diet as diabetic diet - not met, pt ventilated  New goal: Enteral nutrition to provide 60-70% of estimated calorie needs (22-25 kcals/kg ideal body weight) and 100% of estimated protein needs, based on ASPEN guidelines for permissive underfeeding in critically ill obese individuals.   Monitor:   Weights, labs, TF tolerance/advancement, vent status   Assessment:   Admitted with abscess on buttocks x 3 days, found to have necrotizing fascitis of the perineum. Pt with history of paroxysmal atrial flutter, T2DM, morbid obesity, CVA, GERD, post-operative DVT, CKD, breast CA, anxiety, and anemia. Had incision and drainage of abscess 08/12/13.   3/3 - Met with pt and family who report pt has been hardly eating anything for the past 3 days. States before then she was eating 2 meals/day and some snacks and reports her meals were well balanced. Denies any nausea or diarrhea today, had both yesterday. Denies any changes in weight.   3/4 - Noted events of earlier this morning with decrease in oxygen saturations and pt becoming unresponsive. Intubated today. Plans for additional surgery today. Obtained verbal order from NP to manage TF. Pt with mittens on.   Patient is currently intubated on ventilator support.  MV: 9.1 L/min Temp (24hrs), Avg:98.1 F (36.7 C), Min:97.4 F (36.3 C), Max:98.8 F (37.1 C)  Propofol: off  BUN/Cr  elevated but trending down GFR low but improving slightly Potassium, magnesium, and phosphorus WNL   Height: Ht Readings from Last 1 Encounters:  08/14/13 5' 2"  (1.575 m)    Weight Status:   Wt Readings from Last 1 Encounters:  08/13/13 254 lb 3.1 oz (115.3 kg)  Admit wt:        250 lb (113.3 kg) Net I/Os: +6.4L  Re-estimated needs:  Kcal: 1756 Protein: 125g Fluid: >1.7L/day  Skin: +1 generalized edema, perineum incision, right buttocks incision    Diet Order: NPO   Intake/Output Summary (Last 24 hours) at 08/14/13 1038 Last data filed at 08/14/13 0800  Gross per 24 hour  Intake 4484.94 ml  Output   1305 ml  Net 3179.94 ml    Last BM: 3/4    Labs:   Recent Labs Lab 08/12/13 1338 08/13/13 0310 08/14/13 0351  NA 139 138 141  K 4.3 4.6 4.0  CL 102 103 107  CO2  --  24 20  BUN 59* 53* 43*  CREATININE 1.80* 1.57* 1.28*  CALCIUM  --  8.0* 8.4  MG  --   --  1.8  PHOS  --   --  3.4  GLUCOSE 146* 171* 118*    CBG (last 3)   Recent Labs  08/13/13 2234 08/14/13 0602 08/14/13 0741  GLUCAP 114* 108* 133*    Scheduled Meds: . allopurinol  100 mg Oral QODAY  . anastrozole  1 mg Oral q morning - 10a  . aspirin EC  243 mg Oral QODAY  .  aspirin EC  325 mg Oral QODAY  . cholecalciferol  1,000 Units Oral q morning - 10a  . clindamycin (CLEOCIN) IV  600 mg Intravenous 3 times per day  . enoxaparin (LOVENOX) injection  55 mg Subcutaneous Q24H  . feeding supplement (RESOURCE BREEZE)  1 Container Oral TID BM  . insulin aspart  0-9 Units Subcutaneous TID WC  . lidocaine (cardiac) 100 mg/45m      . nutrition supplement (JUVEN)  1 packet Oral BID BM  . piperacillin-tazobactam (ZOSYN)  IV  3.375 g Intravenous Q8H  . rocuronium      . sodium chloride  3 mL Intravenous Q12H  . succinylcholine      . vancomycin  1,500 mg Intravenous Q24H    Continuous Infusions: . sodium chloride 100 mL/hr at 08/14/13 0223  . sodium chloride 20 mL/hr at 08/14/13 0800  .  phenylephrine (NEO-SYNEPHRINE) Adult infusion 80 mcg/min (08/14/13 0858)    HMikey CollegeMS, RD, LDN [phone] [phone] Hours Pager

## 2013-08-14 NOTE — Progress Notes (Signed)
eLink Physician-Brief Progress Note Patient Name: [name] DOB: 10-02-1929 MRN: [id number]  Date of Service  08/14/2013   HPI/Events of Note  Call from bedside nurse that patient abruptly dropped her sats into the 66s and became unresponsive.  Patient placed on 100% NRB with increase in sats can elink contacted.  Review of medications administered showed patient had received 2 doses of 1 mg of dilaudid.   eICU Interventions  Plan: 0.4 mg narcan IV times one Patient gradually more responsive with stable BP and sats of 100% on NRB Dilaudid d/ced for now Consider alternative pain medication of dosage adjustment      [name] 08/14/2013, 6:38 AM

## 2013-08-14 NOTE — Procedures (Signed)
Intubation Procedure Note [name] [id number] Oct 19, 1929  Procedure: Intubation Indications: Respiratory insufficiency  Procedure Details Consent: Unable to obtain consent because of emergent medical necessity. Time Out: Verified patient identification, verified procedure, site/side was marked, verified correct patient position, special equipment/implants available, medications/allergies/relevent history reviewed, required imaging and test results available.  Performed  Maximum sterile technique was used including cap, gloves, gown, hand hygiene and mask.  MAC    Evaluation Hemodynamic Status: BP stable throughout; O2 sats: stable throughout Patient's Current Condition: stable Complications: No apparent complications Patient did tolerate procedure well. Chest X-ray ordered to verify placement.  CXR: tube position acceptable.  Procedure performed by S Minor, ACNP, under my supervision.   [name] S. 08/14/2013

## 2013-08-14 NOTE — Progress Notes (Signed)
Sterile , arterial line insertion, with doppler attempted with 2 RT's present (2 RT's attempted)- unsuccessful. RN and NP aware. NP stated PT will be going to surgery today- OR may be able to place arterial line.

## 2013-08-14 NOTE — Brief Op Note (Signed)
08/12/2013 - 08/14/2013  1:56 PM  PATIENT:  [name]  80 y.o. female  PRE-OPERATIVE DIAGNOSIS:  necrotizing fasciitis of perineum  POST-OPERATIVE DIAGNOSIS:  necrotizing fasciitis of perineum  PROCEDURE:  1. Debridement of skin and subcutaneous tissue right anterior buttock and perirectal tissues  2. Dressing change to right groin wound  SURGEON:  Surgeon(s) and Role:    * Earnstine Regal, MD - Primary  ANESTHESIA:   general  EBL:  Total I/O In: 4 [I.V.:4] Out: 306 [Urine:286; Blood:20]  BLOOD ADMINISTERED:none  DRAINS: none   LOCAL MEDICATIONS USED:  NONE  SPECIMEN:  No Specimen  DISPOSITION OF SPECIMEN:  N/A  COUNTS:  YES  TOURNIQUET:  * No tourniquets in log *  DICTATION: .Other Dictation: Dictation Number [id number]  PLAN OF CARE: Admit to inpatient   PATIENT DISPOSITION:  ICU - intubated and critically ill.   Delay start of Pharmacological VTE agent (>24hrs) due to surgical blood loss or risk of bleeding: yes  Earnstine Regal, MD, [facility] Office: [phone]

## 2013-08-14 NOTE — Transfer of Care (Signed)
Immediate Anesthesia Transfer of Care Note  Patient: [name]  Procedure(s) Performed: Procedure(s): Debridment of perineal tissue and debridement of perineal wound (N/A)  Patient Location: ICU  Anesthesia Type:General  Level of Consciousness: awake, alert  and patient cooperative  Airway & Oxygen Therapy: Patient Spontanous Breathing and Patient remains intubated per anesthesia plan  Post-op Assessment: Report given to PACU RN and Post -op Vital signs reviewed and stable  Post vital signs: stable  Complications: No apparent anesthesia complications

## 2013-08-14 NOTE — Progress Notes (Signed)
Aline to be discontinued per Benjamin Stain, RN--pressure applied to site until no further bleeding. No complications noted.

## 2013-08-14 NOTE — Progress Notes (Signed)
eLink Physician-Brief Progress Note Patient Name: [name] DOB: [birth year] MRN: [id number]  Date of Service  08/14/2013   HPI/Events of Note   Pt with frequent, loose stools causing perineal irritation.  eICU Interventions  Will have RN place flexiseal.   Intervention Category Minor Interventions: Other:  Larnie Heart 08/14/2013, 10:33 PM

## 2013-08-14 NOTE — Progress Notes (Signed)
Name: [name] MRN: [id number] DOB: 09-16-1929    ADMISSION DATE:  08/12/2013 CONSULTATION DATE:  3/3  REFERRING MD :  [facility] PRIMARY SERVICE: [facility]  CHIEF COMPLAINT:  Hypotension  BRIEF PATIENT DESCRIPTION:  80 yo obese female (250 lbs) with history of breast cancer, PAF,CVA, DM,GERD, DVT. S/p exploration and debridement R labial necrotizing fasciitis. To ICU 3/3 with septic shock.  SIGNIFICANT EVENTS / STUDIES:  3/3 to or for debridement labia 3/4 over sedated with dilaudid and required narcan x 2  3/3 TTE EF 123456 grade 2 diastolic dysfunction  LINES / TUBES: 3/3  Left rad a line>>3/4 3/3 lt i j cvl>>  CULTURES: 3/2 wound>>gpc>>  ANTIBIOTICS: 3/2 vanc>> 3/2 pip-tazo>> 3/2 Clinda>>  HISTORY OF PRESENT ILLNESS:   80 yo obese female (250 lbs) with history of breast cancer, PAF,CVA, DM,GERD, DVT who presented 3/2 with rt labia soreness  and raised area. She was taken to OR(3/3) by surgery for exploration and debridement with findings consistent with necrotizing fascitis with CPC. She has become hypotensive post 3/3 and is at risk for sepsis therefore PCCM has been consulted for presumed shock 3/3.   SUBJECTIVE:  Dilaudid started o/n Lethargic despite narcan.   VITAL SIGNS: Temp:  [97.4 F (36.3 C)-98.7 F (37.1 C)] 97.5 F (36.4 C) (03/04 0430) Pulse Rate:  [63-101] 101 (03/04 0626) Resp:  [14-32] 27 (03/04 0626) BP: (110-208)/(28-127) 121/44 mmHg (03/04 0600) SpO2:  [86 %-100 %] 99 % (03/04 0626) Arterial Line BP: (49-163)/(37-140) 49/41 mmHg (03/04 0000) HEMODYNAMICS:   VENTILATOR SETTINGS:   INTAKE / OUTPUT: Intake/Output     03/03 0701 - 03/04 0700 03/04 0701 - 03/05 0700   I.V. (mL/kg) 4670.9 (40.5) 4 (0)   IV Piggyback 800    Total Intake(mL/kg) 5470.9 (47.5) 4 (0)   Urine (mL/kg/hr) 1325 (0.5)    Total Output 1325     Net +4145.9 +4        Stool Occurrence 3 x      PHYSICAL EXAMINATION: General:  MOWF Neuro: lethargic, arouses to loud  stimuli HEENT:  Short neck, No JVD/LAN Cardiovascular:  HSR Lungs:  Decreased thru out . Compromised airway, grunting stertorous respirations.  Abdomen:  Obese +bs Musculoskeletal:  intact Skin:  Labial dressing CDI  LABS:  CBC  Recent Labs Lab 08/12/13 1330 08/12/13 1338 08/13/13 0310 08/14/13 0351  WBC 17.9*  --  18.2* 16.9*  HGB 12.7 13.9 11.3* 11.7*  HCT 39.5 41.0 35.7* 37.7  PLT 187  --  161 232   Coag's No results found for this basename: APTT, INR,  in the last 168 hours BMET  Recent Labs Lab 08/12/13 1338 08/13/13 0310 08/14/13 0351  NA 139 138 141  K 4.3 4.6 4.0  CL 102 103 107  CO2  --  24 20  BUN 59* 53* 43*  CREATININE 1.80* 1.57* 1.28*  GLUCOSE 146* 171* 118*   Electrolytes  Recent Labs Lab 08/13/13 0310 08/14/13 0351  CALCIUM 8.0* 8.4  MG  --  1.8  PHOS  --  3.4   Sepsis Markers  Recent Labs Lab 08/13/13 0315  LATICACIDVEN 0.9   ABG No results found for this basename: PHART, PCO2ART, PO2ART,  in the last 168 hours Liver Enzymes No results found for this basename: AST, ALT, ALKPHOS, BILITOT, ALBUMIN,  in the last 168 hours Cardiac Enzymes No results found for this basename: TROPONINI, PROBNP,  in the last 168 hours Glucose  Recent Labs Lab 08/12/13 2202 08/13/13  [id number] 08/13/13 1141 08/13/13 1650 08/13/13 2234 08/14/13 0602  GLUCAP 158* 122* 108* 151* 114* 108*    Imaging Dg Chest Port 1 View  08/14/2013   CLINICAL DATA:  Increasing shortness of breath.  EXAM: PORTABLE CHEST - 1 VIEW  COMPARISON:  Chest x-ray from yesterday  FINDINGS: Left IJ catheter in similar position when accounting for distortion by rightward rotation.  Persistent cardiopericardial enlargement. Increased hazy density of the chest, likely bilateral layering effusions. There is pulmonary venous congestion and probable edema. No new asymmetric opacity. No pneumothorax.  IMPRESSION: 1. Pulmonary venous hypertension and probable layering pleural effusions. 2.  Bibasilar atelectasis.   Electronically Signed   By: Jorje Guild M.D.   On: 08/14/2013 04:09   Dg Chest Port 1 View  08/13/2013   CLINICAL DATA:  Central line placement  EXAM: PORTABLE CHEST - 1 VIEW  COMPARISON:  08/12/2013  FINDINGS: Left jugular central venous catheter placement. Catheter tip in the SVC. No pneumothorax.  Increase in bibasilar atelectasis.  IMPRESSION: Central line placement in the SVC without pneumothorax  Increase in bibasilar atelectasis.   Electronically Signed   By: Franchot Gallo M.D.   On: 08/13/2013 10:24   Dg Chest Port 1 View  08/12/2013   CLINICAL DATA:  Pre-op for buttock abscess surgery.  EXAM: PORTABLE CHEST - 1 VIEW  COMPARISON:  DG CHEST 2 VIEW dated 06/07/2013; DG CHEST 2 VIEW dated 06/11/2012; DG CHEST 2 VIEW dated 03/17/2011  FINDINGS: 1618 hr. There are lower lung volumes with increased patchy perihilar opacities in the right lung. The left lung is clear. The heart size and mediastinal contours are stable. There is no pneumothorax or significant pleural effusion. The osseous structures appear unchanged.  IMPRESSION: New patchy perihilar opacities in the right lung, likely atelectasis. No consolidation or significant changes demonstrated.   Electronically Signed   By: Camie Patience M.D.   On: 08/12/2013 16:28     CXR:  [place]  ASSESSMENT / PLAN:  PULMONARY A: Bibasilar infiltrates. At risk for ARDS. MO may impede ventilation Compromised airway secondary to narcotics P:   O2 as needed Support as needed, including MV if indicated. Note going back to OR for debridement Check abg to insure she is not hypercarbic Replace a line  CARDIOVASCULAR A: Shock (presumed sepsis) EF 123456 grade 2 diastolic dysfunction P:  Hydration Abx as per ID section Pressors as needed Lasix and ace-i stopped goal CVP 8-12    RENAL Lab Results  Component Value Date   CREATININE 1.28* 08/14/2013   CREATININE 1.57* 08/13/2013   CREATININE 1.80* 08/12/2013   CREATININE 1.2*  01/29/2013   A:  Acute renal failure, likely ATN Urine Na(26), Cr(95) P:   IVF resuscitation  Follow S Cr, lytes  GASTROINTESTINAL A:  GI protection P:   PPI  HEMATOLOGIC A:  Anemia P:  Transfusion for goal Hgb > 7.0  INFECTIOUS A:  Nec fascitis R labia P:   Vanco, pip/tazo, clinda started 3/2 Follow cx data Surgical tx per CCS, will need further debridement   ENDOCRINE A:  DM P:   SSI  NEUROLOGIC A:  No acute issue      Lethargy P:   See pulmonary  TODAY'S SUMMARY:  80 yo obese female (250 lbs) with history of breast cancer, PAF,CVA, DM,GERD, DVT who presented 3/2 with rt labia soreness  and raised area. She was taken to OR(3/3) by surgery for exploration and debridement with findings consistent with necrotizing fascitis with CPC. She has  become hypotensive post 3/3 and is at risk for sepsis therefore PCCM has been consulted for presumed shock 3/3. 3/4 over narcotized with dilaudid. Narcan x 2. May need intubation   CC time 60 minutes  Richardson Landry Minor ACNP [facility] P[facility] Pager [phone] till 3 pm If no answer page [phone] 08/14/2013, 7:49 AM   Baltazar Apo, MD, PhD 08/14/2013, 12:12 PM [facility] [phone] or if no answer [phone]

## 2013-08-14 NOTE — Anesthesia Preprocedure Evaluation (Signed)
Anesthesia Evaluation  Patient identified by MRN, date of birth, ID band Patient awake    Reviewed: Allergy & Precautions, H&P , NPO status , Patient's Chart, lab work & pertinent test results  History of Anesthesia Complications (+) PONV  Airway Mallampati: II TM Distance: >3 FB Neck ROM: full    Dental no notable dental hx.    Pulmonary shortness of breath and with exertion,  breath sounds clear to auscultation  Pulmonary exam normal       Cardiovascular Exercise Tolerance: Poor hypertension, Pt. on medications + dysrhythmias Atrial Fibrillation Rhythm:regular Rate:Normal     Neuro/Psych Anxiety CVA 2002. Tingling palm left hand CVA, Residual Symptoms negative psych ROS   GI/Hepatic negative GI ROS, Neg liver ROS,   Endo/Other  diabetes, Well Controlled, Type 2Morbid obesity  Renal/GU Stage 3 chronic kidney disease  negative genitourinary   Musculoskeletal   Abdominal   Peds  Hematology negative hematology ROS (+)   Anesthesia Other Findings Necrotizing fasciitis perineum with sepsis  Reproductive/Obstetrics negative OB ROS                           Anesthesia Physical Anesthesia Plan  ASA: III  Anesthesia Plan: General   Post-op Pain Management:    Induction: Intravenous  Airway Management Planned: Oral ETT  Additional Equipment:   Intra-op Plan:   Post-operative Plan: Post-operative intubation/ventilation  Informed Consent: I have reviewed the patients History and Physical, chart, labs and discussed the procedure including the risks, benefits and alternatives for the proposed anesthesia with the patient or authorized representative who has indicated his/her understanding and acceptance.   Dental Advisory Given  Plan Discussed with: CRNA and Surgeon  Anesthesia Plan Comments:         Anesthesia Quick Evaluation

## 2013-08-15 ENCOUNTER — Inpatient Hospital Stay (HOSPITAL_COMMUNITY): Payer: Medicare Other

## 2013-08-15 ENCOUNTER — Encounter (HOSPITAL_COMMUNITY): Payer: Self-pay | Admitting: Surgery

## 2013-08-15 LAB — BASIC METABOLIC PANEL WITH GFR
BUN: 42 mg/dL — ABNORMAL HIGH (ref 6–23)
CO2: 21 meq/L (ref 19–32)
Calcium: 8.6 mg/dL (ref 8.4–10.5)
Chloride: 113 meq/L — ABNORMAL HIGH (ref 96–112)
Creatinine, Ser: 1.3 mg/dL — ABNORMAL HIGH (ref 0.50–1.10)
GFR calc Af Amer: 43 mL/min — ABNORMAL LOW
GFR calc non Af Amer: 37 mL/min — ABNORMAL LOW
Glucose, Bld: 139 mg/dL — ABNORMAL HIGH (ref 70–99)
Potassium: 4 meq/L (ref 3.7–5.3)
Sodium: 145 meq/L (ref 137–147)

## 2013-08-15 LAB — GLUCOSE, CAPILLARY
Glucose-Capillary: 128 mg/dL — ABNORMAL HIGH (ref 70–99)
Glucose-Capillary: 144 mg/dL — ABNORMAL HIGH (ref 70–99)
Glucose-Capillary: 144 mg/dL — ABNORMAL HIGH (ref 70–99)
Glucose-Capillary: 148 mg/dL — ABNORMAL HIGH (ref 70–99)
Glucose-Capillary: 124 mg/dL — ABNORMAL HIGH (ref 70–99)
Glucose-Capillary: 151 mg/dL — ABNORMAL HIGH (ref 70–99)

## 2013-08-15 LAB — PHOSPHORUS: Phosphorus: 3.1 mg/dL (ref 2.3–4.6)

## 2013-08-15 LAB — VANCOMYCIN, TROUGH: Vancomycin Tr: 19.5 ug/mL (ref 10.0–20.0)

## 2013-08-15 LAB — CBC
HCT: 33.3 % — ABNORMAL LOW (ref 36.0–46.0)
Hemoglobin: 10.3 g/dL — ABNORMAL LOW (ref 12.0–15.0)
MCH: 29.3 pg (ref 26.0–34.0)
MCHC: 30.9 g/dL (ref 30.0–36.0)
MCV: 94.9 fL (ref 78.0–100.0)
Platelets: 189 K/uL (ref 150–400)
RBC: 3.51 MIL/uL — ABNORMAL LOW (ref 3.87–5.11)
RDW: 16.7 % — ABNORMAL HIGH (ref 11.5–15.5)
WBC: 9.2 K/uL (ref 4.0–10.5)

## 2013-08-15 LAB — MAGNESIUM: Magnesium: 1.7 mg/dL (ref 1.5–2.5)

## 2013-08-15 MED ORDER — VANCOMYCIN HCL IN DEXTROSE 1-5 GM/200ML-% IV SOLN
1000.0000 mg | INTRAVENOUS | Status: DC
  Administered 2013-08-15 – 2013-08-22 (×8): 1000 mg via INTRAVENOUS
  Filled 2013-08-15 (×8): qty 200

## 2013-08-15 MED ORDER — PRO-STAT SUGAR FREE PO LIQD
30.0000 mL | Freq: Three times a day (TID) | ORAL | Status: DC
  Administered 2013-08-15 – 2013-08-21 (×18): 30 mL
  Filled 2013-08-15 (×24): qty 30

## 2013-08-15 NOTE — Progress Notes (Signed)
Name: [name] MRN: [id number] DOB: 15-Oct-1929    ADMISSION DATE:  08/12/2013 CONSULTATION DATE:  3/3  REFERRING MD :  triad PRIMARY SERVICE: Triad  CHIEF COMPLAINT:  Hypotension  BRIEF PATIENT DESCRIPTION:  80 yo obese female (250 lbs) with history of breast cancer, PAF,CVA, DM,GERD, DVT. S/p exploration and debridement R labial necrotizing fasciitis. To ICU 3/3 with septic shock.  SIGNIFICANT EVENTS / STUDIES:  3/3 to or for debridement labia 3/4 over sedated with dilaudid and required narcan x 2  3/3 TTE EF 123456 grade 2 diastolic dysfunction  LINES / TUBES: 3/3  Left rad a line>>3/4 3/3 lt i j cvl>>  CULTURES: 3/2 wound>>gpc>>  ANTIBIOTICS: 3/2 vanc>> 3/2 pip-tazo>> 3/2 Clinda>>  SUBJECTIVE:  S/p wound debridement 3/4, remains intubated   VITAL SIGNS: Temp:  [98.1 F (36.7 C)-98.9 F (37.2 C)] 98.1 F (36.7 C) (03/05 0800) Pulse Rate:  [60-85] 73 (03/05 0800) Resp:  [15-26] 15 (03/05 0800) BP: (91-153)/(25-83) 151/48 mmHg (03/05 0800) SpO2:  [98 %-100 %] 100 % (03/05 0800) FiO2 (%):  [40 %-100 %] 40 % (03/05 0900) HEMODYNAMICS:   VENTILATOR SETTINGS: Vent Mode:  [-] PRVC FiO2 (%):  [40 %-100 %] 40 % Set Rate:  [15 bmp] 15 bmp Vt Set:  [500 mL] 500 mL PEEP:  [5 cmH20] 5 cmH20 Plateau Pressure:  [20 cmH20-27 cmH20] 22 cmH20 INTAKE / OUTPUT: Intake/Output     03/04 0701 - 03/05 0700 03/05 0701 - 03/06 0700   I.V. (mL/kg) 3462.4 (30) 120 (1)   Other 100    NG/GT 410.7 40   IV Piggyback 800    Total Intake(mL/kg) 4773.1 (41.4) 160 (1.4)   Urine (mL/kg/hr) 964 (0.3) 185 (0.5)   Blood 20 (0)    Total Output 984 185   Net +3789.1 -25          PHYSICAL EXAMINATION: General:  MOWF Neuro: lethargic, arouses to voice and nods to questions, moves UE's HEENT:  Short neck, No JVD/LAN Cardiovascular:  Regular, distant, no M Lungs:  Basilar insp crackles, no wheeze Abdomen:  Obese +bs Musculoskeletal:  intact Skin:  Labial dressing  CDI  LABS:  CBC  Recent Labs Lab 08/13/13 0310 08/14/13 0351 08/15/13 0215  WBC 18.2* 16.9* 9.2  HGB 11.3* 11.7* 10.3*  HCT 35.7* 37.7 33.3*  PLT 161 232 189   Coag's No results found for this basename: APTT, INR,  in the last 168 hours BMET  Recent Labs Lab 08/13/13 0310 08/14/13 0351 08/15/13 0215  NA 138 141 145  K 4.6 4.0 4.0  CL 103 107 113*  CO2 24 20 21   BUN 53* 43* 42*  CREATININE 1.57* 1.28* 1.30*  GLUCOSE 171* 118* 139*   Electrolytes  Recent Labs Lab 08/13/13 0310 08/14/13 0351 08/15/13 0215  CALCIUM 8.0* 8.4 8.6  MG  --  1.8 1.7  PHOS  --  3.4 3.1   Sepsis Markers  Recent Labs Lab 08/13/13 0315  LATICACIDVEN 0.9   ABG  Recent Labs Lab 08/14/13 0800 08/14/13 1106  PHART 7.111* 7.264*  PCO2ART 69.4* 43.8  PO2ART 149.0* 318.0*   Liver Enzymes No results found for this basename: AST, ALT, ALKPHOS, BILITOT, ALBUMIN,  in the last 168 hours Cardiac Enzymes No results found for this basename: TROPONINI, PROBNP,  in the last 168 hours Glucose  Recent Labs Lab 08/14/13 1253 08/14/13 1643 08/14/13 1918 08/14/13 2322 08/15/13 0328 08/15/13 0742  GLUCAP 134* 118* 104* 128* 144* 144*    Imaging Dg  Chest Port 1 View  08/15/2013   CLINICAL DATA:  Respiratory distress  EXAM: PORTABLE CHEST - 1 VIEW  COMPARISON:  08/14/2013  FINDINGS: Endotracheal tube is 1 cm above the carina. NG tube enters the stomach. Central venous catheter tip in the SVC is unchanged.  Bibasilar consolidation unchanged. Small bilateral effusions unchanged. Pulmonary vascular congestion unchanged  IMPRESSION: Endotracheal tube is just above the carina.  Bibasilar consolidation and small effusions unchanged. Pulmonary vascular congestion unchanged.   Electronically Signed   By: Franchot Gallo M.D.   On: 08/15/2013 08:38   Dg Chest Port 1 View  08/14/2013   CLINICAL DATA:  Post intubation.  EXAM: PORTABLE CHEST - 1 VIEW  COMPARISON:  08/14/2013  FINDINGS: Endotracheal tube  is roughly 2.1 cm above the carina. Again noted is enlargement of the cardiac silhouette. Enlarged interstitial lung markings are suggestive for edema. There are bibasilar densities which could represent a combination atelectasis and pleural fluid. Left jugular central venous catheter tip is near the central left innominate vein.  IMPRESSION: Endotracheal tube is appropriately positioned above the carina.  Cardiomegaly with evidence for interstitial edema.  Bibasilar densities as described.   Electronically Signed   By: Markus Daft M.D.   On: 08/14/2013 09:46   Dg Chest Port 1 View  08/14/2013   CLINICAL DATA:  Increasing shortness of breath.  EXAM: PORTABLE CHEST - 1 VIEW  COMPARISON:  Chest x-ray from yesterday  FINDINGS: Left IJ catheter in similar position when accounting for distortion by rightward rotation.  Persistent cardiopericardial enlargement. Increased hazy density of the chest, likely bilateral layering effusions. There is pulmonary venous congestion and probable edema. No new asymmetric opacity. No pneumothorax.  IMPRESSION: 1. Pulmonary venous hypertension and probable layering pleural effusions. 2. Bibasilar atelectasis.   Electronically Signed   By: Jorje Guild M.D.   On: 08/14/2013 04:09   Dg Abd Portable 1v  08/14/2013   CLINICAL DATA:  NG placement  EXAM: PORTABLE ABDOMEN - 1 VIEW  COMPARISON:  None.  FINDINGS: The patient is rotated on this study. With technique taking into consideration an NG tube is appreciated coursing in the expected region of the stomach. The tip projects in the expected region of the gastric antrum/duodenal bulb. Air is seen within nondilated loops of small bowel. Degenerative changes are appreciated throughout the thoracolumbar spine.  IMPRESSION: NG tube which appears coarse in expected region of the stomach.   Electronically Signed   By: Margaree Mackintosh M.D.   On: 08/14/2013 15:59    ASSESSMENT / PLAN:  PULMONARY A:  VDRF Bibasilar infiltrates. At risk  for ARDS. MO may impede ventilation Compromised airway secondary to narcotics 3/4 am P:   Plan to continue MV until clarified whether she will need any further debridement/ OR trips.  CARDIOVASCULAR A: Shock (presumed sepsis), resolved EF 123456 grade 2 diastolic dysfunction P:  Hydration Abx as per ID section Lasix and ace-i stopped goal CVP 8-12    RENAL Lab Results  Component Value Date   CREATININE 1.30* 08/15/2013   CREATININE 1.28* 08/14/2013   CREATININE 1.57* 08/13/2013   CREATININE 1.2* 01/29/2013   A:  Acute renal failure, likely ATN Urine Na(26), Cr(95) P:   IVF resuscitation  Follow S Cr, lytes  GASTROINTESTINAL A:  GI protection P:   PPI  HEMATOLOGIC A:  Anemia P:  Transfusion for goal Hgb > 7.0  INFECTIOUS A:  Nec fascitis R labia s/p debridement P:   Vanco, pip/tazo, clinda started 3/2 Consider  d/c clinda after 5 days  Follow cx data Surgical tx per CCS, may need further debridement   ENDOCRINE A:  DM P:   SSI  NEUROLOGIC A:  Sedation P:   Prn pushes fentanyl, versed  TODAY'S SUMMARY:  80 yo obese female (250 lbs) with history of breast cancer, PAF,CVA, DM,GERD, DVT who presented 3/2 with rt labia nec fasciitis. VDRF.    CC time 40 minutes   Baltazar Apo, MD, PhD 08/15/2013, 10:14 AM [facility] [phone] or if no answer [phone]

## 2013-08-15 NOTE — Progress Notes (Signed)
ANTIBIOTIC CONSULT NOTE - Follow Up  Pharmacy Consult for vancomycin Indication: abscess on buttock  Allergies  Allergen Reactions  . Ace Inhibitors Other (See Comments)    Tolerates lisinopril at home. Pt does not recall allergy.  . Atorvastatin     REACTION: myalgias  . Oxycodone-Acetaminophen     REACTION: confusion, fatigue  . Rosuvastatin     REACTION: leg weakness  . Simvastatin     REACTION: leg weakness  . Codeine Nausea And Vomiting and Rash  . Sulfonamide Derivatives Hives and Rash    Patient Measurements: Height: 5\' 2"  (157.5 cm) Weight: 254 lb 3.1 oz (115.3 kg) IBW/kg (Calculated) : 50.1   Vital Signs: Temp: 98.1 F (36.7 C) (03/05 0800) Temp src: Oral (03/05 0800) BP: 152/48 mmHg (03/05 1200) Pulse Rate: 84 (03/05 1200) Intake/Output from previous day: 03/04 0701 - 03/05 0700 In: 4773.1 [I.V.:3462.4; NG/GT:410.7; IV Piggyback:800] Out: 984 [Urine:964; Blood:20] Intake/Output from this shift: Total I/O In: 1060 [I.V.:720; NG/GT:240; IV Piggyback:100] Out: 185 [Urine:185]  Labs:  Recent Labs  08/13/13 0310 08/13/13 1129 08/14/13 0351 08/15/13 0215  WBC 18.2*  --  16.9* 9.2  HGB 11.3*  --  11.7* 10.3*  PLT 161  --  232 189  LABCREA  --  95.6  --   --   CREATININE 1.57*  --  1.28* 1.30*  Estimated CrCl ~ 28 mL/min/72 kg (wt-normalized)   Estimated Creatinine Clearance: 40.8 ml/min (by C-G formula based on Cr of 1.3). No results found for this basename: VANCOTROUGH, VANCOPEAK, VANCORANDOM, GENTTROUGH, GENTPEAK, GENTRANDOM, TOBRATROUGH, TOBRAPEAK, TOBRARND, AMIKACINPEAK, AMIKACINTROU, AMIKACIN,  in the last 72 hours   Assessment: [name] presented to ED with 3 day history of abscess on L buttock along with generalized weakness.  Pharmacy consulted for Vancomycin and Zosyn management.  D#4 vancomycin 2g x 1 then 1500 mg q48h, Zosyn 3.375g IV q8h (4 hour infusion time), Clindamycin 600 mg IV q8h (per MD) for septic shock 2/2 buttock abscess,  necrotizing fasciitis of perineum s/p debridement and drainage.  Further debridement per surgery planned for tomorrow (3/6).  Tmax: AF x 48h WBCs: improved to WNL Renal: SCr 1.30 (improved since admission but didn't continue to trend down today), CrCl ~38 ml/min (normalized)  Microbiology data: 3/2 blood x 2: ngtd 3/2: abscess cx: pending, gram stain showing mod GPC in pairs and few GNR, no anaerobes isolated to date 3/2 MRSA PCR screen: negative  Goal of Therapy:  Appropriate antibiotic dosing for weight and renal function; eradication of infection Vancomycin trough 15-20  Plan:  Obtain a vancomycin trough level prior to steady state this afternoon to ensure we're achieving adequate concentrations.   Hershal Coria, PharmD, BCPS Pager: [phone] 08/15/2013 1:22 PM       Randolm Idol, Estill Bamberg 08/15/2013,1:22 PM

## 2013-08-15 NOTE — Op Note (Signed)
[name], [name]                ACCOUNT NO.:  [id number]  MEDICAL RECORD NO.:  [id number]  LOCATION:  18                         FACILITY:  [facility]  PHYSICIAN:  Earnstine Regal, MD      DATE OF BIRTH:  02-07-1930  DATE OF PROCEDURE:  08/14/2013                               OPERATIVE REPORT   PREOPERATIVE DIAGNOSIS:  Necrotizing fasciitis of perineum.  POSTOPERATIVE DIAGNOSIS:  Necrotizing fasciitis of perineum.  PROCEDURE: 1. Debridement of skin and subcutaneous tissue, right anterior buttock     and perirectal region for necrotizing fasciitis. 2. Dressing change, right groin wound.  SURGEON:  Earnstine Regal, MD, FACS  ANESTHESIA:  General per Dr. Rod Mae  ESTIMATED BLOOD LOSS:  Minimal.  PREPARATION:  Betadine.  COMPLICATIONS:  None.  INDICATIONS:  The patient is an 80 year old female, who presented to the emergency department with signs and symptoms of necrotizing fasciitis. She underwent initial debridement of the perineum on August 12, 2013.  She returns to the operating room for dressing change and further debridement.  BODY OF REPORT:  Procedure was done in OR #6 at the [facility].  The patient was brought to the operating room, placed in a supine position on the operating room table.  Following administration of general anesthesia, the patient was positioned in lithotomy and prepped and draped in the usual aseptic fashion.  Old packings were removed from the wound extending from just to the right of the mons pubis throughout the inguinal crease of the right groin and extending into the right buttock and perirectal region.  All packings were removed.  There was further necrosis of tissue in the perirectal region and right anterior buttock.  This was debrided using the electrocautery for hemostasis.  Wound was then irrigated with warm saline.  Betadine-soaked 4 inch Kerlix gauze packs were then placed in the wound and covered with dry  gauze dressings and ABD pads.  The patient was taken out of lithotomy and returned intubated to the intensive care unit.  The patient remained stable throughout the surgical procedure.   Earnstine Regal, MD, [facility] Office: [phone]    TMG/MEDQ  D:  08/14/2013  T:  08/15/2013  Job:  [id number]

## 2013-08-15 NOTE — Progress Notes (Signed)
Patient ID: [name], female   DOB: [birth year], 80 y.o.   MRN: [id number]  General Surgery - [facility] - Progress Note  POD# 3/1  Subjective: Patient in ICU on vent.  Responds to voice.  Husband at bedside.  Objective: Vital signs in last 24 hours: Temp:  [98.1 F (36.7 C)-98.9 F (37.2 C)] 98.1 F (36.7 C) (03/05 0800) Pulse Rate:  [60-85] 73 (03/05 0800) Resp:  [15-26] 15 (03/05 0800) BP: (91-153)/(25-83) 151/48 mmHg (03/05 0800) SpO2:  [98 %-100 %] 100 % (03/05 0800) FiO2 (%):  [40 %-100 %] 40 % (03/05 0945)    Intake/Output from previous day: 03/04 0701 - 03/05 0700 In: 4773.1 [I.V.:3462.4; NG/GT:410.7; IV Piggyback:800] Out: 984 [Urine:964; Blood:20]  Exam: HEENT - clear, not icteric Neck - soft Abd - soft without distension GU - dressing right groin partially visualized - intact without significant drainage  Lab Results:   Recent Labs  08/14/13 0351 08/15/13 0215  WBC 16.9* 9.2  HGB 11.7* 10.3*  HCT 37.7 33.3*  PLT 232 189     Recent Labs  08/14/13 0351 08/15/13 0215  NA 141 145  K 4.0 4.0  CL 107 113*  CO2 20 21  GLUCOSE 118* 139*  BUN 43* 42*  CREATININE 1.28* 1.30*  CALCIUM 8.4 8.6    Studies/Results: Dg Chest Port 1 View  08/15/2013   CLINICAL DATA:  Respiratory distress  EXAM: PORTABLE CHEST - 1 VIEW  COMPARISON:  08/14/2013  FINDINGS: Endotracheal tube is 1 cm above the carina. NG tube enters the stomach. Central venous catheter tip in the SVC is unchanged.  Bibasilar consolidation unchanged. Small bilateral effusions unchanged. Pulmonary vascular congestion unchanged  IMPRESSION: Endotracheal tube is just above the carina.  Bibasilar consolidation and small effusions unchanged. Pulmonary vascular congestion unchanged.   Electronically Signed   By: Franchot Gallo M.D.   On: 08/15/2013 08:38   Dg Chest Port 1 View  08/14/2013   CLINICAL DATA:  Post intubation.  EXAM: PORTABLE CHEST - 1 VIEW  COMPARISON:  08/14/2013   FINDINGS: Endotracheal tube is roughly 2.1 cm above the carina. Again noted is enlargement of the cardiac silhouette. Enlarged interstitial lung markings are suggestive for edema. There are bibasilar densities which could represent a combination atelectasis and pleural fluid. Left jugular central venous catheter tip is near the central left innominate vein.  IMPRESSION: Endotracheal tube is appropriately positioned above the carina.  Cardiomegaly with evidence for interstitial edema.  Bibasilar densities as described.   Electronically Signed   By: Markus Daft M.D.   On: 08/14/2013 09:46   Dg Chest Port 1 View  08/14/2013   CLINICAL DATA:  Increasing shortness of breath.  EXAM: PORTABLE CHEST - 1 VIEW  COMPARISON:  Chest x-ray from yesterday  FINDINGS: Left IJ catheter in similar position when accounting for distortion by rightward rotation.  Persistent cardiopericardial enlargement. Increased hazy density of the chest, likely bilateral layering effusions. There is pulmonary venous congestion and probable edema. No new asymmetric opacity. No pneumothorax.  IMPRESSION: 1. Pulmonary venous hypertension and probable layering pleural effusions. 2. Bibasilar atelectasis.   Electronically Signed   By: Jorje Guild M.D.   On: 08/14/2013 04:09   Dg Abd Portable 1v  08/14/2013   CLINICAL DATA:  NG placement  EXAM: PORTABLE ABDOMEN - 1 VIEW  COMPARISON:  None.  FINDINGS: The patient is rotated on this study. With technique taking into consideration an NG tube is appreciated coursing in the expected  region of the stomach. The tip projects in the expected region of the gastric antrum/duodenal bulb. Air is seen within nondilated loops of small bowel. Degenerative changes are appreciated throughout the thoracolumbar spine.  IMPRESSION: NG tube which appears coarse in expected region of the stomach.   Electronically Signed   By: Margaree Mackintosh M.D.   On: 08/14/2013 15:59    Assessment / Plan: 1.  Necrotizing fasciitis of  perineum  WBC improved this AM  IV clinda, vanco, zosyn  Plan return to OR for dressing change and further debridement on Friday, 3/6  Discussed with husband at bedside  Discussed with Dr. Londell Moh in [place], MD, [facility] Office: [phone]  08/15/2013

## 2013-08-15 NOTE — Progress Notes (Signed)
PHARMACY BRIEF NOTE:  VANCOMYCIN  D#4 vancomycin 2g x1 then 1500 mg q24h / Zosyn 3.375 grams q8h (4 hr infusion time) / clindamycin 600 mg IV q8h (per MD) for septic shock secondary to buttock abscess with necrotizing fasciitis.  SCr 1.30 (improved), estimated CrCl ~ 40 mL/min WBC 9.2 (improved) off pressors  Vancomycin trough 19.5  (goal range 15-20)  Assessment: Vancomycin trough at goal but was drawn prior to achievement of steady-state.  Anticipate further accumulation of drug to supratherapeutic levels if current dosage is continued.  Plan: 1. Reduce vancomycin to 1000 mg IV q24h, next dose at 6pm. 2. Continue Zosyn and clindamycin at current dosages. 3. Await culture results. 4. Follow serum creatinine, clinical course. 5. Recheck vancomycin trough on new dosage when appropriate based upon clinical course and serum creatinine trend.  Clayburn Pert, PharmD, BCPS Pager: [phone] 08/15/2013  2:24 PM

## 2013-08-15 NOTE — Progress Notes (Signed)
CARE MANAGEMENT NOTE 08/15/2013  Patient:  [name],[name]   Account Number:  [id number]  Date Initiated:  08/15/2013  Documentation initiated by:  Ronal Maybury  Subjective/Objective Assessment:   patient transferred from med surg floor on [id number] after i and d for ncrotizing facsitis of the labia and requiring intubation.     Action/Plan:   tbd based on progress of resp and surg conditions.   Anticipated DC Date:  08/18/2013   Anticipated DC Plan:  [place]  In-house referral  NA      DC Planning Services  NA      [facility] Choice  NA   Choice offered to / List presented to:  NA   DME arranged  NA      DME agency  NA     [place] arranged  NA      [place] agency  NA   Status of service:  In process, will continue to follow Medicare Important Message given?  NA - LOS <3 / Initial given by admissions (If response is "NO", the following Medicare IM given date fields will be blank) Date Medicare IM given:   Date Additional Medicare IM given:    Discharge Disposition:    Per UR Regulation:  Reviewed for med. necessity/level of care/duration of stay  If discussed at [place] of Stay Meetings, dates discussed:    Comments:  [id number]/Dionysios Massman Eldridge Dace, [place], [place] [phone] Chart Reviewed for discharge and hospital needs. Discharge needs at time of review:  None present will follow for needs. Review of patient progress due on [id number]. pod 1/ vent day 1/ icu day 1

## 2013-08-16 ENCOUNTER — Encounter (HOSPITAL_COMMUNITY): Payer: Medicare Other | Admitting: Anesthesiology

## 2013-08-16 ENCOUNTER — Encounter (HOSPITAL_COMMUNITY): Payer: Self-pay | Admitting: Surgery

## 2013-08-16 ENCOUNTER — Inpatient Hospital Stay (HOSPITAL_COMMUNITY): Payer: Medicare Other | Admitting: Anesthesiology

## 2013-08-16 ENCOUNTER — Encounter (HOSPITAL_COMMUNITY)
Admission: EM | Disposition: A | Discharge status: Skilled Nursing Facility | Payer: Self-pay | Source: Home / Self Care | Attending: Emergency Medicine

## 2013-08-16 HISTORY — PX: IRRIGATION AND DEBRIDEMENT ABSCESS: SHX5252

## 2013-08-16 LAB — CBC
HCT: 35.6 % — ABNORMAL LOW (ref 36.0–46.0)
Hemoglobin: 11.2 g/dL — ABNORMAL LOW (ref 12.0–15.0)
MCH: 29.4 pg (ref 26.0–34.0)
MCHC: 31.5 g/dL (ref 30.0–36.0)
MCV: 93.4 fL (ref 78.0–100.0)
Platelets: 210 10*3/uL (ref 150–400)
RBC: 3.81 MIL/uL — ABNORMAL LOW (ref 3.87–5.11)
RDW: 16.6 % — ABNORMAL HIGH (ref 11.5–15.5)
WBC: 10.2 10*3/uL (ref 4.0–10.5)

## 2013-08-16 LAB — CULTURE, ROUTINE-ABSCESS

## 2013-08-16 LAB — GLUCOSE, CAPILLARY
Glucose-Capillary: 103 mg/dL — ABNORMAL HIGH (ref 70–99)
Glucose-Capillary: 108 mg/dL — ABNORMAL HIGH (ref 70–99)
Glucose-Capillary: 121 mg/dL — ABNORMAL HIGH (ref 70–99)
Glucose-Capillary: 125 mg/dL — ABNORMAL HIGH (ref 70–99)
Glucose-Capillary: 99 mg/dL (ref 70–99)

## 2013-08-16 LAB — BASIC METABOLIC PANEL
BUN: 36 mg/dL — ABNORMAL HIGH (ref 6–23)
CO2: 21 mEq/L (ref 19–32)
Calcium: 9 mg/dL (ref 8.4–10.5)
Chloride: 113 mEq/L — ABNORMAL HIGH (ref 96–112)
Creatinine, Ser: 1.18 mg/dL — ABNORMAL HIGH (ref 0.50–1.10)
GFR calc Af Amer: 49 mL/min — ABNORMAL LOW (ref >90)
GFR calc non Af Amer: 42 mL/min — ABNORMAL LOW (ref >90)
Glucose, Bld: 138 mg/dL — ABNORMAL HIGH (ref 70–99)
Potassium: 3.6 mEq/L — ABNORMAL LOW (ref 3.7–5.3)
Sodium: 146 mEq/L (ref 137–147)

## 2013-08-16 SURGERY — IRRIGATION AND DEBRIDEMENT ABSCESS
Anesthesia: General | Site: Perineum

## 2013-08-16 MED ORDER — PROMETHAZINE HCL 25 MG/ML IJ SOLN: 6.2500 mg | INTRAMUSCULAR | Status: DC | PRN

## 2013-08-16 MED ORDER — FENTANYL CITRATE 0.05 MG/ML IJ SOLN
INTRAMUSCULAR | Status: DC | PRN
  Administered 2013-08-16 (×2): 25 ug via INTRAVENOUS

## 2013-08-16 MED ORDER — FENTANYL CITRATE 0.05 MG/ML IJ SOLN
25.0000 ug | INTRAMUSCULAR | Status: DC | PRN
  Filled 2013-08-16 (×3): qty 2

## 2013-08-16 MED ORDER — FENTANYL CITRATE 0.05 MG/ML IJ SOLN
INTRAMUSCULAR | Status: AC
  Filled 2013-08-16: qty 2

## 2013-08-16 MED ORDER — LACTATED RINGERS IV SOLN
INTRAVENOUS | Status: DC | PRN
  Administered 2013-08-16: 10:00:00 via INTRAVENOUS

## 2013-08-16 MED ORDER — 0.9 % SODIUM CHLORIDE (POUR BTL) OPTIME
TOPICAL | Status: DC | PRN
  Administered 2013-08-16: 1000 mL

## 2013-08-16 MED ORDER — FENTANYL CITRATE 0.05 MG/ML IJ SOLN
50.0000 ug | Freq: Once | INTRAMUSCULAR | Status: AC
  Administered 2013-08-16: 50 ug via INTRAVENOUS

## 2013-08-16 SURGICAL SUPPLY — 31 items
BENZOIN TINCTURE PRP APPL 2/3 (GAUZE/BANDAGES/DRESSINGS) IMPLANT
BLADE HEX COATED 2.75 (ELECTRODE) ×2 IMPLANT
BLADE SURG SZ10 CARB STEEL (BLADE) ×2 IMPLANT
CANISTER SUCTION 2500CC (MISCELLANEOUS) IMPLANT
DECANTER SPIKE VIAL GLASS SM (MISCELLANEOUS) IMPLANT
DRAIN CHANNEL RND F F (WOUND CARE) IMPLANT
DRAPE LAPAROTOMY T 102X78X121 (DRAPES) IMPLANT
DRAPE LAPAROTOMY TRNSV 102X78 (DRAPE) IMPLANT
DRAPE LG THREE QUARTER DISP (DRAPES) IMPLANT
ELECT REM PT RETURN 9FT ADLT (ELECTROSURGICAL) ×2
ELECTRODE REM PT RTRN 9FT ADLT (ELECTROSURGICAL) ×1 IMPLANT
EVACUATOR SILICONE 100CC (DRAIN) IMPLANT
GLOVE BIOGEL PI IND STRL 7.0 (GLOVE) ×1 IMPLANT
GLOVE BIOGEL PI INDICATOR 7.0 (GLOVE) ×1
GLOVE SURG ORTHO 8.0 STRL STRW (GLOVE) ×2 IMPLANT
GOWN STRL REUS W/TWL LRG LVL3 (GOWN DISPOSABLE) IMPLANT
GOWN STRL REUS W/TWL XL LVL3 (GOWN DISPOSABLE) ×4 IMPLANT
KIT BASIN OR (CUSTOM PROCEDURE TRAY) ×2 IMPLANT
MARKER SKIN DUAL TIP RULER LAB (MISCELLANEOUS) IMPLANT
NEEDLE HYPO 25X1 1.5 SAFETY (NEEDLE) ×2 IMPLANT
NS IRRIG 1000ML POUR BTL (IV SOLUTION) ×2 IMPLANT
PACK GENERAL/GYN (CUSTOM PROCEDURE TRAY) ×2 IMPLANT
SPONGE GAUZE 4X4 12PLY (GAUZE/BANDAGES/DRESSINGS) ×2 IMPLANT
SPONGE LAP 18X18 X RAY DECT (DISPOSABLE) IMPLANT
STAPLER VISISTAT 35W (STAPLE) IMPLANT
STRIP CLOSURE SKIN 1/2X4 (GAUZE/BANDAGES/DRESSINGS) IMPLANT
SUT ETHILON 3 0 PS 1 (SUTURE) IMPLANT
SUT MNCRL AB 4-0 PS2 18 (SUTURE) IMPLANT
SUT VIC AB 3-0 SH 18 (SUTURE) IMPLANT
SYR CONTROL 10ML LL (SYRINGE) ×2 IMPLANT
TOWEL OR 17X26 10 PK STRL BLUE (TOWEL DISPOSABLE) ×2 IMPLANT

## 2013-08-16 NOTE — Progress Notes (Addendum)
Name: [name] MRN: [id number] DOB: 02/23/1930    ADMISSION DATE:  08/12/2013 CONSULTATION DATE:  3/3  REFERRING MD :  [facility]ad PRIMARY SERVICE: [facility]  CHIEF COMPLAINT:  Hypotension  BRIEF PATIENT DESCRIPTION:  80 yo obese female (250 lbs) with history of breast cancer, PAF,CVA, DM,GERD, DVT. S/p exploration and debridement R labial necrotizing fasciitis. To ICU 3/3 with septic shock.  SIGNIFICANT EVENTS / STUDIES:  3/3 to or for debridement labia 3/4 over sedated with dilaudid and required narcan x 2  3/3 TTE EF 123456 grade 2 diastolic dysfunction 3/6 To OR for further debridement  LINES / TUBES: 3/3  Left rad a line>>3/4 3/3 lt i j cvl>> 3/3 OETT >>  CULTURES: 3/2 wound > gpc, no predominant organism 3/2 Blood >>>  ANTIBIOTICS: 3/2 vanc>> 3/2 pip-tazo>> 3/2 Clinda>>  INTERVAL HISTORY: Remains intubated, to OR today for further debridement.   VITAL SIGNS: Temp:  [97.8 F (36.6 C)-98.4 F (36.9 C)] 98.2 F (36.8 C) (03/06 0408) Pulse Rate:  [50-86] 73 (03/06 0700) Resp:  [9-23] 23 (03/06 0700) BP: (91-190)/(25-134) 167/95 mmHg (03/06 0700) SpO2:  [87 %-100 %] 100 % (03/06 0700) FiO2 (%):  [40 %] 40 % (03/06 0408) Weight:  [127.3 kg (280 lb 10.3 oz)] 127.3 kg (280 lb 10.3 oz) (03/06 0420) HEMODYNAMICS:   VENTILATOR SETTINGS: Vent Mode:  [-] PRVC FiO2 (%):  [40 %] 40 % Set Rate:  [15 bmp] 15 bmp Vt Set:  [500 mL] 500 mL PEEP:  [5 cmH20] 5 cmH20 Pressure Support:  [5 cmH20-10 cmH20] 10 cmH20 Plateau Pressure:  [20 [id number] cmH20] 24 cmH20 INTAKE / OUTPUT: Intake/Output     03/05 0701 - 03/06 0700 03/06 0701 - 03/07 0700   I.V. (mL/kg) 2420 (19)    Other 90    NG/GT 920    IV Piggyback 500    Total Intake(mL/kg) 3930 (30.9)    Urine (mL/kg/hr) 1310 (0.4)    Stool 260 (0.1)    Blood     Total Output 1570     Net +2360          Stool Occurrence 3 x      PHYSICAL EXAMINATION: General:  MOWF Neuro: spontaneously awake, alert, responds to questions  appropriately. HEENT:  Short neck, No JVD/LAN Cardiovascular:  Regular, distant, no M Lungs:  Coarse throughout. Abdomen:  Obese +bs Musculoskeletal:  intact Skin:  Labial dressing CDI, changed this morning  LABS:  CBC  Recent Labs Lab 08/14/13 0351 08/15/13 0215 08/16/13 0605  WBC 16.9* 9.2 10.2  HGB 11.7* 10.3* 11.2*  HCT 37.7 33.3* 35.6*  PLT 232 189 210   Coag's No results found for this basename: APTT, INR,  in the last 168 hours BMET  Recent Labs Lab 08/14/13 0351 08/15/13 0215 08/16/13 0605  NA 141 145 146  K 4.0 4.0 3.6*  CL 107 113* 113*  CO2 20 21 21   BUN 43* 42* 36*  CREATININE 1.28* 1.30* 1.18*  GLUCOSE 118* 139* 138*   Electrolytes  Recent Labs Lab 08/14/13 0351 08/15/13 0215 08/16/13 0605  CALCIUM 8.4 8.6 9.0  MG 1.8 1.7  --   PHOS 3.4 3.1  --    Sepsis Markers  Recent Labs Lab 08/13/13 0315  LATICACIDVEN 0.9   ABG  Recent Labs Lab 08/14/13 0800 08/14/13 1106  PHART 7.111* 7.264*  PCO2ART 69.4* 43.8  PO2ART 149.0* 318.0*   Liver Enzymes No results found for this basename: AST, ALT, ALKPHOS, BILITOT, ALBUMIN,  in the  last 168 hours Cardiac Enzymes No results found for this basename: TROPONINI, PROBNP,  in the last 168 hours Glucose  Recent Labs Lab 08/15/13 0742 08/15/13 1123 08/15/13 1552 08/15/13 1936 08/16/13 0009 08/16/13 0430  GLUCAP 144* 148* 124* 151* 108* 121*    Imaging Dg Chest Port 1 View  08/15/2013   CLINICAL DATA:  Respiratory distress  EXAM: PORTABLE CHEST - 1 VIEW  COMPARISON:  08/14/2013  FINDINGS: Endotracheal tube is 1 cm above the carina. NG tube enters the stomach. Central venous catheter tip in the SVC is unchanged.  Bibasilar consolidation unchanged. Small bilateral effusions unchanged. Pulmonary vascular congestion unchanged  IMPRESSION: Endotracheal tube is just above the carina.  Bibasilar consolidation and small effusions unchanged. Pulmonary vascular congestion unchanged.   Electronically  Signed   By: Franchot Gallo M.D.   On: 08/15/2013 08:38   Dg Chest Port 1 View  08/14/2013   CLINICAL DATA:  Post intubation.  EXAM: PORTABLE CHEST - 1 VIEW  COMPARISON:  08/14/2013  FINDINGS: Endotracheal tube is roughly 2.1 cm above the carina. Again noted is enlargement of the cardiac silhouette. Enlarged interstitial lung markings are suggestive for edema. There are bibasilar densities which could represent a combination atelectasis and pleural fluid. Left jugular central venous catheter tip is near the central left innominate vein.  IMPRESSION: Endotracheal tube is appropriately positioned above the carina.  Cardiomegaly with evidence for interstitial edema.  Bibasilar densities as described.   Electronically Signed   By: Markus Daft M.D.   On: 08/14/2013 09:46   Dg Abd Portable 1v  08/14/2013   CLINICAL DATA:  NG placement  EXAM: PORTABLE ABDOMEN - 1 VIEW  COMPARISON:  None.  FINDINGS: The patient is rotated on this study. With technique taking into consideration an NG tube is appreciated coursing in the expected region of the stomach. The tip projects in the expected region of the gastric antrum/duodenal bulb. Air is seen within nondilated loops of small bowel. Degenerative changes are appreciated throughout the thoracolumbar spine.  IMPRESSION: NG tube which appears coarse in expected region of the stomach.   Electronically Signed   By: Margaree Mackintosh M.D.   On: 08/14/2013 15:59    ASSESSMENT / PLAN:  PULMONARY A:  VDRF Bibasilar infiltrates. At risk for ARDS. MO may impede ventilation (also concern for pulmonary edema on 3/5 film) Compromised airway secondary to narcotics 3/4 am  P:   Plan to continue MV until clarified whether she will need any further debridement/ OR trips. Strict I&O (12.5L positive this admission. Consider diuresis if BP will support after surgery Follow CXR tomorrow  CARDIOVASCULAR A: Shock (presumed sepsis), resolved EF 123456 grade 2 diastolic dysfunction P:   Hydration Abx as per ID section Lasix and ace-i stopped goal CVP 8-12    RENAL Lab Results  Component Value Date   CREATININE 1.18* 08/16/2013   CREATININE 1.30* 08/15/2013   CREATININE 1.28* 08/14/2013   CREATININE 1.2* 01/29/2013   A:  Acute renal failure, likely ATN Urine Na(26), Cr(95) P:   IVF resuscitation  Follow S Cr, lytes  GASTROINTESTINAL A:  GI protection P:   PPI  HEMATOLOGIC A:  Anemia P:  Transfusion for goal Hgb > 7.0  INFECTIOUS A:  Nec fascitis R labia s/p debridement P:   Vanco, pip/tazo, clinda started 3/2 Consider d/c clinda after 5 days  Follow cx data Surgical tx per CCS, may need further debridement   ENDOCRINE A:  DM P:   SSI  NEUROLOGIC A:  Sedation P:   Prn pushes fentanyl, versed  TODAY'S SUMMARY:  She is going to OR today for further debridement of perineum. Remains intubated.   CC time 35 minutes  Georgann Housekeeper, ACNP [facility] Pulmonology/Critical Care Pager [phone] or [phone]   Baltazar Apo, MD, PhD 08/16/2013, 12:49 PM [place] [facility] [phone] or if no answer [phone]

## 2013-08-16 NOTE — Anesthesia Postprocedure Evaluation (Signed)
  Anesthesia Post-op Note  Patient: [name]  Procedure(s) Performed: Procedure(s) (LRB): DRESSING CHANGE AND DEBRIDEMENT OF PERINEUM WOUND (N/A)  Patient Location: PACU  Anesthesia Type: General  Level of Consciousness: sedated  Airway and Oxygen Therapy: mechanically ventilated  Post-op Pain: mild  Post-op Assessment: Post-op Vital signs reviewed, Patient's Cardiovascular Status Stable, Respiratory Function Stable, Taken to ICU intubated and sedated  Last Vitals:  Filed Vitals:   08/16/13 1200  BP:   Pulse:   Temp: 36.4 C  Resp:     Post-op Vital Signs: stable   Complications: No apparent anesthesia complications

## 2013-08-16 NOTE — Progress Notes (Signed)
PULMONARY / CRITICAL CARE MEDICINE  Name: [name] MRN: [id number] DOB: 13-Nov-1929    ADMISSION DATE:  08/12/2013 CONSULTATION DATE:  08/13/2013  REFERRING MD :  [facility] PRIMARY SERVICE: PCCM  CHIEF COMPLAINT:  Hypotension  BRIEF PATIENT DESCRIPTION:  80 yo obese (250 lbs) female admitted 3/2 with R labial necrotizing fasciitis s/p debriedement complicated by septic shock.  SIGNIFICANT EVENTS / STUDIES:  3/3  OR >>> Labia debridement 3/3  TTE EF 123456 grade 2 diastolic dysfunction 3/4  Oversedated with Dilaudid and required Narcan x 2  3/6  OR >>> Further debridement  LINES / TUBES: L rad A line  3/3 >>> 3/4 L IJ CVL 3/3 >>> OETT 3/3 >>> OGT 3/3 >>> Foley 3/2 >>  CULTURES: 3/2  MRSA PCR >>> neg 3/2  Abscess >>> MULTIPLE ORGANISMS 3/2  Blood >>>  ANTIBIOTICS: Vancomycin 3/2 >>> Zosyn 3/2 >>> Clindamycin  3/2 >>>  INTERVAL HISTORY:  No acute events overnight.  VITAL SIGNS: Temp:  [97.5 F (36.4 C)-99.5 F (37.5 C)] 98.5 F (36.9 C) (03/07 0800) Pulse Rate:  [59-94] 75 (03/07 1000) Resp:  [13-23] 21 (03/07 1000) BP: (95-187)/(38-83) 140/53 mmHg (03/07 1000) SpO2:  [96 %-100 %] 100 % (03/07 1000) FiO2 (%):  [40 %] 40 % (03/07 1000) Weight:  [128.9 kg (284 lb 2.8 oz)] 128.9 kg (284 lb 2.8 oz) (03/07 0323)  HEMODYNAMICS:   VENTILATOR SETTINGS: Vent Mode:  [-] PSV;CPAP FiO2 (%):  [40 %] 40 % Set Rate:  [15 bmp] 15 bmp Vt Set:  [500 mL] 500 mL PEEP:  [5 cmH20] 5 cmH20 Pressure Support:  [10 cmH20] 10 cmH20 Plateau Pressure:  [14 cmH20-26 cmH20] 23 cmH20  INTAKE / OUTPUT: Intake/Output     03/06 0701 - 03/07 0700 03/07 0701 - 03/08 0700   I.V. (mL/kg) 2655 (20.6) 400 (3.1)   Other 50    NG/GT 1013.3 60   IV Piggyback 450 100   Total Intake(mL/kg) 4168.3 (32.3) 560 (4.3)   Urine (mL/kg/hr) 1320 (0.4) 250 (0.5)   Stool 350 (0.1)    Blood 25 (0)    Total Output 1695 250   Net +2473.3 +310         PHYSICAL EXAMINATION: General:  Mechanically ventilated,  synchronous Neuro: Awake, responds appropriatelly HEENT:  OETT Cardiovascular:  Regular, no murmurs Lungs:  Bilateral air entry Abdomen:  Obese, bowel sounds present Musculoskeletal:  Moves all extremities Skin:  Surgical dressing clean  LABS:  CBC  Recent Labs Lab 08/15/13 0215 08/16/13 0605 08/17/13 0528  WBC 9.2 10.2 10.9*  HGB 10.3* 11.2* 10.8*  HCT 33.3* 35.6* 34.6*  PLT 189 210 237   Coag's No results found for this basename: APTT, INR,  in the last 168 hours BMET  Recent Labs Lab 08/15/13 0215 08/16/13 0605 08/17/13 0528  NA 145 146 146  K 4.0 3.6* 3.5*  CL 113* 113* 112  CO2 21 21 22   BUN 42* 36* 32*  CREATININE 1.30* 1.18* 1.10  GLUCOSE 139* 138* 129*   Electrolytes  Recent Labs Lab 08/14/13 0351 08/15/13 0215 08/16/13 0605 08/17/13 0528  CALCIUM 8.4 8.6 9.0 8.6  MG 1.8 1.7  --   --   PHOS 3.4 3.1  --   --    Sepsis Markers  Recent Labs Lab 08/13/13 0315  LATICACIDVEN 0.9   ABG  Recent Labs Lab 08/14/13 0800 08/14/13 1106  PHART 7.111* 7.264*  PCO2ART 69.4* 43.8  PO2ART 149.0* 318.0*   Liver Enzymes No results  found for this basename: AST, ALT, ALKPHOS, BILITOT, ALBUMIN,  in the last 168 hours Cardiac Enzymes  Recent Labs Lab 08/17/13 0528  PROBNP 10639.0*   Glucose  Recent Labs Lab 08/16/13 1151 08/16/13 1625 08/16/13 2059 08/16/13 2321 08/17/13 0309 08/17/13 0746  GLUCAP 103* 99 109* 159* 142* 123*   IMAGING:   Dg Chest Port 1 View  08/17/2013   CLINICAL DATA:  Intubation  EXAM: PORTABLE CHEST - 1 VIEW  COMPARISON:  Prior radiograph from 08/15/2013  FINDINGS: Tip of the endotracheal tube is positioned approximately 2 cm above the carina. Left IJ approach system venous catheter is gross stable. Enteric tube courses into the abdomen. Cardiomegaly is unchanged.  Bilateral pleural effusions with associated bibasilar opacities are not significantly changed. Diffuse pulmonary vascular congestion without frank pulmonary  edema is similar. No pneumothorax.  Osseous structures are unchanged.  IMPRESSION: 1. Tip of the endotracheal tube 2 cm above the carina. Otherwise stable support apparatus. 2. Small bilateral pleural effusions with associated bibasilar opacity, which may reflect atelectasis or infiltrate. 3. Stable cardiomegaly with diffuse pulmonary vascular congestion without frank pulmonary edema, similar to prior.   Electronically Signed   By: Jeannine Boga M.D.   On: 08/17/2013 06:36    ASSESSMENT / PLAN:  PULMONARY A:  Acute respiratory failure Atelectasis vs pneumonia P:   Goal pH>7.30, SpO2>92 Continuous mechanical support VAP bundle Daily SBT Trend ABG/CXR Consider extubation when done with OR  CARDIOVASCULAR A:  Septic shock, resolved Chronic diastolic heart failure P:  Goal MAP>65 ASA D/c Neo-Synephrine  RENAL A:   AKI Hypokalemia  P:   Hold Lasix and Lisinopril NS@100  K 20 x 2  GASTROINTESTINAL A:  Nutrition GI Px P:   Protonix TF  HEMATOLOGIC / ONC A:   Anemia VTE Px Hx breast CA P:  Trend CBC Anastrozole  Lovenox   INFECTIOUS A:   Nec fascitis R labia s/p debridement P:   Surgery following Abx / cx as above  ENDOCRINE A:   DM P:   SSI  NEUROLOGIC A:   Sedation Pain P:   D/c Dilaudid D/c Ultram Fentanyl / Versed PRN  I have personally obtained history, examined patient, evaluated and interpreted laboratory and imaging results, reviewed medical records, formulated assessment / plan and placed orders.  CRITICAL CARE:  The patient is critically ill with multiple organ systems failure and requires high complexity decision making for assessment and support, frequent evaluation and titration of therapies, application of advanced monitoring technologies and extensive interpretation of multiple databases. Critical Care Time devoted to patient care services described in this note is 35 minutes.   Doree Fudge, MD Pulmonary and  [place] Pager: [phone]  08/17/2013, 10:47 AM

## 2013-08-16 NOTE — Brief Op Note (Signed)
08/12/2013 - 08/16/2013  11:07 AM  PATIENT:  [name]  80 y.o. female  PRE-OPERATIVE DIAGNOSIS:  NECROTIZING FASCITIS OF PERINEUM  POST-OPERATIVE DIAGNOSIS:  NECROTIZING FASCITIS OF PERINEUM  PROCEDURE:  Procedure(s): DRESSING CHANGE AND DEBRIDEMENT OF PERINEUM WOUND (N/A)  SURGEON:  Surgeon(s) and Role:    * Earnstine Regal, MD - Primary  ANESTHESIA:   general  EBL:     BLOOD ADMINISTERED:none  DRAINS: none   LOCAL MEDICATIONS USED:  NONE  SPECIMEN:  No Specimen and Excision  DISPOSITION OF SPECIMEN:  N/A  COUNTS:  YES  TOURNIQUET:  * No tourniquets in log *  DICTATION: .Other Dictation: Dictation Number [id number]  PLAN OF CARE: Admit to inpatient   PATIENT DISPOSITION:  ICU - intubated and critically ill.   Delay start of Pharmacological VTE agent (>24hrs) due to surgical blood loss or risk of bleeding: yes  Earnstine Regal, MD, [facility] Office: [phone]

## 2013-08-16 NOTE — Anesthesia Preprocedure Evaluation (Addendum)
Anesthesia Evaluation  Patient identified by MRN, date of birth, ID band Patient awake    Reviewed: Allergy & Precautions, H&P , NPO status , Patient's Chart, lab work & pertinent test results  Airway Mallampati: II TM Distance: >3 FB Neck ROM: Full    Dental  (+) Upper Dentures, Lower Dentures   Pulmonary neg pulmonary ROS,  breath sounds clear to auscultation  Pulmonary exam normal       Cardiovascular hypertension, Pt. on medications + Peripheral Vascular Disease and DVT + dysrhythmias Atrial Fibrillation Rhythm:Regular Rate:Normal     Neuro/Psych CVA negative neurological ROS  negative psych ROS   GI/Hepatic negative GI ROS, Neg liver ROS,   Endo/Other  diabetesMorbid obesity  Renal/GU negative Renal ROS  negative genitourinary   Musculoskeletal negative musculoskeletal ROS (+)   Abdominal (+) + obese,   Peds negative pediatric ROS (+)  Hematology negative hematology ROS (+)   Anesthesia Other Findings   Reproductive/Obstetrics negative OB ROS                          Anesthesia Physical Anesthesia Plan  ASA: III  Anesthesia Plan: General   Post-op Pain Management:    Induction: Inhalational  Airway Management Planned: Oral ETT  Additional Equipment:   Intra-op Plan:   Post-operative Plan: Post-operative intubation/ventilation  Informed Consent: I have reviewed the patients History and Physical, chart, labs and discussed the procedure including the risks, benefits and alternatives for the proposed anesthesia with the patient or authorized representative who has indicated his/her understanding and acceptance.   Dental advisory given  Plan Discussed with: CRNA and Surgeon  Anesthesia Plan Comments: (Patient will return to ICU intubated )       Anesthesia Quick Evaluation

## 2013-08-16 NOTE — Transfer of Care (Signed)
Immediate Anesthesia Transfer of Care Note  Patient: [name]  Procedure(s) Performed: Procedure(s): DRESSING CHANGE AND DEBRIDEMENT OF PERINEUM WOUND (N/A)  Patient Location: ICU  Anesthesia Type:General  Level of Consciousness: sedated  Airway & Oxygen Therapy: Patient remains intubated per anesthesia plan and Patient placed on Ventilator (see vital sign flow sheet for setting)  Post-op Assessment: Report given to PACU RN and Post -op Vital signs reviewed and stable  Post vital signs: Reviewed and stable  Complications: No apparent anesthesia complications

## 2013-08-16 NOTE — Therapy (Signed)
Pt transported to OR with ambu bag and 100% oxygen.

## 2013-08-17 ENCOUNTER — Inpatient Hospital Stay (HOSPITAL_COMMUNITY): Payer: Medicare Other

## 2013-08-17 DIAGNOSIS — J9600 Acute respiratory failure, unspecified whether with hypoxia or hypercapnia: Secondary | ICD-10-CM

## 2013-08-17 DIAGNOSIS — A419 Sepsis, unspecified organism: Secondary | ICD-10-CM

## 2013-08-17 LAB — CBC
HCT: 34.6 % — ABNORMAL LOW (ref 36.0–46.0)
Hemoglobin: 10.8 g/dL — ABNORMAL LOW (ref 12.0–15.0)
MCH: 29.3 pg (ref 26.0–34.0)
MCHC: 31.2 g/dL (ref 30.0–36.0)
MCV: 94 fL (ref 78.0–100.0)
Platelets: 237 10*3/uL (ref 150–400)
RBC: 3.68 MIL/uL — ABNORMAL LOW (ref 3.87–5.11)
RDW: 16.6 % — ABNORMAL HIGH (ref 11.5–15.5)
WBC: 10.9 10*3/uL — ABNORMAL HIGH (ref 4.0–10.5)

## 2013-08-17 LAB — GLUCOSE, CAPILLARY
Glucose-Capillary: 109 mg/dL — ABNORMAL HIGH (ref 70–99)
Glucose-Capillary: 159 mg/dL — ABNORMAL HIGH (ref 70–99)
Glucose-Capillary: 142 mg/dL — ABNORMAL HIGH (ref 70–99)
Glucose-Capillary: 123 mg/dL — ABNORMAL HIGH (ref 70–99)
Glucose-Capillary: 131 mg/dL — ABNORMAL HIGH (ref 70–99)
Glucose-Capillary: 124 mg/dL — ABNORMAL HIGH (ref 70–99)
Glucose-Capillary: 125 mg/dL — ABNORMAL HIGH (ref 70–99)

## 2013-08-17 LAB — BASIC METABOLIC PANEL
BUN: 32 mg/dL — ABNORMAL HIGH (ref 6–23)
CO2: 22 mEq/L (ref 19–32)
Calcium: 8.6 mg/dL (ref 8.4–10.5)
Chloride: 112 mEq/L (ref 96–112)
Creatinine, Ser: 1.1 mg/dL (ref 0.50–1.10)
GFR calc Af Amer: 53 mL/min — ABNORMAL LOW (ref >90)
GFR calc non Af Amer: 46 mL/min — ABNORMAL LOW (ref >90)
Glucose, Bld: 129 mg/dL — ABNORMAL HIGH (ref 70–99)
Potassium: 3.5 mEq/L — ABNORMAL LOW (ref 3.7–5.3)
Sodium: 146 mEq/L (ref 137–147)

## 2013-08-17 LAB — PRO B NATRIURETIC PEPTIDE: Pro B Natriuretic peptide (BNP): 10639 pg/mL — ABNORMAL HIGH (ref 0–450)

## 2013-08-17 LAB — ANAEROBIC CULTURE

## 2013-08-17 MED ORDER — POTASSIUM CHLORIDE 20 MEQ/15ML (10%) PO LIQD
20.0000 meq | ORAL | Status: AC
  Administered 2013-08-17 (×2): 20 meq
  Filled 2013-08-17 (×2): qty 15

## 2013-08-17 NOTE — Progress Notes (Signed)
1 Day Post-Op  Subjective: Awake on vent.  Husband in room.  Objective: Vital signs in last 24 hours: Temp:  [98.1 F (36.7 C)-99.5 F (37.5 C)] 98.5 F (36.9 C) (03/07 0800) Pulse Rate:  [59-86] 75 (03/07 1000) Resp:  [13-23] 21 (03/07 1000) BP: (95-187)/(38-83) 140/53 mmHg (03/07 1000) SpO2:  [96 %-100 %] 100 % (03/07 1000) FiO2 (%):  [40 %] 40 % (03/07 1000) Weight:  [284 lb 2.8 oz (128.9 kg)] 284 lb 2.8 oz (128.9 kg) (03/07 0323) Last BM Date: 08/16/13  Intake/Output from previous day: 03/06 0701 - 03/07 0700 In: 4168.3 [I.V.:2655; NG/GT:1013.3; IV Piggyback:450] Out: 1695 [Urine:1320; Stool:350; Blood:25] Intake/Output this shift: Total I/O In: 560 [I.V.:400; NG/GT:60; IV Piggyback:100] Out: 250 [Urine:250]  PE: General- In NAD GU-dressing on and dry  Lab Results:   Recent Labs  08/16/13 0605 08/17/13 0528  WBC 10.2 10.9*  HGB 11.2* 10.8*  HCT 35.6* 34.6*  PLT 210 237   BMET  Recent Labs  08/16/13 0605 08/17/13 0528  NA 146 146  K 3.6* 3.5*  CL 113* 112  CO2 21 22  GLUCOSE 138* 129*  BUN 36* 32*  CREATININE 1.18* 1.10  CALCIUM 9.0 8.6   PT/INR No results found for this basename: LABPROT, INR,  in the last 72 hours Comprehensive Metabolic Panel:    Component Value Date/Time   NA 146 08/17/2013 0528   NA 146 08/16/2013 0605   NA 145 01/29/2013 1411   K 3.5* 08/17/2013 0528   K 3.6* 08/16/2013 0605   K 4.1 01/29/2013 1411   CL 112 08/17/2013 0528   CL 113* 08/16/2013 0605   CO2 22 08/17/2013 0528   CO2 21 08/16/2013 0605   CO2 27 01/29/2013 1411   BUN 32* 08/17/2013 0528   BUN 36* 08/16/2013 0605   BUN 32.9* 01/29/2013 1411   CREATININE 1.10 08/17/2013 0528   CREATININE 1.18* 08/16/2013 0605   CREATININE 1.2* 01/29/2013 1411   GLUCOSE 129* 08/17/2013 0528   GLUCOSE 138* 08/16/2013 0605   GLUCOSE 166* 01/29/2013 1411   CALCIUM 8.6 08/17/2013 0528   CALCIUM 9.0 08/16/2013 0605   CALCIUM 9.3 01/29/2013 1411   AST 49* 01/29/2013 1411   AST 44* 04/17/2012 1451   AST 28  12/19/2011 1345   ALT 37 01/29/2013 1411   ALT 29 04/17/2012 1451   ALT 20 12/19/2011 1345   ALKPHOS 104 01/29/2013 1411   ALKPHOS 90 04/17/2012 1451   ALKPHOS 102 12/19/2011 1345   BILITOT 0.68 01/29/2013 1411   BILITOT 0.8 04/17/2012 1451   BILITOT 0.5 12/19/2011 1345   PROT 7.1 01/29/2013 1411   PROT 7.4 04/17/2012 1451   PROT 7.3 12/19/2011 1345   ALBUMIN 3.1* 01/29/2013 1411   ALBUMIN 3.6 04/17/2012 1451   ALBUMIN 3.3* 12/19/2011 1345     Studies/Results: Dg Chest Port 1 View  08/17/2013   CLINICAL DATA:  Intubation  EXAM: PORTABLE CHEST - 1 VIEW  COMPARISON:  Prior radiograph from 08/15/2013  FINDINGS: Tip of the endotracheal tube is positioned approximately 2 cm above the carina. Left IJ approach system venous catheter is gross stable. Enteric tube courses into the abdomen. Cardiomegaly is unchanged.  Bilateral pleural effusions with associated bibasilar opacities are not significantly changed. Diffuse pulmonary vascular congestion without frank pulmonary edema is similar. No pneumothorax.  Osseous structures are unchanged.  IMPRESSION: 1. Tip of the endotracheal tube 2 cm above the carina. Otherwise stable support apparatus. 2. Small bilateral pleural effusions with associated bibasilar opacity,  which may reflect atelectasis or infiltrate. 3. Stable cardiomegaly with diffuse pulmonary vascular congestion without frank pulmonary edema, similar to prior.   Electronically Signed   By: Jeannine Boga M.D.   On: 08/17/2013 06:36    Anti-infectives: Anti-infectives   Start     Dose/Rate Route Frequency Ordered Stop   08/15/13 1800  vancomycin (VANCOCIN) IVPB 1000 mg/200 mL premix     1,000 mg 200 mL/hr over 60 Minutes Intravenous Every 24 hours 08/15/13 1414     08/14/13 1400  vancomycin (VANCOCIN) 1,500 mg in sodium chloride 0.9 % 500 mL IVPB  Status:  Discontinued     1,500 mg 250 mL/hr over 120 Minutes Intravenous Every 48 hours 08/12/13 1501 08/13/13 1032   08/13/13 1400  vancomycin (VANCOCIN)  1,500 mg in sodium chloride 0.9 % 500 mL IVPB  Status:  Discontinued     1,500 mg 250 mL/hr over 120 Minutes Intravenous Every 24 hours 08/13/13 1032 08/15/13 1411   08/12/13 1800  piperacillin-tazobactam (ZOSYN) IVPB 3.375 g     3.375 g 12.5 mL/hr over 240 Minutes Intravenous Every 8 hours 08/12/13 1737     08/12/13 1730  clindamycin (CLEOCIN) IVPB 600 mg     600 mg 100 mL/hr over 30 Minutes Intravenous 3 times per day 08/12/13 1729     08/12/13 1445  vancomycin (VANCOCIN) 2,000 mg in sodium chloride 0.9 % 500 mL IVPB     2,000 mg 250 mL/hr over 120 Minutes Intravenous STAT 08/12/13 1432 08/13/13 1900      Assessment Principal Problem:   Necrotizing fasciitis of perineum s/p multiple debridements and dressing changes in OR. Active Problems:   DIABETES MELLITUS, TYPE II      LOS: 5 days   Plan: Dressing change in OR tomorrow or Monday.  Will assess tomorrow AM.   Odis Hollingshead 08/17/2013

## 2013-08-17 NOTE — Op Note (Signed)
[name], [name]                ACCOUNT NO.:  [id number]  MEDICAL RECORD NO.:  [id number]  LOCATION:  59                         FACILITY:  [facility]  PHYSICIAN:  Earnstine Regal, MD      DATE OF BIRTH:  1930/05/11  DATE OF PROCEDURE:  08/16/2013                               OPERATIVE REPORT   PREOPERATIVE DIAGNOSIS:  Necrotizing fasciitis of perineum.  POSTOPERATIVE DIAGNOSIS:  Necrotizing fasciitis of perineum.  PROCEDURE: 1. Debridement of skin and subcutaneous tissues right groin and     perirectal region. 2. Dressing change under anesthesia.  SURGEON:  Earnstine Regal, MD, FACS  ANESTHESIA:  General.  ESTIMATED BLOOD LOSS:  Minimal.  PREPARATION:  Betadine.  COMPLICATIONS:  None.  INDICATIONS:  The patient is an 80 year old female being treated for necrotizing fasciitis of the perineum.  She underwent initial debridement on August 12, 2013.  She returned to the operating room for further debridement and dressing change on August 14, 2013.  She returned to the operating room today for dressing change and further debridement.  BODY OF REPORT:  Procedure was done in OR #6 at the [facility].  The patient was brought to the operating room, placed in supine position on the operating room table.  Following administration of general anesthesia, the patient was placed in lithotomy and prepped and draped in the usual aseptic fashion.  After ascertaining that an adequate level of anesthesia had been achieved, the packing was removed from the right inguinal wound in the perirectal region.  Wound was inspected and irrigated.  There was necrotic tissue present in the perirectal region, which was grasped with an Allis clamp and excised with the electrocautery.  Hemostasis was achieved with the electrocautery.  Inspection of the surrounding skin does not show any extension of the infectious or necrosis beyond the existing wound.  The subcutaneous tissue and skin was  debrided from the posterolateral portion of the wound on the right buttock.  There was purulent drainage. There was necrotic tissue.  Further debridement was also performed on the medial aspect of the wound at the level of the right labia.  Again, skin and subcutaneous tissue was excised due to infection and necrosis. Hemostasis was achieved with the electrocautery. Wound was then packed with 4 inch Kerlix gauze saturated with Betadine solution.  Two 4 inch Kerlix gauzes were completely placed within the wound cavity.  Dry gauze dressings and ABD pads were placed.  The patient was taken out of lithotomy.  She was transported back to the intensive care unit.  The patient tolerated the procedure well.   Earnstine Regal, MD, [facility] Office: [phone]    TMG/MEDQ  D:  08/16/2013  T:  08/16/2013  Job:  [id number]

## 2013-08-18 ENCOUNTER — Inpatient Hospital Stay (HOSPITAL_COMMUNITY): Payer: Medicare Other

## 2013-08-18 LAB — BASIC METABOLIC PANEL
BUN: 30 mg/dL — ABNORMAL HIGH (ref 6–23)
CO2: 22 mEq/L (ref 19–32)
Calcium: 8.6 mg/dL (ref 8.4–10.5)
Chloride: 115 mEq/L — ABNORMAL HIGH (ref 96–112)
Creatinine, Ser: 1.08 mg/dL (ref 0.50–1.10)
GFR calc Af Amer: 54 mL/min — ABNORMAL LOW (ref >90)
GFR calc non Af Amer: 47 mL/min — ABNORMAL LOW (ref >90)
Glucose, Bld: 117 mg/dL — ABNORMAL HIGH (ref 70–99)
Potassium: 3.8 mEq/L (ref 3.7–5.3)
Sodium: 147 mEq/L (ref 137–147)

## 2013-08-18 LAB — CBC
HCT: 31.9 % — ABNORMAL LOW (ref 36.0–46.0)
Hemoglobin: 10 g/dL — ABNORMAL LOW (ref 12.0–15.0)
MCH: 29.2 pg (ref 26.0–34.0)
MCHC: 31.3 g/dL (ref 30.0–36.0)
MCV: 93.3 fL (ref 78.0–100.0)
Platelets: 236 10*3/uL (ref 150–400)
RBC: 3.42 MIL/uL — ABNORMAL LOW (ref 3.87–5.11)
RDW: 16.9 % — ABNORMAL HIGH (ref 11.5–15.5)
WBC: 9 10*3/uL (ref 4.0–10.5)

## 2013-08-18 LAB — GLUCOSE, CAPILLARY
Glucose-Capillary: 124 mg/dL — ABNORMAL HIGH (ref 70–99)
Glucose-Capillary: 111 mg/dL — ABNORMAL HIGH (ref 70–99)
Glucose-Capillary: 112 mg/dL — ABNORMAL HIGH (ref 70–99)
Glucose-Capillary: 121 mg/dL — ABNORMAL HIGH (ref 70–99)
Glucose-Capillary: 117 mg/dL — ABNORMAL HIGH (ref 70–99)
Glucose-Capillary: 131 mg/dL — ABNORMAL HIGH (ref 70–99)

## 2013-08-18 LAB — VANCOMYCIN, TROUGH: Vancomycin Tr: 17.7 ug/mL (ref 10.0–20.0)

## 2013-08-18 MED ORDER — POTASSIUM CHLORIDE 20 MEQ/15ML (10%) PO LIQD
20.0000 meq | Freq: Once | ORAL | Status: AC
  Administered 2013-08-18: 20 meq
  Filled 2013-08-18: qty 15

## 2013-08-18 MED ORDER — MIDAZOLAM HCL 2 MG/2ML IJ SOLN
2.0000 mg | Freq: Once | INTRAMUSCULAR | Status: AC
Start: 2013-08-18 — End: 2013-08-18
  Administered 2013-08-18: 2 mg via INTRAVENOUS

## 2013-08-18 MED ORDER — PANTOPRAZOLE SODIUM 40 MG PO PACK
40.0000 mg | PACK | Freq: Every day | ORAL | Status: DC
  Administered 2013-08-19 – 2013-08-21 (×3): 40 mg
  Filled 2013-08-18 (×6): qty 20

## 2013-08-18 MED ORDER — FENTANYL CITRATE 0.05 MG/ML IJ SOLN
100.0000 ug | Freq: Once | INTRAMUSCULAR | Status: AC
  Administered 2013-08-18: 100 ug via INTRAVENOUS

## 2013-08-18 NOTE — Progress Notes (Addendum)
ANTIBIOTIC CONSULT NOTE - Follow Up  Pharmacy Consult for vancomycin and zosyn Indication: necrotizing fasciitis of buttock  Allergies  Allergen Reactions  . Ace Inhibitors Other (See Comments)    Tolerates lisinopril at home. Pt does not recall allergy.  . Atorvastatin     REACTION: myalgias  . Oxycodone-Acetaminophen     REACTION: confusion, fatigue  . Rosuvastatin     REACTION: leg weakness  . Simvastatin     REACTION: leg weakness  . Codeine Nausea And Vomiting and Rash  . Sulfonamide Derivatives Hives and Rash    Patient Measurements: Height: 5\' 2"  (157.5 cm) Weight: 288 lb 12.8 oz (131 kg) IBW/kg (Calculated) : 50.1   Vital Signs: Temp: 98.4 F (36.9 C) (03/08 0800) Temp src: Oral (03/08 0800) BP: 171/56 mmHg (03/08 1215) Pulse Rate: 94 (03/08 1215) Intake/Output from previous day: 03/07 0701 - 03/08 0700 In: 3375.3 [I.V.:2300; NG/GT:725.3; IV Piggyback:300] Out: 1175 [Urine:1075; Stool:100] Intake/Output from this shift: Total I/O In: 230 [I.V.:200; NG/GT:30] Out: 325 [Urine:325]  Labs:  Recent Labs  08/16/13 0605 08/17/13 0528 08/18/13 0615  WBC 10.2 10.9* 9.0  HGB 11.2* 10.8* 10.0*  PLT 210 237 236  CREATININE 1.18* 1.10 1.08  Estimated CrCl ~ 45 mL/min/72 kg (wt-normalized)   Estimated Creatinine Clearance: 53.2 ml/min (by C-G formula based on Cr of 1.08). No results found for this basename: VANCOTROUGH, Corlis Leak, VANCORANDOM, GENTTROUGH, GENTPEAK, GENTRANDOM, TOBRATROUGH, TOBRAPEAK, TOBRARND, AMIKACINPEAK, AMIKACINTROU, AMIKACIN,  in the last 72 hours   Microbiology: 3/2 blood x 2: ngtd 3/2: abscess cx: pending, gram stain showing mod GPC in pairs and few GNR, no anaerobes isolated to date 3/2 MRSA PCR screen: negative  Assessment: [name] presented to ED with 3 day history of abscess on L buttock along with generalized weakness.  Pharmacy consulted for Vancomycin and Zosyn management, MD dosing clindamycin for septic shock 2/2 buttock  abscess, necrotizing fasciitis of perineum s/p multiple debridements and drainage.   Day #7 Vancomcyin / Zosyn / Clindamycin  Tmax: 99.5 ~24hr ago, currently Afeb  WBCs: improved to WNL  AKI, SCr now improving daily off ACEI and lasix, 1.08 today  Vancomycin levels and dose changes: 3/2: Vanc 2g in ED, then 1500mg  q48h 3/3: Increase Vanc to 1500 mg q24h for improved SCr 3/5: Trough = 19.5 mcg/ml on 1500 mg q24h (not at steady state but wanted to make sure achieving adequate conc in obese pt - reduced 1g q24h. 3/8: VT at 17:30 = ____mg/ml   Goal of Therapy:  Vancomycin trough 15-20 mcg/ml Doses appropriate for renal function  Plan:   Check Vancomycin trough today at 17:30 before 4th dose of new regimen since SCr has improved since dosage decreased on 3/5  Continue Zosyn 3.375gm IV q8h (4hr extended infusions)  Continue Clindamycin 600mg  IV q8h per MD Follow up renal function & cultures   Peggyann Juba, PharmD, BCPS Pager: [phone] 08/18/2013 2:09 PM   Addendum: Trough = 17.7 mcg/ml which is within goal. Continue 1g q24h and continue to follow renal function.  Peggyann Juba, PharmD, BCPS 08/18/2013 5:36 PM

## 2013-08-18 NOTE — Progress Notes (Signed)
2 Days Post-Op  Subjective: Awake on vent.  Husband in room.  Objective: Vital signs in last 24 hours: Temp:  [97.5 F (36.4 C)-98.4 F (36.9 C)] 98.4 F (36.9 C) (03/08 0400) Pulse Rate:  [41-75] 71 (03/08 0800) Resp:  [15-32] 17 (03/08 0800) BP: (94-166)/(28-110) 130/97 mmHg (03/08 0800) SpO2:  [95 %-100 %] 100 % (03/08 0800) FiO2 (%):  [40 %] 40 % (03/08 0800) Weight:  [288 lb 12.8 oz (131 kg)] 288 lb 12.8 oz (131 kg) (03/08 0400) Last BM Date: 08/16/13  Intake/Output from previous day: 03/07 0701 - 03/08 0700 In: 3375.3 [I.V.:2300; NG/GT:725.3; IV Piggyback:300] Out: 1175 [Urine:1075; Stool:100] Intake/Output this shift: Total I/O In: 230 [I.V.:200; NG/GT:30] Out: -   PE: General- In NAD GU-dressing removed after she was sedated, small amount of stool on dressing, small amount of necrotic tissue near skin edge was debrided, wound redressed, she tolerated this well at the bedside with sedation  Lab Results:   Recent Labs  08/17/13 0528 08/18/13 0615  WBC 10.9* 9.0  HGB 10.8* 10.0*  HCT 34.6* 31.9*  PLT 237 236   BMET  Recent Labs  08/17/13 0528 08/18/13 0615  NA 146 147  K 3.5* 3.8  CL 112 115*  CO2 22 22  GLUCOSE 129* 117*  BUN 32* 30*  CREATININE 1.10 1.08  CALCIUM 8.6 8.6   PT/INR No results found for this basename: LABPROT, INR,  in the last 72 hours Comprehensive Metabolic Panel:    Component Value Date/Time   NA 147 08/18/2013 0615   NA 146 08/17/2013 0528   NA 145 01/29/2013 1411   K 3.8 08/18/2013 0615   K 3.5* 08/17/2013 0528   K 4.1 01/29/2013 1411   CL 115* 08/18/2013 0615   CL 112 08/17/2013 0528   CO2 22 08/18/2013 0615   CO2 22 08/17/2013 0528   CO2 27 01/29/2013 1411   BUN 30* 08/18/2013 0615   BUN 32* 08/17/2013 0528   BUN 32.9* 01/29/2013 1411   CREATININE 1.08 08/18/2013 0615   CREATININE 1.10 08/17/2013 0528   CREATININE 1.2* 01/29/2013 1411   GLUCOSE 117* 08/18/2013 0615   GLUCOSE 129* 08/17/2013 0528   GLUCOSE 166* 01/29/2013 1411   CALCIUM 8.6  08/18/2013 0615   CALCIUM 8.6 08/17/2013 0528   CALCIUM 9.3 01/29/2013 1411   AST 49* 01/29/2013 1411   AST 44* 04/17/2012 1451   AST 28 12/19/2011 1345   ALT 37 01/29/2013 1411   ALT 29 04/17/2012 1451   ALT 20 12/19/2011 1345   ALKPHOS 104 01/29/2013 1411   ALKPHOS 90 04/17/2012 1451   ALKPHOS 102 12/19/2011 1345   BILITOT 0.68 01/29/2013 1411   BILITOT 0.8 04/17/2012 1451   BILITOT 0.5 12/19/2011 1345   PROT 7.1 01/29/2013 1411   PROT 7.4 04/17/2012 1451   PROT 7.3 12/19/2011 1345   ALBUMIN 3.1* 01/29/2013 1411   ALBUMIN 3.6 04/17/2012 1451   ALBUMIN 3.3* 12/19/2011 1345     Studies/Results: Dg Chest Port 1 View  08/18/2013   CLINICAL DATA:  Endotracheal tube placement  EXAM: PORTABLE CHEST - 1 VIEW  COMPARISON:  Prior radiograph from 08/17/2013  FINDINGS: Tip of the endotracheal tube is positioned approximately 1.7 cm above the carina. Enteric tube courses into the abdomen. Left IJ approach central venous catheter is stable in position. The catheter is coiled near its insertion site in the lower left neck.  Cardiomegaly is stable. There has been slight interval worsening in diffuse pulmonary vascular congestion with  indistinctness of the interstitial markings, suggestive of progressive edema. Small moderate bilateral pleural effusions with associated bibasilar opacities are similar. No pneumothorax. No definite new focal infiltrate.  Osseous structures are unchanged.  IMPRESSION: 1. Tip of the endotracheal tube 1.7 cm above the carina. 2. Slight interval worsening in diffuse pulmonary vascular congestion with indistinctness of the interstitial markings, suggesting worsened edema. 3. Similar bilateral pleural effusions with associated bibasilar opacities, likely atelectasis.   Electronically Signed   By: Jeannine Boga M.D.   On: 08/18/2013 06:18   Dg Chest Port 1 View  08/17/2013   CLINICAL DATA:  Intubation  EXAM: PORTABLE CHEST - 1 VIEW  COMPARISON:  Prior radiograph from 08/15/2013  FINDINGS: Tip of the  endotracheal tube is positioned approximately 2 cm above the carina. Left IJ approach system venous catheter is gross stable. Enteric tube courses into the abdomen. Cardiomegaly is unchanged.  Bilateral pleural effusions with associated bibasilar opacities are not significantly changed. Diffuse pulmonary vascular congestion without frank pulmonary edema is similar. No pneumothorax.  Osseous structures are unchanged.  IMPRESSION: 1. Tip of the endotracheal tube 2 cm above the carina. Otherwise stable support apparatus. 2. Small bilateral pleural effusions with associated bibasilar opacity, which may reflect atelectasis or infiltrate. 3. Stable cardiomegaly with diffuse pulmonary vascular congestion without frank pulmonary edema, similar to prior.   Electronically Signed   By: Jeannine Boga M.D.   On: 08/17/2013 06:36    Anti-infectives: Anti-infectives   Start     Dose/Rate Route Frequency Ordered Stop   08/15/13 1800  vancomycin (VANCOCIN) IVPB 1000 mg/200 mL premix     1,000 mg 200 mL/hr over 60 Minutes Intravenous Every 24 hours 08/15/13 1414     08/14/13 1400  vancomycin (VANCOCIN) 1,500 mg in sodium chloride 0.9 % 500 mL IVPB  Status:  Discontinued     1,500 mg 250 mL/hr over 120 Minutes Intravenous Every 48 hours 08/12/13 1501 08/13/13 1032   08/13/13 1400  vancomycin (VANCOCIN) 1,500 mg in sodium chloride 0.9 % 500 mL IVPB  Status:  Discontinued     1,500 mg 250 mL/hr over 120 Minutes Intravenous Every 24 hours 08/13/13 1032 08/15/13 1411   08/12/13 1800  piperacillin-tazobactam (ZOSYN) IVPB 3.375 g     3.375 g 12.5 mL/hr over 240 Minutes Intravenous Every 8 hours 08/12/13 1737     08/12/13 1730  clindamycin (CLEOCIN) IVPB 600 mg     600 mg 100 mL/hr over 30 Minutes Intravenous 3 times per day 08/12/13 1729     08/12/13 1445  vancomycin (VANCOCIN) 2,000 mg in sodium chloride 0.9 % 500 mL IVPB     2,000 mg 250 mL/hr over 120 Minutes Intravenous STAT 08/12/13 1432 08/13/13 1900       Assessment Principal Problem:   Necrotizing fasciitis of perineum s/p multiple debridements and dressing changes in OR-able to change wound at bedside with heavy sedation on the ventilator, minimal debridement done, stool contamination of wound may become an issue necessitating a diverting colostomy. Active Problems:   DIABETES MELLITUS, TYPE II      LOS: 6 days   Plan: Daily or BID dressing changes at the bedside.  Leave intubated for at least one more day or until lighter sedation is needed for dressing change.   [name] J 08/18/2013

## 2013-08-18 NOTE — Progress Notes (Signed)
PULMONARY / CRITICAL CARE MEDICINE  Name: [name] MRN: [id number] DOB: 1929-12-20    ADMISSION DATE:  08/12/2013 CONSULTATION DATE:  08/13/2013  REFERRING MD :  [facility] PRIMARY SERVICE: PCCM  CHIEF COMPLAINT:  Hypotension  BRIEF PATIENT DESCRIPTION:  80 yo obese (250 lbs) female admitted 3/2 with R labial necrotizing fasciitis s/p debriedement complicated by septic shock.  SIGNIFICANT EVENTS / STUDIES:  3/3  OR >>> Labia debridement 3/3  TTE EF 123456 grade 2 diastolic dysfunction 3/4  Oversedated with Dilaudid and required Narcan x 2  3/6  OR >>> Further debridement 3/8  Debrided at bedside, apneic with minimal sedation ( Fentanyl 100 Versed 2 )   LINES / TUBES: L rad A line  3/3 >>> 3/4 L IJ CVL 3/3 >>> OETT 3/3 >>> OGT 3/3 >>> Foley 3/2 >>  CULTURES: 3/2  MRSA PCR >>> neg 3/2  Abscess >>> MULTIPLE ORGANISMS 3/2  Blood >>>  ANTIBIOTICS: Vancomycin 3/2 >>> Zosyn 3/2 >>> Clindamycin  3/2 >>>  INTERVAL HISTORY:  Debrided at bedside.  VITAL SIGNS: Temp:  [97.5 F (36.4 C)-98.4 F (36.9 C)] 98.4 F (36.9 C) (03/08 0800) Pulse Rate:  [41-72] 71 (03/08 0800) Resp:  [15-32] 17 (03/08 0800) BP: (94-166)/(28-110) 130/97 mmHg (03/08 0800) SpO2:  [96 %-100 %] 100 % (03/08 0800) FiO2 (%):  [40 %] 40 % (03/08 0845) Weight:  [131 kg (288 lb 12.8 oz)] 131 kg (288 lb 12.8 oz) (03/08 0400)  HEMODYNAMICS:   VENTILATOR SETTINGS: Vent Mode:  [-] PRVC FiO2 (%):  [40 %] 40 % Set Rate:  [15 bmp] 15 bmp Vt Set:  [500 mL] 500 mL PEEP:  [5 cmH20] 5 cmH20 Pressure Support:  [10 cmH20] 10 cmH20 Plateau Pressure:  [21 cmH20-28 cmH20] 28 cmH20  INTAKE / OUTPUT: Intake/Output     03/07 0701 - 03/08 0700 03/08 0701 - 03/09 0700   I.V. (mL/kg) 2300 (17.6) 200 (1.5)   Other 50    NG/GT 725.3 30   IV Piggyback 300    Total Intake(mL/kg) 3375.3 (25.8) 230 (1.8)   Urine (mL/kg/hr) 1075 (0.3) 175 (0.3)   Stool 100 (0)    Blood     Total Output 1175 175   Net +2200.3 +55          PHYSICAL EXAMINATION: General:  Mechanically ventilated, synchronous Neuro: Awake, responds appropriatelly HEENT:  OETT Cardiovascular:  Regular, no murmurs Lungs:  Bilateral air entry Abdomen:  Obese, bowel sounds present Musculoskeletal:  Moves all extremities Skin:  Surgical dressing clean  LABS:  CBC  Recent Labs Lab 08/16/13 0605 08/17/13 0528 08/18/13 0615  WBC 10.2 10.9* 9.0  HGB 11.2* 10.8* 10.0*  HCT 35.6* 34.6* 31.9*  PLT 210 237 236   Coag's No results found for this basename: APTT, INR,  in the last 168 hours BMET  Recent Labs Lab 08/16/13 0605 08/17/13 0528 08/18/13 0615  NA 146 146 147  K 3.6* 3.5* 3.8  CL 113* 112 115*  CO2 21 22 22   BUN 36* 32* 30*  CREATININE 1.18* 1.10 1.08  GLUCOSE 138* 129* 117*   Electrolytes  Recent Labs Lab 08/14/13 0351 08/15/13 0215 08/16/13 0605 08/17/13 0528 08/18/13 0615  CALCIUM 8.4 8.6 9.0 8.6 8.6  MG 1.8 1.7  --   --   --   PHOS 3.4 3.1  --   --   --    Sepsis Markers  Recent Labs Lab 08/13/13 0315  LATICACIDVEN 0.9   ABG  Recent Labs  Lab 08/14/13 0800 08/14/13 1106  PHART 7.111* 7.264*  PCO2ART 69.4* 43.8  PO2ART 149.0* 318.0*   Liver Enzymes No results found for this basename: AST, ALT, ALKPHOS, BILITOT, ALBUMIN,  in the last 168 hours Cardiac Enzymes  Recent Labs Lab 08/17/13 0528  PROBNP 10639.0*   Glucose  Recent Labs Lab 08/17/13 1153 08/17/13 1734 08/17/13 2117 08/18/13 0026 08/18/13 0338 08/18/13 0657  GLUCAP 131* 124* 125* 124* 111* 112*   IMAGING:   Dg Chest Port 1 View  08/18/2013   CLINICAL DATA:  Endotracheal tube placement  EXAM: PORTABLE CHEST - 1 VIEW  COMPARISON:  Prior radiograph from 08/17/2013  FINDINGS: Tip of the endotracheal tube is positioned approximately 1.7 cm above the carina. Enteric tube courses into the abdomen. Left IJ approach central venous catheter is stable in position. The catheter is coiled near its insertion site in the lower left neck.   Cardiomegaly is stable. There has been slight interval worsening in diffuse pulmonary vascular congestion with indistinctness of the interstitial markings, suggestive of progressive edema. Small moderate bilateral pleural effusions with associated bibasilar opacities are similar. No pneumothorax. No definite new focal infiltrate.  Osseous structures are unchanged.  IMPRESSION: 1. Tip of the endotracheal tube 1.7 cm above the carina. 2. Slight interval worsening in diffuse pulmonary vascular congestion with indistinctness of the interstitial markings, suggesting worsened edema. 3. Similar bilateral pleural effusions with associated bibasilar opacities, likely atelectasis.   Electronically Signed   By: Jeannine Boga M.D.   On: 08/18/2013 06:18   Dg Chest Port 1 View  08/17/2013   CLINICAL DATA:  Intubation  EXAM: PORTABLE CHEST - 1 VIEW  COMPARISON:  Prior radiograph from 08/15/2013  FINDINGS: Tip of the endotracheal tube is positioned approximately 2 cm above the carina. Left IJ approach system venous catheter is gross stable. Enteric tube courses into the abdomen. Cardiomegaly is unchanged.  Bilateral pleural effusions with associated bibasilar opacities are not significantly changed. Diffuse pulmonary vascular congestion without frank pulmonary edema is similar. No pneumothorax.  Osseous structures are unchanged.  IMPRESSION: 1. Tip of the endotracheal tube 2 cm above the carina. Otherwise stable support apparatus. 2. Small bilateral pleural effusions with associated bibasilar opacity, which may reflect atelectasis or infiltrate. 3. Stable cardiomegaly with diffuse pulmonary vascular congestion without frank pulmonary edema, similar to prior.   Electronically Signed   By: Jeannine Boga M.D.   On: 08/17/2013 06:36    ASSESSMENT / PLAN:  PULMONARY A:  Acute respiratory failure Atelectasis vs pneumonia P:   Goal pH>7.30, SpO2>92 Continuous mechanical support VAP bundle Daily SBT, but  would not extubated today; reevaluate in am Trend ABG/CXR  CARDIOVASCULAR A:  Septic shock, resolved Chronic diastolic heart failure P:  Goal MAP>65 ASA  RENAL A:   AKI Hypokalemia  P:   Hold Lasix and Lisinopril NS@100  K 20 x 1  GASTROINTESTINAL A:  Nutrition GI Px P:   Protonix TF  HEMATOLOGIC / ONC A:   Anemia VTE Px Hx breast CA P:  Trend CBC Anastrozole  Lovenox   INFECTIOUS A:   Nec fascitis R labia s/p debridement P:   Surgery following Abx / cx as above  ENDOCRINE A:   DM P:   SSI  NEUROLOGIC A:   Sedation Pain P:   Fentanyl / Versed PRN  I have personally obtained history, examined patient, evaluated and interpreted laboratory and imaging results, reviewed medical records, formulated assessment / plan and placed orders.  CRITICAL CARE:  The patient  is critically ill with multiple organ systems failure and requires high complexity decision making for assessment and support, frequent evaluation and titration of therapies, application of advanced monitoring technologies and extensive interpretation of multiple databases. Critical Care Time devoted to patient care services described in this note is 35 minutes.   Doree Fudge, MD Pulmonary and [place] Pager: [phone]  08/18/2013, 11:21 AM

## 2013-08-19 ENCOUNTER — Encounter (HOSPITAL_COMMUNITY): Payer: Medicare Other | Admitting: Certified Registered Nurse Anesthetist

## 2013-08-19 ENCOUNTER — Encounter (HOSPITAL_COMMUNITY)
Admission: EM | Disposition: A | Discharge status: Skilled Nursing Facility | Payer: Self-pay | Source: Home / Self Care | Attending: Emergency Medicine

## 2013-08-19 ENCOUNTER — Encounter (HOSPITAL_COMMUNITY): Payer: Self-pay | Admitting: Certified Registered Nurse Anesthetist

## 2013-08-19 ENCOUNTER — Inpatient Hospital Stay (HOSPITAL_COMMUNITY): Payer: Medicare Other | Admitting: Certified Registered Nurse Anesthetist

## 2013-08-19 HISTORY — PX: PILONIDAL CYST DRAINAGE: SHX743

## 2013-08-19 LAB — BASIC METABOLIC PANEL
BUN: 29 mg/dL — ABNORMAL HIGH (ref 6–23)
CO2: 23 mEq/L (ref 19–32)
Calcium: 8.6 mg/dL (ref 8.4–10.5)
Chloride: 113 mEq/L — ABNORMAL HIGH (ref 96–112)
Creatinine, Ser: 1.03 mg/dL (ref 0.50–1.10)
GFR calc Af Amer: 57 mL/min — ABNORMAL LOW (ref >90)
GFR calc non Af Amer: 50 mL/min — ABNORMAL LOW (ref >90)
Glucose, Bld: 131 mg/dL — ABNORMAL HIGH (ref 70–99)
Potassium: 3.8 mEq/L (ref 3.7–5.3)
Sodium: 146 mEq/L (ref 137–147)

## 2013-08-19 LAB — CBC
HCT: 31.8 % — ABNORMAL LOW (ref 36.0–46.0)
Hemoglobin: 9.8 g/dL — ABNORMAL LOW (ref 12.0–15.0)
MCH: 29.3 pg (ref 26.0–34.0)
MCHC: 30.8 g/dL (ref 30.0–36.0)
MCV: 94.9 fL (ref 78.0–100.0)
Platelets: 228 10*3/uL (ref 150–400)
RBC: 3.35 MIL/uL — ABNORMAL LOW (ref 3.87–5.11)
RDW: 17.2 % — ABNORMAL HIGH (ref 11.5–15.5)
WBC: 9.1 10*3/uL (ref 4.0–10.5)

## 2013-08-19 LAB — CULTURE, BLOOD (ROUTINE X 2)
Culture: NO GROWTH
Culture: NO GROWTH

## 2013-08-19 LAB — GLUCOSE, CAPILLARY
Glucose-Capillary: 110 mg/dL — ABNORMAL HIGH (ref 70–99)
Glucose-Capillary: 111 mg/dL — ABNORMAL HIGH (ref 70–99)
Glucose-Capillary: 114 mg/dL — ABNORMAL HIGH (ref 70–99)
Glucose-Capillary: 119 mg/dL — ABNORMAL HIGH (ref 70–99)
Glucose-Capillary: 123 mg/dL — ABNORMAL HIGH (ref 70–99)
Glucose-Capillary: 93 mg/dL (ref 70–99)

## 2013-08-19 SURGERY — EXCISION, PILONIDAL CYST
Anesthesia: General | Site: Perineum

## 2013-08-19 MED ORDER — SODIUM CHLORIDE 0.9 % IR SOLN
Status: DC | PRN
  Administered 2013-08-19: 3000 mL

## 2013-08-19 MED ORDER — FENTANYL CITRATE 0.05 MG/ML IJ SOLN: 25.0000 ug | INTRAMUSCULAR | Status: DC | PRN

## 2013-08-19 MED ORDER — MIDAZOLAM HCL 5 MG/5ML IJ SOLN
INTRAMUSCULAR | Status: DC | PRN
  Administered 2013-08-19: 2 mg via INTRAVENOUS

## 2013-08-19 MED ORDER — LACTATED RINGERS IV SOLN
INTRAVENOUS | Status: DC | PRN
  Administered 2013-08-19: 15:00:00 via INTRAVENOUS

## 2013-08-19 MED ORDER — FENTANYL CITRATE 0.05 MG/ML IJ SOLN
INTRAMUSCULAR | Status: DC | PRN
  Administered 2013-08-19 (×2): 100 ug via INTRAVENOUS
  Administered 2013-08-19: 50 ug via INTRAVENOUS

## 2013-08-19 MED ORDER — ROCURONIUM BROMIDE 100 MG/10ML IV SOLN
INTRAVENOUS | Status: DC | PRN
  Administered 2013-08-19: 50 mg via INTRAVENOUS

## 2013-08-19 MED ORDER — HYDRALAZINE HCL 20 MG/ML IJ SOLN
10.0000 mg | INTRAMUSCULAR | Status: DC | PRN
  Administered 2013-08-20 – 2013-08-25 (×7): 10 mg via INTRAVENOUS
  Filled 2013-08-19 (×6): qty 1
  Filled 2013-08-19: qty 0.5
  Filled 2013-08-19: qty 1

## 2013-08-19 MED ORDER — FENTANYL CITRATE 0.05 MG/ML IJ SOLN
INTRAMUSCULAR | Status: AC
  Filled 2013-08-19: qty 5

## 2013-08-19 MED ORDER — ENOXAPARIN SODIUM 60 MG/0.6ML ~~LOC~~ SOLN
60.0000 mg | SUBCUTANEOUS | Status: DC
  Administered 2013-08-19 – 2013-08-28 (×10): 60 mg via SUBCUTANEOUS
  Filled 2013-08-19 (×11): qty 0.6

## 2013-08-19 MED ORDER — MIDAZOLAM HCL 2 MG/2ML IJ SOLN
INTRAMUSCULAR | Status: AC
  Filled 2013-08-19: qty 2

## 2013-08-19 SURGICAL SUPPLY — 28 items
BANDAGE GAUZE ELAST BULKY 4 IN (GAUZE/BANDAGES/DRESSINGS) ×2 IMPLANT
BLADE HEX COATED 2.75 (ELECTRODE) ×2 IMPLANT
BLADE SURG 15 STRL LF DISP TIS (BLADE) ×2 IMPLANT
BLADE SURG 15 STRL SS (BLADE) ×2
DRAPE LAPAROTOMY T 102X78X121 (DRAPES) ×2 IMPLANT
ELECT REM PT RETURN 9FT ADLT (ELECTROSURGICAL) ×2
ELECTRODE REM PT RTRN 9FT ADLT (ELECTROSURGICAL) ×1 IMPLANT
GAUZE SPONGE 4X4 16PLY XRAY LF (GAUZE/BANDAGES/DRESSINGS) ×2 IMPLANT
GLOVE BIO SURGEON STRL SZ7.5 (GLOVE) ×4 IMPLANT
GOWN STRL REUS W/ TWL XL LVL3 (GOWN DISPOSABLE) ×1 IMPLANT
GOWN STRL REUS W/TWL LRG LVL3 (GOWN DISPOSABLE) ×2 IMPLANT
GOWN STRL REUS W/TWL XL LVL3 (GOWN DISPOSABLE) ×3 IMPLANT
HANDPIECE INTERPULSE COAX TIP (DISPOSABLE) ×1
KIT BASIN OR (CUSTOM PROCEDURE TRAY) ×2 IMPLANT
LEGGING LITHOTOMY PAIR STRL (DRAPES) ×2 IMPLANT
LUBRICANT JELLY K Y 4OZ (MISCELLANEOUS) ×2 IMPLANT
NEEDLE HYPO 22GX1.5 SAFETY (NEEDLE) ×2 IMPLANT
PACK BASIC VI WITH GOWN DISP (CUSTOM PROCEDURE TRAY) ×2 IMPLANT
PAD ABD 8X10 STRL (GAUZE/BANDAGES/DRESSINGS) ×2 IMPLANT
PENCIL BUTTON HOLSTER BLD 10FT (ELECTRODE) ×2 IMPLANT
SET HNDPC FAN SPRY TIP SCT (DISPOSABLE) ×1 IMPLANT
SPONGE GAUZE 4X4 12PLY (GAUZE/BANDAGES/DRESSINGS) ×2 IMPLANT
SPONGE LAP 18X18 X RAY DECT (DISPOSABLE) ×2 IMPLANT
SPONGE SURGIFOAM ABS GEL 12-7 (HEMOSTASIS) IMPLANT
SYR CONTROL 10ML LL (SYRINGE) ×2 IMPLANT
TAPE CLOTH SURG 4X10 WHT LF (GAUZE/BANDAGES/DRESSINGS) ×2 IMPLANT
TOWEL OR 17X26 10 PK STRL BLUE (TOWEL DISPOSABLE) ×2 IMPLANT
YANKAUER SUCT BULB TIP 10FT TU (MISCELLANEOUS) ×2 IMPLANT

## 2013-08-19 NOTE — Progress Notes (Signed)
Patient ID: Bonnetta Barry, female   DOB: 1930-06-03, 80 y.o.   MRN: [id number]   Subjective: White count down.  No f/c.  Tolerating TF. Awake on vent  Objective:  Vital signs:  Filed Vitals:   08/19/13 0400 08/19/13 0415 08/19/13 0500 08/19/13 0600  BP:  173/66 171/64 156/38  Pulse:  73 67 59  Temp: 98.3 F (36.8 C)     TempSrc: Axillary     Resp:  20 18 15   Height:      Weight: 291 lb 10.7 oz (132.3 kg)     SpO2:  98% 98% 97%    Last BM Date: 08/18/13  Intake/Output   Yesterday:  03/08 0701 - 03/09 0700 In: 7253 [I.V.:2403; NG/GT:1018; IV Piggyback:700] Out: 6644 [Urine:1000; Emesis/NG output:320; Stool:100] This shift:    I/O last 3 completed shifts: In: 5716.3 [I.V.:3503; Other:110; NG/GT:1303.3; IV Piggyback:800] Out: 1970 [Urine:1450; Emesis/NG output:320; Stool:200]    Physical Exam: General: awake on vent Chest: cta. CV:  Pulses intact.  Regular rhythm Abdomen: Soft.  Nondistended. Non tender. No evidence of peritonitis.  No incarcerated hernias. Skin: dressing c/d/i   Problem List:   Principal Problem:   Necrotizing fasciitis of perineum Active Problems:   DIABETES MELLITUS, TYPE II   HYPERLIPIDEMIA   GOUT   ANXIETY   HYPERTENSION   Morbid obesity   Breast cancer, right breast-lobular s/p lumpectomy and sentinel node biopsy   Wheezing   Leukocytosis   Acute on chronic kidney disease, stage 3   Hypotension, unspecified   Septic shock(785.52)   Severe sepsis with septic shock   Acute respiratory failure   Sepsis    Results:   Labs: Results for orders placed during the hospital encounter of 08/12/13 (from the past 48 hour(s))  GLUCOSE, CAPILLARY     Status: Abnormal   Collection Time    08/17/13  7:46 AM      Result Value Ref Range   Glucose-Capillary 123 (*) 70 - 99 mg/dL  GLUCOSE, CAPILLARY     Status: Abnormal   Collection Time    08/17/13 11:53 AM      Result Value Ref Range   Glucose-Capillary 131 (*) 70 - 99 mg/dL   Comment 1  Documented in Chart     Comment 2 Notify RN    GLUCOSE, CAPILLARY     Status: Abnormal   Collection Time    08/17/13  5:34 PM      Result Value Ref Range   Glucose-Capillary 124 (*) 70 - 99 mg/dL   Comment 1 Documented in Chart     Comment 2 Notify RN    GLUCOSE, CAPILLARY     Status: Abnormal   Collection Time    08/17/13  9:17 PM      Result Value Ref Range   Glucose-Capillary 125 (*) 70 - 99 mg/dL  GLUCOSE, CAPILLARY     Status: Abnormal   Collection Time    08/18/13 12:26 AM      Result Value Ref Range   Glucose-Capillary 124 (*) 70 - 99 mg/dL  GLUCOSE, CAPILLARY     Status: Abnormal   Collection Time    08/18/13  3:38 AM      Result Value Ref Range   Glucose-Capillary 111 (*) 70 - 99 mg/dL  CBC     Status: Abnormal   Collection Time    08/18/13  6:15 AM      Result Value Ref Range   WBC 9.0  4.0 - 10.5  K/uL   RBC 3.42 (*) 3.87 - 5.11 MIL/uL   Hemoglobin 10.0 (*) 12.0 - 15.0 g/dL   HCT 31.9 (*) 36.0 - 46.0 %   MCV 93.3  78.0 - 100.0 fL   MCH 29.2  26.0 - 34.0 pg   MCHC 31.3  30.0 - 36.0 g/dL   RDW 16.9 (*) 11.5 - 15.5 %   Platelets 236  150 - 400 K/uL  BASIC METABOLIC PANEL     Status: Abnormal   Collection Time    08/18/13  6:15 AM      Result Value Ref Range   Sodium 147  137 - 147 mEq/L   Potassium 3.8  3.7 - 5.3 mEq/L   Chloride 115 (*) 96 - 112 mEq/L   CO2 22  19 - 32 mEq/L   Glucose, Bld 117 (*) 70 - 99 mg/dL   BUN 30 (*) 6 - 23 mg/dL   Creatinine, Ser 1.08  0.50 - 1.10 mg/dL   Calcium 8.6  8.4 - 10.5 mg/dL   GFR calc non Af Amer 47 (*) >90 mL/min   GFR calc Af Amer 54 (*) >90 mL/min   Comment: (NOTE)     The eGFR has been calculated using the CKD EPI equation.     This calculation has not been validated in all clinical situations.     eGFR's persistently <90 mL/min signify possible Chronic Kidney     Disease.  GLUCOSE, CAPILLARY     Status: Abnormal   Collection Time    08/18/13  6:57 AM      Result Value Ref Range   Glucose-Capillary 112 (*) 70  - 99 mg/dL  GLUCOSE, CAPILLARY     Status: Abnormal   Collection Time    08/18/13 12:15 PM      Result Value Ref Range   Glucose-Capillary 121 (*) 70 - 99 mg/dL  GLUCOSE, CAPILLARY     Status: Abnormal   Collection Time    08/18/13  4:12 PM      Result Value Ref Range   Glucose-Capillary 117 (*) 70 - 99 mg/dL  VANCOMYCIN, TROUGH     Status: None   Collection Time    08/18/13  4:13 PM      Result Value Ref Range   Vancomycin Tr 17.7  10.0 - 20.0 ug/mL  GLUCOSE, CAPILLARY     Status: Abnormal   Collection Time    08/18/13  8:41 PM      Result Value Ref Range   Glucose-Capillary 131 (*) 70 - 99 mg/dL  GLUCOSE, CAPILLARY     Status: Abnormal   Collection Time    08/19/13 12:21 AM      Result Value Ref Range   Glucose-Capillary 123 (*) 70 - 99 mg/dL  CBC     Status: Abnormal   Collection Time    08/19/13  4:45 AM      Result Value Ref Range   WBC 9.1  4.0 - 10.5 K/uL   RBC 3.35 (*) 3.87 - 5.11 MIL/uL   Hemoglobin 9.8 (*) 12.0 - 15.0 g/dL   HCT 31.8 (*) 36.0 - 46.0 %   MCV 94.9  78.0 - 100.0 fL   MCH 29.3  26.0 - 34.0 pg   MCHC 30.8  30.0 - 36.0 g/dL   RDW 17.2 (*) 11.5 - 15.5 %   Platelets 228  150 - 400 K/uL  BASIC METABOLIC PANEL     Status: Abnormal   Collection Time  08/19/13  4:45 AM      Result Value Ref Range   Sodium 146  137 - 147 mEq/L   Potassium 3.8  3.7 - 5.3 mEq/L   Chloride 113 (*) 96 - 112 mEq/L   CO2 23  19 - 32 mEq/L   Glucose, Bld 131 (*) 70 - 99 mg/dL   BUN 29 (*) 6 - 23 mg/dL   Creatinine, Ser 1.03  0.50 - 1.10 mg/dL   Calcium 8.6  8.4 - 10.5 mg/dL   GFR calc non Af Amer 50 (*) >90 mL/min   GFR calc Af Amer 57 (*) >90 mL/min   Comment: (NOTE)     The eGFR has been calculated using the CKD EPI equation.     This calculation has not been validated in all clinical situations.     eGFR's persistently <90 mL/min signify possible Chronic Kidney     Disease.    Imaging / Studies: Dg Chest Port 1 View  08/18/2013   CLINICAL DATA:  Endotracheal  tube placement  EXAM: PORTABLE CHEST - 1 VIEW  COMPARISON:  Prior radiograph from 08/17/2013  FINDINGS: Tip of the endotracheal tube is positioned approximately 1.7 cm above the carina. Enteric tube courses into the abdomen. Left IJ approach central venous catheter is stable in position. The catheter is coiled near its insertion site in the lower left neck.  Cardiomegaly is stable. There has been slight interval worsening in diffuse pulmonary vascular congestion with indistinctness of the interstitial markings, suggestive of progressive edema. Small moderate bilateral pleural effusions with associated bibasilar opacities are similar. No pneumothorax. No definite new focal infiltrate.  Osseous structures are unchanged.  IMPRESSION: 1. Tip of the endotracheal tube 1.7 cm above the carina. 2. Slight interval worsening in diffuse pulmonary vascular congestion with indistinctness of the interstitial markings, suggesting worsened edema. 3. Similar bilateral pleural effusions with associated bibasilar opacities, likely atelectasis.   Electronically Signed   By: Jeannine Boga M.D.   On: 08/18/2013 06:18    Medications / Allergies: per chart  Antibiotics: Anti-infectives   Start     Dose/Rate Route Frequency Ordered Stop   08/15/13 1800  vancomycin (VANCOCIN) IVPB 1000 mg/200 mL premix     1,000 mg 200 mL/hr over 60 Minutes Intravenous Every 24 hours 08/15/13 1414     08/14/13 1400  vancomycin (VANCOCIN) 1,500 mg in sodium chloride 0.9 % 500 mL IVPB  Status:  Discontinued     1,500 mg 250 mL/hr over 120 Minutes Intravenous Every 48 hours 08/12/13 1501 08/13/13 1032   08/13/13 1400  vancomycin (VANCOCIN) 1,500 mg in sodium chloride 0.9 % 500 mL IVPB  Status:  Discontinued     1,500 mg 250 mL/hr over 120 Minutes Intravenous Every 24 hours 08/13/13 1032 08/15/13 1411   08/12/13 1800  piperacillin-tazobactam (ZOSYN) IVPB 3.375 g     3.375 g 12.5 mL/hr over 240 Minutes Intravenous Every 8 hours 08/12/13  1737     08/12/13 1730  clindamycin (CLEOCIN) IVPB 600 mg     600 mg 100 mL/hr over 30 Minutes Intravenous 3 times per day 08/12/13 1729     08/12/13 1445  vancomycin (VANCOCIN) 2,000 mg in sodium chloride 0.9 % 500 mL IVPB     2,000 mg 250 mL/hr over 120 Minutes Intravenous STAT 08/12/13 1432 08/13/13 1900      Assessment/Plan Necrotizing fasciitis of perineum S/P OR debridement 3/2 3/5 3/6---Dr. Harlow Asa  OR later today for dressing change and possible debridement Hold TF--off  since 0730 Continue antibiotics, monitor white count   Erby Pian, [facility] Surgery Pager [phone] Office [phone]  08/19/2013 7:49 AM

## 2013-08-19 NOTE — Progress Notes (Signed)
I have seen and examined the pt and agree with Np-Riebock's progress note. To OR today for dressing change S/w family in the room and updated them on her status

## 2013-08-19 NOTE — Op Note (Signed)
08/12/2013 - 08/19/2013  3:43 PM  PATIENT:  [name]  80 y.o. female  PRE-OPERATIVE DIAGNOSIS:  wound perineum  POST-OPERATIVE DIAGNOSIS:  same  PROCEDURE:  Procedure(s): D&I perineum wound(N/A)  SURGEON:  Surgeon(s) and Role:    * Ralene Ok, MD - Primary  PHYSICIAN ASSISTANT:   ASSISTANTS: none   ANESTHESIA:   general  EBL:  Total I/O In: 670 [I.V.:500; Other:20; IV Piggyback:150] Out: -   BLOOD ADMINISTERED:none  DRAINS: packed with 1/5 kerlix rolls   LOCAL MEDICATIONS USED:  NONE  SPECIMEN:  No Specimen  DISPOSITION OF SPECIMEN:  N/A  COUNTS:  YES  TOURNIQUET:  * No tourniquets in log *  DICTATION: .Dragon Dictation The patient was consented the patient was taken the operating room in the lithotomy position. Patient was prepped and draped in the usual sterile fashion.   This time the previous Kerlix packing was removed. I examined the wound and there was no areas of necrosis or purulence that could be seen. The was beefy-red and bleeding, and healthy looking.At this time a Pulsavac was used to irrigate the wound clinic entirety. Once this was done the area is checked for hemostasis, a few areas that were bleeding were stopped with electrocautery.  At this time one and a half rolls of Kerlix were then placed into the wound. The area was dressed with 4x4s ABDs pad and tape. The patient was taken back to the ICU in stable condition.   PLAN OF CARE: order in place  PATIENT DISPOSITION:  ICU - intubated and hemodynamically stable.   Delay start of Pharmacological VTE agent (>24hrs) due to surgical blood loss or risk of bleeding: not applicable

## 2013-08-19 NOTE — Progress Notes (Signed)
NUTRITION FOLLOW UP  Intervention:   - Recommend resuming TF of Vital High Protein at 81ml/hr with Prostat 56ml TID once appropriate after surgery. This will provide 1260 calories, 129g protein, and 820ml free water which will meet 77% estimated calorie needs (25 kcals/kg) and 100% estimated protein needs. If IVF d/c, recommend 167ml water flushes 4 times/day.  - Will continue to monitor   Nutrition Dx:   Inadequate oral intake related to clear liquid diet as evidenced by diet order - ongoing but now related to inability to eat as evidenced by NPO, mechanical ventilation.    Goal:   Enteral nutrition to provide 60-70% of estimated calorie needs (22-25 kcals/kg ideal body weight) and 100% of estimated protein needs, based on ASPEN guidelines for permissive underfeeding in critically ill obese individuals - met   Monitor:   Weights, labs, TF tolerance/advancement, vent status   Assessment:   Admitted with abscess on buttocks x 3 days, found to have necrotizing fascitis of the perineum. Pt with history of paroxysmal atrial flutter, T2DM, morbid obesity, CVA, GERD, post-operative DVT, CKD, breast CA, anxiety, and anemia. Had incision and drainage of abscess 08/12/13.   3/3 - Met with pt and family who report pt has been hardly eating anything for the past 3 days. States before then she was eating 2 meals/day and some snacks and reports her meals were well balanced. Denies any nausea or diarrhea today, had both yesterday. Denies any changes in weight.   3/4 - Noted events of earlier this morning with decrease in oxygen saturations and pt becoming unresponsive. Intubated today. Plans for additional surgery today. Obtained verbal order from NP to manage TF. Pt with mittens on.   3/9 - Had additional surgical debridement 3/4. Had frequent loose stools 3/4 and flexiseal was placed. Had repeat surgical debridement of perineum wound 3/6. Had debridement at bedside 3/8. Per conversation with RN, pt  tolerating TF well before it was turned off this morning at 7:30am for surgery later on today. Remains intubated. Pt's weight up 41 pounds since admission. Pt with 157ml rectal tube output yesterday, same as the day before.   Patient is currently intubated on ventilator support.  MV: 7.6 L/min Temp (24hrs), Avg:98.2 F (36.8 C), Min:97.8 F (36.6 C), Max:98.5 F (36.9 C)  Propofol: off   TF:  Vital High Protein via OGT at 44ml/hr with Prostat 28ml TID. This provides 1260 calories, 129g protein, and 843ml free water which will meet 77% estimated calorie needs (25 kcals/kg) and 100% estimated protein needs. If IVF d/c, recommend 122ml water flushes 4 times/day.    Height: Ht Readings from Last 1 Encounters:  08/14/13 $RemoveB'5\' 2"'ZShHvLBa$  (1.575 m)    Weight Status:   Wt Readings from Last 1 Encounters:  08/19/13 291 lb 10.7 oz (132.3 kg)  Admit wt:        250 lb (113.3 kg) Net I/Os: +20.2L  Re-estimated needs:  Kcal: 1643 Protein: 125g Fluid: >1.6L/day  Skin: +2 generalized edema, perineum incision, right buttocks incision    Diet Order: NPO   Intake/Output Summary (Last 24 hours) at 08/19/13 1053 Last data filed at 08/19/13 0800  Gross per 24 hour  Intake   3823 ml  Output   1295 ml  Net   2528 ml    Last BM: 3/9   Labs:   Recent Labs Lab 08/13/13 0310 08/14/13 0351 08/15/13 0215  08/17/13 0528 08/18/13 0615 08/19/13 0445  NA 138 141 145  < > 146 147 146  K 4.6 4.0 4.0  < > 3.5* 3.8 3.8  CL 103 107 113*  < > 112 115* 113*  CO2 _0 < > _1 BUN 53* 43* 42*  < > 32* 30* 29*  CREATININE 1.57* 1.28* 1.30*  < > 1.10 1.08 1.03  CALCIUM 8.0* 8.4 8.6  < > 8.6 8.6 8.6  MG  --  1.8 1.7  --   --   --   --   PHOS  --  3.4 3.1  --   --   --   --   GLUCOSE 171* 118* 139*  < > 129* 117* 131*  < > = values in this interval not displayed.  CBG (last 3)   Recent Labs  08/18/13 2041 08/19/13 0021 08/19/13 0822  GLUCAP 131* 123* 119*    Scheduled Meds: .  anastrozole  1 mg Oral q morning - 10a  . antiseptic oral rinse  15 mL Mouth Rinse QID  . aspirin EC  243 mg Oral QODAY  . aspirin EC  325 mg Oral QODAY  . chlorhexidine  15 mL Mouth Rinse BID  . cholecalciferol  1,000 Units Oral q morning - 10a  . clindamycin (CLEOCIN) IV  600 mg Intravenous 3 times per day  . enoxaparin (LOVENOX) injection  60 mg Subcutaneous Q24H  . feeding supplement (PRO-STAT SUGAR FREE 64)  30 mL Per Tube TID  . feeding supplement (VITAL HIGH PROTEIN)  1,000 mL Per Tube Q24H  . fentaNYL  100 mcg Intravenous Once  . insulin aspart  0-9 Units Subcutaneous TID WC  . pantoprazole sodium  40 mg Per Tube Daily  . piperacillin-tazobactam (ZOSYN)  IV  3.375 g Intravenous Q8H  . sodium chloride  3 mL Intravenous Q12H  . vancomycin  1,000 mg Intravenous Q24H    Continuous Infusions: . sodium chloride 100 mL/hr at 08/18/13 2300  . sodium chloride 20 mL/hr (08/16/13 1319)    Mikey College MS, RD, [place] Pager [phone] After Hours Pager

## 2013-08-19 NOTE — Transfer of Care (Signed)
Immediate Anesthesia Transfer of Care Note  Patient: [name]  Procedure(s) Performed: Procedure(s): Dressing change perineum (N/A)  Patient Location: ICU  Anesthesia Type:General  Level of Consciousness: sedated and Patient remains intubated per anesthesia plan  Airway & Oxygen Therapy: Patient remains intubated per anesthesia plan  Post-op Assessment: Report given to PACU RN  Post vital signs: Reviewed and stable  Complications: No apparent anesthesia complications

## 2013-08-19 NOTE — Progress Notes (Signed)
PULMONARY / CRITICAL CARE MEDICINE  Name: [name] MRN: [id number] DOB: 1930-03-12    ADMISSION DATE:  08/12/2013 CONSULTATION DATE:  08/13/2013  REFERRING MD :  [facility] PRIMARY SERVICE: PCCM  CHIEF COMPLAINT:  Hypotension  BRIEF PATIENT DESCRIPTION:  80 y/o obese (250 lbs) female admitted 3/2 with R labial necrotizing fasciitis s/p debriedement complicated by septic shock.  SIGNIFICANT EVENTS / STUDIES:  3/3  OR >>> Labia debridement 3/3  TTE EF 123456 grade 2 diastolic dysfunction 3/4  Oversedated with Dilaudid and required Narcan x 2  3/6  OR >>> Further debridement 3/8  Debrided at bedside, apneic with minimal sedation (Fentanyl 100 Versed 2)   LINES / TUBES: L rad A line  3/3 >>> 3/4 L IJ CVL 3/3 >>> OETT 3/3 >>> OGT 3/3 >>> Foley 3/2 >>  CULTURES: 3/2  MRSA PCR >>> neg 3/2  Abscess >>> MULTIPLE ORGANISMS 3/2  Blood >>>neg  ANTIBIOTICS: Vancomycin 3/2 >>> Zosyn 3/2 >>> Clindamycin  3/2 >>>  INTERVAL HISTORY:  Planned return to OR for debridement ~ 1pm.  No acute events.  C/O pain at debridement site  VITAL SIGNS: Temp:  [97.8 F (36.6 C)-98.5 F (36.9 C)] 98.3 F (36.8 C) (03/09 0400) Pulse Rate:  [56-94] 56 (03/09 0700) Resp:  [15-36] 15 (03/09 0700) BP: (126-173)/(36-108) 166/51 mmHg (03/09 0700) SpO2:  [96 %-99 %] 97 % (03/09 0700) FiO2 (%):  [40 %] 40 % (03/09 0700) Weight:  [291 lb 10.7 oz (132.3 kg)] 291 lb 10.7 oz (132.3 kg) (03/09 0400)  HEMODYNAMICS:   VENTILATOR SETTINGS: Vent Mode:  [-] PRVC FiO2 (%):  [40 %] 40 % Set Rate:  [15 bmp] 15 bmp Vt Set:  [500 mL] 500 mL PEEP:  [5 cmH20] 5 cmH20 Pressure Support:  [10 cmH20-12 cmH20] 12 cmH20 Plateau Pressure:  [14 cmH20-34 cmH20] 18 cmH20  INTAKE / OUTPUT: Intake/Output     03/08 0701 - 03/09 0700 03/09 0701 - 03/10 0700   I.V. (mL/kg) 2503 (18.9) 100 (0.8)   Other 110 20   NG/GT 1018    IV Piggyback 700    Total Intake(mL/kg) 4331 (32.7) 120 (0.9)   Urine (mL/kg/hr) 1100 (0.3)     Emesis/NG output 320 (0.1)    Stool 100 (0)    Total Output 1520     Net +2811 +120         PHYSICAL EXAMINATION: General:  Mechanically ventilated, synchronous Neuro: Awake, responds appropriatelly HEENT:  OETT Cardiovascular:  Regular, no murmurs Lungs:  Bilateral air entry Abdomen:  Obese, bowel sounds present Musculoskeletal:  Moves all extremities Skin:  Surgical dressing clean  LABS:  CBC  Recent Labs Lab 08/17/13 0528 08/18/13 0615 08/19/13 0445  WBC 10.9* 9.0 9.1  HGB 10.8* 10.0* 9.8*  HCT 34.6* 31.9* 31.8*  PLT 237 236 228   Coag's No results found for this basename: APTT, INR,  in the last 168 hours BMET  Recent Labs Lab 08/17/13 0528 08/18/13 0615 08/19/13 0445  NA 146 147 146  K 3.5* 3.8 3.8  CL 112 115* 113*  CO2 22 22 23   BUN 32* 30* 29*  CREATININE 1.10 1.08 1.03  GLUCOSE 129* 117* 131*   Electrolytes  Recent Labs Lab 08/14/13 0351 08/15/13 0215  08/17/13 0528 08/18/13 0615 08/19/13 0445  CALCIUM 8.4 8.6  < > 8.6 8.6 8.6  MG 1.8 1.7  --   --   --   --   PHOS 3.4 3.1  --   --   --   --   < > =  values in this interval not displayed. Sepsis Markers  Recent Labs Lab 08/13/13 0315  LATICACIDVEN 0.9   ABG  Recent Labs Lab 08/14/13 0800 08/14/13 1106  PHART 7.111* 7.264*  PCO2ART 69.4* 43.8  PO2ART 149.0* 318.0*   Liver Enzymes No results found for this basename: AST, ALT, ALKPHOS, BILITOT, ALBUMIN,  in the last 168 hours Cardiac Enzymes  Recent Labs Lab 08/17/13 0528  PROBNP 10639.0*   Glucose  Recent Labs Lab 08/18/13 0657 08/18/13 1215 08/18/13 1612 08/18/13 2041 08/19/13 0021 08/19/13 0822  GLUCAP 112* 121* 117* 131* 123* 119*   IMAGING:   Dg Chest Port 1 View  08/18/2013   CLINICAL DATA:  Endotracheal tube placement  EXAM: PORTABLE CHEST - 1 VIEW  COMPARISON:  Prior radiograph from 08/17/2013  FINDINGS: Tip of the endotracheal tube is positioned approximately 1.7 cm above the carina. Enteric tube courses  into the abdomen. Left IJ approach central venous catheter is stable in position. The catheter is coiled near its insertion site in the lower left neck.  Cardiomegaly is stable. There has been slight interval worsening in diffuse pulmonary vascular congestion with indistinctness of the interstitial markings, suggestive of progressive edema. Small moderate bilateral pleural effusions with associated bibasilar opacities are similar. No pneumothorax. No definite new focal infiltrate.  Osseous structures are unchanged.  IMPRESSION: 1. Tip of the endotracheal tube 1.7 cm above the carina. 2. Slight interval worsening in diffuse pulmonary vascular congestion with indistinctness of the interstitial markings, suggesting worsened edema. 3. Similar bilateral pleural effusions with associated bibasilar opacities, likely atelectasis.   Electronically Signed   By: Jeannine Boga M.D.   On: 08/18/2013 06:18    ASSESSMENT / PLAN:  PULMONARY A:  Acute respiratory failure Atelectasis vs pneumonia P:   Goal pH>7.30, SpO2>92 Continuous mechanical support Daily SBT / WUA, no extubation until no further debridement planned.  Will await CCS input for decision.  Trend ABG/CXR  CARDIOVASCULAR A:  Septic shock, resolved Chronic diastolic heart failure Hypertension  P:  Goal MAP>65 ASA Hold home lasix, lisinopril  RENAL A:   AKI Hypokalemia  P:   Hold Lasix and Lisinopril NS@100   GASTROINTESTINAL A:  Nutrition GI Px P:   Protonix TF on hold for pending OR visit  HEMATOLOGIC / ONC A:   Anemia VTE Px Hx breast CA P:  Trend CBC Anastrozole  Lovenox   INFECTIOUS A:   Nec fascitis R labia s/p debridement P:   Surgery following Abx / cx as above  ENDOCRINE A:   DM P:   SSI  NEUROLOGIC A:   Sedation Pain P:   Fentanyl / Versed PRN   [name], NP-C [facility] Pgr: [phone] or [phone]  08/19/2013, 9:28 AM  Reviewed above, and examined  pt.  She will go back to OR later today.  Will continue vent support until done with OR trips.  Will adjust pain meds as needed.  PRN hydralazine for SBP > 170.  Updated family at bedside.  Chesley Mires, MD [facility] Pulmonary/Critical Care 08/19/2013, 11:50 AM Pager:  [phone] After 3pm call: [phone]

## 2013-08-19 NOTE — Anesthesia Preprocedure Evaluation (Addendum)
Anesthesia Evaluation  Patient identified by MRN, date of birth, ID band Patient awake    Reviewed: Allergy & Precautions, H&P , NPO status , Patient's Chart, lab work & pertinent test results  History of Anesthesia Complications (+) PONV and history of anesthetic complications  Airway Mallampati: II TM Distance: >3 FB Neck ROM: Full    Dental no notable dental hx.    Pulmonary shortness of breath, pneumonia -,  breath sounds clear to auscultation  Pulmonary exam normal       Cardiovascular hypertension, + Peripheral Vascular Disease negative cardio ROS  + dysrhythmias Rhythm:Regular Rate:Normal     Neuro/Psych Anxiety  Neuromuscular disease CVA    GI/Hepatic Neg liver ROS, hiatal hernia, GERD-  ,  Endo/Other  diabetes  Renal/GU Renal disease  negative genitourinary   Musculoskeletal negative musculoskeletal ROS (+)   Abdominal   Peds negative pediatric ROS (+)  Hematology  (+) anemia ,   Anesthesia Other Findings   Reproductive/Obstetrics negative OB ROS                         Anesthesia Physical Anesthesia Plan  ASA: IV  Anesthesia Plan: General   Post-op Pain Management:    Induction: Intravenous  Airway Management Planned: Oral ETT  Additional Equipment:   Intra-op Plan:   Post-operative Plan:   Informed Consent: I have reviewed the patients History and Physical, chart, labs and discussed the procedure including the risks, benefits and alternatives for the proposed anesthesia with the patient or authorized representative who has indicated his/her understanding and acceptance.   Dental advisory given  Plan Discussed with: CRNA  Anesthesia Plan Comments:        Anesthesia Quick Evaluation

## 2013-08-20 ENCOUNTER — Inpatient Hospital Stay (HOSPITAL_COMMUNITY): Payer: Medicare Other

## 2013-08-20 ENCOUNTER — Inpatient Hospital Stay (HOSPITAL_COMMUNITY): Payer: Medicare Other | Admitting: Anesthesiology

## 2013-08-20 ENCOUNTER — Encounter (HOSPITAL_COMMUNITY): Payer: Self-pay | Admitting: General Surgery

## 2013-08-20 ENCOUNTER — Encounter (HOSPITAL_COMMUNITY): Payer: Medicare Other | Admitting: Anesthesiology

## 2013-08-20 ENCOUNTER — Encounter (HOSPITAL_COMMUNITY)
Admission: EM | Disposition: A | Discharge status: Skilled Nursing Facility | Payer: Self-pay | Source: Home / Self Care | Attending: Emergency Medicine

## 2013-08-20 HISTORY — PX: INCISION AND DRAINAGE ABSCESS: SHX5864

## 2013-08-20 HISTORY — PX: MINOR APPLICATION OF WOUND VAC: SHX6243

## 2013-08-20 LAB — CBC
HCT: 31 % — ABNORMAL LOW (ref 36.0–46.0)
Hemoglobin: 9.5 g/dL — ABNORMAL LOW (ref 12.0–15.0)
MCH: 29 pg (ref 26.0–34.0)
MCHC: 30.6 g/dL (ref 30.0–36.0)
MCV: 94.5 fL (ref 78.0–100.0)
Platelets: 254 10*3/uL (ref 150–400)
RBC: 3.28 MIL/uL — ABNORMAL LOW (ref 3.87–5.11)
RDW: 17.5 % — ABNORMAL HIGH (ref 11.5–15.5)
WBC: 8.9 10*3/uL (ref 4.0–10.5)

## 2013-08-20 LAB — BASIC METABOLIC PANEL
BUN: 26 mg/dL — ABNORMAL HIGH (ref 6–23)
CO2: 24 mEq/L (ref 19–32)
Calcium: 8.6 mg/dL (ref 8.4–10.5)
Chloride: 112 mEq/L (ref 96–112)
Creatinine, Ser: 0.99 mg/dL (ref 0.50–1.10)
GFR calc Af Amer: 60 mL/min — ABNORMAL LOW (ref >90)
GFR calc non Af Amer: 52 mL/min — ABNORMAL LOW (ref >90)
Glucose, Bld: 139 mg/dL — ABNORMAL HIGH (ref 70–99)
Potassium: 3.6 mEq/L — ABNORMAL LOW (ref 3.7–5.3)
Sodium: 146 mEq/L (ref 137–147)

## 2013-08-20 LAB — GLUCOSE, CAPILLARY
Glucose-Capillary: 100 mg/dL — ABNORMAL HIGH (ref 70–99)
Glucose-Capillary: 103 mg/dL — ABNORMAL HIGH (ref 70–99)
Glucose-Capillary: 122 mg/dL — ABNORMAL HIGH (ref 70–99)
Glucose-Capillary: 120 mg/dL — ABNORMAL HIGH (ref 70–99)

## 2013-08-20 SURGERY — MINOR APPLICATION OF WOUND VAC
Anesthesia: General

## 2013-08-20 MED ORDER — MEPERIDINE HCL 50 MG/ML IJ SOLN: 6.2500 mg | INTRAMUSCULAR | Status: DC | PRN

## 2013-08-20 MED ORDER — PROMETHAZINE HCL 25 MG/ML IJ SOLN: 6.2500 mg | INTRAMUSCULAR | Status: DC | PRN

## 2013-08-20 MED ORDER — FENTANYL CITRATE 0.05 MG/ML IJ SOLN
INTRAMUSCULAR | Status: DC | PRN
  Administered 2013-08-20 (×2): 50 ug via INTRAVENOUS

## 2013-08-20 MED ORDER — FENTANYL CITRATE 0.05 MG/ML IJ SOLN
INTRAMUSCULAR | Status: AC
  Filled 2013-08-20: qty 2

## 2013-08-20 MED ORDER — MIDAZOLAM HCL 5 MG/5ML IJ SOLN
INTRAMUSCULAR | Status: DC | PRN
  Administered 2013-08-20: 2 mg via INTRAVENOUS

## 2013-08-20 MED ORDER — LACTATED RINGERS IV SOLN
INTRAVENOUS | Status: DC | PRN
  Administered 2013-08-20: 14:00:00 via INTRAVENOUS

## 2013-08-20 MED ORDER — ACETAMINOPHEN 160 MG/5ML PO SOLN: 650.0000 mg | Freq: Four times a day (QID) | ORAL | Status: DC | PRN

## 2013-08-20 MED ORDER — MIDAZOLAM HCL 2 MG/2ML IJ SOLN
INTRAMUSCULAR | Status: AC
  Filled 2013-08-20: qty 2

## 2013-08-20 MED ORDER — KCL IN DEXTROSE-NACL 20-5-0.45 MEQ/L-%-% IV SOLN
INTRAVENOUS | Status: DC
  Administered 2013-08-20: 1000 mL via INTRAVENOUS
  Administered 2013-08-23: 18:00:00 via INTRAVENOUS
  Filled 2013-08-20 (×6): qty 1000

## 2013-08-20 MED ORDER — 0.9 % SODIUM CHLORIDE (POUR BTL) OPTIME
TOPICAL | Status: DC | PRN
  Administered 2013-08-20: 1000 mL

## 2013-08-20 MED ORDER — ALBUTEROL SULFATE (2.5 MG/3ML) 0.083% IN NEBU
2.5000 mg | INHALATION_SOLUTION | RESPIRATORY_TRACT | Status: DC | PRN
  Administered 2013-08-20: 2.5 mg via RESPIRATORY_TRACT
  Filled 2013-08-20 (×2): qty 3

## 2013-08-20 MED ORDER — BUPIVACAINE-EPINEPHRINE PF 0.25-1:200000 % IJ SOLN
INTRAMUSCULAR | Status: AC
  Filled 2013-08-20: qty 30

## 2013-08-20 MED ORDER — IPRATROPIUM BROMIDE 0.02 % IN SOLN
0.5000 mg | RESPIRATORY_TRACT | Status: DC | PRN
  Administered 2013-08-20: 0.5 mg via RESPIRATORY_TRACT
  Filled 2013-08-20: qty 2.5

## 2013-08-20 MED ORDER — CISATRACURIUM BESYLATE (PF) 10 MG/5ML IV SOLN
INTRAVENOUS | Status: DC | PRN
  Administered 2013-08-20: 6 mg via INTRAVENOUS

## 2013-08-20 MED ORDER — FENTANYL CITRATE 0.05 MG/ML IJ SOLN: 25.0000 ug | INTRAMUSCULAR | Status: DC | PRN

## 2013-08-20 MED ORDER — PROPOFOL 10 MG/ML IV BOLUS
INTRAVENOUS | Status: AC
  Filled 2013-08-20: qty 20

## 2013-08-20 SURGICAL SUPPLY — 28 items
BLADE SURG 15 STRL LF DISP TIS (BLADE) ×1 IMPLANT
BLADE SURG 15 STRL SS (BLADE) ×1
BNDG GAUZE ELAST 4 BULKY (GAUZE/BANDAGES/DRESSINGS) IMPLANT
CANISTER SUCTION 2500CC (MISCELLANEOUS) ×2 IMPLANT
COVER SURGICAL LIGHT HANDLE (MISCELLANEOUS) ×2 IMPLANT
DECANTER SPIKE VIAL GLASS SM (MISCELLANEOUS) IMPLANT
DRAPE LAPAROSCOPIC ABDOMINAL (DRAPES) IMPLANT
ELECT REM PT RETURN 9FT ADLT (ELECTROSURGICAL) ×2
ELECTRODE REM PT RTRN 9FT ADLT (ELECTROSURGICAL) ×1 IMPLANT
GLOVE BIO SURGEON STRL SZ7.5 (GLOVE) ×2 IMPLANT
GOWN STRL REUS W/TWL LRG LVL3 (GOWN DISPOSABLE) ×4 IMPLANT
KIT BASIN OR (CUSTOM PROCEDURE TRAY) ×2 IMPLANT
NEEDLE HYPO 25X1 1.5 SAFETY (NEEDLE) IMPLANT
NS IRRIG 1000ML POUR BTL (IV SOLUTION) ×2 IMPLANT
PAD ABD 8X10 STRL (GAUZE/BANDAGES/DRESSINGS) IMPLANT
PENCIL BUTTON HOLSTER BLD 10FT (ELECTRODE) ×2 IMPLANT
SPONGE GAUZE 4X4 12PLY (GAUZE/BANDAGES/DRESSINGS) IMPLANT
SPONGE LAP 18X18 X RAY DECT (DISPOSABLE) ×2 IMPLANT
SUT MNCRL AB 4-0 PS2 18 (SUTURE) IMPLANT
SUT VIC AB 3-0 SH 27 (SUTURE)
SUT VIC AB 3-0 SH 27XBRD (SUTURE) IMPLANT
SWAB COLLECTION DEVICE MRSA (MISCELLANEOUS) IMPLANT
SYR BULB 3OZ (MISCELLANEOUS) ×2 IMPLANT
SYR CONTROL 10ML LL (SYRINGE) IMPLANT
TOWEL OR 17X26 10 PK STRL BLUE (TOWEL DISPOSABLE) ×2 IMPLANT
TUBE ANAEROBIC SPECIMEN COL (MISCELLANEOUS) IMPLANT
WATER STERILE IRR 1000ML POUR (IV SOLUTION) IMPLANT
YANKAUER SUCT BULB TIP NO VENT (SUCTIONS) ×2 IMPLANT

## 2013-08-20 NOTE — Anesthesia Postprocedure Evaluation (Signed)
  Anesthesia Post-op Note  Patient: [name]  Procedure(s) Performed: Procedure(s) (LRB): MINOR APPLICATION OF WOUND VAC (N/A) INCISION AND DRAINAGE ABSCESS (N/A)  Patient Location: PACU  Anesthesia Type: General  Level of Consciousness: awake and alert   Airway and Oxygen Therapy: Patient Spontanous Breathing  Post-op Pain: mild  Post-op Assessment: Post-op Vital signs reviewed, Patient's Cardiovascular Status Stable, Respiratory Function Stable, Patent Airway and No signs of Nausea or vomiting  Last Vitals:  Filed Vitals:   08/20/13 2109  BP: 148/38  Pulse: 72  Temp:   Resp: 22    Post-op Vital Signs: stable   Complications: No apparent anesthesia complications

## 2013-08-20 NOTE — Progress Notes (Signed)
PULMONARY / CRITICAL CARE MEDICINE  Name: [name] MRN: [id number] DOB: 1929-09-05    ADMISSION DATE:  08/12/2013 CONSULTATION DATE:  08/13/2013  REFERRING MD :  TRH  CHIEF COMPLAINT:  Hypotension  BRIEF PATIENT DESCRIPTION:  80 y/o obese (250 lbs) female admitted 3/2 with R labial necrotizing fasciitis s/p debridement complicated by septic shock.  SIGNIFICANT EVENTS: 3/3  OR >>> Labia debridement 3/4  Oversedated with Dilaudid and required Narcan x 2  3/6  OR >>> Further debridement 3/8  Debrided at bedside, apneic with minimal sedation (Fentanyl 100 Versed 2)  3/9  Debridment  STUDIES:  3/3  Echo >>> EF 55 to 60%, mild LVH, grade 2 diastolic dysfx  LINES / TUBES: L rad A line  3/3 >>> 3/4 L IJ CVL 3/3 >>> OETT 3/3 >>>  CULTURES: 3/2  MRSA PCR >>> neg 3/2  Abscess >>> MULTIPLE ORGANISMS 3/2  Blood >>>neg  ANTIBIOTICS: Vancomycin 3/2 >>> Zosyn 3/2 >>> Clindamycin  3/2 >>>  INTERVAL HISTORY:   Plan for OR again today for wound vac.  ETT malpositioned >> repositioned and confirmed with CXR.  VITAL SIGNS: Temp:  [97.8 F (36.6 C)-99.4 F (37.4 C)] 98.2 F (36.8 C) (03/10 0400) Pulse Rate:  [48-89] 89 (03/10 0745) Resp:  [12-30] 28 (03/10 0745) BP: (100-180)/(34-97) 158/50 mmHg (03/10 0745) SpO2:  [96 %-100 %] 96 % (03/10 0745) FiO2 (%):  [40 %] 40 % (03/10 0747) Weight:  [295 lb 10.2 oz (134.1 kg)] 295 lb 10.2 oz (134.1 kg) (03/10 0400)  VENTILATOR SETTINGS: Vent Mode:  [-] PRVC FiO2 (%):  [40 %] 40 % Set Rate:  [15 bmp] 15 bmp Vt Set:  [500 mL] 500 mL PEEP:  [5 cmH20] 5 cmH20 Pressure Support:  [12 cmH20] 12 cmH20 Plateau Pressure:  [24 cmH20-32 cmH20] 26 cmH20  INTAKE / OUTPUT: Intake/Output     03/09 0701 - 03/10 0700 03/10 0701 - 03/11 0700   P.O. 30    I.V. (mL/kg) 2640 (19.7)    Other 430    NG/GT 460    IV Piggyback 600    Total Intake(mL/kg) 4160 (31)    Urine (mL/kg/hr) 910 (0.3)    Emesis/NG output     Stool     Total Output 910      Net +3250           PHYSICAL EXAMINATION: General: no distress Neuro: RASS 0, follows commands, moves extremities HEENT: ETT in place Cardiovascular:  Regular, no murmurs Lungs: no wheeze Abdomen: soft, non tender Musculoskeletal: 1+ edema Skin: no rashes  LABS:  CBC  Recent Labs Lab 08/18/13 0615 08/19/13 0445 08/20/13 0405  WBC 9.0 9.1 8.9  HGB 10.0* 9.8* 9.5*  HCT 31.9* 31.8* 31.0*  PLT 236 228 254   BMET  Recent Labs Lab 08/18/13 0615 08/19/13 0445 08/20/13 0405  NA 147 146 146  K 3.8 3.8 3.6*  CL 115* 113* 112  CO2 22 23 24   BUN 30* 29* 26*  CREATININE 1.08 1.03 0.99  GLUCOSE 117* 131* 139*   Electrolytes  Recent Labs Lab 08/14/13 0351 08/15/13 0215  08/18/13 0615 08/19/13 0445 08/20/13 0405  CALCIUM 8.4 8.6  < > 8.6 8.6 8.6  MG 1.8 1.7  --   --   --   --   PHOS 3.4 3.1  --   --   --   --   < > = values in this interval not displayed.  ABG  Recent Labs Lab 08/14/13  0800 08/14/13 1106  PHART 7.111* 7.264*  PCO2ART 69.4* 43.8  PO2ART 149.0* 318.0*   Cardiac Enzymes  Recent Labs Lab 08/17/13 0528  PROBNP 10639.0*   Glucose  Recent Labs Lab 08/19/13 0021 08/19/13 0822 08/19/13 1237 08/19/13 1735 08/19/13 1945 08/19/13 2326  GLUCAP 123* 119* 110* 111* 93 114*   IMAGING:   Dg Chest Port 1 View  08/20/2013   CLINICAL DATA:  Evaluate lung fields.  EXAM: PORTABLE CHEST - 1 VIEW  COMPARISON:  DG CHEST 1V PORT dated 08/18/2013  FINDINGS: Endotracheal tube and enteric tube appear unchanged. The left IJ central line has been advanced and the redundant loop in the neck is no longer visualized. The tip of the left IJ central line is at the confluence of the brachiocephalic veins. Bilateral pleural effusions, left-greater-than-right basilar predominant airspace disease and basilar predominant atelectasis is unchanged compared to prior. Cardiopericardial silhouette unchanged. Patient is rotated to the right.  IMPRESSION: 1. Adjustment of  support apparatus detailed above. 2. Unchanged appearance of the heart and lungs.   Electronically Signed   By: Dereck Ligas M.D.   On: 08/20/2013 07:48    ASSESSMENT / PLAN:  PULMONARY A:  Acute respiratory failure in setting of necrotizing fasciitis with septic shock >> requiring frequent OR trips for debridement. P:   Pressure support wean as tolerated >> defer extubation until done with OR trips F/u CXR Prn BD's  CARDIOVASCULAR A:  Septic shock 2nd to necrotizing fasciitis >> resolved. Hypertension, chronic diastolic CHF, hyperlipidemia. P:  ASA Prn hydralazine IV for SBP > 170 Hold home lasix, lisinopril  RENAL A:   AKI in setting of septic shock >> resolved. Hypokalemia. P:   F/u renal fx, urine outpt F/u and replace electrolytes as needed Change IV fluid to D5 1/2 NS at 50 ml/hr with 20 meq KCL  GASTROINTESTINAL A:  Nutrition. GI Px. P:   Protonix TF on hold for pending OR visit  HEMATOLOGIC / ONC A:   Anemia of critical illness Hx breast CA. P:  Trend CBC Anastrozole  Lovenox   INFECTIOUS A:   Necrotizing fasciitis Rt labia. P:   Day 9 of zosyn, clindamycin, vancomycin For wound vac 3/10 per CCS  ENDOCRINE A:   Hyperglycemia. P:   SSI  NEUROLOGIC A:   Sedation. Post-op pain. P:   Fentanyl / Versed PRN  Chesley Mires, MD [place] 08/20/2013, 7:52 AM Pager:  [phone] After 3pm call: [phone]

## 2013-08-20 NOTE — Progress Notes (Signed)
1 Day Post-Op  Subjective: Pt doing well with no acute changes.  Objective: Vital signs in last 24 hours: Temp:  [97.8 F (36.6 C)-99.4 F (37.4 C)] 98.2 F (36.8 C) (03/10 0400) Pulse Rate:  [48-88] 67 (03/10 0600) Resp:  [12-30] 22 (03/10 0600) BP: (100-180)/(34-97) 154/39 mmHg (03/10 0600) SpO2:  [97 %-100 %] 100 % (03/10 0600) FiO2 (%):  [40 %] 40 % (03/10 0358) Weight:  [295 lb 10.2 oz (134.1 kg)] 295 lb 10.2 oz (134.1 kg) (03/10 0400) Last BM Date: 08/19/13  Intake/Output from previous day: 03/09 0701 - 03/10 0700 In: 4160 [P.O.:30; I.V.:2640; NG/GT:460; IV Piggyback:600] Out: 485 [Urine:485] Intake/Output this shift:    General appearance: alert and cooperative GI: soft, non-tender; bowel sounds normal; no masses,  no organomegaly Incision/Wound: dressing in place  Lab Results:   Recent Labs  08/19/13 0445 08/20/13 0405  WBC 9.1 8.9  HGB 9.8* 9.5*  HCT 31.8* 31.0*  PLT 228 254   BMET  Recent Labs  08/19/13 0445 08/20/13 0405  NA 146 146  K 3.8 3.6*  CL 113* 112  CO2 23 24  GLUCOSE 131* 139*  BUN 29* 26*  CREATININE 1.03 0.99  CALCIUM 8.6 8.6   PT/INR No results found for this basename: LABPROT, INR,  in the last 72 hours ABG No results found for this basename: PHART, PCO2, PO2, HCO3,  in the last 72 hours  Studies/Results: No results found.  Anti-infectives: Anti-infectives   Start     Dose/Rate Route Frequency Ordered Stop   08/15/13 1800  vancomycin (VANCOCIN) IVPB 1000 mg/200 mL premix     1,000 mg 200 mL/hr over 60 Minutes Intravenous Every 24 hours 08/15/13 1414     08/14/13 1400  vancomycin (VANCOCIN) 1,500 mg in sodium chloride 0.9 % 500 mL IVPB  Status:  Discontinued     1,500 mg 250 mL/hr over 120 Minutes Intravenous Every 48 hours 08/12/13 1501 08/13/13 1032   08/13/13 1400  vancomycin (VANCOCIN) 1,500 mg in sodium chloride 0.9 % 500 mL IVPB  Status:  Discontinued     1,500 mg 250 mL/hr over 120 Minutes Intravenous Every 24  hours 08/13/13 1032 08/15/13 1411   08/12/13 1800  piperacillin-tazobactam (ZOSYN) IVPB 3.375 g     3.375 g 12.5 mL/hr over 240 Minutes Intravenous Every 8 hours 08/12/13 1737     08/12/13 1730  clindamycin (CLEOCIN) IVPB 600 mg     600 mg 100 mL/hr over 30 Minutes Intravenous 3 times per day 08/12/13 1729     08/12/13 1445  vancomycin (VANCOCIN) 2,000 mg in sodium chloride 0.9 % 500 mL IVPB     2,000 mg 250 mL/hr over 120 Minutes Intravenous STAT 08/12/13 1432 08/13/13 1900      Assessment/Plan: s/p Procedure(s): Dressing change perineum (N/A)  LOS: 8 days  To OR for wound Vac placement today, may be able to wean to extubate thereafter Would likely need a change on fri in OR if goes well Con't abx   Rosario Jacks., Anne Hahn 08/20/2013

## 2013-08-20 NOTE — Progress Notes (Signed)
CARE MANAGEMENT NOTE 08/20/2013  Patient:  [name]   Account Number:  [id number]  Date Initiated:  08/15/2013  Documentation initiated by:  Dior Stepter  Subjective/Objective Assessment:   patient transferred from med surg floor on [id number] after i and d for ncrotizing facsitis of the labia and requiring intubation.     Action/Plan:   tbd based on progress of resp and surg conditions.   Anticipated DC Date:  08/23/2013   Anticipated DC Plan:  [place]  In-house referral  NA      DC Planning Services  NA      [facility] Choice  NA   Choice offered to / List presented to:  NA   DME arranged  NA      DME agency  NA     [place] arranged  NA      [place] agency  NA   Status of service:  In process, will continue to follow Medicare Important Message given?  NA - LOS <3 / Initial given by admissions (If response is "NO", the following Medicare IM given date fields will be blank) Date Medicare IM given:   Date Additional Medicare IM given:    Discharge Disposition:    Per UR Regulation:  Reviewed for med. necessity/level of care/duration of stay  If discussed at [place] of Stay Meetings, dates discussed:   08/20/2013    Comments:  [id number]/[name] Rosana Hoes RN, BSN, [place] [phone] Chart Reviewed for discharge and hospital needs. Discharge needs at time of review:  None present will follow for needs. Review of patient progress due on [id number]. patient continues to require full vent support and returning trips to O.R. due to right labia necrotizing fascitis. To O.R. For debridement and labial dressing change/ vent day-6/icu day 6  [id number]/[name] [name], BSN, [place] [phone] Chart Reviewed for discharge and hospital needs. Discharge needs at time of review:  None present will follow for needs. Review of patient progress due on [id number]. pod 1/ vent day 1/ icu day 1

## 2013-08-20 NOTE — Anesthesia Postprocedure Evaluation (Signed)
  Anesthesia Post-op Note  Patient: Vina A Lyness  Procedure(s) Performed: Procedure(s) (LRB): Dressing change perineum (N/A)  Patient Location: PACU  Anesthesia Type: General  Level of Consciousness: awake and alert   Airway and Oxygen Therapy: Patient Spontanous Breathing  Post-op Pain: mild  Post-op Assessment: Post-op Vital signs reviewed, Patient's Cardiovascular Status Stable, Respiratory Function Stable, Patent Airway and No signs of Nausea or vomiting  Last Vitals:  Filed Vitals:   08/20/13 0745  BP: 158/50  Pulse: 89  Temp:   Resp: 28    Post-op Vital Signs: stable   Complications: No apparent anesthesia complications

## 2013-08-20 NOTE — Transfer of Care (Signed)
Immediate Anesthesia Transfer of Care Note  Patient: [name]  Procedure(s) Performed: Procedure(s): MINOR APPLICATION OF WOUND VAC (N/A) INCISION AND DRAINAGE ABSCESS (N/A)  Patient Location: ICU  Anesthesia Type:General  Level of Consciousness: unresponsive and Patient remains intubated per anesthesia plan  Airway & Oxygen Therapy: Patient remains intubated per anesthesia plan and Patient placed on Ventilator (see vital sign flow sheet for setting)  Post-op Assessment: Report given to PACU RN and Post -op Vital signs reviewed and stable  Post vital signs: Reviewed and stable  Complications: No apparent anesthesia complications

## 2013-08-20 NOTE — Progress Notes (Signed)
Inpatient Diabetes Program Recommendations  AACE/ADA: New Consensus Statement on Inpatient Glycemic Control (2013)  Target Ranges:  Prepandial:   less than 140 mg/dL      Peak postprandial:   less than 180 mg/dL (1-2 hours)      Critically ill patients:  140 - 180 mg/dL   Reason for Visit: Glycemic control  Diabetes history: Type 2 (no meds) Outpatient Diabetes medications: None Current orders for Inpatient glycemic control: Novolog sensitive tidwc Results for SARAHA, GUGGENHEIM A (MRN [id number]) as of 08/20/2013 09:23  Ref. Range 08/19/2013 04:45 08/20/2013 04:05  Glucose Latest Range: 70-99 mg/dL 131 (H) 139 (H)  Results for Slaubaugh, Iliza A (MRN [id number]) as of 08/20/2013 09:23  Ref. Range 08/12/2013 21:45  Hemoglobin A1C Latest Range: <5.7 % 7.7 (H)    Inpatient Diabetes Program Recommendations Correction (SSI): Change Novolog sensitive to Q4H while NPO. Insulin - Meal Coverage: If TF is restarted, consider CHO coverage Q4H. HgbA1C: 7.7%  Note: Will follow. Thank you. Lorenda Peck, RD, LDN, CDE Inpatient Diabetes Coordinator [phone]

## 2013-08-20 NOTE — Anesthesia Preprocedure Evaluation (Signed)
Anesthesia Evaluation  Patient identified by MRN, date of birth, ID band Patient awake    Reviewed: Allergy & Precautions, H&P , NPO status , Patient's Chart, lab work & pertinent test results  History of Anesthesia Complications (+) PONV  Airway Mallampati: II TM Distance: >3 FB Neck ROM: Full    Dental no notable dental hx.    Pulmonary shortness of breath, pneumonia -,  breath sounds clear to auscultation  Pulmonary exam normal       Cardiovascular hypertension, + Peripheral Vascular Disease negative cardio ROS  + dysrhythmias Rhythm:Regular Rate:Normal     Neuro/Psych Anxiety  Neuromuscular disease CVA    GI/Hepatic Neg liver ROS, hiatal hernia, GERD-  ,  Endo/Other  diabetesMorbid obesity  Renal/GU Renal disease  negative genitourinary   Musculoskeletal negative musculoskeletal ROS (+)   Abdominal   Peds negative pediatric ROS (+)  Hematology  (+) anemia ,   Anesthesia Other Findings   Reproductive/Obstetrics negative OB ROS                           Anesthesia Physical  Anesthesia Plan  ASA: IV  Anesthesia Plan: General   Post-op Pain Management:    Induction: Intravenous  Airway Management Planned: Oral ETT  Additional Equipment:   Intra-op Plan:   Post-operative Plan:   Informed Consent: I have reviewed the patients History and Physical, chart, labs and discussed the procedure including the risks, benefits and alternatives for the proposed anesthesia with the patient or authorized representative who has indicated his/her understanding and acceptance.   Dental advisory given  Plan Discussed with: CRNA  Anesthesia Plan Comments:         Anesthesia Quick Evaluation

## 2013-08-20 NOTE — Op Note (Signed)
08/12/2013 - 08/20/2013  3:19 PM  PATIENT:  [name]  80 y.o. female  PRE-OPERATIVE DIAGNOSIS:  WOUND VAC  POST-OPERATIVE DIAGNOSIS:  WOUND VAC  PROCEDURE:  Procedure(s): MINOR APPLICATION OF WOUND VAC (N/A) D&I of ABSCESS (N/A)  SURGEON:  Surgeon(s) and Role:    * Ralene Ok, MD - Primary  PHYSICIAN ASSISTANT:   ASSISTANTS: none   ANESTHESIA:   general  EBL:  Total I/O In: 786.7 [I.V.:336.7; Other:350; IV Piggyback:100] Out: -   BLOOD ADMINISTERED:none  DRAINS: wound vac placement   LOCAL MEDICATIONS USED:  NONE  SPECIMEN:  No Specimen  DISPOSITION OF SPECIMEN:  N/A  COUNTS:  YES  TOURNIQUET:  * No tourniquets in log *  DICTATION: .Dragon Dictation After being consented the pt was taken back to the OR and placed in the lithotomy position.  After being prepped and draped in the usual sterile fashion a time-out was called and all facts were verified.  The area was irrigated out with sterile saline. There was and thoroughly dried. A large black sponge of the wound VAC was then placed into the wound. Skin staples were used to reapproximate the response to the skin. At this time multiple strips of sticky adhesive were then placed over the sponge. This was hooked up to suction. This helped suction well. There was a small area just medial near the anus which required several trips to obtain appropriate suction. Upon leaving the operating room the suction was well intact without any leaks.   This completed the operation the patientWas taken back to the ICU intubated and in stable condition. Patient tolerated the procedure well.  PLAN OF CARE: order already for admission  PATIENT DISPOSITION:  ICU - intubated and hemodynamically stable.   Delay start of Pharmacological VTE agent (>24hrs) due to surgical blood loss or risk of bleeding: no

## 2013-08-21 ENCOUNTER — Inpatient Hospital Stay: Payer: Medicare Other

## 2013-08-21 ENCOUNTER — Inpatient Hospital Stay (HOSPITAL_COMMUNITY): Payer: Medicare Other

## 2013-08-21 ENCOUNTER — Encounter (HOSPITAL_COMMUNITY): Payer: Self-pay | Admitting: General Surgery

## 2013-08-21 LAB — CBC
HCT: 28.9 % — ABNORMAL LOW (ref 36.0–46.0)
Hemoglobin: 9.2 g/dL — ABNORMAL LOW (ref 12.0–15.0)
MCH: 30.2 pg (ref 26.0–34.0)
MCHC: 31.8 g/dL (ref 30.0–36.0)
MCV: 94.8 fL (ref 78.0–100.0)
Platelets: 239 10*3/uL (ref 150–400)
RBC: 3.05 MIL/uL — ABNORMAL LOW (ref 3.87–5.11)
RDW: 17.6 % — ABNORMAL HIGH (ref 11.5–15.5)
WBC: 8.8 10*3/uL (ref 4.0–10.5)

## 2013-08-21 LAB — CLOSTRIDIUM DIFFICILE BY PCR: Toxigenic C. Difficile by PCR: NEGATIVE

## 2013-08-21 LAB — GLUCOSE, CAPILLARY
Glucose-Capillary: 117 mg/dL — ABNORMAL HIGH (ref 70–99)
Glucose-Capillary: 114 mg/dL — ABNORMAL HIGH (ref 70–99)
Glucose-Capillary: 109 mg/dL — ABNORMAL HIGH (ref 70–99)
Glucose-Capillary: 114 mg/dL — ABNORMAL HIGH (ref 70–99)

## 2013-08-21 LAB — BASIC METABOLIC PANEL
BUN: 25 mg/dL — ABNORMAL HIGH (ref 6–23)
CO2: 24 mEq/L (ref 19–32)
Calcium: 8.6 mg/dL (ref 8.4–10.5)
Chloride: 113 mEq/L — ABNORMAL HIGH (ref 96–112)
Creatinine, Ser: 0.99 mg/dL (ref 0.50–1.10)
GFR calc Af Amer: 60 mL/min — ABNORMAL LOW (ref >90)
GFR calc non Af Amer: 52 mL/min — ABNORMAL LOW (ref >90)
Glucose, Bld: 136 mg/dL — ABNORMAL HIGH (ref 70–99)
Potassium: 3.5 mEq/L — ABNORMAL LOW (ref 3.7–5.3)
Sodium: 145 mEq/L (ref 137–147)

## 2013-08-21 MED ORDER — FUROSEMIDE 10 MG/ML IJ SOLN
40.0000 mg | Freq: Four times a day (QID) | INTRAMUSCULAR | Status: AC
  Administered 2013-08-21 (×2): 40 mg via INTRAVENOUS
  Filled 2013-08-21 (×2): qty 4

## 2013-08-21 MED ORDER — FENTANYL CITRATE 0.05 MG/ML IJ SOLN
100.0000 ug | Freq: Once | INTRAMUSCULAR | Status: AC
  Administered 2013-08-21: 100 ug via INTRAVENOUS

## 2013-08-21 MED ORDER — LOPERAMIDE HCL 1 MG/5ML PO LIQD
1.0000 mg | ORAL | Status: DC | PRN
  Filled 2013-08-21: qty 5

## 2013-08-21 MED ORDER — LOPERAMIDE HCL 1 MG/5ML PO LIQD
2.0000 mg | ORAL | Status: DC | PRN
  Administered 2013-08-21: 2 mg
  Filled 2013-08-21: qty 10

## 2013-08-21 MED ORDER — VITAL HIGH PROTEIN PO LIQD
1000.0000 mL | ORAL | Status: DC
  Administered 2013-08-22: 1000 mL
  Filled 2013-08-21: qty 1000

## 2013-08-21 NOTE — Progress Notes (Signed)
The wound vac cannot be sealed.  She is stooling thru the wound vac.  TF to be held for now.   The wound looks good.  It took 4 people and 150 mcg of fentanyl to do the dressing change. Positioning her and holding her leg is the issue.

## 2013-08-21 NOTE — Progress Notes (Signed)
ANTIBIOTIC CONSULT NOTE - Follow Up  Pharmacy Consult for vancomycin and zosyn Indication: necrotizing fasciitis of buttock  Allergies  Allergen Reactions  . Ace Inhibitors Other (See Comments)    Tolerates lisinopril at home. Pt does not recall allergy.  . Atorvastatin     REACTION: myalgias  . Oxycodone-Acetaminophen     REACTION: confusion, fatigue  . Rosuvastatin     REACTION: leg weakness  . Simvastatin     REACTION: leg weakness  . Codeine Nausea And Vomiting and Rash  . Sulfonamide Derivatives Hives and Rash    Patient Measurements: Height: 5\' 2"  (157.5 cm) Weight: 302 lb 7.5 oz (137.2 kg) IBW/kg (Calculated) : 50.1   Vital Signs: Temp: 98.5 F (36.9 C) (03/11 0800) Temp src: Oral (03/11 0800) BP: 183/54 mmHg (03/11 0800) Pulse Rate: 66 (03/11 0800) Intake/Output from previous day: 03/10 0701 - 03/11 0700 In: 3099.7 [I.V.:1739.7; NG/GT:510; IV Piggyback:500] Out: 575 [Urine:575] Intake/Output from this shift: Total I/O In: 250 [NG/GT:150; IV Piggyback:100] Out: -   Labs:  Recent Labs  08/19/13 0445 08/20/13 0405 08/21/13 0500  WBC 9.1 8.9 8.8  HGB 9.8* 9.5* 9.2*  PLT 228 254 239  CREATININE 1.03 0.99 0.99  Estimated CrCl ~ 50 mL/min/72 kg (wt-normalized)   Estimated Creatinine Clearance: 59.7 ml/min (by C-G formula based on Cr of 0.99).  Recent Labs  08/18/13 1613  VANCOTROUGH 17.7     Microbiology: 3/2 blood x 2: NGF 3/2: abscess cx: pending, gram stain showing mod GPC in pairs and few GNR, no anaerobes isolated to date 3/2 MRSA PCR screen: negative 3/11 Cdiff:  Assessment: [name] presented to ED with 3 day history of abscess on L buttock along with generalized weakness.  Pharmacy consulted for Vancomycin and Zosyn management, MD dosing clindamycin for septic shock 2/2 buttock abscess, necrotizing fasciitis of perineum s/p multiple debridements and drainage.   Day #10 Vancomcyin / Zosyn / Clindamycin  Tmax: 99.1, currently  Afeb  WBCs: improved to WNL  AKI, SCr now improving daily off ACEI and lasix, 0.99 today  Vancomycin levels and dose changes: 3/2: Vanc 2g in ED, then 1500mg  q48h 3/3: Increase Vanc to 1500 mg q24h for improved SCr 3/5: Trough = 19.5 mcg/ml on 1500 mg q24h (not at steady state but wanted to make sure achieving adequate conc in obese pt - reduced 1g q24h. 3/8: VT at 17:30 = 17.7 mg/ml - continue 1g q24h   Goal of Therapy:  Vancomycin trough 15-20 mcg/ml Doses appropriate for renal function  Plan:   Continue Vancomycin 1g IV q24h - consider recheck trough in 3-4 days to r/o accumulation  Continue Zosyn 3.375gm IV q8h (4hr extended infusions)  Continue Clindamycin 600mg  IV q8h per MD Follow up renal function & cultures, duration of therapy   Peggyann Juba, PharmD, BCPS Pager: [phone] 08/21/2013 10:36 AM

## 2013-08-21 NOTE — Therapy (Signed)
Pt transported to OR with ambu bag and 100% oxygen.

## 2013-08-21 NOTE — Progress Notes (Signed)
PULMONARY / CRITICAL CARE MEDICINE  Name: [name] MRN: [id number] DOB: 1930/01/30    ADMISSION DATE:  08/12/2013 CONSULTATION DATE:  08/13/2013  REFERRING MD :  TRH  CHIEF COMPLAINT:  Hypotension  BRIEF PATIENT DESCRIPTION:  80 y/o obese (250 lbs) female admitted 3/2 with R labial necrotizing fasciitis s/p debridement complicated by septic shock.  SIGNIFICANT EVENTS: 3/3  OR >>> Labia debridement 3/4  Oversedated with Dilaudid and required Narcan x 2  3/6  OR >>> Further debridement 3/8  Debrided at bedside, apneic with minimal sedation (Fentanyl 100 Versed 2)  3/9  Debridment 3/11 Diarrhea  STUDIES:  3/3  Echo >>> EF 55 to 60%, mild LVH, grade 2 diastolic dysfx  LINES / TUBES: L rad A line  3/3 >>> 3/4 L IJ CVL 3/3 >>> OETT 3/3 >>>  CULTURES: 3/2  MRSA PCR >>> neg 3/2  Abscess >>> MULTIPLE ORGANISMS 3/2  Blood >>>neg 3/11 C diff >>>  ANTIBIOTICS: Vancomycin 3/2 >>> Zosyn 3/2 >>> Clindamycin  3/2 >>>  INTERVAL HISTORY:   No further plans for OR trips.  Tolerating pressure support.  VITAL SIGNS: Temp:  [98.2 F (36.8 C)-99.1 F (37.3 C)] 98.5 F (36.9 C) (03/11 0800) Pulse Rate:  [57-84] 84 (03/11 1015) Resp:  [15-22] 19 (03/11 1015) BP: (112-183)/(31-79) 170/57 mmHg (03/11 1015) SpO2:  [93 %-100 %] 96 % (03/11 1015) FiO2 (%):  [40 %] 40 % (03/11 1100) Weight:  [302 lb 7.5 oz (137.2 kg)] 302 lb 7.5 oz (137.2 kg) (03/11 0400)  VENTILATOR SETTINGS: Vent Mode:  [-] PSV FiO2 (%):  [40 %] 40 % Set Rate:  [15 bmp] 15 bmp Vt Set:  [500 mL] 500 mL PEEP:  [5 cmH20] 5 cmH20 Pressure Support:  [12 cmH20] 12 cmH20 Plateau Pressure:  [23 cmH20-29 cmH20] 23 cmH20  INTAKE / OUTPUT: Intake/Output     03/10 0701 - 03/11 0700 03/11 0701 - 03/12 0700   P.O.     I.V. (mL/kg) 1739.7 (12.7)    Other 350    NG/GT 510 150   IV Piggyback 500 100   Total Intake(mL/kg) 3099.7 (22.6) 250 (1.8)   Urine (mL/kg/hr) 575 (0.2)    Total Output 575     Net +2524.7 +250          PHYSICAL EXAMINATION: General: no distress Neuro: RASS 0, follows commands, moves extremities HEENT: ETT in place Cardiovascular:  Regular, no murmurs Lungs: no wheeze Abdomen: soft, non tender Musculoskeletal: 1+ edema Skin: no rashes  LABS:  CBC  Recent Labs Lab 08/19/13 0445 08/20/13 0405 08/21/13 0500  WBC 9.1 8.9 8.8  HGB 9.8* 9.5* 9.2*  HCT 31.8* 31.0* 28.9*  PLT 228 254 239   BMET  Recent Labs Lab 08/19/13 0445 08/20/13 0405 08/21/13 0500  NA 146 146 145  K 3.8 3.6* 3.5*  CL 113* 112 113*  CO2 23 24 24   BUN 29* 26* 25*  CREATININE 1.03 0.99 0.99  GLUCOSE 131* 139* 136*   Electrolytes  Recent Labs Lab 08/15/13 0215  08/19/13 0445 08/20/13 0405 08/21/13 0500  CALCIUM 8.6  < > 8.6 8.6 8.6  MG 1.7  --   --   --   --   PHOS 3.1  --   --   --   --   < > = values in this interval not displayed.  Cardiac Enzymes  Recent Labs Lab 08/17/13 0528  PROBNP 10639.0*   Glucose  Recent Labs Lab 08/19/13 2326 08/20/13 0757 08/20/13  1255 08/20/13 1617 08/20/13 2130 08/21/13 0752  GLUCAP 114* 100* 103* 122* 120* 117*   IMAGING:   Dg Chest Port 1 View  08/21/2013   CLINICAL DATA Follow-up atelectasis.  EXAM PORTABLE CHEST - 1 VIEW  COMPARISON DG CHEST 1V PORT dated 08/20/2013  FINDINGS The cardiac silhouette appears mild to moderately enlarged, similar. Mediastinal silhouette is nonsuspicious, mildly calcified aortic knob. Similar mild interstitial prominence, with bibasilar airspace opacities and small pleural effusion, similar. Strandy midlung zone densities likely reflect atelectasis. No pneumothorax.  Endotracheal tube tip projects 2.4 cm above the carina. Left internal jugular central venous catheter with distal tip approximating the proximal superior vena cava. Nasogastric tube past the GE junction the distal tip not imaged. Multiple EKG lines overlie the patient and may obscure subtle underlying pathology.  IMPRESSION No apparent change in  life-support lines.  Stable cardiomegaly, interstitial prominence likely reflects pulmonary edema with bibasilar airspace opacities, midlung zone atelectasis and small pleural effusions, similar.  SIGNATURE  Electronically Signed   By: Elon Alas   On: 08/21/2013 06:55   Dg Chest Port 1 View  08/20/2013   CLINICAL DATA:  Hypoxia  EXAM: PORTABLE CHEST - 1 VIEW  COMPARISON:  Study obtained earlier in the day  FINDINGS: Endotracheal tube tip is 2.4 cm above the carina. Nasogastric tube in side-port extend below the diaphragm. Central catheter tip is in the superior vena cava just beyond the junction with the left innominate vein. No pneumothorax.  There is cardiomegaly with bilateral effusions and patchy left and to a lesser extent right mid lung atelectatic change. There is no new opacity.  IMPRESSION: Tube and catheter positions as described without pneumothorax. Evidence of a degree of underlying congestive heart failure. Patchy mid lung atelectatic change bilaterally, stable.   Electronically Signed   By: Lowella Grip M.D.   On: 08/20/2013 07:58   Dg Chest Port 1 View  08/20/2013   CLINICAL DATA:  Evaluate lung fields.  EXAM: PORTABLE CHEST - 1 VIEW  COMPARISON:  DG CHEST 1V PORT dated 08/18/2013  FINDINGS: Endotracheal tube and enteric tube appear unchanged. The left IJ central line has been advanced and the redundant loop in the neck is no longer visualized. The tip of the left IJ central line is at the confluence of the brachiocephalic veins. Bilateral pleural effusions, left-greater-than-right basilar predominant airspace disease and basilar predominant atelectasis is unchanged compared to prior. Cardiopericardial silhouette unchanged. Patient is rotated to the right.  IMPRESSION: 1. Adjustment of support apparatus detailed above. 2. Unchanged appearance of the heart and lungs.   Electronically Signed   By: Dereck Ligas M.D.   On: 08/20/2013 07:48    ASSESSMENT / PLAN:  PULMONARY A:   Acute respiratory failure in setting of necrotizing fasciitis with septic shock >> requiring frequent OR trips for debridement. P:   Pressure support wean as tolerated >> may be able to extubate soon F/u CXR Prn BD's  CARDIOVASCULAR A:  Septic shock 2nd to necrotizing fasciitis >> resolved. Hypertension, chronic diastolic CHF, hyperlipidemia. Hypervolemia P:  ASA Prn hydralazine IV for SBP > 170 Lasix 40 mg IV q6h x 2 doses on 3/11 Hold home lisinopril  RENAL A:   AKI in setting of septic shock >> resolved. Hypokalemia. P:   F/u renal fx, urine outpt F/u and replace electrolytes as needed Changed IV fluid to D5 1/2 NS at 50 ml/hr with 20 meq KCL on 3/10 >> continue for now  GASTROINTESTINAL A:  Nutrition. GI Px.  P:   Protonix Per CCS  HEMATOLOGIC / ONC A:   Anemia of critical illness Hx breast CA. P:  Trend CBC Anastrozole  Lovenox   INFECTIOUS A:   Necrotizing fasciitis Rt labia. Diarrhea develop 3/11. P:   Day 10 of zosyn, clindamycin, vancomycin >> ? If can narrow abx soon F/u stool C diff PCR  ENDOCRINE A:   Hyperglycemia. P:   SSI  NEUROLOGIC A:   Sedation. Post-op pain. P:   Fentanyl / Versed PRN  Updated husband at bedside.  CC time 35 minutes.  Chesley Mires, MD [facility] Pulmonary/Critical Care 08/21/2013, 11:33 AM Pager:  [phone] After 3pm call: [phone]

## 2013-08-21 NOTE — Progress Notes (Signed)
1 Day Post-Op  Subjective: VAC held suction until she was turned this AM No new chages  Objective: Vital signs in last 24 hours: Temp:  [98.2 F (36.8 C)-99.1 F (37.3 C)] 99.1 F (37.3 C) (03/11 0400) Pulse Rate:  [57-89] 69 (03/11 0600) Resp:  [15-28] 21 (03/11 0600) BP: (112-167)/(31-79) 159/53 mmHg (03/11 0600) SpO2:  [93 %-100 %] 93 % (03/11 0600) FiO2 (%):  [40 %] 40 % (03/11 0353) Weight:  [302 lb 7.5 oz (137.2 kg)] 302 lb 7.5 oz (137.2 kg) (03/11 0400) Last BM Date: 08/19/13  Intake/Output from previous day: 03/10 0701 - 03/11 0700 In: 2379.7 [I.V.:1019.7; NG/GT:510; IV Piggyback:500] Out: 575 [Urine:575] Intake/Output this shift:    General appearance: alert and cooperative GI: soft, non-tender; bowel sounds normal; no masses,  no organomegaly Incision/Wound: Vac in place  Lab Results:   Recent Labs  08/20/13 0405 08/21/13 0500  WBC 8.9 8.8  HGB 9.5* 9.2*  HCT 31.0* 28.9*  PLT 254 239   BMET  Recent Labs  08/20/13 0405 08/21/13 0500  NA 146 145  K 3.6* 3.5*  CL 112 113*  CO2 24 24  GLUCOSE 139* 136*  BUN 26* 25*  CREATININE 0.99 0.99  CALCIUM 8.6 8.6   PT/INR No results found for this basename: LABPROT, INR,  in the last 72 hours ABG No results found for this basename: PHART, PCO2, PO2, HCO3,  in the last 72 hours  Studies/Results: Dg Chest Port 1 View  08/21/2013   CLINICAL DATA Follow-up atelectasis.  EXAM PORTABLE CHEST - 1 VIEW  COMPARISON DG CHEST 1V PORT dated 08/20/2013  FINDINGS The cardiac silhouette appears mild to moderately enlarged, similar. Mediastinal silhouette is nonsuspicious, mildly calcified aortic knob. Similar mild interstitial prominence, with bibasilar airspace opacities and small pleural effusion, similar. Strandy midlung zone densities likely reflect atelectasis. No pneumothorax.  Endotracheal tube tip projects 2.4 cm above the carina. Left internal jugular central venous catheter with distal tip approximating the  proximal superior vena cava. Nasogastric tube past the GE junction the distal tip not imaged. Multiple EKG lines overlie the patient and may obscure subtle underlying pathology.  IMPRESSION No apparent change in life-support lines.  Stable cardiomegaly, interstitial prominence likely reflects pulmonary edema with bibasilar airspace opacities, midlung zone atelectasis and small pleural effusions, similar.  SIGNATURE  Electronically Signed   By: Elon Alas   On: 08/21/2013 06:55   Dg Chest Port 1 View  08/20/2013   CLINICAL DATA:  Hypoxia  EXAM: PORTABLE CHEST - 1 VIEW  COMPARISON:  Study obtained earlier in the day  FINDINGS: Endotracheal tube tip is 2.4 cm above the carina. Nasogastric tube in side-port extend below the diaphragm. Central catheter tip is in the superior vena cava just beyond the junction with the left innominate vein. No pneumothorax.  There is cardiomegaly with bilateral effusions and patchy left and to a lesser extent right mid lung atelectatic change. There is no new opacity.  IMPRESSION: Tube and catheter positions as described without pneumothorax. Evidence of a degree of underlying congestive heart failure. Patchy mid lung atelectatic change bilaterally, stable.   Electronically Signed   By: Lowella Grip M.D.   On: 08/20/2013 07:58   Dg Chest Port 1 View  08/20/2013   CLINICAL DATA:  Evaluate lung fields.  EXAM: PORTABLE CHEST - 1 VIEW  COMPARISON:  DG CHEST 1V PORT dated 08/18/2013  FINDINGS: Endotracheal tube and enteric tube appear unchanged. The left IJ central line has been advanced and  the redundant loop in the neck is no longer visualized. The tip of the left IJ central line is at the confluence of the brachiocephalic veins. Bilateral pleural effusions, left-greater-than-right basilar predominant airspace disease and basilar predominant atelectasis is unchanged compared to prior. Cardiopericardial silhouette unchanged. Patient is rotated to the right.  IMPRESSION: 1.  Adjustment of support apparatus detailed above. 2. Unchanged appearance of the heart and lungs.   Electronically Signed   By: Dereck Ligas M.D.   On: 08/20/2013 07:48    Anti-infectives: Anti-infectives   Start     Dose/Rate Route Frequency Ordered Stop   08/15/13 1800  vancomycin (VANCOCIN) IVPB 1000 mg/200 mL premix     1,000 mg 200 mL/hr over 60 Minutes Intravenous Every 24 hours 08/15/13 1414     08/14/13 1400  vancomycin (VANCOCIN) 1,500 mg in sodium chloride 0.9 % 500 mL IVPB  Status:  Discontinued     1,500 mg 250 mL/hr over 120 Minutes Intravenous Every 48 hours 08/12/13 1501 08/13/13 1032   08/13/13 1400  vancomycin (VANCOCIN) 1,500 mg in sodium chloride 0.9 % 500 mL IVPB  Status:  Discontinued     1,500 mg 250 mL/hr over 120 Minutes Intravenous Every 24 hours 08/13/13 1032 08/15/13 1411   08/12/13 1800  piperacillin-tazobactam (ZOSYN) IVPB 3.375 g     3.375 g 12.5 mL/hr over 240 Minutes Intravenous Every 8 hours 08/12/13 1737     08/12/13 1730  clindamycin (CLEOCIN) IVPB 600 mg     600 mg 100 mL/hr over 30 Minutes Intravenous 3 times per day 08/12/13 1729     08/12/13 1445  vancomycin (VANCOCIN) 2,000 mg in sodium chloride 0.9 % 500 mL IVPB     2,000 mg 250 mL/hr over 120 Minutes Intravenous STAT 08/12/13 1432 08/13/13 1900      Assessment/Plan: s/p Procedure(s): MINOR APPLICATION OF WOUND VAC (N/A) INCISION AND DRAINAGE ABSCESS (N/A)  Will have RN ask if wound RN can patch up leaking area of vac.  If not successful will likely just need con't wet to dry dressing changes OK to try and wean to extubate as no plans to return to the OR as of now Con't abx   LOS: 9 days    Rosario Jacks., [facility] 08/21/2013

## 2013-08-21 NOTE — Consult Note (Signed)
WOC wound consult note Reason for Consult: evaluate leaking NPWT VAC dressing. Pt was taken OR 08/19/13; 08/20/13 Wound type: perineal surgical wound Measurement: did not measure today Wound bed: beefy red, clean Drainage (amount, consistency, odor) moderate Periwound:intact, she does have MASD over the bilateral buttocks.  She is stooling quite frequently Dressing procedure/placement/frequency: Arrived to assess the [facility] dressing that was placed in the OR 08/11/13, it lost its seal this am and WOC was consulted to trouble shoot. When I arrived patient had been disconnected from [facility] but black granufoam left in place, noted when I tried to assess the wound that the foam has been stapled into place. While assessing the wound patient stooled and the stool came through the VAC sponge. Contacted CCS at that point.  Assessment of wound with CCS and 3 other nurses at the bedside. 150 mg Fentanyl administered during the dressing change.  Assisted Will PA to remove the VAC sponge, staples removed.  1/2 of large VAC sponge in this wound, which extends along the labia and perineum to the anus and a portion of the buttock.  Once VAC foam removed decision made to repack with kerlix gauze (2 entire rolls used).  Top with ABD pads and to be change BID and PRN soilage.   Dr. Rosendo Gros arrived after dressing changed.  Took the assistance of 4 staff members to change this dressing.  Visit over 1 hour in duration.    [place] team will not follow but will remain available, please reconsult if needed.   Ixchel Duck [place] RN,CWOCN [phone]

## 2013-08-22 ENCOUNTER — Inpatient Hospital Stay (HOSPITAL_COMMUNITY): Payer: Medicare Other

## 2013-08-22 LAB — BASIC METABOLIC PANEL
BUN: 23 mg/dL (ref 6–23)
CO2: 28 mEq/L (ref 19–32)
Calcium: 8.5 mg/dL (ref 8.4–10.5)
Chloride: 108 mEq/L (ref 96–112)
Creatinine, Ser: 1.02 mg/dL (ref 0.50–1.10)
GFR calc Af Amer: 58 mL/min — ABNORMAL LOW (ref >90)
GFR calc non Af Amer: 50 mL/min — ABNORMAL LOW (ref >90)
Glucose, Bld: 111 mg/dL — ABNORMAL HIGH (ref 70–99)
Potassium: 3.2 mEq/L — ABNORMAL LOW (ref 3.7–5.3)
Sodium: 145 mEq/L (ref 137–147)

## 2013-08-22 LAB — GLUCOSE, CAPILLARY
Glucose-Capillary: 128 mg/dL — ABNORMAL HIGH (ref 70–99)
Glucose-Capillary: 103 mg/dL — ABNORMAL HIGH (ref 70–99)
Glucose-Capillary: 124 mg/dL — ABNORMAL HIGH (ref 70–99)
Glucose-Capillary: 106 mg/dL — ABNORMAL HIGH (ref 70–99)
Glucose-Capillary: 108 mg/dL — ABNORMAL HIGH (ref 70–99)

## 2013-08-22 LAB — CBC
HCT: 29.6 % — ABNORMAL LOW (ref 36.0–46.0)
Hemoglobin: 9.2 g/dL — ABNORMAL LOW (ref 12.0–15.0)
MCH: 29.2 pg (ref 26.0–34.0)
MCHC: 31.1 g/dL (ref 30.0–36.0)
MCV: 94 fL (ref 78.0–100.0)
Platelets: 252 10*3/uL (ref 150–400)
RBC: 3.15 MIL/uL — ABNORMAL LOW (ref 3.87–5.11)
RDW: 17.8 % — ABNORMAL HIGH (ref 11.5–15.5)
WBC: 8.3 10*3/uL (ref 4.0–10.5)

## 2013-08-22 MED ORDER — POTASSIUM CHLORIDE 20 MEQ/15ML (10%) PO LIQD
40.0000 meq | Freq: Once | ORAL | Status: AC
  Administered 2013-08-22: 40 meq
  Filled 2013-08-22: qty 30

## 2013-08-22 MED ORDER — LORAZEPAM 2 MG/ML IJ SOLN
INTRAMUSCULAR | Status: AC
  Filled 2013-08-22: qty 1

## 2013-08-22 MED ORDER — LORAZEPAM 2 MG/ML IJ SOLN
0.5000 mg | INTRAMUSCULAR | Status: DC | PRN
  Administered 2013-08-22: 1 mg via INTRAVENOUS
  Administered 2013-08-23: 0.5 mg via INTRAVENOUS
  Administered 2013-08-23: 1 mg via INTRAVENOUS
  Administered 2013-08-23: 0.5 mg via INTRAVENOUS
  Administered 2013-08-24 (×2): 1 mg via INTRAVENOUS
  Filled 2013-08-22 (×6): qty 1

## 2013-08-22 MED ORDER — PANTOPRAZOLE SODIUM 40 MG IV SOLR
40.0000 mg | Freq: Once | INTRAVENOUS | Status: AC
  Administered 2013-08-22: 40 mg via INTRAVENOUS
  Filled 2013-08-22: qty 40

## 2013-08-22 MED ORDER — FENTANYL CITRATE 0.05 MG/ML IJ SOLN
25.0000 ug | INTRAMUSCULAR | Status: DC | PRN
  Administered 2013-08-22: 50 ug via INTRAVENOUS
  Administered 2013-08-22 (×2): 12.5 ug via INTRAVENOUS
  Administered 2013-08-23 (×2): 50 ug via INTRAVENOUS
  Administered 2013-08-23: 25 ug via INTRAVENOUS
  Administered 2013-08-24: 50 ug via INTRAVENOUS
  Filled 2013-08-22 (×6): qty 2

## 2013-08-22 MED ORDER — FUROSEMIDE 10 MG/ML IJ SOLN
40.0000 mg | Freq: Once | INTRAMUSCULAR | Status: AC
  Administered 2013-08-22: 40 mg via INTRAVENOUS
  Filled 2013-08-22: qty 4

## 2013-08-22 NOTE — Progress Notes (Signed)
eLink Physician-Brief Progress Note Patient Name: CHASSIE SECORA DOB: Dec 16, 1929 MRN: [id number]  Date of Service  08/22/2013   HPI/Events of Note   Very anxious despite nurse trying to reassure her she is improving  eICU Interventions  Ativan prn   Intervention Category Minor Interventions: Agitation / anxiety - evaluation and management  Blanche Scovell 08/22/2013, 10:08 PM

## 2013-08-22 NOTE — Progress Notes (Signed)
SLP Cancellation Note  Patient Details Name: [name] MRN: [id number] DOB: Nov 01, 1929   Cancelled treatment:       Reason Eval/Treat Not Completed: Medical issues which prohibited therapy. Patient extubated at 0923 this am following a prolonged, 9 day, intubation. Plan to hold evaluation this day to allow for spontaneous recovery of swallowing function and maximize outcomes. Will f/u for evaluation at bedside 3/13.  [place], CCC-SLP [phone]    Gabriel Rainwater Meryl 08/22/2013, 12:35 PM

## 2013-08-22 NOTE — Progress Notes (Signed)
Assumed care of patient at 24 from [place]. Was told by charge nurse to call attending to advance to clear liquid diet. Called E-Link to get diet order. Diet order received by MD Dr. Lake Bells. Unaware of Speech visit and Progress Note. Patient did not receive diet tray. Order discontinued by MD order. Will confirm patient's condition in the future. Will continue to monitor patient's condition.

## 2013-08-22 NOTE — Progress Notes (Signed)
2 Days Post-Op  Subjective: Pt with no acute changes overnight. Dressing changed yesterday  Objective: Vital signs in last 24 hours: Temp:  [98.5 F (36.9 C)-99.2 F (37.3 C)] 99.2 F (37.3 C) (03/12 0400) Pulse Rate:  [53-84] 60 (03/12 0600) Resp:  [15-24] 15 (03/12 0600) BP: (133-189)/(35-63) 176/35 mmHg (03/12 0600) SpO2:  [96 %-100 %] 98 % (03/12 0600) FiO2 (%):  [40 %] 40 % (03/12 0328) Weight:  [298 lb 15.1 oz (135.6 kg)] 298 lb 15.1 oz (135.6 kg) (03/12 0600) Last BM Date: 08/21/13  Intake/Output from previous day: 03/11 0701 - 03/12 0700 In: 2130 [I.V.:1470; NG/GT:160; IV Piggyback:500] Out: 6025 [Urine:5875; Emesis/NG output:150] Intake/Output this shift:    General appearance: alert and cooperative Incision/Wound: wound packed  Lab Results:   Recent Labs  08/21/13 0500 08/22/13 0500  WBC 8.8 8.3  HGB 9.2* 9.2*  HCT 28.9* 29.6*  PLT 239 252   BMET  Recent Labs  08/21/13 0500 08/22/13 0500  NA 145 145  K 3.5* 3.2*  CL 113* 108  CO2 24 28  GLUCOSE 136* 111*  BUN 25* 23  CREATININE 0.99 1.02  CALCIUM 8.6 8.5   PT/INR No results found for this basename: LABPROT, INR,  in the last 72 hours ABG No results found for this basename: PHART, PCO2, PO2, HCO3,  in the last 72 hours  Studies/Results: Dg Chest Port 1 View  08/22/2013   CLINICAL DATA Evaluate infiltrates.  EXAM PORTABLE CHEST - 1 VIEW  COMPARISON DG CHEST 1V PORT dated 08/21/2013  FINDINGS Endotracheal tube is roughly 2.7 cm above the carina. Nasogastric tube extends into the abdomen. Patchy interstitial densities bilaterally have not changed. Persistent densities at both lung bases are concerning for areas of consolidation and pleural fluid. Heart appears to be upper limits of normal for size. Patient is rotated towards the right on the study. There is a left jugular central venous catheter near the upper SVC but poorly evaluated. Negative for a pneumothorax.  IMPRESSION Diffuse interstitial  densities are suggestive for pulmonary edema. Suspect bilateral pleural effusions with basilar consolidation or atelectasis.  Support apparatuses as described.  SIGNATURE  Electronically Signed   By: Markus Daft M.D.   On: 08/22/2013 06:54   Dg Chest Port 1 View  08/21/2013   CLINICAL DATA Follow-up atelectasis.  EXAM PORTABLE CHEST - 1 VIEW  COMPARISON DG CHEST 1V PORT dated 08/20/2013  FINDINGS The cardiac silhouette appears mild to moderately enlarged, similar. Mediastinal silhouette is nonsuspicious, mildly calcified aortic knob. Similar mild interstitial prominence, with bibasilar airspace opacities and small pleural effusion, similar. Strandy midlung zone densities likely reflect atelectasis. No pneumothorax.  Endotracheal tube tip projects 2.4 cm above the carina. Left internal jugular central venous catheter with distal tip approximating the proximal superior vena cava. Nasogastric tube past the GE junction the distal tip not imaged. Multiple EKG lines overlie the patient and may obscure subtle underlying pathology.  IMPRESSION No apparent change in life-support lines.  Stable cardiomegaly, interstitial prominence likely reflects pulmonary edema with bibasilar airspace opacities, midlung zone atelectasis and small pleural effusions, similar.  SIGNATURE  Electronically Signed   By: Elon Alas   On: 08/21/2013 06:55   Dg Chest Port 1 View  08/20/2013   CLINICAL DATA:  Hypoxia  EXAM: PORTABLE CHEST - 1 VIEW  COMPARISON:  Study obtained earlier in the day  FINDINGS: Endotracheal tube tip is 2.4 cm above the carina. Nasogastric tube in side-port extend below the diaphragm. Central catheter tip  is in the superior vena cava just beyond the junction with the left innominate vein. No pneumothorax.  There is cardiomegaly with bilateral effusions and patchy left and to a lesser extent right mid lung atelectatic change. There is no new opacity.  IMPRESSION: Tube and catheter positions as described without  pneumothorax. Evidence of a degree of underlying congestive heart failure. Patchy mid lung atelectatic change bilaterally, stable.   Electronically Signed   By: Lowella Grip M.D.   On: 08/20/2013 07:58    Anti-infectives: Anti-infectives   Start     Dose/Rate Route Frequency Ordered Stop   08/15/13 1800  vancomycin (VANCOCIN) IVPB 1000 mg/200 mL premix     1,000 mg 200 mL/hr over 60 Minutes Intravenous Every 24 hours 08/15/13 1414     08/14/13 1400  vancomycin (VANCOCIN) 1,500 mg in sodium chloride 0.9 % 500 mL IVPB  Status:  Discontinued     1,500 mg 250 mL/hr over 120 Minutes Intravenous Every 48 hours 08/12/13 1501 08/13/13 1032   08/13/13 1400  vancomycin (VANCOCIN) 1,500 mg in sodium chloride 0.9 % 500 mL IVPB  Status:  Discontinued     1,500 mg 250 mL/hr over 120 Minutes Intravenous Every 24 hours 08/13/13 1032 08/15/13 1411   08/12/13 1800  piperacillin-tazobactam (ZOSYN) IVPB 3.375 g     3.375 g 12.5 mL/hr over 240 Minutes Intravenous Every 8 hours 08/12/13 1737     08/12/13 1730  clindamycin (CLEOCIN) IVPB 600 mg     600 mg 100 mL/hr over 30 Minutes Intravenous 3 times per day 08/12/13 1729     08/12/13 1445  vancomycin (VANCOCIN) 2,000 mg in sodium chloride 0.9 % 500 mL IVPB     2,000 mg 250 mL/hr over 120 Minutes Intravenous STAT 08/12/13 1432 08/13/13 1900      Assessment/Plan: s/p Procedure(s): MINOR APPLICATION OF WOUND VAC (N/A) INCISION AND DRAINAGE ABSCESS (N/A) AS per CCM planning in extubation today Con't dressing changes at [facility] Hopeful that once TFs are held and taking PO diarrhea will cease and help with dressing changes.   LOS: 10 days    Rosario Jacks., [facility] 08/22/2013

## 2013-08-22 NOTE — Procedures (Signed)
Extubation Procedure Note  Patient Details:   Name: [name] DOB: 08-22-1929 MRN: [id number]   Airway Documentation:     Evaluation  O2 sats: 96 Complications: none Patient tolerated procedure well. Bilateral Breath Sounds: Diminished Suctioning: Airway Able to speak  Per CCM order pt extubated.  Placed on 3L nasal cannula.  Pt was stable throughout procedure, no complications.  [name] 08/22/2013, 9:23 AM

## 2013-08-22 NOTE — Progress Notes (Signed)
PULMONARY / CRITICAL CARE MEDICINE  Name: [name] MRN: [id number] DOB: Jun 08, 1930    ADMISSION DATE:  08/12/2013 CONSULTATION DATE:  08/13/2013  REFERRING MD :  TRH  CHIEF COMPLAINT:  Hypotension  BRIEF PATIENT DESCRIPTION:  80 y/o obese (250 lbs) female admitted 3/2 with R labial necrotizing fasciitis s/p debridement complicated by septic shock.  SIGNIFICANT EVENTS: 3/03  OR >>> Labia debridement 3/04  Oversedated with Dilaudid and required Narcan x 2  3/06  OR >>> Further debridement 3/08  Debrided at bedside, apneic with minimal sedation (Fentanyl 100 Versed 2)  3/09  Debridment 3/11  Diarrhea  STUDIES:  3/3  Echo >>> EF 55 to 60%, mild LVH, grade 2 diastolic dysfx  LINES / TUBES: L rad A line  3/3 >>> 3/4 L IJ CVL 3/3 >>> OETT 3/3 >>> 3/12  CULTURES: 3/2  MRSA PCR >>> neg 3/2  Abscess >>> MULTIPLE ORGANISMS 3/2  Blood >>>neg 3/11 C diff >>>neg  ANTIBIOTICS: Vancomycin 3/2 >>> Zosyn 3/2 >>> Clindamycin  3/2 >>>  INTERVAL HISTORY:  RN reports no acute events.  Neg 3.8 L with 3/11 lasix dosing  VITAL SIGNS: Temp:  [98.5 F (36.9 C)-99.2 F (37.3 C)] 99.2 F (37.3 C) (03/12 0400) Pulse Rate:  [53-84] 60 (03/12 0600) Resp:  [15-24] 15 (03/12 0600) BP: (133-189)/(35-63) 176/35 mmHg (03/12 0600) SpO2:  [96 %-100 %] 98 % (03/12 0600) FiO2 (%):  [40 %] 40 % (03/12 0328) Weight:  [298 lb 15.1 oz (135.6 kg)] 298 lb 15.1 oz (135.6 kg) (03/12 0600)  VENTILATOR SETTINGS: Vent Mode:  [-] PRVC FiO2 (%):  [40 %] 40 % Set Rate:  [15 bmp] 15 bmp Vt Set:  [500 mL] 500 mL PEEP:  [5 cmH20] 5 cmH20 Pressure Support:  [12 cmH20] 12 cmH20 Plateau Pressure:  [21 cmH20-29 cmH20] 22 cmH20  INTAKE / OUTPUT: Intake/Output     03/11 0701 - 03/12 0700 03/12 0701 - 03/13 0700   I.V. (mL/kg) 1470 (10.8)    Other     NG/GT 160    IV Piggyback 500    Total Intake(mL/kg) 2130 (15.7)    Urine (mL/kg/hr) 5875 (1.8)    Emesis/NG output 150 (0)    Total Output 6025     Net  -3895           PHYSICAL EXAMINATION: General: no distress Neuro: RASS 0, follows commands, moves extremities HEENT: ETT in place Cardiovascular:  Regular, no murmurs Lungs: even /non-labored, clear bilaterally  Abdomen: soft, non tender Musculoskeletal: 1+ edema Skin: no rashes  LABS:  CBC  Recent Labs Lab 08/20/13 0405 08/21/13 0500 08/22/13 0500  WBC 8.9 8.8 8.3  HGB 9.5* 9.2* 9.2*  HCT 31.0* 28.9* 29.6*  PLT 254 239 252   BMET  Recent Labs Lab 08/20/13 0405 08/21/13 0500 08/22/13 0500  NA 146 145 145  K 3.6* 3.5* 3.2*  CL 112 113* 108  CO2 24 24 28   BUN 26* 25* 23  CREATININE 0.99 0.99 1.02  GLUCOSE 139* 136* 111*   Electrolytes  Recent Labs Lab 08/20/13 0405 08/21/13 0500 08/22/13 0500  CALCIUM 8.6 8.6 8.5    Cardiac Enzymes  Recent Labs Lab 08/17/13 0528  PROBNP 10639.0*   Glucose  Recent Labs Lab 08/20/13 1617 08/20/13 2130 08/21/13 0752 08/21/13 1215 08/21/13 1633 08/21/13 1706  GLUCAP 122* 120* 117* 114* 114* 109*   IMAGING:   Dg Chest Port 1 View  08/22/2013   CLINICAL DATA Evaluate infiltrates.  EXAM PORTABLE  CHEST - 1 VIEW  COMPARISON DG CHEST 1V PORT dated 08/21/2013  FINDINGS Endotracheal tube is roughly 2.7 cm above the carina. Nasogastric tube extends into the abdomen. Patchy interstitial densities bilaterally have not changed. Persistent densities at both lung bases are concerning for areas of consolidation and pleural fluid. Heart appears to be upper limits of normal for size. Patient is rotated towards the right on the study. There is a left jugular central venous catheter near the upper SVC but poorly evaluated. Negative for a pneumothorax.  IMPRESSION Diffuse interstitial densities are suggestive for pulmonary edema. Suspect bilateral pleural effusions with basilar consolidation or atelectasis.  Support apparatuses as described.  SIGNATURE  Electronically Signed   By: Markus Daft M.D.   On: 08/22/2013 06:54   Dg Chest Port  1 View  08/21/2013   CLINICAL DATA Follow-up atelectasis.  EXAM PORTABLE CHEST - 1 VIEW  COMPARISON DG CHEST 1V PORT dated 08/20/2013  FINDINGS The cardiac silhouette appears mild to moderately enlarged, similar. Mediastinal silhouette is nonsuspicious, mildly calcified aortic knob. Similar mild interstitial prominence, with bibasilar airspace opacities and small pleural effusion, similar. Strandy midlung zone densities likely reflect atelectasis. No pneumothorax.  Endotracheal tube tip projects 2.4 cm above the carina. Left internal jugular central venous catheter with distal tip approximating the proximal superior vena cava. Nasogastric tube past the GE junction the distal tip not imaged. Multiple EKG lines overlie the patient and may obscure subtle underlying pathology.  IMPRESSION No apparent change in life-support lines.  Stable cardiomegaly, interstitial prominence likely reflects pulmonary edema with bibasilar airspace opacities, midlung zone atelectasis and small pleural effusions, similar.  SIGNATURE  Electronically Signed   By: Elon Alas   On: 08/21/2013 06:55    ASSESSMENT / PLAN:  PULMONARY A:  Acute respiratory failure in setting of necrotizing fasciitis with septic shock  P:   Extubated 3/12 F/u CXR Prn BD's May need nocturnal cpap post extubation (obesity, apnea with pain meds)  CARDIOVASCULAR A:  Septic shock 2nd to necrotizing fasciitis >> resolved. Hypertension, chronic diastolic CHF, hyperlipidemia. Hypervolemia P:  ASA Prn hydralazine IV for SBP > 170 Hold home lisinopril Repeat lasix dosing on 3/12, 40 mg x1  RENAL A:   AKI in setting of septic shock >> resolved. Hypokalemia. P:   F/u renal fx, urine outpt F/u and replace electrolytes as needed 3/10 - IV fluid to D5 1/2 NS at 50 ml/hr with 20 meq KCL  GASTROINTESTINAL A:  Nutrition. GI Px. Diarrhea  P:   Protonix Per CCS Speech to assess swallowing and then advance diet  HEMATOLOGIC / ONC A:    Anemia of critical illness Hx breast CA. P:  Trend CBC Anastrozole  Lovenox   INFECTIOUS A:   Necrotizing fasciitis Rt labia. Diarrhea develop 3/11 - CDiff neg P:   Day 10 of zosyn, clindamycin, vancomycin >> ? If can narrow abx soon  ENDOCRINE A:   Hyperglycemia. P:   SSI  NEUROLOGIC A:   Sedation. Post-op pain. Deconditioning. P:   Fentanyl PRN PT/OT when okay with CCS  [name], NP-C [facility] Pgr: [phone] or [phone]  Reviewed above, examined pt.  Did well with SBT and extubated.  Will advance diet after evaluation from speech.  Mobilize when okay with CCS.  May be able to narrow Abx soon.  Updated family at bedside.  Chesley Mires, MD [facility] Pulmonary/Critical Care 08/22/2013, 10:05 AM Pager:  [phone] After 3pm call: [phone]

## 2013-08-23 ENCOUNTER — Encounter: Payer: Medicare Other | Admitting: Internal Medicine

## 2013-08-23 ENCOUNTER — Inpatient Hospital Stay (HOSPITAL_COMMUNITY): Payer: Medicare Other

## 2013-08-23 DIAGNOSIS — J209 Acute bronchitis, unspecified: Secondary | ICD-10-CM

## 2013-08-23 LAB — BASIC METABOLIC PANEL
BUN: 16 mg/dL (ref 6–23)
CO2: 30 mEq/L (ref 19–32)
Calcium: 8.8 mg/dL (ref 8.4–10.5)
Chloride: 105 mEq/L (ref 96–112)
Creatinine, Ser: 0.94 mg/dL (ref 0.50–1.10)
GFR calc Af Amer: 64 mL/min — ABNORMAL LOW (ref >90)
GFR calc non Af Amer: 55 mL/min — ABNORMAL LOW (ref >90)
Glucose, Bld: 119 mg/dL — ABNORMAL HIGH (ref 70–99)
Potassium: 3.4 mEq/L — ABNORMAL LOW (ref 3.7–5.3)
Sodium: 145 mEq/L (ref 137–147)

## 2013-08-23 LAB — CBC
HCT: 32 % — ABNORMAL LOW (ref 36.0–46.0)
Hemoglobin: 9.9 g/dL — ABNORMAL LOW (ref 12.0–15.0)
MCH: 29.2 pg (ref 26.0–34.0)
MCHC: 30.9 g/dL (ref 30.0–36.0)
MCV: 94.4 fL (ref 78.0–100.0)
Platelets: 292 10*3/uL (ref 150–400)
RBC: 3.39 MIL/uL — ABNORMAL LOW (ref 3.87–5.11)
RDW: 17.5 % — ABNORMAL HIGH (ref 11.5–15.5)
WBC: 8.9 10*3/uL (ref 4.0–10.5)

## 2013-08-23 LAB — GLUCOSE, CAPILLARY
Glucose-Capillary: 111 mg/dL — ABNORMAL HIGH (ref 70–99)
Glucose-Capillary: 121 mg/dL — ABNORMAL HIGH (ref 70–99)
Glucose-Capillary: 129 mg/dL — ABNORMAL HIGH (ref 70–99)

## 2013-08-23 LAB — MAGNESIUM: Magnesium: 1.6 mg/dL (ref 1.5–2.5)

## 2013-08-23 MED ORDER — FUROSEMIDE 10 MG/ML IJ SOLN
40.0000 mg | Freq: Once | INTRAMUSCULAR | Status: AC
  Administered 2013-08-23: 40 mg via INTRAVENOUS
  Filled 2013-08-23: qty 4

## 2013-08-23 MED ORDER — POTASSIUM CHLORIDE 10 MEQ/50ML IV SOLN
10.0000 meq | INTRAVENOUS | Status: AC
  Administered 2013-08-23 (×4): 10 meq via INTRAVENOUS
  Filled 2013-08-23 (×4): qty 50

## 2013-08-23 MED ORDER — ALBUTEROL SULFATE (2.5 MG/3ML) 0.083% IN NEBU
2.5000 mg | INHALATION_SOLUTION | Freq: Three times a day (TID) | RESPIRATORY_TRACT | Status: DC
  Administered 2013-08-23 – 2013-08-24 (×4): 2.5 mg via RESPIRATORY_TRACT
  Filled 2013-08-23 (×4): qty 3

## 2013-08-23 MED ORDER — PANTOPRAZOLE SODIUM 40 MG IV SOLR
40.0000 mg | INTRAVENOUS | Status: DC
  Administered 2013-08-23 – 2013-08-24 (×2): 40 mg via INTRAVENOUS
  Filled 2013-08-23 (×3): qty 40

## 2013-08-23 MED ORDER — ACETYLCYSTEINE 20 % IN SOLN
4.0000 mL | Freq: Three times a day (TID) | RESPIRATORY_TRACT | Status: AC
  Administered 2013-08-23 – 2013-08-24 (×6): 4 mL via RESPIRATORY_TRACT
  Filled 2013-08-23 (×6): qty 4

## 2013-08-23 NOTE — Progress Notes (Signed)
RT called to bedside due to pt being very tachypneic with RR in 40's.  RT gave pt scheduled breathing treatment then placed on NIV/PS 7/5 and 40%.  Pt tolerating well at this time, RT to monitor and assess as needed.

## 2013-08-23 NOTE — Evaluation (Signed)
Physical Therapy Evaluation Patient Details Name: [name] MRN: [id number] DOB: 1930/03/28 Today's Date: 08/23/2013 Time: [id number] PT Time Calculation (min): 29 min  PT Assessment / Plan / Recommendation History of Present Illness  Admitted 08/12/13 with necrotizing fascitis of perineal area, s/p multiplae I& D's  Clinical Impression  Pt was participatory in mobilizing to the edge of the bed. Did not get to fully upright but sat x 15 minutes propped on L elbow.Pt will benefit from PT to address problems listed.recommend OT consult.    PT Assessment  Patient needs continued PT services    Follow Up Recommendations  SNF    Does the patient have the potential to tolerate intense rehabilitation      Barriers to Discharge        Equipment Recommendations  None recommended by PT    Recommendations for Other Services OT consult   Frequency Min 3X/week    Precautions / Restrictions Precautions Precautions: Fall Precaution Comments: open wound perineum Restrictions Weight Bearing Restrictions: No   Pertinent Vitals/Pain RR lo 30's HR 90's sats 2 l. >93% stated discomfort  Sitting but did not request meds.      Mobility  Bed Mobility Overal bed mobility: Needs Assistance;+2 for physical assistance;+ 2 for safety/equipment Bed Mobility: Sidelying to Sit;Sit to Sidelying Sidelying to sit: Total assist;+2 for physical assistance;+2 for safety/equipment Sit to sidelying: Total assist;+2 for physical assistance;+2 for safety/equipment General bed mobility comments: pt able to move legs minimally to get to edge, HOB elevated to facilitate getting to partial sitting, Propped on  L elbow, never got to a fully upright position.    Exercises     PT Diagnosis: Difficulty walking;Generalized weakness;Acute pain  PT Problem List: Decreased strength;Decreased activity tolerance;Decreased balance;Decreased mobility;Decreased range of motion;Decreased knowledge of use of  DME;Decreased safety awareness;Decreased knowledge of precautions;Decreased skin integrity;Pain PT Treatment Interventions: DME instruction;Gait training;Functional mobility training;Therapeutic activities;Therapeutic exercise;Patient/family education     PT Goals(Current goals can be found in the care plan section) Acute Rehab PT Goals Patient Stated Goal: I will try to sit up PT Goal Formulation: With patient/family Time For Goal Achievement: 09/06/13 Potential to Achieve Goals: Good  Visit Information  Last PT Received On: 08/23/13 Assistance Needed: +3 or more (if attempting standing) History of Present Illness: Admitted 08/12/13 with necrotizing fascitis of perineal area, s/p multiplae I& D's       Prior [place] expects to be discharged to:: Skilled nursing facility Living Arrangements: Spouse/significant other Prior Function Level of Independence: Independent with assistive device(s) Communication Communication: No difficulties    Cognition  Cognition Arousal/Alertness: Awake/alert Behavior During Therapy: WFL for tasks assessed/performed Overall Cognitive Status: Within Functional Limits for tasks assessed    Extremity/Trunk Assessment Upper Extremity Assessment Upper Extremity Assessment: Generalized weakness Lower Extremity Assessment Lower Extremity Assessment: Generalized weakness   Balance Balance Overall balance assessment: Needs assistance Sitting-balance support: Feet unsupported;Bilateral upper extremity supported Sitting balance-Leahy Scale: Poor Sitting balance - Comments: pt sat propped on L elbow x 15 minutes.   End of Session PT - End of Session Activity Tolerance: Patient tolerated treatment well;Patient limited by fatigue Patient left: in bed;with call bell/phone within reach;with family/visitor present Nurse Communication: Mobility status  GP     Claretha Cooper 08/23/2013, 4:02 PM

## 2013-08-23 NOTE — Progress Notes (Signed)
SLP Note  Patient Details Name: [name] MRN: [id number] DOB: [birth year]   BSE completed.  Recommend dys 3 diet with chopped meats, thin liquids, meds whole with liquid. Follow posted precautions.  Full report to folllow.  Deena Shaub B. Quentin Ore [facility], CCC-SLP [id number] [phone] Shonna Chock 08/23/2013, 10:03 AM

## 2013-08-23 NOTE — Progress Notes (Signed)
PULMONARY / CRITICAL CARE MEDICINE  Name: Marie SCULL MRN: [id number] DOB: 1929/10/13    ADMISSION DATE:  08/12/2013 CONSULTATION DATE:  08/13/2013  REFERRING MD :  TRH  CHIEF COMPLAINT:  Hypotension  BRIEF PATIENT DESCRIPTION:  80 y/o obese (250 lbs) female admitted 3/2 with R labial necrotizing fasciitis s/p debridement complicated by septic shock.  SIGNIFICANT EVENTS: 3/03  OR >>> Labia debridement 3/04  Oversedated with Dilaudid and required Narcan x 2  3/06  OR >>> Further debridement 3/08  Debrided at bedside, apneic with minimal sedation (Fentanyl 100 Versed 2)  3/09  Debridment 3/11  Diarrhea  STUDIES:  3/3  Echo >>> EF 55 to 60%, mild LVH, grade 2 diastolic dysfx  LINES / TUBES: L rad A line  3/3 >>> 3/4 L IJ CVL 3/3 >>> OETT 3/3 >>> 3/12  CULTURES: 3/2  MRSA PCR >>> neg 3/2  Abscess >>> MULTIPLE ORGANISMS 3/2  Blood >>>neg 3/11 C diff >>>neg  ANTIBIOTICS: Vancomycin 3/2 >>>3/13 Zosyn 3/2 >>> Clindamycin  3/2 >>>  INTERVAL HISTORY:   Feels anxious.  Has cough with chest congestion.  Feels hungry.  VITAL SIGNS: Temp:  [97.9 F (36.6 C)-98.9 F (37.2 C)] 98.9 F (37.2 C) (03/13 0800) Pulse Rate:  [68-94] 81 (03/13 0800) Resp:  [22-36] 31 (03/13 0800) BP: (152-183)/(39-76) 173/73 mmHg (03/13 0800) SpO2:  [91 %-99 %] 98 % (03/13 0800) FiO2 (%):  [4 %] 4 % (03/12 1000) Weight:  [278 lb 10.6 oz (126.4 kg)] 278 lb 10.6 oz (126.4 kg) (03/13 0400)  INTAKE / OUTPUT: Intake/Output     03/12 0701 - 03/13 0700 03/13 0701 - 03/14 0700   P.O. 240    I.V. (mL/kg) 1380 (10.9) 320 (2.5)   NG/GT 35.7    IV Piggyback 550 50   Total Intake(mL/kg) 2205.7 (17.5) 370 (2.9)   Urine (mL/kg/hr) 6135 (2) 1000 (4)   Emesis/NG output     Total Output 6135 1000   Net -3929.3 -630         PHYSICAL EXAMINATION: General: mild increased WOB Neuro: follows commands, moves extremities HEENT: Lt IJ site clean Cardiovascular:  Regular, no murmurs Lungs: decreased BS Rt  base Abdomen: soft, non tender Musculoskeletal: 1+ edema Skin: no rashes  LABS:  CBC  Recent Labs Lab 08/21/13 0500 08/22/13 0500 08/23/13 0453  WBC 8.8 8.3 8.9  HGB 9.2* 9.2* 9.9*  HCT 28.9* 29.6* 32.0*  PLT 239 252 292   BMET  Recent Labs Lab 08/21/13 0500 08/22/13 0500 08/23/13 0453  NA 145 145 145  K 3.5* 3.2* 3.4*  CL 113* 108 105  CO2 24 28 30   BUN 25* 23 16  CREATININE 0.99 1.02 0.94  GLUCOSE 136* 111* 119*   Electrolytes  Recent Labs Lab 08/21/13 0500 08/22/13 0500 08/23/13 0453  CALCIUM 8.6 8.5 8.8  MG  --   --  1.6    Cardiac Enzymes  Recent Labs Lab 08/17/13 0528  PROBNP 10639.0*   Glucose  Recent Labs Lab 08/21/13 2108 08/22/13 0817 08/22/13 1158 08/22/13 1643 08/22/13 2101 08/23/13 0833  GLUCAP 128* 103* 124* 106* 108* 111*   IMAGING:   Dg Chest Port 1 View  08/23/2013   CLINICAL DATA:  Extubation.  EXAM: PORTABLE CHEST - 1 VIEW  COMPARISON:  DG CHEST 1V PORT dated 08/22/2013; DG CHEST 1V PORT dated 08/21/2013  FINDINGS: Interim extubation. Left IJ line in stable position. Interim removal of NG tube. Progressive right lower lobe atelectasis is present. Cardiomegaly with  pulmonary vascular prominence and interstitial prominence with bilateral pleural effusions. Findings consistent congestive heart failure. No pneumothorax.  IMPRESSION: 1. Interim extubation and removal of NG tube. Left IJ line in stable position. 2. Severe right lower lobe atelectasis and consolidation. 3. Congestive heart failure with pulmonary interstitial edema and bilateral pleural effusions.   Electronically Signed   By: [place]   On: 08/23/2013 08:00   Dg Chest Port 1 View  08/22/2013   CLINICAL DATA Evaluate infiltrates.  EXAM PORTABLE CHEST - 1 VIEW  COMPARISON DG CHEST 1V PORT dated 08/21/2013  FINDINGS Endotracheal tube is roughly 2.7 cm above the carina. Nasogastric tube extends into the abdomen. Patchy interstitial densities bilaterally have not  changed. Persistent densities at both lung bases are concerning for areas of consolidation and pleural fluid. Heart appears to be upper limits of normal for size. Patient is rotated towards the right on the study. There is a left jugular central venous catheter near the upper SVC but poorly evaluated. Negative for a pneumothorax.  IMPRESSION Diffuse interstitial densities are suggestive for pulmonary edema. Suspect bilateral pleural effusions with basilar consolidation or atelectasis.  Support apparatuses as described.  SIGNATURE  Electronically Signed   By: Markus Daft M.D.   On: 08/22/2013 06:54    ASSESSMENT / PLAN:  PULMONARY A:  Acute respiratory failure in setting of necrotizing fasciitis with septic shock >> extubated 3/12. Atelectasis Rt > Lt on CXR 3/13. P:   F/u CXR Add scheduled BD's, mucomyst 3/13 PRN BiPAP Oxygen to keep SpO2 > 92% IS, flutter valve  CARDIOVASCULAR A:  Septic shock 2nd to necrotizing fasciitis >> resolved. Hypertension, chronic diastolic CHF, hyperlipidemia. Hypervolemia. P:  ASA Prn hydralazine IV for SBP > 170 Hold home lisinopril Repeat lasix dosing on 3/13, 40 mg x1  RENAL A:   AKI in setting of septic shock >> resolved. Hypokalemia. P:   F/u renal fx, urine outpt F/u and replace electrolytes as needed 3/10 - IV fluid to D5 1/2 NS at 50 ml/hr with 20 meq KCL  GASTROINTESTINAL A:  Nutrition. GI Px. Diarrhea. P:   Protonix Per CCS Speech to assess swallowing and then advance diet  HEMATOLOGIC / ONC A:   Anemia of critical illness Hx breast CA. P:  Trend CBC Anastrozole  Lovenox   INFECTIOUS A:   Necrotizing fasciitis Rt labia. Diarrhea develop 3/11 - CDiff neg P:   Day 11/14 of zosyn, clindamycin D/c vancomycin 3/13  ENDOCRINE A:   Hyperglycemia. P:   SSI  NEUROLOGIC A:   Post-op pain. Deconditioning. P:   Fentanyl PRN PT/OT when okay with CCS   Chesley Mires, MD [place] 08/23/2013,  9:00 AM Pager:  [phone] After 3pm call: [phone]

## 2013-08-23 NOTE — Progress Notes (Signed)
CARE MANAGEMENT NOTE 08/23/2013  Patient:  [name],[name]   Account Number:  [id number]  Date Initiated:  08/15/2013  Documentation initiated by:  [name]  Subjective/Objective Assessment:   patient transferred from med surg floor on [id number] after i and d for ncrotizing facsitis of the labia and requiring intubation.     Action/Plan:   tbd based on progress of resp and surg conditions.   Anticipated DC Date:  08/26/2013   Anticipated DC Plan:  [place]  In-house referral  NA      DC Planning Services  NA      [facility] Choice  NA   Choice offered to / List presented to:  NA   DME arranged  NA      DME agency  NA     [place] arranged  NA      [place] agency  NA   Status of service:  In process, will continue to follow Medicare Important Message given?  NA - LOS <3 / Initial given by admissions (If response is "NO", the following Medicare IM given date fields will be blank) Date Medicare IM given:   Date Additional Medicare IM given:    Discharge Disposition:    Per UR Regulation:  Reviewed for med. necessity/level of care/duration of stay  If discussed at [place] of Stay Meetings, dates discussed:   08/20/2013  08/22/2013    Comments:  [id number] Rosana Hoes, RN, BSN, [place], [phone] Chart reviewed for update of needs and condition. Patient successfully extubated on [id number]/p.o. diet started, t.feeding stopped/ pt will be on bipap at night due to desats.  Daily dressing changes continue to right labial area.  [place] slowed with dc of tube feeding. icu day [place], RN, BSN, [place] [phone] Chart Reviewed for discharge and hospital needs. Discharge needs at time of review:  None present will follow for needs. Review of patient progress due on [id number]. patient continues to require full vent support and returning trips to O.R. due to right labia necrotizing fascitis.   [id number] Rosana Hoes, RN, BSN,  [place] [phone] Chart Reviewed for discharge and hospital needs. Discharge needs at time of review:  None present will follow for needs. Review of patient progress due on [id number]. pod 1/ vent day 1/ icu day 1

## 2013-08-23 NOTE — Progress Notes (Signed)
NUTRITION FOLLOW UP  Intervention:   - Diet advancement per SLP/MD - Will continue to monitor   Nutrition Dx:   Inadequate oral intake related to inability to eat as evidenced by NPO, mechanical ventilation - ongoing but now only as evidenced by NPO.    Goal:   Enteral nutrition to provide 60-70% of estimated calorie needs (22-25 kcals/kg ideal body weight) and 100% of estimated protein needs, based on ASPEN guidelines for permissive underfeeding in critically ill obese individuals - not met, no longer on TF  New goal: Advance diet per SLP   Monitor:   Weights, labs, diet advancement  Assessment:   Admitted with abscess on buttocks x 3 days, found to have necrotizing fascitis of the perineum. Pt with history of paroxysmal atrial flutter, T2DM, morbid obesity, CVA, GERD, post-operative DVT, CKD, breast CA, anxiety, and anemia. Had incision and drainage of abscess 08/12/13.   3/3 - Met with pt and family who report pt has been hardly eating anything for the past 3 days. States before then she was eating 2 meals/day and some snacks and reports her meals were well balanced. Denies any nausea or diarrhea today, had both yesterday. Denies any changes in weight.   3/4 - Noted events of earlier this morning with decrease in oxygen saturations and pt becoming unresponsive. Intubated today. Plans for additional surgery today. Obtained verbal order from NP to manage TF. Pt with mittens on.   3/9 - Had additional surgical debridement 3/4. Had frequent loose stools 3/4 and flexiseal was placed. Had repeat surgical debridement of perineum wound 3/6. Had debridement at bedside 3/8. Per conversation with RN, pt tolerating TF well before it was turned off this morning at 7:30am for surgery later on today. Remains intubated. Pt's weight up 41 pounds since admission. Pt with 134m rectal tube output yesterday, same as the day before.   3/13 - Had debridement and irrigation of perineum wound 3/9, had further  debridement and irrigation of abscess 35/73with application of wound VAC. TF held 3/11 as pt was stooling through the wound VAC preventing it from being sealed. Extubated 3/12. Diarrhea improving since TF and Prostat d/c 3/12. Awaiting SLP evaluation.   Potassium low, getting replacement in IVF and IV and oral replacement   Height: Ht Readings from Last 1 Encounters:  08/14/13 5' 2"  (1.575 m)    Weight Status:   Wt Readings from Last 1 Encounters:  08/23/13 278 lb 10.6 oz (126.4 kg)  Admit wt:        250 lb (113.3 kg) Net I/Os: +17.5L  Re-estimated needs:  Kcal: 1500-1650 Protein: 125g Fluid: >1.6L/day  Skin: +2 generalized edema, RUE, LUE edema, +3 RLE, LLE edema, perineum incision, right buttocks incision    Diet Order:  NPO   Intake/Output Summary (Last 24 hours) at 08/23/13 0933 Last data filed at 08/23/13 0900  Gross per 24 hour  Intake 2523.17 ml  Output   7135 ml  Net -4611.83 ml    Last BM: 3/12   Labs:   Recent Labs Lab 08/21/13 0500 08/22/13 0500 08/23/13 0453  NA 145 145 145  K 3.5* 3.2* 3.4*  CL 113* 108 105  CO2 24 28 30   BUN 25* 23 16  CREATININE 0.99 1.02 0.94  CALCIUM 8.6 8.5 8.8  MG  --   --  1.6  GLUCOSE 136* 111* 119*    CBG (last 3)   Recent Labs  08/22/13 1643 08/22/13 2101 08/23/13 0833  GLUCAP 106* 108*  111*    Scheduled Meds: . acetylcysteine  4 mL Nebulization 3 times per day  . albuterol  2.5 mg Nebulization Q8H  . anastrozole  1 mg Oral q morning - 10a  . antiseptic oral rinse  15 mL Mouth Rinse QID  . aspirin EC  243 mg Oral QODAY  . aspirin EC  325 mg Oral QODAY  . chlorhexidine  15 mL Mouth Rinse BID  . cholecalciferol  1,000 Units Oral q morning - 10a  . clindamycin (CLEOCIN) IV  600 mg Intravenous 3 times per day  . enoxaparin (LOVENOX) injection  60 mg Subcutaneous Q24H  . insulin aspart  0-9 Units Subcutaneous TID WC  . pantoprazole (PROTONIX) IV  40 mg Intravenous Q24H  . piperacillin-tazobactam (ZOSYN)   IV  3.375 g Intravenous Q8H  . potassium chloride  10 mEq Intravenous Q1 Hr x 4    Continuous Infusions: . sodium chloride 20 mL/hr at 08/23/13 0900  . dextrose 5 % and 0.45 % NaCl with KCl 20 mEq/L 50 mL/hr at 08/23/13 0900    Mikey College MS, RD, [place] Pager [phone] After Hours Pager

## 2013-08-23 NOTE — Progress Notes (Signed)
3 Days Post-Op  Subjective: Pt extubated yesterday Con't with dressing changes Less diarrhea now that TF off  Objective: Vital signs in last 24 hours: Temp:  [97.9 F (36.6 C)-98.9 F (37.2 C)] 98.7 F (37.1 C) (03/13 0400) Pulse Rate:  [50-94] 71 (03/13 0600) Resp:  [15-36] 22 (03/13 0600) BP: (152-183)/(39-76) 155/56 mmHg (03/13 0600) SpO2:  [91 %-99 %] 98 % (03/13 0600) FiO2 (%):  [4 %-40 %] 4 % (03/12 1000) Weight:  [278 lb 10.6 oz (126.4 kg)] 278 lb 10.6 oz (126.4 kg) (03/13 0400) Last BM Date: 08/22/13  Intake/Output from previous day: 03/12 0701 - 03/13 0700 In: 2205.7 [P.O.:240; I.V.:1380; NG/GT:35.7; IV Piggyback:550] Out: [id number] [Urine:6135] Intake/Output this shift:    General appearance: alert and cooperative Incision/Wound: Wound packed  Lab Results:   Recent Labs  08/22/13 0500 08/23/13 0453  WBC 8.3 8.9  HGB 9.2* 9.9*  HCT 29.6* 32.0*  PLT 252 292   BMET  Recent Labs  08/22/13 0500 08/23/13 0453  NA 145 145  K 3.2* 3.4*  CL 108 105  CO2 28 30  GLUCOSE 111* 119*  BUN 23 16  CREATININE 1.02 0.94  CALCIUM 8.5 8.8   PT/INR No results found for this basename: LABPROT, INR,  in the last 72 hours ABG No results found for this basename: PHART, PCO2, PO2, HCO3,  in the last 72 hours  Studies/Results: Dg Chest Port 1 View  08/22/2013   CLINICAL DATA Evaluate infiltrates.  EXAM PORTABLE CHEST - 1 VIEW  COMPARISON DG CHEST 1V PORT dated 08/21/2013  FINDINGS Endotracheal tube is roughly 2.7 cm above the carina. Nasogastric tube extends into the abdomen. Patchy interstitial densities bilaterally have not changed. Persistent densities at both lung bases are concerning for areas of consolidation and pleural fluid. Heart appears to be upper limits of normal for size. Patient is rotated towards the right on the study. There is a left jugular central venous catheter near the upper SVC but poorly evaluated. Negative for a pneumothorax.  IMPRESSION Diffuse  interstitial densities are suggestive for pulmonary edema. Suspect bilateral pleural effusions with basilar consolidation or atelectasis.  Support apparatuses as described.  SIGNATURE  Electronically Signed   By: Markus Daft M.D.   On: 08/22/2013 06:54    Anti-infectives: Anti-infectives   Start     Dose/Rate Route Frequency Ordered Stop   08/15/13 1800  vancomycin (VANCOCIN) IVPB 1000 mg/200 mL premix     1,000 mg 200 mL/hr over 60 Minutes Intravenous Every 24 hours 08/15/13 1414     08/14/13 1400  vancomycin (VANCOCIN) 1,500 mg in sodium chloride 0.9 % 500 mL IVPB  Status:  Discontinued     1,500 mg 250 mL/hr over 120 Minutes Intravenous Every 48 hours 08/12/13 1501 08/13/13 1032   08/13/13 1400  vancomycin (VANCOCIN) 1,500 mg in sodium chloride 0.9 % 500 mL IVPB  Status:  Discontinued     1,500 mg 250 mL/hr over 120 Minutes Intravenous Every 24 hours 08/13/13 1032 08/15/13 1411   08/12/13 1800  piperacillin-tazobactam (ZOSYN) IVPB 3.375 g     3.375 g 12.5 mL/hr over 240 Minutes Intravenous Every 8 hours 08/12/13 1737     08/12/13 1730  clindamycin (CLEOCIN) IVPB 600 mg     600 mg 100 mL/hr over 30 Minutes Intravenous 3 times per day 08/12/13 1729     08/12/13 1445  vancomycin (VANCOCIN) 2,000 mg in sodium chloride 0.9 % 500 mL IVPB     2,000 mg 250 mL/hr over  120 Minutes Intravenous STAT 08/12/13 1432 08/13/13 1900      Assessment/Plan: s/p Procedure(s): MINOR APPLICATION OF WOUND VAC (N/A) INCISION AND DRAINAGE ABSCESS (N/A) Advance diet as tol Con't BID dressing changes Con't abx  LOS: 11 days    Rosario Jacks., Anne Hahn 08/23/2013

## 2013-08-23 NOTE — Evaluation (Signed)
Clinical/Bedside Swallow Evaluation Patient Details  Name: DELAYLAH HOLLING MRN: [id number] Date of Birth: November 28, 1929  Today's Date: 08/23/2013 Time: 0930-1000 SLP Time Calculation (min): 30 min  Past Medical History:  Past Medical History  Diagnosis Date  . Hypertension   . Hyperlipidemia   . Morbid obesity   . Osteoarthritis   . Gout   . CVA (cerebral infarction)   . IBS (irritable bowel syndrome)   . Diverticulosis   . Esophageal stricture   . Colon polyps     hyperplastic  . Anxiety   . Hiatal hernia     4 cm  . Shingles 02/21/2011  . Skin cancer   . Breast cancer     right breast  . Dysrhythmia     dr bensimhon   . Blood transfusion   . Chronic kidney disease     occ uti's  . GERD (gastroesophageal reflux disease)   . PONV (postoperative nausea and vomiting)   . DVT of leg (deep venous thrombosis)     left; S/P OR  . Atrial flutter     "sometimes"  . Bronchitis   . Pneumonia     "walking"  . Shortness of breath on exertion   . Anemia   . Stroke 2002    residual:  "little tingling in palm of left hand"  . Kidney stones 1990's  . UTI (lower urinary tract infection)     "I've had a few"  . History of radiation therapy 02/15/12-04/02/12    right breast 4680 cGy/26 sessions, right boost=1400cGy /7 sessions   Past Surgical History:  Past Surgical History  Procedure Laterality Date  . Abdominal hysterectomy    . Bile duct stent placement  ~ 01/2011  . Pelvic bone tumor removal      twice in 2000's  . Colonic perforation repair  ~ 2000  . Total hip arthroplasty  1980's    right  . Joint replacement    . Colon surgery      hole in intestine ,tumors?  . Tonsillectomy  ~ 1946  . Cholecystectomy  03/23/11    lap chole   . Replacement total knee bilateral  ~ 2008; ~ 2010    right; left  . Cataract extraction w/ intraocular lens  implant, bilateral  1990's  . Hemorrhoid surgery  2012  . Breast lumpectomy  06/29/11    right  . Breast lumpectomy  06/29/2011   Procedure: BREAST LUMPECTOMY WITH EXCISION OF SENTINEL NODE;  Surgeon: Pedro Earls, MD;  Location: [facility];  Service: General;  Laterality: Right;  right sentinel node mapping,right sentinel node biopsy, needle localization right breast lumpectomy  . Incision and drainage breast abscess      right breast seroma  . Incision and drainage abscess N/A 08/12/2013    Procedure: INCISION AND DRAINAGE ABSCESS;  Surgeon: Earnstine Regal, MD;  Location: [facility];  Service: General;  Laterality: N/A;  . Irrigation and debridement abscess N/A 08/14/2013    Procedure: Debridment of perineal tissue and debridement of perineal wound;  Surgeon: Earnstine Regal, MD;  Location: [facility];  Service: General;  Laterality: N/A;  . Irrigation and debridement abscess N/A 08/16/2013    Procedure: DRESSING CHANGE AND DEBRIDEMENT OF PERINEUM WOUND;  Surgeon: Earnstine Regal, MD;  Location: [facility];  Service: General;  Laterality: N/A;  . Pilonidal cyst drainage N/A 08/19/2013    Procedure: Dressing change perineum;  Surgeon: Ralene Ok, MD;  Location: [facility];  Service: General;  Laterality: N/A;  . Minor application of wound vac N/A 08/20/2013    Procedure: MINOR APPLICATION OF WOUND VAC;  Surgeon: Ralene Ok, MD;  Location: [facility];  Service: General;  Laterality: N/A;  . Incision and drainage abscess N/A 08/20/2013    Procedure: INCISION AND DRAINAGE ABSCESS;  Surgeon: Ralene Ok, MD;  Location: [facility];  Service: General;  Laterality: N/A;   HPI:  80 year old female admitted 08/12/13 due to perineal pain, with subsequent acute respiratory failure and septic shock associated with necrotizing faciitis of the perineum.  PMH significant for AFlutter, DM2, obesity, DOE, CVA, GERD, DVT, CKD, right breast CA, anxiety, anemia, hiatal hernia.  BSE ordered to evaluate swallow function and safety s/p lengthy intubation.   Assessment / Plan / Recommendation Clinical Impression  Pt reported oral care had been done recently.  Oral motor  strength and coordination appear adequate.  Pt voice quality is clear and strong, despite 9 days of intubation.  No overt s/s aspiration observed with any consistency tested.  No prior history of dysphagia reported by pt.  Will begin with modified diet of mech soft with chopped meats (for energy conservation) and thin liquids, monitoring closely for tolerance given intubation history, past CVA, and GERD.Marland Kitchen  Precautions posted at [facility]. RN, pt, and husband aware of results and recommendations.    Aspiration Risk  Moderate    Diet Recommendation Dysphagia 3 (Mechanical Soft);Thin liquid   Liquid Administration via: Straw;Cup Medication Administration: Whole meds with liquid Supervision: Patient able to self feed;Staff to assist with self feeding Compensations: Slow rate;Small sips/bites (Begin meals with warm beverage) Postural Changes and/or Swallow Maneuvers: Seated upright 90 degrees;Upright 30-60 min after meal    Other  Recommendations Oral Care Recommendations: Oral care BID   Follow Up Recommendations  None    Frequency and Duration min 1 x/week  1 week   Pertinent Vitals/Pain VSS, no pain reported    SLP Swallow Goals  see care plan   Swallow Study Prior Functional Status   Regular diet/thin liquids prior to admit. No reported history of dysphagia.    General Date of Onset: 08/12/13 HPI: 80 year old female admitted 08/12/13 due to perineal pain, with subsequent acute respiratory failure and septic shock associated with necrotizing faciitis of the perineum.  PMH significant for AFlutter, DM2, obesity, DOE, CVA, GERD, DVT, CKD, right breast CA, anxiety, anemia, hiatal hernia.  BSE ordered to evaluate swallow function and safety s/p lengthy intubation. Type of Study: Bedside swallow evaluation Previous Swallow Assessment: n/a Diet Prior to this Study: NPO Temperature Spikes Noted: No Respiratory Status: Nasal cannula History of Recent Intubation: Yes Length of Intubations (days):  9 days Date extubated: 08/22/13 Behavior/Cognition: Alert;Cooperative;Pleasant mood Oral Cavity - Dentition: Dentures, top;Dentures, bottom Self-Feeding Abilities: Able to feed self Patient Positioning: Upright in bed Baseline Vocal Quality: Clear Volitional Cough: Weak Volitional Swallow: Able to elicit    Oral/Motor/Sensory Function Overall Oral Motor/Sensory Function: Appears within functional limits for tasks assessed   Ice Chips Ice chips: Within functional limits Presentation: Spoon   Thin Liquid Thin Liquid: Within functional limits Presentation: Straw    Nectar Thick Nectar Thick Liquid: Not tested   Honey Thick Honey Thick Liquid: Not tested   Puree Puree: Within functional limits Presentation: Spoon;Self Fed   Solid   GO    Solid: Within functional limits Presentation: [place] [facility], [place] [phone]  Shonna Chock 08/23/2013,10:56 AM

## 2013-08-23 NOTE — Progress Notes (Signed)
RN called to inform RT Pt has taken BIPAP mask off and has refused for it to be placed back on.  Pt on 4 LPM Haakon and tolerating well at this time.  RT to monitor and assess as needed.

## 2013-08-23 NOTE — Progress Notes (Signed)
At about [id number], Pt removed BIPAP from face stating to RN " It's hurting my face, I don't want it anymore" this RN tried to re-adjust the straps on the face mask but pt declined pulling it off her face.Pt educated on the need for BIPAp due to her respiratory status. BIPAP was taken off pt, pt place on 4L oxygen via Nasal canula. Pt currently in bed, respirations in the high 30s,other vital signs stables, denied pain.

## 2013-08-24 ENCOUNTER — Inpatient Hospital Stay (HOSPITAL_COMMUNITY): Payer: Medicare Other

## 2013-08-24 DIAGNOSIS — E119 Type 2 diabetes mellitus without complications: Secondary | ICD-10-CM

## 2013-08-24 LAB — CBC
HCT: 29.5 % — ABNORMAL LOW (ref 36.0–46.0)
Hemoglobin: 9.1 g/dL — ABNORMAL LOW (ref 12.0–15.0)
MCH: 29.3 pg (ref 26.0–34.0)
MCHC: 30.8 g/dL (ref 30.0–36.0)
MCV: 94.9 fL (ref 78.0–100.0)
Platelets: 302 10*3/uL (ref 150–400)
RBC: 3.11 MIL/uL — ABNORMAL LOW (ref 3.87–5.11)
RDW: 17.8 % — ABNORMAL HIGH (ref 11.5–15.5)
WBC: 8.1 10*3/uL (ref 4.0–10.5)

## 2013-08-24 LAB — BASIC METABOLIC PANEL
BUN: 14 mg/dL (ref 6–23)
CO2: 31 mEq/L (ref 19–32)
Calcium: 8.6 mg/dL (ref 8.4–10.5)
Chloride: 106 mEq/L (ref 96–112)
Creatinine, Ser: 0.99 mg/dL (ref 0.50–1.10)
GFR calc Af Amer: 60 mL/min — ABNORMAL LOW (ref >90)
GFR calc non Af Amer: 52 mL/min — ABNORMAL LOW (ref >90)
Glucose, Bld: 129 mg/dL — ABNORMAL HIGH (ref 70–99)
Potassium: 3.6 mEq/L — ABNORMAL LOW (ref 3.7–5.3)
Sodium: 145 mEq/L (ref 137–147)

## 2013-08-24 LAB — GLUCOSE, CAPILLARY
Glucose-Capillary: 113 mg/dL — ABNORMAL HIGH (ref 70–99)
Glucose-Capillary: 124 mg/dL — ABNORMAL HIGH (ref 70–99)
Glucose-Capillary: 115 mg/dL — ABNORMAL HIGH (ref 70–99)
Glucose-Capillary: 114 mg/dL — ABNORMAL HIGH (ref 70–99)

## 2013-08-24 MED ORDER — POTASSIUM CHLORIDE 2 MEQ/ML IV SOLN
INTRAVENOUS | Status: DC
  Administered 2013-08-24 – 2013-08-27 (×3): via INTRAVENOUS
  Filled 2013-08-24 (×7): qty 1000

## 2013-08-24 MED ORDER — POTASSIUM CHLORIDE 10 MEQ/50ML IV SOLN
10.0000 meq | INTRAVENOUS | Status: AC
Start: 2013-08-24 — End: 2013-08-24
  Administered 2013-08-24 (×4): 10 meq via INTRAVENOUS
  Filled 2013-08-24 (×4): qty 50

## 2013-08-24 MED ORDER — ALBUTEROL SULFATE (2.5 MG/3ML) 0.083% IN NEBU: 2.5000 mg | INHALATION_SOLUTION | RESPIRATORY_TRACT | Status: DC | PRN

## 2013-08-24 MED ORDER — FUROSEMIDE 10 MG/ML IJ SOLN: 40.0000 mg | Freq: Once | INTRAMUSCULAR | Status: AC

## 2013-08-24 MED ORDER — ALBUTEROL SULFATE (2.5 MG/3ML) 0.083% IN NEBU
2.5000 mg | INHALATION_SOLUTION | Freq: Four times a day (QID) | RESPIRATORY_TRACT | Status: DC
  Administered 2013-08-24 – 2013-08-29 (×20): 2.5 mg via RESPIRATORY_TRACT
  Filled 2013-08-24 (×22): qty 3

## 2013-08-24 MED ORDER — FUROSEMIDE 10 MG/ML IJ SOLN
INTRAMUSCULAR | Status: AC
  Administered 2013-08-24: 40 mg
  Filled 2013-08-24: qty 4

## 2013-08-24 MED ORDER — FENTANYL CITRATE 0.05 MG/ML IJ SOLN
25.0000 ug | INTRAMUSCULAR | Status: DC | PRN
  Administered 2013-08-24 – 2013-08-25 (×4): 25 ug via INTRAVENOUS
  Filled 2013-08-24 (×4): qty 2

## 2013-08-24 NOTE — Progress Notes (Signed)
Unable to do dressing change at this time due to tenuous respiratory status. Patient unable to tolerate being turned and flat for long period of time. Consulted with charge nurse and respiratory therapy. Dressings appears clean and dry at this time.

## 2013-08-24 NOTE — Progress Notes (Signed)
Physical Therapy Treatment Patient Details Name: [name] MRN: [id number] DOB: 1929/09/25 Today's Date: 08/24/2013 Time: 1010-1025 PT Time Calculation (min): 15 min  PT Assessment / Plan / Recommendation  History of Present Illness Admitted 08/12/13 with necrotizing fascitis of perineal area, s/p multiplae I& D's   PT Comments   Pt in bed on 2 lts nasal with spouse at bed side.  Performed B LE TE's AAROM and repositioning to comfort.  Pt in/out drowzy sleepy.  RR and BP increased with activity so did not attempt EOB activity.   Follow Up Recommendations  SNF     Does the patient have the potential to tolerate intense rehabilitation     Barriers to Discharge        Equipment Recommendations  None recommended by PT    Recommendations for Other Services    Frequency Min 3X/week   Progress towards PT Goals Progress towards PT goals: Progressing toward goals  Plan      Precautions / Restrictions Precautions Precautions: Fall Precaution Comments: open wound perineum Restrictions Weight Bearing Restrictions: No   Pertinent Vitals/Pain BP 164/61, RR 44 at highest    Mobility    Tx session focused on B LE TE's   Exercises  15 reps B AP 15 reps B knee presses 10 reps B HS AAOM R>L 10 reps SAQ's 10 reps B ABD    PT Goals (current goals can now be found in the care plan section)    Visit Information  Last PT Received On: 08/24/13 Assistance Needed: +3 or more History of Present Illness: Admitted 08/12/13 with necrotizing fascitis of perineal area, s/p multiplae I& D's    Subjective Data      Cognition       Balance     End of Session PT - End of Session Activity Tolerance: Patient limited by fatigue Patient left: in bed;with call bell/phone within reach;with family/visitor present   Rica Koyanagi  PTA [facility]  Acute  Rehab Pager      [phone]

## 2013-08-24 NOTE — Progress Notes (Signed)
Patient ID: [name], female   DOB: 05/08/1930, 80 y.o.   MRN: [id number]  General Surgery - [facility] - Progress Note  POD# multiple procedures  Subjective: Patient awake and responsive.  Mild dyspnea.  No complaints.  Eating very little.  Objective: Vital signs in last 24 hours: Temp:  [97.7 F (36.5 C)-98.7 F (37.1 C)] 98.7 F (37.1 C) (03/14 0800) Pulse Rate:  [65-97] 83 (03/14 0800) Resp:  [18-36] 21 (03/14 0800) BP: (90-197)/(24-87) 151/57 mmHg (03/14 0800) SpO2:  [91 %-99 %] 98 % (03/14 0800) FiO2 (%):  [40 %] 40 % (03/14 0628) Weight:  [279 lb 5.2 oz (126.7 kg)] 279 lb 5.2 oz (126.7 kg) (03/14 0352) Last BM Date: 08/23/13  Intake/Output from previous day: 03/13 0701 - 03/14 0700 In: 2700 [P.O.:480; I.V.:1770; IV Piggyback:450] Out: 4870 [Urine:4870]  Exam: HEENT - clear, not icteric Neck - soft Chest - coarse, shallow bilaterally Cor - RRR, no murmur Abd - soft, non-tender GU - dressings in place; less erythema and induration Neuro - grossly intact, no focal deficits  Lab Results:   Recent Labs  08/23/13 0453 08/24/13 0545  WBC 8.9 8.1  HGB 9.9* 9.1*  HCT 32.0* 29.5*  PLT 292 302     Recent Labs  08/23/13 0453 08/24/13 0545  NA 145 145  K 3.4* 3.6*  CL 105 106  CO2 30 31  GLUCOSE 119* 129*  BUN 16 14  CREATININE 0.94 0.99  CALCIUM 8.8 8.6    Studies/Results: Dg Chest Port 1 View  08/24/2013   CLINICAL DATA:  Followup atelectasis  EXAM: PORTABLE CHEST - 1 VIEW  COMPARISON:  08/23/2013  FINDINGS: Progression of right lower lobe airspace consolidation. There is a moderate right effusion.  Left lower lobe consolidation and small left effusion are similar.  There is vascular congestion with probable mild edema  Left jugular catheter tip in the SVC.  IMPRESSION: Findings consistent with congestive heart failure with edema and bilateral effusions.  Progression of right lower lobe consolidation which may be atelectasis or  infiltrate.   Electronically Signed   By: Franchot Gallo M.D.   On: 08/24/2013 06:43   Dg Chest Port 1 View  08/23/2013   CLINICAL DATA:  Extubation.  EXAM: PORTABLE CHEST - 1 VIEW  COMPARISON:  DG CHEST 1V PORT dated 08/22/2013; DG CHEST 1V PORT dated 08/21/2013  FINDINGS: Interim extubation. Left IJ line in stable position. Interim removal of NG tube. Progressive right lower lobe atelectasis is present. Cardiomegaly with pulmonary vascular prominence and interstitial prominence with bilateral pleural effusions. Findings consistent congestive heart failure. No pneumothorax.  IMPRESSION: 1. Interim extubation and removal of NG tube. Left IJ line in stable position. 2. Severe right lower lobe atelectasis and consolidation. 3. Congestive heart failure with pulmonary interstitial edema and bilateral pleural effusions.   Electronically Signed   By: Marcello Moores  Register   On: 08/23/2013 08:00    Assessment / Plan: 1.  Necrotizing fasciitis  Appears to be resolving  Nurses able to do open dressings at bedside - no VAC now  Clindamycin and Zosyn per medical service - ? Length of therapy  Debilitated - will need PT/OT and likely SNF/Rehab  Will follow  Earnstine Regal, MD, [facility] Office: [phone]  08/24/2013

## 2013-08-24 NOTE — Progress Notes (Signed)
PULMONARY / CRITICAL CARE MEDICINE  Name: [name] MRN: [id number] DOB: 05-01-1930    ADMISSION DATE:  08/12/2013 CONSULTATION DATE:  08/13/2013  REFERRING MD :  TR[facility]  CHIEF COMPLAINT:  Hypotension  BRIEF PATIENT DESCRIPTION:  80 y/o obese (250 lbs) female admitted 3/2 with R labial necrotizing fasciitis s/p debridement complicated by septic shock.  SIGNIFICANT EVENTS: 3/03  OR >>> Labia debridement 3/04  Oversedated with Dilaudid and required Narcan x 2  3/06  OR >>> Further debridement 3/08  Debrided at bedside, apneic with minimal sedation (Fentanyl 100 Versed 2)  3/09  Debridment 3/11  Diarrhea 3/14 Increased WOB > NPPV started. Edema pattern on CXR > diuresed. [place] discussion with husband. Full support including re-intubation if needed. Probably would not desire long term vent support  STUDIES:  3/3  Echo: EF 55 to 60%, mild LVH, grade 2 diastolic dysfx  LINES / TUBES: L rad A line  3/3 >>> 3/4 L IJ CVL 3/3 >>  ETT 3/3 >>> 3/12  CULTURES: 3/2  MRSA PCR >>> neg 3/2  Abscess >>> MULTIPLE ORGANISMS 3/2  Blood >>>neg 3/11 C diff >>>neg  ANTIBIOTICS: Vancomycin 3/2 >>>3/13 Zosyn 3/2 >>  Clindamycin  3/2 >>   INTERVAL HISTORY:   Poorly oriented. Increased WOB off NPPV. Moderately tachypneic on BiPAP but no overt distress  VITAL SIGNS: Temp:  [97.9 F (36.6 C)-98.7 F (37.1 C)] 98.7 F (37.1 C) (03/14 0800) Pulse Rate:  [65-96] 77 (03/14 1500) Resp:  [18-42] 27 (03/14 1500) BP: (90-187)/(24-72) 163/44 mmHg (03/14 1400) SpO2:  [92 %-100 %] 99 % (03/14 1500) FiO2 (%):  [40 %] 40 % (03/14 1409) Weight:  [126.7 kg (279 lb 5.2 oz)] 126.7 kg (279 lb 5.2 oz) (03/14 0352)  INTAKE / OUTPUT: Intake/Output     03/13 0701 - 03/14 0700 03/14 0701 - 03/15 0700   P.O. 480    I.V. (mL/kg) 1770 (14) 520 (4.1)   NG/GT     IV Piggyback 450 250   Total Intake(mL/kg) 2700 (21.3) 770 (6.1)   Urine (mL/kg/hr) 4870 (1.6) 1200 (1.1)   Total Output 4870 1200   Net -2170 -430       Stool Occurrence 1 x     PHYSICAL EXAMINATION: General: Intermittent agitation. Poorly oriented. Increased WOB Neuro: RASS -1 to +1. MAEs HEENT: WNL Cardiovascular:  Regular, no murmurs Lungs: bronchial BS in R base, faint [place] wheezes Abdomen: soft, non tender Ext: warm, symmetric bilateral edema   LABS:  I have reviewed all of today's lab results. Relevant abnormalities are discussed in the A/P section  CXR: edema pattern  ASSESSMENT / PLAN:  PULMONARY A:  Acute respiratory failure Pulmonary edema Atelectasis High risk of re-intubation P:   Cont NPPV Diruesis Daily CXR  CARDIOVASCULAR A:  Septic shock, resolved. Hypertension Chronic diastolic CHF Hyperlipidemia. P:  Cont current Rx  RENAL A:   AKI, resolved. Hypokalemia, resolved P:   Monitor BMET intermittently Monitor I/Os Correct electrolytes as indicated  GASTROINTESTINAL A:  No acute issues P:   NPO until resp status improves  HEMATOLOGIC / ONC A:   Anemia of critical illness Hx breast CA. P:  Trend CBC Cont anastrozole  Lovenox for DVT px  INFECTIOUS A:   Necrotizing fasciitis Rt labia. P:   Micro and abx as above  ENDOCRINE A:   Hyperglycemia. P:   Cont SSI  NEUROLOGIC A:   Post-op pain. Deconditioning. ICU acquired delirium P:   Cont Fentanyl PRN   Husband  updated @ bedside  40 mins CCM time including rerounding in afternoon   Merton Border, MD ; [facility] [phone].  After 5:30 PM or weekends, call [phone]

## 2013-08-24 NOTE — Progress Notes (Signed)
ANTIBIOTIC CONSULT NOTE - Follow Up  Pharmacy Consult for vancomycin and zosyn Indication: necrotizing fasciitis of buttock  Allergies  Allergen Reactions  . Ace Inhibitors Other (See Comments)    Tolerates lisinopril at home. Pt does not recall allergy.  . Atorvastatin     REACTION: myalgias  . Oxycodone-Acetaminophen     REACTION: confusion, fatigue  . Rosuvastatin     REACTION: leg weakness  . Simvastatin     REACTION: leg weakness  . Codeine Nausea And Vomiting and Rash  . Sulfonamide Derivatives Hives and Rash    Patient Measurements: Height: 5\' 2"  (157.5 cm) Weight: 279 lb 5.2 oz (126.7 kg) IBW/kg (Calculated) : 50.1   Vital Signs: Temp: 98.7 F (37.1 C) (03/14 0800) Temp src: Oral (03/14 0800) BP: 163/44 mmHg (03/14 1400) Pulse Rate: 85 (03/14 1400) Intake/Output from previous day: 03/13 0701 - 03/14 0700 In: 2700 [P.O.:480; I.V.:1770; IV Piggyback:450] Out: 4870 [Urine:4870] Intake/Output from this shift: Total I/O In: 650 [I.V.:450; IV Piggyback:200] Out: 525 [Urine:525]  Labs:  Recent Labs  08/22/13 0500 08/23/13 0453 08/24/13 0545  WBC 8.3 8.9 8.1  HGB 9.2* 9.9* 9.1*  PLT 252 292 302  CREATININE 1.02 0.94 0.99  Estimated CrCl ~ 50 mL/min/72 kg (wt-normalized)   Estimated Creatinine Clearance: 56.8 ml/min (by C-G formula based on Cr of 0.99). No results found for this basename: Letta Median, VANCORANDOM, GENTTROUGH, GENTPEAK, GENTRANDOM, TOBRATROUGH, TOBRAPEAK, TOBRARND, AMIKACINPEAK, AMIKACINTROU, AMIKACIN,  in the last 72 hours   Microbiology: 3/2 MRSA PCR: neg 3/2 blood x 2: NGF 3/2 abscess cx: multiple organisms present, no staph/strep; no anaerobes isolated to date. Final. 3/11 Cdiff: neg  Assessment: [name] presented to ED with 3 day history of abscess on L buttock along with generalized weakness.  Pharmacy consulted for Vancomycin and Zosyn management, MD dosing clindamycin for septic shock 2/2 buttock abscess, necrotizing  fasciitis of perineum s/p multiple debridements and drainage.   Day #13 Zosyn and Clindamycin.  Vancomycin discontinued after 11 days of treatment.  Afebrile  WBCs: improved to WNL  SCr normalized, CrCl~56 ml/min  Goal of Therapy:  Doses appropriate for renal function  Plan:  1.  Continue Zosyn 3.375gm IV q8h (4hr extended infusions) 2.  Continue Clindamycin 600mg  IV q8h per MD 3.  Per CCM, appears that planned length of treatment is 14 days.  Will f/u for discontinuation of abx after tomorrow's doses.  Hershal Coria, PharmD, BCPS Pager: [phone] 08/24/2013 2:13 PM

## 2013-08-24 NOTE — Progress Notes (Signed)
Pt put back on BIPAP at about 0450.

## 2013-08-25 ENCOUNTER — Inpatient Hospital Stay (HOSPITAL_COMMUNITY): Payer: Medicare Other

## 2013-08-25 DIAGNOSIS — J811 Chronic pulmonary edema: Secondary | ICD-10-CM

## 2013-08-25 LAB — GLUCOSE, CAPILLARY
Glucose-Capillary: 123 mg/dL — ABNORMAL HIGH (ref 70–99)
Glucose-Capillary: 112 mg/dL — ABNORMAL HIGH (ref 70–99)
Glucose-Capillary: 109 mg/dL — ABNORMAL HIGH (ref 70–99)
Glucose-Capillary: 106 mg/dL — ABNORMAL HIGH (ref 70–99)
Glucose-Capillary: 152 mg/dL — ABNORMAL HIGH (ref 70–99)

## 2013-08-25 LAB — CBC
HCT: 32 % — ABNORMAL LOW (ref 36.0–46.0)
Hemoglobin: 10.2 g/dL — ABNORMAL LOW (ref 12.0–15.0)
MCH: 29.8 pg (ref 26.0–34.0)
MCHC: 31.9 g/dL (ref 30.0–36.0)
MCV: 93.6 fL (ref 78.0–100.0)
Platelets: 358 10*3/uL (ref 150–400)
RBC: 3.42 MIL/uL — ABNORMAL LOW (ref 3.87–5.11)
RDW: 17.9 % — ABNORMAL HIGH (ref 11.5–15.5)
WBC: 9.9 10*3/uL (ref 4.0–10.5)

## 2013-08-25 LAB — BASIC METABOLIC PANEL
BUN: 12 mg/dL (ref 6–23)
CO2: 34 mEq/L — ABNORMAL HIGH (ref 19–32)
Calcium: 9 mg/dL (ref 8.4–10.5)
Chloride: 102 mEq/L (ref 96–112)
Creatinine, Ser: 1.04 mg/dL (ref 0.50–1.10)
GFR calc Af Amer: 57 mL/min — ABNORMAL LOW (ref >90)
GFR calc non Af Amer: 49 mL/min — ABNORMAL LOW (ref >90)
Glucose, Bld: 111 mg/dL — ABNORMAL HIGH (ref 70–99)
Potassium: 4 mEq/L (ref 3.7–5.3)
Sodium: 145 mEq/L (ref 137–147)

## 2013-08-25 MED ORDER — FUROSEMIDE 10 MG/ML IJ SOLN
40.0000 mg | Freq: Four times a day (QID) | INTRAMUSCULAR | Status: AC
  Administered 2013-08-25 (×2): 40 mg via INTRAVENOUS
  Filled 2013-08-25 (×2): qty 4

## 2013-08-25 MED ORDER — FENTANYL CITRATE 0.05 MG/ML IJ SOLN
25.0000 ug | INTRAMUSCULAR | Status: DC | PRN
  Administered 2013-08-25 – 2013-08-27 (×7): 25 ug via INTRAVENOUS
  Filled 2013-08-25 (×7): qty 2

## 2013-08-25 MED ORDER — POTASSIUM CHLORIDE CRYS ER 20 MEQ PO TBCR
40.0000 meq | EXTENDED_RELEASE_TABLET | Freq: Once | ORAL | Status: AC
  Administered 2013-08-25: 40 meq via ORAL
  Filled 2013-08-25: qty 2

## 2013-08-25 MED ORDER — ASPIRIN EC 325 MG PO TBEC
325.0000 mg | DELAYED_RELEASE_TABLET | Freq: Every day | ORAL | Status: DC
  Administered 2013-08-25 – 2013-08-29 (×5): 325 mg via ORAL
  Filled 2013-08-25 (×5): qty 1

## 2013-08-25 NOTE — Progress Notes (Signed)
Patient ID: [name], female   DOB: 26-Dec-1929, 80 y.o.   MRN: [id number]  General Surgery - [facility] - Progress Note  POD# multiple  Subjective: Patient alert and responsive.  Husband and son at bedside.  No complaints.  Wants out of bed.  Objective: Vital signs in last 24 hours: Temp:  [98 F (36.7 C)-99.3 F (37.4 C)] 98.5 F (36.9 C) (03/15 0800) Pulse Rate:  [70-92] 81 (03/15 0800) Resp:  [17-42] 26 (03/15 0800) BP: (103-187)/(33-60) 165/53 mmHg (03/15 0800) SpO2:  [94 %-100 %] 98 % (03/15 0800) FiO2 (%):  [40 %] 40 % (03/15 0839) Last BM Date: 08/25/13  Intake/Output from previous day: 03/14 0701 - 03/15 0700 In: 2000 [I.V.:1550; IV Piggyback:450] Out: 4250 [Urine:4250]  Exam: HEENT - clear, not icteric Neck - soft Chest - coarse bilaterally Cor - RRR, no murmur Abd - soft, non-tender GU - dressing dry and intact - BID changes  Lab Results:   Recent Labs  08/24/13 0545 08/25/13 0445  WBC 8.1 9.9  HGB 9.1* 10.2*  HCT 29.5* 32.0*  PLT 302 358     Recent Labs  08/24/13 0545 08/25/13 0445  NA 145 145  K 3.6* 4.0  CL 106 102  CO2 31 34*  GLUCOSE 129* 111*  BUN 14 12  CREATININE 0.99 1.04  CALCIUM 8.6 9.0    Studies/Results: Dg Chest Port 1 View  08/25/2013   CLINICAL DATA:  Respiratory failure  EXAM: PORTABLE CHEST - 1 VIEW  COMPARISON:  08/24/2013  FINDINGS: Left jugular central venous line is again seen and stable. Cardiac shadow is stable. Bilateral pleural effusions are noted as well as a degree of vascular congestion the overall appearance is stable from the prior study.  IMPRESSION: No significant interval change from the prior exam.   Electronically Signed   By: Inez Catalina M.D.   On: 08/25/2013 07:32   Dg Chest Port 1 View  08/24/2013   CLINICAL DATA:  Followup atelectasis  EXAM: PORTABLE CHEST - 1 VIEW  COMPARISON:  08/23/2013  FINDINGS: Progression of right lower lobe airspace consolidation. There is a moderate  right effusion.  Left lower lobe consolidation and small left effusion are similar.  There is vascular congestion with probable mild edema  Left jugular catheter tip in the SVC.  IMPRESSION: Findings consistent with congestive heart failure with edema and bilateral effusions.  Progression of right lower lobe consolidation which may be atelectasis or infiltrate.   Electronically Signed   By: Franchot Gallo M.D.   On: 08/24/2013 06:43    Assessment / Plan: 1. Necrotizing fasciitis   Appears to be resolving   Dressing changes to perineum BID at bedside   Clindamycin and Zosyn per medical service - ? Length of therapy   Debilitated - will need PT/OT and likely SNF/Rehab   Will follow  Earnstine Regal, MD, [facility] Office: [phone]  08/25/2013

## 2013-08-25 NOTE — Progress Notes (Signed)
PULMONARY / CRITICAL CARE MEDICINE  Name: [name] MRN: [id number] DOB: 01/23/1930    ADMISSION DATE:  08/12/2013 CONSULTATION DATE:  08/13/2013  REFERRING MD :  TRH  CHIEF COMPLAINT:  Hypotension  BRIEF PATIENT DESCRIPTION:  80 y/o obese (250 lbs) female admitted 3/2 with R labial necrotizing fasciitis s/p debridement complicated by septic shock.  SIGNIFICANT EVENTS: 3/03  OR >>> Labia debridement 3/04  Oversedated with Dilaudid and required Narcan x 2  3/06  OR >>> Further debridement 3/08  Debrided at bedside, apneic with minimal sedation (Fentanyl 100 Versed 2)  3/09  Debridment 3/11  Diarrhea 3/14 Increased WOB > NPPV started. Edema pattern on CXR > diuresed. [place]y discussion with husband. Full support including re-intubation if needed. Probably would not desire long term vent support  STUDIES:  3/3  Echo: EF 55 to 60%, mild LVH, grade 2 diastolic dysfx  LINES / TUBES: L rad A line  3/3 >>> 3/4 L IJ CVL 3/3 >>  ETT 3/3 >>> 3/12  CULTURES: 3/2  MRSA PCR >>> neg 3/2  Abscess >>> MULTIPLE ORGANISMS 3/2  Blood >>>neg 3/11 C diff >>>neg  ANTIBIOTICS: Vancomycin 3/2 >>>3/13 Clindamycin  3/2 >> 3/15 Zosyn 3/2 >>    INTERVAL HISTORY:   Less dyspneic. Moderately tachypneic on [place] O2. Better oriented  VITAL SIGNS: Temp:  [98 F (36.7 C)-99.3 F (37.4 C)] 98.5 F (36.9 C) (03/15 0800) Pulse Rate:  [70-99] 93 (03/15 1400) Resp:  [17-40] 18 (03/15 1400) BP: (103-187)/(32-60) 154/50 mmHg (03/15 1400) SpO2:  [94 %-100 %] 96 % (03/15 1400) FiO2 (%):  [40 %] 40 % (03/15 1420)  INTAKE / OUTPUT: Intake/Output     03/14 0701 - 03/15 0700 03/15 0701 - 03/16 0700   P.O.     I.V. (mL/kg) 1550 (12.2) 560 (4.4)   IV Piggyback 450 50   Total Intake(mL/kg) 2000 (15.8) 610 (4.8)   Urine (mL/kg/hr) 4250 (1.4) 1970 (1.9)   Total Output 4250 1970   Net -2250 -1360        Stool Occurrence 1 x     PHYSICAL EXAMINATION: General: tachypneic but no overt distress Neuro: RASS 0.  + F/C. MAEs HEENT: WNL Cardiovascular:  Regular, no murmurs Lungs: bronchial BS in R base, no wheezes Abdomen: soft, non tender Ext: warm, symmetric bilateral edema   LABS:  I have reviewed all of today's lab results. Relevant abnormalities are discussed in the A/P section  CXR: [place] to mildly improved edema pattern  ASSESSMENT / PLAN:  PULMONARY A:  Acute respiratory failure Pulmonary edema Atelectasis High risk of re-intubation P:   Cont NPPV PRN and HS Cont diuresis Monitor CXR for resolution  CARDIOVASCULAR A:  Septic shock, resolved. Hypertension Chronic diastolic CHF Hyperlipidemia. P:  Cont current Rx  RENAL A:   AKI, resolved. Hypokalemia, resolved P:   Monitor BMET intermittently Monitor I/Os Correct electrolytes as indicated  GASTROINTESTINAL A:  No acute issues P:   SUP N/I Advance diet  HEMATOLOGIC / ONC A:   Anemia of critical illness Hx breast CA. P:  Trend CBC Cont anastrozole  Lovenox for DVT px  INFECTIOUS A:   Necrotizing fasciitis Rt labia. P:   Micro and abx as above CCS following  ENDOCRINE A:   Hyperglycemia. P:   Cont SSI  NEUROLOGIC A:   Post-op pain. Deconditioning. ICU acquired delirium - improved P:   Cont Fentanyl PRN   Son updated @ bedside   Merton Border, MD ; [facility] service [place] (  [phone].  After 5:30 PM or weekends, call [phone]

## 2013-08-26 ENCOUNTER — Inpatient Hospital Stay (HOSPITAL_COMMUNITY): Payer: Medicare Other

## 2013-08-26 LAB — BASIC METABOLIC PANEL
BUN: 12 mg/dL (ref 6–23)
CO2: 38 mEq/L — ABNORMAL HIGH (ref 19–32)
Calcium: 9.3 mg/dL (ref 8.4–10.5)
Chloride: 99 mEq/L (ref 96–112)
Creatinine, Ser: 1.17 mg/dL — ABNORMAL HIGH (ref 0.50–1.10)
GFR calc Af Amer: 49 mL/min — ABNORMAL LOW (ref >90)
GFR calc non Af Amer: 43 mL/min — ABNORMAL LOW (ref >90)
Glucose, Bld: 114 mg/dL — ABNORMAL HIGH (ref 70–99)
Potassium: 4 mEq/L (ref 3.7–5.3)
Sodium: 145 mEq/L (ref 137–147)

## 2013-08-26 LAB — CBC
HCT: 31.6 % — ABNORMAL LOW (ref 36.0–46.0)
Hemoglobin: 9.6 g/dL — ABNORMAL LOW (ref 12.0–15.0)
MCH: 29 pg (ref 26.0–34.0)
MCHC: 30.4 g/dL (ref 30.0–36.0)
MCV: 95.5 fL (ref 78.0–100.0)
Platelets: 317 10*3/uL (ref 150–400)
RBC: 3.31 MIL/uL — ABNORMAL LOW (ref 3.87–5.11)
RDW: 18 % — ABNORMAL HIGH (ref 11.5–15.5)
WBC: 10.3 10*3/uL (ref 4.0–10.5)

## 2013-08-26 LAB — HEPATIC FUNCTION PANEL
ALT: 10 U/L (ref 0–35)
AST: 19 U/L (ref 0–37)
Albumin: 2.1 g/dL — ABNORMAL LOW (ref 3.5–5.2)
Alkaline Phosphatase: 56 U/L (ref 39–117)
Bilirubin, Direct: <0.2 mg/dL (ref 0.0–0.3)
Total Bilirubin: 0.5 mg/dL (ref 0.3–1.2)
Total Protein: 6 g/dL (ref 6.0–8.3)

## 2013-08-26 LAB — GLUCOSE, CAPILLARY
Glucose-Capillary: 115 mg/dL — ABNORMAL HIGH (ref 70–99)
Glucose-Capillary: 125 mg/dL — ABNORMAL HIGH (ref 70–99)

## 2013-08-26 LAB — PRO B NATRIURETIC PEPTIDE: Pro B Natriuretic peptide (BNP): 9195 pg/mL — ABNORMAL HIGH (ref 0–450)

## 2013-08-26 MED ORDER — FUROSEMIDE 10 MG/ML IJ SOLN
40.0000 mg | Freq: Two times a day (BID) | INTRAMUSCULAR | Status: AC
  Administered 2013-08-26 – 2013-08-27 (×2): 40 mg via INTRAVENOUS
  Filled 2013-08-26 (×2): qty 4

## 2013-08-26 MED ORDER — ENSURE COMPLETE PO LIQD
237.0000 mL | Freq: Four times a day (QID) | ORAL | Status: DC
  Administered 2013-08-27 – 2013-08-29 (×5): 237 mL via ORAL

## 2013-08-26 MED ORDER — LOPERAMIDE HCL 2 MG PO CAPS
2.0000 mg | ORAL_CAPSULE | ORAL | Status: DC | PRN
  Administered 2013-08-26 (×2): 2 mg via ORAL
  Filled 2013-08-26 (×2): qty 1

## 2013-08-26 MED ORDER — POTASSIUM CHLORIDE CRYS ER 20 MEQ PO TBCR
40.0000 meq | EXTENDED_RELEASE_TABLET | Freq: Once | ORAL | Status: AC
  Administered 2013-08-26: 40 meq via ORAL
  Filled 2013-08-26: qty 2

## 2013-08-26 NOTE — Progress Notes (Signed)
PT Cancellation Note  ___Treatment cancelled today due to medical issues with patient which prohibited   therapy  ___ Treatment cancelled today due to patient receiving procedure or test   ___ Treatment cancelled today due to patient's refusal to participate   _X_ Attempted x 3 to see pt.  1st dressing change/bath                                                    2nd eating lunch                                                    3rd just got back to bed from recliner + another dressing change                                          Will check back tomorrow  Rica Koyanagi  PTA [facility]  Acute  Rehab Pager      [phone]

## 2013-08-26 NOTE — Progress Notes (Signed)
NUTRITION FOLLOW UP  Intervention:   - Educated pt on importance of nutrition for wound healing and encouraged high protein foods/beverages - Ensure Complete QID  - Will order snacks in between meals  - Will continue to monitor   Nutrition Dx:   Inadequate oral intake related to inability to eat as evidenced by NPO - ongoing but now related to poor appetite as evidenced by 25% meal intake.    Goal:   Advance diet per SLP - met, diet advanced to dysphagia 3/thin  New goal Pt to consume >90% of meals/supplements   Monitor:   Weights, labs, intake, nausea  Assessment:   Admitted with abscess on buttocks x 3 days, found to have necrotizing fascitis of the perineum. Pt with history of paroxysmal atrial flutter, T2DM, morbid obesity, CVA, GERD, post-operative DVT, CKD, breast CA, anxiety, and anemia. Had incision and drainage of abscess 08/12/13.   3/3 - Met with pt and family who report pt has been hardly eating anything for the past 3 days. States before then she was eating 2 meals/day and some snacks and reports her meals were well balanced. Denies any nausea or diarrhea today, had both yesterday. Denies any changes in weight.   3/4 - Noted events of earlier this morning with decrease in oxygen saturations and pt becoming unresponsive. Intubated today. Plans for additional surgery today. Obtained verbal order from NP to manage TF. Pt with mittens on.   3/9 - Had additional surgical debridement 3/4. Had frequent loose stools 3/4 and flexiseal was placed. Had repeat surgical debridement of perineum wound 3/6. Had debridement at bedside 3/8. Per conversation with RN, pt tolerating TF well before it was turned off this morning at 7:30am for surgery later on today. Remains intubated. Pt's weight up 41 pounds since admission. Pt with 160m rectal tube output yesterday, same as the day before.   3/13 - Had debridement and irrigation of perineum wound 3/9, had further debridement and irrigation of  abscess 34/23with application of wound VAC. TF held 3/11 as pt was stooling through the wound VAC preventing it from being sealed. Extubated 3/12. Diarrhea improving since TF and Prostat d/c 3/12. Awaiting SLP evaluation.  3/16 - Had bedside swallow evaluation 3/13 with recommendation for dysphagia 3/thin which was ordered. Had ongoing diarrhea today contaminating perineum wound, MD questioned need for possible diverting colostomy. PO intake has been 25% of meals. Met with pt who reports she ate a good breakfast today. Denies any nausea. Agreeable to getting Ensure Complete. Pt tearful during visit stating she was grateful for all the care we've given her.    Height: Ht Readings from Last 1 Encounters:  08/14/13 5' 2"  (1.575 m)    Weight Status:   Wt Readings from Last 1 Encounters:  08/24/13 279 lb 5.2 oz (126.7 kg)  Admit wt:        250 lb (113.3 kg) Net I/Os: +9.8L  Re-estimated needs:  Kcal: 1500-1650 Protein: 125g Fluid: >1.6L/day  Skin: +1 generalized edema, RUE, LUE, RLE, LLE edema, perineum incision, right buttocks incision    Diet Order: Dysphagia 3, thin   Intake/Output Summary (Last 24 hours) at 08/26/13 1249 Last data filed at 08/26/13 1200  Gross per 24 hour  Intake   1730 ml  Output   4575 ml  Net  -2845 ml    Last BM: 3/16   Labs:   Recent Labs Lab 08/23/13 0453 08/24/13 0545 08/25/13 0445 08/26/13 0547  NA 145 145 145 145  K 3.4* 3.6* 4.0 4.0  CL 105 106 102 99  CO2 30 31 34* 38*  BUN 16 14 12 12   CREATININE 0.94 0.99 1.04 1.17*  CALCIUM 8.8 8.6 9.0 9.3  MG 1.6  --   --   --   GLUCOSE 119* 129* 111* 114*    CBG (last 3)   Recent Labs  08/25/13 1708 08/25/13 2144 08/26/13 1205  GLUCAP 106* 152* 125*    Scheduled Meds: . albuterol  2.5 mg Nebulization Q6H  . anastrozole  1 mg Oral q morning - 10a  . antiseptic oral rinse  15 mL Mouth Rinse QID  . aspirin EC  325 mg Oral Daily  . chlorhexidine  15 mL Mouth Rinse BID  . enoxaparin  (LOVENOX) injection  60 mg Subcutaneous Q24H  . furosemide  40 mg Intravenous Q12H  . piperacillin-tazobactam (ZOSYN)  IV  3.375 g Intravenous Q8H  . potassium chloride  40 mEq Oral Once    Continuous Infusions: . sodium chloride 20 mL/hr at 08/24/13 1800  . dextrose 5 % with kcl 50 mL/hr at 08/25/13 [place], [place], [place] Pager [phone] After Hours Pager

## 2013-08-26 NOTE — Progress Notes (Signed)
Clinical Social Work Department BRIEF PSYCHOSOCIAL ASSESSMENT 08/26/2013  Patient:  [name],[name]     Account Number:  [id number]     Admit date:  08/12/2013  Clinical Social Worker:  Renold Genta  Date/Time:  08/26/2013 03:20 PM  Referred by:    Date Referred:   Referred for  SNF Placement   Other Referral:   Interview type:  Patient Other interview type:   and husband at bedside    PSYCHOSOCIAL DATA Living Status:  HUSBAND Admitted from facility:   Level of care:   Primary support name:  Haydyn Mackins (husband) h#: [phone] c#: [phone] Primary support relationship to patient:  SPOUSE Degree of support available:   good    CURRENT CONCERNS Current Concerns  Post-Acute Placement   Other Concerns:    SOCIAL WORK ASSESSMENT / PLAN CSW reviewed PT evaluation recommending SNF for patient at discharge.   Assessment/plan status:  Information/Referral to [employer] Other assessment/ plan:   Information/referral to community resources:   CSW completed FL2 and faxed information out to [facility] - patient expressed interest in [facility].    PATIENT'S/FAMILY'S RESPONSE TO PLAN OF CARE: Patient states that she lives with her husband but agrees that SNF would be best for her at discharge and that [facility] would be closest for her husband & son. CSW confirmed with [facility] [place] that they would be able to take patient when stable for discharge.       Raynaldo Opitz, [facility] Clinical Social Worker cell #: [phone]

## 2013-08-26 NOTE — Progress Notes (Signed)
Sitting up eating breakfast.  Will check wound later.

## 2013-08-26 NOTE — Progress Notes (Signed)
Will continue current dressing changes for now.  Hold on colostomy at this time.

## 2013-08-26 NOTE — Progress Notes (Signed)
6 Days Post-Op  Subjective: Awake, off Vent, still incontinent of stool.  Very tolerant of all we are having to do to care for her.  She looks very tired and worn out. She did not complain at all while I was with her.  Objective: Vital signs in last 24 hours: Temp:  [98.3 F (36.8 C)-99.4 F (37.4 C)] 99 F (37.2 C) (03/16 0800) Pulse Rate:  [71-99] 87 (03/16 1000) Resp:  [18-38] 33 (03/16 1000) BP: (148-179)/(32-57) 154/41 mmHg (03/16 1000) SpO2:  [93 %-99 %] 97 % (03/16 1000) FiO2 (%):  [40 %] 40 % (03/16 0800) Last BM Date: 08/26/13 1750 PO intake 08/25/13. Nurse reports loose stools 1-2 times per shift, She is afebrile, VSS ProBNP 9195 Creatinine is up some, this is new, since 08/16/13 when creatinine was back to normal. C diff was negative on 08/21/13. Intake/Output from previous day: 03/15 0701 - 03/16 0700 In: 1887.5 [I.V.:1750; IV Piggyback:137.5] Out: 5505 [Urine:5505] Intake/Output this shift: Total I/O In: 210 [I.V.:210] Out: 280 [Urine:280]  General appearance: alert, cooperative, mild distress and she just looks worn out with dressing change just beginning. Skin: Skin color, texture, turgor normal. No rashes or lesions or She has a generalized  rash all over that is itching her constantly, Open Penineal wound is clean but has stool at the base.  Lab Results:   Recent Labs  08/25/13 0445 08/26/13 0547  WBC 9.9 10.3  HGB 10.2* 9.6*  HCT 32.0* 31.6*  PLT 358 317    BMET  Recent Labs  08/25/13 0445 08/26/13 0547  NA 145 145  K 4.0 4.0  CL 102 99  CO2 34* 38*  GLUCOSE 111* 114*  BUN 12 12  CREATININE 1.04 1.17*  CALCIUM 9.0 9.3   PT/INR No results found for this basename: LABPROT, INR,  in the last 72 hours  No results found for this basename: AST, ALT, ALKPHOS, BILITOT, PROT, ALBUMIN,  in the last 168 hours   Lipase     Component Value Date/Time   LIPASE 28 12/11/2010 1755     Studies/Results: Dg Chest Port 1 View  08/26/2013   CLINICAL  DATA:  Respiratory failure.  EXAM: PORTABLE CHEST - 1 VIEW  COMPARISON:  Chest x-ray 08/25/2013.  FINDINGS: There is a left-sided internal jugular central venous catheter with tip terminating in the mid superior vena cava. Lung volumes are low. There is cephalization of the pulmonary vasculature and slight indistinctness of the interstitial markings suggestive of mild pulmonary edema. Small to moderate bilateral pleural effusions. Mild cardiomegaly. The patient is rotated to the left on today's exam, resulting in distortion of the mediastinal contours and reduced diagnostic sensitivity and specificity for mediastinal pathology. Atherosclerosis in the thoracic aorta.  IMPRESSION: 1. Support apparatus, as above. 2. Low lung volumes with evidence of congestive heart failure, as above.   Electronically Signed   By: Vinnie Langton M.D.   On: 08/26/2013 07:09   Dg Chest Port 1 View  08/25/2013   CLINICAL DATA:  Respiratory failure  EXAM: PORTABLE CHEST - 1 VIEW  COMPARISON:  08/24/2013  FINDINGS: Left jugular central venous line is again seen and stable. Cardiac shadow is stable. Bilateral pleural effusions are noted as well as a degree of vascular congestion the overall appearance is stable from the prior study.  IMPRESSION: No significant interval change from the prior exam.   Electronically Signed   By: Inez Catalina M.D.   On: 08/25/2013 07:32    Medications: . albuterol  2.5 mg Nebulization Q6H  . anastrozole  1 mg Oral q morning - 10a  . antiseptic oral rinse  15 mL Mouth Rinse QID  . aspirin EC  325 mg Oral Daily  . chlorhexidine  15 mL Mouth Rinse BID  . enoxaparin (LOVENOX) injection  60 mg Subcutaneous Q24H  . piperacillin-tazobactam (ZOSYN)  IV  3.375 g Intravenous Q8H   Prior to Admission medications   Medication Sig Start Date End Date Taking? Authorizing Provider  allopurinol (ZYLOPRIM) 100 MG tablet Take 100 mg by mouth every other day.   Yes Historical Provider, MD  anastrozole  (ARIMIDEX) 1 MG tablet Take 1 mg by mouth every morning.   Yes Historical Provider, MD  aspirin 325 MG EC tablet Take 325 mg by mouth every other day.   Yes Historical Provider, MD  aspirin EC 81 MG tablet Take 243 mg by mouth every other day.    Yes Historical Provider, MD  cholecalciferol (VITAMIN D) 1000 UNITS tablet Take 1,000 Units by mouth every morning.    Yes Historical Provider, MD  furosemide (LASIX) 40 MG tablet Take 40 mg by mouth every morning.   Yes Historical Provider, MD  lisinopril (PRINIVIL,ZESTRIL) 20 MG tablet Take 40 mg by mouth daily after breakfast.   Yes Historical Provider, MD  polyvinyl alcohol (LIQUIFILM TEARS) 1.4 % ophthalmic solution Place 2 drops into both eyes daily as needed for dry eyes.   Yes Historical Provider, MD  traMADol-acetaminophen (ULTRACET) 37.5-325 MG per tablet Take 1 tablet by mouth every 6 (six) hours as needed for pain. 04/17/12  Yes Biagio Borg, MD     Assessment/Plan 1.  Necrotizing Fasciitis of the perineum 2.  S/p Debridement and drainage 08/13/13,TG; 08/15/13, TG; 08/16/13, TG; 08/19/13  AR; 08/20/13 AR, with wound vac placement that could not remain  sealed. 3.  Sepsis secondary to fasciitis 4.  Hypotension/with hx of hypertension 5.  Acute respiratory failure/heart failure with elevated Pro"BNP, 10639 on 08/16/13 6.  Acute Kidney failure 7.  Morbid obesity  Body mass index is 51 8.  Hx of cardiac disease, rheumatic fever, followed by [facility] 9.  Hx of AF/DVT 10. Right breast CA with lumpectomy/radiation Rx/I/D of breast abscess   2013 Dr. Hassell Done 11.  Bilateral knee replacements/right hip replacement (using a walker Jan.  2014) 12.  Hx of colon perforation during colonoscopy 1990's 13. ERCP/cholecystectomy 2013 14. Hx of stroke 15. Diabetes in 2012 note, but I do not see any place where she has been        treated for this. 16.  Gout   Plan:  I have recommended a bariatric bed, just to give [place] more room to work. Dressings are very  difficult and she continues to stool into the base of the wound 2-4 times per day, around the rectum.  The open site is clean and healing well.  I do not see an albumin or protein to evaluate her nutrition status.  It takes at least 2 people to do her dressing change and clean her stool up.   The big question is can she heal this without diverting ostomy.  With her size an ostomy of any type is not without risk, and she has had cholecystectomy, and colon surgery for perforation in the past also.  I will add LFT'S and pre albumin to labs and discuss with Dr. Zella Richer. Day 15 of Zosyn, and she had 8 days of Vancomycin (last dose; 3/12.)    LOS: 14 days  [facility] 08/26/2013

## 2013-08-26 NOTE — Progress Notes (Signed)
Pt has had her second loose bowel movement this shift and is complaining of a stinging irritation. Received order for flexiseal to provide protection for incision area. The peri area is redden and is very tender to touch. Barrier cream applied to area away from incision. Incision cleansed with normal saline, moist stretch gauze applied and covered with adb pads.

## 2013-08-26 NOTE — Progress Notes (Signed)
PULMONARY / CRITICAL CARE MEDICINE  Name: [name] MRN: [id number] DOB: 09-25-1929    ADMISSION DATE:  08/12/2013 CONSULTATION DATE:  08/13/2013  REFERRING MD :  TRH  CHIEF COMPLAINT:  Hypotension  BRIEF PATIENT DESCRIPTION:  80 y/o obese (250 lbs) female admitted 3/2 with R labial necrotizing fasciitis s/p debridement complicated by septic shock.  SIGNIFICANT EVENTS: 3/03  OR >>> Labia debridement 3/04  Oversedated with Dilaudid and required Narcan x 2  3/06  OR >>> Further debridement 3/08  Debrided at bedside, apneic with minimal sedation (Fentanyl 100 Versed 2)  3/09  Debridment 3/11  Diarrhea 3/14  Increased WOB > NPPV started. Edema pattern on CXR > diuresed. [place] discussion with husband. Full support including re-intubation if needed. Probably would not desire long term vent support 3/16  Remains extubated, volume up, ongoing diarrhea despite cessation of TF  STUDIES:  3/3  Echo: EF 55 to 60%, mild LVH, grade 2 diastolic dysfx  LINES / TUBES: L rad A line  3/3 >>> 3/4 L IJ CVL 3/3 >>  ETT 3/3 >>> 3/12  CULTURES: 3/2  MRSA PCR >>> neg 3/2  Abscess >>> MULTIPLE ORGANISMS 3/2  Blood >>>neg 3/11 C diff >>>neg  ANTIBIOTICS: Vancomycin 3/2 >>>3/13 Clindamycin  3/2 >> 3/15 Zosyn 3/2 >>    INTERVAL HISTORY:  RN reports ongoing diarrhea, contaminating peri wound  VITAL SIGNS: Temp:  [98.3 F (36.8 C)-99.4 F (37.4 C)] 99 F (37.2 C) (03/16 0800) Pulse Rate:  [71-99] 87 (03/16 1000) Resp:  [18-38] 33 (03/16 1000) BP: (148-179)/(32-57) 154/41 mmHg (03/16 1000) SpO2:  [93 %-99 %] 97 % (03/16 1000) FiO2 (%):  [40 %] 40 % (03/16 0800)  INTAKE / OUTPUT: Intake/Output     03/15 0701 - 03/16 0700 03/16 0701 - 03/17 0700   I.V. (mL/kg) 1750 (13.8) 210 (1.7)   IV Piggyback 137.5    Total Intake(mL/kg) 1887.5 (14.9) 210 (1.7)   Urine (mL/kg/hr) 5505 (1.8) 280 (0.6)   Total Output 5505 280   Net -3617.5 -70        Stool Occurrence 1 x     PHYSICAL  EXAMINATION: General: deconditioned, morbidly obese in NAD Neuro: RASS 0. + F/C. MAEs HEENT: WNL Cardiovascular:  Regular, no murmurs Lungs: even /non-labored, lungs bilaterally diminished but clear Abdomen: soft, non tender Ext: warm, symmetric bilateral edema Skin: R labial wound packed   LABS:   Recent Labs Lab 08/24/13 0545 08/25/13 0445 08/26/13 0547  HGB 9.1* 10.2* 9.6*  HCT 29.5* 32.0* 31.6*  WBC 8.1 9.9 10.3  PLT 302 358 317    Recent Labs Lab 08/22/13 0500 08/23/13 0453 08/24/13 0545 08/25/13 0445 08/26/13 0547  NA 145 145 145 145 145  K 3.2* 3.4* 3.6* 4.0 4.0  CL 108 105 106 102 99  CO2 28 30 31  34* 38*  GLUCOSE 111* 119* 129* 111* 114*  BUN 23 16 14 12 12   CREATININE 1.02 0.94 0.99 1.04 1.17*  CALCIUM 8.5 8.8 8.6 9.0 9.3  MG  --  1.6  --   --   --      CXR: 3/16 > edema pattern, no overt infiltrate, low lung volumes  ASSESSMENT / PLAN:  PULMONARY A:  Acute respiratory failure Pulmonary edema Atelectasis High risk of re-intubation P:   Cont NPPV PRN and HS Oxygen to support sats > 93% Repeat diuresis 3/16 - 40 BID x2 Monitor CXR for resolution Pulmonary hygiene   CARDIOVASCULAR A:  Septic shock, resolved. Hypertension Chronic  diastolic CHF Hyperlipidemia. P:  Continue ASA, PRN hydralazine Hold home lisinopril   RENAL A:   AKI, resolved. Hypokalemia P:   Trend BMP Monitor I/Os Correct electrolytes as indicated  GASTROINTESTINAL A:  Diarrhea Dysphagia P:   SUP N/I Advance diet as tolerated PRN Imodium trial 3/16, C-diff neg Diarrhea contaminating peri wound, ? If she will need diverting colostomy.    HEMATOLOGIC / ONC A:   Anemia of critical illness Hx breast CA. P:  Trend CBC Cont anastrozole  Lovenox for DVT px  INFECTIOUS A:   Necrotizing fasciitis Rt labia. P:   Micro and abx as above CCS following  ENDOCRINE A:   Hyperglycemia. P:   Cont SSI  NEUROLOGIC A:   Post-op pain Deconditioning ICU  acquired delirium - improved P:   Cont Fentanyl PRN PT following, likely will require SNF for rehab efforts   GLOBAL If continues to improve from resp standpoint, will tx to [facility] in am  PT as tolerated  [name], NP-C [facility] Pgr: [phone] or [phone]   Attending:  I have seen and examined the patient with nurse practitioner/resident and agree with the note above.   Making great progress.  Transfer to [facility], SDU.  Jillyn Hidden PCCM Pager: [phone] Cell: [phone] If no response, call [phone]

## 2013-08-26 NOTE — Progress Notes (Signed)
[place], RN, BSN, CCM [phone] Chart Reviewed for discharge and hospital needs. Discharge needs at time of review:  None present will follow for needs. Review of patient progress due on [id number]. pt remains able to be extubated, dressing changes require two people and take approx. 15 to 9mins to do due to size and location.  Wound improving despite stool contamination,even with the tube feeds being stopped several days ago. Patient is eating a diet at this time.

## 2013-08-26 NOTE — Progress Notes (Signed)
Clinical Social Work Department CLINICAL SOCIAL WORK PLACEMENT NOTE 08/26/2013  Patient:  [name],[name]  Account Number:  [id number] Admit date:  08/12/2013  Clinical Social Worker:  Renold Genta  Date/time:  08/26/2013 03:24 PM  Clinical Social Work is seeking post-discharge placement for this patient at the following level of care:   SKILLED NURSING   (*CSW will update this form in Epic as items are completed)   08/26/2013  Patient/family provided with [place] Department of Clinical Social Work's list of facilities offering this level of care within the geographic area requested by the patient (or if unable, by the patient's family).  08/26/2013  Patient/family informed of their freedom to choose among providers that offer the needed level of care, that participate in Medicare, Medicaid or managed care program needed by the patient, have an available bed and are willing to accept the patient.  08/26/2013  Patient/family informed of [facility]' ownership interest in [facility], as well as of the fact that they are under no obligation to receive care at this facility.  PASARR submitted to EDS on 08/26/2013 PASARR number received from EDS on 08/26/2013  FL2 transmitted to all facilities in geographic area requested by pt/family on  08/26/2013 FL2 transmitted to all facilities within larger geographic area on   Patient informed that his/her managed care company has contracts with or will negotiate with  certain facilities, including the following:     Patient/family informed of bed offers received:  08/26/2013 Patient chooses bed at [facility] Physician recommends and patient chooses bed at    Patient to be transferred to  on   Patient to be transferred to facility by   The following physician request were entered in Epic:   Additional Comments:   Raynaldo Opitz, [place] Worker cell  #: [phone]

## 2013-08-27 ENCOUNTER — Inpatient Hospital Stay (HOSPITAL_COMMUNITY): Payer: Medicare Other

## 2013-08-27 DIAGNOSIS — R197 Diarrhea, unspecified: Secondary | ICD-10-CM | POA: Diagnosis present

## 2013-08-27 DIAGNOSIS — R21 Rash and other nonspecific skin eruption: Secondary | ICD-10-CM

## 2013-08-27 LAB — BASIC METABOLIC PANEL
BUN: 13 mg/dL (ref 6–23)
CO2: 40 mEq/L (ref 19–32)
Calcium: 9.2 mg/dL (ref 8.4–10.5)
Chloride: 96 mEq/L (ref 96–112)
Creatinine, Ser: 1.35 mg/dL — ABNORMAL HIGH (ref 0.50–1.10)
GFR calc Af Amer: 41 mL/min — ABNORMAL LOW (ref >90)
GFR calc non Af Amer: 36 mL/min — ABNORMAL LOW (ref >90)
Glucose, Bld: 114 mg/dL — ABNORMAL HIGH (ref 70–99)
Potassium: 4 mEq/L (ref 3.7–5.3)
Sodium: 144 mEq/L (ref 137–147)

## 2013-08-27 LAB — CBC
HCT: 32.4 % — ABNORMAL LOW (ref 36.0–46.0)
Hemoglobin: 10 g/dL — ABNORMAL LOW (ref 12.0–15.0)
MCH: 29.7 pg (ref 26.0–34.0)
MCHC: 30.9 g/dL (ref 30.0–36.0)
MCV: 96.1 fL (ref 78.0–100.0)
Platelets: 321 10*3/uL (ref 150–400)
RBC: 3.37 MIL/uL — ABNORMAL LOW (ref 3.87–5.11)
RDW: 18.1 % — ABNORMAL HIGH (ref 11.5–15.5)
WBC: 10.1 10*3/uL (ref 4.0–10.5)

## 2013-08-27 LAB — GLUCOSE, CAPILLARY
Glucose-Capillary: 181 mg/dL — ABNORMAL HIGH (ref 70–99)
Glucose-Capillary: 125 mg/dL — ABNORMAL HIGH (ref 70–99)
Glucose-Capillary: 142 mg/dL — ABNORMAL HIGH (ref 70–99)
Glucose-Capillary: 168 mg/dL — ABNORMAL HIGH (ref 70–99)
Glucose-Capillary: 120 mg/dL — ABNORMAL HIGH (ref 70–99)

## 2013-08-27 LAB — PREALBUMIN: Prealbumin: 11.6 mg/dL — ABNORMAL LOW (ref 17.0–34.0)

## 2013-08-27 MED ORDER — SACCHAROMYCES BOULARDII 250 MG PO CAPS
250.0000 mg | ORAL_CAPSULE | Freq: Two times a day (BID) | ORAL | Status: DC
  Administered 2013-08-27 – 2013-08-29 (×5): 250 mg via ORAL
  Filled 2013-08-27 (×6): qty 1

## 2013-08-27 MED ORDER — DIPHENHYDRAMINE-ZINC ACETATE 2-0.1 % EX CREA
TOPICAL_CREAM | Freq: Three times a day (TID) | CUTANEOUS | Status: DC | PRN
  Administered 2013-08-27: 1 via TOPICAL
  Administered 2013-08-27: 13:00:00 via TOPICAL
  Administered 2013-08-28: 1 via TOPICAL
  Filled 2013-08-27: qty 28

## 2013-08-27 MED ORDER — LOPERAMIDE HCL 2 MG PO CAPS
2.0000 mg | ORAL_CAPSULE | Freq: Three times a day (TID) | ORAL | Status: AC
  Administered 2013-08-27: 2 mg via ORAL
  Filled 2013-08-27: qty 1

## 2013-08-27 MED ORDER — FUROSEMIDE 10 MG/ML IJ SOLN
40.0000 mg | Freq: Two times a day (BID) | INTRAMUSCULAR | Status: DC
  Administered 2013-08-27 (×2): 40 mg via INTRAVENOUS
  Filled 2013-08-27 (×4): qty 4

## 2013-08-27 MED ORDER — DIPHENHYDRAMINE HCL 25 MG PO CAPS
25.0000 mg | ORAL_CAPSULE | Freq: Four times a day (QID) | ORAL | Status: DC | PRN
  Administered 2013-08-27 – 2013-08-29 (×4): 25 mg via ORAL
  Filled 2013-08-27 (×4): qty 1

## 2013-08-27 NOTE — Progress Notes (Signed)
Chaplain saw pt on rounds due to length of stay.  Provided emotional and spiritual support at bedside.  Pt member of "[place]" denomination and is supported by her [employer].  Spoke with chaplain about church going through transitions.  Pt in good spirits and requested prayers with chaplain.   Will continue to follow for support during admission.   [place], [place]

## 2013-08-27 NOTE — Progress Notes (Signed)
CRITICAL VALUE ALERT  Critical value received:  CO2=40  Date of notification:  08/27/13  Time of notification:  04:48   Critical value read back:yes  Nurse who received alert:  Bethann Humble, RN  MD notified (1st page):  Dr. Catha Gosselin  Time of first page:  04:48  MD notified (2nd page):  Time of second page:  Responding MD:  Dr. Jimmy Footman  Time MD responded:  04:48

## 2013-08-27 NOTE — Progress Notes (Signed)
Physical Therapy Treatment Patient Details Name: [name] MRN: [id number] DOB: September 06, 1929 Today's Date: 08/27/2013 Time: [id number] PT Time Calculation (min): 29 min  PT Assessment / Plan / Recommendation  History of Present Illness Admitted 08/12/13 with necrotizing fascitis of perineal area, s/p multiplae I& D's   PT Comments   Pt in bed with RN in room.  Assisted OOB to recliner + 2 assist.  Pt was more able to support self in standing using BeriRW.  Positioned in recliner when spouse entered room.   Follow Up Recommendations  SNF     Does the patient have the potential to tolerate intense rehabilitation     Barriers to Discharge        Equipment Recommendations  None recommended by PT    Recommendations for Other Services    Frequency Min 3X/week   Progress towards PT Goals    Plan      Precautions / Restrictions Precautions Precautions: Fall Precaution Comments: open wound perineum Restrictions Weight Bearing Restrictions: No   Pertinent Vitals/Pain     Mobility  Bed Mobility Overal bed mobility: Needs Assistance Supine to sit: Mod assist;Max assist General bed mobility comments: with use of rails pt was able to assist self from supine to EOB Transfers Overall transfer level: Needs assistance Equipment used: Rolling walker (2 wheeled) Transfers: Sit to/from Stand Sit to Stand: +2 physical assistance;+2 safety/equipment;Max assist;Mod assist General transfer comment: 25% VC's on proper hand placement and tech pt was able to support self and complete 1/4 trun from bed to recliner.  Ambulation/Gait Gait velocity: Pre amb activity of marching 20 reps while standing in fron of recliner before pt c/o fatigue/LE weakness and needed to sit down.       PT Goals (current goals can now be found in the care plan section)    Visit Information  Last PT Received On: 08/27/13 Assistance Needed: +2 History of Present Illness: Admitted 08/12/13 with necrotizing fascitis  of perineal area, s/p multiplae I& D's    Subjective Data      Cognition       Balance     End of Session PT - End of Session Equipment Utilized During Treatment: Gait belt Activity Tolerance: Patient limited by fatigue Patient left: in chair;with call bell/phone within reach   Rica Koyanagi  PTA [facility]  Acute  Rehab Pager      [phone]

## 2013-08-27 NOTE — Progress Notes (Signed)
Removed flexiseal pt was very uncomfortable. Applied 5x5 Allevyn silicone foam dressing over moist stretch gauze to protect from leaking stool.

## 2013-08-27 NOTE — Progress Notes (Signed)
Wound looks clean today.  No stool contamination.

## 2013-08-27 NOTE — Progress Notes (Addendum)
Pt seen, found off bipap on 2lnc.  HR 86, rr27, sats 94%.  Per RN, pt tolerated bipap for about an hour.  Pt stated she is in too much pain, her nerves are "tore up" and refuses to put bipap back on at this time.  RN aware, at bedside.  Bipap remains in room on standby.  RT will continue to monitor pt.

## 2013-08-27 NOTE — Progress Notes (Signed)
7 Days Post-Op  Subjective: They are getting her up to the chair now.  She has ongoing rash all over her body.  She had a couple dressing changes last PM so they don't plan to do it till later this afternoon.  Objective: Vital signs in last 24 hours: Temp:  [98.1 F (36.7 C)-99.4 F (37.4 C)] 98.1 F (36.7 C) (03/17 0400) Pulse Rate:  [73-95] 81 (03/17 0800) Resp:  [18-36] 26 (03/17 0800) BP: (130-171)/(23-127) 130/23 mmHg (03/17 0800) SpO2:  [93 %-100 %] 97 % (03/17 0800) FiO2 (%):  [40 %] 40 % (03/16 2158) Weight:  [115.3 kg (254 lb 3.1 oz)] 115.3 kg (254 lb 3.1 oz) (03/17 0400) Last BM Date: 08/26/13 4 stools recorded yesterday DIII diet TM 99 WBC OK, CO2 40, creatinine is up to 1.35 CXR is a little better, but still has allot of interstitial edema. Intake/Output from previous day: 03/16 0701 - 03/17 0700 In: 1430 [I.V.:1280; IV Piggyback:150] Out: 1565 [Urine:1565] Intake/Output this shift:    General appearance: alert, cooperative, no distress and very weak. Skin: she has a rash all over.  We will look at her perineum later today with dressing change.    This is the posterior portion of the debrided area. The anterior part is also clean and pink . She has a significant issue with rash and chronically wet skin in this area.  The rash covers a large portion of her skin. Lab Results:   Recent Labs  08/26/13 0547 08/27/13 0335  WBC 10.3 10.1  HGB 9.6* 10.0*  HCT 31.6* 32.4*  PLT 317 321    BMET  Recent Labs  08/26/13 0547 08/27/13 0335  NA 145 144  K 4.0 4.0  CL 99 96  CO2 38* 40*  GLUCOSE 114* 114*  BUN 12 13  CREATININE 1.17* 1.35*  CALCIUM 9.3 9.2   PT/INR No results found for this basename: LABPROT, INR,  in the last 72 hours   Recent Labs Lab 08/26/13 0547  AST 19  ALT 10  ALKPHOS 56  BILITOT 0.5  PROT 6.0  ALBUMIN 2.1*     Lipase     Component Value Date/Time   LIPASE 28 12/11/2010 1755     Studies/Results: Dg Chest Port 1  View  08/27/2013   CLINICAL DATA:  Respiratory failure and dyspnea  EXAM: PORTABLE CHEST - 1 VIEW  COMPARISON:  DG CHEST 1V PORT dated 08/26/2013  FINDINGS: The lungs are reasonably well inflated. The interstitial markings remain increased but are slightly less conspicuous today. The hemidiaphragms remain obscured. The cardiopericardial silhouette remains enlarged and the pulmonary vascularity remains engorged. The pulmonary interstitial markings are perhaps slightly less conspicuous today. The left internal jugular venous catheter tip lies in the region of the proximal SVC.  IMPRESSION: There has been slight interval improvement in the appearance of the pulmonary interstitium since yesterday's study suggesting interval decrease in interstitial edema. Considerable abnormality remains however.   Electronically Signed   By: David  Martinique   On: 08/27/2013 07:59   Dg Chest Port 1 View  08/26/2013   CLINICAL DATA:  Respiratory failure.  EXAM: PORTABLE CHEST - 1 VIEW  COMPARISON:  Chest x-ray 08/25/2013.  FINDINGS: There is a left-sided internal jugular central venous catheter with tip terminating in the mid superior vena cava. Lung volumes are low. There is cephalization of the pulmonary vasculature and slight indistinctness of the interstitial markings suggestive of mild pulmonary edema. Small to moderate bilateral pleural effusions. Mild cardiomegaly. The  patient is rotated to the left on today's exam, resulting in distortion of the mediastinal contours and reduced diagnostic sensitivity and specificity for mediastinal pathology. Atherosclerosis in the thoracic aorta.  IMPRESSION: 1. Support apparatus, as above. 2. Low lung volumes with evidence of congestive heart failure, as above.   Electronically Signed   By: Vinnie Langton M.D.   On: 08/26/2013 07:09    Medications: . albuterol  2.5 mg Nebulization Q6H  . anastrozole  1 mg Oral q morning - 10a  . antiseptic oral rinse  15 mL Mouth Rinse QID  . aspirin  EC  325 mg Oral Daily  . chlorhexidine  15 mL Mouth Rinse BID  . enoxaparin (LOVENOX) injection  60 mg Subcutaneous Q24H  . feeding supplement (ENSURE COMPLETE)  237 mL Oral QID  . furosemide  40 mg Intravenous Q12H  . loperamide  2 mg Oral TID  . piperacillin-tazobactam (ZOSYN)  IV  3.375 g Intravenous Q8H  . saccharomyces boulardii  250 mg Oral BID    Assessment/Plan 1. Necrotizing Fasciitis of the perineum  2. S/p Debridement and drainage 08/13/13,TG; 08/15/13, TG; 08/16/13, TG; 08/19/13 AR; 08/20/13 AR, with wound vac placement that could not remain sealed.  3. Sepsis secondary to fasciitis  4. Hypotension/with hx of hypertension  5. Acute respiratory failure/heart failure with elevated Pro"BNP, 10639 on 08/16/13  6. Acute Kidney failure  7. Morbid obesity Body mass index is 51  8. Hx of cardiac disease, rheumatic fever, followed by [facility]  9. Hx of AF/DVT  10. Right breast CA with lumpectomy/radiation Rx/I/D of breast abscess 2013 Dr. Hassell Done  11. Bilateral knee replacements/right hip replacement (using a walker Jan. 2014)  12. Hx of colon perforation during colonoscopy 1990's  13. ERCP/cholecystectomy 2013  14. Hx of stroke  15. Diabetes in 2012 note, but I do not see any place where she has been treated for this.  16. Gout   Plan:  Continue to follow, we will look at the wound later with dressing change.  DAY 15 of Zosyn, 8 days of Vancomycin, stopped on 3/13, 13 days of clindamycin stopped 08/24/13. Physically positioning her to do dressing change is an issue and her rash is an issue also,     LOS: 15 days    Shiri Hodapp 08/27/2013

## 2013-08-27 NOTE — Progress Notes (Signed)
ANTIBIOTIC CONSULT NOTE - Follow Up  Pharmacy Consult for zosyn Indication: necrotizing fasciitis of buttock  Allergies  Allergen Reactions  . Ace Inhibitors Other (See Comments)    Tolerates lisinopril at home. Pt does not recall allergy.  . Atorvastatin     REACTION: myalgias  . Oxycodone-Acetaminophen     REACTION: confusion, fatigue  . Rosuvastatin     REACTION: leg weakness  . Simvastatin     REACTION: leg weakness  . Codeine Nausea And Vomiting and Rash  . Sulfonamide Derivatives Hives and Rash    Patient Measurements: Height: 5\' 2"  (157.5 cm) Weight: 254 lb 3.1 oz (115.3 kg) IBW/kg (Calculated) : 50.1   Vital Signs: Temp: 98.1 F (36.7 C) (03/17 0800) Temp src: Oral (03/17 0800) BP: 130/23 mmHg (03/17 0800) Pulse Rate: 81 (03/17 0800) Intake/Output from previous day: 03/16 0701 - 03/17 0700 In: 1430 [I.V.:1280; IV Piggyback:150] Out: 1565 [Urine:1565] Intake/Output from this shift: Total I/O In: 230 [P.O.:180; IV Piggyback:50] Out: 1950 [Urine:1950]  Labs:  Recent Labs  08/25/13 0445 08/26/13 0547 08/27/13 0335  WBC 9.9 10.3 10.1  HGB 10.2* 9.6* 10.0*  PLT 358 317 321  CREATININE 1.04 1.17* 1.35*  Estimated CrCl ~ 50 mL/min/72 kg (wt-normalized)   Estimated Creatinine Clearance: 39.3 ml/min (by C-G formula based on Cr of 1.35). No results found for this basename: Letta Median, VANCORANDOM, GENTTROUGH, GENTPEAK, GENTRANDOM, TOBRATROUGH, TOBRAPEAK, TOBRARND, AMIKACINPEAK, AMIKACINTROU, AMIKACIN,  in the last 72 hours   Microbiology: 3/2 MRSA PCR: neg 3/2 blood x 2: NGF 3/2 abscess cx: multiple organisms present, no staph/strep; no anaerobes isolated to date. Final. 3/11 Cdiff: neg  Assessment: [name] presented to ED with 3 day history of abscess on L buttock along with generalized weakness.  Pharmacy consulted for Vancomycin and Zosyn management, MD dosing clindamycin for septic shock 2/2 buttock abscess, necrotizing fasciitis of  perineum s/p multiple debridements and drainage.    Day #16 Zosyn.  Afebrile  WBCs: improved to WNL  SCr trending up, CrCl 39   Pt with ongoing rash all over body  Goal of Therapy:  Doses appropriate for renal function  Plan:  Continue Zosyn 3.375gm IV q8h (4hr extended infusions).  F/u duration of therapy.    Ralene Bathe, PharmD, BCPS 08/27/2013, 1:20 PM  Pager: [phone]

## 2013-08-27 NOTE — Progress Notes (Signed)
[facility] PROGRESS NOTE  [name] [id number] DOB: [birth year] DOA: 08/12/2013  PCP: [name], MD  Brief HPI: 80 y/o obese (250 lbs) female admitted 3/2 with R labial necrotizing fasciitis s/p debridement complicated by septic shock and respiratory failure requiring intubation. Extubated on 3/12. Now in diastolic heart failure on Lasix. Also with profuse diarrhea which is c diff negative.   Past medical history:  Past Medical History  Diagnosis Date  . Hypertension   . Hyperlipidemia   . Morbid obesity   . Osteoarthritis   . Gout   . CVA (cerebral infarction)   . IBS (irritable bowel syndrome)   . Diverticulosis   . Esophageal stricture   . Colon polyps     hyperplastic  . Anxiety   . Hiatal hernia     4 cm  . Shingles 02/21/2011  . Skin cancer   . Breast cancer     right breast  . Dysrhythmia     dr bensimhon   . Blood transfusion   . Chronic kidney disease     occ uti's  . GERD (gastroesophageal reflux disease)   . PONV (postoperative nausea and vomiting)   . DVT of leg (deep venous thrombosis)     left; S/P OR  . Atrial flutter     "sometimes"  . Bronchitis   . Pneumonia     "walking"  . Shortness of breath on exertion   . Anemia   . Stroke 2002    residual:  "little tingling in palm of left hand"  . Kidney stones 1990's  . UTI (lower urinary tract infection)     "I've had a few"  . History of radiation therapy 02/15/12-04/02/12    right breast 4680 cGy/26 sessions, right boost=1400cGy /7 sessions    Consultants: General Surgery  SIGNIFICANT EVENTS:  3/03 OR >>> Labia debridement  3/04 Oversedated with Dilaudid and required Narcan x 2  3/06 OR >>> Further debridement  3/08 Debrided at bedside, apneic with minimal sedation (Fentanyl 100 Versed 2)  3/09 Debridment  3/11 Diarrhea  3/14 Increased WOB > NPPV started. Edema pattern on CXR > diuresed. Leslie discussion with husband. Full support including re-intubation if needed. Probably would  not desire long term vent support  3/16 Remains extubated, volume up, ongoing diarrhea despite cessation of TF   STUDIES:  3/3 Echo: EF 55 to 60%, mild LVH, grade 2 diastolic dysfx   LINES / TUBES:  L rad A line 3/3 >>> 3/4  L IJ CVL 3/3 >>  ETT 3/3 >>> 3/12   CULTURES:  3/2 MRSA PCR >>> neg  3/2 Abscess >>> MULTIPLE ORGANISMS  3/2 Blood >>>neg  3/11 C diff >>>neg   ANTIBIOTICS:  Vancomycin 3/2 >>>3/13  Clindamycin 3/2 >> 3/15  Zosyn 3/2 >>   Subjective: Patient feels better. Denies any chest pain. Some improvement in breathing as well. Continues to have diarrhea. Also complains of rash on back with itching.  Objective: Vital Signs  Filed Vitals:   08/27/13 0400 08/27/13 0500 08/27/13 0600 08/27/13 0700  BP: 138/32  141/33   Pulse: 80 80 78 88  Temp: 98.1 F (36.7 C)     TempSrc: Oral     Resp: 28 22 27 21   Height:      Weight: 115.3 kg (254 lb 3.1 oz)     SpO2: 95% 95% 95% 95%    Intake/Output Summary (Last 24 hours) at 08/27/13 0824 Last data filed at 08/27/13 0700  Gross  per 24 hour  Intake   1360 ml  Output   1445 ml  Net    -85 ml   Filed Weights   08/23/13 0400 08/24/13 0352 08/27/13 0400  Weight: 126.4 kg (278 lb 10.6 oz) 126.7 kg (279 lb 5.2 oz) 115.3 kg (254 lb 3.1 oz)    General appearance: alert, cooperative, appears stated age, no distress and moderately obese Head: Normocephalic, without obvious abnormality, atraumatic Resp: slightly tachypneic. Decreased air entry bilateral bases. No wheezing. Crackles present bilaterally. Cardio: regular rate and rhythm, S1, S2 normal, no murmur, click, rub or gallop GI: soft, non-tender; bowel sounds normal; no masses,  no organomegaly Extremities: extremities normal, atraumatic, no cyanosis or edema Skin: maculopapular erythematous rash noted all over mostly over back and LE. Neurologic: Alert and oriented x 3. No focal deficits.  Lab Results:  Basic Metabolic Panel:  Recent Labs Lab 08/23/13 0453  08/24/13 0545 08/25/13 0445 08/26/13 0547 08/27/13 0335  NA 145 145 145 145 144  K 3.4* 3.6* 4.0 4.0 4.0  CL 105 106 102 99 96  CO2 30 31 34* 38* 40*  GLUCOSE 119* 129* 111* 114* 114*  BUN 16 14 12 12 13   CREATININE 0.94 0.99 1.04 1.17* 1.35*  CALCIUM 8.8 8.6 9.0 9.3 9.2  MG 1.6  --   --   --   --    Liver Function Tests:  Recent Labs Lab 08/26/13 0547  AST 19  ALT 10  ALKPHOS 56  BILITOT 0.5  PROT 6.0  ALBUMIN 2.1*   CBC:  Recent Labs Lab 08/23/13 0453 08/24/13 0545 08/25/13 0445 08/26/13 0547 08/27/13 0335  WBC 8.9 8.1 9.9 10.3 10.1  HGB 9.9* 9.1* 10.2* 9.6* 10.0*  HCT 32.0* 29.5* 32.0* 31.6* 32.4*  MCV 94.4 94.9 93.6 95.5 96.1  PLT 292 302 358 317 321   BNP (last 3 results)  Recent Labs  08/17/13 0528 08/26/13 0547  PROBNP 10639.0* 9195.0*   CBG:  Recent Labs Lab 08/25/13 2144 08/26/13 0732 08/26/13 1205 08/26/13 2143 08/27/13 0752  GLUCAP 152* 115* 125* 125* 142*    Recent Results (from the past 240 hour(s))  CLOSTRIDIUM DIFFICILE BY PCR     Status: None   Collection Time    08/21/13 12:10 PM      Result Value Ref Range Status   C difficile by pcr NEGATIVE  NEGATIVE Final   Comment: Performed at [facility]      Studies/Results: Dg Chest Port 1 View  08/27/2013   CLINICAL DATA:  Respiratory failure and dyspnea  EXAM: PORTABLE CHEST - 1 VIEW  COMPARISON:  DG CHEST 1V PORT dated 08/26/2013  FINDINGS: The lungs are reasonably well inflated. The interstitial markings remain increased but are slightly less conspicuous today. The hemidiaphragms remain obscured. The cardiopericardial silhouette remains enlarged and the pulmonary vascularity remains engorged. The pulmonary interstitial markings are perhaps slightly less conspicuous today. The left internal jugular venous catheter tip lies in the region of the proximal SVC.  IMPRESSION: There has been slight interval improvement in the appearance of the pulmonary interstitium since  yesterday's study suggesting interval decrease in interstitial edema. Considerable abnormality remains however.   Electronically Signed   By: David  Martinique   On: 08/27/2013 07:59   Dg Chest Port 1 View  08/26/2013   CLINICAL DATA:  Respiratory failure.  EXAM: PORTABLE CHEST - 1 VIEW  COMPARISON:  Chest x-ray 08/25/2013.  FINDINGS: There is a left-sided internal jugular central venous catheter with tip  terminating in the mid superior vena cava. Lung volumes are low. There is cephalization of the pulmonary vasculature and slight indistinctness of the interstitial markings suggestive of mild pulmonary edema. Small to moderate bilateral pleural effusions. Mild cardiomegaly. The patient is rotated to the left on today's exam, resulting in distortion of the mediastinal contours and reduced diagnostic sensitivity and specificity for mediastinal pathology. Atherosclerosis in the thoracic aorta.  IMPRESSION: 1. Support apparatus, as above. 2. Low lung volumes with evidence of congestive heart failure, as above.   Electronically Signed   By: Vinnie Langton M.D.   On: 08/26/2013 07:09    Medications:  Scheduled: . albuterol  2.5 mg Nebulization Q6H  . anastrozole  1 mg Oral q morning - 10a  . antiseptic oral rinse  15 mL Mouth Rinse QID  . aspirin EC  325 mg Oral Daily  . chlorhexidine  15 mL Mouth Rinse BID  . enoxaparin (LOVENOX) injection  60 mg Subcutaneous Q24H  . feeding supplement (ENSURE COMPLETE)  237 mL Oral QID  . furosemide  40 mg Intravenous Q12H  . loperamide  2 mg Oral TID  . piperacillin-tazobactam (ZOSYN)  IV  3.375 g Intravenous Q8H  . saccharomyces boulardii  250 mg Oral BID   Continuous: . sodium chloride 20 mL/hr at 08/24/13 1800   [id number] (TYLENOL) oral liquid 160 mg/5 mL, albuterol, bisacodyl, diphenhydrAMINE, fentaNYL, hydrALAZINE, ondansetron (ZOFRAN) IV, polyethylene glycol, polyvinyl alcohol  Assessment/Plan:  Principal Problem:   Necrotizing fasciitis of  perineum Active Problems:   DIABETES MELLITUS, TYPE II   HYPERLIPIDEMIA   GOUT   ANXIETY   HYPERTENSION   Morbid obesity   Breast cancer, right breast-lobular s/p lumpectomy and sentinel node biopsy   Wheezing   Leukocytosis   Acute on chronic kidney disease, stage 3   Hypotension, unspecified   Septic shock(785.52)   Severe sepsis with septic shock   Acute respiratory failure   Sepsis   Pulmonary edema   Acute respiratory failure due to Pulmonary edema  Continue diuresis for now. CXR from today continues to show pulmonary edema. Stop IVF. Monitor I/O. Bipap prn.  Necrotizing Fascitis Perineum Status post multiple debridement by general surgery. On Zosyn. Surgery is following.   Acute diastolic CHF ECHO report reviewed. Continue Lasix. Strict I/O. CXR report reviewed as well.  Septic Shock Due to perineal infection. Now resolved. BP stable.  Acute Diarrhea C diff negative. Imodium. Add Florastor. Send stool for WBC and culture. Stop all laxatives and stool softeners.  Rash Recent onset. No new medications except perhaps lasix. She is allergic to Sulfa. Try benadryl. Monitor for now.  History of HTN Holding home medications.   AKI Resolved. Admitted with creatinine of 1.8. Over past 2 days creatinine slightly higher, possibly due to diuresis. Monitor for now.  Dysphagia Swallow eval completed and on Dys diet.  History of Breast Cancer On Anistrazole  Anemia likely sec to Chronic Disease Stable.  Code Status: Full Code  DVT Prophylaxis: Lovenox    Family Communication: No family at bedside. Husband not in yet.  Disposition Plan: Will need to go to SNF on discharge due to severe deconditioning. PT following. Mobilize as tolerated.    LOS: 15 days   [facility] Pager [phone] 08/27/2013, 8:24 AM  If 8PM-8AM, please contact night-coverage at [email or web address], password [facility]

## 2013-08-28 DIAGNOSIS — I959 Hypotension, unspecified: Secondary | ICD-10-CM

## 2013-08-28 LAB — BASIC METABOLIC PANEL WITH GFR
BUN: 16 mg/dL (ref 6–23)
CO2: 42 meq/L (ref 19–32)
Calcium: 9.2 mg/dL (ref 8.4–10.5)
Chloride: 94 meq/L — ABNORMAL LOW (ref 96–112)
Creatinine, Ser: 1.57 mg/dL — ABNORMAL HIGH (ref 0.50–1.10)
GFR calc Af Amer: 35 mL/min — ABNORMAL LOW
GFR calc non Af Amer: 30 mL/min — ABNORMAL LOW
Glucose, Bld: 93 mg/dL (ref 70–99)
Potassium: 3.9 meq/L (ref 3.7–5.3)
Sodium: 143 meq/L (ref 137–147)

## 2013-08-28 LAB — CBC
HCT: 33.1 % — ABNORMAL LOW (ref 36.0–46.0)
Hemoglobin: 10.1 g/dL — ABNORMAL LOW (ref 12.0–15.0)
MCH: 29.8 pg (ref 26.0–34.0)
MCHC: 30.5 g/dL (ref 30.0–36.0)
MCV: 97.6 fL (ref 78.0–100.0)
Platelets: 292 10*3/uL (ref 150–400)
RBC: 3.39 MIL/uL — ABNORMAL LOW (ref 3.87–5.11)
RDW: 18 % — ABNORMAL HIGH (ref 11.5–15.5)
WBC: 10.5 10*3/uL (ref 4.0–10.5)

## 2013-08-28 LAB — GLUCOSE, CAPILLARY: Glucose-Capillary: 86 mg/dL (ref 70–99)

## 2013-08-28 MED ORDER — HYDROCORTISONE 1 % EX CREA: TOPICAL_CREAM | Freq: Two times a day (BID) | CUTANEOUS | Status: DC

## 2013-08-28 MED ORDER — FUROSEMIDE 10 MG/ML IJ SOLN
40.0000 mg | Freq: Every day | INTRAMUSCULAR | Status: DC
  Administered 2013-08-28 – 2013-08-29 (×2): 40 mg via INTRAVENOUS
  Filled 2013-08-28 (×2): qty 4

## 2013-08-28 MED ORDER — SACCHAROMYCES BOULARDII 250 MG PO CAPS
250.0000 mg | ORAL_CAPSULE | Freq: Two times a day (BID) | ORAL | Status: DC
Start: 2013-08-28 — End: 2013-10-10

## 2013-08-28 MED ORDER — ONDANSETRON HCL 4 MG/2ML IJ SOLN: 4.0000 mg | Freq: Four times a day (QID) | INTRAMUSCULAR | Status: DC | PRN

## 2013-08-28 MED ORDER — ALBUTEROL SULFATE (2.5 MG/3ML) 0.083% IN NEBU: 2.5000 mg | INHALATION_SOLUTION | Freq: Four times a day (QID) | RESPIRATORY_TRACT | Status: DC

## 2013-08-28 MED ORDER — HYDROCORTISONE 1 % EX CREA
TOPICAL_CREAM | Freq: Two times a day (BID) | CUTANEOUS | Status: DC
  Administered 2013-08-28 – 2013-08-29 (×3): via TOPICAL
  Filled 2013-08-28 (×2): qty 28

## 2013-08-28 NOTE — Progress Notes (Addendum)
[facility] PROGRESS NOTE  [name] [id number] DOB: 1929/07/26 DOA: 08/12/2013 PCP: [name], MD  Assessment/Plan:  Principal Problem:  Necrotizing fasciitis of perineum  Active Problems:  DIABETES MELLITUS, TYPE II  HYPERLIPIDEMIA  GOUT  ANXIETY  HYPERTENSION  Morbid obesity  Breast cancer, right breast-lobular s/p lumpectomy and sentinel node biopsy  Wheezing  Leukocytosis  Acute on chronic kidney disease, stage 3  Hypotension, unspecified  Septic shock(785.52)  Severe sepsis with septic shock  Acute respiratory failure  Sepsis  Pulmonary edema  80 y/o obese (250 lbs) female admitted 3/2 with R labial necrotizing fasciitis s/p debridement complicated by septic shock and respiratory failure requiring intubation. Extubated on 3/12. Now in diastolic heart failure on Lasix. Also with profuse diarrhea which is c diff negative(improving)   1. Acute respiratory failure due to Pulmonary edema; echo: LVEF [id number], grade 2 diastolic dysfunction  -Pt was on lasix IV 40 BID; decrease to daily due to worsening renal function; Bipap prn.  2. Necrotizing Fascitis Perineum  -s/p multiple debridement by general surgery. Surgery is following. competed atx  3. Acute diastolic CHF; echo: LVEF [id number], grade 2 diastolic dysfunction  -cont Lasix titrate per renal function, clinical response; Strict I/O. 4. Septic Shock  -due to perineal infection. Now resolved. BP stable.  5. Acute Diarrhea  C diff negative. Imodium. Add Florastor. Send stool for WBC and culture. Stop all laxatives and stool softeners.  6. Rash  -recent onset. No new medications except perhaps lasix. She is allergic to Sulfa. Try benadryl. Monitor for now.  7. History of HTN  -Holding home medications.  8. AKI Monitor on diuresis 9. Dysphagia  -swallow eval completed and on Dys diet.  10. History of Breast Cancer  -on Anistrazole  11. Anemia likely sec to Chronic Disease stable.  C/s SW ? [facility] eval for wound  care   Code Status: full Family Communication: d/w patient, Teri, [place] [phone]  (indicate person spoken with, relationship, and if by phone, the number) Disposition Plan: pend clincial improvement; SNF VS [facility]   SIGNIFICANT EVENTS:  3/03 OR >>> Labia debridement  3/04 Oversedated with Dilaudid and required Narcan x 2  3/06 OR >>> Further debridement  3/08 Debrided at bedside, apneic with minimal sedation (Fentanyl 100 Versed 2)  3/09 Debridment  3/11 Diarrhea  3/14 Increased WOB > NPPV started. Edema pattern on CXR > diuresed. [place] discussion with husband. Full support including re-intubation if needed. Probably would not desire long term vent support  3/16 Remains extubated, volume up, ongoing diarrhea despite cessation of TF  STUDIES:  3/3 Echo: EF 55 to 60%, mild LVH, grade 2 diastolic dysfx  LINES / TUBES:  L rad A line 3/3 >>> 3/4  L IJ CVL 3/3 >>  ETT 3/3 >>> 3/12  CULTURES:  3/2 MRSA PCR >>> neg  3/2 Abscess >>> MULTIPLE ORGANISMS  3/2 Blood >>>neg  3/11 C diff >>>neg  ANTIBIOTICS:  Vancomycin 3/2 >>>3/13  Clindamycin 3/2 >> 3/15  Zosyn 3/2 >> 3/18    HPI/Subjective: alert  Objective: Filed Vitals:   08/28/13 0535  BP: 146/31  Pulse: 78  Temp:   Resp: 25    Intake/Output Summary (Last 24 hours) at 08/28/13 0800 Last data filed at 08/28/13 0542  Gross per 24 hour  Intake    775 ml  Output   3205 ml  Net  -2430 ml   Filed Weights   08/23/13 0400 08/24/13 0352 08/27/13 0400  Weight: 126.4 kg (278  lb 10.6 oz) 126.7 kg (279 lb 5.2 oz) 115.3 kg (254 lb 3.1 oz)    Exam:   General:  alert  Cardiovascular: s1,s2 rrr  Respiratory: LL crackles   Abdomen: soft, nt,nd   Musculoskeletal: no LE edema   Data Reviewed: Basic Metabolic Panel:  Recent Labs Lab 08/23/13 0453 08/24/13 0545 08/25/13 0445 08/26/13 0547 08/27/13 0335 08/28/13 0620  NA 145 145 145 145 144 143  K 3.4* 3.6* 4.0 4.0 4.0 3.9  CL 105 106 102 99 96 94*   CO2 30 31 34* 38* 40* 42*  GLUCOSE 119* 129* 111* 114* 114* 93  BUN 16 14 12 12 13 16   CREATININE 0.94 0.99 1.04 1.17* 1.35* 1.57*  CALCIUM 8.8 8.6 9.0 9.3 9.2 9.2  MG 1.6  --   --   --   --   --    Liver Function Tests:  Recent Labs Lab 08/26/13 0547  AST 19  ALT 10  ALKPHOS 56  BILITOT 0.5  PROT 6.0  ALBUMIN 2.1*   No results found for this basename: LIPASE, AMYLASE,  in the last 168 hours No results found for this basename: AMMONIA,  in the last 168 hours CBC:  Recent Labs Lab 08/24/13 0545 08/25/13 0445 08/26/13 0547 08/27/13 0335 08/28/13 0620  WBC 8.1 9.9 10.3 10.1 10.5  HGB 9.1* 10.2* 9.6* 10.0* 10.1*  HCT 29.5* 32.0* 31.6* 32.4* 33.1*  MCV 94.9 93.6 95.5 96.1 97.6  PLT 302 358 317 321 292   Cardiac Enzymes: No results found for this basename: CKTOTAL, CKMB, CKMBINDEX, TROPONINI,  in the last 168 hours BNP (last 3 results)  Recent Labs  08/17/13 0528 08/26/13 0547  PROBNP 10639.0* 9195.0*   CBG:  Recent Labs Lab 08/26/13 1605 08/26/13 2143 08/27/13 0752 08/27/13 1211 08/27/13 1702  GLUCAP 181* 125* 142* 168* 120*    Recent Results (from the past 240 hour(s))  CLOSTRIDIUM DIFFICILE BY PCR     Status: None   Collection Time    08/21/13 12:10 PM      Result Value Ref Range Status   C difficile by pcr NEGATIVE  NEGATIVE Final   Comment: Performed at [facility]     Studies: Dg Chest Port 1 View  08/27/2013   CLINICAL DATA:  Respiratory failure and dyspnea  EXAM: PORTABLE CHEST - 1 VIEW  COMPARISON:  DG CHEST 1V PORT dated 08/26/2013  FINDINGS: The lungs are reasonably well inflated. The interstitial markings remain increased but are slightly less conspicuous today. The hemidiaphragms remain obscured. The cardiopericardial silhouette remains enlarged and the pulmonary vascularity remains engorged. The pulmonary interstitial markings are perhaps slightly less conspicuous today. The left internal jugular venous catheter tip lies in the  region of the proximal SVC.  IMPRESSION: There has been slight interval improvement in the appearance of the pulmonary interstitium since yesterday's study suggesting interval decrease in interstitial edema. Considerable abnormality remains however.   Electronically Signed   By: David  [place]   On: 08/27/2013 07:59    Scheduled Meds: . albuterol  2.5 mg Nebulization Q6H  . anastrozole  1 mg Oral q morning - 10a  . antiseptic oral rinse  15 mL Mouth Rinse QID  . aspirin EC  325 mg Oral Daily  . chlorhexidine  15 mL Mouth Rinse BID  . enoxaparin (LOVENOX) injection  60 mg Subcutaneous Q24H  . feeding supplement (ENSURE COMPLETE)  237 mL Oral QID  . furosemide  40 mg Intravenous Q12H  . loperamide  2 mg Oral TID  . piperacillin-tazobactam (ZOSYN)  IV  3.375 g Intravenous Q8H  . saccharomyces boulardii  250 mg Oral BID   Continuous Infusions: . sodium chloride 20 mL/hr at 08/24/13 1800    Principal Problem:   Necrotizing fasciitis of perineum Active Problems:   DIABETES MELLITUS, TYPE II   HYPERLIPIDEMIA   GOUT   ANXIETY   HYPERTENSION   Morbid obesity   Breast cancer, right breast-lobular s/p lumpectomy and sentinel node biopsy   Wheezing   Leukocytosis   Acute on chronic kidney disease, stage 3   Hypotension, unspecified   Septic shock(785.52)   Severe sepsis with septic shock   Acute respiratory failure   Sepsis   Pulmonary edema   Acute diarrhea   Rash and nonspecific skin eruption    Time spent: >35 minutes     Kinnie Feil  [facility] Pager [phone]. If 7PM-7AM, please contact night-coverage at [email or web address], password [facility] 08/28/2013, 8:00 AM  LOS: 16 days

## 2013-08-28 NOTE — Discharge Summary (Addendum)
Physician Discharge Summary  [name] [id number] DOB: 12/30/1929 DOA: 08/12/2013  PCP: Marie Cower, MD  Admit date: 08/12/2013  Discharge date: 08/29/2013  Time spent: >35 minutes  Recommendations for Outpatient Follow-up:  F/u with surgery in 1-2 weeks to revaluate the wound Wound care   Discharge Diagnoses:  Principal Problem:   Necrotizing fasciitis of perineum Active Problems:   DIABETES MELLITUS, TYPE II   HYPERLIPIDEMIA   GOUT   ANXIETY   HYPERTENSION   Morbid obesity   Breast cancer, right breast-lobular s/p lumpectomy and sentinel node biopsy   Wheezing   Leukocytosis   Acute on chronic kidney disease, stage 3   Hypotension, unspecified   Septic shock(785.52)   Severe sepsis with septic shock   Acute respiratory failure   Sepsis   Pulmonary edema   Acute diarrhea   Rash and nonspecific skin eruption   Discharge Condition: stable   Diet recommendation: hearthealthy   Filed Weights   08/23/13 0400 08/24/13 0352 08/27/13 0400  Weight: 126.4 kg (278 lb 10.6 oz) 126.7 kg (279 lb 5.2 oz) 115.3 kg (254 lb 3.1 oz)    History of present illness:  Principal Problem:  Necrotizing fasciitis of perineum  Active Problems:  DIABETES MELLITUS, TYPE II  HYPERLIPIDEMIA  GOUT  ANXIETY  HYPERTENSION  Morbid obesity  Breast cancer, right breast-lobular s/p lumpectomy and sentinel node biopsy  Wheezing  Leukocytosis  Acute on chronic kidney disease, stage 3  Hypotension, unspecified  Septic shock(785.52)  Severe sepsis with septic shock  Acute respiratory failure  Sepsis  Pulmonary edema   80 y/o obese (250 lbs) female admitted 3/2 with R labial necrotizing fasciitis s/p debridement complicated by septic shock and respiratory failure requiring intubation. Extubated on 3/12. Now in diastolic heart failure on Lasix. Also with profuse diarrhea which is c diff negative(improving)    Hospital Course: 1. Acute respiratory failure due to Pulmonary edema; echo:  LVEF [id number], grade 2 diastolic dysfunction  -improving; Pt responded to lasix IV 40 BID; changed to PO lasix; cont diuresis; f/u labs in 2-3 days.  2. Necrotizing Fascitis Perineum  -s/p multiple debridement by general surgery. Surgery is following. competed atx  -cont wound care  3. Acute diastolic CHF; echo: LVEF [id number], grade 2 diastolic dysfunction  -cont Lasix PO titrate per renal function, clinical response; Strict I/O.  4. Septic Shock  -due to perineal infection. Now resolved. BP stable.  5. Acute Diarrhea  C diff negative. Imodium. Add Florastor. Send stool for WBC and culture. Stop all laxatives and stool softeners.  6. Rash  -recent onset. No new medications except perhaps lasix. She is allergic to Sulfa. Short course of prednisone; Monitor for now.  7. History of HTN stable off meds; hold ACE due to  AKI  8. AKI. Stable Cr;  Monitor on diuresis  9. Dysphagia  -swallow eval completed and on Dys diet.  10. History of Breast Cancer  -on Anistrazole  11. Anemia likely sec to Chronic Disease stable.    Procedures:  I&D (i.e. Studies not automatically included, echos, thoracentesis, etc; not x-rays)  Consultations:  Surgery   Discharge Exam: Filed Vitals:   08/28/13 1253  BP: 145/22  Pulse: 92  Temp:   Resp: 22    General: alert Cardiovascular: s1,s2 rrr Respiratory: CTA BL  Discharge Instructions  Discharge Orders   Future Appointments Provider Department Dept Phone   02/18/2014 2:45 PM [facility] [phone]   02/18/2014 3:15 PM  [name], [facility] [phone]   Future Orders Complete By Expires   Diet - low sodium heart healthy  As directed    Discharge instructions  As directed    Comments:     Please follow up with surgery in 1-2 week   Increase activity slowly  As directed        Medication List    STOP taking these medications       lisinopril 20 MG tablet   Commonly known as:  PRINIVIL,ZESTRIL      TAKE these medications       albuterol (2.5 MG/3ML) 0.083% nebulizer solution  Commonly known as:  PROVENTIL  Take 3 mLs (2.5 mg total) by nebulization every 6 (six) hours.     allopurinol 100 MG tablet  Commonly known as:  ZYLOPRIM  Take 100 mg by mouth every other day.     anastrozole 1 MG tablet  Commonly known as:  ARIMIDEX  Take 1 mg by mouth every morning.     aspirin EC 81 MG tablet  Take 243 mg by mouth every other day.     aspirin 325 MG EC tablet  Take 325 mg by mouth every other day.     cholecalciferol 1000 UNITS tablet  Commonly known as:  VITAMIN D  Take 1,000 Units by mouth every morning.     famotidine 20 MG tablet  Commonly known as:  PEPCID  Take 1 tablet (20 mg total) by mouth daily.     furosemide 40 MG tablet  Commonly known as:  LASIX  Take 40 mg by mouth every morning.     hydrocortisone cream 1 %  Apply topically 2 (two) times daily.     polyvinyl alcohol 1.4 % ophthalmic solution  Commonly known as:  LIQUIFILM TEARS  Place 2 drops into both eyes daily as needed for dry eyes.     predniSONE 20 MG tablet  Commonly known as:  DELTASONE  Take 1.5 tablets (30 mg total) by mouth daily with breakfast.     saccharomyces boulardii 250 MG capsule  Commonly known as:  FLORASTOR  Take 1 capsule (250 mg total) by mouth 2 (two) times daily.     traMADol-acetaminophen 37.5-325 MG per tablet  Commonly known as:  ULTRACET  Take 1 tablet by mouth every 6 (six) hours as needed for pain.       Allergies  Allergen Reactions  . Ace Inhibitors Other (See Comments)    Tolerates lisinopril at home. Pt does not recall allergy.  . Atorvastatin     REACTION: myalgias  . Oxycodone-Acetaminophen     REACTION: confusion, fatigue  . Rosuvastatin     REACTION: leg weakness  . Simvastatin     REACTION: leg weakness  . Codeine Nausea And Vomiting and Rash  . Sulfonamide Derivatives Hives and Rash      The  results of significant diagnostics from this hospitalization (including imaging, microbiology, ancillary and laboratory) are listed below for reference.    Significant Diagnostic Studies: Dg Chest Port 1 View  08/27/2013   CLINICAL DATA:  Respiratory failure and dyspnea  EXAM: PORTABLE CHEST - 1 VIEW  COMPARISON:  DG CHEST 1V PORT dated 08/26/2013  FINDINGS: The lungs are reasonably well inflated. The interstitial markings remain increased but are slightly less conspicuous today. The hemidiaphragms remain obscured. The cardiopericardial silhouette remains enlarged and the pulmonary vascularity remains engorged. The pulmonary interstitial markings are perhaps slightly less conspicuous  today. The left internal jugular venous catheter tip lies in the region of the proximal SVC.  IMPRESSION: There has been slight interval improvement in the appearance of the pulmonary interstitium since yesterday's study suggesting interval decrease in interstitial edema. Considerable abnormality remains however.   Electronically Signed   By: David  Martinique   On: 08/27/2013 07:59   Dg Chest Port 1 View  08/26/2013   CLINICAL DATA:  Respiratory failure.  EXAM: PORTABLE CHEST - 1 VIEW  COMPARISON:  Chest x-ray 08/25/2013.  FINDINGS: There is a left-sided internal jugular central venous catheter with tip terminating in the mid superior vena cava. Lung volumes are low. There is cephalization of the pulmonary vasculature and slight indistinctness of the interstitial markings suggestive of mild pulmonary edema. Small to moderate bilateral pleural effusions. Mild cardiomegaly. The patient is rotated to the left on today's exam, resulting in distortion of the mediastinal contours and reduced diagnostic sensitivity and specificity for mediastinal pathology. Atherosclerosis in the thoracic aorta.  IMPRESSION: 1. Support apparatus, as above. 2. Low lung volumes with evidence of congestive heart failure, as above.   Electronically Signed   By:  Vinnie Langton M.D.   On: 08/26/2013 07:09   Dg Chest Port 1 View  08/25/2013   CLINICAL DATA:  Respiratory failure  EXAM: PORTABLE CHEST - 1 VIEW  COMPARISON:  08/24/2013  FINDINGS: Left jugular central venous line is again seen and stable. Cardiac shadow is stable. Bilateral pleural effusions are noted as well as a degree of vascular congestion the overall appearance is stable from the prior study.  IMPRESSION: No significant interval change from the prior exam.   Electronically Signed   By: Inez Catalina M.D.   On: 08/25/2013 07:32   Dg Chest Port 1 View  08/24/2013   CLINICAL DATA:  Followup atelectasis  EXAM: PORTABLE CHEST - 1 VIEW  COMPARISON:  08/23/2013  FINDINGS: Progression of right lower lobe airspace consolidation. There is a moderate right effusion.  Left lower lobe consolidation and small left effusion are similar.  There is vascular congestion with probable mild edema  Left jugular catheter tip in the SVC.  IMPRESSION: Findings consistent with congestive heart failure with edema and bilateral effusions.  Progression of right lower lobe consolidation which may be atelectasis or infiltrate.   Electronically Signed   By: Franchot Gallo M.D.   On: 08/24/2013 06:43   Dg Chest Port 1 View  08/23/2013   CLINICAL DATA:  Extubation.  EXAM: PORTABLE CHEST - 1 VIEW  COMPARISON:  DG CHEST 1V PORT dated 08/22/2013; DG CHEST 1V PORT dated 08/21/2013  FINDINGS: Interim extubation. Left IJ line in stable position. Interim removal of NG tube. Progressive right lower lobe atelectasis is present. Cardiomegaly with pulmonary vascular prominence and interstitial prominence with bilateral pleural effusions. Findings consistent congestive heart failure. No pneumothorax.  IMPRESSION: 1. Interim extubation and removal of NG tube. Left IJ line in stable position. 2. Severe right lower lobe atelectasis and consolidation. 3. Congestive heart failure with pulmonary interstitial edema and bilateral pleural effusions.    Electronically Signed   By: Lykens   On: 08/23/2013 08:00   Dg Chest Port 1 View  08/22/2013   CLINICAL DATA Evaluate infiltrates.  EXAM PORTABLE CHEST - 1 VIEW  COMPARISON DG CHEST 1V PORT dated 08/21/2013  FINDINGS Endotracheal tube is roughly 2.7 cm above the carina. Nasogastric tube extends into the abdomen. Patchy interstitial densities bilaterally have not changed. Persistent densities at both lung bases are  concerning for areas of consolidation and pleural fluid. Heart appears to be upper limits of normal for size. Patient is rotated towards the right on the study. There is a left jugular central venous catheter near the upper SVC but poorly evaluated. Negative for a pneumothorax.  IMPRESSION Diffuse interstitial densities are suggestive for pulmonary edema. Suspect bilateral pleural effusions with basilar consolidation or atelectasis.  Support apparatuses as described.  SIGNATURE  Electronically Signed   By: Markus Daft M.D.   On: 08/22/2013 06:54   Dg Chest Port 1 View  08/21/2013   CLINICAL DATA Follow-up atelectasis.  EXAM PORTABLE CHEST - 1 VIEW  COMPARISON DG CHEST 1V PORT dated 08/20/2013  FINDINGS The cardiac silhouette appears mild to moderately enlarged, similar. Mediastinal silhouette is nonsuspicious, mildly calcified aortic knob. Similar mild interstitial prominence, with bibasilar airspace opacities and small pleural effusion, similar. Strandy midlung zone densities likely reflect atelectasis. No pneumothorax.  Endotracheal tube tip projects 2.4 cm above the carina. Left internal jugular central venous catheter with distal tip approximating the proximal superior vena cava. Nasogastric tube past the GE junction the distal tip not imaged. Multiple EKG lines overlie the patient and may obscure subtle underlying pathology.  IMPRESSION No apparent change in life-support lines.  Stable cardiomegaly, interstitial prominence likely reflects pulmonary edema with bibasilar airspace opacities,  midlung zone atelectasis and small pleural effusions, similar.  SIGNATURE  Electronically Signed   By: Elon Alas   On: 08/21/2013 06:55   Dg Chest Port 1 View  08/20/2013   CLINICAL DATA:  Hypoxia  EXAM: PORTABLE CHEST - 1 VIEW  COMPARISON:  Study obtained earlier in the day  FINDINGS: Endotracheal tube tip is 2.4 cm above the carina. Nasogastric tube in side-port extend below the diaphragm. Central catheter tip is in the superior vena cava just beyond the junction with the left innominate vein. No pneumothorax.  There is cardiomegaly with bilateral effusions and patchy left and to a lesser extent right mid lung atelectatic change. There is no new opacity.  IMPRESSION: Tube and catheter positions as described without pneumothorax. Evidence of a degree of underlying congestive heart failure. Patchy mid lung atelectatic change bilaterally, stable.   Electronically Signed   By: Lowella Grip M.D.   On: 08/20/2013 07:58   Dg Chest Port 1 View  08/20/2013   CLINICAL DATA:  Evaluate lung fields.  EXAM: PORTABLE CHEST - 1 VIEW  COMPARISON:  DG CHEST 1V PORT dated 08/18/2013  FINDINGS: Endotracheal tube and enteric tube appear unchanged. The left IJ central line has been advanced and the redundant loop in the neck is no longer visualized. The tip of the left IJ central line is at the confluence of the brachiocephalic veins. Bilateral pleural effusions, left-greater-than-right basilar predominant airspace disease and basilar predominant atelectasis is unchanged compared to prior. Cardiopericardial silhouette unchanged. Patient is rotated to the right.  IMPRESSION: 1. Adjustment of support apparatus detailed above. 2. Unchanged appearance of the heart and lungs.   Electronically Signed   By: Dereck Ligas M.D.   On: 08/20/2013 07:48   Dg Chest Port 1 View  08/18/2013   CLINICAL DATA:  Endotracheal tube placement  EXAM: PORTABLE CHEST - 1 VIEW  COMPARISON:  Prior radiograph from 08/17/2013  FINDINGS: Tip of  the endotracheal tube is positioned approximately 1.7 cm above the carina. Enteric tube courses into the abdomen. Left IJ approach central venous catheter is stable in position. The catheter is coiled near its insertion site in the lower left  neck.  Cardiomegaly is stable. There has been slight interval worsening in diffuse pulmonary vascular congestion with indistinctness of the interstitial markings, suggestive of progressive edema. Small moderate bilateral pleural effusions with associated bibasilar opacities are similar. No pneumothorax. No definite new focal infiltrate.  Osseous structures are unchanged.  IMPRESSION: 1. Tip of the endotracheal tube 1.7 cm above the carina. 2. Slight interval worsening in diffuse pulmonary vascular congestion with indistinctness of the interstitial markings, suggesting worsened edema. 3. Similar bilateral pleural effusions with associated bibasilar opacities, likely atelectasis.   Electronically Signed   By: Jeannine Boga M.D.   On: 08/18/2013 06:18   Dg Chest Port 1 View  08/17/2013   CLINICAL DATA:  Intubation  EXAM: PORTABLE CHEST - 1 VIEW  COMPARISON:  Prior radiograph from 08/15/2013  FINDINGS: Tip of the endotracheal tube is positioned approximately 2 cm above the carina. Left IJ approach system venous catheter is gross stable. Enteric tube courses into the abdomen. Cardiomegaly is unchanged.  Bilateral pleural effusions with associated bibasilar opacities are not significantly changed. Diffuse pulmonary vascular congestion without frank pulmonary edema is similar. No pneumothorax.  Osseous structures are unchanged.  IMPRESSION: 1. Tip of the endotracheal tube 2 cm above the carina. Otherwise stable support apparatus. 2. Small bilateral pleural effusions with associated bibasilar opacity, which may reflect atelectasis or infiltrate. 3. Stable cardiomegaly with diffuse pulmonary vascular congestion without frank pulmonary edema, similar to prior.   Electronically  Signed   By: Jeannine Boga M.D.   On: 08/17/2013 06:36   Dg Chest Port 1 View  08/15/2013   CLINICAL DATA:  Respiratory distress  EXAM: PORTABLE CHEST - 1 VIEW  COMPARISON:  08/14/2013  FINDINGS: Endotracheal tube is 1 cm above the carina. NG tube enters the stomach. Central venous catheter tip in the SVC is unchanged.  Bibasilar consolidation unchanged. Small bilateral effusions unchanged. Pulmonary vascular congestion unchanged  IMPRESSION: Endotracheal tube is just above the carina.  Bibasilar consolidation and small effusions unchanged. Pulmonary vascular congestion unchanged.   Electronically Signed   By: Franchot Gallo M.D.   On: 08/15/2013 08:38   Dg Chest Port 1 View  08/14/2013   CLINICAL DATA:  Post intubation.  EXAM: PORTABLE CHEST - 1 VIEW  COMPARISON:  08/14/2013  FINDINGS: Endotracheal tube is roughly 2.1 cm above the carina. Again noted is enlargement of the cardiac silhouette. Enlarged interstitial lung markings are suggestive for edema. There are bibasilar densities which could represent a combination atelectasis and pleural fluid. Left jugular central venous catheter tip is near the central left innominate vein.  IMPRESSION: Endotracheal tube is appropriately positioned above the carina.  Cardiomegaly with evidence for interstitial edema.  Bibasilar densities as described.   Electronically Signed   By: Markus Daft M.D.   On: 08/14/2013 09:46   Dg Chest Port 1 View  08/14/2013   CLINICAL DATA:  Increasing shortness of breath.  EXAM: PORTABLE CHEST - 1 VIEW  COMPARISON:  Chest x-ray from yesterday  FINDINGS: Left IJ catheter in similar position when accounting for distortion by rightward rotation.  Persistent cardiopericardial enlargement. Increased hazy density of the chest, likely bilateral layering effusions. There is pulmonary venous congestion and probable edema. No new asymmetric opacity. No pneumothorax.  IMPRESSION: 1. Pulmonary venous hypertension and probable layering pleural  effusions. 2. Bibasilar atelectasis.   Electronically Signed   By: Jorje Guild M.D.   On: 08/14/2013 04:09   Dg Chest Port 1 View  08/13/2013   CLINICAL DATA:  Central  line placement  EXAM: PORTABLE CHEST - 1 VIEW  COMPARISON:  08/12/2013  FINDINGS: Left jugular central venous catheter placement. Catheter tip in the SVC. No pneumothorax.  Increase in bibasilar atelectasis.  IMPRESSION: Central line placement in the SVC without pneumothorax  Increase in bibasilar atelectasis.   Electronically Signed   By: Franchot Gallo M.D.   On: 08/13/2013 10:24   Dg Chest Port 1 View  08/12/2013   CLINICAL DATA:  Pre-op for buttock abscess surgery.  EXAM: PORTABLE CHEST - 1 VIEW  COMPARISON:  DG CHEST 2 VIEW dated 06/07/2013; DG CHEST 2 VIEW dated 06/11/2012; DG CHEST 2 VIEW dated 03/17/2011  FINDINGS: 1618 hr. There are lower lung volumes with increased patchy perihilar opacities in the right lung. The left lung is clear. The heart size and mediastinal contours are stable. There is no pneumothorax or significant pleural effusion. The osseous structures appear unchanged.  IMPRESSION: New patchy perihilar opacities in the right lung, likely atelectasis. No consolidation or significant changes demonstrated.   Electronically Signed   By: Camie Patience M.D.   On: 08/12/2013 16:28   Dg Abd Portable 1v  08/14/2013   CLINICAL DATA:  NG placement  EXAM: PORTABLE ABDOMEN - 1 VIEW  COMPARISON:  None.  FINDINGS: The patient is rotated on this study. With technique taking into consideration an NG tube is appreciated coursing in the expected region of the stomach. The tip projects in the expected region of the gastric antrum/duodenal bulb. Air is seen within nondilated loops of small bowel. Degenerative changes are appreciated throughout the thoracolumbar spine.  IMPRESSION: NG tube which appears coarse in expected region of the stomach.   Electronically Signed   By: Margaree Mackintosh M.D.   On: 08/14/2013 15:59     Microbiology: Recent Results (from the past 240 hour(s))  CLOSTRIDIUM DIFFICILE BY PCR     Status: None   Collection Time    08/21/13 12:10 PM      Result Value Ref Range Status   C difficile by pcr NEGATIVE  NEGATIVE Final   Comment: Performed at [facility]: Basic Metabolic Panel:  Recent Labs Lab 08/23/13 0453 08/24/13 0545 08/25/13 0445 08/26/13 0547 08/27/13 0335 08/28/13 0620  NA 145 145 145 145 144 143  K 3.4* 3.6* 4.0 4.0 4.0 3.9  CL 105 106 102 99 96 94*  CO2 30 31 34* 38* 40* 42*  GLUCOSE 119* 129* 111* 114* 114* 93  BUN 16 14 12 12 13 16   CREATININE 0.94 0.99 1.04 1.17* 1.35* 1.57*  CALCIUM 8.8 8.6 9.0 9.3 9.2 9.2  MG 1.6  --   --   --   --   --    Liver Function Tests:  Recent Labs Lab 08/26/13 0547  AST 19  ALT 10  ALKPHOS 56  BILITOT 0.5  PROT 6.0  ALBUMIN 2.1*   No results found for this basename: LIPASE, AMYLASE,  in the last 168 hours No results found for this basename: AMMONIA,  in the last 168 hours CBC:  Recent Labs Lab 08/24/13 0545 08/25/13 0445 08/26/13 0547 08/27/13 0335 08/28/13 0620  WBC 8.1 9.9 10.3 10.1 10.5  HGB 9.1* 10.2* 9.6* 10.0* 10.1*  HCT 29.5* 32.0* 31.6* 32.4* 33.1*  MCV 94.9 93.6 95.5 96.1 97.6  PLT 302 358 317 321 292   Cardiac Enzymes: No results found for this basename: CKTOTAL, CKMB, CKMBINDEX, TROPONINI,  in the last 168 hours BNP: BNP (last 3 results)  Recent Labs  08/17/13 0528 08/26/13 0547  PROBNP 10639.0* 9195.0*   CBG:  Recent Labs Lab 08/26/13 2143 08/27/13 0752 08/27/13 1211 08/27/13 1702 08/28/13 0756  GLUCAP 125* 142* 168* 120* 86       Signed:  Karrah Mangini N  [facility] 08/28/2013, 2:41 PM .

## 2013-08-28 NOTE — Progress Notes (Signed)
Physical Therapy Treatment Patient Details Name: [name] MRN: [id number] DOB: 28-Oct-1929 Today's Date: 08/28/2013 Time: [id number] PT Time Calculation (min): 41 min  PT Assessment / Plan / Recommendation  History of Present Illness Admitted 08/12/13 with necrotizing fascitis of perineal area, s/p multiplae I& D's   PT Comments   Pt feeling tiered due to poor sleep last night.    Assisted pt OOB to attempt amb however pt had uncontrolled loose stools so assist to [facility].  Pt required + 2 assist to complete 1/4 turn from bed to [facility].  Required assistance with hygiene.  Transferred back to bed supine position for fressing change then back to EOB to transfer a third time to recliner.  At this point pt was too fatigued to attempt ambulation.  Positioned in recliner.   Follow Up Recommendations  SNF     Does the patient have the potential to tolerate intense rehabilitation     Barriers to Discharge        Equipment Recommendations  None recommended by PT    Recommendations for Other Services    Frequency Min 3X/week   Progress towards PT Goals Progress towards PT goals: Progressing toward goals  Plan      Precautions / Restrictions Precautions Precautions: Fall Precaution Comments: open wound perineum Restrictions Weight Bearing Restrictions: No   Pertinent Vitals/Pain    Mobility  Bed Mobility Overal bed mobility: Needs Assistance Bed Mobility: Supine to Sit;Sit to Supine Supine to sit: +2 for physical assistance;Max assist Sit to supine: +2 for physical assistance;Max assist General bed mobility comments: with use of rails pt was able to assist self from supine to EOB Transfers Overall transfer level: Needs assistance Equipment used: Rolling walker (2 wheeled) Transfers: Sit to/from Stand Sit to Stand: +2 physical assistance;+2 safety/equipment;Max assist;Mod assist General transfer comment: 25% VC's on proper hand placement and tech pt was able to support self and complete  1/4 trun from bed to [facility] back to bed then to recliner.  Performed transfer 3 times.  Ambulation/Gait Gait velocity: Wanted to amb pt however uncontrolled BM prevented attempt.  Pt did syand 3 times with RW during transfers pivoting feet and support her own body weight.  Too fatigued after using BSC,       PT Goals (current goals can now be found in the care plan section)    Visit Information  Assistance Needed: +2 History of Present Illness: Admitted 08/12/13 with necrotizing fascitis of perineal area, s/p multiplae I& D's    Subjective Data      Cognition       Balance     End of Session PT - End of Session Equipment Utilized During Treatment: Gait belt Activity Tolerance: Patient limited by fatigue Patient left: in chair;with call bell/phone within reach   Rica Koyanagi  PTA [address] [facility]  Acute  Rehab Pager      [phone]

## 2013-08-28 NOTE — Evaluation (Signed)
Occupational Therapy Evaluation Patient Details Name: [name] MRN: [id number] DOB: [birth year] Today's Date: 08/28/2013 Time: [id number] OT Time Calculation (min): 21 min  OT Assessment / Plan / Recommendation History of present illness Admitted 08/12/13 with necrotizing fascitis of perineal area, s/p multiplae I& D's   Clinical Impression   Pt was admitted for the above.  She will benefit from skilled OT to increase activity tolerance and independence with adls/toilet transfers.  Goals are still set at A x 2 level for safety    OT Assessment  Patient needs continued OT Services    Follow Up Recommendations  SNF    Barriers to Discharge      Equipment Recommendations  3 in 1 bedside comode    Recommendations for Other Services    Frequency  Min 2X/week    Precautions / Restrictions Precautions Precautions: Fall Precaution Comments: open wound perineum Restrictions Weight Bearing Restrictions: No   Pertinent Vitals/Pain VSS.  Pt c/o buttocks being sore--repositioned in bed    ADL  Grooming: Set up;Brushing hair Where Assessed - Grooming: Unsupported sitting Upper Body Bathing: Set up Where Assessed - Upper Body Bathing: Unsupported sitting Lower Body Bathing: +2 Total assistance Lower Body Bathing: Patient Percentage: 30% Where Assessed - Lower Body Bathing: Supported sit to stand;Rolling right and/or left Upper Body Dressing: Minimal assistance (lines) Where Assessed - Upper Body Dressing: Unsupported sitting Lower Body Dressing: +2 Total assistance Lower Body Dressing: Patient Percentage: 10% Where Assessed - Lower Body Dressing: Rolling right and/or left;Supported sit to stand Transfers/Ambulation Related to ADLs: pt fatiqued--poor night sleep but agreeable to OT eval and sitting eob.  Need A x 2 for sit to stand and pt requests not to stand this pm ADL Comments: encouraged pt to participate in bathing with NT to increase strength/endurance.  She reports she has  not been doing this.  Will introduce AE on next visit as pt would benefit from this for LB adls.      OT Diagnosis: Generalized weakness  OT Problem List: Decreased strength;Decreased activity tolerance;Impaired balance (sitting and/or standing);Decreased knowledge of use of DME or AE;Pain OT Treatment Interventions: Self-care/ADL training;DME and/or AE instruction;Patient/family education;Balance training;Therapeutic activities;Energy conservation   OT Goals(Current goals can be found in the care plan section) Acute Rehab OT Goals Patient Stated Goal: get stronger OT Goal Formulation: With patient Time For Goal Achievement: 09/11/13 Potential to Achieve Goals: Good ADL Goals Pt Will Transfer to Toilet: with +2 assist;bedside commode;stand pivot transfer (pt 50%) Additional ADL Goal #1: Pt will complete LB bathing with A x 2, pt 60% with AE, sit to stand Additional ADL Goal #2: Pt will tolerate sitting eob x 10 minutes for adl task with supervision  Visit Information  Last OT Received On: 08/28/13 Assistance Needed: +2 (+1 for eob) History of Present Illness: Admitted 08/12/13 with necrotizing fascitis of perineal area, s/p multiplae I& D's       Prior [place] expects to be discharged to:: Skilled nursing facility Prior Function Level of Independence: Independent with assistive device(s) Communication Communication: No difficulties Dominant Hand: Right         Vision/Perception     Cognition  Cognition Arousal/Alertness: Awake/alert Behavior During Therapy: WFL for tasks assessed/performed Overall Cognitive Status: Within Functional Limits for tasks assessed    Extremity/Trunk Assessment Upper Extremity Assessment Upper Extremity Assessment: Overall WFL for tasks assessed     Mobility Bed Mobility Overal bed mobility: Needs Assistance Bed Mobility: Supine to  Sit;Sit to Supine Supine to sit: Mod assist;HOB elevated Sit to supine:  Max assist General bed mobility comments: use of rail.  +2 to reposition back in bed     Exercise     Balance Balance Sitting-balance support: Feet supported;No upper extremity supported Sitting balance-Leahy Scale: Fair Sitting balance - Comments: propped on L elbow after sitting after sitting up for 3 minutes   End of Session OT - End of Session Activity Tolerance: Patient limited by fatigue Patient left: in bed;with call bell/phone within reach;with family/visitor present Nurse Communication:  (participation with OT)  GO     Desmen Schoffstall 08/28/2013, 4:30 PM Lesle Chris, OTR/L [phone] 08/28/2013

## 2013-08-28 NOTE — Progress Notes (Signed)
CRITICAL VALUE ALERT  Critical value received:  CO2=42  Date of notification:  08/28/13  Time of notification:  07:30  Critical value read back:yes  Nurse who received alert:  Bethann Humble, RN  MD notified (1st page):  ELINK  Time of first page:  07:30  MD notified (2nd page):  Time of second page:  Responding MD:  Warren Lacy  Time MD responded:  07:30

## 2013-08-28 NOTE — Progress Notes (Signed)
Pt asleep, no respiratory distress noted.  HR 78, rr24, sats97%.  Bipap now PRN only per RN.  Pt remains on 3lnc and tolerating well at this time.

## 2013-08-29 ENCOUNTER — Other Ambulatory Visit: Payer: Self-pay | Admitting: *Deleted

## 2013-08-29 LAB — BASIC METABOLIC PANEL
BUN: 18 mg/dL (ref 6–23)
CO2: 39 mEq/L — ABNORMAL HIGH (ref 19–32)
Calcium: 9.4 mg/dL (ref 8.4–10.5)
Chloride: 95 mEq/L — ABNORMAL LOW (ref 96–112)
Creatinine, Ser: 1.34 mg/dL — ABNORMAL HIGH (ref 0.50–1.10)
GFR calc Af Amer: 42 mL/min — ABNORMAL LOW (ref >90)
GFR calc non Af Amer: 36 mL/min — ABNORMAL LOW (ref >90)
Glucose, Bld: 181 mg/dL — ABNORMAL HIGH (ref 70–99)
Potassium: 4.3 mEq/L (ref 3.7–5.3)
Sodium: 142 mEq/L (ref 137–147)

## 2013-08-29 MED ORDER — PREDNISONE 50 MG PO TABS
60.0000 mg | ORAL_TABLET | Freq: Once | ORAL | Status: AC
  Administered 2013-08-29: 60 mg via ORAL
  Filled 2013-08-29: qty 1

## 2013-08-29 MED ORDER — FAMOTIDINE IN NACL 20-0.9 MG/50ML-% IV SOLN
20.0000 mg | Freq: Once | INTRAVENOUS | Status: DC
  Filled 2013-08-29: qty 50

## 2013-08-29 MED ORDER — PREDNISONE 20 MG PO TABS
40.0000 mg | ORAL_TABLET | Freq: Every day | ORAL | Status: DC
  Administered 2013-08-29: 40 mg via ORAL
  Filled 2013-08-29 (×3): qty 2

## 2013-08-29 MED ORDER — FAMOTIDINE 20 MG PO TABS: 20.0000 mg | ORAL_TABLET | Freq: Every day | ORAL | Status: DC

## 2013-08-29 MED ORDER — LOPERAMIDE HCL 2 MG PO CAPS
4.0000 mg | ORAL_CAPSULE | Freq: Once | ORAL | Status: AC
  Administered 2013-08-29: 4 mg via ORAL
  Filled 2013-08-29: qty 2

## 2013-08-29 MED ORDER — FAMOTIDINE 20 MG PO TABS
20.0000 mg | ORAL_TABLET | Freq: Every day | ORAL | Status: DC
  Administered 2013-08-29: 20 mg via ORAL
  Filled 2013-08-29: qty 1

## 2013-08-29 MED ORDER — TRAMADOL-ACETAMINOPHEN 37.5-325 MG PO TABS: ORAL_TABLET | ORAL | Status: DC

## 2013-08-29 MED ORDER — PREDNISONE 20 MG PO TABS: 30.0000 mg | ORAL_TABLET | Freq: Every day | ORAL | Status: DC

## 2013-08-29 NOTE — Progress Notes (Signed)
9 Days Post-Op  Subjective: Pt transferred from ICU and scheduled to go to SNF later today.  She still has the rash all over.  She stooled into her wound again.  [facility] has been ask to see.  They are cleaning  Her up and we plan to continue BID and prn dressing changes.  I would defer rash to Medicine.  She is tolerating all of this amazingly well  Objective: Vital signs in last 24 hours: Temp:  [98.1 F (36.7 C)-99.1 F (37.3 C)] 98.1 F (36.7 C) (03/19 0453) Pulse Rate:  [75-92] 75 (03/19 0453) Resp:  [20-33] 24 (03/19 0453) BP: (145-153)/(22-46) 153/46 mmHg (03/19 0453) SpO2:  [92 %-100 %] 97 % (03/19 1032) Weight:  [114.306 kg (252 lb)] 114.306 kg (252 lb) (03/19 0453) Last BM Date: 08/28/13 560 PO recorded. Single stool recorded yesterday and this Am Hydrotherapy ask to clean site. Afebrile, VSS Creatinine 1.34 WBC 10.5 yesterday Intake/Output from previous day: 03/18 0701 - 03/19 0700 In: 680 [P.O.:560; I.V.:100] Out: 1530 [Urine:1530] Intake/Output this shift: Total I/O In: 240 [P.O.:240] Out: -   General appearance: alert, cooperative and no distress Skin: The open area of her perineum is extensive, and currently contaminated with stool, she is still having loose stools.  It is clean and pink from a surgical point.  Lab Results:   Recent Labs  08/27/13 0335 08/28/13 0620  WBC 10.1 10.5  HGB 10.0* 10.1*  HCT 32.4* 33.1*  PLT 321 292    BMET  Recent Labs  08/28/13 0620 08/29/13 0625  NA 143 142  K 3.9 4.3  CL 94* 95*  CO2 42* 39*  GLUCOSE 93 181*  BUN 16 18  CREATININE 1.57* 1.34*  CALCIUM 9.2 9.4   PT/INR No results found for this basename: LABPROT, INR,  in the last 72 hours   Recent Labs Lab 08/26/13 0547  AST 19  ALT 10  ALKPHOS 56  BILITOT 0.5  PROT 6.0  ALBUMIN 2.1*     Lipase     Component Value Date/Time   LIPASE 28 12/11/2010 1755     Studies/Results: No results found.  Medications: . albuterol  2.5 mg Nebulization  Q6H  . anastrozole  1 mg Oral q morning - 10a  . antiseptic oral rinse  15 mL Mouth Rinse QID  . aspirin EC  325 mg Oral Daily  . chlorhexidine  15 mL Mouth Rinse BID  . enoxaparin (LOVENOX) injection  60 mg Subcutaneous Q24H  . famotidine  20 mg Oral Daily  . feeding supplement (ENSURE COMPLETE)  237 mL Oral QID  . furosemide  40 mg Intravenous Daily  . hydrocortisone cream   Topical BID  . predniSONE  40 mg Oral Q breakfast  . saccharomyces boulardii  250 mg Oral BID    Assessment/Plan 1. Necrotizing Fasciitis of the perineum  2. S/p Debridement and drainage 08/13/13,TG; 08/15/13, TG; 08/16/13, TG; 08/19/13 AR; 08/20/13 AR, with wound vac placement that could not remain sealed.  3. Sepsis secondary to fasciitis  4. Hypotension/with hx of hypertension  5. Acute respiratory failure/heart failure with elevated Pro"BNP, 10639 on 08/16/13  6. Acute Kidney failure  7. Morbid obesity Body mass index is 51  8. Hx of cardiac disease, rheumatic fever, followed by [facility]  9. Hx of AF/DVT  10. Right breast CA with lumpectomy/radiation Rx/I/D of breast abscess 2013 Dr. Hassell Done  11. Bilateral knee replacements/right hip replacement (using a walker Jan. 2014)  12. Hx of colon perforation  during colonoscopy 1990's  13. ERCP/cholecystectomy 2013  14. Hx of stroke  15. Diabetes in 2012 note, but I do not see any place where she has been treated for this.  16. Gout 17.  Rash all over Plan;  I examined her with hydrotherapy, she has liquid stool with the open area.  This is being cleaned up.  Nothing else new from our standpoint. I will discuss with Dr. Zella Richer.  Rash is till an issue and defer that to Medicine.    LOS: 17 days    Kardell Virgil 08/29/2013

## 2013-08-29 NOTE — Progress Notes (Signed)
Patient seen.  If she has significant stool contamination of the wood on a routine basis, may need to consider loop colostomy at some point in the future.

## 2013-08-29 NOTE — Progress Notes (Signed)
Patient transferred to floor from ICU around 05:00.  Pt stable and complaint free.  Will continue to monitor patient.  [place], Jaelynn Pozo Cherie

## 2013-08-29 NOTE — Progress Notes (Addendum)
Patient transferred to [place] unable to take patient. Patient to be transferred to [facility] today. CSW met with patient and patient's husband at bedside. Patient is alert and oriented X3. Patient is agreeable to transfer to [facility] today. Packet copied and placed in [place]. ptar called for transportation.  Tikita Mabee C. [place] MSW, [place]

## 2013-08-29 NOTE — Progress Notes (Signed)
Comments:  Notified by RN regarding pt's 2 day h/o persistent itchy rash. Reports Benadryl Cream ordered is not helping and pt is reluctant to take the PO Benadryl because it makes her sleepy. Pt experiencing significant itching and discomfort w/ rash. At bedside noted dense, raised extremely erythematous rash to entire body though less severe on face, feet, bil FA's and hands. It is not oozing. Pt is afebrile.  Assessment/Plan: 1. Pruritic rash: Etiology unclear. Will give PO Prednisone, Pepcid and encourage pt to take PO Benadryl at least tonight. Would continue Prednisone (if not contraindicated) and Pepcid QD x 7 days and PRN Benadryl PO as tolerates and/or Benadryl cream. Will continue to monitor closely.  Jeryl [place], NP-C [facility] Pager [phone]

## 2013-08-29 NOTE — Care Management Note (Signed)
    Page 1 of 2   08/29/2013     3:48:26 PM   CARE MANAGEMENT NOTE 08/29/2013  Patient:  [name],[name]   Account Number:  [id number]  Date Initiated:  08/15/2013  Documentation initiated by:  DAVIS,RHONDA  Subjective/Objective Assessment:   patient transferred from med surg floor on [id number] after i and d for ncrotizing facsitis of the labia and requiring intubation.     Action/Plan:   tbd based on progress of resp and surg conditions.   Anticipated DC Date:  08/29/2013   Anticipated DC Plan:  SKILLED NURSING FACILITY  In-house referral  NA      DC Planning Services  CM consult      PAC Choice  NA   Choice offered to / List presented to:  NA   DME arranged  NA      DME agency  NA     [place] arranged  NA      [place] agency  NA   Status of service:  Completed, signed off Medicare Important Message given?  NA - LOS <3 / Initial given by admissions (If response is "NO", the following Medicare IM given date fields will be blank) Date Medicare IM given:   Date Additional Medicare IM given:    Discharge Disposition:  [place]  Per UR Regulation:  Reviewed for med. necessity/level of care/duration of stay  If discussed at [place] of Stay Meetings, dates discussed:   08/20/2013  08/22/2013  08/29/2013    Comments:  08/29/13 [facility] RN,BSN NCM [phone] D/C SNF.  [id number] Rosana Hoes, RN, BSN, CCM [phone] Chart Reviewed for discharge and hospital needs. Discharge needs at time of review:  None present will follow for needs. Review of patient progress due on [id number]. pt remains able to be extubated, dressing changes require two people and take approx. 15 to 68mins to do due to size and location.  Wound improving despite stool contamination,even with the tube feeds being stopped several days ago. Patient is eating [name] diet at this time.  [id number] Rosana Hoes, RN, BSN, CCM, [phone] Chart reviewed for update of needs and  condition. Patient successfully extubated on [id number]/p.o. diet started, t.feeding stopped/ pt will be on bipap at night due to desats.  Daily dressing changes continue to right labial area.  Diarreha slowed with dc of tube feeding. icu day 11.  [id number]/Rhonda Rosana Hoes, [place], [place], [place] [phone] Chart Reviewed for discharge and hospital needs. Discharge needs at time of review:  None present will follow for needs. Review of patient progress due on [id number]. patient continues to require full vent support and returning trips to O.R. due to right labia necrotizing fascitis.   [id number] Rosana Hoes, RN, BSN, [place] [phone] Chart Reviewed for discharge and hospital needs. Discharge needs at time of review:  None present will follow for needs. Review of patient progress due on [id number]. pod 1/ vent day 1/ icu day 1

## 2013-08-29 NOTE — Progress Notes (Signed)
Report called to [facility] @ [employer].

## 2013-08-29 NOTE — Progress Notes (Signed)
Clinical Social Work Department CLINICAL SOCIAL WORK PLACEMENT NOTE 08/29/2013  Patient:  [name],[name]  Account Number:  [id number] Admit date:  08/12/2013  Clinical Social Worker:  Renold Genta  Date/time:  08/26/2013 03:24 PM  Clinical Social Work is seeking post-discharge placement for this patient at the following level of care:   SKILLED NURSING   (*CSW will update this form in Epic as items are completed)   08/26/2013  Patient/family provided with [facility] Department of Clinical Social Work's list of facilities offering this level of care within the geographic area requested by the patient (or if unable, by the patient's family).  08/26/2013  Patient/family informed of their freedom to choose among providers that offer the needed level of care, that participate in Medicare, Medicaid or managed care program needed by the patient, have an available bed and are willing to accept the patient.  08/26/2013  Patient/family informed of [facility]' ownership interest in [facility], as well as of the fact that they are under no obligation to receive care at this facility.  PASARR submitted to EDS on 08/26/2013 PASARR number received from EDS on 08/26/2013  FL2 transmitted to all facilities in geographic area requested by pt/family on  08/26/2013 FL2 transmitted to all facilities within larger geographic area on   Patient informed that his/her managed care company has contracts with or will negotiate with  certain facilities, including the following:     Patient/family informed of bed offers received:  08/26/2013 Patient chooses bed at [place] Physician recommends and patient chooses bed at    Patient to be transferred to [place] on  08/29/2013 Patient to be transferred to facility by ptar  The following physician request were entered in Epic:   Additional Comments:

## 2013-08-29 NOTE — Telephone Encounter (Signed)
[facility] 

## 2013-08-29 NOTE — Progress Notes (Signed)
HYDRO Therapy:   1x eval only  08/29/13 1030  Subjective Assessment  Subjective I am doing okay  Patient and Family Stated Goals To get it better  Prior Treatments I and D of perineum from necrtozing fascitis  Evaluation and Treatment  Evaluation and Treatment Procedures Explained to Patient/Family Yes  Evaluation and Treatment Procedures agreed to  Wound / Incision (Open or Dehisced) Other (Comment) Perineum  No Date First Assessed or Time First Assessed found.   Wound Type: Other (Comment)  Location: Perineum  Present on Admission: Yes  Dressing Type Gauze (Comment);Paper tape (Wet to dry kerlix for packing)  Dressing Changed Changed  Dressing Status Clean  Dressing Change Frequency Twice a day  Site / Wound Assessment Granulation tissue;Red (covered in yellow thin feces; cleaned prior to pulsed lavage)  % Wound base Red or Granulating 100%  Peri-wound Assessment Edema;Erythema (blanchable)  Margins Unattached edges (unapproximated)  Closure None  Drainage Amount Minimal  Drainage Description Serosanguineous  Non-staged Wound Description Full thickness  Treatment Cleansed;Debridement (Selective);Hydrotherapy (Pulse lavage);Packing (Saline gauze)  Hydrotherapy  Pulsed Lavage with Suction (psi) 8 psi  Pulsed Lavage with Suction - Normal Saline Used 1000 mL  Pulsed Lavage Tip Tip with splash shield  Pulsed lavage therapy - wound location perineum  Selective Debridement  Selective Debridement - Location perineum  Selective Debridement - Tools Used (just the pusled lavage no sharps)  Wound Therapy - Assess/Plan/Recommendations  Wound Therapy - Clinical Statement Was order priro to DC to assess need oor depbridement of wound. Pt was cleaned with tip and dressing in place, no need for further pulsed lavage , just recommend squirting saline to help assist in cleaning frquently with frequent dressing changes by nursing.   Wound Therapy - Functional Problem List none needed, 1 x eval only    Factors Delaying/Impairing Wound Healing Immobility;Other (comment) (location and obesity)  Hydrotherapy Plan Other (comment) (no plan due to one time eval only , no further hydro thrapy )  Wound Therapy - Frequency (no further hydro therapy needed)  Wound Therapy - Current Recommendations Nutritionist;PT  Wound Therapy - Follow Up Recommendations Skilled nursing facility  [employer], [place] Pager: [phone] 08/29/2013

## 2013-08-30 ENCOUNTER — Non-Acute Institutional Stay (SKILLED_NURSING_FACILITY): Payer: Medicare Other | Admitting: Internal Medicine

## 2013-08-30 DIAGNOSIS — N289 Disorder of kidney and ureter, unspecified: Secondary | ICD-10-CM

## 2013-08-30 DIAGNOSIS — N183 Chronic kidney disease, stage 3 unspecified: Secondary | ICD-10-CM

## 2013-08-30 DIAGNOSIS — T3695XA Adverse effect of unspecified systemic antibiotic, initial encounter: Secondary | ICD-10-CM

## 2013-08-30 DIAGNOSIS — E119 Type 2 diabetes mellitus without complications: Secondary | ICD-10-CM

## 2013-08-30 DIAGNOSIS — K521 Toxic gastroenteritis and colitis: Secondary | ICD-10-CM

## 2013-08-30 DIAGNOSIS — R197 Diarrhea, unspecified: Secondary | ICD-10-CM

## 2013-08-30 DIAGNOSIS — M726 Necrotizing fasciitis: Secondary | ICD-10-CM

## 2013-08-30 DIAGNOSIS — I5033 Acute on chronic diastolic (congestive) heart failure: Secondary | ICD-10-CM

## 2013-08-30 DIAGNOSIS — R21 Rash and other nonspecific skin eruption: Secondary | ICD-10-CM

## 2013-08-30 DIAGNOSIS — I509 Heart failure, unspecified: Secondary | ICD-10-CM

## 2013-08-30 DIAGNOSIS — C50911 Malignant neoplasm of unspecified site of right female breast: Secondary | ICD-10-CM

## 2013-08-30 DIAGNOSIS — C50919 Malignant neoplasm of unspecified site of unspecified female breast: Secondary | ICD-10-CM

## 2013-08-31 ENCOUNTER — Encounter: Payer: Self-pay | Admitting: Internal Medicine

## 2013-08-31 DIAGNOSIS — I5033 Acute on chronic diastolic (congestive) heart failure: Secondary | ICD-10-CM | POA: Insufficient documentation

## 2013-08-31 NOTE — Progress Notes (Signed)
Patient ID: [name], female   DOB: 1930/01/04, 80 y.o.   MRN: [id number]  Provider:  Rexene Edison. Mariea Clonts, D.O., C.M.D.  Location:  [facility]  PCP: [name], MD  Code Status: DNR--she is very clear that she never would want to be on a ventilator again  Allergies  Allergen Reactions  . Ace Inhibitors Other (See Comments)    Tolerates lisinopril at home. Pt does not recall allergy.  . Atorvastatin     REACTION: myalgias  . Oxycodone-Acetaminophen     REACTION: confusion, fatigue  . Rosuvastatin     REACTION: leg weakness  . Simvastatin     REACTION: leg weakness  . Codeine Nausea And Vomiting and Rash  . Sulfonamide Derivatives Hives and Rash    Chief Complaint  Patient presents with  . Hospitalization Follow-up    new admission for rehab s/p hospitalization for debridement of necrotizing fasciitis of perineum    HPI: 80 y.o. female with h/o DMII, hyperlipidemia, gout, morbid obesity, breast cancer on right, CKD3 was admitted here for short term rehab s/p a complicated hospitalization for debridement of necrotizing fasciitis of her right labia.  This was performed on [id number], but was complicated by septic shock requiring intubation.  She was placed on empiric vanc, zosyn, and clindamycin.  She's had a foley in place to prevent irritation of the healing area.  She was extubated 3/12.  She also developed profuse diarrhea that has improved slowly but does still continue.  She has been C diff toxin negative.  She is on florastor.  She also had difficulty with paroxysmal afib and diastolic heart failure.  She was diuresed with lasix 40mg  po bid and this was then reduced to daily.  She also had some acute on chronic renal failure that improved by d/c.  Her ace inhibitor was stopped and it appears in her allergy list thought she was uncertain why.  She had a rash felt to be abx-induced on her entire back and posterior arms and legs--it is less pruritic since being placed on a  prednisone course.    ROS: Review of Systems  Constitutional: Positive for malaise/fatigue. Negative for fever, chills and weight loss.  HENT: Negative for congestion.   Eyes: Negative for blurred vision.  Respiratory: Negative for shortness of breath and wheezing.        Still requiring oxygen  Cardiovascular: Positive for leg swelling. Negative for chest pain.       Notes improvement of edema  Gastrointestinal: Positive for diarrhea. Negative for heartburn, nausea, vomiting, abdominal pain, constipation, blood in stool and melena.  Genitourinary: Negative for dysuria.       Foley in place  Musculoskeletal: Negative for falls and myalgias.  Skin: Positive for itching and rash.  Neurological: Positive for weakness. Negative for dizziness, loss of consciousness and headaches.  Endo/Heme/Allergies: Bruises/bleeds easily.  Psychiatric/Behavioral: Negative for depression and memory loss. The patient is nervous/anxious.      Past Medical History  Diagnosis Date  . Hypertension   . Hyperlipidemia   . Morbid obesity   . Osteoarthritis   . Gout   . CVA (cerebral infarction)   . IBS (irritable bowel syndrome)   . Diverticulosis   . Esophageal stricture   . Colon polyps     hyperplastic  . Anxiety   . Hiatal hernia     4 cm  . Shingles 02/21/2011  . Skin cancer   . Breast cancer     right  breast  . Dysrhythmia     dr bensimhon   . Blood transfusion   . Chronic kidney disease     occ uti's  . GERD (gastroesophageal reflux disease)   . PONV (postoperative nausea and vomiting)   . DVT of leg (deep venous thrombosis)     left; S/P OR  . Atrial flutter     "sometimes"  . Bronchitis   . Pneumonia     "walking"  . Shortness of breath on exertion   . Anemia   . Stroke 2002    residual:  "little tingling in palm of left hand"  . Kidney stones 1990's  . UTI (lower urinary tract infection)     "I've had a few"  . History of radiation therapy 02/15/12-04/02/12    right breast  4680 cGy/26 sessions, right boost=1400cGy /7 sessions   Past Surgical History  Procedure Laterality Date  . Abdominal hysterectomy    . Bile duct stent placement  ~ 01/2011  . Pelvic bone tumor removal      twice in 2000's  . Colonic perforation repair  ~ 2000  . Total hip arthroplasty  1980's    right  . Joint replacement    . Colon surgery      hole in intestine ,tumors?  . Tonsillectomy  ~ 1946  . Cholecystectomy  03/23/11    lap chole   . Replacement total knee bilateral  ~ 2008; ~ 2010    right; left  . Cataract extraction w/ intraocular lens  implant, bilateral  1990's  . Hemorrhoid surgery  2012  . Breast lumpectomy  06/29/11    right  . Breast lumpectomy  06/29/2011    Procedure: BREAST LUMPECTOMY WITH EXCISION OF SENTINEL NODE;  Surgeon: Pedro Earls, MD;  Location: [facility];  Service: General;  Laterality: Right;  right sentinel node mapping,right sentinel node biopsy, needle localization right breast lumpectomy  . Incision and drainage breast abscess      right breast seroma  . Incision and drainage abscess N/A 08/12/2013    Procedure: INCISION AND DRAINAGE ABSCESS;  Surgeon: Earnstine Regal, MD;  Location: [facility];  Service: General;  Laterality: N/A;  . Irrigation and debridement abscess N/A 08/14/2013    Procedure: Debridment of perineal tissue and debridement of perineal wound;  Surgeon: Earnstine Regal, MD;  Location: [facility];  Service: General;  Laterality: N/A;  . Irrigation and debridement abscess N/A 08/16/2013    Procedure: DRESSING CHANGE AND DEBRIDEMENT OF PERINEUM WOUND;  Surgeon: Earnstine Regal, MD;  Location: [facility];  Service: General;  Laterality: N/A;  . Pilonidal cyst drainage N/A 08/19/2013    Procedure: Dressing change perineum;  Surgeon: Ralene Ok, MD;  Location: [facility];  Service: General;  Laterality: N/A;  . Minor application of wound vac N/A 08/20/2013    Procedure: MINOR APPLICATION OF WOUND VAC;  Surgeon: Ralene Ok, MD;  Location: [facility];  Service:  General;  Laterality: N/A;  . Incision and drainage abscess N/A 08/20/2013    Procedure: INCISION AND DRAINAGE ABSCESS;  Surgeon: Ralene Ok, MD;  Location: [facility];  Service: General;  Laterality: N/A;   Social History:   reports that she has never smoked. She has never used smokeless tobacco. She reports that she does not drink alcohol or use illicit drugs.  Family History  Problem Relation Age of Onset  . Breast cancer Sister     x 2  . Diabetes Sister   . Prostate  cancer Brother   . Colon cancer Neg Hx   . Stroke Mother   . Kidney failure Sister   . Heart disease Paternal Uncle     multiple  . Heart disease Maternal Uncle     multiple    Medications: Patient's Medications  New Prescriptions   No medications on file  Previous Medications   ALBUTEROL (PROVENTIL) (2.5 MG/3ML) 0.083% NEBULIZER SOLUTION    Take 3 mLs (2.5 mg total) by nebulization every 6 (six) hours.   ALLOPURINOL (ZYLOPRIM) 100 MG TABLET    Take 100 mg by mouth every other day.   ANASTROZOLE (ARIMIDEX) 1 MG TABLET    Take 1 mg by mouth every morning.   ASPIRIN 325 MG EC TABLET    Take 325 mg by mouth every other day.   ASPIRIN EC 81 MG TABLET    Take 243 mg by mouth every other day.    CHOLECALCIFEROL (VITAMIN D) 1000 UNITS TABLET    Take 1,000 Units by mouth every morning.    FAMOTIDINE (PEPCID) 20 MG TABLET    Take 1 tablet (20 mg total) by mouth daily.   FUROSEMIDE (LASIX) 40 MG TABLET    Take 40 mg by mouth every morning.   HYDROCORTISONE CREAM 1 %    Apply topically 2 (two) times daily.   POLYVINYL ALCOHOL (LIQUIFILM TEARS) 1.4 % OPHTHALMIC SOLUTION    Place 2 drops into both eyes daily as needed for dry eyes.   PREDNISONE (DELTASONE) 20 MG TABLET    Take 1.5 tablets (30 mg total) by mouth daily with breakfast.   SACCHAROMYCES BOULARDII (FLORASTOR) 250 MG CAPSULE    Take 1 capsule (250 mg total) by mouth 2 (two) times daily.   TRAMADOL-ACETAMINOPHEN (ULTRACET) 37.5-325 MG PER TABLET    Take one tablet  by mouth every 6 hours as needed for pain  Modified Medications   No medications on file  Discontinued Medications   No medications on file     Physical Exam: Physical Exam  Constitutional: She is oriented to person, place, and time. She appears well-developed and well-nourished. No distress.  HENT:  Head: Normocephalic and atraumatic.  Right Ear: External ear normal.  Left Ear: External ear normal.  Nose: Nose normal.  Mouth/Throat: Oropharynx is clear and moist. No oropharyngeal exudate.  Eyes: Conjunctivae and EOM are normal. Pupils are equal, round, and reactive to light.  Neck: Normal range of motion. Neck supple. No JVD present.  Cardiovascular: Normal rate, regular rhythm, normal heart sounds and intact distal pulses.   Pulmonary/Chest: Effort normal and breath sounds normal. She has no rales.  Wearing O2 2L  Abdominal: Soft. Bowel sounds are normal. She exhibits no distension and no mass. There is no tenderness.  Genitourinary:  Dressing in place covering the surgical site, less painful  Musculoskeletal: Normal range of motion. She exhibits edema. She exhibits no tenderness.  1+ edema  Neurological: She is alert and oriented to person, place, and time. No cranial nerve deficit.  Skin: Skin is warm and dry.  Macular rash over entire back, posterior arms, legs  Psychiatric: She has a normal mood and affect.     Labs reviewed: Basic Metabolic Panel:  Recent Labs  08/13/13 0310 08/14/13 0351 08/15/13 0215  08/23/13 0453  08/27/13 0335 08/28/13 0620 08/29/13 0625  NA 138 141 145  < > 145  < > 144 143 142  K 4.6 4.0 4.0  < > 3.4*  < > 4.0 3.9 4.3  CL  103 107 113*  < > 105  < > 96 94* 95*  CO2 24 20 21   < > 30  < > 40* 42* 39*  GLUCOSE 171* 118* 139*  < > 119*  < > 114* 93 181*  BUN 53* 43* 42*  < > 16  < > 13 16 18   CREATININE 1.57* 1.28* 1.30*  < > 0.94  < > 1.35* 1.57* 1.34*  CALCIUM 8.0* 8.4 8.6  < > 8.8  < > 9.2 9.2 9.4  MG  --  1.8 1.7  --  1.6  --   --    --   --   PHOS  --  3.4 3.1  --   --   --   --   --   --   < > = values in this interval not displayed. Liver Function Tests:  Recent Labs  01/29/13 1411 08/26/13 0547  AST 49* 19  ALT 37 10  ALKPHOS 104 56  BILITOT 0.68 0.5  PROT 7.1 6.0  ALBUMIN 3.1* 2.1*  CBC:  Recent Labs  01/29/13 1411 08/12/13 1330  08/26/13 0547 08/27/13 0335 08/28/13 0620  WBC 6.9 17.9*  < > 10.3 10.1 10.5  NEUTROABS 4.5 15.1*  --   --   --   --   HGB 13.5 12.7  < > 9.6* 10.0* 10.1*  HCT 41.7 39.5  < > 31.6* 32.4* 33.1*  MCV 91.6 93.6  < > 95.5 96.1 97.6  PLT 165 187  < > 317 321 292  < > = values in this interval not displayed.  CBG:  Recent Labs  08/27/13 1211 08/27/13 1702 08/28/13 0756  GLUCAP 168* 120* 86   Imaging and Procedures: Dg Chest Port 1 View  08/27/2013 CLINICAL DATA: Respiratory failure and dyspnea EXAM: PORTABLE CHEST - 1 VIEW COMPARISON: DG CHEST 1V PORT dated 08/26/2013 FINDINGS: The lungs are reasonably well inflated. The interstitial markings remain increased but are slightly less conspicuous today. The hemidiaphragms remain obscured. The cardiopericardial silhouette remains enlarged and the pulmonary vascularity remains engorged. The pulmonary interstitial markings are perhaps slightly less conspicuous today. The left internal jugular venous catheter tip lies in the region of the proximal SVC. IMPRESSION: There has been slight interval improvement in the appearance of the pulmonary interstitium since yesterday's study suggesting interval decrease in interstitial edema. Considerable abnormality remains however. Electronically Signed By: David Martinique On: 08/27/2013 07:59   Dg Chest Port 1 View  08/26/2013 CLINICAL DATA: Respiratory failure. EXAM: PORTABLE CHEST - 1 VIEW COMPARISON: Chest x-ray 08/25/2013. FINDINGS: There is a left-sided internal jugular central venous catheter with tip terminating in the mid superior vena cava. Lung volumes are low. There is cephalization of the  pulmonary vasculature and slight indistinctness of the interstitial markings suggestive of mild pulmonary edema. Small to moderate bilateral pleural effusions. Mild cardiomegaly. The patient is rotated to the left on today's exam, resulting in distortion of the mediastinal contours and reduced diagnostic sensitivity and specificity for mediastinal pathology. Atherosclerosis in the thoracic aorta. IMPRESSION: 1. Support apparatus, as above. 2. Low lung volumes with evidence of congestive heart failure, as above. Electronically Signed By: Vinnie Langton M.D. On: 08/26/2013 07:09   Dg Chest Port 1 View  08/25/2013 CLINICAL DATA: Respiratory failure EXAM: PORTABLE CHEST - 1 VIEW COMPARISON: 08/24/2013 FINDINGS: Left jugular central venous line is again seen and stable. Cardiac shadow is stable. Bilateral pleural effusions are noted as well as a degree of vascular congestion the overall appearance  is stable from the prior study. IMPRESSION: No significant interval change from the prior exam. Electronically Signed By: Inez Catalina M.D. On: 08/25/2013 07:32   Dg Chest Port 1 View  08/24/2013 CLINICAL DATA: Followup atelectasis EXAM: PORTABLE CHEST - 1 VIEW COMPARISON: 08/23/2013 FINDINGS: Progression of right lower lobe airspace consolidation. There is a moderate right effusion. Left lower lobe consolidation and small left effusion are similar. There is vascular congestion with probable mild edema Left jugular catheter tip in the SVC. IMPRESSION: Findings consistent with congestive heart failure with edema and bilateral effusions. Progression of right lower lobe consolidation which may be atelectasis or infiltrate. Electronically Signed By: Franchot Gallo M.D. On: 08/24/2013 06:43   Dg Chest Port 1 View  08/23/2013 CLINICAL DATA: Extubation. EXAM: PORTABLE CHEST - 1 VIEW COMPARISON: DG CHEST 1V PORT dated 08/22/2013; DG CHEST 1V PORT dated 08/21/2013 FINDINGS: Interim extubation. Left IJ line in stable position. Interim  removal of NG tube. Progressive right lower lobe atelectasis is present. Cardiomegaly with pulmonary vascular prominence and interstitial prominence with bilateral pleural effusions. Findings consistent congestive heart failure. No pneumothorax. IMPRESSION: 1. Interim extubation and removal of NG tube. Left IJ line in stable position. 2. Severe right lower lobe atelectasis and consolidation. 3. Congestive heart failure with pulmonary interstitial edema and bilateral pleural effusions. Electronically Signed By: Sunrise On: 08/23/2013 08:00   Dg Chest Port 1 View  08/22/2013 CLINICAL DATA Evaluate infiltrates. EXAM PORTABLE CHEST - 1 VIEW COMPARISON DG CHEST 1V PORT dated 08/21/2013 FINDINGS Endotracheal tube is roughly 2.7 cm above the carina. Nasogastric tube extends into the abdomen. Patchy interstitial densities bilaterally have not changed. Persistent densities at both lung bases are concerning for areas of consolidation and pleural fluid. Heart appears to be upper limits of normal for size. Patient is rotated towards the right on the study. There is a left jugular central venous catheter near the upper SVC but poorly evaluated. Negative for a pneumothorax. IMPRESSION Diffuse interstitial densities are suggestive for pulmonary edema. Suspect bilateral pleural effusions with basilar consolidation or atelectasis. Support apparatuses as described. SIGNATURE Electronically Signed By: Markus Daft M.D. On: 08/22/2013 06:54   Dg Chest Port 1 View  08/21/2013 CLINICAL DATA Follow-up atelectasis. EXAM PORTABLE CHEST - 1 VIEW COMPARISON DG CHEST 1V PORT dated 08/20/2013 FINDINGS The cardiac silhouette appears mild to moderately enlarged, similar. Mediastinal silhouette is nonsuspicious, mildly calcified aortic knob. Similar mild interstitial prominence, with bibasilar airspace opacities and small pleural effusion, similar. Strandy midlung zone densities likely reflect atelectasis. No pneumothorax. Endotracheal tube  tip projects 2.4 cm above the carina. Left internal jugular central venous catheter with distal tip approximating the proximal superior vena cava. Nasogastric tube past the GE junction the distal tip not imaged. Multiple EKG lines overlie the patient and may obscure subtle underlying pathology. IMPRESSION No apparent change in life-support lines. Stable cardiomegaly, interstitial prominence likely reflects pulmonary edema with bibasilar airspace opacities, midlung zone atelectasis and small pleural effusions, similar. SIGNATURE Electronically Signed By: Elon Alas On: 08/21/2013 06:55   Dg Chest Port 1 View  08/20/2013 CLINICAL DATA: Hypoxia EXAM: PORTABLE CHEST - 1 VIEW COMPARISON: Study obtained earlier in the day FINDINGS: Endotracheal tube tip is 2.4 cm above the carina. Nasogastric tube in side-port extend below the diaphragm. Central catheter tip is in the superior vena cava just beyond the junction with the left innominate vein. No pneumothorax. There is cardiomegaly with bilateral effusions and patchy left and to a lesser extent right mid lung atelectatic change. There is  no new opacity. IMPRESSION: Tube and catheter positions as described without pneumothorax. Evidence of a degree of underlying congestive heart failure. Patchy mid lung atelectatic change bilaterally, stable. Electronically Signed By: Lowella Grip M.D. On: 08/20/2013 07:58   Dg Chest Port 1 View  08/20/2013 CLINICAL DATA: Evaluate lung fields. EXAM: PORTABLE CHEST - 1 VIEW COMPARISON: DG CHEST 1V PORT dated 08/18/2013 FINDINGS: Endotracheal tube and enteric tube appear unchanged. The left IJ central line has been advanced and the redundant loop in the neck is no longer visualized. The tip of the left IJ central line is at the confluence of the brachiocephalic veins. Bilateral pleural effusions, left-greater-than-right basilar predominant airspace disease and basilar predominant atelectasis is unchanged compared to prior.  Cardiopericardial silhouette unchanged. Patient is rotated to the right. IMPRESSION: 1. Adjustment of support apparatus detailed above. 2. Unchanged appearance of the heart and lungs. Electronically Signed By: Dereck Ligas M.D. On: 08/20/2013 07:48   Dg Chest Port 1 View  08/18/2013 CLINICAL DATA: Endotracheal tube placement EXAM: PORTABLE CHEST - 1 VIEW COMPARISON: Prior radiograph from 08/17/2013 FINDINGS: Tip of the endotracheal tube is positioned approximately 1.7 cm above the carina. Enteric tube courses into the abdomen. Left IJ approach central venous catheter is stable in position. The catheter is coiled near its insertion site in the lower left neck. Cardiomegaly is stable. There has been slight interval worsening in diffuse pulmonary vascular congestion with indistinctness of the interstitial markings, suggestive of progressive edema. Small moderate bilateral pleural effusions with associated bibasilar opacities are similar. No pneumothorax. No definite new focal infiltrate. Osseous structures are unchanged. IMPRESSION: 1. Tip of the endotracheal tube 1.7 cm above the carina. 2. Slight interval worsening in diffuse pulmonary vascular congestion with indistinctness of the interstitial markings, suggesting worsened edema. 3. Similar bilateral pleural effusions with associated bibasilar opacities, likely atelectasis. Electronically Signed By: Jeannine Boga M.D. On: 08/18/2013 06:18   Dg Chest Port 1 View  08/17/2013 CLINICAL DATA: Intubation EXAM: PORTABLE CHEST - 1 VIEW COMPARISON: Prior radiograph from 08/15/2013 FINDINGS: Tip of the endotracheal tube is positioned approximately 2 cm above the carina. Left IJ approach system venous catheter is gross stable. Enteric tube courses into the abdomen. Cardiomegaly is unchanged. Bilateral pleural effusions with associated bibasilar opacities are not significantly changed. Diffuse pulmonary vascular congestion without frank pulmonary edema is similar. No  pneumothorax. Osseous structures are unchanged. IMPRESSION: 1. Tip of the endotracheal tube 2 cm above the carina. Otherwise stable support apparatus. 2. Small bilateral pleural effusions with associated bibasilar opacity, which may reflect atelectasis or infiltrate. 3. Stable cardiomegaly with diffuse pulmonary vascular congestion without frank pulmonary edema, similar to prior. Electronically Signed By: Jeannine Boga M.D. On: 08/17/2013 06:36   Dg Chest Port 1 View  08/15/2013 CLINICAL DATA: Respiratory distress EXAM: PORTABLE CHEST - 1 VIEW COMPARISON: 08/14/2013 FINDINGS: Endotracheal tube is 1 cm above the carina. NG tube enters the stomach. Central venous catheter tip in the SVC is unchanged. Bibasilar consolidation unchanged. Small bilateral effusions unchanged. Pulmonary vascular congestion unchanged IMPRESSION: Endotracheal tube is just above the carina. Bibasilar consolidation and small effusions unchanged. Pulmonary vascular congestion unchanged. Electronically Signed By: Franchot Gallo M.D. On: 08/15/2013 08:38   Dg Chest Port 1 View  08/14/2013 CLINICAL DATA: Post intubation. EXAM: PORTABLE CHEST - 1 VIEW COMPARISON: 08/14/2013 FINDINGS: Endotracheal tube is roughly 2.1 cm above the carina. Again noted is enlargement of the cardiac silhouette. Enlarged interstitial lung markings are suggestive for edema. There are bibasilar densities which could represent a combination atelectasis  and pleural fluid. Left jugular central venous catheter tip is near the central left innominate vein. IMPRESSION: Endotracheal tube is appropriately positioned above the carina. Cardiomegaly with evidence for interstitial edema. Bibasilar densities as described. Electronically Signed By: Markus Daft M.D. On: 08/14/2013 09:46   Dg Chest Port 1 View  08/14/2013 CLINICAL DATA: Increasing shortness of breath. EXAM: PORTABLE CHEST - 1 VIEW COMPARISON: Chest x-ray from yesterday FINDINGS: Left IJ catheter in similar position  when accounting for distortion by rightward rotation. Persistent cardiopericardial enlargement. Increased hazy density of the chest, likely bilateral layering effusions. There is pulmonary venous congestion and probable edema. No new asymmetric opacity. No pneumothorax. IMPRESSION: 1. Pulmonary venous hypertension and probable layering pleural effusions. 2. Bibasilar atelectasis. Electronically Signed By: Jorje Guild M.D. On: 08/14/2013 04:09   Dg Chest Port 1 View  08/13/2013 CLINICAL DATA: Central line placement EXAM: PORTABLE CHEST - 1 VIEW COMPARISON: 08/12/2013 FINDINGS: Left jugular central venous catheter placement. Catheter tip in the SVC. No pneumothorax. Increase in bibasilar atelectasis. IMPRESSION: Central line placement in the SVC without pneumothorax Increase in bibasilar atelectasis. Electronically Signed By: Franchot Gallo M.D. On: 08/13/2013 10:24   Dg Chest Port 1 View  08/12/2013 CLINICAL DATA: Pre-op for buttock abscess surgery. EXAM: PORTABLE CHEST - 1 VIEW COMPARISON: DG CHEST 2 VIEW dated 06/07/2013; DG CHEST 2 VIEW dated 06/11/2012; DG CHEST 2 VIEW dated 03/17/2011 FINDINGS: 1618 hr. There are lower lung volumes with increased patchy perihilar opacities in the right lung. The left lung is clear. The heart size and mediastinal contours are stable. There is no pneumothorax or significant pleural effusion. The osseous structures appear unchanged. IMPRESSION: New patchy perihilar opacities in the right lung, likely atelectasis. No consolidation or significant changes demonstrated. Electronically Signed By: Camie Patience M.D. On: 08/12/2013 16:28   Dg Abd Portable 1v  08/14/2013 CLINICAL DATA: NG placement EXAM: PORTABLE ABDOMEN - 1 VIEW COMPARISON: None. FINDINGS: The patient is rotated on this study. With technique taking into consideration an NG tube is appreciated coursing in the expected region of the stomach. The tip projects in the expected region of the gastric antrum/duodenal bulb. Air  is seen within nondilated loops of small bowel. Degenerative changes are appreciated throughout the thoracolumbar spine. IMPRESSION: NG tube which appears coarse in expected region of the stomach. Electronically Signed By: Margaree Mackintosh M.D. On: 08/14/2013 15:59   Assessment/Plan 1. Necrotizing fasciitis of perineum -continue current wound care and keep f/u with surgery in 1 wk -completed course of abx 2. Rash and nonspecific skin eruption -felt to be due to her abx (?clindamycin) -completing steroid taper and has hydrocortisone topically, as well with some benefit  3. Acute on chronic kidney disease, stage 3 -improved prior to d/c -f/u bmp, avoid ace as this was on her allergies and she has now had a rash and acute renal failure with this--seems she may just do better without it at this point  4. DIABETES MELLITUS, TYPE II -on diet alone at this time Lab Results  Component Value Date   HGBA1C 7.7* 08/12/2013   5. Breast cancer, right breast-lobular s/p lumpectomy and sentinel node biopsy -keep f/u with oncology as scheduled -cont anastrozole  6. Acute on chronic diastolic CHF (congestive heart failure) -says she has previously been on lasix  -seems this is worse after receiving ivf, zosyn (high sodium load) for her septic shock--required bid dosing of lasix during hospitalization -will put on 2 liter fluid restriction, cont lasix daily, check daily weights, low sodium heart healthy diet  7.  Antibiotic associated diarrhea: - was c diff negative, but developed after broad spectrum use for her necrotizing fasciitis -cont to monitor frequency and volume status  Functional status:  Requiring assistance with bathing, dressing, transfers at this time, independent with meals  Family/ staff Communication: husband was present during visit  Labs/tests ordered:  Cbc, bmp next draw, f/u with surgery 1-2 wks

## 2013-09-03 ENCOUNTER — Encounter: Payer: Medicare Other | Admitting: Internal Medicine

## 2013-09-10 ENCOUNTER — Encounter: Payer: Self-pay | Admitting: Internal Medicine

## 2013-09-10 ENCOUNTER — Non-Acute Institutional Stay (SKILLED_NURSING_FACILITY): Payer: Medicare Other | Admitting: Internal Medicine

## 2013-09-10 DIAGNOSIS — R05 Cough: Secondary | ICD-10-CM | POA: Insufficient documentation

## 2013-09-10 DIAGNOSIS — I509 Heart failure, unspecified: Secondary | ICD-10-CM

## 2013-09-10 DIAGNOSIS — N183 Chronic kidney disease, stage 3 unspecified: Secondary | ICD-10-CM

## 2013-09-10 DIAGNOSIS — R059 Cough, unspecified: Secondary | ICD-10-CM | POA: Insufficient documentation

## 2013-09-10 DIAGNOSIS — N289 Disorder of kidney and ureter, unspecified: Secondary | ICD-10-CM

## 2013-09-10 DIAGNOSIS — K625 Hemorrhage of anus and rectum: Secondary | ICD-10-CM | POA: Insufficient documentation

## 2013-09-10 DIAGNOSIS — I5033 Acute on chronic diastolic (congestive) heart failure: Secondary | ICD-10-CM

## 2013-09-10 NOTE — Progress Notes (Signed)
Patient ID: [name], female   DOB: 1929-12-25, 80 y.o.   MRN: [id number]   this is an acute visit.  Level of care skilled.  [place].   Chief Complaint   Acute visit secondary to question rectal bleeding   .         HPI: 80 y.o. female with h/o DMII, hyperlipidemia, gout, morbid obesity, breast cancer on right, CKD3 was admitted here for short term rehab s/p a complicated hospitalization for debridement of necrotizing fasciitis of her right labia. This was performed on [id number], but was complicated by septic shock requiring intubation. She was placed on empiric vanc, zosyn, and clindamycin. She's had a foley in place to prevent irritation of the healing area. She was extubated 3/12. She also developed profuse diarrhea that has improved slowly but does still continue. She has been C diff toxin negative. She is on florastor. She also had difficulty with paroxysmal afib and diastolic heart failure. She was diuresed with lasix 40mg  po bid and this was then reduced to daily. She also had some acute on chronic renal failure that improved by d/c. Her ace inhibitor was stopped and it appears in her allergy list thought she was uncertain why. She had a rash felt to be abx-induced on her entire back and posterior arms and legs--it is less pruritic since being placed on a prednisone course  Apparently this morning nursing staff did note some blood in her stool-patient states this is not new and that she does have a history of internal hemorrhoids and actually went to the hospital sometime ago and was diagnosed with the internal hemorrhoids after an episode apparently fairly profuse bleeding.  She does not complaining of any increased shortness of breath chest pain or abdominal discomfort    .  ROS:  Review of Systems  Constitutional:. Negative for fever, chills and weight loss.  HENT: Negative for congestion.  Eyes: Negative for blurred vision.  Respiratory: Negative for shortness of  breath and wheezing--however says she does have a cough occasionally productive.  Still requiring oxygen  Cardiovascular: Positive for leg swelling. Negative for chest pain.  Notes improvement of edema  Gastrointestinal: . Negative for heartburn, nausea, vomiting, abdominal pain, constipation, positive for blood in stool-history of hemorrhoids. Also note a history of diverticulosis in the past Genitourinary: Negative for dysuria.  Foley in place  Musculoskeletal: Negative for falls and myalgias.  Skin: Positive for itching and rash.  Neurological: Positive for weakness. Negative for dizziness, loss of consciousness and headaches.  Endo/Heme/Allergies: Bruises/bleeds easily.  Psychiatric/Behavioral: Negative for depression and memory loss. The patient is nervous/anxious.  Past Medical History   Diagnosis  Date   .  Hypertension    .  Hyperlipidemia    .  Morbid obesity    .  Osteoarthritis    .  Gout    .  CVA (cerebral infarction)    .  IBS (irritable bowel syndrome)    .  Diverticulosis    .  Esophageal stricture    .  Colon polyps      hyperplastic   .  Anxiety    .  Hiatal hernia      4 cm   .  Shingles  02/21/2011   .  Skin cancer    .  Breast cancer      right breast   .  Dysrhythmia      dr bensimhon   .  Blood transfusion    .  Chronic kidney disease      occ uti's   .  GERD (gastroesophageal reflux disease)    .  PONV (postoperative nausea and vomiting)    .  DVT of leg (deep venous thrombosis)      left; S/P OR   .  Atrial flutter      "sometimes"   .  Bronchitis    .  Pneumonia      "walking"   .  Shortness of breath on exertion    .  Anemia    .  Stroke  2002     residual: "little tingling in palm of left hand"   .  Kidney stones  1990's   .  UTI (lower urinary tract infection)      "I've had a few"   .  History of radiation therapy  02/15/12-04/02/12     right breast 4680 cGy/26 sessions, right boost=1400cGy /7 sessions    Past Surgical History     Procedure  Laterality  Date   .  Abdominal hysterectomy     .  Bile duct stent placement   ~ 01/2011   .  Pelvic bone tumor removal       twice in 2000's   .  Colonic perforation repair   ~ 2000   .  Total hip arthroplasty   1980's     right   .  Joint replacement     .  Colon surgery       hole in intestine ,tumors?   .  Tonsillectomy   ~ 1946   .  Cholecystectomy   03/23/11     lap chole   .  Replacement total knee bilateral   ~ 2008; ~ 2010     right; left   .  Cataract extraction w/ intraocular lens implant, bilateral   1990's   .  Hemorrhoid surgery   2012   .  Breast lumpectomy   06/29/11     right   .  Breast lumpectomy   06/29/2011     Procedure: BREAST LUMPECTOMY WITH EXCISION OF SENTINEL NODE; Surgeon: Pedro Earls, MD; Location: [facility]; Service: General; Laterality: Right; right sentinel node mapping,right sentinel node biopsy, needle localization right breast lumpectomy   .  Incision and drainage breast abscess       right breast seroma   .  Incision and drainage abscess  N/A  08/12/2013     Procedure: INCISION AND DRAINAGE ABSCESS; Surgeon: Earnstine Regal, MD; Location: [facility]; Service: General; Laterality: N/A;   .  Irrigation and debridement abscess  N/A  08/14/2013     Procedure: Debridment of perineal tissue and debridement of perineal wound; Surgeon: Earnstine Regal, MD; Location: [facility]; Service: General; Laterality: N/A;   .  Irrigation and debridement abscess  N/A  08/16/2013     Procedure: DRESSING CHANGE AND DEBRIDEMENT OF PERINEUM WOUND; Surgeon: Earnstine Regal, MD; Location: [facility]; Service: General; Laterality: N/A;   .  Pilonidal cyst drainage  N/A  08/19/2013     Procedure: Dressing change perineum; Surgeon: Ralene Ok, MD; Location: [facility]; Service: General; Laterality: N/A;   .  Minor application of wound vac  N/A  08/20/2013     Procedure: MINOR APPLICATION OF WOUND VAC; Surgeon: Ralene Ok, MD; Location: [facility]; Service: General; Laterality: N/A;   .   Incision and drainage abscess  N/A  08/20/2013     Procedure: INCISION AND DRAINAGE ABSCESS; Surgeon: Anne Hahn  Rosendo Gros, MD; Location: [facility]; Service: General; Laterality: N/A;   Social History:  reports that she has never smoked. She has never used smokeless tobacco. She reports that she does not drink alcohol or use illicit drugs.  Family History   Problem  Relation  Age of Onset   .  Breast cancer  Sister      x 2   .  Diabetes  Sister    .  Prostate cancer  Brother    .  Colon cancer  Neg Hx    .  Stroke  Mother    .  Kidney failure  Sister    .  Heart disease  Paternal Uncle      multiple   .  Heart disease  Maternal Uncle      multiple   Medications:  Patient's Medications   New Prescriptions    No medications on file   Previous Medications    ALBUTEROL (PROVENTIL) (2.5 MG/3ML) 0.083% NEBULIZER SOLUTION  Take 3 mLs (2.5 mg total) by nebulization every 6 (six) hours.    ALLOPURINOL (ZYLOPRIM) 100 MG TABLET  Take 100 mg by mouth every other day.    ANASTROZOLE (ARIMIDEX) 1 MG TABLET  Take 1 mg by mouth every morning.    ASPIRIN 325 MG EC TABLET  Take 325 mg by mouth every other day.    ASPIRIN EC 81 MG TABLET  Take 243 mg by mouth every other day.    CHOLECALCIFEROL (VITAMIN D) 1000 UNITS TABLET  Take 1,000 Units by mouth every morning.    FAMOTIDINE (PEPCID) 20 MG TABLET  Take 1 tablet (20 mg total) by mouth daily.    FUROSEMIDE (LASIX) 40 MG TABLET  Take 40 mg by mouth every morning.    HYDROCORTISONE CREAM 1 %  Apply topically 2 (two) times daily.    POLYVINYL ALCOHOL (LIQUIFILM TEARS) 1.4 % OPHTHALMIC SOLUTION  Place 2 drops into both eyes daily as needed for dry eyes.    PREDNISONE (DELTASONE) 20 MG TABLET  Take 1.5 tablets (30 mg total) by mouth daily with breakfast.    SACCHAROMYCES BOULARDII (FLORASTOR) 250 MG CAPSULE  Take 1 capsule (250 mg total) by mouth 2 (two) times daily.    TRAMADOL-ACETAMINOPHEN (ULTRACET) 37.5-325 MG PER TABLET  Take one tablet by mouth every 6  hours as needed for pain   Modified Medications    No medications on file   Discontinued Medications    No medications on file   Physical Exam:  Physical Exam  Temperature 96.8 pulse 72 respirations 16 blood pressure 135/65 Constitutional: She is oriented to person, place, and time. She appears well-developed and well-nourished. No distress.  HENT:  Head: Normocephalic and atraumatic.   l.  Mouth/Throat: Oropharynx is clear and moist. No oropharyngeal exudate.  Eyes: Conjunctivae and EOM are normal. Pupils are equal, round, and reactive to light.  Cardiovascular: Normal rate, regular   rhythm, normal heart sounds   Pulmonary/Chest: Effort normal and breath sounds normal. She has no rales--somewhat decreased air entry no labored breathing--some coughing at times with deep inspiration.  Wearing O2 2L  Abdominal: Soft. Bowel sounds are normal. She exhibits no distension and no mass. There is no tenderness. Rectal-small amount of bleeding noted from the rectum-  Genitourinary:  Dressing in place covering the surgical site,--Foley catheter draining amber colored urine  Musculoskeletal: Normal range of motion. She exhibits edema. She exhibits no tenderness.  1+ edema  Neurological: She is alert and oriented to person,  place, and time. No cranial nerve deficit.  Skin: Skin is warm and dry.  Macular rash over entire back, posterior arms, legs  Psychiatric: She has a normal mood and affect.   Labs reviewed:    Basic Metabolic Panel:  Recent Labs   08/13/13 0310  08/14/13 0351  08/15/13 0215   08/23/13 0453   08/27/13 0335  08/28/13 0620  08/29/13 0625   NA  138  141  145  < >  145  < >  144  143  142   K  4.6  4.0  4.0  < >  3.4*  < >  4.0  3.9  4.3   CL  103  107  113*  < >  105  < >  96  94*  95*   CO2  24  20  21   < >  30  < >  40*  42*  39*   GLUCOSE  171*  118*  139*  < >  119*  < >  114*  93  181*   BUN  53*  43*  42*  < >  16  < >  13  16  18    CREATININE  1.57*  1.28*   1.30*  < >  0.94  < >  1.35*  1.57*  1.34*   CALCIUM  8.0*  8.4  8.6  < >  8.8  < >  9.2  9.2  9.4   MG  --  1.8  1.7  --  1.6  --  --  --  --   PHOS  --  3.4  3.1  --  --  --  --  --  --   < > = values in this interval not displayed.  Liver Function Tests:  Recent Labs   01/29/13 1411  08/26/13 0547   AST  49*  19   ALT  37  10   ALKPHOS  104  56   BILITOT  0.68  0.5   PROT  7.1  6.0   ALBUMIN  3.1*  2.1*   CBC:  Recent Labs   01/29/13 1411  08/12/13 1330   08/26/13 0547  08/27/13 0335  08/28/13 0620   WBC  6.9  17.9*  < >  10.3  10.1  10.5   NEUTROABS  4.5  15.1*  --  --  --  --   HGB  13.5  12.7  < >  9.6*  10.0*  10.1*   HCT  41.7  39.5  < >  31.6*  32.4*  33.1*   MCV  91.6  93.6  < >  95.5  96.1  97.6   PLT  165  187  < >  317  321  292   < > = values in this interval not displayed.  CBG:  Recent Labs   08/27/13 1211  08/27/13 1702  08/28/13 0756   GLUCAP  168*  120*  86   Imaging and Procedures:  Dg Chest Port 1 View  08/27/2013 CLINICAL DATA: Respiratory failure and dyspnea EXAM: PORTABLE CHEST - 1 VIEW COMPARISON: DG CHEST 1V PORT dated 08/26/2013 FINDINGS: The lungs are reasonably well inflated. The interstitial markings remain increased but are slightly less conspicuous today. The hemidiaphragms remain obscured. The cardiopericardial silhouette remains enlarged and the pulmonary vascularity remains engorged. The pulmonary interstitial markings are perhaps slightly less conspicuous today. The left internal jugular venous catheter tip lies in  the region of the proximal SVC. IMPRESSION: There has been slight interval improvement in the appearance of the pulmonary interstitium since yesterday's study suggesting interval decrease in interstitial edema. Considerable abnormality remains however. Electronically Signed By: David Martinique On: 08/27/2013 07:59  Dg Chest Port 1 View  08/26/2013 CLINICAL DATA: Respiratory failure. EXAM: PORTABLE CHEST - 1 VIEW COMPARISON: Chest  x-ray 08/25/2013. FINDINGS: There is a left-sided internal jugular central venous catheter with tip terminating in the mid superior vena cava. Lung volumes are low. There is cephalization of the pulmonary vasculature and slight indistinctness of the interstitial markings suggestive of mild pulmonary edema. Small to moderate bilateral pleural effusions. Mild cardiomegaly. The patient is rotated to the left on today's exam, resulting in distortion of the mediastinal contours and reduced diagnostic sensitivity and specificity for mediastinal pathology. Atherosclerosis in the thoracic aorta. IMPRESSION: 1. Support apparatus, as above. 2. Low lung volumes with evidence of congestive heart failure, as above. Electronically Signed By: Vinnie Langton M.D. On: 08/26/2013 07:09  Dg Chest Port 1 View  08/25/2013 CLINICAL DATA: Respiratory failure EXAM: PORTABLE CHEST - 1 VIEW COMPARISON: 08/24/2013 FINDINGS: Left jugular central venous line is again seen and stable. Cardiac shadow is stable. Bilateral pleural effusions are noted as well as a degree of vascular congestion the overall appearance is stable from the prior study. IMPRESSION: No significant interval change from the prior exam. Electronically Signed By: Inez Catalina M.D. On: 08/25/2013 07:32  Dg Chest Port 1 View  08/24/2013 CLINICAL DATA: Followup atelectasis EXAM: PORTABLE CHEST - 1 VIEW COMPARISON: 08/23/2013 FINDINGS: Progression of right lower lobe airspace consolidation. There is a moderate right effusion. Left lower lobe consolidation and small left effusion are similar. There is vascular congestion with probable mild edema Left jugular catheter tip in the SVC. IMPRESSION: Findings consistent with congestive heart failure with edema and bilateral effusions. Progression of right lower lobe consolidation which may be atelectasis or infiltrate. Electronically Signed By: Franchot Gallo M.D. On: 08/24/2013 06:43  Dg Chest Port 1 View  08/23/2013 CLINICAL DATA:  Extubation. EXAM: PORTABLE CHEST - 1 VIEW COMPARISON: DG CHEST 1V PORT dated 08/22/2013; DG CHEST 1V PORT dated 08/21/2013 FINDINGS: Interim extubation. Left IJ line in stable position. Interim removal of NG tube. Progressive right lower lobe atelectasis is present. Cardiomegaly with pulmonary vascular prominence and interstitial prominence with bilateral pleural effusions. Findings consistent congestive heart failure. No pneumothorax. IMPRESSION: 1. Interim extubation and removal of NG tube. Left IJ line in stable position. 2. Severe right lower lobe atelectasis and consolidation. 3. Congestive heart failure with pulmonary interstitial edema and bilateral pleural effusions. Electronically Signed By: [place] On: 08/23/2013 08:00  Dg Chest Port 1 View  08/22/2013 CLINICAL DATA Evaluate infiltrates. EXAM PORTABLE CHEST - 1 VIEW COMPARISON DG CHEST 1V PORT dated 08/21/2013 FINDINGS Endotracheal tube is roughly 2.7 cm above the carina. Nasogastric tube extends into the abdomen. Patchy interstitial densities bilaterally have not changed. Persistent densities at both lung bases are concerning for areas of consolidation and pleural fluid. Heart appears to be upper limits of normal for size. Patient is rotated towards the right on the study. There is a left jugular central venous catheter near the upper SVC but poorly evaluated. Negative for a pneumothorax. IMPRESSION Diffuse interstitial densities are suggestive for pulmonary edema. Suspect bilateral pleural effusions with basilar consolidation or atelectasis. Support apparatuses as described. SIGNATURE Electronically Signed By: Markus Daft M.D. On: 08/22/2013 06:54  Dg Chest Port 1 View  08/21/2013 CLINICAL DATA Follow-up atelectasis. EXAM PORTABLE  CHEST - 1 VIEW COMPARISON DG CHEST 1V PORT dated 08/20/2013 FINDINGS The cardiac silhouette appears mild to moderately enlarged, similar. Mediastinal silhouette is nonsuspicious, mildly calcified aortic knob. Similar mild  interstitial prominence, with bibasilar airspace opacities and small pleural effusion, similar. Strandy midlung zone densities likely reflect atelectasis. No pneumothorax. Endotracheal tube tip projects 2.4 cm above the carina. Left internal jugular central venous catheter with distal tip approximating the proximal superior vena cava. Nasogastric tube past the GE junction the distal tip not imaged. Multiple EKG lines overlie the patient and may obscure subtle underlying pathology. IMPRESSION No apparent change in life-support lines. Stable cardiomegaly, interstitial prominence likely reflects pulmonary edema with bibasilar airspace opacities, midlung zone atelectasis and small pleural effusions, similar. SIGNATURE Electronically Signed By: Elon Alas On: 08/21/2013 06:55  Dg Chest Port 1 View  08/20/2013 CLINICAL DATA: Hypoxia EXAM: PORTABLE CHEST - 1 VIEW COMPARISON: Study obtained earlier in the day FINDINGS: Endotracheal tube tip is 2.4 cm above the carina. Nasogastric tube in side-port extend below the diaphragm. Central catheter tip is in the superior vena cava just beyond the junction with the left innominate vein. No pneumothorax. There is cardiomegaly with bilateral effusions and patchy left and to a lesser extent right mid lung atelectatic change. There is no new opacity. IMPRESSION: Tube and catheter positions as described without pneumothorax. Evidence of a degree of underlying congestive heart failure. Patchy mid lung atelectatic change bilaterally, stable. Electronically Signed By: Lowella Grip M.D. On: 08/20/2013 07:58  Dg Chest Port 1 View  08/20/2013 CLINICAL DATA: Evaluate lung fields. EXAM: PORTABLE CHEST - 1 VIEW COMPARISON: DG CHEST 1V PORT dated 08/18/2013 FINDINGS: Endotracheal tube and enteric tube appear unchanged. The left IJ central line has been advanced and the redundant loop in the neck is no longer visualized. The tip of the left IJ central line is at the confluence of the  brachiocephalic veins. Bilateral pleural effusions, left-greater-than-right basilar predominant airspace disease and basilar predominant atelectasis is unchanged compared to prior. Cardiopericardial silhouette unchanged. Patient is rotated to the right. IMPRESSION: 1. Adjustment of support apparatus detailed above. 2. Unchanged appearance of the heart and lungs. Electronically Signed By: Dereck Ligas M.D. On: 08/20/2013 07:48  Dg Chest Port 1 View  08/18/2013 CLINICAL DATA: Endotracheal tube placement EXAM: PORTABLE CHEST - 1 VIEW COMPARISON: Prior radiograph from 08/17/2013 FINDINGS: Tip of the endotracheal tube is positioned approximately 1.7 cm above the carina. Enteric tube courses into the abdomen. Left IJ approach central venous catheter is stable in position. The catheter is coiled near its insertion site in the lower left neck. Cardiomegaly is stable. There has been slight interval worsening in diffuse pulmonary vascular congestion with indistinctness of the interstitial markings, suggestive of progressive edema. Small moderate bilateral pleural effusions with associated bibasilar opacities are similar. No pneumothorax. No definite new focal infiltrate. Osseous structures are unchanged. IMPRESSION: 1. Tip of the endotracheal tube 1.7 cm above the carina. 2. Slight interval worsening in diffuse pulmonary vascular congestion with indistinctness of the interstitial markings, suggesting worsened edema. 3. Similar bilateral pleural effusions with associated bibasilar opacities, likely atelectasis. Electronically Signed By: Jeannine Boga M.D. On: 08/18/2013 06:18  Dg Chest Port 1 View  08/17/2013 CLINICAL DATA: Intubation EXAM: PORTABLE CHEST - 1 VIEW COMPARISON: Prior radiograph from 08/15/2013 FINDINGS: Tip of the endotracheal tube is positioned approximately 2 cm above the carina. Left IJ approach system venous catheter is gross stable. Enteric tube courses into the abdomen. Cardiomegaly is unchanged.  Bilateral pleural effusions with associated bibasilar  opacities are not significantly changed. Diffuse pulmonary vascular congestion without frank pulmonary edema is similar. No pneumothorax. Osseous structures are unchanged. IMPRESSION: 1. Tip of the endotracheal tube 2 cm above the carina. Otherwise stable support apparatus. 2. Small bilateral pleural effusions with associated bibasilar opacity, which may reflect atelectasis or infiltrate. 3. Stable cardiomegaly with diffuse pulmonary vascular congestion without frank pulmonary edema, similar to prior. Electronically Signed By: Jeannine Boga M.D. On: 08/17/2013 06:36  Dg Chest Port 1 View  08/15/2013 CLINICAL DATA: Respiratory distress EXAM: PORTABLE CHEST - 1 VIEW COMPARISON: 08/14/2013 FINDINGS: Endotracheal tube is 1 cm above the carina. NG tube enters the stomach. Central venous catheter tip in the SVC is unchanged. Bibasilar consolidation unchanged. Small bilateral effusions unchanged. Pulmonary vascular congestion unchanged IMPRESSION: Endotracheal tube is just above the carina. Bibasilar consolidation and small effusions unchanged. Pulmonary vascular congestion unchanged. Electronically Signed By: Franchot Gallo M.D. On: 08/15/2013 08:38  Dg Chest Port 1 View  08/14/2013 CLINICAL DATA: Post intubation. EXAM: PORTABLE CHEST - 1 VIEW COMPARISON: 08/14/2013 FINDINGS: Endotracheal tube is roughly 2.1 cm above the carina. Again noted is enlargement of the cardiac silhouette. Enlarged interstitial lung markings are suggestive for edema. There are bibasilar densities which could represent a combination atelectasis and pleural fluid. Left jugular central venous catheter tip is near the central left innominate vein. IMPRESSION: Endotracheal tube is appropriately positioned above the carina. Cardiomegaly with evidence for interstitial edema. Bibasilar densities as described. Electronically Signed By: Markus Daft M.D. On: 08/14/2013 09:46  Dg Chest Port 1 View    08/14/2013 CLINICAL DATA: Increasing shortness of breath. EXAM: PORTABLE CHEST - 1 VIEW COMPARISON: Chest x-ray from yesterday FINDINGS: Left IJ catheter in similar position when accounting for distortion by rightward rotation. Persistent cardiopericardial enlargement. Increased hazy density of the chest, likely bilateral layering effusions. There is pulmonary venous congestion and probable edema. No new asymmetric opacity. No pneumothorax. IMPRESSION: 1. Pulmonary venous hypertension and probable layering pleural effusions. 2. Bibasilar atelectasis. Electronically Signed By: Jorje Guild M.D. On: 08/14/2013 04:09  Dg Chest Port 1 View  08/13/2013 CLINICAL DATA: Central line placement EXAM: PORTABLE CHEST - 1 VIEW COMPARISON: 08/12/2013 FINDINGS: Left jugular central venous catheter placement. Catheter tip in the SVC. No pneumothorax. Increase in bibasilar atelectasis. IMPRESSION: Central line placement in the SVC without pneumothorax Increase in bibasilar atelectasis. Electronically Signed By: Franchot Gallo M.D. On: 08/13/2013 10:24  Dg Chest Port 1 View  08/12/2013 CLINICAL DATA: Pre-op for buttock abscess surgery. EXAM: PORTABLE CHEST - 1 VIEW COMPARISON: DG CHEST 2 VIEW dated 06/07/2013; DG CHEST 2 VIEW dated 06/11/2012; DG CHEST 2 VIEW dated 03/17/2011 FINDINGS: 1618 hr. There are lower lung volumes with increased patchy perihilar opacities in the right lung. The left lung is clear. The heart size and mediastinal contours are stable. There is no pneumothorax or significant pleural effusion. The osseous structures appear unchanged. IMPRESSION: New patchy perihilar opacities in the right lung, likely atelectasis. No consolidation or significant changes demonstrated. Electronically Signed By: Camie Patience M.D. On: 08/12/2013 16:28  Dg Abd Portable 1v  08/14/2013 CLINICAL DATA: NG placement EXAM: PORTABLE ABDOMEN - 1 VIEW COMPARISON: None. FINDINGS: The patient is rotated on this study. With technique taking into  consideration an NG tube is appreciated coursing in the expected region of the stomach. The tip projects in the expected region of the gastric antrum/duodenal bulb. Air is seen within nondilated loops of small bowel. Degenerative changes are appreciated throughout the thoracolumbar spine. IMPRESSION: NG tube which appears coarse in expected  region of the stomach. Electronically Signed By: Margaree Mackintosh M.D. On: 08/14/2013 15:59  Assessment/Plan   #1-rectal bleeding-this appears to be fairly minimal at this time-hemorrhoids could be the etiology but will guaiac stools x3 starting tomorrow to see if this persists -- also obtain a stat CBC and metabolic panel.  Monitor vital signs pulse ox every shift for 72 hours to keep an eye on this as well.  She does continue on aspirin as well as a PPI again will await lab work  And monitor clinical status   #2-cough-the this has been going on for a while occasionally productive Will obtain a chest x-ray to rule out any pathology also Mucinex 600 mg twice a day for 5 days and monitor vital signs as noted above   3. Necrotizing fasciitis of perineum  -continue current wound care and keep f/u with surgery   -completed course of abx   t  4. Acute on chronic kidney disease, stage 3  -improved prior to d/c  -f/u bmp, avoid ace as this was on her allergies and she  had a rash and acute renal failure with this--seems she may just do better without it at this point per Dr. Cyndi Lennert assessment--we'll update BMP 4. DIABETES MELLITUS, TYPE II  -on diet alone at this time  Lab Results   Component  Value  Date    HGBA1C  7.7*  08/12/2013     5. Acute on chronic diastolic CHF (congestive heart failure)  -says she has previously been on lasix  -seems this is worse after receiving ivf, zosyn (high sodium load) for her septic shock--required bid dosing of lasix during hospitalization  -t on 2 liter fluid restriction, cont lasix daily, , low sodium heart healthy diet    [phone]

## 2013-09-12 ENCOUNTER — Ambulatory Visit (INDEPENDENT_AMBULATORY_CARE_PROVIDER_SITE_OTHER): Payer: Medicare Other | Admitting: Surgery

## 2013-09-12 ENCOUNTER — Encounter (INDEPENDENT_AMBULATORY_CARE_PROVIDER_SITE_OTHER): Payer: Self-pay | Admitting: Surgery

## 2013-09-12 VITALS — BP 150/80 | HR 80 | Temp 97.3°F | Resp 16 | Wt 233.0 lb

## 2013-09-12 DIAGNOSIS — M726 Necrotizing fasciitis: Secondary | ICD-10-CM

## 2013-09-12 DIAGNOSIS — T148XXD Other injury of unspecified body region, subsequent encounter: Secondary | ICD-10-CM

## 2013-09-12 DIAGNOSIS — T07XXXA Unspecified multiple injuries, initial encounter: Secondary | ICD-10-CM

## 2013-09-12 NOTE — Patient Instructions (Signed)
See handwritten sheet for facility.  Earnstine Regal, MD, [facility] Office: [phone]

## 2013-09-12 NOTE — Progress Notes (Signed)
General Surgery [facility]  Chief Complaint  Patient presents with  . Routine Post Op    necrotizing fasciitis of perineum    HISTORY: Patient is an 80 year old female who was managed on the general surgery service as an inpatient for necrotizing fasciitis of the perineum. She required multiple trips to the operating room for debridement and wound care. She required management by multiple specialty teems during her hospital stay.  Patient was discharged to rehabilitation facility in approximately 2 weeks ago. They are performing wound care. She returns today for assessment of her perineal wound.  PERTINENT REVIEW OF SYSTEMS: Minimal drainage. Minimal pain. Foley catheter remains in place.  EXAM: HEENT: normocephalic; pupils equal and reactive; sclerae clear; dentition good; mucous membranes moist NECK:  symmetric on extension; no palpable anterior or posterior cervical lymphadenopathy; no supraclavicular masses; no tenderness CHEST: Coarse breath sounds bilaterally ABD:  Soft, nontender without distention EXT:  non-tender without edema; no deformity GU:  Perineal wound extends from the pubis down the lateral right labia to the anterior buttock; wound is clean throughout with healthy granulation tissue and minimal drainage; dressing is replaced with saline moistened Kerlix gauze wet to dry with ABD pads as dressing   IMPRESSION: Necrotizing fasciitis of perineum, wound healing by secondary intention  PLAN: Patient should continue to have daily wound care with wet-to-dry dressing changes and tub soaks or sitz baths concurrently.  Patient will return in 6 weeks for wound check.  Foley catheter could be removed at this time from a surgical wound care standpoint.  Earnstine Regal, MD, [facility] Office: [phone]  Visit Diagnoses: 1. Necrotizing fasciitis of perineum   2. Wound healing, delayed

## 2013-09-24 ENCOUNTER — Telehealth (INDEPENDENT_AMBULATORY_CARE_PROVIDER_SITE_OTHER): Payer: Self-pay | Admitting: General Surgery

## 2013-09-24 NOTE — Telephone Encounter (Signed)
Patient called in explaining that she will be release from her rehab facility this Thursday.  She explained that they have been discussing with her that they would like to keep her there longer, but the patient would like to be able to go home and have home health come out and do her dressing change QD.  I informed her that I would send this message to Dr. Harlow Asa so he knows  The patient's wishes, and that her rehab facility will get in touch with [place] about extending orders and then we would discuss further evaluation.  She verbalized understanding of this process.

## 2013-09-25 ENCOUNTER — Non-Acute Institutional Stay (SKILLED_NURSING_FACILITY): Payer: Medicare Other | Admitting: Internal Medicine

## 2013-09-25 ENCOUNTER — Encounter: Payer: Self-pay | Admitting: Internal Medicine

## 2013-09-25 DIAGNOSIS — I5033 Acute on chronic diastolic (congestive) heart failure: Secondary | ICD-10-CM

## 2013-09-25 DIAGNOSIS — R21 Rash and other nonspecific skin eruption: Secondary | ICD-10-CM

## 2013-09-25 DIAGNOSIS — M726 Necrotizing fasciitis: Secondary | ICD-10-CM

## 2013-09-25 DIAGNOSIS — E119 Type 2 diabetes mellitus without complications: Secondary | ICD-10-CM

## 2013-09-25 DIAGNOSIS — I509 Heart failure, unspecified: Secondary | ICD-10-CM

## 2013-09-25 NOTE — Progress Notes (Signed)
Patient ID: [name], female   DOB: 29-Aug-1929, 80 y.o.   MRN: [id number]   this is an acute visit.  Level of care skilled.  [place].  Chief Complaint   Discharge note   .         HPI: 80 y.o. female with h/o DMII, hyperlipidemia, gout, morbid obesity, breast cancer on right, CKD3 was admitted here for short term rehab s/p a complicated hospitalization for debridement of necrotizing fasciitis of her right labia. This was performed on [id number], but was complicated by septic shock requiring intubation. She was placed on empiric vanc, zosyn, and clindamycin. She's had a foley in place to prevent irritation of the healing area. She was extubated 3/12. She also developed profuse diarrhea that apparently has resolved. She has been C diff toxin negative. She is on florastor. She also had difficulty with paroxysmal afib and diastolic heart failure. She was diuresed with lasix 40mg  po bid and this was then reduced to daily. She also had some acute on chronic renal failure that improved by d/c--. Her ace inhibitor was stopped and it appears in her allergy list thought she was uncertain why. She had a rash felt to be abx-induced on her entire back and posterior arms and legs--this has improved on prednisone  Apparently about 2 weeks ago nursing staff did note some blood in her stool-patient states this is not new and that she does have a history of internal hemorrhoids and actually went to the hospital sometime ago and was diagnosed with the internal hemorrhoids after an episode apparently fairly profuse bleeding.--There's been no further reports of rectal bleeding her hemoglobin has been stable  She does not complaining of any increased shortness of breath chest pain or abdominal discomfort--she has an indwelling Foley catheter for wound protection per surgical noted appears they thought this might be able to be removed but wound care physicia nin. facility I believe was hesitant to do this   He  will be going home with her husband who is very supportive and is with her here most of the time it appears.  She will need home health nursing support certainly for wound followup as well as a appointment with the surgeon.  Also will need PT and OT for strengthening she does ambulate with a walker but still has weakness .  ROS:  Review of Systems  Constitutional:. Negative for fever, chills and weight loss.  HENT: Negative for congestion.  Eyes: Negative for blurred vision.  Respiratory: Negative for shortness of breath and wheezing--   Still requiring oxygen  Cardiovascular: Positive for leg swelling although this appears to be stable to improved-. Negative for chest pain.  Notes improvement of edema  Gastrointestinal: . Negative for heartburn, nausea, vomiting, abdominal pain, constipation, positive for blood in stool-history of hemorrhoids. Also note a history of diverticulosis in the past  Genitourinary: Negative for dysuria.  Foley in place  Musculoskeletal: Negative for falls and myalgias.  Skin: Positive for itching and rash.-- again this appears to be improving  Neurological: Positive for weakness. Negative for dizziness, loss of consciousness and headaches.  Endo/Heme/Allergies: Bruises/bleeds easily.  Psychiatric/Behavioral: Negative for depression and memory loss. .  Past Medical History   Diagnosis  Date   .  Hypertension    .  Hyperlipidemia    .  Morbid obesity    .  Osteoarthritis    .  Gout    .  CVA (cerebral infarction)    .  IBS (  irritable bowel syndrome)    .  Diverticulosis    .  Esophageal stricture    .  Colon polyps      hyperplastic   .  Anxiety    .  Hiatal hernia      4 cm   .  Shingles  02/21/2011   .  Skin cancer    .  Breast cancer      right breast   .  Dysrhythmia      dr bensimhon   .  Blood transfusion    .  Chronic kidney disease      occ uti's   .  GERD (gastroesophageal reflux disease)    .  PONV (postoperative nausea and  vomiting)    .  DVT of leg (deep venous thrombosis)      left; S/P OR   .  Atrial flutter      "sometimes"   .  Bronchitis    .  Pneumonia      "walking"   .  Shortness of breath on exertion    .  Anemia    .  Stroke  2002     residual: "little tingling in palm of left hand"   .  Kidney stones  1990's   .  UTI (lower urinary tract infection)      "I've had a few"   .  History of radiation therapy  02/15/12-04/02/12     right breast 4680 cGy/26 sessions, right boost=1400cGy /7 sessions    Past Surgical History   Procedure  Laterality  Date   .  Abdominal hysterectomy     .  Bile duct stent placement   ~ 01/2011   .  Pelvic bone tumor removal       twice in 2000's   .  Colonic perforation repair   ~ 2000   .  Total hip arthroplasty   1980's     right   .  Joint replacement     .  Colon surgery       hole in intestine ,tumors?   .  Tonsillectomy   ~ 1946   .  Cholecystectomy   03/23/11     lap chole   .  Replacement total knee bilateral   ~ 2008; ~ 2010     right; left   .  Cataract extraction w/ intraocular lens implant, bilateral   1990's   .  Hemorrhoid surgery   2012   .  Breast lumpectomy   06/29/11     right   .  Breast lumpectomy   06/29/2011     Procedure: BREAST LUMPECTOMY WITH EXCISION OF SENTINEL NODE; Surgeon: Pedro Earls, MD; Location: [place]; Service: General; Laterality: Right; right sentinel node mapping,right sentinel node biopsy, needle localization right breast lumpectomy   .  Incision and drainage breast abscess       right breast seroma   .  Incision and drainage abscess  N/A  08/12/2013     Procedure: INCISION AND DRAINAGE ABSCESS; Surgeon: Earnstine Regal, MD; Location: [facility]; Service: General; Laterality: N/A;   .  Irrigation and debridement abscess  N/A  08/14/2013     Procedure: Debridment of perineal tissue and debridement of perineal wound; Surgeon: Earnstine Regal, MD; Location: [facility]; Service: General; Laterality: N/A;   .  Irrigation and debridement  abscess  N/A  08/16/2013     Procedure: DRESSING CHANGE AND DEBRIDEMENT OF PERINEUM WOUND; Surgeon: Sherren Mocha  Leeanne Mannan, MD; Location: [facility]; Service: General; Laterality: N/A;   .  Pilonidal cyst drainage  N/A  08/19/2013     Procedure: Dressing change perineum; Surgeon: Ralene Ok, MD; Location: [facility]; Service: General; Laterality: N/A;   .  Minor application of wound vac  N/A  08/20/2013     Procedure: MINOR APPLICATION OF WOUND VAC; Surgeon: Ralene Ok, MD; Location: [facility]; Service: General; Laterality: N/A;   .  Incision and drainage abscess  N/A  08/20/2013     Procedure: INCISION AND DRAINAGE ABSCESS; Surgeon: Ralene Ok, MD; Location: [facility]; Service: General; Laterality: N/A;   Social History:  reports that she has never smoked. She has never used smokeless tobacco. She reports that she does not drink alcohol or use illicit drugs.  Family History   Problem  Relation  Age of Onset   .  Breast cancer  Sister      x 2   .  Diabetes  Sister    .  Prostate cancer  Brother    .  Colon cancer  Neg Hx    .  Stroke  Mother    .  Kidney failure  Sister    .  Heart disease  Paternal Uncle      multiple   .  Heart disease  Maternal Uncle      multiple   Medications:  Patient's Medications   New Prescriptions    No medications on file   Previous Medications    ALBUTEROL (PROVENTIL) (2.5 MG/3ML) 0.083% NEBULIZER SOLUTION  Take 3 mLs (2.5 mg total) by nebulization every 6 (six) hours.    ALLOPURINOL (ZYLOPRIM) 100 MG TABLET  Take 100 mg by mouth every other day.    ANASTROZOLE (ARIMIDEX) 1 MG TABLET  Take 1 mg by mouth every morning.    ASPIRIN 325 MG EC TABLET  Take 325 mg by mouth every other day.    ASPIRIN EC 81 MG TABLET  Take 243 mg by mouth every other day.    CHOLECALCIFEROL (VITAMIN D) 1000 UNITS TABLET  Take 1,000 Units by mouth every morning.    FAMOTIDINE (PEPCID) 20 MG TABLET  Take 1 tablet (20 mg total) by mouth daily.    FUROSEMIDE (LASIX) 40 MG TABLET  Take 40 mg by  mouth every morning.    HYDROCORTISONE CREAM 1 %  Apply topically 2 (two) times daily.    POLYVINYL ALCOHOL (LIQUIFILM TEARS) 1.4 % OPHTHALMIC SOLUTION  Place 2 drops into both eyes daily as needed for dry eyes.    PREDNISONE (DELTASONE) 20 MG TABLET  Take 1.5 tablets (30 mg total) by mouth daily with breakfast.    SACCHAROMYCES BOULARDII (FLORASTOR) 250 MG CAPSULE  Take 1 capsule (250 mg total) by mouth 2 (two) times daily.    TRAMADOL-ACETAMINOPHEN (ULTRACET) 37.5-325 MG PER TABLET  Take one tablet by mouth every 6 hours as needed for pain   Modified Medications    No medications on file   Discontinued Medications    No medications on file   Physical Exam:  Physical Exam  Temperature 98.0 pulse 81 respirations 18 blood pressure 110/47 Constitutional: She is oriented to person, place, and time. She appears well-developed and well-nourished. No distress.  HENT:  Head: Normocephalic and atraumatic.   l.  Mouth/Throat: Oropharynx is clear and moist. No oropharyngeal exudate.  Eyes: Conjunctivae and EOM are normal. Pupils are equal, round, and reactive to light.  Cardiovascular: Normal rate, regular rhythm, normal heart sounds  Pulmonary/Chest: Effort normal and breath sounds normal. She has no rales.  Wearing O2 2L  Abdominal: Soft. Bowel sounds are normal. She exhibits no distension and no mass. There is no tenderness.  Rectal-small amount of bleeding noted from the rectum-  Genitourinary:  Dressing in place covering the surgical site,--Foley catheter draining amber colored urine  Musculoskeletal: Normal range of motion. She exhibits mild edema. She exhibits no tenderness.  Trace edema--she is able to ambulate with a rolling walker but still has some weakness  Neurological: She is alert and oriented to person, place, and time. No cranial nerve deficit.  Skin: Skin is warm and dry.  Macular rash appears significantly improved small amount  resides on her lower legs  Psychiatric: She has  a normal mood and affect.  Labs reviewed:  09/17/2013.  WBC-8.1 hemoglobin 12.6 platelets 209  Sodium 141 potassium 3.5 BUN 5 creatinine 1.0 09/11/2013.  WBC 9.9 hemoglobin 11.9 platelets 271.  Sodium 141 potassium 4.3 BUN 39 creatinine 1.25.   Basic Metabolic Panel:  Recent Labs   08/13/13 0310  08/14/13 0351  08/15/13 0215   08/23/13 0453   08/27/13 0335  08/28/13 0620  08/29/13 0625   NA  138  141  145  < >  145  < >  144  143  142   K  4.6  4.0  4.0  < >  3.4*  < >  4.0  3.9  4.3   CL  103  107  113*  < >  105  < >  96  94*  95*   CO2  24  20  21   < >  30  < >  40*  42*  39*   GLUCOSE  171*  118*  139*  < >  119*  < >  114*  93  181*   BUN  53*  43*  42*  < >  16  < >  13  16  18    CREATININE  1.57*  1.28*  1.30*  < >  0.94  < >  1.35*  1.57*  1.34*   CALCIUM  8.0*  8.4  8.6  < >  8.8  < >  9.2  9.2  9.4   MG  --  1.8  1.7  --  1.6  --  --  --  --   PHOS  --  3.4  3.1  --  --  --  --  --  --   < > = values in this interval not displayed.  Liver Function Tests:  Recent Labs   01/29/13 1411  08/26/13 0547   AST  49*  19   ALT  37  10   ALKPHOS  104  56   BILITOT  0.68  0.5   PROT  7.1  6.0   ALBUMIN  3.1*  2.1*   CBC:  Recent Labs   01/29/13 1411  08/12/13 1330   08/26/13 0547  08/27/13 0335  08/28/13 0620   WBC  6.9  17.9*  < >  10.3  10.1  10.5   NEUTROABS  4.5  15.1*  --  --  --  --   HGB  13.5  12.7  < >  9.6*  10.0*  10.1*   HCT  41.7  39.5  < >  31.6*  32.4*  33.1*   MCV  91.6  93.6  < >  95.5  96.1  97.6  PLT  165  187  < >  317  321  292   < > = values in this interval not displayed.  CBG:  Recent Labs   08/27/13 1211  08/27/13 1702  08/28/13 0756   GLUCAP  168*  120*  86   Imaging and Procedures:  Dg Chest Port 1 View  08/27/2013 CLINICAL DATA: Respiratory failure and dyspnea EXAM: PORTABLE CHEST - 1 VIEW COMPARISON: DG CHEST 1V PORT dated 08/26/2013 FINDINGS: The lungs are reasonably well inflated. The interstitial markings remain  increased but are slightly less conspicuous today. The hemidiaphragms remain obscured. The cardiopericardial silhouette remains enlarged and the pulmonary vascularity remains engorged. The pulmonary interstitial markings are perhaps slightly less conspicuous today. The left internal jugular venous catheter tip lies in the region of the proximal SVC. IMPRESSION: There has been slight interval improvement in the appearance of the pulmonary interstitium since yesterday's study suggesting interval decrease in interstitial edema. Considerable abnormality remains however. Electronically Signed By: David Martinique On: 08/27/2013 07:59  Dg Chest Port 1 View  08/26/2013 CLINICAL DATA: Respiratory failure. EXAM: PORTABLE CHEST - 1 VIEW COMPARISON: Chest x-ray 08/25/2013. FINDINGS: There is a left-sided internal jugular central venous catheter with tip terminating in the mid superior vena cava. Lung volumes are low. There is cephalization of the pulmonary vasculature and slight indistinctness of the interstitial markings suggestive of mild pulmonary edema. Small to moderate bilateral pleural effusions. Mild cardiomegaly. The patient is rotated to the left on today's exam, resulting in distortion of the mediastinal contours and reduced diagnostic sensitivity and specificity for mediastinal pathology. Atherosclerosis in the thoracic aorta. IMPRESSION: 1. Support apparatus, as above. 2. Low lung volumes with evidence of congestive heart failure, as above. Electronically Signed By: Vinnie Langton M.D. On: 08/26/2013 07:09  Dg Chest Port 1 View  08/25/2013 CLINICAL DATA: Respiratory failure EXAM: PORTABLE CHEST - 1 VIEW COMPARISON: 08/24/2013 FINDINGS: Left jugular central venous line is again seen and stable. Cardiac shadow is stable. Bilateral pleural effusions are noted as well as a degree of vascular congestion the overall appearance is stable from the prior study. IMPRESSION: No significant interval change from the prior exam.  Electronically Signed By: Inez Catalina M.D. On: 08/25/2013 07:32  Dg Chest Port 1 View  08/24/2013 CLINICAL DATA: Followup atelectasis EXAM: PORTABLE CHEST - 1 VIEW COMPARISON: 08/23/2013 FINDINGS: Progression of right lower lobe airspace consolidation. There is a moderate right effusion. Left lower lobe consolidation and small left effusion are similar. There is vascular congestion with probable mild edema Left jugular catheter tip in the SVC. IMPRESSION: Findings consistent with congestive heart failure with edema and bilateral effusions. Progression of right lower lobe consolidation which may be atelectasis or infiltrate. Electronically Signed By: Franchot Gallo M.D. On: 08/24/2013 06:43  Dg Chest Port 1 View  08/23/2013 CLINICAL DATA: Extubation. EXAM: PORTABLE CHEST - 1 VIEW COMPARISON: DG CHEST 1V PORT dated 08/22/2013; DG CHEST 1V PORT dated 08/21/2013 FINDINGS: Interim extubation. Left IJ line in stable position. Interim removal of NG tube. Progressive right lower lobe atelectasis is present. Cardiomegaly with pulmonary vascular prominence and interstitial prominence with bilateral pleural effusions. Findings consistent congestive heart failure. No pneumothorax. IMPRESSION: 1. Interim extubation and removal of NG tube. Left IJ line in stable position. 2. Severe right lower lobe atelectasis and consolidation. 3. Congestive heart failure with pulmonary interstitial edema and bilateral pleural effusions. Electronically Signed By: [place] On: 08/23/2013 08:00  Dg Chest Port 1 View  08/22/2013 CLINICAL DATA Evaluate infiltrates. EXAM PORTABLE  CHEST - 1 VIEW COMPARISON DG CHEST 1V PORT dated 08/21/2013 FINDINGS Endotracheal tube is roughly 2.7 cm above the carina. Nasogastric tube extends into the abdomen. Patchy interstitial densities bilaterally have not changed. Persistent densities at both lung bases are concerning for areas of consolidation and pleural fluid. Heart appears to be upper limits of normal  for size. Patient is rotated towards the right on the study. There is a left jugular central venous catheter near the upper SVC but poorly evaluated. Negative for a pneumothorax. IMPRESSION Diffuse interstitial densities are suggestive for pulmonary edema. Suspect bilateral pleural effusions with basilar consolidation or atelectasis. Support apparatuses as described. SIGNATURE Electronically Signed By: Markus Daft M.D. On: 08/22/2013 06:54  Dg Chest Port 1 View  08/21/2013 CLINICAL DATA Follow-up atelectasis. EXAM PORTABLE CHEST - 1 VIEW COMPARISON DG CHEST 1V PORT dated 08/20/2013 FINDINGS The cardiac silhouette appears mild to moderately enlarged, similar. Mediastinal silhouette is nonsuspicious, mildly calcified aortic knob. Similar mild interstitial prominence, with bibasilar airspace opacities and small pleural effusion, similar. Strandy midlung zone densities likely reflect atelectasis. No pneumothorax. Endotracheal tube tip projects 2.4 cm above the carina. Left internal jugular central venous catheter with distal tip approximating the proximal superior vena cava. Nasogastric tube past the GE junction the distal tip not imaged. Multiple EKG lines overlie the patient and may obscure subtle underlying pathology. IMPRESSION No apparent change in life-support lines. Stable cardiomegaly, interstitial prominence likely reflects pulmonary edema with bibasilar airspace opacities, midlung zone atelectasis and small pleural effusions, similar. SIGNATURE Electronically Signed By: Elon Alas On: 08/21/2013 06:55  Dg Chest Port 1 View  08/20/2013 CLINICAL DATA: Hypoxia EXAM: PORTABLE CHEST - 1 VIEW COMPARISON: Study obtained earlier in the day FINDINGS: Endotracheal tube tip is 2.4 cm above the carina. Nasogastric tube in side-port extend below the diaphragm. Central catheter tip is in the superior vena cava just beyond the junction with the left innominate vein. No pneumothorax. There is cardiomegaly with bilateral  effusions and patchy left and to a lesser extent right mid lung atelectatic change. There is no new opacity. IMPRESSION: Tube and catheter positions as described without pneumothorax. Evidence of a degree of underlying congestive heart failure. Patchy mid lung atelectatic change bilaterally, stable. Electronically Signed By: Lowella Grip M.D. On: 08/20/2013 07:58  Dg Chest Port 1 View  08/20/2013 CLINICAL DATA: Evaluate lung fields. EXAM: PORTABLE CHEST - 1 VIEW COMPARISON: DG CHEST 1V PORT dated 08/18/2013 FINDINGS: Endotracheal tube and enteric tube appear unchanged. The left IJ central line has been advanced and the redundant loop in the neck is no longer visualized. The tip of the left IJ central line is at the confluence of the brachiocephalic veins. Bilateral pleural effusions, left-greater-than-right basilar predominant airspace disease and basilar predominant atelectasis is unchanged compared to prior. Cardiopericardial silhouette unchanged. Patient is rotated to the right. IMPRESSION: 1. Adjustment of support apparatus detailed above. 2. Unchanged appearance of the heart and lungs. Electronically Signed By: Dereck Ligas M.D. On: 08/20/2013 07:48  Dg Chest Port 1 View  08/18/2013 CLINICAL DATA: Endotracheal tube placement EXAM: PORTABLE CHEST - 1 VIEW COMPARISON: Prior radiograph from 08/17/2013 FINDINGS: Tip of the endotracheal tube is positioned approximately 1.7 cm above the carina. Enteric tube courses into the abdomen. Left IJ approach central venous catheter is stable in position. The catheter is coiled near its insertion site in the lower left neck. Cardiomegaly is stable. There has been slight interval worsening in diffuse pulmonary vascular congestion with indistinctness of the interstitial markings, suggestive of progressive edema.  Small moderate bilateral pleural effusions with associated bibasilar opacities are similar. No pneumothorax. No definite new focal infiltrate. Osseous structures  are unchanged. IMPRESSION: 1. Tip of the endotracheal tube 1.7 cm above the carina. 2. Slight interval worsening in diffuse pulmonary vascular congestion with indistinctness of the interstitial markings, suggesting worsened edema. 3. Similar bilateral pleural effusions with associated bibasilar opacities, likely atelectasis. Electronically Signed By: Jeannine Boga M.D. On: 08/18/2013 06:18  Dg Chest Port 1 View  08/17/2013 CLINICAL DATA: Intubation EXAM: PORTABLE CHEST - 1 VIEW COMPARISON: Prior radiograph from 08/15/2013 FINDINGS: Tip of the endotracheal tube is positioned approximately 2 cm above the carina. Left IJ approach system venous catheter is gross stable. Enteric tube courses into the abdomen. Cardiomegaly is unchanged. Bilateral pleural effusions with associated bibasilar opacities are not significantly changed. Diffuse pulmonary vascular congestion without frank pulmonary edema is similar. No pneumothorax. Osseous structures are unchanged. IMPRESSION: 1. Tip of the endotracheal tube 2 cm above the carina. Otherwise stable support apparatus. 2. Small bilateral pleural effusions with associated bibasilar opacity, which may reflect atelectasis or infiltrate. 3. Stable cardiomegaly with diffuse pulmonary vascular congestion without frank pulmonary edema, similar to prior. Electronically Signed By: Jeannine Boga M.D. On: 08/17/2013 06:36  Dg Chest Port 1 View  08/15/2013 CLINICAL DATA: Respiratory distress EXAM: PORTABLE CHEST - 1 VIEW COMPARISON: 08/14/2013 FINDINGS: Endotracheal tube is 1 cm above the carina. NG tube enters the stomach. Central venous catheter tip in the SVC is unchanged. Bibasilar consolidation unchanged. Small bilateral effusions unchanged. Pulmonary vascular congestion unchanged IMPRESSION: Endotracheal tube is just above the carina. Bibasilar consolidation and small effusions unchanged. Pulmonary vascular congestion unchanged. Electronically Signed By: Franchot Gallo M.D.  On: 08/15/2013 08:38  Dg Chest Port 1 View  08/14/2013 CLINICAL DATA: Post intubation. EXAM: PORTABLE CHEST - 1 VIEW COMPARISON: 08/14/2013 FINDINGS: Endotracheal tube is roughly 2.1 cm above the carina. Again noted is enlargement of the cardiac silhouette. Enlarged interstitial lung markings are suggestive for edema. There are bibasilar densities which could represent a combination atelectasis and pleural fluid. Left jugular central venous catheter tip is near the central left innominate vein. IMPRESSION: Endotracheal tube is appropriately positioned above the carina. Cardiomegaly with evidence for interstitial edema. Bibasilar densities as described. Electronically Signed By: Markus Daft M.D. On: 08/14/2013 09:46  Dg Chest Port 1 View  08/14/2013 CLINICAL DATA: Increasing shortness of breath. EXAM: PORTABLE CHEST - 1 VIEW COMPARISON: Chest x-ray from yesterday FINDINGS: Left IJ catheter in similar position when accounting for distortion by rightward rotation. Persistent cardiopericardial enlargement. Increased hazy density of the chest, likely bilateral layering effusions. There is pulmonary venous congestion and probable edema. No new asymmetric opacity. No pneumothorax. IMPRESSION: 1. Pulmonary venous hypertension and probable layering pleural effusions. 2. Bibasilar atelectasis. Electronically Signed By: Jorje Guild M.D. On: 08/14/2013 04:09  Dg Chest Port 1 View  08/13/2013 CLINICAL DATA: Central line placement EXAM: PORTABLE CHEST - 1 VIEW COMPARISON: 08/12/2013 FINDINGS: Left jugular central venous catheter placement. Catheter tip in the SVC. No pneumothorax. Increase in bibasilar atelectasis. IMPRESSION: Central line placement in the SVC without pneumothorax Increase in bibasilar atelectasis. Electronically Signed By: Franchot Gallo M.D. On: 08/13/2013 10:24  Dg Chest Port 1 View  08/12/2013 CLINICAL DATA: Pre-op for buttock abscess surgery. EXAM: PORTABLE CHEST - 1 VIEW COMPARISON: DG CHEST 2 VIEW dated  06/07/2013; DG CHEST 2 VIEW dated 06/11/2012; DG CHEST 2 VIEW dated 03/17/2011 FINDINGS: 1618 hr. There are lower lung volumes with increased patchy perihilar opacities in the right lung. The left  lung is clear. The heart size and mediastinal contours are stable. There is no pneumothorax or significant pleural effusion. The osseous structures appear unchanged. IMPRESSION: New patchy perihilar opacities in the right lung, likely atelectasis. No consolidation or significant changes demonstrated. Electronically Signed By: Camie Patience M.D. On: 08/12/2013 16:28  Dg Abd Portable 1v  08/14/2013 CLINICAL DATA: NG placement EXAM: PORTABLE ABDOMEN - 1 VIEW COMPARISON: None. FINDINGS: The patient is rotated on this study. With technique taking into consideration an NG tube is appreciated coursing in the expected region of the stomach. The tip projects in the expected region of the gastric antrum/duodenal bulb. Air is seen within nondilated loops of small bowel. Degenerative changes are appreciated throughout the thoracolumbar spine. IMPRESSION: NG tube which appears coarse in expected region of the stomach. Electronically Signed By: Margaree Mackintosh M.D. On: 08/14/2013 15:59  Assessment/Plan  #1-rectal bleeding-this appears to be a one-time episode most likely due to hemorrhoids her hemoglobin has been stable Will recheck CBC this before discharge    2. Necrotizing fasciitis of perineum  -continue current wound care by home healt and keep f/u with surgery  -completed course of abx  t  3. Acute on chronic kidney disease, stage 3  -improved --we'll update BMP before discharge -, avoid ace as this was on her allergies and she had a rash and acute renal failure with this--seems she may just do better without it at this point per Dr. Cyndi Lennert assessment--  4. DIABETES MELLITUS, TYPE II  -on diet alone at this time --CBGs run low 100s in the morning-more higher 100s at noon-generally in the 200s later day parts--will defer  aggressive workup of this to primary care provider would be hesitant to change medications before discharge--nursing will provide diabetic teaching before discharge Lab Results   Component  Value  Date    HGBA1C  7.7*  08/12/2013   5. Acute on chronic diastolic CHF (congestive heart failure)  -says she has previously been on lasix--currently on 40 mg a day Will update metabolic panel before discharge --clinically this appears stable  #6-rash-this is significantly improved just has asmall amount left on her lower leg-she has been on prednisone Will reduce this before discharge with expedient followed by primary care provider for further titration down I suspect  Also will write order to contact Dr. Molinda Bailiff care-about need to continue Foley  Of note she still has significant weakness she has a rolling walker it appears will need a bedside commode as well to minimize fall risk.  Also will need home health nursing support for her medical issues most prominently her wound care.  Also will need PT and OT for continued strengthening  t [id number]-of note greater than 30 minutes spent on this discharge summary-of note greater than 50% of time spent coordinating plan of care

## 2013-10-03 ENCOUNTER — Encounter: Payer: Self-pay | Admitting: Internal Medicine

## 2013-10-03 ENCOUNTER — Ambulatory Visit (INDEPENDENT_AMBULATORY_CARE_PROVIDER_SITE_OTHER): Payer: Medicare Other | Admitting: Internal Medicine

## 2013-10-03 VITALS — BP 150/70 | HR 117 | Temp 98.1°F | Wt 229.5 lb

## 2013-10-03 DIAGNOSIS — I10 Essential (primary) hypertension: Secondary | ICD-10-CM

## 2013-10-03 DIAGNOSIS — R21 Rash and other nonspecific skin eruption: Secondary | ICD-10-CM

## 2013-10-03 DIAGNOSIS — E119 Type 2 diabetes mellitus without complications: Secondary | ICD-10-CM

## 2013-10-03 NOTE — Progress Notes (Signed)
Subjective:    Patient ID: [name], female    DOB: Jul 03, 1929, 80 y.o.   MRN: [id number]  HPI  Here for f/u , recent hospn and rehab, now home for 1 wk, on prednisone for rash to LE's much improved, asks to be off prednisone b/c seems to cause blurry vision and anxiety;  Pt denies chest pain, increased sob or doe, wheezing, orthopnea, PND, increased LE swelling, palpitations, dizziness or syncope.  Pt denies new neurological symptoms such as new headache, or facial or extremity weakness or numbness   Pt denies polydipsia, polyuria,  Has leg bag/foley in place, plans to f/u with surgury at 6 wks Past Medical History  Diagnosis Date  . Hypertension   . Hyperlipidemia   . Morbid obesity   . Osteoarthritis   . Gout   . CVA (cerebral infarction)   . IBS (irritable bowel syndrome)   . Diverticulosis   . Esophageal stricture   . Colon polyps     hyperplastic  . Anxiety   . Hiatal hernia     4 cm  . Shingles 02/21/2011  . Skin cancer   . Breast cancer     right breast  . Dysrhythmia     dr bensimhon   . Blood transfusion   . Chronic kidney disease     occ uti's  . GERD (gastroesophageal reflux disease)   . PONV (postoperative nausea and vomiting)   . DVT of leg (deep venous thrombosis)     left; S/P OR  . Atrial flutter     "sometimes"  . Bronchitis   . Pneumonia     "walking"  . Shortness of breath on exertion   . Anemia   . Stroke 2002    residual:  "little tingling in palm of left hand"  . Kidney stones 1990's  . UTI (lower urinary tract infection)     "I've had a few"  . History of radiation therapy 02/15/12-04/02/12    right breast 4680 cGy/26 sessions, right boost=1400cGy /7 sessions   Past Surgical History  Procedure Laterality Date  . Abdominal hysterectomy    . Bile duct stent placement  ~ 01/2011  . Pelvic bone tumor removal      twice in 2000's  . Colonic perforation repair  ~ 2000  . Total hip arthroplasty  1980's    right  . Joint replacement    .  Colon surgery      hole in intestine ,tumors?  . Tonsillectomy  ~ 1946  . Cholecystectomy  03/23/11    lap chole   . Replacement total knee bilateral  ~ 2008; ~ 2010    right; left  . Cataract extraction w/ intraocular lens  implant, bilateral  1990's  . Hemorrhoid surgery  2012  . Breast lumpectomy  06/29/11    right  . Breast lumpectomy  06/29/2011    Procedure: BREAST LUMPECTOMY WITH EXCISION OF SENTINEL NODE;  Surgeon: Pedro Earls, MD;  Location: [facility];  Service: General;  Laterality: Right;  right sentinel node mapping,right sentinel node biopsy, needle localization right breast lumpectomy  . Incision and drainage breast abscess      right breast seroma  . Incision and drainage abscess N/A 08/12/2013    Procedure: INCISION AND DRAINAGE ABSCESS;  Surgeon: Earnstine Regal, MD;  Location: [facility];  Service: General;  Laterality: N/A;  . Irrigation and debridement abscess N/A 08/14/2013    Procedure: Debridment of perineal tissue and debridement of  perineal wound;  Surgeon: Earnstine Regal, MD;  Location: [facility];  Service: General;  Laterality: N/A;  . Irrigation and debridement abscess N/A 08/16/2013    Procedure: DRESSING CHANGE AND DEBRIDEMENT OF PERINEUM WOUND;  Surgeon: Earnstine Regal, MD;  Location: [facility];  Service: General;  Laterality: N/A;  . Pilonidal cyst drainage N/A 08/19/2013    Procedure: Dressing change perineum;  Surgeon: Ralene Ok, MD;  Location: [facility];  Service: General;  Laterality: N/A;  . Minor application of wound vac N/A 08/20/2013    Procedure: MINOR APPLICATION OF WOUND VAC;  Surgeon: Ralene Ok, MD;  Location: [facility];  Service: General;  Laterality: N/A;  . Incision and drainage abscess N/A 08/20/2013    Procedure: INCISION AND DRAINAGE ABSCESS;  Surgeon: Ralene Ok, MD;  Location: [facility];  Service: General;  Laterality: N/A;    reports that she has never smoked. She has never used smokeless tobacco. She reports that she does not drink alcohol or use  illicit drugs. family history includes Breast cancer in her sister; Diabetes in her sister; Heart disease in her maternal uncle and paternal uncle; Kidney failure in her sister; Prostate cancer in her brother; Stroke in her mother. There is no history of Colon cancer. Allergies  Allergen Reactions  . Ace Inhibitors Other (See Comments)    Tolerates lisinopril at home. Pt does not recall allergy.  . Atorvastatin     REACTION: myalgias  . Oxycodone-Acetaminophen     REACTION: confusion, fatigue  . Rosuvastatin     REACTION: leg weakness  . Simvastatin     REACTION: leg weakness  . Codeine Nausea And Vomiting and Rash  . Sulfonamide Derivatives Hives and Rash   Current Outpatient Prescriptions on File Prior to Visit  Medication Sig Dispense Refill  . albuterol (PROVENTIL) (2.5 MG/3ML) 0.083% nebulizer solution Take 3 mLs (2.5 mg total) by nebulization every 6 (six) hours.  75 mL  12  . allopurinol (ZYLOPRIM) 100 MG tablet Take 100 mg by mouth every other day.      . anastrozole (ARIMIDEX) 1 MG tablet Take 1 mg by mouth every morning.      Marland Kitchen aspirin 325 MG EC tablet Take 325 mg by mouth every other day.      Marland Kitchen aspirin EC 81 MG tablet Take 243 mg by mouth every other day.       . cholecalciferol (VITAMIN D) 1000 UNITS tablet Take 1,000 Units by mouth every morning.       . furosemide (LASIX) 40 MG tablet Take 40 mg by mouth every morning.      . hydrocortisone cream 1 % Apply topically 2 (two) times daily.  30 g  0  . polyvinyl alcohol (LIQUIFILM TEARS) 1.4 % ophthalmic solution Place 2 drops into both eyes daily as needed for dry eyes.      . predniSONE (DELTASONE) 20 MG tablet Take 20 mg by mouth daily with breakfast.      . saccharomyces boulardii (FLORASTOR) 250 MG capsule Take 1 capsule (250 mg total) by mouth 2 (two) times daily.      . traMADol-acetaminophen (ULTRACET) 37.5-325 MG per tablet Take one tablet by mouth every 6 hours as needed for pain  120 tablet  5  . famotidine  (PEPCID) 20 MG tablet Take 1 tablet (20 mg total) by mouth daily.       No current facility-administered medications on file prior to visit.   Review of Systems  Constitutional: Negative for  unusual diaphoresis or other sweats  HENT: Negative for ringing in ear Eyes: Negative for double vision or worsening visual disturbance.  Respiratory: Negative for choking and stridor.   Gastrointestinal: Negative for vomiting or other signifcant bowel change Genitourinary: Negative for hematuria or decreased urine volume.  Musculoskeletal: Negative for other MSK pain or swelling Skin: Negative for color change and worsening wound.  Neurological: Negative for tremors and numbness other than noted  Psychiatric/Behavioral: Negative for decreased concentration or agitation other than above       Objective:   Physical Exam BP 150/70  Pulse 117  Temp(Src) 98.1 F (36.7 C) (Oral)  Wt 229 lb 8 oz (104.101 kg)  SpO2 96% VS noted,  Constitutional: Pt appears well-developed, well-nourished. Annabell Sabal but less after illness HENT: Head: NCAT.  Right Ear: External ear normal.  Left Ear: External ear normal.  Eyes: . Pupils are equal, round, and reactive to light. Conjunctivae and EOM are normal Neck: Normal range of motion. Neck supple.  Cardiovascular: Normal rate and regular rhythm.   Pulmonary/Chest: Effort normal and breath sounds normal.  Abd:  Soft, NT, ND, + BS Neurological: Pt is alert. Not confused , motor grossly intact Skin: Skin is warm. Minimal erythema right pretibial only Psychiatric: Pt behavior is normal. No agitation.     Assessment & Plan:

## 2013-10-03 NOTE — Progress Notes (Signed)
Pre visit review using our clinic review tool, if applicable. No additional management support is needed unless otherwise documented below in the visit note. 

## 2013-10-03 NOTE — Assessment & Plan Note (Signed)
Essentially resolved, to d/c prednisone, do not feel pt needs wean,  to f/u any worsening symptoms or concerns

## 2013-10-03 NOTE — Assessment & Plan Note (Signed)
stable overall by history and exam, recent data reviewed with pt, and pt to continue medical treatment as before,  to f/u any worsening symptoms or concerns Lab Results  Component Value Date   HGBA1C 7.7* 08/12/2013

## 2013-10-03 NOTE — Assessment & Plan Note (Signed)
stable overall by history and exam, recent data reviewed with pt, and pt to continue medical treatment as before,  to f/u any worsening symptoms or concerns le BP Readings from Last 3 Encounters:  10/03/13 150/70  09/25/13 110/47  09/12/13 150/80

## 2013-10-03 NOTE — Patient Instructions (Signed)
Ok to stop the prednisone  Please continue all other medications as before, and refills have been done if requested. Please have the pharmacy call with any other refills you may need.  Please keep your appointments with your specialists as you have planned  We can hold on further lab work today  Please return in 3 months, or sooner if needed

## 2013-10-04 ENCOUNTER — Telehealth: Payer: Self-pay | Admitting: Internal Medicine

## 2013-10-04 NOTE — Telephone Encounter (Signed)
Relevant patient education mailed to patient.  

## 2013-10-08 ENCOUNTER — Telehealth (INDEPENDENT_AMBULATORY_CARE_PROVIDER_SITE_OTHER): Payer: Self-pay

## 2013-10-08 ENCOUNTER — Telehealth: Payer: Self-pay

## 2013-10-08 NOTE — Telephone Encounter (Signed)
Dr. Harlow Asa advised pt be seen by her PCP, Dr. [name], to evaluate bleeding.  Dr. Jenny Reichmann out of the office.  Appt scheduled with Dr. Linna Darner on 10/10/13 at 1:00 p.m.  Pt made aware.

## 2013-10-08 NOTE — Telephone Encounter (Signed)
Pt is s/p debridement of a perineal wound on 08/16/13 by Dr. Harlow Asa.  She has home health come in 1 x weekly to check on the wound.  Her husband changes the dressings.  She has had chronic diarrhea since the surgery.  The wound does drain some, but she has recently noticed an increase of bloody d/c.  Yesterday after a BM there was "a lot of dark blood" in the commode.  Pt complains of weakness and fatigue along with no appetite.  Dr. Harlow Asa to be made aware and we will call the patient back.

## 2013-10-10 ENCOUNTER — Emergency Department (HOSPITAL_COMMUNITY): Payer: Medicare Other

## 2013-10-10 ENCOUNTER — Encounter: Payer: Self-pay | Admitting: Internal Medicine

## 2013-10-10 ENCOUNTER — Encounter (HOSPITAL_COMMUNITY): Payer: Self-pay | Admitting: Emergency Medicine

## 2013-10-10 ENCOUNTER — Observation Stay (HOSPITAL_COMMUNITY)
Admission: EM | Admit: 2013-10-10 | Discharge: 2013-10-14 | Disposition: A | Discharge status: Home / Self Care | Payer: Medicare Other | Attending: Internal Medicine | Admitting: Internal Medicine

## 2013-10-10 ENCOUNTER — Ambulatory Visit (INDEPENDENT_AMBULATORY_CARE_PROVIDER_SITE_OTHER): Payer: Medicare Other | Admitting: Internal Medicine

## 2013-10-10 VITALS — BP 150/68 | HR 106 | Temp 98.2°F | Resp 14 | Wt 229.6 lb

## 2013-10-10 DIAGNOSIS — E785 Hyperlipidemia, unspecified: Secondary | ICD-10-CM | POA: Insufficient documentation

## 2013-10-10 DIAGNOSIS — N179 Acute kidney failure, unspecified: Secondary | ICD-10-CM | POA: Insufficient documentation

## 2013-10-10 DIAGNOSIS — K625 Hemorrhage of anus and rectum: Secondary | ICD-10-CM

## 2013-10-10 DIAGNOSIS — I5033 Acute on chronic diastolic (congestive) heart failure: Secondary | ICD-10-CM

## 2013-10-10 DIAGNOSIS — R21 Rash and other nonspecific skin eruption: Secondary | ICD-10-CM

## 2013-10-10 DIAGNOSIS — K219 Gastro-esophageal reflux disease without esophagitis: Secondary | ICD-10-CM | POA: Insufficient documentation

## 2013-10-10 DIAGNOSIS — Z8739 Personal history of other diseases of the musculoskeletal system and connective tissue: Secondary | ICD-10-CM | POA: Insufficient documentation

## 2013-10-10 DIAGNOSIS — K922 Gastrointestinal hemorrhage, unspecified: Secondary | ICD-10-CM | POA: Diagnosis present

## 2013-10-10 DIAGNOSIS — E43 Unspecified severe protein-calorie malnutrition: Secondary | ICD-10-CM

## 2013-10-10 DIAGNOSIS — K648 Other hemorrhoids: Secondary | ICD-10-CM | POA: Insufficient documentation

## 2013-10-10 DIAGNOSIS — B029 Zoster without complications: Secondary | ICD-10-CM

## 2013-10-10 DIAGNOSIS — I509 Heart failure, unspecified: Secondary | ICD-10-CM

## 2013-10-10 DIAGNOSIS — A419 Sepsis, unspecified organism: Secondary | ICD-10-CM

## 2013-10-10 DIAGNOSIS — K5731 Diverticulosis of large intestine without perforation or abscess with bleeding: Secondary | ICD-10-CM

## 2013-10-10 DIAGNOSIS — Z79899 Other long term (current) drug therapy: Secondary | ICD-10-CM | POA: Insufficient documentation

## 2013-10-10 DIAGNOSIS — I10 Essential (primary) hypertension: Secondary | ICD-10-CM

## 2013-10-10 DIAGNOSIS — K589 Irritable bowel syndrome without diarrhea: Secondary | ICD-10-CM

## 2013-10-10 DIAGNOSIS — C50419 Malignant neoplasm of upper-outer quadrant of unspecified female breast: Secondary | ICD-10-CM

## 2013-10-10 DIAGNOSIS — I129 Hypertensive chronic kidney disease with stage 1 through stage 4 chronic kidney disease, or unspecified chronic kidney disease: Secondary | ICD-10-CM | POA: Insufficient documentation

## 2013-10-10 DIAGNOSIS — M726 Necrotizing fasciitis: Secondary | ICD-10-CM

## 2013-10-10 DIAGNOSIS — D62 Acute posthemorrhagic anemia: Secondary | ICD-10-CM

## 2013-10-10 DIAGNOSIS — Z860100 Personal history of colon polyps, unspecified: Secondary | ICD-10-CM

## 2013-10-10 DIAGNOSIS — C50911 Malignant neoplasm of unspecified site of right female breast: Secondary | ICD-10-CM | POA: Diagnosis present

## 2013-10-10 DIAGNOSIS — C50919 Malignant neoplasm of unspecified site of unspecified female breast: Secondary | ICD-10-CM | POA: Insufficient documentation

## 2013-10-10 DIAGNOSIS — E119 Type 2 diabetes mellitus without complications: Secondary | ICD-10-CM | POA: Insufficient documentation

## 2013-10-10 DIAGNOSIS — I69998 Other sequelae following unspecified cerebrovascular disease: Secondary | ICD-10-CM | POA: Insufficient documentation

## 2013-10-10 DIAGNOSIS — R6521 Severe sepsis with septic shock: Secondary | ICD-10-CM

## 2013-10-10 DIAGNOSIS — G609 Hereditary and idiopathic neuropathy, unspecified: Secondary | ICD-10-CM

## 2013-10-10 DIAGNOSIS — I6350 Cerebral infarction due to unspecified occlusion or stenosis of unspecified cerebral artery: Secondary | ICD-10-CM

## 2013-10-10 DIAGNOSIS — Z90710 Acquired absence of both cervix and uterus: Secondary | ICD-10-CM | POA: Insufficient documentation

## 2013-10-10 DIAGNOSIS — J811 Chronic pulmonary edema: Secondary | ICD-10-CM

## 2013-10-10 DIAGNOSIS — K8040 Calculus of bile duct with cholecystitis, unspecified, without obstruction: Secondary | ICD-10-CM

## 2013-10-10 DIAGNOSIS — I5032 Chronic diastolic (congestive) heart failure: Secondary | ICD-10-CM | POA: Insufficient documentation

## 2013-10-10 DIAGNOSIS — Z86718 Personal history of other venous thrombosis and embolism: Secondary | ICD-10-CM | POA: Insufficient documentation

## 2013-10-10 DIAGNOSIS — N183 Chronic kidney disease, stage 3 unspecified: Secondary | ICD-10-CM | POA: Insufficient documentation

## 2013-10-10 DIAGNOSIS — R5383 Other fatigue: Secondary | ICD-10-CM

## 2013-10-10 DIAGNOSIS — M109 Gout, unspecified: Secondary | ICD-10-CM | POA: Insufficient documentation

## 2013-10-10 DIAGNOSIS — Z96649 Presence of unspecified artificial hip joint: Secondary | ICD-10-CM | POA: Insufficient documentation

## 2013-10-10 DIAGNOSIS — Z872 Personal history of diseases of the skin and subcutaneous tissue: Secondary | ICD-10-CM

## 2013-10-10 DIAGNOSIS — F411 Generalized anxiety disorder: Secondary | ICD-10-CM

## 2013-10-10 DIAGNOSIS — Z85828 Personal history of other malignant neoplasm of skin: Secondary | ICD-10-CM | POA: Insufficient documentation

## 2013-10-10 DIAGNOSIS — R259 Unspecified abnormal involuntary movements: Secondary | ICD-10-CM

## 2013-10-10 DIAGNOSIS — Z7982 Long term (current) use of aspirin: Secondary | ICD-10-CM | POA: Insufficient documentation

## 2013-10-10 DIAGNOSIS — E46 Unspecified protein-calorie malnutrition: Secondary | ICD-10-CM | POA: Insufficient documentation

## 2013-10-10 DIAGNOSIS — A0472 Enterocolitis due to Clostridium difficile, not specified as recurrent: Secondary | ICD-10-CM | POA: Insufficient documentation

## 2013-10-10 DIAGNOSIS — T148XXD Other injury of unspecified body region, subsequent encounter: Secondary | ICD-10-CM

## 2013-10-10 DIAGNOSIS — R5381 Other malaise: Secondary | ICD-10-CM

## 2013-10-10 DIAGNOSIS — Z923 Personal history of irradiation: Secondary | ICD-10-CM | POA: Insufficient documentation

## 2013-10-10 DIAGNOSIS — Z96659 Presence of unspecified artificial knee joint: Secondary | ICD-10-CM | POA: Insufficient documentation

## 2013-10-10 DIAGNOSIS — N289 Disorder of kidney and ureter, unspecified: Secondary | ICD-10-CM

## 2013-10-10 DIAGNOSIS — R197 Diarrhea, unspecified: Secondary | ICD-10-CM | POA: Insufficient documentation

## 2013-10-10 DIAGNOSIS — K5730 Diverticulosis of large intestine without perforation or abscess without bleeding: Secondary | ICD-10-CM | POA: Insufficient documentation

## 2013-10-10 DIAGNOSIS — Z9089 Acquired absence of other organs: Secondary | ICD-10-CM | POA: Insufficient documentation

## 2013-10-10 LAB — COMPREHENSIVE METABOLIC PANEL
ALT: 14 U/L (ref 0–35)
AST: 20 U/L (ref 0–37)
Albumin: 2.8 g/dL — ABNORMAL LOW (ref 3.5–5.2)
Alkaline Phosphatase: 83 U/L (ref 39–117)
BUN: 33 mg/dL — ABNORMAL HIGH (ref 6–23)
CO2: 26 mEq/L (ref 19–32)
Calcium: 9.8 mg/dL (ref 8.4–10.5)
Chloride: 101 mEq/L (ref 96–112)
Creatinine, Ser: 1.31 mg/dL — ABNORMAL HIGH (ref 0.50–1.10)
GFR calc Af Amer: 43 mL/min — ABNORMAL LOW (ref >90)
GFR calc non Af Amer: 37 mL/min — ABNORMAL LOW (ref >90)
Glucose, Bld: 146 mg/dL — ABNORMAL HIGH (ref 70–99)
Potassium: 4.3 mEq/L (ref 3.7–5.3)
Sodium: 142 mEq/L (ref 137–147)
Total Bilirubin: 0.6 mg/dL (ref 0.3–1.2)
Total Protein: 6.7 g/dL (ref 6.0–8.3)

## 2013-10-10 LAB — CBC
HCT: 42.1 % (ref 36.0–46.0)
Hemoglobin: 12.9 g/dL (ref 12.0–15.0)
MCH: 27.6 pg (ref 26.0–34.0)
MCHC: 30.6 g/dL (ref 30.0–36.0)
MCV: 90.1 fL (ref 78.0–100.0)
Platelets: 242 10*3/uL (ref 150–400)
RBC: 4.67 MIL/uL (ref 3.87–5.11)
RDW: 15.6 % — ABNORMAL HIGH (ref 11.5–15.5)
WBC: 10.5 10*3/uL (ref 4.0–10.5)

## 2013-10-10 LAB — TYPE AND SCREEN
ABO/RH(D): O NEG
Antibody Screen: NEGATIVE

## 2013-10-10 LAB — HEMOGLOBIN AND HEMATOCRIT, BLOOD
HCT: 36.5 % (ref 36.0–46.0)
Hemoglobin: 11 g/dL — ABNORMAL LOW (ref 12.0–15.0)

## 2013-10-10 LAB — PROTIME-INR
INR: 1.03 (ref 0.00–1.49)
Prothrombin Time: 13.3 seconds (ref 11.6–15.2)

## 2013-10-10 LAB — APTT: aPTT: 31 seconds (ref 24–37)

## 2013-10-10 LAB — GLUCOSE, CAPILLARY: Glucose-Capillary: 109 mg/dL — ABNORMAL HIGH (ref 70–99)

## 2013-10-10 MED ORDER — ONDANSETRON HCL 4 MG PO TABS: 4.0000 mg | ORAL_TABLET | Freq: Four times a day (QID) | ORAL | Status: DC | PRN

## 2013-10-10 MED ORDER — ALLOPURINOL 100 MG PO TABS: 100.0000 mg | ORAL_TABLET | ORAL | Status: DC

## 2013-10-10 MED ORDER — ALLOPURINOL 100 MG PO TABS
200.0000 mg | ORAL_TABLET | ORAL | Status: DC
  Administered 2013-10-11 – 2013-10-13 (×2): 200 mg via ORAL
  Filled 2013-10-10 (×2): qty 2

## 2013-10-10 MED ORDER — ONDANSETRON HCL 4 MG/2ML IJ SOLN: 4.0000 mg | Freq: Four times a day (QID) | INTRAMUSCULAR | Status: DC | PRN

## 2013-10-10 MED ORDER — LISINOPRIL 40 MG PO TABS
40.0000 mg | ORAL_TABLET | Freq: Every day | ORAL | Status: DC
  Filled 2013-10-10: qty 1

## 2013-10-10 MED ORDER — POLYVINYL ALCOHOL 1.4 % OP SOLN: 2.0000 [drp] | Freq: Every day | OPHTHALMIC | Status: DC | PRN

## 2013-10-10 MED ORDER — IOHEXOL 300 MG/ML  SOLN
50.0000 mL | Freq: Once | INTRAMUSCULAR | Status: AC | PRN
  Administered 2013-10-10: 50 mL via ORAL

## 2013-10-10 MED ORDER — ALUM & MAG HYDROXIDE-SIMETH 200-200-20 MG/5ML PO SUSP: 30.0000 mL | Freq: Four times a day (QID) | ORAL | Status: DC | PRN

## 2013-10-10 MED ORDER — ANASTROZOLE 1 MG PO TABS
1.0000 mg | ORAL_TABLET | Freq: Every morning | ORAL | Status: DC
  Administered 2013-10-11 – 2013-10-14 (×4): 1 mg via ORAL
  Filled 2013-10-10 (×6): qty 1

## 2013-10-10 MED ORDER — ALLOPURINOL 100 MG PO TABS
100.0000 mg | ORAL_TABLET | ORAL | Status: DC
  Administered 2013-10-12 – 2013-10-14 (×2): 100 mg via ORAL
  Filled 2013-10-10 (×2): qty 1

## 2013-10-10 MED ORDER — INSULIN ASPART 100 UNIT/ML ~~LOC~~ SOLN
0.0000 [IU] | Freq: Three times a day (TID) | SUBCUTANEOUS | Status: DC
  Administered 2013-10-11: 1 [IU] via SUBCUTANEOUS
  Administered 2013-10-11 – 2013-10-12 (×2): 2 [IU] via SUBCUTANEOUS
  Administered 2013-10-12 (×2): 1 [IU] via SUBCUTANEOUS
  Administered 2013-10-13: 3 [IU] via SUBCUTANEOUS
  Administered 2013-10-13: 1 [IU] via SUBCUTANEOUS

## 2013-10-10 MED ORDER — ACETAMINOPHEN 325 MG PO TABS
650.0000 mg | ORAL_TABLET | Freq: Four times a day (QID) | ORAL | Status: DC | PRN
  Filled 2013-10-10: qty 2

## 2013-10-10 MED ORDER — FUROSEMIDE 40 MG PO TABS
40.0000 mg | ORAL_TABLET | Freq: Every morning | ORAL | Status: DC
  Administered 2013-10-11 – 2013-10-12 (×2): 40 mg via ORAL
  Filled 2013-10-10 (×2): qty 1

## 2013-10-10 MED ORDER — SODIUM CHLORIDE 0.9 % IV SOLN
INTRAVENOUS | Status: DC
  Administered 2013-10-10: 21:00:00 via INTRAVENOUS

## 2013-10-10 MED ORDER — PANTOPRAZOLE SODIUM 40 MG IV SOLR
40.0000 mg | Freq: Two times a day (BID) | INTRAVENOUS | Status: DC
  Administered 2013-10-10 – 2013-10-12 (×4): 40 mg via INTRAVENOUS
  Filled 2013-10-10 (×5): qty 40

## 2013-10-10 MED ORDER — IOHEXOL 300 MG/ML  SOLN
100.0000 mL | Freq: Once | INTRAMUSCULAR | Status: AC | PRN
  Administered 2013-10-10: 100 mL via INTRAVENOUS

## 2013-10-10 MED ORDER — HYDROMORPHONE HCL PF 1 MG/ML IJ SOLN
0.5000 mg | INTRAMUSCULAR | Status: DC | PRN
  Filled 2013-10-10: qty 1

## 2013-10-10 MED ORDER — INSULIN ASPART 100 UNIT/ML ~~LOC~~ SOLN: 0.0000 [IU] | Freq: Every day | SUBCUTANEOUS | Status: DC

## 2013-10-10 MED ORDER — ACETAMINOPHEN 650 MG RE SUPP: 650.0000 mg | Freq: Four times a day (QID) | RECTAL | Status: DC | PRN

## 2013-10-10 NOTE — Patient Instructions (Signed)
Go to [facility] for possible Observation Admission

## 2013-10-10 NOTE — Progress Notes (Signed)
Pre visit review using our clinic review tool, if applicable. No additional management support is needed unless otherwise documented below in the visit note. 

## 2013-10-10 NOTE — ED Notes (Signed)
Per pt, states she saw Dr Linna Darner today for rectal bleeding that's been going on for a week-was here in March for a perirectal abscess

## 2013-10-10 NOTE — H&P (Addendum)
[facility] History and Physical  [name] [id number] DOB: 1930/01/06 DOA: 10/10/2013  Referring physician: EDP PCP: Marie Cower, MD  Specialists:   Chief Complaint: Rectal Bleeding  HPI: [name] is a 80 y.o. female with Multiple Medical problems and Recent Necrotizing Fasciitis of the perineum in 08/2013 with I+D x 3 on continued wound care who present ed to her PCP today with complaints of rectal bleeding x 1 week.  She reports passing BRBPR, and passing large clots at times. She denies having associated ABD or pelvic pain and denies having any Nausea or Vomiting.   She has had progressive weakness over the past week.   She denies having any similar symptoms in the past.      Review of Systems:  Constitutional: No Weight Loss, No Weight Gain, Night Sweats, Fevers, Chills, Fatigue, +Generalized Weakness HEENT: No Headaches, Difficulty Swallowing,Tooth/Dental Problems,Sore Throat,  No Sneezing, Rhinitis, Ear Ache, Nasal Congestion, or Post Nasal Drip,  Cardio-vascular:  No Chest pain, Orthopnea, PND, Edema in lower extremities, Anasarca, Dizziness, Palpitations  Resp: No Dyspnea, No DOE, No Productive Cough, No Non-Productive Cough, No Hemoptysis, No Change in Color of Mucus,  No Wheezing.    GI: No Heartburn, Indigestion, Abdominal Pain, Nausea, Vomiting, Diarrhea,  No Hematemesis, +Hematochezia, No Melena,  No Loss of Appetite  GU: No Dysuria, Change in Color of Urine, No Urgency or Frequency.  No Flank pain.  Musculoskeletal: No Joint Pain or Swelling.  No Decreased Range of Motion. No Back Pain.  Neurologic: No Syncope, No Seizures, Muscle Weakness, Paresthesia, Vision Disturbance or Loss, No Diplopia, No Vertigo, No Difficulty Walking,  Skin: No Rash or Lesions. Psych: No Change in Mood or Affect. No Depression or Anxiety. No Memory loss. No Confusion or Hallucinations   Past Medical History  Diagnosis Date  . Hypertension   . Hyperlipidemia   . Morbid obesity    . Osteoarthritis   . Gout   . CVA (cerebral infarction)   . IBS (irritable bowel syndrome)   . Diverticulosis   . Esophageal stricture   . Colon polyps     hyperplastic  . Anxiety   . Hiatal hernia     4 cm  . Shingles 02/21/2011  . Skin cancer   . Breast cancer     right breast  . Dysrhythmia     dr bensimhon   . Blood transfusion   . Chronic kidney disease     occ uti's  . GERD (gastroesophageal reflux disease)   . PONV (postoperative nausea and vomiting)   . DVT of leg (deep venous thrombosis)     left; S/P OR  . Atrial flutter     "sometimes"  . Bronchitis   . Pneumonia     "walking"  . Shortness of breath on exertion   . Anemia   . Stroke 2002    residual:  "little tingling in palm of left hand"  . Kidney stones 1990's  . UTI (lower urinary tract infection)     "I've had a few"  . History of radiation therapy 02/15/12-04/02/12    right breast 4680 cGy/26 sessions, right boost=1400cGy /7 sessions      Past Surgical History  Procedure Laterality Date  . Abdominal hysterectomy    . Bile duct stent placement  ~ 01/2011  . Pelvic bone tumor removal      twice in 2000's  . Colonic perforation repair  ~ 2000  . Total hip arthroplasty  1980's  right  . Joint replacement    . Colon surgery      hole in intestine ,tumors?  . Tonsillectomy  ~ 1946  . Cholecystectomy  03/23/11    lap chole   . Replacement total knee bilateral  ~ 2008; ~ 2010    right; left  . Cataract extraction w/ intraocular lens  implant, bilateral  1990's  . Hemorrhoid surgery  2012  . Breast lumpectomy  06/29/11    right  . Breast lumpectomy  06/29/2011    Procedure: BREAST LUMPECTOMY WITH EXCISION OF SENTINEL NODE;  Surgeon: Pedro Earls, MD;  Location: [facility];  Service: General;  Laterality: Right;  right sentinel node mapping,right sentinel node biopsy, needle localization right breast lumpectomy  . Incision and drainage breast abscess      right breast seroma  . Incision and  drainage abscess N/A 08/12/2013    Procedure: INCISION AND DRAINAGE ABSCESS;  Surgeon: Earnstine Regal, MD;  Location: [facility];  Service: General;  Laterality: N/A;  . Irrigation and debridement abscess N/A 08/14/2013    Procedure: Debridment of perineal tissue and debridement of perineal wound;  Surgeon: Earnstine Regal, MD;  Location: [facility];  Service: General;  Laterality: N/A;  . Irrigation and debridement abscess N/A 08/16/2013    Procedure: DRESSING CHANGE AND DEBRIDEMENT OF PERINEUM WOUND;  Surgeon: Earnstine Regal, MD;  Location: [facility];  Service: General;  Laterality: N/A;  . Pilonidal cyst drainage N/A 08/19/2013    Procedure: Dressing change perineum;  Surgeon: Ralene Ok, MD;  Location: [facility];  Service: General;  Laterality: N/A;  . Minor application of wound vac N/A 08/20/2013    Procedure: MINOR APPLICATION OF WOUND VAC;  Surgeon: Ralene Ok, MD;  Location: [facility];  Service: General;  Laterality: N/A;  . Incision and drainage abscess N/A 08/20/2013    Procedure: INCISION AND DRAINAGE ABSCESS;  Surgeon: Ralene Ok, MD;  Location: [facility];  Service: General;  Laterality: N/A;       Prior to Admission medications   Medication Sig Start Date End Date Taking? Authorizing Provider  allopurinol (ZYLOPRIM) 100 MG tablet Take 100-200 mg by mouth every other day. Take 100 mg and then 200 mg on alternating days.   Yes Historical Provider, MD  anastrozole (ARIMIDEX) 1 MG tablet Take 1 mg by mouth every morning.   Yes Historical Provider, MD  aspirin 325 MG EC tablet Take 325 mg by mouth every other day.   Yes Historical Provider, MD  aspirin EC 81 MG tablet Take 243 mg by mouth every other day.    Yes Historical Provider, MD  cholecalciferol (VITAMIN D) 1000 UNITS tablet Take 1,000 Units by mouth every morning.    Yes Historical Provider, MD  furosemide (LASIX) 40 MG tablet Take 40 mg by mouth every morning.   Yes Historical Provider, MD  lisinopril (PRINIVIL,ZESTRIL) 40 MG tablet Take 40 mg  by mouth daily.   Yes Historical Provider, MD  polyvinyl alcohol (LIQUIFILM TEARS) 1.4 % ophthalmic solution Place 2 drops into both eyes daily as needed for dry eyes.   Yes Historical Provider, MD  saccharomyces boulardii (FLORASTOR) 250 MG capsule Take 250 mg by mouth daily. 08/28/13  Yes Kinnie Feil, MD  famotidine (PEPCID) 20 MG tablet Take 1 tablet (20 mg total) by mouth daily. 08/29/13 09/05/13  Kinnie Feil, MD      Allergies  Allergen Reactions  . Ace Inhibitors Other (See Comments)    Tolerates lisinopril  at home. Pt does not recall allergy.  . Atorvastatin     REACTION: myalgias  . Oxycodone-Acetaminophen     REACTION: confusion, fatigue  . Rosuvastatin     REACTION: leg weakness  . Simvastatin     REACTION: leg weakness  . Codeine Nausea And Vomiting and Rash  . Sulfonamide Derivatives Hives and Rash     Social History:  reports that she has never smoked. She has never used smokeless tobacco. She reports that she does not drink alcohol or use illicit drugs.     Family History  Problem Relation Age of Onset  . Breast cancer Sister     x 2  . Diabetes Sister   . Prostate cancer Brother   . Colon cancer Neg Hx   . Stroke Mother   . Kidney failure Sister   . Heart disease Paternal Uncle     multiple  . Heart disease Maternal Uncle     multiple       Physical Exam:  GEN:  Pleasant Obese Elderly  80 y.o. Caucasian female  examined  and in no acute distress; cooperative with exam Filed Vitals:   10/10/13 1425 10/10/13 1427 10/10/13 1943  BP:  119/49 123/58  Pulse: 99  77  Temp: 98.3 F (36.8 C)  98.8 F (37.1 C)  TempSrc: Oral  Oral  Resp: 18  16  SpO2: 95%  95%   Blood pressure 123/58, pulse 77, temperature 98.8 F (37.1 C), temperature source Oral, resp. rate 16, SpO2 95.00%. PSYCH: She is alert and oriented x4; does not appear anxious does not appear depressed; affect is normal HEENT: Normocephalic and Atraumatic, Mucous membranes pink;  PERRLA; EOM intact; Fundi:  Benign;  No scleral icterus, Nares: Patent, Oropharynx: Clear, Fair Dentition, Neck:  FROM, no cervical lymphadenopathy nor thyromegaly or carotid bruit; no JVD; Breasts:: Not examined CHEST WALL: No tenderness CHEST: Normal respiration, clear to auscultation bilaterally HEART: Regular rate and rhythm; no murmurs rubs or gallops BACK: No kyphosis or scoliosis; no CVA tenderness ABDOMEN: Positive Bowel Sounds, Obese, soft non-tender; no masses, no organomegaly, no pannus; no intertriginous candida. Rectal Exam: Not done EXTREMITIES: No bone or joint deformity; age-appropriate arthropathy of the hands and knees; no cyanosis, clubbing or edema; no ulcerations. Genitalia: not examined PULSES: 2+ and symmetric SKIN: Normal hydration no rash or ulceration CNS:   Vascular: pulses palpable throughout    Labs on Admission:  Basic Metabolic Panel:  Recent Labs Lab 10/10/13 1449  NA 142  K 4.3  CL 101  CO2 26  GLUCOSE 146*  BUN 33*  CREATININE 1.31*  CALCIUM 9.8   Liver Function Tests:  Recent Labs Lab 10/10/13 1449  AST 20  ALT 14  ALKPHOS 83  BILITOT 0.6  PROT 6.7  ALBUMIN 2.8*   No results found for this basename: LIPASE, AMYLASE,  in the last 168 hours No results found for this basename: AMMONIA,  in the last 168 hours CBC:  Recent Labs Lab 10/10/13 1449  WBC 10.5  HGB 12.9  HCT 42.1  MCV 90.1  PLT 242   Cardiac Enzymes: No results found for this basename: CKTOTAL, CKMB, CKMBINDEX, TROPONINI,  in the last 168 hours  BNP (last 3 results)  Recent Labs  08/17/13 0528 08/26/13 0547  PROBNP 10639.0* 9195.0*   CBG: No results found for this basename: GLUCAP,  in the last 168 hours  Radiological Exams on Admission: Ct Abdomen Pelvis W Contrast  10/10/2013   CLINICAL DATA:  Perineal wound.  Bloody bowel movements.  EXAM: CT ABDOMEN AND PELVIS WITH CONTRAST  TECHNIQUE: Multidetector CT imaging of the abdomen and pelvis was performed  using the standard protocol following bolus administration of intravenous contrast.  CONTRAST:  192mL OMNIPAQUE IOHEXOL 300 MG/ML  SOLN  COMPARISON:  DG ABD PORTABLE 1V dated 08/14/2013; CT ABD/PELVIS W CM dated 12/11/2010; CT UROGRAM dated 01/19/2009  FINDINGS: No focal hepatic abnormality. Calcified splenic granulomas. 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 Pancreas normal. Surgical clips gallbladder fossa. Air in the biliary system. This may be from prior biliary intervention.  Adrenals normal. No focal renal abnormalities identified. Foley catheter in the bladder. Bladder is nondistended. No free pelvic fluid. Hysterectomy. Surgical clips within pelvis. Mild peroneal edema noted. No perineal mass lesion or abscess is identified.  No significant adenopathy. Aorta normal caliber and tortuous. Visceral vessels are patent.  What appears to be appendix is normal. No inflammatory changes noted in the right or left lower quadrant. Scattered diverticuli present. No evidence of diverticulitis. No evidence of bowel obstruction or free air. No mesenteric mass.  Heart size normal. Coronary artery disease. Mild basilar atelectasis. Abdominal wall intact. No significant hernia. Prior right hip replacement. Severe degenerative changes lumbar spine.  IMPRESSION: 1. Mild perineal edema, no focal peroneal mass or evidence of abscess. 2. Cholecystectomy. Air in the biliary system may be from prior biliary intervention. 3. Diverticulosis. 4. Coronary artery disease.   Electronically Signed   By: Marcello Moores  Register   On: 10/10/2013 18:17      Assessment/Plan:   81 y.o. female with  Principal Problem:   Rectal bleeding Active Problems:   GI bleed   AKI (acute kidney injury)   DIABETES MELLITUS, TYPE II   HYPERLIPIDEMIA   GOUT   HYPERTENSION   Breast cancer, right breast-lobular s/p lumpectomy and sentinel node biopsy   Hx of necrotizing fascIItis     1.    Rectal Bleeding- consistent with a Lower GI Bleed, Monitor  H/Hs,  Transfuse if needed, Patient is agreeable.   A GI consult is needed in the AM.      2.    AKI-  Due to factors of CKD along with GI bleeding.   Gentle Hydration, and Monitor H/Hs.     3.    CHF Hx-   On Lasix daily, IVFs with caution, and Monitor for Fluid Overload.     4.    DM2-   Borderline and Diet Controlled,  Monitor Glucose levels and SSI coverage PRN, and Check HbA1c.    5.    HTN-   Continue Lasix and Lisinopril Rx,    6.    Hyperlipidemia-   Intolerant to Statin Rx,  Diet Control Currently, Defer to PCP and further outpatient management.     7.    Gout- continue Allopurinol Daily.     8.    Breast Cancer Rt, Breast -  Hx of Radiation Rx,  continue Arimedex  rx daily.    9.    Hx of Necrotizing Fascitis -   Surgeries on  March 2,  4 , 10,  2015,  Wound Continuing to heal No signs of Abscess on CT scan,  Has indwelling Foley catheter while wound is healing. Continue wound Care and Foley.          Code Status:  FULL CODE Family Communication:    No Family Present Disposition Plan:   Inpatient  Time spent:  Post C Arnoldo Morale  [facility] Pager [phone]  If 7PM-7AM, please contact night-coverage [email or web address] Password [email or web address] 10/10/2013, 8:20 PM

## 2013-10-10 NOTE — ED Notes (Signed)
Change patient leg bag

## 2013-10-10 NOTE — ED Provider Notes (Signed)
CSN: [id number]     Arrival date & time 10/10/13  1421 History   First MD Initiated Contact with Patient 10/10/13 1541     Chief Complaint  Patient presents with  . Rectal Bleeding     (Consider location/radiation/quality/duration/timing/severity/associated sxs/prior Treatment) Patient is a 80 y.o. female presenting with hematochezia. The history is provided by the patient and the spouse.  Rectal Bleeding  patient here complaining of one-week history of rectal bleeding. Also has severe pain in her anus. History of perirectal abscess with surgical treatment one month ago. She denies any abdominal pain. No vomiting but some watery diarrhea noted. Low-grade temperature at home without  Denies any abdominal pain. Symptoms persisted and she saw her physician today. Patient had a rectal exam which was guaiac positive. She was sent here for further management. No treatment used prior to arrival. Nothing makes her symptoms better.  Past Medical History  Diagnosis Date  . Hypertension   . Hyperlipidemia   . Morbid obesity   . Osteoarthritis   . Gout   . CVA (cerebral infarction)   . IBS (irritable bowel syndrome)   . Diverticulosis   . Esophageal stricture   . Colon polyps     hyperplastic  . Anxiety   . Hiatal hernia     4 cm  . Shingles 02/21/2011  . Skin cancer   . Breast cancer     right breast  . Dysrhythmia     dr bensimhon   . Blood transfusion   . Chronic kidney disease     occ uti's  . GERD (gastroesophageal reflux disease)   . PONV (postoperative nausea and vomiting)   . DVT of leg (deep venous thrombosis)     left; S/P OR  . Atrial flutter     "sometimes"  . Bronchitis   . Pneumonia     "walking"  . Shortness of breath on exertion   . Anemia   . Stroke 2002    residual:  "little tingling in palm of left hand"  . Kidney stones 1990's  . UTI (lower urinary tract infection)     "I've had a few"  . History of radiation therapy 02/15/12-04/02/12    right breast 4680  cGy/26 sessions, right boost=1400cGy /7 sessions   Past Surgical History  Procedure Laterality Date  . Abdominal hysterectomy    . Bile duct stent placement  ~ 01/2011  . Pelvic bone tumor removal      twice in 2000's  . Colonic perforation repair  ~ 2000  . Total hip arthroplasty  1980's    right  . Joint replacement    . Colon surgery      hole in intestine ,tumors?  . Tonsillectomy  ~ 1946  . Cholecystectomy  03/23/11    lap chole   . Replacement total knee bilateral  ~ 2008; ~ 2010    right; left  . Cataract extraction w/ intraocular lens  implant, bilateral  1990's  . Hemorrhoid surgery  2012  . Breast lumpectomy  06/29/11    right  . Breast lumpectomy  06/29/2011    Procedure: BREAST LUMPECTOMY WITH EXCISION OF SENTINEL NODE;  Surgeon: Pedro Earls, MD;  Location: [place] [facility];  Service: General;  Laterality: Right;  right sentinel node mapping,right sentinel node biopsy, needle localization right breast lumpectomy  . Incision and drainage breast abscess      right breast seroma  . Incision and drainage abscess N/A 08/12/2013    Procedure: INCISION  AND DRAINAGE ABSCESS;  Surgeon: Earnstine Regal, MD;  Location: [facility];  Service: General;  Laterality: N/A;  . Irrigation and debridement abscess N/A 08/14/2013    Procedure: Debridment of perineal tissue and debridement of perineal wound;  Surgeon: Earnstine Regal, MD;  Location: [facility];  Service: General;  Laterality: N/A;  . Irrigation and debridement abscess N/A 08/16/2013    Procedure: DRESSING CHANGE AND DEBRIDEMENT OF PERINEUM WOUND;  Surgeon: Earnstine Regal, MD;  Location: [facility];  Service: General;  Laterality: N/A;  . Pilonidal cyst drainage N/A 08/19/2013    Procedure: Dressing change perineum;  Surgeon: Ralene Ok, MD;  Location: [facility];  Service: General;  Laterality: N/A;  . Minor application of wound vac N/A 08/20/2013    Procedure: MINOR APPLICATION OF WOUND VAC;  Surgeon: Ralene Ok, MD;  Location: [facility];  Service:  General;  Laterality: N/A;  . Incision and drainage abscess N/A 08/20/2013    Procedure: INCISION AND DRAINAGE ABSCESS;  Surgeon: Ralene Ok, MD;  Location: [facility];  Service: General;  Laterality: N/A;   Family History  Problem Relation Age of Onset  . Breast cancer Sister     x 2  . Diabetes Sister   . Prostate cancer Brother   . Colon cancer Neg Hx   . Stroke Mother   . Kidney failure Sister   . Heart disease Paternal Uncle     multiple  . Heart disease Maternal Uncle     multiple   History  Substance Use Topics  . Smoking status: Never Smoker   . Smokeless tobacco: Never Used  . Alcohol Use: No   OB History   Grav Para Term Preterm Abortions TAB SAB Ect Mult Living                 Review of Systems  Gastrointestinal: Positive for hematochezia.  All other systems reviewed and are negative.     Allergies  Ace inhibitors; Atorvastatin; Oxycodone-acetaminophen; Rosuvastatin; Simvastatin; Codeine; and Sulfonamide derivatives  Home Medications   Prior to Admission medications   Medication Sig Start Date End Date Taking? Authorizing Provider  allopurinol (ZYLOPRIM) 100 MG tablet Take 100-200 mg by mouth every other day. Take 100 mg and then 200 mg on alternating days.   Yes Historical Provider, MD  anastrozole (ARIMIDEX) 1 MG tablet Take 1 mg by mouth every morning.   Yes Historical Provider, MD  aspirin 325 MG EC tablet Take 325 mg by mouth every other day.   Yes Historical Provider, MD  aspirin EC 81 MG tablet Take 243 mg by mouth every other day.    Yes Historical Provider, MD  cholecalciferol (VITAMIN D) 1000 UNITS tablet Take 1,000 Units by mouth every morning.    Yes Historical Provider, MD  furosemide (LASIX) 40 MG tablet Take 40 mg by mouth every morning.   Yes Historical Provider, MD  lisinopril (PRINIVIL,ZESTRIL) 40 MG tablet Take 40 mg by mouth daily.   Yes Historical Provider, MD  polyvinyl alcohol (LIQUIFILM TEARS) 1.4 % ophthalmic solution Place 2 drops  into both eyes daily as needed for dry eyes.   Yes Historical Provider, MD  saccharomyces boulardii (FLORASTOR) 250 MG capsule Take 250 mg by mouth daily. 08/28/13  Yes Kinnie Feil, MD  famotidine (PEPCID) 20 MG tablet Take 1 tablet (20 mg total) by mouth daily. 08/29/13 09/05/13  Kinnie Feil, MD   BP 119/49  Pulse 99  Temp(Src) 98.3 F (36.8 C) (Oral)  Resp  18  SpO2 95% Physical Exam  Nursing note and vitals reviewed. Constitutional: She is oriented to person, place, and time. She appears well-developed and well-nourished.  Non-toxic appearance. No distress.  HENT:  Head: Normocephalic and atraumatic.  Eyes: Conjunctivae, EOM and lids are normal. Pupils are equal, round, and reactive to light.  Neck: Normal range of motion. Neck supple. No tracheal deviation present. No mass present.  Cardiovascular: Normal rate, regular rhythm and normal heart sounds.  Exam reveals no gallop.   No murmur heard. Pulmonary/Chest: Effort normal and breath sounds normal. No stridor. No respiratory distress. She has no decreased breath sounds. She has no wheezes. She has no rhonchi. She has no rales.  Abdominal: Soft. Normal appearance and bowel sounds are normal. She exhibits no distension. There is no tenderness. There is no rebound and no CVA tenderness.  Genitourinary:    There is no rash on the left labia.  No anal drainage noted.  Musculoskeletal: Normal range of motion. She exhibits no edema and no tenderness.  Neurological: She is alert and oriented to person, place, and time. She has normal strength. No cranial nerve deficit or sensory deficit. GCS eye subscore is 4. GCS verbal subscore is 5. GCS motor subscore is 6.  Skin: Skin is warm and dry. No abrasion and no rash noted.  Psychiatric: She has a normal mood and affect. Her speech is normal and behavior is normal.    ED Course  Procedures (including critical care time) Labs Review Labs Reviewed  CBC - Abnormal; Notable for the  following:    RDW 15.6 (*)    All other components within normal limits  COMPREHENSIVE METABOLIC PANEL - Abnormal; Notable for the following:    Glucose, Bld 146 (*)    BUN 33 (*)    Creatinine, Ser 1.31 (*)    Albumin 2.8 (*)    GFR calc non Af Amer 37 (*)    GFR calc Af Amer 43 (*)    All other components within normal limits  PROTIME-INR  APTT  POC OCCULT BLOOD, ED  TYPE AND SCREEN    Imaging Review No results found.   EKG Interpretation None      MDM   Final diagnoses:  None    Patient to be admitted for evaluation of gastrointestinal bleeding.    Leota Jacobsen, MD 10/10/13 [phone]

## 2013-10-10 NOTE — Progress Notes (Signed)
   Subjective:    Patient ID: [name], female    DOB: 1930/05/13, 80 y.o.   MRN: [id number]  HPI She has had rectal bleeding with each bowel movement for the last week. It is painless but she describes severe tenderness in the rectal area, worse sitting. The bleeding is significant & actually discolors the toilet water. Her stools are described as "runny"and intermittently melenous in character  Her significant past history includes necrotizing fasciitis of perineum. She had incision and drainage on 5 occasions in March of this year. She continues to have wound care as an outpatient  Significant past history includes diverticulitis with bleeding. Colonoscopy in 2012 revealed diffuse diverticulosis as well as an external hemorrhoid.  In 2004 showed hyperplastic polyp and localized diverticulosis in the sigmoid colon.    Review of Systems   She denies epistaxis, hemoptysis,or hematuria.She has no unexplained weight loss, dysphagia, or abdominal pain.   She has minor  bruising . She  has no difficulty stopping bleeding with injury. She has had low-grade fever up to 99 last night without associated chills or sweats. She's not had significant weight loss.         Objective:   Physical Exam General appearance: adequate nourishment w/o distress. Very frail ; needs support standing  Eyes: No conjunctival inflammation or scleral icterus is present.  Oral exam: Dentures; lips and gums are healthy appearing.There is no oropharyngeal erythema or exudate noted.   Heart:  Slight increased rate and regular rhythm. S1 and S2 normal without gallop,  click, rub or other extra sounds .Flow murmur   Lungs:Chest clear to auscultation; no wheezes, rhonchi,rales ,or rubs present.No increased work of breathing.   Abdomen: Protuberant ;bowel sounds decreased, soft and non-tender without masses, organomegaly or hernias noted.  No guarding or rebound .  She  was exquisitely tender @ the rectal exam  . Only mucoid material was obtained but this was strongly positive by fecal occult testing . Dressings on perinem not stool soiled Musculoskeletal:  Weak ,using rolling walking  Skin:Warm & dry. Palllor.  Intact without suspicious lesions or rashes ; no jaundice . Slight tenting  Lymphatic: No lymphadenopathy is noted about the head, neck, axilla areas.                Assessment & Plan:  #1 Rectal bleeding; most likely diverticular  Plan: ER asssessment. This patient has been septic and ventilator dependent; she is  @ high risk for recurrent sepsis with the perineal wounds & the present GI situation. She'll be sent to the emergency room with a request for at least Observation.

## 2013-10-10 NOTE — Progress Notes (Signed)
Pt received on [name]. Alert and oriented and in no distress of any sort at this time. Upon assessment surgical wound noted to right groin on to rectum. Purulent, foul-smelling drainage noted from wound site. Site cleansed with normal saline and saline-moistened gauze applied to site. Pt toelrated dsg change. Resting comfortably in bed at the moment. Madelin Headings

## 2013-10-11 ENCOUNTER — Telehealth: Payer: Self-pay

## 2013-10-11 DIAGNOSIS — K625 Hemorrhage of anus and rectum: Secondary | ICD-10-CM

## 2013-10-11 DIAGNOSIS — E43 Unspecified severe protein-calorie malnutrition: Secondary | ICD-10-CM | POA: Insufficient documentation

## 2013-10-11 DIAGNOSIS — N183 Chronic kidney disease, stage 3 unspecified: Secondary | ICD-10-CM

## 2013-10-11 DIAGNOSIS — E119 Type 2 diabetes mellitus without complications: Secondary | ICD-10-CM

## 2013-10-11 DIAGNOSIS — I5032 Chronic diastolic (congestive) heart failure: Secondary | ICD-10-CM

## 2013-10-11 LAB — CBC
HCT: 36.3 % (ref 36.0–46.0)
Hemoglobin: 11.4 g/dL — ABNORMAL LOW (ref 12.0–15.0)
MCH: 27.9 pg (ref 26.0–34.0)
MCHC: 31.4 g/dL (ref 30.0–36.0)
MCV: 89 fL (ref 78.0–100.0)
Platelets: 187 10*3/uL (ref 150–400)
RBC: 4.08 MIL/uL (ref 3.87–5.11)
RDW: 15.5 % (ref 11.5–15.5)
WBC: 7.5 10*3/uL (ref 4.0–10.5)

## 2013-10-11 LAB — GLUCOSE, CAPILLARY
Glucose-Capillary: 111 mg/dL — ABNORMAL HIGH (ref 70–99)
Glucose-Capillary: 125 mg/dL — ABNORMAL HIGH (ref 70–99)
Glucose-Capillary: 173 mg/dL — ABNORMAL HIGH (ref 70–99)
Glucose-Capillary: 154 mg/dL — ABNORMAL HIGH (ref 70–99)
Glucose-Capillary: 174 mg/dL — ABNORMAL HIGH (ref 70–99)

## 2013-10-11 LAB — HEMOGLOBIN A1C
Hgb A1c MFr Bld: 6.7 % — ABNORMAL HIGH (ref <5.7)
Mean Plasma Glucose: 146 mg/dL — ABNORMAL HIGH (ref <117)

## 2013-10-11 LAB — HEMOGLOBIN AND HEMATOCRIT, BLOOD
HCT: 38.9 % (ref 36.0–46.0)
HCT: 38.3 % (ref 36.0–46.0)
Hemoglobin: 12.2 g/dL (ref 12.0–15.0)
Hemoglobin: 12 g/dL (ref 12.0–15.0)

## 2013-10-11 LAB — BASIC METABOLIC PANEL
BUN: 30 mg/dL — ABNORMAL HIGH (ref 6–23)
CO2: 24 mEq/L (ref 19–32)
Calcium: 8.9 mg/dL (ref 8.4–10.5)
Chloride: 102 mEq/L (ref 96–112)
Creatinine, Ser: 1.27 mg/dL — ABNORMAL HIGH (ref 0.50–1.10)
GFR calc Af Amer: 45 mL/min — ABNORMAL LOW (ref >90)
GFR calc non Af Amer: 38 mL/min — ABNORMAL LOW (ref >90)
Glucose, Bld: 110 mg/dL — ABNORMAL HIGH (ref 70–99)
Potassium: 3.8 mEq/L (ref 3.7–5.3)
Sodium: 141 mEq/L (ref 137–147)

## 2013-10-11 LAB — CLOSTRIDIUM DIFFICILE BY PCR: Toxigenic C. Difficile by PCR: POSITIVE — AB

## 2013-10-11 MED ORDER — ENSURE COMPLETE PO LIQD
237.0000 mL | Freq: Three times a day (TID) | ORAL | Status: DC
  Administered 2013-10-11 – 2013-10-14 (×9): 237 mL via ORAL

## 2013-10-11 MED ORDER — HYDROCORTISONE ACETATE 25 MG RE SUPP
25.0000 mg | Freq: Every day | RECTAL | Status: DC
  Administered 2013-10-11 – 2013-10-14 (×4): 25 mg via RECTAL
  Filled 2013-10-11 (×4): qty 1

## 2013-10-11 MED ORDER — LORAZEPAM 2 MG/ML IJ SOLN
0.5000 mg | Freq: Once | INTRAMUSCULAR | Status: AC
  Administered 2013-10-11: 0.5 mg via INTRAVENOUS
  Filled 2013-10-11: qty 1

## 2013-10-11 MED ORDER — METRONIDAZOLE 500 MG PO TABS
500.0000 mg | ORAL_TABLET | Freq: Three times a day (TID) | ORAL | Status: DC
  Administered 2013-10-11 – 2013-10-14 (×9): 500 mg via ORAL
  Filled 2013-10-11 (×12): qty 1

## 2013-10-11 NOTE — Progress Notes (Signed)
Pt had a BM and stained gauze dsg with BM. Site cleansed and new dsg applied. Madelin Headings

## 2013-10-11 NOTE — Consult Note (Signed)
Consultation  Referring Provider: [facility] (Dr. Carles Collet)  Primary Care Physician:  [name], MD Primary Gastroenterologist: Scarlette Shorts, MD Reason for Consultation:   Rectal bleeding           HPI:   [name] is a 80 y.o. female with multiple medical problems. She is known to [place] for history of colon polyps choledocholithiasis, s/p ERCP stone extraction / sphincterotomy. She was hospitalized August 2012 for minor rectal bleeding and underwent complete colonoscopy with Dr. Ardis Hughs who found diverticulosis and internal / external hemorrhoids felt to be source of bleeding. Readmitted July 2013 with hematochezia. Bleeding felt to be diverticular in nature, transfusion wasn't required.   Patient hospitalized in March of this year with cellulitis of perineum, especially right labia and right buttock. She has required severel I&Ds and ongoing wound care.   Patient admitted yesterday with several day history of painless rectal bleeding with BMs. Stools loose, mucoid since perineal surgeries.   Past Medical History  Diagnosis Date  . Hypertension   . Hyperlipidemia   . Morbid obesity   . Osteoarthritis   . Gout   . CVA (cerebral infarction)   . IBS (irritable bowel syndrome)   . Diverticulosis   . Esophageal stricture   . Colon polyps     hyperplastic  . Anxiety   . Hiatal hernia     4 cm  . Shingles 02/21/2011  . Skin cancer   . Breast cancer     right breast  . Dysrhythmia     dr bensimhon   . Blood transfusion   . Chronic kidney disease     occ uti's  . GERD (gastroesophageal reflux disease)   . PONV (postoperative nausea and vomiting)   . DVT of leg (deep venous thrombosis)     left; S/P OR  . Atrial flutter     "sometimes"  . Bronchitis   . Pneumonia     "walking"  . Shortness of breath on exertion   . Anemia   . Stroke 2002    residual:  "little tingling in palm of left hand"  . Kidney stones 1990's  . UTI (lower urinary tract infection)     "I've  had a few"  . History of radiation therapy 02/15/12-04/02/12    right breast 4680 cGy/26 sessions, right boost=1400cGy /7 sessions    Past Surgical History  Procedure Laterality Date  . Abdominal hysterectomy    . Bile duct stent placement  ~ 01/2011  . Pelvic bone tumor removal      twice in 2000's  . Colonic perforation repair  ~ 2000  . Total hip arthroplasty  1980's    right  . Joint replacement    . Colon surgery      hole in intestine ,tumors?  . Tonsillectomy  ~ 1946  . Cholecystectomy  03/23/11    lap chole   . Replacement total knee bilateral  ~ 2008; ~ 2010    right; left  . Cataract extraction w/ intraocular lens  implant, bilateral  1990's  . Hemorrhoid surgery  2012  . Breast lumpectomy  06/29/11    right  . Breast lumpectomy  06/29/2011    Procedure: BREAST LUMPECTOMY WITH EXCISION OF SENTINEL NODE;  Surgeon: Pedro Earls, MD;  Location: [facility];  Service: General;  Laterality: Right;  right sentinel node mapping,right sentinel node biopsy, needle localization right breast lumpectomy  . Incision and drainage breast abscess  right breast seroma  . Incision and drainage abscess N/A 08/12/2013    Procedure: INCISION AND DRAINAGE ABSCESS;  Surgeon: Earnstine Regal, MD;  Location: [facility];  Service: General;  Laterality: N/A;  . Irrigation and debridement abscess N/A 08/14/2013    Procedure: Debridment of perineal tissue and debridement of perineal wound;  Surgeon: Earnstine Regal, MD;  Location: [facility];  Service: General;  Laterality: N/A;  . Irrigation and debridement abscess N/A 08/16/2013    Procedure: DRESSING CHANGE AND DEBRIDEMENT OF PERINEUM WOUND;  Surgeon: Earnstine Regal, MD;  Location: [facility];  Service: General;  Laterality: N/A;  . Pilonidal cyst drainage N/A 08/19/2013    Procedure: Dressing change perineum;  Surgeon: Ralene Ok, MD;  Location: [facility];  Service: General;  Laterality: N/A;  . Minor application of wound vac N/A 08/20/2013    Procedure: MINOR  APPLICATION OF WOUND VAC;  Surgeon: Ralene Ok, MD;  Location: [facility];  Service: General;  Laterality: N/A;  . Incision and drainage abscess N/A 08/20/2013    Procedure: INCISION AND DRAINAGE ABSCESS;  Surgeon: Ralene Ok, MD;  Location: [facility];  Service: General;  Laterality: N/A;    Family History  Problem Relation Age of Onset  . Breast cancer Sister     x 2  . Diabetes Sister   . Prostate cancer Brother   . Colon cancer Neg Hx   . Stroke Mother   . Kidney failure Sister   . Heart disease Paternal Uncle     multiple  . Heart disease Maternal Uncle     multiple     History  Substance Use Topics  . Smoking status: Never Smoker   . Smokeless tobacco: Never Used  . Alcohol Use: No    Prior to Admission medications   Medication Sig Start Date End Date Taking? Authorizing Provider  allopurinol (ZYLOPRIM) 100 MG tablet Take 100-200 mg by mouth every other day. Take 100 mg and then 200 mg on alternating days.   Yes Historical Provider, MD  anastrozole (ARIMIDEX) 1 MG tablet Take 1 mg by mouth every morning.   Yes Historical Provider, MD  aspirin 325 MG EC tablet Take 325 mg by mouth every other day.   Yes Historical Provider, MD  aspirin EC 81 MG tablet Take 243 mg by mouth every other day.    Yes Historical Provider, MD  cholecalciferol (VITAMIN D) 1000 UNITS tablet Take 1,000 Units by mouth every morning.    Yes Historical Provider, MD  furosemide (LASIX) 40 MG tablet Take 40 mg by mouth every morning.   Yes Historical Provider, MD  lisinopril (PRINIVIL,ZESTRIL) 40 MG tablet Take 40 mg by mouth daily.   Yes Historical Provider, MD  polyvinyl alcohol (LIQUIFILM TEARS) 1.4 % ophthalmic solution Place 2 drops into both eyes daily as needed for dry eyes.   Yes Historical Provider, MD  saccharomyces boulardii (FLORASTOR) 250 MG capsule Take 250 mg by mouth daily. 08/28/13  Yes Kinnie Feil, MD  famotidine (PEPCID) 20 MG tablet Take 1 tablet (20 mg total) by mouth daily.  08/29/13 09/05/13  Kinnie Feil, MD    Current Facility-Administered Medications  Medication Dose Route Frequency Provider Last Rate Last Dose  . acetaminophen (TYLENOL) tablet 650 mg  650 mg Oral Q6H PRN Theressa Millard, MD       Or  . acetaminophen (TYLENOL) suppository 650 mg  650 mg Rectal Q6H PRN Theressa Millard, MD      . [  START ON 10/12/2013] allopurinol (ZYLOPRIM) tablet 100 mg  100 mg Oral QODAY Harvette Evonnie Dawes, MD      . allopurinol (ZYLOPRIM) tablet 200 mg  200 mg Oral QODAY Theressa Millard, MD   200 mg at 10/11/13 1035  . alum & mag hydroxide-simeth (MAALOX/MYLANTA) 200-200-20 MG/5ML suspension 30 mL  30 mL Oral Q6H PRN Theressa Millard, MD      . anastrozole (ARIMIDEX) tablet 1 mg  1 mg Oral q morning - 10a Theressa Millard, MD   1 mg at 10/11/13 1036  . furosemide (LASIX) tablet 40 mg  40 mg Oral q morning - 10a Theressa Millard, MD   40 mg at 10/11/13 1035  . HYDROmorphone (DILAUDID) injection 0.5-1 mg  0.5-1 mg Intravenous Q3H PRN Theressa Millard, MD      . insulin aspart (novoLOG) injection 0-5 Units  0-5 Units Subcutaneous QHS Harvette C Jenkins, MD      . insulin aspart (novoLOG) injection 0-9 Units  0-9 Units Subcutaneous TID WC Harvette Evonnie Dawes, MD      . ondansetron (ZOFRAN) tablet 4 mg  4 mg Oral Q6H PRN Theressa Millard, MD       Or  . ondansetron (ZOFRAN) injection 4 mg  4 mg Intravenous Q6H PRN Theressa Millard, MD      . pantoprazole (PROTONIX) injection 40 mg  40 mg Intravenous Q12H Theressa Millard, MD   40 mg at 10/11/13 1035  . polyvinyl alcohol (LIQUIFILM TEARS) 1.4 % ophthalmic solution 2 drop  2 drop Both Eyes Daily PRN Theressa Millard, MD        Allergies as of 10/10/2013 - Review Complete 10/10/2013  Allergen Reaction Noted  . Ace inhibitors Other (See Comments) 06/29/2011  . Atorvastatin  08/18/2010  . Oxycodone-acetaminophen  04/08/2008  . Rosuvastatin  10/17/2007  . Simvastatin  10/17/2007  . Codeine Nausea And  Vomiting and Rash 02/10/2007  . Sulfonamide derivatives Hives and Rash 02/10/2007    Review of Systems:    All systems reviewed and negative except where noted in HPI.   Physical Exam:  Vital signs in last 24 hours: Temp:  [97.5 F (36.4 C)-98.8 F (37.1 C)] 97.5 F (36.4 C) (05/01 0621) Pulse Rate:  [72-106] 81 (05/01 0621) Resp:  [14-18] 18 (05/01 0621) BP: (109-150)/(42-68) 109/49 mmHg (05/01 0621) SpO2:  [89 %-99 %] 89 % (05/01 0621) Weight:  [229 lb (103.874 kg)-229 lb 9.6 oz (104.146 kg)] 229 lb (103.874 kg) (04/30 2021) Last BM Date: 10/11/13 General:   Pleasant white female in NAD Head:  Normocephalic and atraumatic. Eyes:   No icterus.   Conjunctiva pink. Ears:  Normal auditory acuity. Neck:  Supple; no masses felt Lungs:  Respirations even and unlabored. Lungs clear to auscultation bilaterally.   No wheezes, crackles, or rhonchi.  Heart:  Regular rate and rhythm Abdomen:  Soft, nondistended, nontender. Normal bowel sounds. No appreciable masses or hepatomegaly.  Rectal:  Mild edema to right of anus where extensive perineal wound starts. No blood in vault on DRE. Slight mucous but appears to originate externally (? maybe from edematous area).  Msk:  Symmetrical without gross deformities.  Extremities:  Without edema. Neurologic:  Alert and  oriented x4;  grossly normal neurologically. Skin:  Intact without significant lesions or rashes. Cervical Nodes:  No significant cervical adenopathy. Psych:  Alert and cooperative. Normal affect.  LAB RESULTS:  Recent Labs  10/10/13 1449 10/10/13 2203 10/11/13 0456  WBC 10.5  --  7.5  HGB 12.9 11.0* 11.4*  HCT 42.1 36.5 36.3  PLT 242  --  187   BMET  Recent Labs  10/10/13 1449 10/11/13 0456  NA 142 141  K 4.3 3.8  CL 101 102  CO2 26 24  GLUCOSE 146* 110*  BUN 33* 30*  CREATININE 1.31* 1.27*  CALCIUM 9.8 8.9   LFT  Recent Labs  10/10/13 1449  PROT 6.7  ALBUMIN 2.8*  AST 20  ALT 14  ALKPHOS 83    BILITOT 0.6   PT/INR  Recent Labs  10/10/13 1444  LABPROT 13.3  INR 1.03    STUDIES: Ct Abdomen Pelvis W Contrast  10/10/2013   CLINICAL DATA:  Perineal wound.  Bloody bowel movements.  EXAM: CT ABDOMEN AND PELVIS WITH CONTRAST  TECHNIQUE: Multidetector CT imaging of the abdomen and pelvis was performed using the standard protocol following bolus administration of intravenous contrast.  CONTRAST:  163mL OMNIPAQUE IOHEXOL 300 MG/ML  SOLN  COMPARISON:  DG ABD PORTABLE 1V dated 08/14/2013; CT ABD/PELVIS W CM dated 12/11/2010; CT UROGRAM dated 01/19/2009  FINDINGS: No focal hepatic abnormality. Calcified splenic granulomas. 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 Pancreas normal. Surgical clips gallbladder fossa. Air in the biliary system. This may be from prior biliary intervention.  Adrenals normal. No focal renal abnormalities identified. Foley catheter in the bladder. Bladder is nondistended. No free pelvic fluid. Hysterectomy. Surgical clips within pelvis. Mild peroneal edema noted. No perineal mass lesion or abscess is identified.  No significant adenopathy. Aorta normal caliber and tortuous. Visceral vessels are patent.  What appears to be appendix is normal. No inflammatory changes noted in the right or left lower quadrant. Scattered diverticuli present. No evidence of diverticulitis. No evidence of bowel obstruction or free air. No mesenteric mass.  Heart size normal. Coronary artery disease. Mild basilar atelectasis. Abdominal wall intact. No significant hernia. Prior right hip replacement. Severe degenerative changes lumbar spine.  IMPRESSION: 1. Mild perineal edema, no focal peroneal mass or evidence of abscess. 2. Cholecystectomy. Air in the biliary system may be from prior biliary intervention. 3. Diverticulosis. 4. Coronary artery disease.   Electronically Signed   By: Marcello Moores  Register   On: 10/10/2013 18:17    PREVIOUS ENDOSCOPIES:            Colonoscopy 2012, see HPI   Impression /  Plan:   52. 80 year old female with low volume rectal bleeding with BMs. She does have known history of internal hemorrhoids which could be source of bleeding. Last colonoscopy was less than 3 years ago. Will try steroid suppositories to see if this helps.    2. History of necrotizing fasciitis of perineum, s/p several I&Ds early March of this year. She has required prolonged wound care and is followed by [facility].   3. Multiple medical problems. Hgb 11.4 which is stable at baseline  Thanks   LOS: 1 day   Willia Craze  10/11/2013, 11:15 AM   [facility] Attending  I have also seen and assessed the patient and agree with the above note. We think she has probably had bleeding from hemorrhoids. Do not plan on endoscopic evaluation at this point. If no more bleeding - only minor bleeding - home tomorrow on [facility] suppoitories.  Gatha Mayer, MD, [facility] [phone] (pager) 10/11/2013 3:14 PM

## 2013-10-11 NOTE — Care Management Note (Signed)
    Page 1 of 1   10/11/2013     3:24:44 PM CARE MANAGEMENT NOTE 10/11/2013  Patient:  [name],[name]   Account Number:  [id number]  Date Initiated:  10/11/2013  Documentation initiated by:  [facility]  Subjective/Objective Assessment:   80 year old female admitted with rectal bleeding.     Action/Plan:   From home with spouse. Has [facility] services for wound care. Want to continue to use their services at d/c.   Anticipated DC Date:  10/14/2013   Anticipated DC Plan:  [place]  CM consult      Choice offered to / List presented to:             [place]   Status of service:  In process, will continue to follow Medicare Important Message given?   (If response is "NO", the following Medicare IM given date fields will be blank) Date Medicare IM given:   Date Additional Medicare IM given:    Discharge Disposition:    Per UR Regulation:  Reviewed for med. necessity/level of care/duration of stay  If discussed at [place] of Stay Meetings, dates discussed:    Comments:  10/11/13 Allene Dillon RN BSN [phone] Met with pt and spouse at bedside to discuss d/c needs. She was active with [facility] for wound care prior to admission. They have been coming once [name] week and husband changes the dressing the rest of the time. She wants to resume their services at d/c. MD needs to reorder them.

## 2013-10-11 NOTE — Telephone Encounter (Signed)
Relevant patient education mailed to patient.  

## 2013-10-11 NOTE — Consult Note (Addendum)
WOC wound consult note Reason for Consult: Consult requested for right groin/perineal wound.  Pt had history of necrotizing fascitis in March and is followed was followed by [facility]. They have previously ordered moist gauze packing daily. Wound type: Full thickness; extends from anterior right groin to perineum. Pressure Ulcer POA: This is NOT a pressure ulcer. Measurement: 14X4X.5cm Wound bed: 100% beefy red Drainage (amount, consistency, odor) No odor, mod amt pink drainage Periwound: Intact skin surrounding Dressing procedure/placement/frequency: Continue present plan of care with moist gauze packing to promote healing.  Mesh underwear to hold in place.  Pt is followed by [facility] team as an outpatient for groin wound.  Please consult this team for further assessment while in the hospital if desired.   Please re-consult if further assistance is needed.  Thank-you,  Julien Girt MSN, [place], [place], [place], [place]

## 2013-10-11 NOTE — Progress Notes (Signed)
Patient ID: [name], female   DOB: Sep 07, 1929, 80 y.o.   MRN: [id number]  General Surgery - [facility] - Progress Note  Subjective: Patient on ward 3 [address] at [facility].  Notes intermittent bleeding with BM's.  Healing wounds in right groin and medial right buttock following extensive debridement for necrotizing fasciitis (Fournier's gangrene) in March 2015.  Objective: Vital signs in last 24 hours: Temp:  [97.5 F (36.4 C)-98.8 F (37.1 C)] 98.1 F (36.7 C) (05/01 1800) Pulse Rate:  [72-95] 84 (05/01 1800) Resp:  [16-18] 17 (05/01 1800) BP: (109-141)/(42-73) 135/70 mmHg (05/01 1800) SpO2:  [89 %-100 %] 94 % (05/01 1800) Weight:  [229 lb (103.874 kg)] 229 lb (103.874 kg) (04/30 2021) Last BM Date: 10/11/13  Intake/Output from previous day: 04/30 0701 - 05/01 0700 In: 761.3 [I.V.:761.3] Out: 300 [Urine:300]  Exam: HEENT - clear, not icteric Neck - soft Chest - clear bilaterally Cor - RRR, no murmur Abd - soft, obese, non-tender GU - right groin wound clean with healthy granulation tissue, minimal drainage, no bleeding; medial right buttock wound superficial with clean granulation tissue, minimal drainage, and no bleeding; few external anal skin tags - no thrombosis, no prolapse Ext - no significant edema Neuro - grossly intact, no focal deficits  Lab Results:   Recent Labs  10/10/13 1449  10/11/13 0456 10/11/13 1313  WBC 10.5  --  7.5  --   HGB 12.9  < > 11.4* 12.2  HCT 42.1  < > 36.3 38.9  PLT 242  --  187  --   < > = values in this interval not displayed.   Recent Labs  10/10/13 1449 10/11/13 0456  NA 142 141  K 4.3 3.8  CL 101 102  CO2 26 24  GLUCOSE 146* 110*  BUN 33* 30*  CREATININE 1.31* 1.27*  CALCIUM 9.8 8.9    Studies/Results: Ct Abdomen Pelvis W Contrast  10/10/2013   CLINICAL DATA:  Perineal wound.  Bloody bowel movements.  EXAM: CT ABDOMEN AND PELVIS WITH CONTRAST  TECHNIQUE: Multidetector CT imaging of the abdomen  and pelvis was performed using the standard protocol following bolus administration of intravenous contrast.  CONTRAST:  154mL OMNIPAQUE IOHEXOL 300 MG/ML  SOLN  COMPARISON:  DG ABD PORTABLE 1V dated 08/14/2013; CT ABD/PELVIS W CM dated 12/11/2010; CT UROGRAM dated 01/19/2009  FINDINGS: No focal hepatic abnormality. Calcified splenic granulomas. 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 Pancreas normal. Surgical clips gallbladder fossa. Air in the biliary system. This may be from prior biliary intervention.  Adrenals normal. No focal renal abnormalities identified. Foley catheter in the bladder. Bladder is nondistended. No free pelvic fluid. Hysterectomy. Surgical clips within pelvis. Mild peroneal edema noted. No perineal mass lesion or abscess is identified.  No significant adenopathy. Aorta normal caliber and tortuous. Visceral vessels are patent.  What appears to be appendix is normal. No inflammatory changes noted in the right or left lower quadrant. Scattered diverticuli present. No evidence of diverticulitis. No evidence of bowel obstruction or free air. No mesenteric mass.  Heart size normal. Coronary artery disease. Mild basilar atelectasis. Abdominal wall intact. No significant hernia. Prior right hip replacement. Severe degenerative changes lumbar spine.  IMPRESSION: 1. Mild perineal edema, no focal peroneal mass or evidence of abscess. 2. Cholecystectomy. Air in the biliary system may be from prior biliary intervention. 3. Diverticulosis. 4. Coronary artery disease.   Electronically Signed   By: Marcello Moores  Register   On: 10/10/2013 18:17  Assessment / Plan: 1.  Open wounds right groin and right medial buttock - healing by secondary intention  Continue twice daily dressing changes wet to dry with coarse mesh gauze and NS  Shower or Sitz bath daily 2.  Rectal bleeding  Likely hemorrhoidal - per GI recommendations  Call if surgical care needed.  Will see in office as scheduled.  Earnstine Regal, MD,  [facility] Office: [phone]  10/11/2013

## 2013-10-11 NOTE — Progress Notes (Signed)
PROGRESS NOTE  JANAVIA SPIZZIRRI [id number] DOB: 02-05-1930 DOA: 10/10/2013 PCP: [name], MD    Assessment/Plan: Rectal bleeding -02/09/2011 colonoscopy revealed moderate diverticulosis, internal and external hemorrhoids -? Hemorrhoidal bleed -10/10/2013 CT abdomen pelvis negative for abscess or diverticulitis -Consult [facility] -Mild drop in hemoglobin last night likely represents dilution from IVF -hold ASA CKD stage III -Baseline creatinine 1.0-1.3 -Saline lock IV fluids given recent CHF from last admission -Continue home dose of furosemide Diabetes mellitus type 2 -Hemoglobin A1c 7.7--08/12/2013 -NovoLog sliding scale Hypertension -Hold lisinopril as blood pressure is soft Chronic diastolic CHF -123XX123 echo--EF 0000000, grade 2 diastolic dysfunction History of necrotizing fasciitis--perineum -Consult wound care nurse  -Presently, wound has good granulation  -Maintain an indwelling Foley catheter  Hyperlipidemia  -Continue statin  Breast cancer history  -Continue Arimidex   Family Communication:   Pt at beside Disposition Plan:   Home when medically stable       Procedures/Studies: Ct Abdomen Pelvis W Contrast  10/10/2013   CLINICAL DATA:  Perineal wound.  Bloody bowel movements.  EXAM: CT ABDOMEN AND PELVIS WITH CONTRAST  TECHNIQUE: Multidetector CT imaging of the abdomen and pelvis was performed using the standard protocol following bolus administration of intravenous contrast.  CONTRAST:  172mL OMNIPAQUE IOHEXOL 300 MG/ML  SOLN  COMPARISON:  DG ABD PORTABLE 1V dated 08/14/2013; CT ABD/PELVIS W CM dated 12/11/2010; CT UROGRAM dated 01/19/2009  FINDINGS: No focal hepatic abnormality. Calcified splenic granulomas. 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 Pancreas normal. Surgical clips gallbladder fossa. Air in the biliary system. This may be from prior biliary intervention.  Adrenals normal. No focal renal abnormalities identified. Foley catheter in  the bladder. Bladder is nondistended. No free pelvic fluid. Hysterectomy. Surgical clips within pelvis. Mild peroneal edema noted. No perineal mass lesion or abscess is identified.  No significant adenopathy. Aorta normal caliber and tortuous. Visceral vessels are patent.  What appears to be appendix is normal. No inflammatory changes noted in the right or left lower quadrant. Scattered diverticuli present. No evidence of diverticulitis. No evidence of bowel obstruction or free air. No mesenteric mass.  Heart size normal. Coronary artery disease. Mild basilar atelectasis. Abdominal wall intact. No significant hernia. Prior right hip replacement. Severe degenerative changes lumbar spine.  IMPRESSION: 1. Mild perineal edema, no focal peroneal mass or evidence of abscess. 2. Cholecystectomy. Air in the biliary system may be from prior biliary intervention. 3. Diverticulosis. 4. Coronary artery disease.   Electronically Signed   By: Marcello Moores  Register   On: 10/10/2013 18:17         Subjective: Patient denies fevers, chills, headache, chest pain, dyspnea, nausea, vomiting, diarrhea, abdominal pain, dysuria, hematuria   Objective: Filed Vitals:   10/10/13 1943 10/10/13 2021 10/11/13 0218 10/11/13 0621  BP: 123/58 109/46 110/42 109/49  Pulse: 77 76 72 81  Temp: 98.8 F (37.1 C) 98.3 F (36.8 C) 98.2 F (36.8 C) 97.5 F (36.4 C)  TempSrc: Oral Oral Oral Oral  Resp: 16 16 18 18   Height:  5\' 6"  (1.676 m)    Weight:  103.874 kg (229 lb)    SpO2: 95% 97% 99% 89%    Intake/Output Summary (Last 24 hours) at 10/11/13 0812 Last data filed at 10/11/13 0742  Gross per 24 hour  Intake  822.5 ml  Output    300 ml  Net  522.5 ml   Weight change:  Exam:   General:  Pt is  alert, follows commands appropriately, not in acute distress  HEENT: No icterus, No thrush, [place]/AT  Cardiovascular: RRR, S1/S2, no rubs, no gallops  Respiratory: CTA bilaterally, no wheezing, no crackles, no rhonchi  Abdomen:  Soft/+BS, non tender, non distended, no guarding  Extremities: 1+LE edema, No lymphangitis, No petechiae, No rashes, no synovitis  Data Reviewed: Basic Metabolic Panel:  Recent Labs Lab 10/10/13 1449 10/11/13 0456  NA 142 141  K 4.3 3.8  CL 101 102  CO2 26 24  GLUCOSE 146* 110*  BUN 33* 30*  CREATININE 1.31* 1.27*  CALCIUM 9.8 8.9   Liver Function Tests:  Recent Labs Lab 10/10/13 1449  AST 20  ALT 14  ALKPHOS 83  BILITOT 0.6  PROT 6.7  ALBUMIN 2.8*   No results found for this basename: LIPASE, AMYLASE,  in the last 168 hours No results found for this basename: AMMONIA,  in the last 168 hours CBC:  Recent Labs Lab 10/10/13 1449 10/10/13 2203 10/11/13 0456  WBC 10.5  --  7.5  HGB 12.9 11.0* 11.4*  HCT 42.1 36.5 36.3  MCV 90.1  --  89.0  PLT 242  --  187   Cardiac Enzymes: No results found for this basename: CKTOTAL, CKMB, CKMBINDEX, TROPONINI,  in the last 168 hours BNP: No components found with this basename: POCBNP,  CBG:  Recent Labs Lab 10/10/13 2155  GLUCAP 109*    No results found for this or any previous visit (from the past 240 hour(s)).   Scheduled Meds: . [START ON 10/12/2013] allopurinol  100 mg Oral QODAY  . allopurinol  200 mg Oral QODAY  . anastrozole  1 mg Oral q morning - 10a  . furosemide  40 mg Oral q morning - 10a  . insulin aspart  0-5 Units Subcutaneous QHS  . insulin aspart  0-9 Units Subcutaneous TID WC  . lisinopril  40 mg Oral Daily  . pantoprazole (PROTONIX) IV  40 mg Intravenous Q12H   Continuous Infusions:    Orson Eva, DO  [facility] Pager [phone]  If 7PM-7AM, please contact night-coverage [email or web address] Password [email or web address] 10/11/2013, 8:12 AM   LOS: 1 day

## 2013-10-11 NOTE — Progress Notes (Signed)
INITIAL NUTRITION ASSESSMENT  Pt meets criteria for severe MALNUTRITION in the context of chronic illness as evidenced by 9% weight loss in the past 2 months with <75% estimated energy intake in the past month.  DOCUMENTATION CODES Per approved criteria  -Obesity Unspecified -Severe malnutrition in the context of chronic illness   INTERVENTION: - Ensure Complete TID - Encouraged increased meal intake - Will continue to monitor   NUTRITION DIAGNOSIS: Unintended weight loss related to poor appetite as evidenced by pt report, weight trend.   Goal: Pt to consume >90% of meals/supplements  Monitor:  Weights, labs, intake  Reason for Assessment: Malnutrition screening tool   80 y.o. female  Admitting Dx: Rectal bleeding  ASSESSMENT: Admitted with c/o rectal bleeding x 1 week. Hx of recent necrotizing fascitis of the perineum during admission in March 2015. Hx of HTN, HLD, CVA, IBS, diverticulosis, hiatal hernia, skin and breast CA, CKD, GERD, and esophageal stricture.   Pt known to RD from past admission. Met with pt who reports her appetite has been poor since d/c with pt typically consuming only 1 meal/day of whatever her husband would cook for her. States the most she's had to eat in the past month was some chicken from [facility]. States she didn't eat anything yesterday so she was very hungry today and was almost done with her lunch tray during visit, eating during conversation. Weight trend shows pt's weight down 23 pounds in the past 2 months which pt thinks is accurate due to poor appetite. Was not consuming any nutritional supplements at home.   BUN/Cr elevated with low GFR  Height: Ht Readings from Last 1 Encounters:  10/10/13 5' 6"  (1.676 m)    Weight: Wt Readings from Last 1 Encounters:  10/10/13 229 lb (103.874 kg)    Ideal Body Weight: 130 lbs   % Ideal Body Weight: 176%  Wt Readings from Last 10 Encounters:  10/10/13 229 lb (103.874 kg)  10/10/13 229 lb 9.6 oz  (104.146 kg)  10/03/13 229 lb 8 oz (104.101 kg)  09/12/13 233 lb (105.688 kg)  08/29/13 252 lb (114.306 kg)  08/29/13 252 lb (114.306 kg)  08/29/13 252 lb (114.306 kg)  08/29/13 252 lb (114.306 kg)  08/29/13 252 lb (114.306 kg)  08/29/13 252 lb (114.306 kg)    Usual Body Weight: 252 lbs   % Usual Body Weight: 91%  BMI:  Body mass index is 36.98 kg/(m^2). Class II obesity  Estimated Nutritional Needs: Kcal: 1550-1750 Protein: 70-85g Fluid: 1.5-1.7L/day  Skin: Full thickness right groin/perineal wound  Diet Order: Cardiac  EDUCATION NEEDS: -No education needs identified at this time   Intake/Output Summary (Last 24 hours) at 10/11/13 1418 Last data filed at 10/11/13 0742  Gross per 24 hour  Intake  822.5 ml  Output    300 ml  Net  522.5 ml    Last BM: 5/1  Labs:   Recent Labs Lab 10/10/13 1449 10/11/13 0456  NA 142 141  K 4.3 3.8  CL 101 102  CO2 26 24  BUN 33* 30*  CREATININE 1.31* 1.27*  CALCIUM 9.8 8.9  GLUCOSE 146* 110*    CBG (last 3)   Recent Labs  10/10/13 2155 10/11/13 0828 10/11/13 1202  GLUCAP 109* 111* 125*    Scheduled Meds: . [START ON 10/12/2013] allopurinol  100 mg Oral QODAY  . allopurinol  200 mg Oral QODAY  . anastrozole  1 mg Oral q morning - 10a  . furosemide  40 mg Oral  q morning - 10a  . hydrocortisone  25 mg Rectal Daily  . insulin aspart  0-5 Units Subcutaneous QHS  . insulin aspart  0-9 Units Subcutaneous TID WC  . metroNIDAZOLE  500 mg Oral 3 times per day  . pantoprazole (PROTONIX) IV  40 mg Intravenous Q12H    Continuous Infusions:   Past Medical History  Diagnosis Date  . Hypertension   . Hyperlipidemia   . Morbid obesity   . Osteoarthritis   . Gout   . CVA (cerebral infarction)   . IBS (irritable bowel syndrome)   . Diverticulosis   . Esophageal stricture   . Colon polyps     hyperplastic  . Anxiety   . Hiatal hernia     4 cm  . Shingles 02/21/2011  . Skin cancer   . Breast cancer     right  breast  . Dysrhythmia     dr bensimhon   . Blood transfusion   . Chronic kidney disease     occ uti's  . GERD (gastroesophageal reflux disease)   . PONV (postoperative nausea and vomiting)   . DVT of leg (deep venous thrombosis)     left; S/P OR  . Atrial flutter     "sometimes"  . Bronchitis   . Pneumonia     "walking"  . Shortness of breath on exertion   . Anemia   . Stroke 2002    residual:  "little tingling in palm of left hand"  . Kidney stones 1990's  . UTI (lower urinary tract infection)     "I've had a few"  . History of radiation therapy 02/15/12-04/02/12    right breast 4680 cGy/26 sessions, right boost=1400cGy /7 sessions    Past Surgical History  Procedure Laterality Date  . Abdominal hysterectomy    . Bile duct stent placement  ~ 01/2011  . Pelvic bone tumor removal      twice in 2000's  . Colonic perforation repair  ~ 2000  . Total hip arthroplasty  1980's    right  . Joint replacement    . Colon surgery      hole in intestine ,tumors?  . Tonsillectomy  ~ 1946  . Cholecystectomy  03/23/11    lap chole   . Replacement total knee bilateral  ~ 2008; ~ 2010    right; left  . Cataract extraction w/ intraocular lens  implant, bilateral  1990's  . Hemorrhoid surgery  2012  . Breast lumpectomy  06/29/11    right  . Breast lumpectomy  06/29/2011    Procedure: BREAST LUMPECTOMY WITH EXCISION OF SENTINEL NODE;  Surgeon: Pedro Earls, MD;  Location: [facility];  Service: General;  Laterality: Right;  right sentinel node mapping,right sentinel node biopsy, needle localization right breast lumpectomy  . Incision and drainage breast abscess      right breast seroma  . Incision and drainage abscess N/A 08/12/2013    Procedure: INCISION AND DRAINAGE ABSCESS;  Surgeon: Earnstine Regal, MD;  Location: [facility];  Service: General;  Laterality: N/A;  . Irrigation and debridement abscess N/A 08/14/2013    Procedure: Debridment of perineal tissue and debridement of perineal wound;   Surgeon: Earnstine Regal, MD;  Location: [facility];  Service: General;  Laterality: N/A;  . Irrigation and debridement abscess N/A 08/16/2013    Procedure: DRESSING CHANGE AND DEBRIDEMENT OF PERINEUM WOUND;  Surgeon: Earnstine Regal, MD;  Location: [facility];  Service: General;  Laterality: N/A;  .  Pilonidal cyst drainage N/A 08/19/2013    Procedure: Dressing change perineum;  Surgeon: Ralene Ok, MD;  Location: [facility];  Service: General;  Laterality: N/A;  . Minor application of wound vac N/A 08/20/2013    Procedure: MINOR APPLICATION OF WOUND VAC;  Surgeon: Ralene Ok, MD;  Location: [facility];  Service: General;  Laterality: N/A;  . Incision and drainage abscess N/A 08/20/2013    Procedure: INCISION AND DRAINAGE ABSCESS;  Surgeon: Ralene Ok, MD;  Location: [facility];  Service: General;  Laterality: N/A;    Mikey College MS, [place], [place] Pager [phone] After Hours Pager

## 2013-10-12 DIAGNOSIS — A0472 Enterocolitis due to Clostridium difficile, not specified as recurrent: Secondary | ICD-10-CM

## 2013-10-12 LAB — BASIC METABOLIC PANEL
BUN: 33 mg/dL — ABNORMAL HIGH (ref 6–23)
CO2: 29 mEq/L (ref 19–32)
Calcium: 9.2 mg/dL (ref 8.4–10.5)
Chloride: 103 mEq/L (ref 96–112)
Creatinine, Ser: 1.79 mg/dL — ABNORMAL HIGH (ref 0.50–1.10)
GFR calc Af Amer: 29 mL/min — ABNORMAL LOW (ref >90)
GFR calc non Af Amer: 25 mL/min — ABNORMAL LOW (ref >90)
Glucose, Bld: 139 mg/dL — ABNORMAL HIGH (ref 70–99)
Potassium: 5 mEq/L (ref 3.7–5.3)
Sodium: 143 mEq/L (ref 137–147)

## 2013-10-12 LAB — CBC
HCT: 39.1 % (ref 36.0–46.0)
Hemoglobin: 12.1 g/dL (ref 12.0–15.0)
MCH: 28 pg (ref 26.0–34.0)
MCHC: 30.9 g/dL (ref 30.0–36.0)
MCV: 90.5 fL (ref 78.0–100.0)
Platelets: 218 10*3/uL (ref 150–400)
RBC: 4.32 MIL/uL (ref 3.87–5.11)
RDW: 15.8 % — ABNORMAL HIGH (ref 11.5–15.5)
WBC: 10.7 10*3/uL — ABNORMAL HIGH (ref 4.0–10.5)

## 2013-10-12 LAB — GLUCOSE, CAPILLARY
Glucose-Capillary: 132 mg/dL — ABNORMAL HIGH (ref 70–99)
Glucose-Capillary: 165 mg/dL — ABNORMAL HIGH (ref 70–99)
Glucose-Capillary: 142 mg/dL — ABNORMAL HIGH (ref 70–99)
Glucose-Capillary: 169 mg/dL — ABNORMAL HIGH (ref 70–99)

## 2013-10-12 MED ORDER — SODIUM CHLORIDE 0.9 % IV SOLN
INTRAVENOUS | Status: DC
  Administered 2013-10-12: 14:00:00 via INTRAVENOUS

## 2013-10-12 MED ORDER — PANTOPRAZOLE SODIUM 40 MG PO TBEC
40.0000 mg | DELAYED_RELEASE_TABLET | Freq: Two times a day (BID) | ORAL | Status: DC
  Administered 2013-10-12 – 2013-10-14 (×4): 40 mg via ORAL
  Filled 2013-10-12 (×4): qty 1

## 2013-10-12 NOTE — Progress Notes (Signed)
PROGRESS NOTE  [name] [id number] DOB: 1929-07-20 DOA: 10/10/2013 PCP: [name], MD  Assessment/Plan: Rectal bleeding  -02/09/2011 colonoscopy revealed moderate diverticulosis, internal and external hemorrhoids  -No further bleeding, hemoglobin stable -? Hemorrhoidal bleed  -10/10/2013 CT abdomen pelvis negative for abscess or diverticulitis  -Consult [facility]  -Mild drop in hemoglobin last night likely represents dilution from IVF  -hold ASA  Clostridium difficile colitis -Flagyl started in 10/11/2013 Acute on chronic renal failure CKD stage III  -Likely due to volume depletion from diarrhea and furosemide -Start IV fluids -Baseline creatinine 1.0-1.3  -Discontinue furosemide  Diabetes mellitus type 2  -Hemoglobin A1c 7.7--08/12/2013  -NovoLog sliding scale  Hypertension  -Hold lisinopril as blood pressure is soft  Chronic diastolic CHF  -123XX123 echo--EF 0000000, grade 2 diastolic dysfunction  History of necrotizing fasciitis--perineum  -Consulted surgery--appreciate recommendations -continue with wet-to-dry dressing -Presently, wound has good granulation  -Maintain an indwelling Foley catheter  Hyperlipidemia  -Continue statin  Breast cancer history  -Continue Arimidex  Family Communication: husband updated at beside  Disposition Plan: Home when medically stable           Procedures/Studies: Ct Abdomen Pelvis W Contrast  10/10/2013   CLINICAL DATA:  Perineal wound.  Bloody bowel movements.  EXAM: CT ABDOMEN AND PELVIS WITH CONTRAST  TECHNIQUE: Multidetector CT imaging of the abdomen and pelvis was performed using the standard protocol following bolus administration of intravenous contrast.  CONTRAST:  140mL OMNIPAQUE IOHEXOL 300 MG/ML  SOLN  COMPARISON:  DG ABD PORTABLE 1V dated 08/14/2013; CT ABD/PELVIS W CM dated 12/11/2010; CT UROGRAM dated 01/19/2009  FINDINGS: No focal hepatic abnormality. Calcified splenic granulomas. Tiny calcified 9  mm splenic artery aneurysm. Pancreas normal. Surgical clips gallbladder fossa. Air in the biliary system. This may be from prior biliary intervention.  Adrenals normal. No focal renal abnormalities identified. Foley catheter in the bladder. Bladder is nondistended. No free pelvic fluid. Hysterectomy. Surgical clips within pelvis. Mild peroneal edema noted. No perineal mass lesion or abscess is identified.  No significant adenopathy. Aorta normal caliber and tortuous. Visceral vessels are patent.  What appears to be appendix is normal. No inflammatory changes noted in the right or left lower quadrant. Scattered diverticuli present. No evidence of diverticulitis. No evidence of bowel obstruction or free air. No mesenteric mass.  Heart size normal. Coronary artery disease. Mild basilar atelectasis. Abdominal wall intact. No significant hernia. Prior right hip replacement. Severe degenerative changes lumbar spine.  IMPRESSION: 1. Mild perineal edema, no focal peroneal mass or evidence of abscess. 2. Cholecystectomy. Air in the biliary system may be from prior biliary intervention. 3. Diverticulosis. 4. Coronary artery disease.   Electronically Signed   By: Marcello Moores  Register   On: 10/10/2013 18:17         Subjective: Patient states for the blood in her stool. She has had for her loose stools. Denies any fevers, chills, chest pain, shortness breath, nausea, vomiting, abdominal pain, dysuria. Denies any headache or dizziness.  Objective: Filed Vitals:   10/12/13 0228 10/12/13 0638 10/12/13 1048 10/12/13 1353  BP: 124/66 107/55 107/53 138/45  Pulse: 80 82 87 87  Temp: 98.6 F (37 C) 98.5 F (36.9 C) 98.5 F (36.9 C) 98.4 F (36.9 C)  TempSrc: Oral Oral Oral Oral  Resp: 18 20 20 20   Height:      Weight:      SpO2: 95% 94% 95% 93%    Intake/Output Summary (  Last 24 hours) at 10/12/13 1847 Last data filed at 10/12/13 1738  Gross per 24 hour  Intake 1228.75 ml  Output    400 ml  Net 828.75 ml    Weight change:  Exam:   General:  Pt is alert, follows commands appropriately, not in acute distress  HEENT: No icterus, No thrush, [place]/AT  Cardiovascular: RRR, S1/S2, no rubs, no gallops  Respiratory: CTA bilaterally, no wheezing, no crackles, no rhonchi  Abdomen: Soft/+BS, non tender, non distended, no guarding  Extremities: trace LE edema, No lymphangitis, No petechiae, No rashes, no synovitis  Data Reviewed: Basic Metabolic Panel:  Recent Labs Lab 10/10/13 1449 10/11/13 0456 10/12/13 0545  NA 142 141 143  K 4.3 3.8 5.0  CL 101 102 103  CO2 26 24 29   GLUCOSE [id number]* [id number]* 139*  BUN 33* 30* 33*  CREATININE 1.31* 1.27* 1.79*  CALCIUM 9.8 8.9 9.2   Liver Function Tests:  Recent Labs Lab 10/10/13 1449  AST 20  ALT 14  ALKPHOS 83  BILITOT 0.6  PROT 6.7  ALBUMIN 2.8*   No results found for this basename: LIPASE, AMYLASE,  in the last 168 hours No results found for this basename: AMMONIA,  in the last 168 hours CBC:  Recent Labs Lab 10/10/13 1449 10/10/13 2203 10/11/13 0456 10/11/13 1313 10/11/13 2118 10/12/13 0545  WBC 10.5  --  7.5  --   --  10.7*  HGB 12.9 11.0* 11.4* 12.2 12.0 12.1  HCT 42.1 36.5 36.3 38.9 38.3 39.1  MCV 90.1  --  89.0  --   --  90.5  PLT 242  --  187  --   --  218   Cardiac Enzymes: No results found for this basename: CKTOTAL, CKMB, CKMBINDEX, TROPONINI,  in the last 168 hours BNP: No components found with this basename: POCBNP,  CBG:  Recent Labs Lab 10/11/13 2004 10/11/13 2204 10/12/13 0737 10/12/13 1140 10/12/13 1713  GLUCAP 154* 174* 132* 165* 142*    Recent Results (from the past 240 hour(s))  CLOSTRIDIUM DIFFICILE BY PCR     Status: Abnormal   Collection Time    10/11/13  9:24 AM      Result Value Ref Range Status   C difficile by pcr POSITIVE (*) NEGATIVE Final   Comment: CRITICAL RESULT CALLED TO, READ BACK BY AND VERIFIED WITH:     Q. CORE RN 12:45 10/11/13 ([email or web address])     Performed at [facility]      Scheduled Meds: . allopurinol  100 mg Oral QODAY  . allopurinol  200 mg Oral QODAY  . anastrozole  1 mg Oral q morning - 10a  . feeding supplement (ENSURE COMPLETE)  237 mL Oral TID BM  . hydrocortisone  25 mg Rectal Daily  . insulin aspart  0-5 Units Subcutaneous QHS  . insulin aspart  0-9 Units Subcutaneous TID WC  . metroNIDAZOLE  500 mg Oral 3 times per day  . pantoprazole (PROTONIX) IV  40 mg Intravenous Q12H   Continuous Infusions: . sodium chloride 75 mL/hr at 10/12/13 1403     Orson Eva, DO  [facility] Pager [phone]  If 7PM-7AM, please contact night-coverage [email or web address] Password [facility] 10/12/2013, 6:47 PM   LOS: 2 days

## 2013-10-12 NOTE — Progress Notes (Signed)
Cross cover [id number] Subjective: Patient claims her rectal bleeding has improved with the suppositories. She denies having any abdominal pain, nausea or vomiting. Some hematochezia with BM's that can be hard at time. Has been following with [facility] for necrotizing fascitis of the groin.   Objective: Vital signs in last 24 hours: Temp:  [98.1 F (36.7 C)-98.6 F (37 C)] 98.5 F (36.9 C) (05/02 [id number]) Pulse Rate:  [80-95] 82 (05/02 0638) Resp:  [17-20] 20 (05/02 [id number]) BP: (107-141)/(55-73) 107/55 mmHg (05/02 0638) SpO2:  [93 %-100 %] 94 % (05/02 [id number]) Last BM Date: 10/12/13  Intake/Output from previous day: 05/01 0701 - 05/02 0700 In: 421.3 [P.O.:360; I.V.:61.3] Out: 1350 [Urine:1350] Intake/Output this shift: Total I/O In: 120 [P.O.:120] Out: -   General appearance: alert, cooperative, appears stated age, no distress and mildly obese Resp: clear to auscultation bilaterally Cardio: regular rate and rhythm, S1, S2 normal, no murmur, click, rub or gallop GI: soft, non-tender; bowel sounds normal; no masses,  no organomegaly Extremities: extremities normal, atraumatic, no cyanosis or edema  Lab Results:  Recent Labs  10/10/13 1449  10/11/13 0456 10/11/13 1313 10/11/13 2118 10/12/13 0545  WBC 10.5  --  7.5  --   --  10.7*  HGB 12.9  < > 11.4* 12.2 12.0 12.1  HCT 42.1  < > 36.3 38.9 38.3 39.1  PLT 242  --  187  --   --  218  < > = values in this interval not displayed. BMET  Recent Labs  10/10/13 1449 10/11/13 0456 10/12/13 0545  NA 142 141 143  K 4.3 3.8 5.0  CL 101 102 103  CO2 26 24 29   GLUCOSE 146* 110* 139*  BUN 33* 30* 33*  CREATININE 1.31* 1.27* 1.79*  CALCIUM 9.8 8.9 9.2   LFT  Recent Labs  10/10/13 1449  PROT 6.7  ALBUMIN 2.8*  AST 20  ALT 14  ALKPHOS 83  BILITOT 0.6   PT/INR  Recent Labs  10/10/13 1444  LABPROT 13.3  INR 1.03   Studies/Results: Ct Abdomen Pelvis W Contrast  10/10/2013   CLINICAL DATA:  Perineal wound.  Bloody bowel  movements.  EXAM: CT ABDOMEN AND PELVIS WITH CONTRAST  TECHNIQUE: Multidetector CT imaging of the abdomen and pelvis was performed using the standard protocol following bolus administration of intravenous contrast.  CONTRAST:  141mL OMNIPAQUE IOHEXOL 300 MG/ML  SOLN  COMPARISON:  DG ABD PORTABLE 1V dated 08/14/2013; CT ABD/PELVIS W CM dated 12/11/2010; CT UROGRAM dated 01/19/2009  FINDINGS: No focal hepatic abnormality. Calcified splenic granulomas. 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 Pancreas normal. Surgical clips gallbladder fossa. Air in the biliary system. This may be from prior biliary intervention.  Adrenals normal. No focal renal abnormalities identified. Foley catheter in the bladder. Bladder is nondistended. No free pelvic fluid. Hysterectomy. Surgical clips within pelvis. Mild peroneal edema noted. No perineal mass lesion or abscess is identified.  No significant adenopathy. Aorta normal caliber and tortuous. Visceral vessels are patent.  What appears to be appendix is normal. No inflammatory changes noted in the right or left lower quadrant. Scattered diverticuli present. No evidence of diverticulitis. No evidence of bowel obstruction or free air. No mesenteric mass.  Heart size normal. Coronary artery disease. Mild basilar atelectasis. Abdominal wall intact. No significant hernia. Prior right hip replacement. Severe degenerative changes lumbar spine.  IMPRESSION: 1. Mild perineal edema, no focal peroneal mass or evidence of abscess. 2. Cholecystectomy. Air in the biliary system may be from prior  biliary intervention. 3. Diverticulosis. 4. Coronary artery disease.   Electronically Signed   By: Marcello Moores  Register   On: 10/10/2013 18:17   Medications: I have reviewed the patient's current medications.  Assessment/Plan: 1) Rectal bleeding assumed to be from hemorrhoids; doing well on suppositories.  2) Diverticulosis.  3) Necrotizing fascitis-groin: improving slowly. 4) Protein calorie  malnutrition.   LOS: 2 days   Juanita Craver 10/12/2013, 9:21 AM

## 2013-10-13 LAB — CBC
HCT: 36.6 % (ref 36.0–46.0)
Hemoglobin: 11.4 g/dL — ABNORMAL LOW (ref 12.0–15.0)
MCH: 27.6 pg (ref 26.0–34.0)
MCHC: 31.1 g/dL (ref 30.0–36.0)
MCV: 88.6 fL (ref 78.0–100.0)
Platelets: 193 10*3/uL (ref 150–400)
RBC: 4.13 MIL/uL (ref 3.87–5.11)
RDW: 15.6 % — ABNORMAL HIGH (ref 11.5–15.5)
WBC: 6.5 10*3/uL (ref 4.0–10.5)

## 2013-10-13 LAB — BASIC METABOLIC PANEL
BUN: 32 mg/dL — ABNORMAL HIGH (ref 6–23)
CO2: 26 mEq/L (ref 19–32)
Calcium: 8.8 mg/dL (ref 8.4–10.5)
Chloride: 102 mEq/L (ref 96–112)
Creatinine, Ser: 1.69 mg/dL — ABNORMAL HIGH (ref 0.50–1.10)
GFR calc Af Amer: 32 mL/min — ABNORMAL LOW (ref >90)
GFR calc non Af Amer: 27 mL/min — ABNORMAL LOW (ref >90)
Glucose, Bld: 118 mg/dL — ABNORMAL HIGH (ref 70–99)
Potassium: 4 mEq/L (ref 3.7–5.3)
Sodium: 142 mEq/L (ref 137–147)

## 2013-10-13 LAB — GLUCOSE, CAPILLARY
Glucose-Capillary: 116 mg/dL — ABNORMAL HIGH (ref 70–99)
Glucose-Capillary: 205 mg/dL — ABNORMAL HIGH (ref 70–99)
Glucose-Capillary: 135 mg/dL — ABNORMAL HIGH (ref 70–99)
Glucose-Capillary: 178 mg/dL — ABNORMAL HIGH (ref 70–99)

## 2013-10-13 NOTE — Progress Notes (Signed)
Unable to start IV after two attempts.  Have called IV team and they will come this morning and restart IV.  It can still infusing but site is red and needs changing.  Left in for emergency use only.  No running at this time, pt encouraged to take fluids and she is tolerating them well

## 2013-10-13 NOTE — Discharge Summary (Addendum)
Physician Discharge Summary  [name] [id number] DOB: [birth year] DOA: 10/10/2013  PCP: [name], MD  Admit date: 10/10/2013 Discharge date: 10/14/13 Recommendations for Outpatient Follow-up:  1. Pt will need to follow up with PCP in 2 weeks post discharge 2. Please obtain BMP to evaluate electrolytes and kidney function 3. Please also check CBC to evaluate Hg and Hct levels   Discharge Diagnoses:  Rectal bleeding  -02/09/2011 colonoscopy revealed moderate diverticulosis, internal and external hemorrhoids  -No further bleeding, hemoglobin stable  -? Hemorrhoidal bleed  -10/10/2013 CT abdomen pelvis negative for abscess or diverticulitis  -Consulted [facility]--started Anusol  -Mild drop in hemoglobin last night likely represents dilution from IVF  -hold ASA  Clostridium difficile colitis  -Flagyl started in 10/11/2013  -WBC improving--6.5 on day of d/c Acute on chronic renal failure CKD stage III  -Likely due to volume depletion from diarrhea and furosemide  -Start IV fluids during hospitaliation -Baseline creatinine 1.0-1.3  -furosemide--instructed patient to restart on 10/16/13 -IVF not working properly last night (10/12/13)-->did not get much IVF  -serum creatinine on day of d/c--1.42 Diabetes mellitus type 2  -Hemoglobin A1c 7.7--08/12/2013  -NovoLog sliding scale  Hypertension  -D/C lisinopril altogether due to renal failure -Blood pressure remains stable off of anti-HTN meds -f/u PCP for further monitoring Chronic diastolic CHF  -123XX123 echo--EF 0000000, grade 2 diastolic dysfunction  -compensated History of necrotizing fasciitis--perineum  -Consulted surgery--appreciate recommendations  -continue with wet-to-dry dressing  -Presently, wound has good granulation  -Maintain an indwelling Foley catheter  Hyperlipidemia  -Continue statin  Breast cancer history  -Continue Arimidex  Hx Necrotizing Fasciitis-perineum -surgery input appreciated -Continue  twice daily dressing changes wet to dry with coarse mesh gauze and NS  Shower or Sitz bath daily   Discharge Condition: Stable  Disposition:  Follow-up Information   Follow up with Earnstine Regal, MD In 2 weeks.   Specialty:  General Surgery   Contact information:   [place] [address] [phone]      home  Diet: Soft Wt Readings from Last 3 Encounters:  10/10/13 103.874 kg (229 lb)  10/10/13 104.146 kg (229 lb 9.6 oz)  10/03/13 104.101 kg (229 lb 8 oz)    History of present illness:  80 y.o. female with Multiple Medical problems and Recent Necrotizing Fasciitis of the perineum in 08/2013 with I+D x 3 on continued wound care who present ed to her PCP today with complaints of rectal bleeding x 1 week. She reports passing BRBPR, and passing large clots at times. She denies having associated ABD or pelvic pain and denies having any Nausea or Vomiting. She has had progressive weakness over the past week. The patient has also noted increasing loose stools. She was on antibiotics the lower one month ago for her Fournier's gangrene. Gastroenterology was consulted. They felt that the bleeding may have been hemorrhoidal in nature. No intervention was planned. The patient's bleeding spontaneously resolved. Her hemoglobin remained stable. Clostridium difficile PCR was obtained and it was positive. The patient was started on Flagyl. Her WBCs improved. Unfortunately, the patient developed acute kidney injury most likely due to volume depletion from her diarrhea and furosemide. Furosemide was discontinued. The patient was started on IV fluids. Her serum creatinine gradually improved. The patient was discharged in stable condition. Her bowel movements became more formed.     Consultants: General surgery GI- [place] Discharge Exam: Filed Vitals:   10/14/13 0521  BP: 135/58  Pulse: 96  Temp:  98.6 F (37 C)  Resp: 18   Filed Vitals:   10/13/13 1823 10/13/13 2157  10/14/13 0207 10/14/13 0521  BP: 126/50 120/46 118/40 135/58  Pulse: 77 82 81 96  Temp: 98.4 F (36.9 C) 98.4 F (36.9 C) 98.6 F (37 C) 98.6 F (37 C)  TempSrc: Oral Oral Oral Oral  Resp: 18 18 18 18   Height:      Weight:      SpO2: 93% 92% 90% 90%   General: A&O x 3, NAD, pleasant, cooperative Cardiovascular: RRR, no rub, no gallop, no S3 Respiratory: CTAB, no wheeze, no rhonchi Abdomen:soft, nontender, nondistended, positive bowel sounds Extremities: trace LE edema, No lymphangitis, no petechiae  Discharge Instructions      Discharge Orders   Future Appointments Provider Department Dept Phone   11/05/2013 3:00 PM Earnstine Regal, [facility] [place]   01/02/2014 2:30 PM Biagio Borg, MD [place] [phone]   02/18/2014 2:45 PM [facility] [phone]   02/18/2014 3:15 PM [name], PA-C [facility] [phone]   Future Orders Complete By Expires   Diet - low sodium heart healthy  As directed    Diet - low sodium heart healthy  As directed    Discharge instructions  As directed    Increase activity slowly  As directed    Increase activity slowly  As directed        Medication List    STOP taking these medications       lisinopril 40 MG tablet  Commonly known as:  PRINIVIL,ZESTRIL      TAKE these medications       allopurinol 100 MG tablet  Commonly known as:  ZYLOPRIM  Take 100-200 mg by mouth every other day. Take 100 mg and then 200 mg on alternating days.     anastrozole 1 MG tablet  Commonly known as:  ARIMIDEX  Take 1 mg by mouth every morning.     aspirin EC 81 MG tablet  Take 243 mg by mouth every other day.     aspirin 325 MG EC tablet  Take 325 mg by mouth every other day.     cholecalciferol 1000 UNITS tablet  Commonly known as:  VITAMIN D  Take 1,000 Units by mouth every morning.     famotidine 20 MG tablet    Commonly known as:  PEPCID  Take 1 tablet (20 mg total) by mouth daily.     furosemide 40 MG tablet  Commonly known as:  LASIX  Take 1 tablet (40 mg total) by mouth every morning. Start 10/16/13     hydrocortisone 25 MG suppository  Commonly known as:  ANUSOL-HC  Place 1 suppository (25 mg total) rectally daily.     metroNIDAZOLE 500 MG tablet  Commonly known as:  FLAGYL  Take 1 tablet (500 mg total) by mouth every 8 (eight) hours.     polyvinyl alcohol 1.4 % ophthalmic solution  Commonly known as:  LIQUIFILM TEARS  Place 2 drops into both eyes daily as needed for dry eyes.     saccharomyces boulardii 250 MG capsule  Commonly known as:  FLORASTOR  Take 250 mg by mouth daily.         The results of significant diagnostics from this hospitalization (including imaging, microbiology, ancillary and laboratory) are listed below for reference.    Significant Diagnostic Studies: Ct Abdomen Pelvis  W Contrast  10/10/2013   CLINICAL DATA:  Perineal wound.  Bloody bowel movements.  EXAM: CT ABDOMEN AND PELVIS WITH CONTRAST  TECHNIQUE: Multidetector CT imaging of the abdomen and pelvis was performed using the standard protocol following bolus administration of intravenous contrast.  CONTRAST:  150mL OMNIPAQUE IOHEXOL 300 MG/ML  SOLN  COMPARISON:  DG ABD PORTABLE 1V dated 08/14/2013; CT ABD/PELVIS W CM dated 12/11/2010; CT UROGRAM dated 01/19/2009  FINDINGS: No focal hepatic abnormality. Calcified splenic granulomas. 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 Pancreas normal. Surgical clips gallbladder fossa. Air in the biliary system. This may be from prior biliary intervention.  Adrenals normal. No focal renal abnormalities identified. Foley catheter in the bladder. Bladder is nondistended. No free pelvic fluid. Hysterectomy. Surgical clips within pelvis. Mild peroneal edema noted. No perineal mass lesion or abscess is identified.  No significant adenopathy. Aorta normal caliber and tortuous.  Visceral vessels are patent.  What appears to be appendix is normal. No inflammatory changes noted in the right or left lower quadrant. Scattered diverticuli present. No evidence of diverticulitis. No evidence of bowel obstruction or free air. No mesenteric mass.  Heart size normal. Coronary artery disease. Mild basilar atelectasis. Abdominal wall intact. No significant hernia. Prior right hip replacement. Severe degenerative changes lumbar spine.  IMPRESSION: 1. Mild perineal edema, no focal peroneal mass or evidence of abscess. 2. Cholecystectomy. Air in the biliary system may be from prior biliary intervention. 3. Diverticulosis. 4. Coronary artery disease.   Electronically Signed   By: Marcello Moores  Register   On: 10/10/2013 18:17     Microbiology: Recent Results (from the past 240 hour(s))  CLOSTRIDIUM DIFFICILE BY PCR     Status: Abnormal   Collection Time    10/11/13  9:24 AM      Result Value Ref Range Status   C difficile by pcr POSITIVE (*) NEGATIVE Final   Comment: CRITICAL RESULT CALLED TO, READ BACK BY AND VERIFIED WITH:     Q. CORE RN 12:45 10/11/13 ([email or web address])     Performed at [facility]: Basic Metabolic Panel:  Recent Labs Lab 10/10/13 1449 10/11/13 0456 10/12/13 0545 10/13/13 0545 10/14/13 0503  NA 142 141 143 142 141  K 4.3 3.8 5.0 4.0 4.2  CL 101 102 103 102 105  CO2 26 24 29 26 26   GLUCOSE 146* 110* 139* 118* 108*  BUN 33* 30* 33* 32* 23  CREATININE 1.31* 1.27* 1.79* 1.69* 1.42*  CALCIUM 9.8 8.9 9.2 8.8 8.5   Liver Function Tests:  Recent Labs Lab 10/10/13 1449  AST 20  ALT 14  ALKPHOS 83  BILITOT 0.6  PROT 6.7  ALBUMIN 2.8*   No results found for this basename: LIPASE, AMYLASE,  in the last 168 hours No results found for this basename: AMMONIA,  in the last 168 hours CBC:  Recent Labs Lab 10/10/13 1449  10/11/13 0456 10/11/13 1313 10/11/13 2118 10/12/13 0545 10/13/13 0545 10/14/13 0503  WBC 10.5  --  7.5  --   --  10.7* 6.5 6.5    HGB 12.9  < > 11.4* 12.2 12.0 12.1 11.4* 10.4*  HCT 42.1  < > 36.3 38.9 38.3 39.1 36.6 34.6*  MCV 90.1  --  89.0  --   --  90.5 88.6 90.1  PLT 242  --  187  --   --  218 193 181  < > = values in this interval not displayed. Cardiac Enzymes: No results found  for this basename: CKTOTAL, CKMB, CKMBINDEX, TROPONINI,  in the last 168 hours BNP: No components found with this basename: POCBNP,  CBG:  Recent Labs Lab 10/13/13 0729 10/13/13 1146 10/13/13 1717 10/13/13 2154 10/14/13 0807  GLUCAP 116* 205* 135* 178* 108*    Time coordinating discharge:  Greater than 30 minutes  Signed:  Orson Eva, DO [facility] Pager: [phone] 10/14/2013, 9:20 AM

## 2013-10-13 NOTE — Progress Notes (Signed)
PROGRESS NOTE  [name] [id number] DOB: Feb 09, 1930 DOA: 10/10/2013 PCP: [name], MD  Assessment/Plan:  Rectal bleeding  -02/09/2011 colonoscopy revealed moderate diverticulosis, internal and external hemorrhoids  -No further bleeding, hemoglobin stable  -? Hemorrhoidal bleed  -10/10/2013 CT abdomen pelvis negative for abscess or diverticulitis  -Consulted [facility]--started Anusol  -Mild drop in hemoglobin last night likely represents dilution from IVF  -hold ASA  Clostridium difficile colitis  -Flagyl started in 10/11/2013  Acute on chronic renal failure CKD stage III  -Likely due to volume depletion from diarrhea and furosemide  -Start IV fluids  -Baseline creatinine 1.0-1.3  -Discontinue furosemide  -IVF not working properly last night-->did not get much IVF Diabetes mellitus type 2  -Hemoglobin A1c 7.7--08/12/2013  -NovoLog sliding scale  Hypertension  -Hold lisinopril -Blood pressure remains stable Chronic diastolic CHF  -123XX123 echo--EF 0000000, grade 2 diastolic dysfunction  History of necrotizing fasciitis--perineum  -Consulted surgery--appreciate recommendations  -continue with wet-to-dry dressing  -Presently, wound has good granulation  -Maintain an indwelling Foley catheter  Hyperlipidemia  -Continue statin  Breast cancer history  -Continue Arimidex  Family Communication: husband updated at beside  Disposition Plan: Home when medically stable         Procedures/Studies: Ct Abdomen Pelvis W Contrast  10/10/2013   CLINICAL DATA:  Perineal wound.  Bloody bowel movements.  EXAM: CT ABDOMEN AND PELVIS WITH CONTRAST  TECHNIQUE: Multidetector CT imaging of the abdomen and pelvis was performed using the standard protocol following bolus administration of intravenous contrast.  CONTRAST:  157mL OMNIPAQUE IOHEXOL 300 MG/ML  SOLN  COMPARISON:  DG ABD PORTABLE 1V dated 08/14/2013; CT ABD/PELVIS W CM dated 12/11/2010; CT UROGRAM dated 01/19/2009   FINDINGS: No focal hepatic abnormality. Calcified splenic granulomas. 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 Pancreas normal. Surgical clips gallbladder fossa. Air in the biliary system. This may be from prior biliary intervention.  Adrenals normal. No focal renal abnormalities identified. Foley catheter in the bladder. Bladder is nondistended. No free pelvic fluid. Hysterectomy. Surgical clips within pelvis. Mild peroneal edema noted. No perineal mass lesion or abscess is identified.  No significant adenopathy. Aorta normal caliber and tortuous. Visceral vessels are patent.  What appears to be appendix is normal. No inflammatory changes noted in the right or left lower quadrant. Scattered diverticuli present. No evidence of diverticulitis. No evidence of bowel obstruction or free air. No mesenteric mass.  Heart size normal. Coronary artery disease. Mild basilar atelectasis. Abdominal wall intact. No significant hernia. Prior right hip replacement. Severe degenerative changes lumbar spine.  IMPRESSION: 1. Mild perineal edema, no focal peroneal mass or evidence of abscess. 2. Cholecystectomy. Air in the biliary system may be from prior biliary intervention. 3. Diverticulosis. 4. Coronary artery disease.   Electronically Signed   By: Marcello Moores  Register   On: 10/10/2013 18:17         Subjective: Patient still has a number of loose stools, but states that her stool now is getting more formed. Denies any chest discomfort, shortness breath, vomiting, diarrhea, abdominal pain, dysuria, headache. She has had some nausea  Objective: Filed Vitals:   10/12/13 2209 10/13/13 0212 10/13/13 0611 10/13/13 1034  BP: 133/46 126/44 109/44 130/43  Pulse: 93 82 76 93  Temp: 98 F (36.7 C) 98.2 F (36.8 C) 97.8 F (36.6 C) 98.2 F (36.8 C)  TempSrc: Oral Oral Oral   Resp: 18 18 18 18   Height:  Weight:      SpO2: 93% 91% 92% 95%    Intake/Output Summary (Last 24 hours) at 10/13/13 1500 Last data filed  at 10/13/13 0813  Gross per 24 hour  Intake 1472.5 ml  Output    550 ml  Net  922.5 ml   Weight change:  Exam:   General:  Pt is alert, follows commands appropriately, not in acute distress  HEENT: No icterus, No thrush,  Cloud/AT  Cardiovascular: RRR, S1/S2, no rubs, no gallops  Respiratory: CTA bilaterally, no wheezing, no crackles, no rhonchi  Abdomen: Soft/+BS, non tender, non distended, no guarding  Extremities: trace LE edema, No lymphangitis, No petechiae, No rashes, no synovitis  Data Reviewed: Basic Metabolic Panel:  Recent Labs Lab 10/10/13 1449 10/11/13 0456 10/12/13 0545 10/13/13 0545  NA 142 141 143 142  K 4.3 3.8 5.0 4.0  CL 101 102 103 102  CO2 26 24 29 26   GLUCOSE 146* 110* 139* 118*  BUN 33* 30* 33* 32*  CREATININE 1.31* 1.27* 1.79* 1.69*  CALCIUM 9.8 8.9 9.2 8.8   Liver Function Tests:  Recent Labs Lab 10/10/13 1449  AST 20  ALT 14  ALKPHOS 83  BILITOT 0.6  PROT 6.7  ALBUMIN 2.8*   No results found for this basename: LIPASE, AMYLASE,  in the last 168 hours No results found for this basename: AMMONIA,  in the last 168 hours CBC:  Recent Labs Lab 10/10/13 1449  10/11/13 0456 10/11/13 1313 10/11/13 2118 10/12/13 0545 10/13/13 0545  WBC 10.5  --  7.5  --   --  10.7* 6.5  HGB 12.9  < > 11.4* 12.2 12.0 12.1 11.4*  HCT 42.1  < > 36.3 38.9 38.3 39.1 36.6  MCV 90.1  --  89.0  --   --  90.5 88.6  PLT 242  --  187  --   --  218 193  < > = values in this interval not displayed. Cardiac Enzymes: No results found for this basename: CKTOTAL, CKMB, CKMBINDEX, TROPONINI,  in the last 168 hours BNP: No components found with this basename: POCBNP,  CBG:  Recent Labs Lab 10/12/13 1140 10/12/13 1713 10/12/13 2111 10/13/13 0729 10/13/13 1146  GLUCAP 165* 142* 169* 116* 205*    Recent Results (from the past 240 hour(s))  CLOSTRIDIUM DIFFICILE BY PCR     Status: Abnormal   Collection Time    10/11/13  9:24 AM      Result Value Ref Range  Status   C difficile by pcr POSITIVE (*) NEGATIVE Final   Comment: CRITICAL RESULT CALLED TO, READ BACK BY AND VERIFIED WITH:     Q. CORE RN 12:45 10/11/13 ([email or web address])     Performed at [facility]     Scheduled Meds: . allopurinol  100 mg Oral QODAY  . allopurinol  200 mg Oral QODAY  . anastrozole  1 mg Oral q morning - 10a  . feeding supplement (ENSURE COMPLETE)  237 mL Oral TID BM  . hydrocortisone  25 mg Rectal Daily  . insulin aspart  0-5 Units Subcutaneous QHS  . insulin aspart  0-9 Units Subcutaneous TID WC  . metroNIDAZOLE  500 mg Oral 3 times per day  . pantoprazole  40 mg Oral BID   Continuous Infusions: . sodium chloride 75 mL/hr at 10/13/13 [id number]     Orson Eva, DO  [facility] Pager [phone]  If 7PM-7AM, please contact night-coverage [email or web address] Password [email or web address] 10/13/2013, 3:00 PM   LOS:  3 days

## 2013-10-14 LAB — BASIC METABOLIC PANEL
BUN: 23 mg/dL (ref 6–23)
CO2: 26 mEq/L (ref 19–32)
Calcium: 8.5 mg/dL (ref 8.4–10.5)
Chloride: 105 mEq/L (ref 96–112)
Creatinine, Ser: 1.42 mg/dL — ABNORMAL HIGH (ref 0.50–1.10)
GFR calc Af Amer: 39 mL/min — ABNORMAL LOW (ref >90)
GFR calc non Af Amer: 34 mL/min — ABNORMAL LOW (ref >90)
Glucose, Bld: 108 mg/dL — ABNORMAL HIGH (ref 70–99)
Potassium: 4.2 mEq/L (ref 3.7–5.3)
Sodium: 141 mEq/L (ref 137–147)

## 2013-10-14 LAB — CBC
HCT: 34.6 % — ABNORMAL LOW (ref 36.0–46.0)
Hemoglobin: 10.4 g/dL — ABNORMAL LOW (ref 12.0–15.0)
MCH: 27.1 pg (ref 26.0–34.0)
MCHC: 30.1 g/dL (ref 30.0–36.0)
MCV: 90.1 fL (ref 78.0–100.0)
Platelets: 181 10*3/uL (ref 150–400)
RBC: 3.84 MIL/uL — ABNORMAL LOW (ref 3.87–5.11)
RDW: 15.7 % — ABNORMAL HIGH (ref 11.5–15.5)
WBC: 6.5 10*3/uL (ref 4.0–10.5)

## 2013-10-14 LAB — GLUCOSE, CAPILLARY: Glucose-Capillary: 108 mg/dL — ABNORMAL HIGH (ref 70–99)

## 2013-10-14 MED ORDER — HYDROCORTISONE ACETATE 25 MG RE SUPP: 25.0000 mg | Freq: Every day | RECTAL | Status: DC

## 2013-10-14 MED ORDER — FUROSEMIDE 40 MG PO TABS: 40.0000 mg | ORAL_TABLET | Freq: Every morning | ORAL | Status: DC

## 2013-10-14 MED ORDER — METRONIDAZOLE 500 MG PO TABS: 500.0000 mg | ORAL_TABLET | Freq: Three times a day (TID) | ORAL | Status: DC

## 2013-10-14 MED ORDER — ZOLPIDEM TARTRATE 5 MG PO TABS
5.0000 mg | ORAL_TABLET | Freq: Once | ORAL | Status: AC
  Administered 2013-10-14: 5 mg via ORAL
  Filled 2013-10-14: qty 1

## 2013-10-14 NOTE — Progress Notes (Signed)
Pt to d/c home. AVS reviewed and "My Chart" discussed with pt. Pt capable of verbalizing medications, dressing changes, signs and symptoms of infection, and follow-up appointments. Remains hemodynamically stable. No signs and symptoms of distress. Educated pt to return to ER in the case of SOB, dizziness, or chest pain; and to call Dr if any worsening of symptoms/wounds occur.

## 2013-10-16 ENCOUNTER — Telehealth: Payer: Self-pay

## 2013-10-16 NOTE — Telephone Encounter (Signed)
The patient's home health called and is hoping to get a verbal order for a home health RN to come out (due to catheter problems)   Thanks!

## 2013-10-16 NOTE — Telephone Encounter (Signed)
Ok for verbal 

## 2013-10-16 NOTE — Telephone Encounter (Signed)
Error

## 2013-10-17 ENCOUNTER — Telehealth: Payer: Self-pay | Admitting: Internal Medicine

## 2013-10-17 NOTE — Telephone Encounter (Signed)
Informed.

## 2013-10-17 NOTE — Telephone Encounter (Signed)
Pt was in the hospital.  She wants to be referred back to home health to come to check her wounds where she was operated on.   [phone] is the number of the nurse that called her.  She didn't know the name or the home health name.

## 2013-10-17 NOTE — Telephone Encounter (Signed)
Called [facility] informed of verbal ok with home health.

## 2013-10-17 NOTE — Telephone Encounter (Signed)
[facility] [place] for verbal if ok with home health

## 2013-10-23 DIAGNOSIS — I5030 Unspecified diastolic (congestive) heart failure: Secondary | ICD-10-CM

## 2013-10-23 DIAGNOSIS — I129 Hypertensive chronic kidney disease with stage 1 through stage 4 chronic kidney disease, or unspecified chronic kidney disease: Secondary | ICD-10-CM

## 2013-10-23 DIAGNOSIS — I110 Hypertensive heart disease with heart failure: Secondary | ICD-10-CM

## 2013-10-23 DIAGNOSIS — I509 Heart failure, unspecified: Secondary | ICD-10-CM

## 2013-10-23 DIAGNOSIS — M726 Necrotizing fasciitis: Secondary | ICD-10-CM

## 2013-10-23 DIAGNOSIS — N189 Chronic kidney disease, unspecified: Secondary | ICD-10-CM

## 2013-11-05 ENCOUNTER — Ambulatory Visit (INDEPENDENT_AMBULATORY_CARE_PROVIDER_SITE_OTHER): Payer: Medicare Other | Admitting: Surgery

## 2013-11-05 ENCOUNTER — Encounter (INDEPENDENT_AMBULATORY_CARE_PROVIDER_SITE_OTHER): Payer: Self-pay | Admitting: Surgery

## 2013-11-05 VITALS — BP 128/74 | HR 95 | Temp 97.6°F | Ht 65.0 in | Wt 226.0 lb

## 2013-11-05 DIAGNOSIS — T148XXD Other injury of unspecified body region, subsequent encounter: Secondary | ICD-10-CM

## 2013-11-05 DIAGNOSIS — T07XXXA Unspecified multiple injuries, initial encounter: Secondary | ICD-10-CM

## 2013-11-05 DIAGNOSIS — M726 Necrotizing fasciitis: Secondary | ICD-10-CM

## 2013-11-05 NOTE — Progress Notes (Signed)
General Surgery [name]  Chief Complaint  Patient presents with  . Routine Post Op    Fournier's gangrene - wound check    HISTORY: Patient is an 80 year old female who underwent debridement of Fournier's gangrene in March 2015. She returns today for wound check. She is cleansing the wound daily. Wound care is assisted by home health nursing. She has an indwelling catheter with a leg bag.  PERTINENT REVIEW OF SYSTEMS: Patient has had only small intermittent rectal bleeding with bowel movements since discharge from the hospital.  EXAM: HEENT: normocephalic; pupils equal and reactive; sclerae clear; dentition good; mucous membranes moist NECK:  symmetric on extension; no palpable anterior or posterior cervical lymphadenopathy; no supraclavicular masses; no tenderness PERINEUM: Right groin wound is nearly completely granulated and measures only approximately 1 cm in width by 8 cm in length which is yet to be epithelialized. Right buttock wound measures 3 x 5 cm and is quite superficial with islands of granulation tissue remaining. There is no sign of infection.   IMPRESSION: #1 Fournier's gangrene, wound healing by secondary intention #2 history of rectal bleeding, resolved  PLAN: Patient and I discussed her wound care. I suspect the wound will be epithelialized at approximately 2-3 weeks. At that point we can remove the indwelling catheter and switch to topical cocoa butter application to the wounds.  Patient will return for wound check in our office in 6 weeks.  Earnstine Regal, MD, [facility] Office: [phone]  Visit Diagnoses: 1. Wound healing, delayed   2. Necrotizing fasciitis of perineum

## 2013-11-13 DIAGNOSIS — I129 Hypertensive chronic kidney disease with stage 1 through stage 4 chronic kidney disease, or unspecified chronic kidney disease: Secondary | ICD-10-CM

## 2013-11-13 DIAGNOSIS — I509 Heart failure, unspecified: Secondary | ICD-10-CM

## 2013-11-13 DIAGNOSIS — M726 Necrotizing fasciitis: Secondary | ICD-10-CM

## 2013-11-13 DIAGNOSIS — I110 Hypertensive heart disease with heart failure: Secondary | ICD-10-CM

## 2013-11-13 DIAGNOSIS — N189 Chronic kidney disease, unspecified: Secondary | ICD-10-CM

## 2013-11-13 DIAGNOSIS — I5030 Unspecified diastolic (congestive) heart failure: Secondary | ICD-10-CM

## 2013-11-28 ENCOUNTER — Telehealth: Payer: Self-pay

## 2013-11-28 NOTE — Telephone Encounter (Signed)
Ok for all 

## 2013-11-28 NOTE — Telephone Encounter (Signed)
HHRN saw the patient in the home today for a cath change. States burning, urinary frequency, cottage cheese discharge.  [facility] request order to check urine and an antifungal sent in for yeast.  Call back number [phone]

## 2013-11-29 MED ORDER — FLUCONAZOLE 150 MG PO TABS: ORAL_TABLET | ORAL | Status: DC

## 2013-11-29 MED ORDER — CEPHALEXIN 500 MG PO CAPS: 500.0000 mg | ORAL_CAPSULE | Freq: Four times a day (QID) | ORAL | Status: DC

## 2013-11-29 NOTE — Telephone Encounter (Signed)
Tonia Ghent [facility] at [phone] left detailed message medications sent in.

## 2013-11-29 NOTE — Telephone Encounter (Signed)
Called the patient informed prescriptions sent to [facility].  Called [facility] once again had to leave a message.

## 2013-11-29 NOTE — Telephone Encounter (Signed)
We just received urine test results.  Put on MD's desk to review.  Please do also send in something for yeast as RN states patient  Does need

## 2013-11-29 NOTE — Addendum Note (Signed)
Addended by: Biagio Borg on: 11/29/2013 01:27 PM   Modules accepted: Orders

## 2013-11-29 NOTE — Telephone Encounter (Signed)
HHRN informed of ok 

## 2013-11-29 NOTE — Telephone Encounter (Signed)
[facility] did return my call to confirm message received that antibiotic and diflucan sent in to [facility].

## 2013-11-29 NOTE — Telephone Encounter (Signed)
The patient called with concern if an antibiotic is going to be sent in today for her.  I did call HHRN back left another message to call me back.  Patient is itching and burning, does not want to go the weekend without anything.

## 2013-11-29 NOTE — Telephone Encounter (Signed)
ua received  antibx and diflucan sent to [facility]

## 2013-12-17 ENCOUNTER — Encounter (INDEPENDENT_AMBULATORY_CARE_PROVIDER_SITE_OTHER): Payer: Self-pay | Admitting: Surgery

## 2013-12-17 ENCOUNTER — Ambulatory Visit (INDEPENDENT_AMBULATORY_CARE_PROVIDER_SITE_OTHER): Payer: Medicare Other | Admitting: Surgery

## 2013-12-17 VITALS — BP 142/78 | HR 80 | Temp 98.2°F | Resp 20 | Ht 65.0 in | Wt 231.0 lb

## 2013-12-17 DIAGNOSIS — M726 Necrotizing fasciitis: Secondary | ICD-10-CM

## 2013-12-17 DIAGNOSIS — I5030 Unspecified diastolic (congestive) heart failure: Secondary | ICD-10-CM

## 2013-12-17 DIAGNOSIS — I129 Hypertensive chronic kidney disease with stage 1 through stage 4 chronic kidney disease, or unspecified chronic kidney disease: Secondary | ICD-10-CM

## 2013-12-17 DIAGNOSIS — N183 Chronic kidney disease, stage 3 unspecified: Secondary | ICD-10-CM

## 2013-12-17 DIAGNOSIS — T07XXXA Unspecified multiple injuries, initial encounter: Secondary | ICD-10-CM

## 2013-12-17 DIAGNOSIS — T148XXD Other injury of unspecified body region, subsequent encounter: Secondary | ICD-10-CM

## 2013-12-17 DIAGNOSIS — E119 Type 2 diabetes mellitus without complications: Secondary | ICD-10-CM

## 2013-12-17 NOTE — Progress Notes (Signed)
General Surgery [facility]  Chief Complaint  Patient presents with  . Routine Post Op    reck perineum wound - Fournier's gangrene    HISTORY: Patient is an 80 year old female who underwent debridement of Fournier's gangrene. She required multiple procedures and multiple dressing changes. She has been healing by secondary intention. She returns today for a final wound check.  EXAM: Wound involves the right groin and right perineum extending onto the right buttock. It is nearly completely epithelialized with no drainage and no sign of infection. Foley catheter is removed in the office today  IMPRESSION: History of Fournier gangrene, wound healing by secondary intention, now complete  PLAN: Patient will cleanse the area daily with soap and water. She will apply cocoa butter topically.  Foley catheters removed. Patient is encouraged to contact urology for further evaluation if desired. She has long-standing incontinence.  Patient will continue long-term followup with Dr. Kaylyn Lim in our practice.  Patient will return to see me as needed for surgical care.  Earnstine Regal, MD, [facility]   Visit Diagnoses: 1. Wound healing, delayed   2. Necrotizing fasciitis of perineum

## 2013-12-17 NOTE — Patient Instructions (Signed)
  CARE OF INCISION   Apply cocoa butter/vitamin E cream (Palmer's brand) to your incision 2 - 3 times daily.  Massage cream into incision for one minute with each application.  Use sunscreen (50 SPF or higher) for first 6 months after surgery if area is exposed to sun.  You may alternate Mederma or other scar reducing cream with cocoa butter cream if desired.       Berklie Dethlefs M. Stuart Guillen, MD, FACS      [facility]      Office: [phone]    

## 2013-12-19 ENCOUNTER — Other Ambulatory Visit: Payer: Self-pay | Admitting: Internal Medicine

## 2014-01-02 ENCOUNTER — Ambulatory Visit (INDEPENDENT_AMBULATORY_CARE_PROVIDER_SITE_OTHER): Payer: Medicare Other | Admitting: Internal Medicine

## 2014-01-02 ENCOUNTER — Encounter: Payer: Self-pay | Admitting: Internal Medicine

## 2014-01-02 VITALS — BP 152/84 | HR 103 | Temp 98.3°F | Wt 230.0 lb

## 2014-01-02 DIAGNOSIS — N183 Chronic kidney disease, stage 3 unspecified: Secondary | ICD-10-CM

## 2014-01-02 DIAGNOSIS — I10 Essential (primary) hypertension: Secondary | ICD-10-CM

## 2014-01-02 DIAGNOSIS — E119 Type 2 diabetes mellitus without complications: Secondary | ICD-10-CM

## 2014-01-02 NOTE — Progress Notes (Signed)
Subjective:    Patient ID: [name], female    DOB: Nov 29, 1929, 80 y.o.   MRN: [id number]  HPI  Here to f/u; overall doing ok,  Pt denies chest pain, increased sob or doe, wheezing, orthopnea, PND, increased LE swelling, palpitations, dizziness or syncope.  Pt denies polydipsia, polyuria, or low sugar symptoms such as weakness or confusion improved with po intake.  Pt denies new neurological symptoms such as new headache, or facial or extremity weakness or numbness.   Pt states overall good compliance with meds,.  BP at home with [facility] nurses as been 120's sbp. Wound now heales, to finish HH next wk,  Had catheter out last wk.  Swims in her inground pool occas. No recent falls, walks with walker Past Medical History  Diagnosis Date  . Hypertension   . Hyperlipidemia   . Morbid obesity   . Osteoarthritis   . Gout   . CVA (cerebral infarction)   . IBS (irritable bowel syndrome)   . Diverticulosis   . Esophageal stricture   . Colon polyps     hyperplastic  . Anxiety   . Hiatal hernia     4 cm  . Shingles 02/21/2011  . Skin cancer   . Breast cancer     right breast  . Dysrhythmia     dr bensimhon   . Blood transfusion   . Chronic kidney disease     occ uti's  . GERD (gastroesophageal reflux disease)   . PONV (postoperative nausea and vomiting)   . DVT of leg (deep venous thrombosis)     left; S/P OR  . Atrial flutter     "sometimes"  . Bronchitis   . Pneumonia     "walking"  . Shortness of breath on exertion   . Anemia   . Stroke 2002    residual:  "little tingling in palm of left hand"  . Kidney stones 1990's  . UTI (lower urinary tract infection)     "I've had a few"  . History of radiation therapy 02/15/12-04/02/12    right breast 4680 cGy/26 sessions, right boost=1400cGy /7 sessions   Past Surgical History  Procedure Laterality Date  . Abdominal hysterectomy    . Bile duct stent placement  ~ 01/2011  . Pelvic bone tumor removal      twice in 2000's  . Colonic  perforation repair  ~ 2000  . Total hip arthroplasty  1980's    right  . Joint replacement    . Colon surgery      hole in intestine ,tumors?  . Tonsillectomy  ~ 1946  . Cholecystectomy  03/23/11    lap chole   . Replacement total knee bilateral  ~ 2008; ~ 2010    right; left  . Cataract extraction w/ intraocular lens  implant, bilateral  1990's  . Hemorrhoid surgery  2012  . Breast lumpectomy  06/29/11    right  . Breast lumpectomy  06/29/2011    Procedure: BREAST LUMPECTOMY WITH EXCISION OF SENTINEL NODE;  Surgeon: Pedro Earls, MD;  Location: [place];  Service: General;  Laterality: Right;  right sentinel node mapping,right sentinel node biopsy, needle localization right breast lumpectomy  . Incision and drainage breast abscess      right breast seroma  . Incision and drainage abscess N/A 08/12/2013    Procedure: INCISION AND DRAINAGE ABSCESS;  Surgeon: Earnstine Regal, MD;  Location[facility];  Service: General;  Laterality: N/A;  .  Irrigation and debridement abscess N/A 08/14/2013    Procedure: Debridment of perineal tissue and debridement of perineal wound;  Surgeon: Earnstine Regal, MD;  Location[facility];  Service: General;  Laterality: N/A;  . Irrigation and debridement abscess N/A 08/16/2013    Procedure: DRESSING CHANGE AND DEBRIDEMENT OF PERINEUM WOUND;  Surgeon: Earnstine Regal, MD;  [facility];  Service: General;  Laterality: N/A;  . Pilonidal cyst drainage N/A 08/19/2013    Procedure: Dressing change perineum;  Surgeon: Ralene Ok, MD;  [facility];  Service: General;  Laterality: N/A;  . Minor application of wound vac N/A 08/20/2013    Procedure: MINOR APPLICATION OF WOUND VAC;  Surgeon: Ralene Ok, MD;  [facility];  Service: General;  Laterality: N/A;  . Incision and drainage abscess N/A 08/20/2013    Procedure: INCISION AND DRAINAGE ABSCESS;  Surgeon: Ralene Ok, MD;  [facility];  Service: General;  Laterality: N/A;    reports that she has never  smoked. She has never used smokeless tobacco. She reports that she does not drink alcohol or use illicit drugs. family history includes Breast cancer in her sister; Diabetes in her sister; Heart disease in her maternal uncle and paternal uncle; Kidney failure in her sister; Prostate cancer in her brother; Stroke in her mother. There is no history of Colon cancer. Allergies  Allergen Reactions  . Ace Inhibitors Other (See Comments)    Tolerates lisinopril at home. Pt does not recall allergy.  . Atorvastatin     REACTION: myalgias  . Oxycodone-Acetaminophen     REACTION: confusion, fatigue  . Rosuvastatin     REACTION: leg weakness  . Simvastatin     REACTION: leg weakness  . Codeine Nausea And Vomiting and Rash  . Sulfonamide Derivatives Hives and Rash   Current Outpatient Prescriptions on File Prior to Visit  Medication Sig Dispense Refill  . allopurinol (ZYLOPRIM) 100 MG tablet Take 100-200 mg by mouth every other day. Take 100 mg and then 200 mg on alternating days.      Marland Kitchen anastrozole (ARIMIDEX) 1 MG tablet Take 1 mg by mouth every morning.      Marland Kitchen aspirin 325 MG EC tablet Take 325 mg by mouth every other day.      Marland Kitchen aspirin EC 81 MG tablet Take 243 mg by mouth every other day.       . cholecalciferol (VITAMIN D) 1000 UNITS tablet Take 1,000 Units by mouth every morning.       . furosemide (LASIX) 40 MG tablet Take 1 tablet (40 mg total) by mouth every morning. Start 10/16/13  30 tablet  0  . hydrocortisone (ANUSOL-HC) 25 MG suppository Place 1 suppository (25 mg total) rectally daily.  12 suppository  0  . lisinopril (PRINIVIL,ZESTRIL) 20 MG tablet TAKE TWO TABLETS BY MOUTH ONCE DAILY  180 tablet  3  . polyvinyl alcohol (LIQUIFILM TEARS) 1.4 % ophthalmic solution Place 2 drops into both eyes daily as needed for dry eyes.      Marland Kitchen saccharomyces boulardii (FLORASTOR) 250 MG capsule Take 250 mg by mouth daily.      . famotidine (PEPCID) 20 MG tablet Take 1 tablet (20 mg total) by mouth  daily.       No current facility-administered medications on file prior to visit.   Review of Systems  Constitutional: Negative for unusual diaphoresis or other sweats  HENT: Negative for ringing in ear Eyes: Negative for double vision or worsening visual disturbance.  Respiratory: Negative for choking and stridor.   Gastrointestinal: Negative for vomiting or other signifcant bowel change Genitourinary: Negative for hematuria or decreased urine volume.  Musculoskeletal: Negative for other MSK pain or swelling Skin: Negative for color change and worsening wound.  Neurological: Negative for tremors and numbness other than noted  Psychiatric/Behavioral: Negative for decreased concentration or agitation other than above       Objective:   Physical Exam BP 152/84  Pulse 103  Temp(Src) 98.3 F (36.8 C) (Oral)  Wt 230 lb (104.327 kg)  SpO2 92% VS noted,  Constitutional: Pt appears well-developed, well-nourished.  HENT: Head: NCAT.  Right Ear: External ear normal.  Left Ear: External ear normal.  Eyes: . Pupils are equal, round, and reactive to light. Conjunctivae and EOM are normal Neck: Normal range of motion. Neck supple.  Cardiovascular: Normal rate and regular rhythm.   Pulmonary/Chest: Effort normal and breath sounds normal.  Abd:  Soft, NT, ND, + BS Neurological: Pt is alert. Not confused , motor grossly intact Skin: Skin is warm. No rash Psychiatric: Pt behavior is normal. No agitation.     Assessment & Plan:

## 2014-01-02 NOTE — Progress Notes (Signed)
Pre visit review using our clinic review tool, if applicable. No additional management support is needed unless otherwise documented below in the visit note. 

## 2014-01-02 NOTE — Patient Instructions (Signed)
Please continue all other medications as before, and refills have been done if requested.  Please have the pharmacy call with any other refills you may need.  Please continue your efforts at being more active, low cholesterol diet, and weight control.  You are otherwise up to date with prevention measures today.  Please keep your appointments with your specialists as you may have planned  Please return in 6 months, or sooner if needed 

## 2014-01-05 NOTE — Assessment & Plan Note (Signed)
stable overall by history and exam, recent data reviewed with pt, and pt to continue medical treatment as before,  to f/u any worsening symptoms or concerns Lab Results  Component Value Date   HGBA1C 6.7* 10/11/2013

## 2014-01-05 NOTE — Assessment & Plan Note (Signed)
stable overall by history and exam, recent data reviewed with pt, and pt to continue medical treatment as before,  to f/u any worsening symptoms or concerns Lab Results  Component Value Date   CREATININE 1.42* 10/14/2013

## 2014-01-05 NOTE — Assessment & Plan Note (Addendum)
BP at home always < 140/90, o/w stable overall by history and exam, recent data reviewed with pt, and pt to continue medical treatment as before,  to f/u any worsening symptoms or concerns BP Readings from Last 3 Encounters:  01/02/14 152/84  12/17/13 142/78  11/05/13 128/74

## 2014-02-04 ENCOUNTER — Other Ambulatory Visit: Payer: Self-pay | Admitting: Internal Medicine

## 2014-02-11 ENCOUNTER — Telehealth: Payer: Self-pay

## 2014-02-11 MED ORDER — TRAMADOL-ACETAMINOPHEN 37.5-325 MG PO TABS: 1.0000 | ORAL_TABLET | Freq: Four times a day (QID) | ORAL | Status: DC | PRN

## 2014-02-11 NOTE — Telephone Encounter (Signed)
[name] requesting a refill on Ultracet 37.5-325 mg tab. #120 not on current list

## 2014-02-11 NOTE — Telephone Encounter (Signed)
With recent necrotizing fasciitis - ok for pain med refill - Done hardcopy to [facility]

## 2014-02-11 NOTE — Telephone Encounter (Signed)
Faxed hardcopy to [facility]

## 2014-02-18 ENCOUNTER — Encounter: Payer: Self-pay | Admitting: Nurse Practitioner

## 2014-02-18 ENCOUNTER — Ambulatory Visit (HOSPITAL_BASED_OUTPATIENT_CLINIC_OR_DEPARTMENT_OTHER): Payer: Medicare Other | Admitting: Nurse Practitioner

## 2014-02-18 ENCOUNTER — Other Ambulatory Visit (HOSPITAL_BASED_OUTPATIENT_CLINIC_OR_DEPARTMENT_OTHER): Payer: Medicare Other

## 2014-02-18 VITALS — BP 169/67 | HR 86 | Temp 97.8°F | Resp 18 | Ht 65.0 in | Wt 232.8 lb

## 2014-02-18 DIAGNOSIS — M1990 Unspecified osteoarthritis, unspecified site: Secondary | ICD-10-CM

## 2014-02-18 DIAGNOSIS — Z79811 Long term (current) use of aromatase inhibitors: Secondary | ICD-10-CM

## 2014-02-18 DIAGNOSIS — Z853 Personal history of malignant neoplasm of breast: Secondary | ICD-10-CM

## 2014-02-18 DIAGNOSIS — C50911 Malignant neoplasm of unspecified site of right female breast: Secondary | ICD-10-CM

## 2014-02-18 DIAGNOSIS — C50419 Malignant neoplasm of upper-outer quadrant of unspecified female breast: Secondary | ICD-10-CM

## 2014-02-18 LAB — CBC WITH DIFFERENTIAL/PLATELET
BASO%: 0.2 % (ref 0.0–2.0)
Basophils Absolute: 0 10*3/uL (ref 0.0–0.1)
EOS%: 3.2 % (ref 0.0–7.0)
Eosinophils Absolute: 0.3 10*3/uL (ref 0.0–0.5)
HCT: 39.8 % (ref 34.8–46.6)
HGB: 12.3 g/dL (ref 11.6–15.9)
LYMPH%: 26.3 % (ref 14.0–49.7)
MCH: 28.3 pg (ref 25.1–34.0)
MCHC: 30.9 g/dL — ABNORMAL LOW (ref 31.5–36.0)
MCV: 91.5 fL (ref 79.5–101.0)
MONO#: 0.7 10*3/uL (ref 0.1–0.9)
MONO%: 8.3 % (ref 0.0–14.0)
NEUT#: 5.3 10*3/uL (ref 1.5–6.5)
NEUT%: 62 % (ref 38.4–76.8)
Platelets: 198 10*3/uL (ref 145–400)
RBC: 4.35 10*6/uL (ref 3.70–5.45)
RDW: 17.2 % — ABNORMAL HIGH (ref 11.2–14.5)
WBC: 8.5 10*3/uL (ref 3.9–10.3)
lymph#: 2.2 10*3/uL (ref 0.9–3.3)

## 2014-02-18 LAB — COMPREHENSIVE METABOLIC PANEL (CC13)
ALT: 19 U/L (ref 0–55)
AST: 29 U/L (ref 5–34)
Albumin: 3.3 g/dL — ABNORMAL LOW (ref 3.5–5.0)
Alkaline Phosphatase: 100 U/L (ref 40–150)
Anion Gap: 9 mEq/L (ref 3–11)
BUN: 35.4 mg/dL — ABNORMAL HIGH (ref 7.0–26.0)
CO2: 29 mEq/L (ref 22–29)
Calcium: 9.6 mg/dL (ref 8.4–10.4)
Chloride: 105 mEq/L (ref 98–109)
Creatinine: 1.3 mg/dL — ABNORMAL HIGH (ref 0.6–1.1)
Glucose: 166 mg/dl — ABNORMAL HIGH (ref 70–140)
Potassium: 4.3 mEq/L (ref 3.5–5.1)
Sodium: 144 mEq/L (ref 136–145)
Total Bilirubin: 0.71 mg/dL (ref 0.20–1.20)
Total Protein: 7.3 g/dL (ref 6.4–8.3)

## 2014-02-18 NOTE — Progress Notes (Signed)
ID: ANWAR CRILL   DOB: 11/01/1929  MR#: [id number]  BXU#:[id number]  PCP: [name], MD GYN:  None  SU: Dr. Rockne Coons   CHIEF COMPLAINT: right breast cancer CURRENT TREATMENT: anastrozole 46m daily  BREAST CANCER HISTORY:  DDyneshia Baccamis a 80year old [place] who underwent screening mammography in December of 2012 and a follow up right mammogram was recommended.  The mammogram and ultrasound demonstrated a area of abnormality 7 cm from the nipple measuring 1.5 x 1.3 x 1.1 cm with ultrasound of the axilla negative.  A biopsy of the mass performed on 06/09/2011 showed invasive ductal carcinoma, grade I-II,  ER + 100%, PR +100%, Ki-67 9%, HER-2 negative with ratio of 1.20.  MRI scans of the breasts on 06/17/2011 showed a mass measuring 2.8 x 1.7 x 1.3 cm with no other abnormalities seen. The patient underwent a right lumpectomy with sentinel lymph node evaluation on 06/29/2011 by Dr. MHassell Donewith final pathology showing a 2.8 cm, grade 1 invasive ductal carcinoma with no evidence of angiolymphatic invasion with closest surgical margins of 0.2cm. One sentinel lymph node identified was negative for malignancy.  The patient's postoperative course was complicated by a hematoma and she completed a course of antibiotics.  She completed radiation under the care of Dr. MValere Drossfrom 02/15/12 to 04/02/12.  She was started on Arimidex 1 mg daily in 04/2012.  INTERVAL HISTORY:   Ms. CLongoreturns today for follow up of her breast cancer. She has been on anastrozole since November 2013 with occasional hot flashes. The interval history is significant for a hospitalization for "infectious boils" found on the inside of her vagina and around her groin requiring surgery. She is not able to get around well without her walker, and thus does not have an exercise routine.   REVIEW OF SYSTEMS: Ms. CKrushurts all over and has an extensive history of arthritis before starting anastrozole. She now takes ultracet for  this every 6 hrs PRN. She denies fevers, chills, nausea, vomiting, or changes in bowel or bladder habits. She denies shortness of breath, palpitations, or chest pain. A detailed review of systems was otherwise noncontributory.   PAST MEDICAL HISTORY: Past Medical History  Diagnosis Date  . Hypertension   . Hyperlipidemia   . Morbid obesity   . Osteoarthritis   . Gout   . CVA (cerebral infarction)   . IBS (irritable bowel syndrome)   . Diverticulosis   . Esophageal stricture   . Colon polyps     hyperplastic  . Anxiety   . Hiatal hernia     4 cm  . Shingles 02/21/2011  . Skin cancer   . Breast cancer     right breast  . Dysrhythmia     dr bensimhon   . Blood transfusion   . Chronic kidney disease     occ uti's  . GERD (gastroesophageal reflux disease)   . PONV (postoperative nausea and vomiting)   . DVT of leg (deep venous thrombosis)     left; S/P OR  . Atrial flutter     "sometimes"  . Bronchitis   . Pneumonia     "walking"  . Shortness of breath on exertion   . Anemia   . Stroke 2002    residual:  "little tingling in palm of left hand"  . Kidney stones 1990's  . UTI (lower urinary tract infection)     "I've had a few"  . History of radiation therapy 02/15/12-04/02/12  right breast 4680 cGy/26 sessions, right boost=1400cGy /7 sessions    PAST SURGICAL HISTORY: Past Surgical History  Procedure Laterality Date  . Abdominal hysterectomy    . Bile duct stent placement  ~ 01/2011  . Pelvic bone tumor removal      twice in 2000's  . Colonic perforation repair  ~ 2000  . Total hip arthroplasty  1980's    right  . Joint replacement    . Colon surgery      hole in intestine ,tumors?  . Tonsillectomy  ~ 1946  . Cholecystectomy  03/23/11    lap chole   . Replacement total knee bilateral  ~ 2008; ~ 2010    right; left  . Cataract extraction w/ intraocular lens  implant, bilateral  1990's  . Hemorrhoid surgery  2012  . Breast lumpectomy  06/29/11    right  .  Breast lumpectomy  06/29/2011    Procedure: BREAST LUMPECTOMY WITH EXCISION OF SENTINEL NODE;  Surgeon: Pedro Earls, MD;  Location: [place];  Service: General;  Laterality: Right;  right sentinel node mapping,right sentinel node biopsy, needle localization right breast lumpectomy  . Incision and drainage breast abscess      right breast seroma  . Incision and drainage abscess N/A 08/12/2013    Procedure: INCISION AND DRAINAGE ABSCESS;  Surgeon: Earnstine Regal, MD;  [facility];  Service: General;  Laterality: N/A;  . Irrigation and debridement abscess N/A 08/14/2013    Procedure: Debridment of perineal tissue and debridement of perineal wound;  Surgeon: Earnstine Regal, MD;  [facility];  Service: General;  Laterality: N/A;  . Irrigation and debridement abscess N/A 08/16/2013    Procedure: DRESSING CHANGE AND DEBRIDEMENT OF PERINEUM WOUND;  Surgeon: Earnstine Regal, MD;  [facility];  Service: General;  Laterality: N/A;  . Pilonidal cyst drainage N/A 08/19/2013    Procedure: Dressing change perineum;  Surgeon: Ralene Ok, MD;  [facility];  Service: General;  Laterality: N/A;  . Minor application of wound vac N/A 08/20/2013    Procedure: MINOR APPLICATION OF WOUND VAC;  Surgeon: Ralene Ok, MD;  [facility];  Service: General;  Laterality: N/A;  . Incision and drainage abscess N/A 08/20/2013    Procedure: INCISION AND DRAINAGE ABSCESS;  Surgeon: Ralene Ok, MD;  [facility];  Service: General;  Laterality: N/A;    FAMILY HISTORY Family History  Problem Relation Age of Onset  . Breast cancer Sister     x 2  . Diabetes Sister   . Prostate cancer Brother   . Colon cancer Neg Hx   . Stroke Mother   . Kidney failure Sister   . Heart disease Paternal Uncle     multiple  . Heart disease Maternal Uncle     multiple    GYNECOLOGIC HISTORY:  G 5 P 2, menarche-15, menopause at time of hysterectomy, no long term use of HRT  SOCIAL HISTORY:  She lives at home  with her husband and has been married for over 102 years.  She has two sons: one who lives in [place] and one who lives 2 miles away from her.  She continues to cut hair, with a maximum of 5 people a day.  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    ADVANCED DIRECTIVES:  Living will.  HEALTH MAINTENANCE: History  Substance Use Topics  . Smoking status: Never Smoker   . Smokeless tobacco: Never Used  . Alcohol Use:  No     Colonoscopy:  Last year due to rectal bleeding, which was related to hemorrhoids  PAP:  Has had hysterectomy  Bone density:  08/2011 resulting normal  Lipid panel:  04/2012 with total cholesterol of 219, trig 276, HDL 31.30, LDL 55.2  Allergies  Allergen Reactions  . Ace Inhibitors Other (See Comments)    Tolerates lisinopril at home. Pt does not recall allergy.  . Atorvastatin     REACTION: myalgias  . Oxycodone-Acetaminophen     REACTION: confusion, fatigue  . Rosuvastatin     REACTION: leg weakness  . Simvastatin     REACTION: leg weakness  . Codeine Nausea And Vomiting and Rash  . Sulfonamide Derivatives Hives and Rash    Current Outpatient Prescriptions  Medication Sig Dispense Refill  . allopurinol (ZYLOPRIM) 100 MG tablet Take 100-200 mg by mouth every other day. Take 100 mg and then 200 mg on alternating days.      Marland Kitchen anastrozole (ARIMIDEX) 1 MG tablet Take 1 mg by mouth every morning.      Marland Kitchen aspirin 325 MG EC tablet Take 325 mg by mouth every other day.      Marland Kitchen aspirin EC 81 MG tablet Take 243 mg by mouth every other day.       . cholecalciferol (VITAMIN D) 1000 UNITS tablet Take 1,000 Units by mouth every morning.       . furosemide (LASIX) 40 MG tablet Take 1 tablet (40 mg total) by mouth every morning. Start 10/16/13  30 tablet  0  . lisinopril (PRINIVIL,ZESTRIL) 20 MG tablet TAKE TWO TABLETS BY MOUTH ONCE DAILY  180 tablet  3  . Polyethyl Glycol-Propyl Glycol (SYSTANE OP) Apply 1 drop to eye every morning. Pt uses drops to both eyes.      Marland Kitchen  saccharomyces boulardii (FLORASTOR) 250 MG capsule Take 250 mg by mouth daily.      . traMADol-acetaminophen (ULTRACET) 37.5-325 MG per tablet Take 1 tablet by mouth every 6 (six) hours as needed.  120 tablet  1  . famotidine (PEPCID) 20 MG tablet Take 1 tablet (20 mg total) by mouth daily.       No current facility-administered medications for this visit.    OBJECTIVE: Elderly white woman who appears stated age 80 Vitals:   02/18/14 1509  BP: 169/67  Pulse: 86  Temp: 97.8 F (36.6 C)  Resp: 18     Body mass index is 38.74 kg/(m^2).    ECOG FS: 1 Skin: warm, dry  HEENT: sclerae anicteric, conjunctivae pink, oropharynx clear. No thrush or mucositis.  Lymph Nodes: No cervical or supraclavicular lymphadenopathy  Lungs: clear to auscultation bilaterally, no rales, wheezes, or rhonci  Heart: regular rate and rhythm  Abdomen: round, soft, non tender, positive bowel sounds  Musculoskeletal: mild kyphosis No focal spinal tenderness, no peripheral edema  Neuro: non focal, well oriented, positive affect  Breast: left breast unremarkable, left axilla benign. Right breast status post lumpectomy and radiation. No evidence of disease recurrence, right axilla benign.    LAB RESULTS: Lab Results  Component Value Date   WBC 8.5 02/18/2014   NEUTROABS 5.3 02/18/2014   HGB 12.3 02/18/2014   HCT 39.8 02/18/2014   MCV 91.5 02/18/2014   PLT 198 02/18/2014      Chemistry      Component Value Date/Time   NA 144 02/18/2014 1445   NA 141 10/14/2013 0503   K 4.3 02/18/2014 1445   K 4.2 10/14/2013 0503  CL 105 10/14/2013 0503   CO2 29 02/18/2014 1445   CO2 26 10/14/2013 0503   BUN 35.4* 02/18/2014 1445   BUN 23 10/14/2013 0503   CREATININE 1.3* 02/18/2014 1445   CREATININE 1.42* 10/14/2013 0503      Component Value Date/Time   CALCIUM 9.6 02/18/2014 1445   CALCIUM 8.5 10/14/2013 0503   ALKPHOS 100 02/18/2014 1445   ALKPHOS 83 10/10/2013 1449   AST 29 02/18/2014 1445   AST 20 10/10/2013 1449   ALT 19 02/18/2014 1445   ALT 14  10/10/2013 1449   BILITOT 0.71 02/18/2014 1445   BILITOT 0.6 10/10/2013 1449       Lab Results  Component Value Date   LABCA2 20 08/01/2011    No components found with this basename: [id number]    No results found for this basename: INR,  in the last 168 hours  Urinalysis    Component Value Date/Time   COLORURINE AMBER* 08/12/2013 1324   APPEARANCEUR CLOUDY* 08/12/2013 1324   LABSPEC 1.019 08/12/2013 1324   PHURINE 5.0 08/12/2013 1324   GLUCOSEU NEGATIVE 08/12/2013 1324   GLUCOSEU NEGATIVE 08/17/2010 1434   HGBUR SMALL* 08/12/2013 1324   BILIRUBINUR SMALL* 08/12/2013 1324   KETONESUR NEGATIVE 08/12/2013 1324   PROTEINUR NEGATIVE 08/12/2013 1324   UROBILINOGEN 1.0 08/12/2013 1324   NITRITE NEGATIVE 08/12/2013 1324   LEUKOCYTESUR NEGATIVE 08/12/2013 1324    STUDIES: No results found.  ASSESSMENT: 80 y.o. [place] woman:  #1 S/p right lumpectomy and sentinel lymph node biopsy on 06/29/11 for a pT2 pN0, stage IIA IDC Grade I, ER+, PR+, Ki67 9%, HER2 -.  #2 She completed radiation therapy from 02/25/12 to 04/02/12.  #3 She has been on Arimidex 1 mg daily since 04/2012. She had a normal bone density March of 2013  PLAN:   Ms. Roane is doing well as far as her breast cancer is concerned, and is tolerating the anastrozole well. The labs were reviewed, and were stable. She is overdue for a mammogram, so orders for this have been placed this visit. I don't believe her arthritis is due to the anastrozole, as this ailment was present before the drug was started. She will continue the anastrozole daily for now, with yearly visits until she completes 5 years of antiestrogen therapy.   Ms. Rister understands and agrees with this plan. She has been encouraged to call with any issues that might arise before her next visit here.   Marcelino Duster    02/18/2014

## 2014-02-19 ENCOUNTER — Telehealth: Payer: Self-pay | Admitting: Oncology

## 2014-02-19 NOTE — Telephone Encounter (Signed)
cld & spoke to pt in re to appt time & mamma-pt stated could not keep mamma appt-gave pt # to call to r/s-adv I would mail copy of sch out to pt

## 2014-02-26 ENCOUNTER — Ambulatory Visit (INDEPENDENT_AMBULATORY_CARE_PROVIDER_SITE_OTHER): Payer: Medicare Other | Admitting: Surgery

## 2014-03-04 ENCOUNTER — Other Ambulatory Visit: Payer: Self-pay | Admitting: Internal Medicine

## 2014-03-05 ENCOUNTER — Telehealth: Payer: Self-pay | Admitting: Internal Medicine

## 2014-03-05 MED ORDER — FUROSEMIDE 40 MG PO TABS: 40.0000 mg | ORAL_TABLET | Freq: Every morning | ORAL | Status: DC

## 2014-03-05 NOTE — Telephone Encounter (Signed)
Called the patient informed lasix sent in as requested to [facility]

## 2014-03-05 NOTE — Telephone Encounter (Signed)
Patient states she needs lasix refilled.  She was in back in July and really does not want to come in again.  Please advise.

## 2014-04-02 ENCOUNTER — Ambulatory Visit
Admission: RE | Admit: 2014-04-02 | Discharge: 2014-04-02 | Disposition: A | Discharge status: Home / Self Care | Payer: Medicare Other | Source: Ambulatory Visit | Attending: Surgery | Admitting: Surgery

## 2014-04-02 DIAGNOSIS — Z9889 Other specified postprocedural states: Secondary | ICD-10-CM

## 2014-04-02 DIAGNOSIS — Z853 Personal history of malignant neoplasm of breast: Secondary | ICD-10-CM

## 2014-06-10 ENCOUNTER — Other Ambulatory Visit: Payer: Self-pay | Admitting: Oncology

## 2014-06-10 DIAGNOSIS — C50911 Malignant neoplasm of unspecified site of right female breast: Secondary | ICD-10-CM

## 2014-06-16 DIAGNOSIS — Z96651 Presence of right artificial knee joint: Secondary | ICD-10-CM | POA: Diagnosis not present

## 2014-06-16 DIAGNOSIS — Z96641 Presence of right artificial hip joint: Secondary | ICD-10-CM | POA: Diagnosis not present

## 2014-06-16 DIAGNOSIS — Z471 Aftercare following joint replacement surgery: Secondary | ICD-10-CM | POA: Diagnosis not present

## 2014-06-18 ENCOUNTER — Inpatient Hospital Stay (HOSPITAL_COMMUNITY)
Admission: EM | Admit: 2014-06-18 | Discharge: 2014-06-23 | DRG: 378 | Disposition: A | Discharge status: Home / Self Care | Payer: Medicare Other | Attending: Internal Medicine | Admitting: Internal Medicine

## 2014-06-18 ENCOUNTER — Emergency Department (HOSPITAL_COMMUNITY): Payer: Medicare Other

## 2014-06-18 ENCOUNTER — Encounter (HOSPITAL_COMMUNITY): Payer: Self-pay | Admitting: Emergency Medicine

## 2014-06-18 DIAGNOSIS — B9620 Unspecified Escherichia coli [E. coli] as the cause of diseases classified elsewhere: Secondary | ICD-10-CM | POA: Diagnosis not present

## 2014-06-18 DIAGNOSIS — I5032 Chronic diastolic (congestive) heart failure: Secondary | ICD-10-CM | POA: Diagnosis present

## 2014-06-18 DIAGNOSIS — Z882 Allergy status to sulfonamides status: Secondary | ICD-10-CM | POA: Diagnosis not present

## 2014-06-18 DIAGNOSIS — Z823 Family history of stroke: Secondary | ICD-10-CM | POA: Diagnosis not present

## 2014-06-18 DIAGNOSIS — K922 Gastrointestinal hemorrhage, unspecified: Secondary | ICD-10-CM | POA: Diagnosis not present

## 2014-06-18 DIAGNOSIS — M25559 Pain in unspecified hip: Secondary | ICD-10-CM | POA: Insufficient documentation

## 2014-06-18 DIAGNOSIS — Z833 Family history of diabetes mellitus: Secondary | ICD-10-CM

## 2014-06-18 DIAGNOSIS — K219 Gastro-esophageal reflux disease without esophagitis: Secondary | ICD-10-CM | POA: Diagnosis present

## 2014-06-18 DIAGNOSIS — Z888 Allergy status to other drugs, medicaments and biological substances status: Secondary | ICD-10-CM | POA: Diagnosis not present

## 2014-06-18 DIAGNOSIS — D62 Acute posthemorrhagic anemia: Secondary | ICD-10-CM | POA: Diagnosis not present

## 2014-06-18 DIAGNOSIS — I129 Hypertensive chronic kidney disease with stage 1 through stage 4 chronic kidney disease, or unspecified chronic kidney disease: Secondary | ICD-10-CM | POA: Diagnosis not present

## 2014-06-18 DIAGNOSIS — Z96641 Presence of right artificial hip joint: Secondary | ICD-10-CM | POA: Diagnosis not present

## 2014-06-18 DIAGNOSIS — M25551 Pain in right hip: Secondary | ICD-10-CM | POA: Diagnosis not present

## 2014-06-18 DIAGNOSIS — Z8673 Personal history of transient ischemic attack (TIA), and cerebral infarction without residual deficits: Secondary | ICD-10-CM | POA: Diagnosis not present

## 2014-06-18 DIAGNOSIS — K5751 Diverticulosis of both small and large intestine without perforation or abscess with bleeding: Principal | ICD-10-CM | POA: Diagnosis present

## 2014-06-18 DIAGNOSIS — N390 Urinary tract infection, site not specified: Secondary | ICD-10-CM

## 2014-06-18 DIAGNOSIS — Z1611 Resistance to penicillins: Secondary | ICD-10-CM | POA: Diagnosis not present

## 2014-06-18 DIAGNOSIS — C50911 Malignant neoplasm of unspecified site of right female breast: Secondary | ICD-10-CM | POA: Diagnosis present

## 2014-06-18 DIAGNOSIS — Z96653 Presence of artificial knee joint, bilateral: Secondary | ICD-10-CM | POA: Diagnosis not present

## 2014-06-18 DIAGNOSIS — N182 Chronic kidney disease, stage 2 (mild): Secondary | ICD-10-CM | POA: Diagnosis not present

## 2014-06-18 DIAGNOSIS — Z7982 Long term (current) use of aspirin: Secondary | ICD-10-CM | POA: Diagnosis not present

## 2014-06-18 DIAGNOSIS — D500 Iron deficiency anemia secondary to blood loss (chronic): Secondary | ICD-10-CM | POA: Diagnosis present

## 2014-06-18 DIAGNOSIS — R109 Unspecified abdominal pain: Secondary | ICD-10-CM | POA: Insufficient documentation

## 2014-06-18 DIAGNOSIS — B9689 Other specified bacterial agents as the cause of diseases classified elsewhere: Secondary | ICD-10-CM | POA: Diagnosis not present

## 2014-06-18 DIAGNOSIS — M109 Gout, unspecified: Secondary | ICD-10-CM | POA: Diagnosis not present

## 2014-06-18 DIAGNOSIS — R1031 Right lower quadrant pain: Secondary | ICD-10-CM | POA: Diagnosis not present

## 2014-06-18 DIAGNOSIS — K921 Melena: Secondary | ICD-10-CM | POA: Diagnosis present

## 2014-06-18 DIAGNOSIS — Z803 Family history of malignant neoplasm of breast: Secondary | ICD-10-CM | POA: Diagnosis not present

## 2014-06-18 DIAGNOSIS — E119 Type 2 diabetes mellitus without complications: Secondary | ICD-10-CM | POA: Diagnosis present

## 2014-06-18 DIAGNOSIS — K5791 Diverticulosis of intestine, part unspecified, without perforation or abscess with bleeding: Secondary | ICD-10-CM

## 2014-06-18 DIAGNOSIS — M899 Disorder of bone, unspecified: Secondary | ICD-10-CM | POA: Diagnosis not present

## 2014-06-18 DIAGNOSIS — Z1619 Resistance to other specified beta lactam antibiotics: Secondary | ICD-10-CM | POA: Diagnosis not present

## 2014-06-18 DIAGNOSIS — R102 Pelvic and perineal pain: Secondary | ICD-10-CM

## 2014-06-18 DIAGNOSIS — R1011 Right upper quadrant pain: Secondary | ICD-10-CM | POA: Diagnosis not present

## 2014-06-18 LAB — URINALYSIS, ROUTINE W REFLEX MICROSCOPIC
Bilirubin Urine: NEGATIVE
Glucose, UA: NEGATIVE mg/dL
Hgb urine dipstick: NEGATIVE
Ketones, ur: NEGATIVE mg/dL
Nitrite: POSITIVE — AB
Protein, ur: NEGATIVE mg/dL
Specific Gravity, Urine: 1.018 (ref 1.005–1.030)
Urobilinogen, UA: 0.2 mg/dL (ref 0.0–1.0)
pH: 5.5 (ref 5.0–8.0)

## 2014-06-18 LAB — COMPREHENSIVE METABOLIC PANEL
ALT: 16 U/L (ref 0–35)
AST: 24 U/L (ref 0–37)
Albumin: 3.4 g/dL — ABNORMAL LOW (ref 3.5–5.2)
Alkaline Phosphatase: 86 U/L (ref 39–117)
Anion gap: 6 (ref 5–15)
BUN: 45 mg/dL — ABNORMAL HIGH (ref 6–23)
CO2: 27 mmol/L (ref 19–32)
Calcium: 8.9 mg/dL (ref 8.4–10.5)
Chloride: 107 mEq/L (ref 96–112)
Creatinine, Ser: 1.55 mg/dL — ABNORMAL HIGH (ref 0.50–1.10)
GFR calc Af Amer: 35 mL/min — ABNORMAL LOW (ref >90)
GFR calc non Af Amer: 30 mL/min — ABNORMAL LOW (ref >90)
Glucose, Bld: 130 mg/dL — ABNORMAL HIGH (ref 70–99)
Potassium: 4.3 mmol/L (ref 3.5–5.1)
Sodium: 140 mmol/L (ref 135–145)
Total Bilirubin: 0.9 mg/dL (ref 0.3–1.2)
Total Protein: 6.6 g/dL (ref 6.0–8.3)

## 2014-06-18 LAB — CBC WITH DIFFERENTIAL/PLATELET
Basophils Absolute: 0 10*3/uL (ref 0.0–0.1)
Basophils Relative: 0 % (ref 0–1)
Eosinophils Absolute: 0.3 10*3/uL (ref 0.0–0.7)
Eosinophils Relative: 3 % (ref 0–5)
HCT: 35.4 % — ABNORMAL LOW (ref 36.0–46.0)
Hemoglobin: 10.8 g/dL — ABNORMAL LOW (ref 12.0–15.0)
Lymphocytes Relative: 20 % (ref 12–46)
Lymphs Abs: 2.1 10*3/uL (ref 0.7–4.0)
MCH: 28.3 pg (ref 26.0–34.0)
MCHC: 30.5 g/dL (ref 30.0–36.0)
MCV: 92.7 fL (ref 78.0–100.0)
Monocytes Absolute: 0.8 10*3/uL (ref 0.1–1.0)
Monocytes Relative: 8 % (ref 3–12)
Neutro Abs: 7.2 10*3/uL (ref 1.7–7.7)
Neutrophils Relative %: 69 % (ref 43–77)
Platelets: 195 10*3/uL (ref 150–400)
RBC: 3.82 MIL/uL — ABNORMAL LOW (ref 3.87–5.11)
RDW: 16.4 % — ABNORMAL HIGH (ref 11.5–15.5)
WBC: 10.4 10*3/uL (ref 4.0–10.5)

## 2014-06-18 LAB — TROPONIN I: Troponin I: <0.03 ng/mL (ref <0.031)

## 2014-06-18 LAB — GLUCOSE, CAPILLARY
Glucose-Capillary: 124 mg/dL — ABNORMAL HIGH (ref 70–99)
Glucose-Capillary: 116 mg/dL — ABNORMAL HIGH (ref 70–99)
Glucose-Capillary: 123 mg/dL — ABNORMAL HIGH (ref 70–99)
Glucose-Capillary: 107 mg/dL — ABNORMAL HIGH (ref 70–99)
Glucose-Capillary: 116 mg/dL — ABNORMAL HIGH (ref 70–99)

## 2014-06-18 LAB — CBC
HCT: 31.3 % — ABNORMAL LOW (ref 36.0–46.0)
HCT: 28.8 % — ABNORMAL LOW (ref 36.0–46.0)
HCT: 25.7 % — ABNORMAL LOW (ref 36.0–46.0)
Hemoglobin: 9.6 g/dL — ABNORMAL LOW (ref 12.0–15.0)
Hemoglobin: 8.7 g/dL — ABNORMAL LOW (ref 12.0–15.0)
Hemoglobin: 8 g/dL — ABNORMAL LOW (ref 12.0–15.0)
MCH: 28.3 pg (ref 26.0–34.0)
MCH: 28.3 pg (ref 26.0–34.0)
MCH: 29.3 pg (ref 26.0–34.0)
MCHC: 30.7 g/dL (ref 30.0–36.0)
MCHC: 30.2 g/dL (ref 30.0–36.0)
MCHC: 31.1 g/dL (ref 30.0–36.0)
MCV: 92.3 fL (ref 78.0–100.0)
MCV: 93.8 fL (ref 78.0–100.0)
MCV: 94.1 fL (ref 78.0–100.0)
Platelets: 191 10*3/uL (ref 150–400)
Platelets: 198 10*3/uL (ref 150–400)
Platelets: 183 10*3/uL (ref 150–400)
RBC: 3.39 MIL/uL — ABNORMAL LOW (ref 3.87–5.11)
RBC: 3.07 MIL/uL — ABNORMAL LOW (ref 3.87–5.11)
RBC: 2.73 MIL/uL — ABNORMAL LOW (ref 3.87–5.11)
RDW: 16.2 % — ABNORMAL HIGH (ref 11.5–15.5)
RDW: 16.4 % — ABNORMAL HIGH (ref 11.5–15.5)
RDW: 16.6 % — ABNORMAL HIGH (ref 11.5–15.5)
WBC: 9.7 10*3/uL (ref 4.0–10.5)
WBC: 7.9 10*3/uL (ref 4.0–10.5)
WBC: 7.7 10*3/uL (ref 4.0–10.5)

## 2014-06-18 LAB — HEMOGLOBIN A1C
Hgb A1c MFr Bld: 6.8 % — ABNORMAL HIGH (ref <5.7)
Mean Plasma Glucose: 148 mg/dL — ABNORMAL HIGH (ref <117)

## 2014-06-18 LAB — POC OCCULT BLOOD, ED: Fecal Occult Bld: POSITIVE — AB

## 2014-06-18 LAB — PROTIME-INR
INR: 1.05 (ref 0.00–1.49)
Prothrombin Time: 13.8 seconds (ref 11.6–15.2)

## 2014-06-18 LAB — MRSA PCR SCREENING: MRSA by PCR: NEGATIVE

## 2014-06-18 LAB — PREPARE RBC (CROSSMATCH)

## 2014-06-18 LAB — URINE MICROSCOPIC-ADD ON

## 2014-06-18 LAB — LIPASE, BLOOD: Lipase: 27 U/L (ref 11–59)

## 2014-06-18 LAB — APTT: aPTT: 37 seconds (ref 24–37)

## 2014-06-18 MED ORDER — CETYLPYRIDINIUM CHLORIDE 0.05 % MT LIQD
7.0000 mL | Freq: Two times a day (BID) | OROMUCOSAL | Status: DC
  Administered 2014-06-18: 7 mL via OROMUCOSAL

## 2014-06-18 MED ORDER — IOHEXOL 300 MG/ML  SOLN
80.0000 mL | Freq: Once | INTRAMUSCULAR | Status: AC | PRN
  Administered 2014-06-18: 80 mL via INTRAVENOUS

## 2014-06-18 MED ORDER — SODIUM CHLORIDE 0.9 % IV SOLN: Freq: Once | INTRAVENOUS | Status: DC

## 2014-06-18 MED ORDER — CHLORHEXIDINE GLUCONATE 0.12 % MT SOLN
15.0000 mL | Freq: Two times a day (BID) | OROMUCOSAL | Status: DC
  Administered 2014-06-18 – 2014-06-19 (×3): 15 mL via OROMUCOSAL
  Filled 2014-06-18 (×7): qty 15

## 2014-06-18 MED ORDER — SODIUM CHLORIDE 0.9 % IV BOLUS (SEPSIS)
500.0000 mL | Freq: Once | INTRAVENOUS | Status: AC
  Administered 2014-06-18: 500 mL via INTRAVENOUS

## 2014-06-18 MED ORDER — INSULIN ASPART 100 UNIT/ML ~~LOC~~ SOLN
0.0000 [IU] | SUBCUTANEOUS | Status: DC
  Administered 2014-06-18 – 2014-06-19 (×3): 1 [IU] via SUBCUTANEOUS
  Administered 2014-06-19: 2 [IU] via SUBCUTANEOUS
  Administered 2014-06-19: 1 [IU] via SUBCUTANEOUS
  Administered 2014-06-19: 2 [IU] via SUBCUTANEOUS
  Administered 2014-06-20 (×2): 1 [IU] via SUBCUTANEOUS
  Administered 2014-06-21 (×2): 2 [IU] via SUBCUTANEOUS
  Administered 2014-06-21 – 2014-06-22 (×2): 1 [IU] via SUBCUTANEOUS

## 2014-06-18 MED ORDER — DEXTROSE 5 % IV SOLN
1.0000 g | Freq: Once | INTRAVENOUS | Status: AC
  Administered 2014-06-18: 1 g via INTRAVENOUS
  Filled 2014-06-18: qty 10

## 2014-06-18 MED ORDER — ACETAMINOPHEN 325 MG PO TABS
650.0000 mg | ORAL_TABLET | ORAL | Status: DC | PRN
  Administered 2014-06-21 – 2014-06-22 (×2): 650 mg via ORAL
  Filled 2014-06-18 (×3): qty 2

## 2014-06-18 MED ORDER — IOHEXOL 300 MG/ML  SOLN
50.0000 mL | Freq: Once | INTRAMUSCULAR | Status: AC | PRN
  Administered 2014-06-18: 50 mL via ORAL

## 2014-06-18 MED ORDER — MORPHINE SULFATE 2 MG/ML IJ SOLN
2.0000 mg | INTRAMUSCULAR | Status: DC | PRN
  Administered 2014-06-18: 2 mg via INTRAVENOUS
  Administered 2014-06-18: 4 mg via INTRAVENOUS
  Administered 2014-06-18 – 2014-06-22 (×11): 2 mg via INTRAVENOUS
  Filled 2014-06-18 (×9): qty 1
  Filled 2014-06-18: qty 2
  Filled 2014-06-18 (×3): qty 1

## 2014-06-18 MED ORDER — DEXTROSE 5 % IV SOLN
1.0000 g | INTRAVENOUS | Status: DC
  Administered 2014-06-19 – 2014-06-23 (×5): 1 g via INTRAVENOUS
  Filled 2014-06-18 (×5): qty 10

## 2014-06-18 MED ORDER — PANTOPRAZOLE SODIUM 40 MG IV SOLR
40.0000 mg | INTRAVENOUS | Status: DC
  Administered 2014-06-18 – 2014-06-19 (×2): 40 mg via INTRAVENOUS
  Filled 2014-06-18 (×2): qty 40

## 2014-06-18 MED ORDER — SODIUM CHLORIDE 0.9 % IV SOLN
INTRAVENOUS | Status: DC
  Administered 2014-06-18: 1000 mL via INTRAVENOUS
  Administered 2014-06-18 – 2014-06-21 (×4): via INTRAVENOUS

## 2014-06-18 NOTE — ED Notes (Signed)
Pt states she is passing blood in her stool since about noon  Pt states she is having pain in her right side  Pt states she has had at least 6 episodes throughout the day   Pt states she fell Sunday morning off a stool and has had pain in her back since   Pt denies vomiting but states she has had some nausea

## 2014-06-18 NOTE — ED Notes (Signed)
Patient is experiencing right flank pain. Having dark red stool.

## 2014-06-18 NOTE — Consult Note (Signed)
Referring Provider: No ref. provider found Primary Care Physician:  [name], MD Primary Gastroenterologist:  Dr. Henrene Pastor   Reason for Consultation:  GI bleed  HPI: [name] is a 81 y.o. female with significant past medical history of CVA, HTN, diverticular disease, chronic diastolic heart failure, stage 3 CKD, Hx/o breast ca.  She presented to [facility] hospital with complaints of rectal bleeding.  Pt reports maroon colored stools that started in the afternoon the day prior to admission.  Says "I was bleeding like a pig".  She also reports right sided abdominal pain that started on 12/31.  No fevers or chills. No vomiting.  Mild nausea. On full dose ASA for prior CVA. Denies any OTC NSAID use.  Says that she saw ortho for the pain because she thought it was her hip.   In the ER she was afebrile, hemodynamically stable. BPs 140s-160s. Satting 95% on RA. WBC 10.4, Hgb 10.8, Cr 1.55, trop WNL. UA posititve for infection so has been started on antibiotics. Had 3-4 large bloody bowel movements in ER, hemoccult positive.  Was admitted to the ICU/SD and has not passed anymore blood in the past 3 hours or so.  CT scan of the abdomen and pelvis with contrast showed the following:  "IMPRESSION: No acute intra-abdominal or pelvic process.  Diverticulosis without CT findings of acute diverticulitis. Moderate amount of retained large bowel stool.  No acute osseous process though, not tailored for evaluation."  Repeat Hgb this AM was 9.6 grams.  Last colonoscopy was in 01/2011 by Dr. Ardis Hughs at which time she had moderate diverticulosis throughout the colon and internal/external hemorrhoids.   Past Medical History  Diagnosis Date  . Hypertension   . Hyperlipidemia   . Morbid obesity   . Osteoarthritis   . Gout   . CVA (cerebral infarction)   . IBS (irritable bowel syndrome)   . Diverticulosis   . Esophageal stricture   . Colon polyps     hyperplastic  . Anxiety   . Hiatal hernia     4 cm  . Shingles 02/21/2011  . Skin cancer   . Breast cancer     right breast  . Dysrhythmia     dr bensimhon   . Blood transfusion   . Chronic kidney disease     occ uti's  . GERD (gastroesophageal reflux disease)   . PONV (postoperative nausea and vomiting)   . DVT of leg (deep venous thrombosis)     left; S/P OR  . Atrial flutter     "sometimes"  . Bronchitis   . Pneumonia     "walking"  . Shortness of breath on exertion   . Anemia   . Stroke 2002    residual:  "little tingling in palm of left hand"  . Kidney stones 1990's  . UTI (lower urinary tract infection)     "I've had a few"  . History of radiation therapy 02/15/12-04/02/12    right breast 4680 cGy/26 sessions, right boost=1400cGy /7 sessions    Past Surgical History  Procedure Laterality Date  . Abdominal hysterectomy    . Bile duct stent placement  ~ 01/2011  . Pelvic bone tumor removal      twice in 2000's  . Colonic perforation repair  ~ 2000  . Total hip arthroplasty  1980's    right  . Joint replacement    . Colon surgery      hole in intestine ,tumors?  . Tonsillectomy  ~  1946  . Cholecystectomy  03/23/11    lap chole   . Replacement total knee bilateral  ~ 2008; ~ 2010    right; left  . Cataract extraction w/ intraocular lens  implant, bilateral  1990's  . Hemorrhoid surgery  2012  . Breast lumpectomy  06/29/11    right  . Breast lumpectomy  06/29/2011    Procedure: BREAST LUMPECTOMY WITH EXCISION OF SENTINEL NODE;  Surgeon: Pedro Earls, MD;  Location: [place];  Service: General;  Laterality: Right;  right sentinel node mapping,right sentinel node biopsy, needle localization right breast lumpectomy  . Incision and drainage breast abscess      right breast seroma  . Incision and drainage abscess N/A 08/12/2013    Procedure: INCISION AND DRAINAGE ABSCESS;  Surgeon: Earnstine Regal, MD;  Location: [facility];  Service: General;  Laterality: N/A;  . Irrigation and debridement abscess N/A 08/14/2013     Procedure: Debridment of perineal tissue and debridement of perineal wound;  Surgeon: Earnstine Regal, MD;  Location: [facility];  Service: General;  Laterality: N/A;  . Irrigation and debridement abscess N/A 08/16/2013    Procedure: DRESSING CHANGE AND DEBRIDEMENT OF PERINEUM WOUND;  Surgeon: Earnstine Regal, MD;  Location: [facility];  Service: General;  Laterality: N/A;  . Pilonidal cyst drainage N/A 08/19/2013    Procedure: Dressing change perineum;  Surgeon: Ralene Ok, MD;  Location: [facility];  Service: General;  Laterality: N/A;  . Minor application of wound vac N/A 08/20/2013    Procedure: MINOR APPLICATION OF WOUND VAC;  Surgeon: Ralene Ok, MD;  Location: [facility];  Service: General;  Laterality: N/A;  . Incision and drainage abscess N/A 08/20/2013    Procedure: INCISION AND DRAINAGE ABSCESS;  Surgeon: Ralene Ok, MD;  Location: [facility];  Service: General;  Laterality: N/A;    Prior to Admission medications   Medication Sig Start Date End Date Taking? Authorizing Provider  allopurinol (ZYLOPRIM) 100 MG tablet Take 100-200 mg by mouth every other day. Take 100 mg and then 200 mg on alternating days.   Yes Historical Provider, MD  anastrozole (ARIMIDEX) 1 MG tablet Take 1 mg by mouth daily.   Yes Historical Provider, MD  aspirin 325 MG EC tablet Take 325 mg by mouth every other day.   Yes Historical Provider, MD  cholecalciferol (VITAMIN D) 1000 UNITS tablet Take 1,000 Units by mouth every morning.    Yes Historical Provider, MD  furosemide (LASIX) 40 MG tablet Take 1 tablet (40 mg total) by mouth every morning. Start 10/16/13 03/05/14  Yes Biagio Borg, MD  lisinopril (PRINIVIL,ZESTRIL) 20 MG tablet Take 40 mg by mouth daily.   Yes Historical Provider, MD  traMADol-acetaminophen (ULTRACET) 37.5-325 MG per tablet Take 1 tablet by mouth every 6 (six) hours as needed. 02/11/14  Yes Biagio Borg, MD  anastrozole (ARIMIDEX) 1 MG tablet TAKE 1 TABLET BY MOUTH ONCE DAILY Patient not taking: Reported on  06/18/2014 06/10/14   Chauncey Cruel, MD  famotidine (PEPCID) 20 MG tablet Take 1 tablet (20 mg total) by mouth daily. 08/29/13 09/05/13  Kinnie Feil, MD  lisinopril (PRINIVIL,ZESTRIL) 20 MG tablet TAKE TWO TABLETS BY MOUTH ONCE DAILY Patient not taking: Reported on 06/18/2014 12/19/13   Biagio Borg, MD    Current Facility-Administered Medications  Medication Dose Route Frequency Provider Last Rate Last Dose  . 0.9 %  sodium chloride infusion   Intravenous Continuous Shanda Howells, MD 75 mL/hr at 06/18/14  [id number]    . 0.9 %  sodium chloride infusion   Intravenous Once Shanda Howells, MD      . acetaminophen (TYLENOL) tablet 650 mg  650 mg Oral Q4H PRN Hosie Poisson, MD      . cefTRIAXone (ROCEPHIN) 1 g in dextrose 5 % 50 mL IVPB  1 g Intravenous Q24H Shanda Howells, MD   0 g at 06/18/14 [id number]  . insulin aspart (novoLOG) injection 0-9 Units  0-9 Units Subcutaneous 6 times per day Shanda Howells, MD   1 Units at 06/18/14 0900  . morphine 2 MG/ML injection 2-4 mg  2-4 mg Intravenous Q2H PRN Shanda Howells, MD   2 mg at 06/18/14 0906  . pantoprazole (PROTONIX) injection 40 mg  40 mg Intravenous Q24H Shanda Howells, MD   40 mg at 06/18/14 0556    Allergies as of 06/18/2014 - Review Complete 06/18/2014  Allergen Reaction Noted  . Ace inhibitors Other (See Comments) 06/29/2011  . Atorvastatin  08/18/2010  . Oxycodone-acetaminophen  04/08/2008  . Rosuvastatin  10/17/2007  . Simvastatin  10/17/2007  . Codeine Nausea And Vomiting and Rash 02/10/2007  . Sulfonamide derivatives Hives and Rash 02/10/2007    Family History  Problem Relation Age of Onset  . Breast cancer Sister     x 2  . Diabetes Sister   . Prostate cancer Brother   . Colon cancer Neg Hx   . Stroke Mother   . Kidney failure Sister   . Heart disease Paternal Uncle     multiple  . Heart disease Maternal Uncle     multiple    History   Social History  . Marital Status: Married    Spouse Name: N/A    Number of Children: N/A  .  Years of Education: N/A   Occupational History  . hairdresser    Social History Main Topics  . Smoking status: Never Smoker   . Smokeless tobacco: Never Used  . Alcohol Use: No  . Drug Use: No  . Sexual Activity: No   Other Topics Concern  . Not on file   Social History Narrative   Lives at home with her husband.  Uses a walker for ambulation    Review of Systems: Ten point ROS is O/W negative except as mentioned in HPI.  Physical Exam: Vital signs in last 24 hours: Temp:  [97.7 F (36.5 C)-98.4 F (36.9 C)] 98.3 F (36.8 C) (01/06 0600) Pulse Rate:  [81-111] 83 (01/06 0900) Resp:  [15-23] 15 (01/06 0900) BP: (102-163)/(36-69) 145/36 mmHg (01/06 0900) SpO2:  [93 %-97 %] 96 % (01/06 0900) Weight:  [220 lb (99.791 kg)-239 lb 3.2 oz (108.5 kg)] 239 lb 3.2 oz (108.5 kg) (01/06 0600)   General:  Alert, Well-developed, well-nourished, pleasant and cooperative in NAD Head:  Normocephalic and atraumatic. Eyes:  Sclera clear, no icterus.  Conjunctiva pink. Ears:  Normal auditory acuity. Mouth:  No deformity or lesions. Lungs:  Clear throughout to auscultation.  No wheezes, crackles, or rhonchi.  Heart:  Regular rate and rhythm; no murmurs, clicks, rubs, or gallops. Abdomen:  Soft, non-distended.  BS present.  TTP in RLQ/suprapubic region.  Rectal:  Not performed. Msk:  Symmetrical without gross deformities. Pulses:  Normal pulses noted. Extremities:  Without clubbing or edema. Neurologic:  Alert and  oriented x4;  grossly normal neurologically. Skin:  Intact without significant lesions or rashes. Psych:  Alert and cooperative. Normal mood and affect.  Intake/Output from previous day: 01/05 0701 - 01/06  0700 In: 75 [I.V.:75] Out: 100 [Urine:100]  Lab Results:  Recent Labs  06/18/14 0113 06/18/14 0551  WBC 10.4 9.7  HGB 10.8* 9.6*  HCT 35.4* 31.3*  PLT 195 191   BMET  Recent Labs  06/18/14 0113  NA 140  K 4.3  CL 107  CO2 27  GLUCOSE 130*  BUN 45*    CREATININE 1.55*  CALCIUM 8.9   LFT  Recent Labs  06/18/14 0113  PROT 6.6  ALBUMIN 3.4*  AST 24  ALT 16  ALKPHOS 86  BILITOT 0.9   PT/INR  Recent Labs  06/18/14 0113  LABPROT 13.8  INR 1.05   Studies/Results: Ct Abdomen Pelvis W Contrast  06/18/2014   CLINICAL DATA:  Blood in stools since June 17, 2014. RIGHT abdominal pain. History of breast cancer, colon surgery, his cholecystectomy, hysterectomy. Fall from stool June 15, 2014. Back pain.  EXAM: CT ABDOMEN AND PELVIS WITH CONTRAST  TECHNIQUE: Multidetector CT imaging of the abdomen and pelvis was performed using the standard protocol following bolus administration of intravenous contrast.  CONTRAST:  29mL OMNIPAQUE IOHEXOL 300 MG/ML  SOLN  COMPARISON:  CT of the abdomen and pelvis October 10, 2013  FINDINGS: LUNG BASES: Included view of the lung bases are clear. Visualized heart and pericardium are unremarkable.  SOLID ORGANS: Calcified splenic granulomas. Pneumobilia, status post cholecystectomy. Hepatic granuloma. Pancreas, adrenal glands are nonsuspicious.  GASTROINTESTINAL TRACT: The stomach, small and large bowel are normal in course and caliber without inflammatory changes. Moderate amount of retained large bowel stool. Moderate colonic diverticulosis. Normal appendix.  KIDNEYS/ URINARY TRACT: Kidneys are orthotopic, demonstrating symmetric enhancement. Mildly atrophic kidneys. No nephrolithiasis, hydronephrosis or solid renal masses. The unopacified ureters are normal in course and caliber. Delayed imaging through the kidneys demonstrates symmetric prompt contrast excretion within the proximal urinary collecting system. Urinary bladder is partially distended and unremarkable.  PERITONEUM/RETROPERITONEUM: No intraperitoneal free fluid nor free air. Aortoiliac vessels are normal in course and caliber, moderate calcific atherosclerosis. No lymphadenopathy by CT size criteria. Status post hysterectomy.  SOFT TISSUE/OSSEOUS  STRUCTURES: Nonsuspicious. RIGHT hip total arthroplasty. Remote LEFT L3 transverse process fracture. Moderate to severe degenerative change of the lower lumbar spine. Anterior abdominal wall scarring.  IMPRESSION: No acute intra-abdominal or pelvic process.  Diverticulosis without CT findings of acute diverticulitis. Moderate amount of retained large bowel stool.  No acute osseous process though, not tailored for evaluation.   Electronically Signed   By: Elon Alas   On: 06/18/2014 04:41    IMPRESSION:  -GI bleed:  Likely lower bleed.  Suspect diverticular.  Her Hgb seems to fluctuate over time but is down almost 3 grams as compared to 4 months ago. -Abdominal pain:  No source identified on CT scan.  ? Related to the UTI that she is being treated for vs musculoskeletal source. -UTI:  On antibiotics.   PLAN: -Monitor Hgb and transfuse prn. -Nuclear medicine bleeding scan if continues to bleed briskly, otherwise continue observation for now.  Nabilah Davoli D.  06/18/2014, 9:20 AM  Pager number [phone]

## 2014-06-18 NOTE — ED Provider Notes (Signed)
CSN: [id number]     Arrival date & time 06/18/14  0009 History   First MD Initiated Contact with Patient 06/18/14 0036     Chief Complaint  Patient presents with  . Rectal Bleeding     (Consider location/radiation/quality/duration/timing/severity/associated sxs/prior Treatment) HPI Patient presents with 6 bloody bowel movements starting earlier today. She's had mild nausea but without any vomiting. Patient states that fills the toilet. She's had constant right side and flank pain for the past 6 days. This is unchanged. She's had no fever or chills. Denies any urinary symptoms. Patient states she's had a history of hemorrhoid bleeding in the past. Past Medical History  Diagnosis Date  . Hypertension   . Hyperlipidemia   . Morbid obesity   . Osteoarthritis   . Gout   . CVA (cerebral infarction)   . IBS (irritable bowel syndrome)   . Diverticulosis   . Esophageal stricture   . Colon polyps     hyperplastic  . Anxiety   . Hiatal hernia     4 cm  . Shingles 02/21/2011  . Skin cancer   . Breast cancer     right breast  . Dysrhythmia     dr bensimhon   . Blood transfusion   . Chronic kidney disease     occ uti's  . GERD (gastroesophageal reflux disease)   . PONV (postoperative nausea and vomiting)   . DVT of leg (deep venous thrombosis)     left; S/P OR  . Atrial flutter     "sometimes"  . Bronchitis   . Pneumonia     "walking"  . Shortness of breath on exertion   . Anemia   . Stroke 2002    residual:  "little tingling in palm of left hand"  . Kidney stones 1990's  . UTI (lower urinary tract infection)     "I've had a few"  . History of radiation therapy 02/15/12-04/02/12    right breast 4680 cGy/26 sessions, right boost=1400cGy /7 sessions   Past Surgical History  Procedure Laterality Date  . Abdominal hysterectomy    . Bile duct stent placement  ~ 01/2011  . Pelvic bone tumor removal      twice in 2000's  . Colonic perforation repair  ~ 2000  . Total hip  arthroplasty  1980's    right  . Joint replacement    . Colon surgery      hole in intestine ,tumors?  . Tonsillectomy  ~ 1946  . Cholecystectomy  03/23/11    lap chole   . Replacement total knee bilateral  ~ 2008; ~ 2010    right; left  . Cataract extraction w/ intraocular lens  implant, bilateral  1990's  . Hemorrhoid surgery  2012  . Breast lumpectomy  06/29/11    right  . Breast lumpectomy  06/29/2011    Procedure: BREAST LUMPECTOMY WITH EXCISION OF SENTINEL NODE;  Surgeon: Pedro Earls, MD;  Location: [facility];  Service: General;  Laterality: Right;  right sentinel node mapping,right sentinel node biopsy, needle localization right breast lumpectomy  . Incision and drainage breast abscess      right breast seroma  . Incision and drainage abscess N/A 08/12/2013    Procedure: INCISION AND DRAINAGE ABSCESS;  Surgeon: Earnstine Regal, MD;  Location: [facility];  Service: General;  Laterality: N/A;  . Irrigation and debridement abscess N/A 08/14/2013    Procedure: Debridment of perineal tissue and debridement of perineal wound;  Surgeon: Merlinda Frederick  Gerkin, MD;  Location: [facility];  Service: General;  Laterality: N/A;  . Irrigation and debridement abscess N/A 08/16/2013    Procedure: DRESSING CHANGE AND DEBRIDEMENT OF PERINEUM WOUND;  Surgeon: Earnstine Regal, MD;  Location: [facility];  Service: General;  Laterality: N/A;  . Pilonidal cyst drainage N/A 08/19/2013    Procedure: Dressing change perineum;  Surgeon: Ralene Ok, MD;  Location: [facility];  Service: General;  Laterality: N/A;  . Minor application of wound vac N/A 08/20/2013    Procedure: MINOR APPLICATION OF WOUND VAC;  Surgeon: Ralene Ok, MD;  Location: [facility];  Service: General;  Laterality: N/A;  . Incision and drainage abscess N/A 08/20/2013    Procedure: INCISION AND DRAINAGE ABSCESS;  Surgeon: Ralene Ok, MD;  Location: [facility];  Service: General;  Laterality: N/A;   Family History  Problem Relation Age of Onset  . Breast cancer Sister      x 2  . Diabetes Sister   . Prostate cancer Brother   . Colon cancer Neg Hx   . Stroke Mother   . Kidney failure Sister   . Heart disease Paternal Uncle     multiple  . Heart disease Maternal Uncle     multiple   History  Substance Use Topics  . Smoking status: Never Smoker   . Smokeless tobacco: Never Used  . Alcohol Use: No   OB History    No data available     Review of Systems  Constitutional: Negative for fever and chills.  Respiratory: Negative for shortness of breath.   Cardiovascular: Negative for chest pain.  Gastrointestinal: Positive for nausea, abdominal pain and blood in stool. Negative for vomiting and diarrhea.  Genitourinary: Negative for dysuria and flank pain.  Musculoskeletal: Negative for back pain, neck pain and neck stiffness.  Skin: Negative for rash and wound.  Neurological: Negative for dizziness, syncope, weakness, light-headedness, numbness and headaches.  All other systems reviewed and are negative.     Allergies  Ace inhibitors; Atorvastatin; Oxycodone-acetaminophen; Rosuvastatin; Simvastatin; Codeine; and Sulfonamide derivatives  Home Medications   Prior to Admission medications   Medication Sig Start Date End Date Taking? Authorizing Provider  allopurinol (ZYLOPRIM) 100 MG tablet Take 100-200 mg by mouth every other day. Take 100 mg and then 200 mg on alternating days.   Yes Historical Provider, MD  anastrozole (ARIMIDEX) 1 MG tablet Take 1 mg by mouth daily.   Yes Historical Provider, MD  aspirin 325 MG EC tablet Take 325 mg by mouth every other day.   Yes Historical Provider, MD  cholecalciferol (VITAMIN D) 1000 UNITS tablet Take 1,000 Units by mouth every morning.    Yes Historical Provider, MD  furosemide (LASIX) 40 MG tablet Take 1 tablet (40 mg total) by mouth every morning. Start 10/16/13 03/05/14  Yes Biagio Borg, MD  lisinopril (PRINIVIL,ZESTRIL) 20 MG tablet Take 40 mg by mouth daily.   Yes Historical Provider, MD   traMADol-acetaminophen (ULTRACET) 37.5-325 MG per tablet Take 1 tablet by mouth every 6 (six) hours as needed. 02/11/14  Yes Biagio Borg, MD  anastrozole (ARIMIDEX) 1 MG tablet TAKE 1 TABLET BY MOUTH ONCE DAILY Patient not taking: Reported on 06/18/2014 06/10/14   Chauncey Cruel, MD  famotidine (PEPCID) 20 MG tablet Take 1 tablet (20 mg total) by mouth daily. 08/29/13 09/05/13  Kinnie Feil, MD  lisinopril (PRINIVIL,ZESTRIL) 20 MG tablet TAKE TWO TABLETS BY MOUTH ONCE DAILY Patient not taking: Reported on 06/18/2014 12/19/13  Biagio Borg, MD   BP 136/44 mmHg  Pulse 73  Temp(Src) 97.5 F (36.4 C) (Oral)  Resp 15  Ht 5\' 6"  (1.676 m)  Wt 239 lb 3.2 oz (108.5 kg)  BMI 38.63 kg/m2  SpO2 99% Physical Exam  Constitutional: She is oriented to person, place, and time. She appears well-developed and well-nourished. No distress.  HENT:  Head: Normocephalic and atraumatic.  Mouth/Throat: Oropharynx is clear and moist.  Eyes: EOM are normal. Pupils are equal, round, and reactive to light.  Neck: Normal range of motion. Neck supple.  Cardiovascular: Normal rate and regular rhythm.   Pulmonary/Chest: Effort normal and breath sounds normal. No respiratory distress. She has no wheezes. She has no rales. She exhibits no tenderness.  Abdominal: Soft. Bowel sounds are normal. She exhibits no distension and no mass. There is tenderness (Tenderness to palpation in the right upper quadrant and right flank area.). There is no rebound and no guarding.  Genitourinary:  Gross dark red blood on digital rectal exam.  Musculoskeletal: Normal range of motion. She exhibits no edema or tenderness.  Neurological: She is alert and oriented to person, place, and time.  Moves all extremities without deficit. Sensation grossly intact.  Skin: Skin is warm and dry. No rash noted. No erythema.  Psychiatric: She has a normal mood and affect. Her behavior is normal.  Nursing note and vitals reviewed.   ED Course   Procedures (including critical care time) Labs Review Labs Reviewed  CBC WITH DIFFERENTIAL - Abnormal; Notable for the following:    RBC 3.82 (*)    Hemoglobin 10.8 (*)    HCT 35.4 (*)    RDW 16.4 (*)    All other components within normal limits  COMPREHENSIVE METABOLIC PANEL - Abnormal; Notable for the following:    Glucose, Bld 130 (*)    BUN 45 (*)    Creatinine, Ser 1.55 (*)    Albumin 3.4 (*)    GFR calc non Af Amer 30 (*)    GFR calc Af Amer 35 (*)    All other components within normal limits  URINALYSIS, ROUTINE W REFLEX MICROSCOPIC - Abnormal; Notable for the following:    APPearance CLOUDY (*)    Nitrite POSITIVE (*)    Leukocytes, UA MODERATE (*)    All other components within normal limits  URINE MICROSCOPIC-ADD ON - Abnormal; Notable for the following:    Bacteria, UA MANY (*)    All other components within normal limits  CBC - Abnormal; Notable for the following:    RBC 3.39 (*)    Hemoglobin 9.6 (*)    HCT 31.3 (*)    RDW 16.2 (*)    All other components within normal limits  CBC - Abnormal; Notable for the following:    RBC 3.07 (*)    Hemoglobin 8.7 (*)    HCT 28.8 (*)    RDW 16.4 (*)    All other components within normal limits  CBC - Abnormal; Notable for the following:    RBC 2.73 (*)    Hemoglobin 8.0 (*)    HCT 25.7 (*)    RDW 16.6 (*)    All other components within normal limits  HEMOGLOBIN A1C - Abnormal; Notable for the following:    Hgb A1c MFr Bld 6.8 (*)    Mean Plasma Glucose 148 (*)    All other components within normal limits  GLUCOSE, CAPILLARY - Abnormal; Notable for the following:    Glucose-Capillary 124 (*)  All other components within normal limits  GLUCOSE, CAPILLARY - Abnormal; Notable for the following:    Glucose-Capillary 116 (*)    All other components within normal limits  COMPREHENSIVE METABOLIC PANEL - Abnormal; Notable for the following:    Glucose, Bld 116 (*)    BUN 34 (*)    Creatinine, Ser 1.17 (*)    Total  Protein 5.6 (*)    Albumin 2.8 (*)    GFR calc non Af Amer 42 (*)    GFR calc Af Amer 49 (*)    All other components within normal limits  GLUCOSE, CAPILLARY - Abnormal; Notable for the following:    Glucose-Capillary 123 (*)    All other components within normal limits  CBC - Abnormal; Notable for the following:    RBC 2.79 (*)    Hemoglobin 8.1 (*)    HCT 26.5 (*)    RDW 16.7 (*)    All other components within normal limits  GLUCOSE, CAPILLARY - Abnormal; Notable for the following:    Glucose-Capillary 107 (*)    All other components within normal limits  GLUCOSE, CAPILLARY - Abnormal; Notable for the following:    Glucose-Capillary 116 (*)    All other components within normal limits  GLUCOSE, CAPILLARY - Abnormal; Notable for the following:    Glucose-Capillary 121 (*)    All other components within normal limits  POC OCCULT BLOOD, ED - Abnormal; Notable for the following:    Fecal Occult Bld POSITIVE (*)    All other components within normal limits  MRSA PCR SCREENING  URINE CULTURE  CLOSTRIDIUM DIFFICILE BY PCR  URINE CULTURE  LIPASE, BLOOD  TROPONIN I  PROTIME-INR  APTT  OCCULT BLOOD X 1 CARD TO LAB, STOOL  CBC  TYPE AND SCREEN  PREPARE RBC (CROSSMATCH)    Imaging Review Ct Abdomen Pelvis W Contrast  06/18/2014   CLINICAL DATA:  Blood in stools since June 17, 2014. RIGHT abdominal pain. History of breast cancer, colon surgery, his cholecystectomy, hysterectomy. Fall from stool June 15, 2014. Back pain.  EXAM: CT ABDOMEN AND PELVIS WITH CONTRAST  TECHNIQUE: Multidetector CT imaging of the abdomen and pelvis was performed using the standard protocol following bolus administration of intravenous contrast.  CONTRAST:  19mL OMNIPAQUE IOHEXOL 300 MG/ML  SOLN  COMPARISON:  CT of the abdomen and pelvis October 10, 2013  FINDINGS: LUNG BASES: Included view of the lung bases are clear. Visualized heart and pericardium are unremarkable.  SOLID ORGANS: Calcified splenic  granulomas. Pneumobilia, status post cholecystectomy. Hepatic granuloma. Pancreas, adrenal glands are nonsuspicious.  GASTROINTESTINAL TRACT: The stomach, small and large bowel are normal in course and caliber without inflammatory changes. Moderate amount of retained large bowel stool. Moderate colonic diverticulosis. Normal appendix.  KIDNEYS/ URINARY TRACT: Kidneys are orthotopic, demonstrating symmetric enhancement. Mildly atrophic kidneys. No nephrolithiasis, hydronephrosis or solid renal masses. The unopacified ureters are normal in course and caliber. Delayed imaging through the kidneys demonstrates symmetric prompt contrast excretion within the proximal urinary collecting system. Urinary bladder is partially distended and unremarkable.  PERITONEUM/RETROPERITONEUM: No intraperitoneal free fluid nor free air. Aortoiliac vessels are normal in course and caliber, moderate calcific atherosclerosis. No lymphadenopathy by CT size criteria. Status post hysterectomy.  SOFT TISSUE/OSSEOUS STRUCTURES: Nonsuspicious. RIGHT hip total arthroplasty. Remote LEFT L3 transverse process fracture. Moderate to severe degenerative change of the lower lumbar spine. Anterior abdominal wall scarring.  IMPRESSION: No acute intra-abdominal or pelvic process.  Diverticulosis without CT findings of acute diverticulitis. Moderate  amount of retained large bowel stool.  No acute osseous process though, not tailored for evaluation.   Electronically Signed   By: Elon Alas   On: 06/18/2014 04:41     EKG Interpretation None      MDM   Final diagnoses:  Abdominal pain  Lower GI bleed  UTI (lower urinary tract infection)    Discussed with Dr. Deatra Ina. Will see patient in the morning.  [facility] will admit. Patient's vital signs remained stable emergency department.  Julianne Rice, MD 06/19/14 [phone]

## 2014-06-18 NOTE — Progress Notes (Signed)
[facility] PROGRESS NOTE  JAXSYN ERBES [id number] DOB: 31-Jul-1929 DOA: 06/18/2014 PCP: [name], MD Interim summary: 81 y.o. year old female with prior h/o chronic diastolic heart failure, comes in for right sided abdominal pain, associated with 3 bloody bowel movements. CT showed diverticulosis. She was admitted to medical service for evaluation of diverticular bleed.  Assessment/Plan: 1. Diverticular bleed: -admitted to step down. Serial blood draws to check hemoglobin. IV PPI, NPO and IV fluids. GI CONSULTED and recommendations given.   Urinary tract infection: - on rocephin. Urine cultures are pending.   DVT prophylaxis.   Code Status: full code.  Family Communication: none at bedside.  Disposition Plan: remain in step down.    Consultants:  Gastroenterology consult.  Procedures:  none  Antibiotics:  Rocephin   HPI/Subjective: No new complaints.   Objective: Filed Vitals:   06/18/14 0900  BP: 145/36  Pulse: 83  Temp:   Resp: 15    Intake/Output Summary (Last 24 hours) at 06/18/14 0915 Last data filed at 06/18/14 0616  Gross per 24 hour  Intake     75 ml  Output    100 ml  Net    -25 ml   Filed Weights   06/18/14 0014 06/18/14 0600  Weight: 99.791 kg (220 lb) 108.5 kg (239 lb 3.2 oz)    Exam:   General:  Alert afebrile comfortable.  Cardiovascular: s1s2  Respiratory: ctab  Abdomen: soft non tender non distended bowel sounds heard.   Musculoskeletal: no pedal edema.   Data Reviewed: Basic Metabolic Panel:  Recent Labs Lab 06/18/14 0113  NA 140  K 4.3  CL 107  CO2 27  GLUCOSE 130*  BUN 45*  CREATININE 1.55*  CALCIUM 8.9   Liver Function Tests:  Recent Labs Lab 06/18/14 0113  AST 24  ALT 16  ALKPHOS 86  BILITOT 0.9  PROT 6.6  ALBUMIN 3.4*    Recent Labs Lab 06/18/14 0113  LIPASE 27   No results for input(s): AMMONIA in the last 168 hours. CBC:  Recent Labs Lab 06/18/14 0113 06/18/14 0551  WBC  10.4 9.7  NEUTROABS 7.2  --   HGB 10.8* 9.6*  HCT 35.4* 31.3*  MCV 92.7 92.3  PLT 195 191   Cardiac Enzymes:  Recent Labs Lab 06/18/14 0113  TROPONINI <0.03   BNP (last 3 results)  Recent Labs  08/17/13 0528 08/26/13 0547  PROBNP 10639.0* 9195.0*   CBG:  Recent Labs Lab 06/18/14 0802  GLUCAP 124*    No results found for this or any previous visit (from the past 240 hour(s)).   Studies: Ct Abdomen Pelvis W Contrast  06/18/2014   CLINICAL DATA:  Blood in stools since June 17, 2014. RIGHT abdominal pain. History of breast cancer, colon surgery, his cholecystectomy, hysterectomy. Fall from stool June 15, 2014. Back pain.  EXAM: CT ABDOMEN AND PELVIS WITH CONTRAST  TECHNIQUE: Multidetector CT imaging of the abdomen and pelvis was performed using the standard protocol following bolus administration of intravenous contrast.  CONTRAST:  8mL OMNIPAQUE IOHEXOL 300 MG/ML  SOLN  COMPARISON:  CT of the abdomen and pelvis October 10, 2013  FINDINGS: LUNG BASES: Included view of the lung bases are clear. Visualized heart and pericardium are unremarkable.  SOLID ORGANS: Calcified splenic granulomas. Pneumobilia, status post cholecystectomy. Hepatic granuloma. Pancreas, adrenal glands are nonsuspicious.  GASTROINTESTINAL TRACT: The stomach, small and large bowel are normal in course and caliber without inflammatory changes. Moderate amount of retained large bowel stool. Moderate  colonic diverticulosis. Normal appendix.  KIDNEYS/ URINARY TRACT: Kidneys are orthotopic, demonstrating symmetric enhancement. Mildly atrophic kidneys. No nephrolithiasis, hydronephrosis or solid renal masses. The unopacified ureters are normal in course and caliber. Delayed imaging through the kidneys demonstrates symmetric prompt contrast excretion within the proximal urinary collecting system. Urinary bladder is partially distended and unremarkable.  PERITONEUM/RETROPERITONEUM: No intraperitoneal free fluid nor free  air. Aortoiliac vessels are normal in course and caliber, moderate calcific atherosclerosis. No lymphadenopathy by CT size criteria. Status post hysterectomy.  SOFT TISSUE/OSSEOUS STRUCTURES: Nonsuspicious. RIGHT hip total arthroplasty. Remote LEFT L3 transverse process fracture. Moderate to severe degenerative change of the lower lumbar spine. Anterior abdominal wall scarring.  IMPRESSION: No acute intra-abdominal or pelvic process.  Diverticulosis without CT findings of acute diverticulitis. Moderate amount of retained large bowel stool.  No acute osseous process though, not tailored for evaluation.   Electronically Signed   By: Elon Alas   On: 06/18/2014 04:41    Scheduled Meds: . sodium chloride   Intravenous Once  . cefTRIAXone (ROCEPHIN)  IV  1 g Intravenous Q24H  . insulin aspart  0-9 Units Subcutaneous 6 times per day  . pantoprazole (PROTONIX) IV  40 mg Intravenous Q24H   Continuous Infusions: . sodium chloride 75 mL/hr at 06/18/14 [id number]    Active Problems:   GIB (gastrointestinal bleeding)   UTI (lower urinary tract infection)    Time spent: 25 min    Deeanne Deininger  [facility] Pager [phone] If 7PM-7AM, please contact night-coverage at [email or web address], password [facility] 06/18/2014, 9:15 AM  LOS: 0 days

## 2014-06-18 NOTE — ED Notes (Signed)
Patient has had her third big dark red bloody bowel movement. Changed patients incont pads. Tolerated it well.

## 2014-06-18 NOTE — H&P (Addendum)
Hospitalist Admission History and Physical  Patient name: [name] Medical record number: [id number] Date of birth: 14-Nov-1929 Age: 81 y.o. Gender: female  Primary Care Provider: [name], MD  Chief Complaint: GIB, UTI  History of Present Illness:This is a 80 y.o. year old female with significant past medical history of CVA, HTN, diverticular disease, chronic diastolic heart failure, stage 3 CKDn  Hx/o breast ca, presenting w/ GIB, UTI. Pt reports maroon colored stools over past day in conjunction w/ R sided abd pain over past 4 days. Does report having a mechanical fall prior to sxs. No fevers or chills. No vomiting. Mild nausea. On full dose ASA for prior CVA. Denies any OTC NSAID use. Had similar episode 10/2013 thought to be related to hemorrhoidal disease.  Presents to [facility] ER afebrile, hemodynamically stable. BPs  140s-160s. Satting 95% on RA. WBC 10.4, hgb 10.8, Cr 1.55, trop WNL. UA posititve for infection. Has had 3-4 large bloody bowel movements in ER.  Hemoccult positive. CT abd and pelvis done in ER showing no acute findings, diverticulosis w/o diverticulitis. Moderate amount of retained stool. Pt type and screened. Per EDP, [facility] aware of case.   Assessment and Plan: [name] is a 81 y.o. year old female presenting with GIB, UTI   Active Problems:   GIB (gastrointestinal bleeding)  1- GIB  -no acute findings on CT abd  -baseline diverticular disease at baseline-+ RF  -serial CBCs -pt type and screened -hold offending agents  -PPI given ASA use  -[facility] aware of case per reports-f/u recs  -noted hx/o Cdiff-recheck    2-UTI  -Urine culture -Rocephin   3- hx/o CVA -asymtpomatic currently  -hold ASA given above  -may need to be transitioned to baby ASA w/ PPI long term   4- HTN -BP stable  -hold oral regimen given above  -titrate as clinically indicated   5- hx/o breast CA -hold arimidex in the interim  6- CKD -at baseline -follow   7-  hx/o DM  -seemingly diet controlled -not on oral regimen -SSI, A1C  FEN/GI: NPO for now  Prophylaxis: SCDs  Disposition: pending further evaluation  Code Status:Full Code    Patient Active Problem List   Diagnosis Date Noted  . GIB (gastrointestinal bleeding) 06/18/2014  . Arthritis 02/18/2014  . Enteritis due to Clostridium difficile 10/12/2013  . CKD (chronic kidney disease) stage 3, GFR 30-59 ml/min 10/11/2013  . Chronic diastolic CHF (congestive heart failure) 10/11/2013  . Protein-calorie malnutrition, severe 10/11/2013  . Hx of necrotizing fasciitis 10/10/2013  . GI bleed 10/10/2013  . AKI (acute kidney injury) 10/10/2013  . Rectal bleeding 09/10/2013  . Acute on chronic diastolic CHF (congestive heart failure) 08/31/2013  . Rash and nonspecific skin eruption 08/27/2013  . Pulmonary edema 08/25/2013  . Severe sepsis with septic shock 08/13/2013  . Necrotizing fasciitis of perineum 08/12/2013  . Acute on chronic kidney disease, stage 3 08/12/2013  . Wound healing, delayed 01/26/2012  . Diverticulosis of colon with hemorrhage 12/19/2011  . Acute posthemorrhagic anemia 12/19/2011  . Cancer of upper-outer quadrant of female breast 08/01/2011  . Breast cancer, right breast-lobular s/p lumpectomy and sentinel node biopsy 06/30/2011  . Shingles 02/21/2011  . Bile duct calculus with nonacute cholecystitis 01/21/2011  . Morbid obesity 01/21/2011  . TREMOR 08/17/2010  . DIABETES MELLITUS, TYPE II 01/09/2008  . PERIPHERAL NEUROPATHY 10/17/2007  . FATIGUE 10/17/2007  . COLONIC POLYPS, HX OF 02/12/2007  . HYPERLIPIDEMIA 02/10/2007  . GOUT 02/10/2007  .  ANXIETY 02/10/2007  . HYPERTENSION 02/10/2007  . CVA 02/10/2007  . DIVERTICULOSIS, COLON 02/10/2007  . IRRITABLE BOWEL, PREDOMINANTLY CONSTIPATION 02/10/2007   Past Medical History: Past Medical History  Diagnosis Date  . Hypertension   . Hyperlipidemia   . Morbid obesity   . Osteoarthritis   . Gout   . CVA (cerebral  infarction)   . IBS (irritable bowel syndrome)   . Diverticulosis   . Esophageal stricture   . Colon polyps     hyperplastic  . Anxiety   . Hiatal hernia     4 cm  . Shingles 02/21/2011  . Skin cancer   . Breast cancer     right breast  . Dysrhythmia     dr bensimhon   . Blood transfusion   . Chronic kidney disease     occ uti's  . GERD (gastroesophageal reflux disease)   . PONV (postoperative nausea and vomiting)   . DVT of leg (deep venous thrombosis)     left; S/P OR  . Atrial flutter     "sometimes"  . Bronchitis   . Pneumonia     "walking"  . Shortness of breath on exertion   . Anemia   . Stroke 2002    residual:  "little tingling in palm of left hand"  . Kidney stones 1990's  . UTI (lower urinary tract infection)     "I've had a few"  . History of radiation therapy 02/15/12-04/02/12    right breast 4680 cGy/26 sessions, right boost=1400cGy /7 sessions    Past Surgical History: Past Surgical History  Procedure Laterality Date  . Abdominal hysterectomy    . Bile duct stent placement  ~ 01/2011  . Pelvic bone tumor removal      twice in 2000's  . Colonic perforation repair  ~ 2000  . Total hip arthroplasty  1980's    right  . Joint replacement    . Colon surgery      hole in intestine ,tumors?  . Tonsillectomy  ~ 1946  . Cholecystectomy  03/23/11    lap chole   . Replacement total knee bilateral  ~ 2008; ~ 2010    right; left  . Cataract extraction w/ intraocular lens  implant, bilateral  1990's  . Hemorrhoid surgery  2012  . Breast lumpectomy  06/29/11    right  . Breast lumpectomy  06/29/2011    Procedure: BREAST LUMPECTOMY WITH EXCISION OF SENTINEL NODE;  Surgeon: Pedro Earls, MD;  Location: [place];  Service: General;  Laterality: Right;  right sentinel node mapping,right sentinel node biopsy, needle localization right breast lumpectomy  . Incision and drainage breast abscess      right breast seroma  . Incision and drainage abscess N/A 08/12/2013     Procedure: INCISION AND DRAINAGE ABSCESS;  Surgeon: Earnstine Regal, MD;  Location: [facility];  Service: General;  Laterality: N/A;  . Irrigation and debridement abscess N/A 08/14/2013    Procedure: Debridment of perineal tissue and debridement of perineal wound;  Surgeon: Earnstine Regal, MD;  Location: [facility];  Service: General;  Laterality: N/A;  . Irrigation and debridement abscess N/A 08/16/2013    Procedure: DRESSING CHANGE AND DEBRIDEMENT OF PERINEUM WOUND;  Surgeon: Earnstine Regal, MD;  Location: [facility];  Service: General;  Laterality: N/A;  . Pilonidal cyst drainage N/A 08/19/2013    Procedure: Dressing change perineum;  Surgeon: Ralene Ok, MD;  Location: [facility];  Service: General;  Laterality: N/A;  .  Minor application of wound vac N/A 08/20/2013    Procedure: MINOR APPLICATION OF WOUND VAC;  Surgeon: Ralene Ok, MD;  Location: [facility];  Service: General;  Laterality: N/A;  . Incision and drainage abscess N/A 08/20/2013    Procedure: INCISION AND DRAINAGE ABSCESS;  Surgeon: Ralene Ok, MD;  Location: [facility];  Service: General;  Laterality: N/A;    Social History: History   Social History  . Marital Status: Married    Spouse Name: N/A    Number of Children: N/A  . Years of Education: N/A   Occupational History  . hairdresser    Social History Main Topics  . Smoking status: Never Smoker   . Smokeless tobacco: Never Used  . Alcohol Use: No  . Drug Use: No  . Sexual Activity: No   Other Topics Concern  . None   Social History Narrative   Lives at home with her husband.  Uses a walker for ambulation    Family History: Family History  Problem Relation Age of Onset  . Breast cancer Sister     x 2  . Diabetes Sister   . Prostate cancer Brother   . Colon cancer Neg Hx   . Stroke Mother   . Kidney failure Sister   . Heart disease Paternal Uncle     multiple  . Heart disease Maternal Uncle     multiple    Allergies: Allergies  Allergen Reactions  . Ace  Inhibitors Other (See Comments)    Tolerates lisinopril at home. Pt does not recall allergy.  . Atorvastatin     REACTION: myalgias  . Oxycodone-Acetaminophen     REACTION: confusion, fatigue  . Rosuvastatin     REACTION: leg weakness  . Simvastatin     REACTION: leg weakness  . Codeine Nausea And Vomiting and Rash  . Sulfonamide Derivatives Hives and Rash    Current Facility-Administered Medications  Medication Dose Route Frequency Provider Last Rate Last Dose  . 0.9 %  sodium chloride infusion   Intravenous Continuous Shanda Howells, MD       Current Outpatient Prescriptions  Medication Sig Dispense Refill  . allopurinol (ZYLOPRIM) 100 MG tablet Take 100-200 mg by mouth every other day. Take 100 mg and then 200 mg on alternating days.    Marland Kitchen anastrozole (ARIMIDEX) 1 MG tablet Take 1 mg by mouth daily.    Marland Kitchen aspirin 325 MG EC tablet Take 325 mg by mouth every other day.    . cholecalciferol (VITAMIN D) 1000 UNITS tablet Take 1,000 Units by mouth every morning.     . furosemide (LASIX) 40 MG tablet Take 1 tablet (40 mg total) by mouth every morning. Start 10/16/13 30 tablet 6  . lisinopril (PRINIVIL,ZESTRIL) 20 MG tablet Take 40 mg by mouth daily.    . traMADol-acetaminophen (ULTRACET) 37.5-325 MG per tablet Take 1 tablet by mouth every 6 (six) hours as needed. 120 tablet 1  . anastrozole (ARIMIDEX) 1 MG tablet TAKE 1 TABLET BY MOUTH ONCE DAILY (Patient not taking: Reported on 06/18/2014) 90 tablet 2  . famotidine (PEPCID) 20 MG tablet Take 1 tablet (20 mg total) by mouth daily.    Marland Kitchen lisinopril (PRINIVIL,ZESTRIL) 20 MG tablet TAKE TWO TABLETS BY MOUTH ONCE DAILY (Patient not taking: Reported on 06/18/2014) 180 tablet 3   Review Of Systems: 12 point ROS negative except as noted above in HPI.  Physical Exam: Filed Vitals:   06/18/14 0502  BP: 153/69  Pulse: 88  Temp: 98.4  F (36.9 C)  Resp: 15    General: alert and cooperative HEENT: PERRLA and extra ocular movement intact Heart:  S1, S2 normal, no murmur, rub or gallop, regular rate and rhythm Lungs: clear to auscultation, no wheezes or rales and unlabored breathing Abdomen: + bowel sounds, mild R sided TTP Extremities: extremities normal, atraumatic, no cyanosis or edema Skin:no rashes, no ecchymoses Neurology: normal without focal findings  Labs and Imaging: Lab Results  Component Value Date/Time   NA 140 06/18/2014 01:13 AM   NA 144 02/18/2014 02:45 PM   K 4.3 06/18/2014 01:13 AM   K 4.3 02/18/2014 02:45 PM   CL 107 06/18/2014 01:13 AM   CO2 27 06/18/2014 01:13 AM   CO2 29 02/18/2014 02:45 PM   BUN 45* 06/18/2014 01:13 AM   BUN 35.4* 02/18/2014 02:45 PM   CREATININE 1.55* 06/18/2014 01:13 AM   CREATININE 1.3* 02/18/2014 02:45 PM   GLUCOSE 130* 06/18/2014 01:13 AM   GLUCOSE 166* 02/18/2014 02:45 PM   Lab Results  Component Value Date   WBC 10.4 06/18/2014   HGB 10.8* 06/18/2014   HCT 35.4* 06/18/2014   MCV 92.7 06/18/2014   PLT 195 06/18/2014   Urinalysis    Component Value Date/Time   COLORURINE YELLOW 06/18/2014 0334   APPEARANCEUR CLOUDY* 06/18/2014 0334   LABSPEC 1.018 06/18/2014 0334   PHURINE 5.5 06/18/2014 0334   GLUCOSEU NEGATIVE 06/18/2014 0334   GLUCOSEU NEGATIVE 08/17/2010 [place] 06/18/2014 0334   BILIRUBINUR NEGATIVE 06/18/2014 0334   KETONESUR NEGATIVE 06/18/2014 0334   PROTEINUR NEGATIVE 06/18/2014 0334   UROBILINOGEN 0.2 06/18/2014 0334   NITRITE POSITIVE* 06/18/2014 0334   LEUKOCYTESUR MODERATE* 06/18/2014 0334       Ct Abdomen Pelvis W Contrast  06/18/2014   CLINICAL DATA:  Blood in stools since June 17, 2014. RIGHT abdominal pain. History of breast cancer, colon surgery, his cholecystectomy, hysterectomy. Fall from stool June 15, 2014. Back pain.  EXAM: CT ABDOMEN AND PELVIS WITH CONTRAST  TECHNIQUE: Multidetector CT imaging of the abdomen and pelvis was performed using the standard protocol following bolus administration of intravenous contrast.   CONTRAST:  74mL OMNIPAQUE IOHEXOL 300 MG/ML  SOLN  COMPARISON:  CT of the abdomen and pelvis October 10, 2013  FINDINGS: LUNG BASES: Included view of the lung bases are clear. Visualized heart and pericardium are unremarkable.  SOLID ORGANS: Calcified splenic granulomas. Pneumobilia, status post cholecystectomy. Hepatic granuloma. Pancreas, adrenal glands are nonsuspicious.  GASTROINTESTINAL TRACT: The stomach, small and large bowel are normal in course and caliber without inflammatory changes. Moderate amount of retained large bowel stool. Moderate colonic diverticulosis. Normal appendix.  KIDNEYS/ URINARY TRACT: Kidneys are orthotopic, demonstrating symmetric enhancement. Mildly atrophic kidneys. No nephrolithiasis, hydronephrosis or solid renal masses. The unopacified ureters are normal in course and caliber. Delayed imaging through the kidneys demonstrates symmetric prompt contrast excretion within the proximal urinary collecting system. Urinary bladder is partially distended and unremarkable.  PERITONEUM/RETROPERITONEUM: No intraperitoneal free fluid nor free air. Aortoiliac vessels are normal in course and caliber, moderate calcific atherosclerosis. No lymphadenopathy by CT size criteria. Status post hysterectomy.  SOFT TISSUE/OSSEOUS STRUCTURES: Nonsuspicious. RIGHT hip total arthroplasty. Remote LEFT L3 transverse process fracture. Moderate to severe degenerative change of the lower lumbar spine. Anterior abdominal wall scarring.  IMPRESSION: No acute intra-abdominal or pelvic process.  Diverticulosis without CT findings of acute diverticulitis. Moderate amount of retained large bowel stool.  No acute osseous process though, not tailored for evaluation.   Electronically  Signed   By: Elon Alas   On: 06/18/2014 04:41           Shanda Howells MD  Pager: [phone]

## 2014-06-19 DIAGNOSIS — R1031 Right lower quadrant pain: Secondary | ICD-10-CM

## 2014-06-19 DIAGNOSIS — K922 Gastrointestinal hemorrhage, unspecified: Secondary | ICD-10-CM

## 2014-06-19 LAB — CBC
HCT: 26.5 % — ABNORMAL LOW (ref 36.0–46.0)
HCT: 24.7 % — ABNORMAL LOW (ref 36.0–46.0)
Hemoglobin: 8.1 g/dL — ABNORMAL LOW (ref 12.0–15.0)
Hemoglobin: 7.7 g/dL — ABNORMAL LOW (ref 12.0–15.0)
MCH: 29 pg (ref 26.0–34.0)
MCH: 29.3 pg (ref 26.0–34.0)
MCHC: 30.6 g/dL (ref 30.0–36.0)
MCHC: 31.2 g/dL (ref 30.0–36.0)
MCV: 95 fL (ref 78.0–100.0)
MCV: 93.9 fL (ref 78.0–100.0)
Platelets: 192 10*3/uL (ref 150–400)
Platelets: 179 10*3/uL (ref 150–400)
RBC: 2.79 MIL/uL — ABNORMAL LOW (ref 3.87–5.11)
RBC: 2.63 MIL/uL — ABNORMAL LOW (ref 3.87–5.11)
RDW: 16.7 % — ABNORMAL HIGH (ref 11.5–15.5)
RDW: 16.7 % — ABNORMAL HIGH (ref 11.5–15.5)
WBC: 8.1 10*3/uL (ref 4.0–10.5)
WBC: 7.6 10*3/uL (ref 4.0–10.5)

## 2014-06-19 LAB — GLUCOSE, CAPILLARY
Glucose-Capillary: 108 mg/dL — ABNORMAL HIGH (ref 70–99)
Glucose-Capillary: 110 mg/dL — ABNORMAL HIGH (ref 70–99)
Glucose-Capillary: 121 mg/dL — ABNORMAL HIGH (ref 70–99)
Glucose-Capillary: 130 mg/dL — ABNORMAL HIGH (ref 70–99)
Glucose-Capillary: 155 mg/dL — ABNORMAL HIGH (ref 70–99)
Glucose-Capillary: 157 mg/dL — ABNORMAL HIGH (ref 70–99)

## 2014-06-19 LAB — COMPREHENSIVE METABOLIC PANEL
ALT: 13 U/L (ref 0–35)
AST: 22 U/L (ref 0–37)
Albumin: 2.8 g/dL — ABNORMAL LOW (ref 3.5–5.2)
Alkaline Phosphatase: 66 U/L (ref 39–117)
Anion gap: 5 (ref 5–15)
BUN: 34 mg/dL — ABNORMAL HIGH (ref 6–23)
CO2: 28 mmol/L (ref 19–32)
Calcium: 8.7 mg/dL (ref 8.4–10.5)
Chloride: 109 mEq/L (ref 96–112)
Creatinine, Ser: 1.17 mg/dL — ABNORMAL HIGH (ref 0.50–1.10)
GFR calc Af Amer: 49 mL/min — ABNORMAL LOW (ref >90)
GFR calc non Af Amer: 42 mL/min — ABNORMAL LOW (ref >90)
Glucose, Bld: 116 mg/dL — ABNORMAL HIGH (ref 70–99)
Potassium: 3.9 mmol/L (ref 3.5–5.1)
Sodium: 142 mmol/L (ref 135–145)
Total Bilirubin: 0.7 mg/dL (ref 0.3–1.2)
Total Protein: 5.6 g/dL — ABNORMAL LOW (ref 6.0–8.3)

## 2014-06-19 LAB — CLOSTRIDIUM DIFFICILE BY PCR: Toxigenic C. Difficile by PCR: NEGATIVE

## 2014-06-19 MED ORDER — PANTOPRAZOLE SODIUM 40 MG PO TBEC
40.0000 mg | DELAYED_RELEASE_TABLET | Freq: Every day | ORAL | Status: DC
  Administered 2014-06-20 – 2014-06-23 (×4): 40 mg via ORAL
  Filled 2014-06-19 (×5): qty 1

## 2014-06-19 NOTE — Progress Notes (Signed)
[facility] PROGRESS NOTE  ALAJIA TAIT [id number] DOB: 30-Apr-1930 DOA: 06/18/2014 PCP: [name], MD Interim summary: 81 y.o. year old female with prior h/o chronic diastolic heart failure, comes in for right sided abdominal pain, associated with 3 bloody bowel movements. CT showed diverticulosis. She was admitted to medical service for evaluation of diverticular bleed.  Assessment/Plan: Diverticular bleed: -admitted to step down. Serial blood draws to check hemoglobin have been stable. Started on clears and will advance as tolerated. Change to PO PPI. GI CONSULTED and recommendations given.   Urinary tract infection: - on rocephin. Urine cultures are pending.  Anemia of blood loss from diverticular bleed: Stable from the last 24 hours. Transfuse to keep hemoglobin greater than 7.   CKD stage 2:  Appears to be stable. Continue to monitor.    DVT prophylaxis.   Code Status: full code.  Family Communication: family at bedside.  Disposition Plan: transfer to med surg bed.    Consultants:  Gastroenterology consult.  Procedures:  none  Antibiotics:  Rocephin   HPI/Subjective One bowel movement with clots.   Objective: Filed Vitals:   06/19/14 0900  BP: 143/46  Pulse: 93  Temp:   Resp: 16    Intake/Output Summary (Last 24 hours) at 06/19/14 1038 Last data filed at 06/19/14 0900  Gross per 24 hour  Intake   1290 ml  Output    800 ml  Net    490 ml   Filed Weights   06/18/14 0014 06/18/14 0600  Weight: 99.791 kg (220 lb) 108.5 kg (239 lb 3.2 oz)    Exam:   General:  Alert afebrile comfortable.  Cardiovascular: s1s2  Respiratory: ctab  Abdomen: soft non tender non distended bowel sounds heard.   Musculoskeletal:trace pedal edema.   Data Reviewed: Basic Metabolic Panel:  Recent Labs Lab 06/18/14 0113 06/19/14 0510  NA 140 142  K 4.3 3.9  CL 107 109  CO2 27 28  GLUCOSE 130* 116*  BUN 45* 34*  CREATININE 1.55* 1.17*  CALCIUM 8.9  8.7   Liver Function Tests:  Recent Labs Lab 06/18/14 0113 06/19/14 0510  AST 24 22  ALT 16 13  ALKPHOS 86 66  BILITOT 0.9 0.7  PROT 6.6 5.6*  ALBUMIN 3.4* 2.8*    Recent Labs Lab 06/18/14 0113  LIPASE 27   No results for input(s): AMMONIA in the last 168 hours. CBC:  Recent Labs Lab 06/18/14 0113 06/18/14 0551 06/18/14 1335 06/18/14 2110 06/19/14 0510  WBC 10.4 9.7 7.9 7.7 8.1  NEUTROABS 7.2  --   --   --   --   HGB 10.8* 9.6* 8.7* 8.0* 8.1*  HCT 35.4* 31.3* 28.8* 25.7* 26.5*  MCV 92.7 92.3 93.8 94.1 95.0  PLT 195 191 198 183 192   Cardiac Enzymes:  Recent Labs Lab 06/18/14 0113  TROPONINI <0.03   BNP (last 3 results)  Recent Labs  08/17/13 0528 08/26/13 0547  PROBNP 10639.0* 9195.0*   CBG:  Recent Labs Lab 06/18/14 1713 06/18/14 1936 06/18/14 2327 06/19/14 0321 06/19/14 0727  GLUCAP 123* 107* 116* 121* 108*    Recent Results (from the past 240 hour(s))  MRSA PCR Screening     Status: None   Collection Time: 06/18/14  6:45 AM  Result Value Ref Range Status   MRSA by PCR NEGATIVE NEGATIVE Final    Comment:        The GeneXpert MRSA Assay (FDA approved for NASAL specimens only), is one component of a comprehensive  MRSA colonization surveillance program. It is not intended to diagnose MRSA infection nor to guide or monitor treatment for MRSA infections.   Clostridium Difficile by PCR     Status: None   Collection Time: 06/18/14  8:00 PM  Result Value Ref Range Status   C difficile by pcr NEGATIVE NEGATIVE Final    Comment: Performed at [facility]     Studies: Ct Abdomen Pelvis W Contrast  06/18/2014   CLINICAL DATA:  Blood in stools since June 17, 2014. RIGHT abdominal pain. History of breast cancer, colon surgery, his cholecystectomy, hysterectomy. Fall from stool June 15, 2014. Back pain.  EXAM: CT ABDOMEN AND PELVIS WITH CONTRAST  TECHNIQUE: Multidetector CT imaging of the abdomen and pelvis was performed using the  standard protocol following bolus administration of intravenous contrast.  CONTRAST:  66mL OMNIPAQUE IOHEXOL 300 MG/ML  SOLN  COMPARISON:  CT of the abdomen and pelvis October 10, 2013  FINDINGS: LUNG BASES: Included view of the lung bases are clear. Visualized heart and pericardium are unremarkable.  SOLID ORGANS: Calcified splenic granulomas. Pneumobilia, status post cholecystectomy. Hepatic granuloma. Pancreas, adrenal glands are nonsuspicious.  GASTROINTESTINAL TRACT: The stomach, small and large bowel are normal in course and caliber without inflammatory changes. Moderate amount of retained large bowel stool. Moderate colonic diverticulosis. Normal appendix.  KIDNEYS/ URINARY TRACT: Kidneys are orthotopic, demonstrating symmetric enhancement. Mildly atrophic kidneys. No nephrolithiasis, hydronephrosis or solid renal masses. The unopacified ureters are normal in course and caliber. Delayed imaging through the kidneys demonstrates symmetric prompt contrast excretion within the proximal urinary collecting system. Urinary bladder is partially distended and unremarkable.  PERITONEUM/RETROPERITONEUM: No intraperitoneal free fluid nor free air. Aortoiliac vessels are normal in course and caliber, moderate calcific atherosclerosis. No lymphadenopathy by CT size criteria. Status post hysterectomy.  SOFT TISSUE/OSSEOUS STRUCTURES: Nonsuspicious. RIGHT hip total arthroplasty. Remote LEFT L3 transverse process fracture. Moderate to severe degenerative change of the lower lumbar spine. Anterior abdominal wall scarring.  IMPRESSION: No acute intra-abdominal or pelvic process.  Diverticulosis without CT findings of acute diverticulitis. Moderate amount of retained large bowel stool.  No acute osseous process though, not tailored for evaluation.   Electronically Signed   By: Elon Alas   On: 06/18/2014 04:41    Scheduled Meds: . sodium chloride   Intravenous Once  . antiseptic oral rinse  7 mL Mouth Rinse q12n4p  .  cefTRIAXone (ROCEPHIN)  IV  1 g Intravenous Q24H  . chlorhexidine  15 mL Mouth Rinse BID  . insulin aspart  0-9 Units Subcutaneous 6 times per day  . pantoprazole (PROTONIX) IV  40 mg Intravenous Q24H   Continuous Infusions: . sodium chloride 75 mL/hr at 06/19/14 0000    Active Problems:   GIB (gastrointestinal bleeding)   UTI (lower urinary tract infection)   Abdominal pain   Lower GI bleed    Time spent: 25 min    [place] Pager [phone] If 7PM-7AM, please contact night-coverage at [email or web address], password [facility] 06/19/2014, 10:38 AM  LOS: 1 day

## 2014-06-19 NOTE — Care Management Note (Addendum)
    Page 1 of 1   06/23/2014     3:04:14 PM CARE MANAGEMENT NOTE 06/23/2014  Patient:  [name],[name]   Account Number:  [id number]  Date Initiated:  06/19/2014  Documentation initiated by:  Sunday Spillers  Subjective/Objective Assessment:   81 yo female admitted with lower GI bleed. PTA lived at home with spouse.     Action/Plan:   Home when stable   Anticipated DC Date:  06/23/2014   Anticipated DC Plan:  [place]  CM consult      [facility] Choice  HOME HEALTH   Choice offered to / List presented to:  C-1 Patient        [place] arranged  HH-1 RN  [place]   Status of service:  Completed, signed off Medicare Important Message given?  YES (If response is "NO", the following Medicare IM given date fields will be blank) Date Medicare IM given:  06/23/2014 Medicare IM given by:  [facility] Date Additional Medicare IM given:   Additional Medicare IM given by:    Discharge Disposition:  [place]  Per UR Regulation:  Reviewed for med. necessity/level of care/duration of stay  If discussed at [place] of Stay Meetings, dates discussed:    Comments:  06-23-13 [place] with patient and husband at bedside. Patient thinks she used [facility] in the past. Contacted [facility] and she has been connected with them. Faxed information to [facility]. D/c summary not available.

## 2014-06-19 NOTE — Progress Notes (Signed)
Following labs trending down HGB and HCT. , Schorr ,NP notified. Orders received.

## 2014-06-19 NOTE — Progress Notes (Signed)
    Progress Note   Subjective  feels okay, no significant abdominal pain. Passed some "old blood clots" this am   Objective   Vital signs in last 24 hours: Temp:  [97.5 F (36.4 C)-97.8 F (36.6 C)] 97.8 F (36.6 C) (01/07 0800) Pulse Rate:  [72-93] 93 (01/07 0900) Resp:  [14-19] 16 (01/07 0900) BP: (93-149)/(34-97) 143/46 mmHg (01/07 0900) SpO2:  [87 %-100 %] 100 % (01/07 0900) Last BM Date: 06/18/14 General:    Pleasant white female in NAD Heart:  Regular rate and rhythm Abdomen:  Soft, nontender and nondistended. Normal bowel sounds. Extremities:  Trace BLE edema Neurologic:  Alert and oriented,  grossly normal neurologically. Psych:  Cooperative. Normal mood and affect.  Lab Results:  Recent Labs  06/18/14 1335 06/18/14 2110 06/19/14 0510  WBC 7.9 7.7 8.1  HGB 8.7* 8.0* 8.1*  HCT 28.8* 25.7* 26.5*  PLT 198 183 192   BMET  Recent Labs  06/18/14 0113 06/19/14 0510  NA 140 142  K 4.3 3.9  CL 107 109  CO2 27 28  GLUCOSE 130* 116*  BUN 45* 34*  CREATININE 1.55* 1.17*  CALCIUM 8.9 8.7   LFT  Recent Labs  06/19/14 0510  PROT 5.6*  ALBUMIN 2.8*  AST 22  ALT 13  ALKPHOS 66  BILITOT 0.7   PT/INR  Recent Labs  06/18/14 0113  LABPROT 13.8  INR 1.05     Assessment / Plan:      81 year old female with hematochezia, suspect diverticular hemorrhage. Hgb stable overnight at 8.1. Passed what sound like some old clots this am. Bleed seems to be resolving. She could advance to full liquids.    LOS: 1 day   Tye Savoy  06/19/2014, 9:23 AM

## 2014-06-20 ENCOUNTER — Inpatient Hospital Stay (HOSPITAL_COMMUNITY): Payer: Medicare Other

## 2014-06-20 DIAGNOSIS — D62 Acute posthemorrhagic anemia: Secondary | ICD-10-CM

## 2014-06-20 DIAGNOSIS — M25559 Pain in unspecified hip: Secondary | ICD-10-CM | POA: Insufficient documentation

## 2014-06-20 DIAGNOSIS — M25551 Pain in right hip: Secondary | ICD-10-CM

## 2014-06-20 LAB — GLUCOSE, CAPILLARY
Glucose-Capillary: 122 mg/dL — ABNORMAL HIGH (ref 70–99)
Glucose-Capillary: 99 mg/dL (ref 70–99)
Glucose-Capillary: 117 mg/dL — ABNORMAL HIGH (ref 70–99)
Glucose-Capillary: 145 mg/dL — ABNORMAL HIGH (ref 70–99)
Glucose-Capillary: 121 mg/dL — ABNORMAL HIGH (ref 70–99)

## 2014-06-20 LAB — BASIC METABOLIC PANEL
Anion gap: 5 (ref 5–15)
BUN: 25 mg/dL — ABNORMAL HIGH (ref 6–23)
CO2: 28 mmol/L (ref 19–32)
Calcium: 8.5 mg/dL (ref 8.4–10.5)
Chloride: 110 mEq/L (ref 96–112)
Creatinine, Ser: 1.08 mg/dL (ref 0.50–1.10)
GFR calc Af Amer: 54 mL/min — ABNORMAL LOW (ref >90)
GFR calc non Af Amer: 46 mL/min — ABNORMAL LOW (ref >90)
Glucose, Bld: 119 mg/dL — ABNORMAL HIGH (ref 70–99)
Potassium: 4.3 mmol/L (ref 3.5–5.1)
Sodium: 143 mmol/L (ref 135–145)

## 2014-06-20 LAB — URINE CULTURE: Colony Count: >=100000

## 2014-06-20 LAB — CBC
HCT: 22.1 % — ABNORMAL LOW (ref 36.0–46.0)
Hemoglobin: 6.8 g/dL — CL (ref 12.0–15.0)
MCH: 29.2 pg (ref 26.0–34.0)
MCHC: 30.8 g/dL (ref 30.0–36.0)
MCV: 94.8 fL (ref 78.0–100.0)
Platelets: 184 10*3/uL (ref 150–400)
RBC: 2.33 MIL/uL — ABNORMAL LOW (ref 3.87–5.11)
RDW: 16.8 % — ABNORMAL HIGH (ref 11.5–15.5)
WBC: 6.7 10*3/uL (ref 4.0–10.5)

## 2014-06-20 LAB — HEMOGLOBIN AND HEMATOCRIT, BLOOD
HCT: 23.4 % — ABNORMAL LOW (ref 36.0–46.0)
HCT: 29 % — ABNORMAL LOW (ref 36.0–46.0)
Hemoglobin: 7.3 g/dL — ABNORMAL LOW (ref 12.0–15.0)
Hemoglobin: 9.2 g/dL — ABNORMAL LOW (ref 12.0–15.0)

## 2014-06-20 LAB — PREPARE RBC (CROSSMATCH)

## 2014-06-20 MED ORDER — SODIUM CHLORIDE 0.9 % IV SOLN
Freq: Once | INTRAVENOUS | Status: AC
  Administered 2014-06-20: 10:00:00 via INTRAVENOUS

## 2014-06-20 NOTE — Progress Notes (Signed)
[facility] PROGRESS NOTE  [name] [id number] DOB: 08-01-1929 DOA: 06/18/2014 PCP: [name], MD Interim summary: 81 y.o. year old female with prior h/o chronic diastolic heart failure, comes in for right sided abdominal pain, associated with 3 bloody bowel movements. CT showed diverticulosis. She was admitted to medical service for evaluation of diverticular bleed.  Assessment/Plan: Diverticular bleed: -admitted to step down. Serial blood draws to check hemoglobin show drop in 6.8, ordered 2 units of prbc transfusion. Started on clears and will advance as tolerated. Change to PO PPI. GI CONSULTED and recommendations given.   Urinary tract infection: - on rocephin. Urine cultures are pending.  Anemia of blood loss from diverticular bleed: Stable from the last 24 hours. Transfuse to keep hemoglobin greater than 7.   CKD stage 2:  Appears to be stable. Continue to monitor.    Right hip pain and pelvis pain: Hip xrays and pelvis x rays ordered and pain control.    DVT prophylaxis.   Code Status: full code.  Family Communication: family at bedside.  Disposition Plan: transfer to med surg bed.    Consultants:  Gastroenterology consult.  Procedures:  none  Antibiotics:  Rocephin   HPI/Subjective One bowel movement with clots.   Objective: Filed Vitals:   06/20/14 1712  BP: 136/52  Pulse: 77  Temp: 98.8 F (37.1 C)  Resp: 18    Intake/Output Summary (Last 24 hours) at 06/20/14 1821 Last data filed at 06/20/14 1400  Gross per 24 hour  Intake 3422.5 ml  Output   1151 ml  Net 2271.5 ml   Filed Weights   06/18/14 0014 06/18/14 0600  Weight: 99.791 kg (220 lb) 108.5 kg (239 lb 3.2 oz)    Exam:   General:  Alert afebrile comfortable.  Cardiovascular: s1s2  Respiratory: ctab  Abdomen: soft non tender non distended bowel sounds heard.   Musculoskeletal:trace pedal edema.   Data Reviewed: Basic Metabolic Panel:  Recent Labs Lab  06/18/14 0113 06/19/14 0510 06/20/14 0157  NA 140 142 143  K 4.3 3.9 4.3  CL 107 109 110  CO2 27 28 28   GLUCOSE 130* 116* 119*  BUN 45* 34* 25*  CREATININE 1.55* 1.17* 1.08  CALCIUM 8.9 8.7 8.5   Liver Function Tests:  Recent Labs Lab 06/18/14 0113 06/19/14 0510  AST 24 22  ALT 16 13  ALKPHOS 86 66  BILITOT 0.9 0.7  PROT 6.6 5.6*  ALBUMIN 3.4* 2.8*    Recent Labs Lab 06/18/14 0113  LIPASE 27   No results for input(s): AMMONIA in the last 168 hours. CBC:  Recent Labs Lab 06/18/14 0113  06/18/14 1335 06/18/14 2110 06/19/14 0510 06/19/14 1814 06/20/14 0157 06/20/14 0811  WBC 10.4  < > 7.9 7.7 8.1 7.6  --  6.7  NEUTROABS 7.2  --   --   --   --   --   --   --   HGB 10.8*  < > 8.7* 8.0* 8.1* 7.7* 7.3* 6.8*  HCT 35.4*  < > 28.8* 25.7* 26.5* 24.7* 23.4* 22.1*  MCV 92.7  < > 93.8 94.1 95.0 93.9  --  94.8  PLT 195  < > 198 183 192 179  --  184  < > = values in this interval not displayed. Cardiac Enzymes:  Recent Labs Lab 06/18/14 0113  TROPONINI <0.03   BNP (last 3 results)  Recent Labs  08/17/13 0528 08/26/13 0547  PROBNP 10639.0* 9195.0*   CBG:  Recent Labs Lab  06/19/14 1939 06/19/14 2357 06/20/14 0331 06/20/14 0821 06/20/14 1201  GLUCAP 155* 110* 122* 99 117*    Recent Results (from the past 240 hour(s))  Culture, Urine     Status: None   Collection Time: 06/18/14  3:34 AM  Result Value Ref Range Status   Specimen Description URINE, CLEAN CATCH  Final   Special Requests NONE  Final   Colony Count   Final    >=100,000 COLONIES/ML Performed at [facility]    Culture   Final    ESCHERICHIA COLI Performed at [facility]    Report Status 06/20/2014 FINAL  Final   Organism ID, Bacteria ESCHERICHIA COLI  Final      Susceptibility   Escherichia coli - MIC*    AMPICILLIN >=32 RESISTANT Resistant     CEFAZOLIN 16 INTERMEDIATE Intermediate     CEFTRIAXONE <=1 SENSITIVE Sensitive     CIPROFLOXACIN >=4 RESISTANT  Resistant     GENTAMICIN <=1 SENSITIVE Sensitive     LEVOFLOXACIN >=8 RESISTANT Resistant     NITROFURANTOIN <=16 SENSITIVE Sensitive     TOBRAMYCIN <=1 SENSITIVE Sensitive     TRIMETH/SULFA <=20 SENSITIVE Sensitive     PIP/TAZO 8 SENSITIVE Sensitive     * ESCHERICHIA COLI  MRSA PCR Screening     Status: None   Collection Time: 06/18/14  6:45 AM  Result Value Ref Range Status   MRSA by PCR NEGATIVE NEGATIVE Final    Comment:        The GeneXpert MRSA Assay (FDA approved for NASAL specimens only), is one component of a comprehensive MRSA colonization surveillance program. It is not intended to diagnose MRSA infection nor to guide or monitor treatment for MRSA infections.   Clostridium Difficile by PCR     Status: None   Collection Time: 06/18/14  8:00 PM  Result Value Ref Range Status   C difficile by pcr NEGATIVE NEGATIVE Final    Comment: Performed at [facility]     Studies: Dg Hip Unilat With Pelvis 2-3 Views Right  06/20/2014   CLINICAL DATA:  Tripped and fell 5 days ago.  Right hip pain.  EXAM: DG HIP W/ PELVIS 2-3V*R*  COMPARISON:  08/14/2013  FINDINGS: Right hip arthroplasty remains located. Stable areas of sclerosis or cement around the right acetabulum. No evidence for a periprosthetic fracture. There is lucency along the lateral right iliac wing. This lucency could be related to overlying bowel gas or soft tissue. Otherwise, the pelvic bony ring appears intact.  IMPRESSION: Right hip arthroplasty is located without acute fracture.  Area of lucency along the right iliac wing that could be related to overlying structures rather than a fracture. If there is concern for a right iliac wing fracture, recommend further characterization with CT or dedicated pelvic images.   Electronically Signed   By: Markus Daft M.D.   On: 06/20/2014 14:10    Scheduled Meds: . sodium chloride   Intravenous Once  . cefTRIAXone (ROCEPHIN)  IV  1 g Intravenous Q24H  . insulin aspart  0-9  Units Subcutaneous 6 times per day  . pantoprazole  40 mg Oral Q0600   Continuous Infusions: . sodium chloride 75 mL/hr at 06/20/14 0153    Active Problems:   GIB (gastrointestinal bleeding)   UTI (lower urinary tract infection)   Abdominal pain   Lower GI bleed    Time spent: 25 min    [facility] Pager [phone] If 7PM-7AM, please contact  night-coverage at [email or web address], password [facility] 06/20/2014, 6:21 PM  LOS: 2 days

## 2014-06-20 NOTE — Progress Notes (Signed)
    Progress Note   Subjective  Small amount of old blood in stool. Tolerating diet. Complains of back pain, right hip area pain and pain in her right leg.    Objective  Vital signs in last 24 hours: Temp:  [97.7 F (36.5 C)-98.9 F (37.2 C)] 97.7 F (36.5 C) (01/08 0535) Pulse Rate:  [75-93] 75 (01/08 0535) Resp:  [16] 16 (01/08 0535) BP: (134-143)/(46-69) 136/57 mmHg (01/08 0535) SpO2:  [93 %-100 %] 94 % (01/08 0535) Last BM Date: 06/19/14 General:   Alert,  Well-developed,   in NAD Heart:  Regular rate and rhythm; no murmurs Abdomen:  Soft, nontender and nondistended. Normal bowel sounds, without guarding, and without rebound.   Extremities:  Without edema. Neurologic:  Alert and  oriented x4;  grossly normal neurologically. Psych:  Alert and cooperative. Normal mood and affect.  Intake/Output from previous day: 01/07 0701 - 01/08 0700 In: 2775 [P.O.:1200; I.V.:1575] Out: 1401 [Urine:1400; Stool:1] Intake/Output this shift:    Lab Results:  Recent Labs  06/19/14 0510 06/19/14 1814 06/20/14 0157 06/20/14 0811  WBC 8.1 7.6  --  6.7  HGB 8.1* 7.7* 7.3* 6.8*  HCT 26.5* 24.7* 23.4* 22.1*  PLT 192 179  --  184   BMET  Recent Labs  06/18/14 0113 06/19/14 0510 06/20/14 0157  NA 140 142 143  K 4.3 3.9 4.3  CL 107 109 110  CO2 27 28 28   GLUCOSE 130* 116* 119*  BUN 45* 34* 25*  CREATININE 1.55* 1.17* 1.08  CALCIUM 8.9 8.7 8.5   LFT  Recent Labs  06/19/14 0510  PROT 5.6*  ALBUMIN 2.8*  AST 22  ALT 13  ALKPHOS 66  BILITOT 0.7   PT/INR  Recent Labs  06/18/14 0113  LABPROT 13.8  INR 1.05      Assessment & Plan   Hematochezia, suspect diverticular hemorrhage. Hgb has drifted down to 6.8. Passed what sound like small old clots. Bleed seems to be resolving. Continue full liquids and continue to observe. Transfuse to keep Hb > 8-9. Primary service to address back pain, right hip area pain, right leg pain which is not GI related.   Active  Problems:   GIB (gastrointestinal bleeding)   UTI (lower urinary tract infection)   Abdominal pain   Lower GI bleed     LOS: 2 days   Norberto Sorenson T. Fuller Plan MD 06/20/2014, 8:49 AM

## 2014-06-20 NOTE — Progress Notes (Signed)
CRITICAL VALUE ALERT  Critical value received:  Hgb 6.8  Date of notification:  06/20/14  Time of notification:  0842  Critical value read back: yes  Nurse who received alert:  B.Lavina Hamman RN  MD notified (1st page): Karleen Hampshire MD   Time of first page:  0845  Responding MD:  Karleen Hampshire MD  Time MD responded:  [phone]

## 2014-06-21 DIAGNOSIS — K921 Melena: Secondary | ICD-10-CM

## 2014-06-21 LAB — TYPE AND SCREEN
ABO/RH(D): O NEG
Antibody Screen: NEGATIVE
Unit division: 0
Unit division: 0

## 2014-06-21 LAB — GLUCOSE, CAPILLARY
Glucose-Capillary: 111 mg/dL — ABNORMAL HIGH (ref 70–99)
Glucose-Capillary: 106 mg/dL — ABNORMAL HIGH (ref 70–99)
Glucose-Capillary: 98 mg/dL (ref 70–99)
Glucose-Capillary: 153 mg/dL — ABNORMAL HIGH (ref 70–99)
Glucose-Capillary: 120 mg/dL — ABNORMAL HIGH (ref 70–99)
Glucose-Capillary: 189 mg/dL — ABNORMAL HIGH (ref 70–99)

## 2014-06-21 LAB — CBC
HCT: 29.3 % — ABNORMAL LOW (ref 36.0–46.0)
Hemoglobin: 9.3 g/dL — ABNORMAL LOW (ref 12.0–15.0)
MCH: 29.2 pg (ref 26.0–34.0)
MCHC: 31.7 g/dL (ref 30.0–36.0)
MCV: 91.8 fL (ref 78.0–100.0)
Platelets: 174 10*3/uL (ref 150–400)
RBC: 3.19 MIL/uL — ABNORMAL LOW (ref 3.87–5.11)
RDW: 16.9 % — ABNORMAL HIGH (ref 11.5–15.5)
WBC: 7.5 10*3/uL (ref 4.0–10.5)

## 2014-06-21 LAB — BASIC METABOLIC PANEL
Anion gap: 7 (ref 5–15)
BUN: 17 mg/dL (ref 6–23)
CO2: 27 mmol/L (ref 19–32)
Calcium: 8.6 mg/dL (ref 8.4–10.5)
Chloride: 110 mEq/L (ref 96–112)
Creatinine, Ser: 0.98 mg/dL (ref 0.50–1.10)
GFR calc Af Amer: 61 mL/min — ABNORMAL LOW (ref >90)
GFR calc non Af Amer: 52 mL/min — ABNORMAL LOW (ref >90)
Glucose, Bld: 114 mg/dL — ABNORMAL HIGH (ref 70–99)
Potassium: 4 mmol/L (ref 3.5–5.1)
Sodium: 144 mmol/L (ref 135–145)

## 2014-06-21 NOTE — Progress Notes (Signed)
[facility] PROGRESS NOTE  [name] [id number] DOB: [birth year] DOA: 06/18/2014 PCP: [name], MD Interim summary: 81 y.o. year old female with prior h/o chronic diastolic heart failure, comes in for right sided abdominal pain, associated with 3 bloody bowel movements. CT showed diverticulosis. She was admitted to medical service for evaluation of diverticular bleed.  Assessment/Plan: Diverticular bleed: -admitted to step down. Serial blood draws to check hemoglobin show drop in 6.8 on 1/8, ordered 2 units of prbc transfusion.  Her H&H is 9.3 an dstable. Continue to monitor. Started on clears and will advance as tolerated. Change to PO PPI. GI CONSULTED and recommendations given.   Urinary tract infection: - on rocephin. Urine cultures show E COLI sensitive to rocephin.  Anemia of blood loss from diverticular bleed: Stable from the last 24 hours. Transfuse to keep hemoglobin greater than 7.   CKD stage 2:  Appears to be stable. Continue to monitor.    Right hip pain and pelvis pain: Hip xrays and pelvis x rays ordered and pain control.    DVT prophylaxis.   Code Status: full code.  Family Communication: family at bedside.  Disposition Plan: transfer to med surg bed.    Consultants:  Gastroenterology consult.  Procedures:  none  Antibiotics:  Rocephin   HPI/Subjective One bowel movement with clots.   Objective: Filed Vitals:   06/21/14 1407  BP: 154/55  Pulse: 81  Temp: 98 F (36.7 C)  Resp: 18    Intake/Output Summary (Last 24 hours) at 06/21/14 1828 Last data filed at 06/21/14 1729  Gross per 24 hour  Intake   3036 ml  Output    620 ml  Net   2416 ml   Filed Weights   06/18/14 0014 06/18/14 0600  Weight: 99.791 kg (220 lb) 108.5 kg (239 lb 3.2 oz)    Exam:   General:  Alert afebrile comfortable.  Cardiovascular: s1s2  Respiratory: ctab  Abdomen: soft non tender non distended bowel sounds heard.   Musculoskeletal:trace pedal  edema.   Data Reviewed: Basic Metabolic Panel:  Recent Labs Lab 06/18/14 0113 06/19/14 0510 06/20/14 0157 06/21/14 0622  NA 140 142 143 144  K 4.3 3.9 4.3 4.0  CL 107 109 110 110  CO2 27 28 28 27   GLUCOSE 130* 116* 119* 114*  BUN 45* 34* 25* 17  CREATININE 1.55* 1.17* 1.08 0.98  CALCIUM 8.9 8.7 8.5 8.6   Liver Function Tests:  Recent Labs Lab 06/18/14 0113 06/19/14 0510  AST 24 22  ALT 16 13  ALKPHOS 86 66  BILITOT 0.9 0.7  PROT 6.6 5.6*  ALBUMIN 3.4* 2.8*    Recent Labs Lab 06/18/14 0113  LIPASE 27   No results for input(s): AMMONIA in the last 168 hours. CBC:  Recent Labs Lab 06/18/14 0113  06/18/14 2110 06/19/14 0510 06/19/14 1814 06/20/14 0157 06/20/14 0811 06/20/14 2230 06/21/14 0622  WBC 10.4  < > 7.7 8.1 7.6  --  6.7  --  7.5  NEUTROABS 7.2  --   --   --   --   --   --   --   --   HGB 10.8*  < > 8.0* 8.1* 7.7* 7.3* 6.8* 9.2* 9.3*  HCT 35.4*  < > 25.7* 26.5* 24.7* 23.4* 22.1* 29.0* 29.3*  MCV 92.7  < > 94.1 95.0 93.9  --  94.8  --  91.8  PLT 195  < > 183 192 179  --  184  --  174  < > = values in this interval not displayed. Cardiac Enzymes:  Recent Labs Lab 06/18/14 0113  TROPONINI <0.03   BNP (last 3 results)  Recent Labs  08/17/13 0528 08/26/13 0547  PROBNP 10639.0* 9195.0*   CBG:  Recent Labs Lab 06/20/14 2325 06/21/14 0351 06/21/14 0755 06/21/14 1212 06/21/14 1637  GLUCAP 121* 106* 98 153* 120*    Recent Results (from the past 240 hour(s))  Culture, Urine     Status: None   Collection Time: 06/18/14  3:34 AM  Result Value Ref Range Status   Specimen Description URINE, CLEAN CATCH  Final   Special Requests NONE  Final   Colony Count   Final    >=100,000 COLONIES/ML Performed at [facility]    Culture   Final    ESCHERICHIA COLI Performed at [facility]    Report Status 06/20/2014 FINAL  Final   Organism ID, Bacteria ESCHERICHIA COLI  Final      Susceptibility   Escherichia coli - MIC*     AMPICILLIN >=32 RESISTANT Resistant     CEFAZOLIN 16 INTERMEDIATE Intermediate     CEFTRIAXONE <=1 SENSITIVE Sensitive     CIPROFLOXACIN >=4 RESISTANT Resistant     GENTAMICIN <=1 SENSITIVE Sensitive     LEVOFLOXACIN >=8 RESISTANT Resistant     NITROFURANTOIN <=16 SENSITIVE Sensitive     TOBRAMYCIN <=1 SENSITIVE Sensitive     TRIMETH/SULFA <=20 SENSITIVE Sensitive     PIP/TAZO 8 SENSITIVE Sensitive     * ESCHERICHIA COLI  MRSA PCR Screening     Status: None   Collection Time: 06/18/14  6:45 AM  Result Value Ref Range Status   MRSA by PCR NEGATIVE NEGATIVE Final    Comment:        The GeneXpert MRSA Assay (FDA approved for NASAL specimens only), is one component of a comprehensive MRSA colonization surveillance program. It is not intended to diagnose MRSA infection nor to guide or monitor treatment for MRSA infections.   Clostridium Difficile by PCR     Status: None   Collection Time: 06/18/14  8:00 PM  Result Value Ref Range Status   C difficile by pcr NEGATIVE NEGATIVE Final    Comment: Performed at [facility]     Studies: Dg Pelvis Portable  06/20/2014   CLINICAL DATA:  Subsequent evaluation for lucency lateral right iliac wing  EXAM: PORTABLE PELVIS 1-2 VIEWS  COMPARISON:  06/20/2014 at 1410 hr  FINDINGS: This single portable image is of limited technique, with annotation superimposed over the edge of the area of interest. There is also slightly different rotation when compared to the prior study. I do not visualize a fracture over the area of interest.  IMPRESSION: It appears that the lucency over the lateral right iliac bone represented soft tissue adipose tissue projecting over bone, but the current study is limited by slightly different rotation an limited technique with the edge of the area of interest partially obscured by annotations placed on the image.   Electronically Signed   By: Skipper Cliche M.D.   On: 06/20/2014 21:02   Dg Hip Unilat With Pelvis 2-3  Views Right  06/20/2014   CLINICAL DATA:  Tripped and fell 5 days ago.  Right hip pain.  EXAM: DG HIP W/ PELVIS 2-3V*R*  COMPARISON:  08/14/2013  FINDINGS: Right hip arthroplasty remains located. Stable areas of sclerosis or cement around the right acetabulum. No evidence for a periprosthetic fracture. There is lucency along  the lateral right iliac wing. This lucency could be related to overlying bowel gas or soft tissue. Otherwise, the pelvic bony ring appears intact.  IMPRESSION: Right hip arthroplasty is located without acute fracture.  Area of lucency along the right iliac wing that could be related to overlying structures rather than a fracture. If there is concern for a right iliac wing fracture, recommend further characterization with CT or dedicated pelvic images.   Electronically Signed   By: Markus Daft M.D.   On: 06/20/2014 14:10    Scheduled Meds: . sodium chloride   Intravenous Once  . cefTRIAXone (ROCEPHIN)  IV  1 g Intravenous Q24H  . insulin aspart  0-9 Units Subcutaneous 6 times per day  . pantoprazole  40 mg Oral Q0600   Continuous Infusions: . sodium chloride 75 mL/hr at 06/21/14 1147    Active Problems:   GIB (gastrointestinal bleeding)   UTI (lower urinary tract infection)   Abdominal pain   Lower GI bleed   Hip pain    Time spent: 25 min    Iylah Dworkin  [facility] Pager [phone] If 7PM-7AM, please contact night-coverage at [email or web address], password [facility] 06/21/2014, 6:28 PM  LOS: 3 days

## 2014-06-21 NOTE — Progress Notes (Signed)
    Progress Note   Subjective  Feels better today, passed some stool with "old blood" this am   Objective   Vital signs in last 24 hours: Temp:  [98 F (36.7 C)-98.8 F (37.1 C)] 98 F (36.7 C) (01/09 0510) Pulse Rate:  [72-85] 74 (01/09 0510) Resp:  [16-18] 18 (01/09 0510) BP: (136-170)/(35-64) 168/64 mmHg (01/09 0510) SpO2:  [96 %-100 %] 100 % (01/09 0510) Last BM Date: 06/20/14 General:  Elderly white female in NAD Heart:  Regular rate and rhythm Lungs: Respirations even and unlabored, lungs CTA bilaterally Abdomen:  Soft, nontender and nondistended. Normal bowel sounds. Neurologic:  Alert and oriented,  grossly normal neurologically. Psych:  Cooperative. Normal mood and affect.  Lab Results:  Recent Labs  06/19/14 1814  06/20/14 0811 06/20/14 2230 06/21/14 0622  WBC 7.6  --  6.7  --  7.5  HGB 7.7*  < > 6.8* 9.2* 9.3*  HCT 24.7*  < > 22.1* 29.0* 29.3*  PLT 179  --  184  --  174  < > = values in this interval not displayed. BMET  Recent Labs  06/19/14 0510 06/20/14 0157 06/21/14 0622  NA 142 143 144  K 3.9 4.3 4.0  CL 109 110 110  CO2 28 28 27   GLUCOSE 116* 119* 114*  BUN 34* 25* 17  CREATININE 1.17* 1.08 0.98  CALCIUM 8.7 8.5 8.6      Assessment / Plan:   Hematochezia, suspect diverticular hemorrhage. Hgb drifted down to 6.8 yesterday but up to 9.3 today after 2 units of blood.  Passed some blood in BM today. Again, she describes it as "old blood". Hopefully she is right and bleed is resolving but would continue to obsereve for today. Continue full liquids      LOS: 3 days   Tye Savoy  06/21/2014, 9:55 AM     Attending physician's note   I have taken an interval history, reviewed the chart and examined the patient. I agree with the Advanced Practitioner's note, impression and recommendations. Suspected diverticular bleed. Slowly resolving. Hb increased to 9.3 post transfusion. For today would maintain full liquid diet, observe and trend Hb.     Pricilla Riffle. Fuller Plan, MD [facility]

## 2014-06-22 LAB — CBC
HCT: 49.1 % — ABNORMAL HIGH (ref 36.0–46.0)
HCT: 32.7 % — ABNORMAL LOW (ref 36.0–46.0)
Hemoglobin: 15 g/dL (ref 12.0–15.0)
Hemoglobin: 10.3 g/dL — ABNORMAL LOW (ref 12.0–15.0)
MCH: 31.8 pg (ref 26.0–34.0)
MCH: 29.3 pg (ref 26.0–34.0)
MCHC: 30.5 g/dL (ref 30.0–36.0)
MCHC: 31.5 g/dL (ref 30.0–36.0)
MCV: 104.2 fL — ABNORMAL HIGH (ref 78.0–100.0)
MCV: 92.9 fL (ref 78.0–100.0)
Platelets: 154 10*3/uL (ref 150–400)
Platelets: 203 10*3/uL (ref 150–400)
RBC: 4.71 MIL/uL (ref 3.87–5.11)
RBC: 3.52 MIL/uL — ABNORMAL LOW (ref 3.87–5.11)
RDW: 18.3 % — ABNORMAL HIGH (ref 11.5–15.5)
RDW: 17 % — ABNORMAL HIGH (ref 11.5–15.5)
WBC: 10.9 10*3/uL — ABNORMAL HIGH (ref 4.0–10.5)
WBC: 7.3 10*3/uL (ref 4.0–10.5)

## 2014-06-22 LAB — GLUCOSE, CAPILLARY
Glucose-Capillary: 110 mg/dL — ABNORMAL HIGH (ref 70–99)
Glucose-Capillary: 102 mg/dL — ABNORMAL HIGH (ref 70–99)
Glucose-Capillary: 129 mg/dL — ABNORMAL HIGH (ref 70–99)
Glucose-Capillary: 114 mg/dL — ABNORMAL HIGH (ref 70–99)
Glucose-Capillary: 133 mg/dL — ABNORMAL HIGH (ref 70–99)

## 2014-06-22 MED ORDER — LISINOPRIL 20 MG PO TABS
20.0000 mg | ORAL_TABLET | Freq: Every day | ORAL | Status: DC
  Administered 2014-06-22 – 2014-06-23 (×2): 20 mg via ORAL
  Filled 2014-06-22 (×2): qty 1

## 2014-06-22 MED ORDER — INSULIN ASPART 100 UNIT/ML ~~LOC~~ SOLN: 0.0000 [IU] | Freq: Three times a day (TID) | SUBCUTANEOUS | Status: DC

## 2014-06-22 NOTE — Progress Notes (Signed)
Progress Note   Subjective   No bowel movements or bleeding   Objective  Vital signs in last 24 hours: Temp:  [97.9 F (36.6 C)-98.5 F (36.9 C)] 98.5 F (36.9 C) (01/10 0532) Pulse Rate:  [72-86] 86 (01/10 0532) Resp:  [18] 18 (01/10 0532) BP: (154-169)/(55-66) 169/66 mmHg (01/10 0532) SpO2:  [96 %-97 %] 97 % (01/10 0532) Last BM Date: 06/21/14   General:   Alert, well-developed, elderly female  in NAD Heart:  Regular rate and rhythm; no murmurs Chest: CTA Abdomen:  Soft, nontender and nondistended. Normal bowel sounds, without guarding, and without rebound.   Extremities:  Without edema. Neurologic:  Alert and  oriented x4;  grossly normal neurologically. Psych:  Alert and cooperative. Normal mood and affect.  Intake/Output from previous day: 01/09 0701 - 01/10 0700 In: 2270 [P.O.:420; I.V.:1800; IV Piggyback:50] Out: 920 [Urine:920] Intake/Output this shift: Total I/O In: 120 [P.O.:120] Out: 200 [Urine:200]  Lab Results:  Recent Labs  06/19/14 1814  06/20/14 0811 06/20/14 2230 06/21/14 0622  WBC 7.6  --  6.7  --  7.5  HGB 7.7*  < > 6.8* 9.2* 9.3*  HCT 24.7*  < > 22.1* 29.0* 29.3*  PLT 179  --  184  --  174  < > = values in this interval not displayed. BMET  Recent Labs  06/20/14 0157 06/21/14 0622  NA 143 144  K 4.3 4.0  CL 110 110  CO2 28 27  GLUCOSE 119* 114*  BUN 25* 17  CREATININE 1.08 0.98  CALCIUM 8.5 8.6    Studies/Results: Dg Pelvis Portable  06/20/2014   CLINICAL DATA:  Subsequent evaluation for lucency lateral right iliac wing  EXAM: PORTABLE PELVIS 1-2 VIEWS  COMPARISON:  06/20/2014 at 1410 hr  FINDINGS: This single portable image is of limited technique, with annotation superimposed over the edge of the area of interest. There is also slightly different rotation when compared to the prior study. I do not visualize a fracture over the area of interest.  IMPRESSION: It appears that the lucency over the lateral right iliac bone  represented soft tissue adipose tissue projecting over bone, but the current study is limited by slightly different rotation an limited technique with the edge of the area of interest partially obscured by annotations placed on the image.   Electronically Signed   By: Skipper Cliche M.D.   On: 06/20/2014 21:02   Dg Hip Unilat With Pelvis 2-3 Views Right  06/20/2014   CLINICAL DATA:  Tripped and fell 5 days ago.  Right hip pain.  EXAM: DG HIP W/ PELVIS 2-3V*R*  COMPARISON:  08/14/2013  FINDINGS: Right hip arthroplasty remains located. Stable areas of sclerosis or cement around the right acetabulum. No evidence for a periprosthetic fracture. There is lucency along the lateral right iliac wing. This lucency could be related to overlying bowel gas or soft tissue. Otherwise, the pelvic bony ring appears intact.  IMPRESSION: Right hip arthroplasty is located without acute fracture.  Area of lucency along the right iliac wing that could be related to overlying structures rather than a fracture. If there is concern for a right iliac wing fracture, recommend further characterization with CT or dedicated pelvic images.   Electronically Signed   By: Markus Daft M.D.   On: 06/20/2014 14:10      Assessment & Plan   Hematochezia, suspect diverticular hemorrhage, resolved. Check CBC this morning. Advance to soft diet. If Hb stable should be ok for  discharge from GI standpoint. GI follow up with Dr. Scarlette Shorts as needed. Follow up with PCP post discharge. GI signing off.   Active Problems:   GIB (gastrointestinal bleeding)   UTI (lower urinary tract infection)   Abdominal pain   Lower GI bleed   Hip pain    LOS: 4 days   [name] Plan MD  06/22/2014, 9:53 AM

## 2014-06-22 NOTE — Progress Notes (Signed)
[facility] PROGRESS NOTE  [name] [id number] DOB: [birth year] DOA: 06/18/2014 PCP: [name], MD Interim summary: 81 y.o. year old female with prior h/o chronic diastolic heart failure, comes in for right sided abdominal pain, associated with 3 bloody bowel movements. CT showed diverticulosis. She was admitted to medical service for evaluation of diverticular bleed.  Assessment/Plan: Diverticular bleed: -admitted to step down. Serial blood draws to check hemoglobin show drop in 6.8 on 1/8, ordered 2 units of prbc transfusion.  Her H&H is 9.3 and stable. Continue to monitor. Started on clears and will advance as tolerated. Change to PO PPI. GI CONSULTED and recommendations given.   Urinary tract infection: - on rocephin. Urine cultures show E COLI sensitive to rocephin. Wills top the antibiotic after today's dose.  Anemia of blood loss from diverticular bleed: Stable from the last 24 hours. Transfuse to keep hemoglobin greater than 7.   CKD stage 2:  Appears to be stable. Continue to monitor.    Right hip pain and pelvic pain: Hip xrays and pelvis x rays ordered, negative for fracture and pain control.   Diabetes mellitus:  hgba1c  SSI.    DVT prophylaxis.   Code Status: full code.  Family Communication:none  at bedside.  Disposition Plan: possible d/c in am.    Consultants:  Gastroenterology consult.  Procedures:  none  Antibiotics:  Rocephin   HPI/Subjective One bowel movement with clots.   Objective: Filed Vitals:   06/22/14 1442  BP: 176/71  Pulse: 74  Temp: 97.7 F (36.5 C)  Resp: 16    Intake/Output Summary (Last 24 hours) at 06/22/14 1651 Last data filed at 06/22/14 1443  Gross per 24 hour  Intake   1850 ml  Output   1300 ml  Net    550 ml   Filed Weights   06/18/14 0014 06/18/14 0600  Weight: 99.791 kg (220 lb) 108.5 kg (239 lb 3.2 oz)    Exam:   General:  Alert afebrile comfortable.  Cardiovascular:  s1s2  Respiratory: ctab  Abdomen: soft non tender non distended bowel sounds heard.   Musculoskeletal:trace pedal edema.   Data Reviewed: Basic Metabolic Panel:  Recent Labs Lab 06/18/14 0113 06/19/14 0510 06/20/14 0157 06/21/14 0622  NA 140 142 143 144  K 4.3 3.9 4.3 4.0  CL 107 109 110 110  CO2 27 28 28 27   GLUCOSE 130* 116* 119* 114*  BUN 45* 34* 25* 17  CREATININE 1.55* 1.17* 1.08 0.98  CALCIUM 8.9 8.7 8.5 8.6   Liver Function Tests:  Recent Labs Lab 06/18/14 0113 06/19/14 0510  AST 24 22  ALT 16 13  ALKPHOS 86 66  BILITOT 0.9 0.7  PROT 6.6 5.6*  ALBUMIN 3.4* 2.8*    Recent Labs Lab 06/18/14 0113  LIPASE 27   No results for input(s): AMMONIA in the last 168 hours. CBC:  Recent Labs Lab 06/18/14 0113  06/19/14 0510 06/19/14 1814 06/20/14 0157 06/20/14 0811 06/20/14 2230 06/21/14 0622 06/22/14 1127  WBC 10.4  < > 8.1 7.6  --  6.7  --  7.5 10.9*  NEUTROABS 7.2  --   --   --   --   --   --   --   --   HGB 10.8*  < > 8.1* 7.7* 7.3* 6.8* 9.2* 9.3* 15.0  HCT 35.4*  < > 26.5* 24.7* 23.4* 22.1* 29.0* 29.3* 49.1*  MCV 92.7  < > 95.0 93.9  --  94.8  --  91.8  104.2*  PLT 195  < > 192 179  --  184  --  174 154  < > = values in this interval not displayed. Cardiac Enzymes:  Recent Labs Lab 06/18/14 0113  TROPONINI <0.03   BNP (last 3 results)  Recent Labs  08/17/13 0528 08/26/13 0547  PROBNP 10639.0* 9195.0*   CBG:  Recent Labs Lab 06/21/14 2004 06/22/14 0440 06/22/14 0824 06/22/14 1204 06/22/14 1613  GLUCAP 189* 110* 102* 129* 114*    Recent Results (from the past 240 hour(s))  Culture, Urine     Status: None   Collection Time: 06/18/14  3:34 AM  Result Value Ref Range Status   Specimen Description URINE, CLEAN CATCH  Final   Special Requests NONE  Final   Colony Count   Final    >=100,000 COLONIES/ML Performed at [facility]    Culture   Final    ESCHERICHIA COLI Performed at [facility]    Report  Status 06/20/2014 FINAL  Final   Organism ID, Bacteria ESCHERICHIA COLI  Final      Susceptibility   Escherichia coli - MIC*    AMPICILLIN >=32 RESISTANT Resistant     CEFAZOLIN 16 INTERMEDIATE Intermediate     CEFTRIAXONE <=1 SENSITIVE Sensitive     CIPROFLOXACIN >=4 RESISTANT Resistant     GENTAMICIN <=1 SENSITIVE Sensitive     LEVOFLOXACIN >=8 RESISTANT Resistant     NITROFURANTOIN <=16 SENSITIVE Sensitive     TOBRAMYCIN <=1 SENSITIVE Sensitive     TRIMETH/SULFA <=20 SENSITIVE Sensitive     PIP/TAZO 8 SENSITIVE Sensitive     * ESCHERICHIA COLI  MRSA PCR Screening     Status: None   Collection Time: 06/18/14  6:45 AM  Result Value Ref Range Status   MRSA by PCR NEGATIVE NEGATIVE Final    Comment:        The GeneXpert MRSA Assay (FDA approved for NASAL specimens only), is one component of a comprehensive MRSA colonization surveillance program. It is not intended to diagnose MRSA infection nor to guide or monitor treatment for MRSA infections.   Clostridium Difficile by PCR     Status: None   Collection Time: 06/18/14  8:00 PM  Result Value Ref Range Status   C difficile by pcr NEGATIVE NEGATIVE Final    Comment: Performed at [facility]     Studies: Dg Pelvis Portable  06/20/2014   CLINICAL DATA:  Subsequent evaluation for lucency lateral right iliac wing  EXAM: PORTABLE PELVIS 1-2 VIEWS  COMPARISON:  06/20/2014 at 1410 hr  FINDINGS: This single portable image is of limited technique, with annotation superimposed over the edge of the area of interest. There is also slightly different rotation when compared to the prior study. I do not visualize a fracture over the area of interest.  IMPRESSION: It appears that the lucency over the lateral right iliac bone represented soft tissue adipose tissue projecting over bone, but the current study is limited by slightly different rotation an limited technique with the edge of the area of interest partially obscured by annotations  placed on the image.   Electronically Signed   By: Skipper Cliche M.D.   On: 06/20/2014 21:02    Scheduled Meds: . sodium chloride   Intravenous Once  . cefTRIAXone (ROCEPHIN)  IV  1 g Intravenous Q24H  . insulin aspart  0-9 Units Subcutaneous 6 times per day  . lisinopril  20 mg Oral Daily  . pantoprazole  40  mg Oral Q0600   Continuous Infusions:    Active Problems:   GIB (gastrointestinal bleeding)   UTI (lower urinary tract infection)   Abdominal pain   Lower GI bleed   Hip pain    Time spent: 25 min    Hymie Gorr  [facility] Pager [phone] If 7PM-7AM, please contact night-coverage at [email or web address], password [facility] 06/22/2014, 4:51 PM  LOS: 4 days

## 2014-06-23 LAB — CBC
HCT: 30.9 % — ABNORMAL LOW (ref 36.0–46.0)
Hemoglobin: 9.7 g/dL — ABNORMAL LOW (ref 12.0–15.0)
MCH: 29 pg (ref 26.0–34.0)
MCHC: 31.4 g/dL (ref 30.0–36.0)
MCV: 92.2 fL (ref 78.0–100.0)
Platelets: 201 10*3/uL (ref 150–400)
RBC: 3.35 MIL/uL — ABNORMAL LOW (ref 3.87–5.11)
RDW: 16.8 % — ABNORMAL HIGH (ref 11.5–15.5)
WBC: 7.4 10*3/uL (ref 4.0–10.5)

## 2014-06-23 LAB — HEMOGLOBIN A1C
Hgb A1c MFr Bld: 6 % — ABNORMAL HIGH (ref <5.7)
Mean Plasma Glucose: 126 mg/dL — ABNORMAL HIGH (ref <117)

## 2014-06-23 LAB — GLUCOSE, CAPILLARY
Glucose-Capillary: 115 mg/dL — ABNORMAL HIGH (ref 70–99)
Glucose-Capillary: 106 mg/dL — ABNORMAL HIGH (ref 70–99)
Glucose-Capillary: 108 mg/dL — ABNORMAL HIGH (ref 70–99)

## 2014-06-23 MED ORDER — PANTOPRAZOLE SODIUM 40 MG PO TBEC: 40.0000 mg | DELAYED_RELEASE_TABLET | Freq: Every day | ORAL | Status: DC

## 2014-06-23 MED ORDER — ASPIRIN 325 MG PO TBEC: 325.0000 mg | DELAYED_RELEASE_TABLET | ORAL | Status: AC

## 2014-06-23 NOTE — Evaluation (Signed)
Physical Therapy Evaluation Patient Details Name: [name] MRN: [id number] DOB: [birth year] Today's Date: 06/23/2014   History of Present Illness  81 yo female admitted with GI bleed. Hx of HTN, obesity, CVA, gout, breast cancer, a flutter, dvt, chf, ckd, R THA.   Clinical Impression  On eval, pt required Min guard assist for mobility-able to walk ~45 feet x2 with use of walker; Mod assist for toilet hygiene-pt incontinent of bowels. Dyspnea 3/4, fatigue with short ambulation distance. Pt declines SNF placement-states husband can assist as needed at home. Pt was agreeable to HHPT so this is recommended.     Follow Up Recommendations Home health PT;Supervision/Assistance - 24 hour (pt declines SNF placement)    Equipment Recommendations  None recommended by PT    Recommendations for Other Services       Precautions / Restrictions Precautions Precautions: Fall Restrictions Weight Bearing Restrictions: No      Mobility  Bed Mobility   Bed Mobility: Supine to Sit     Supine to sit: HOB elevated;Supervision     General bed mobility comments: Increased time and effort. Encouraged pt to perform unassisted.   Transfers Overall transfer level: Needs assistance Equipment used: 4-wheeled walker Transfers: Stand Pivot Transfers;Sit to/from Stand Sit to Stand: Min guard Stand pivot transfers: Min guard       General transfer comment: close guard for safety. Occasional cueing for walker safety  Ambulation/Gait Ambulation/Gait assistance: Min guard Ambulation Distance (Feet): 45 Feet (x2) Assistive device: 4-wheeled walker Gait Pattern/deviations: Step-through pattern;Decreased stride length;Trunk flexed     General Gait Details: Pt maintains flexed posture with forearms resting on walker handles. VCs safety, pace, pursed lip breathing. Dyspnea 3/4 with short distance. Seated rest break needed after ~45 feet.   Stairs            Wheelchair Mobility    Modified  Rankin (Stroke Patients Only)       Balance Overall balance assessment: Needs assistance         Standing balance support: Bilateral upper extremity supported;During functional activity Standing balance-Leahy Scale: Poor                               Pertinent Vitals/Pain Pain Assessment: Faces Faces Pain Scale: Hurts little more Pain Location: back/buttocks -pt reports fall ~1 week ago (slid off of a bar stool) Pain Descriptors / Indicators: Sore Pain Intervention(s): Monitored during session    Home Living Family/patient expects to be discharged to:: Private residence Living Arrangements: Spouse/significant other   Type of Home: House Home Access: Stairs to enter Entrance Stairs-Rails: Right Entrance Stairs-Number of Steps: 3 Home Layout: One level Home Equipment: Environmental consultant - 4 wheels      Prior Function Level of Independence: Independent with assistive device(s)               Hand Dominance        Extremity/Trunk Assessment   Upper Extremity Assessment: Generalized weakness           Lower Extremity Assessment: Generalized weakness      Cervical / Trunk Assessment: Kyphotic  Communication   Communication: No difficulties  Cognition Arousal/Alertness: Awake/alert Behavior During Therapy: WFL for tasks assessed/performed Overall Cognitive Status: Within Functional Limits for tasks assessed                      General Comments      Exercises  Assessment/Plan    PT Assessment Patient needs continued PT services  PT Diagnosis Difficulty walking;Generalized weakness;Acute pain;Abnormality of gait   PT Problem List Decreased strength;Decreased activity tolerance;Decreased balance;Decreased mobility;Obesity;Pain  PT Treatment Interventions DME instruction;Gait training;Functional mobility training;Therapeutic activities;Therapeutic exercise;Patient/family education;Balance training   PT Goals (Current goals can be  found in the Care Plan section) Acute Rehab PT Goals Patient Stated Goal: home today PT Goal Formulation: With patient Time For Goal Achievement: 07/07/14 Potential to Achieve Goals: Good    Frequency Min 3X/week   Barriers to discharge        Co-evaluation               End of Session   Activity Tolerance: Patient limited by fatigue Patient left: in chair;with call bell/phone within reach;with family/visitor present           Time: [id number] PT Time Calculation (min) (ACUTE ONLY): 31 min   Charges:   PT Evaluation $Initial PT Evaluation Tier I: 1 Procedure PT Treatments $Gait Training: 8-22 mins $Therapeutic Activity: 8-22 mins   PT G Codes:        Weston Anna, MPT Pager: [phone]

## 2014-06-23 NOTE — Progress Notes (Signed)
Nurse reviewed discharge instructions with pt.  Pt verbalized understanding of discharge instructions, follow up appointments and new medications.  No concerns at time of discharge. 

## 2014-06-27 NOTE — Discharge Summary (Signed)
Physician Discharge Summary  [name] [id number] DOB: 1930-05-23 DOA: 06/18/2014  PCP: [name], MD  Admit date: 06/18/2014 Discharge date: 06/27/2014  Time spent: 30 minutes  Recommendations for Outpatient Follow-up:  Follow up with PCP as recommended Follow up with gastroenterology as needed.  Discharge Diagnoses:  Active Problems:   GIB (gastrointestinal bleeding)   UTI (lower urinary tract infection)   Abdominal pain   Lower GI bleed   Hip pain   Discharge Condition: IMPROVED  Diet recommendation: REGULAR  Filed Weights   06/18/14 0014 06/18/14 0600  Weight: 99.791 kg (220 lb) 108.5 kg (239 lb 3.2 oz)    History of present illness:  81 y.o. year old female with prior h/o chronic diastolic heart failure, comes in for right sided abdominal pain, associated with 3 bloody bowel movements. CT showed diverticulosis. She was admitted to medical service for evaluation of diverticular bleed.   Hospital Course:  Diverticular bleed: -admitted to step down. Serial blood draws to check hemoglobin show drop in 6.8 on 1/8, ordered 2 units of prbc transfusion. Her H&H is 9.3 and stable. Started on clears and will advance as tolerated. Change to PO PPI. GI CONSULTED and recommendations given. Her H&h is stable on discharge and she was discharged to follow up with gastroenterology as needed.   Urinary tract infection: - on rocephin. Urine cultures show E COLI sensitive to rocephin. Eugenie Birks top the antibiotic after today's dose as she completed the course. .  Anemia of blood loss from diverticular bleed: Stable from the last 24 hours.   CKD stage 2:  Appears to be stable. Continue to monitor.    Right hip pain and pelvic pain: Hip xrays and pelvis x rays ordered, negative for fracture and pain control.   Diabetes mellitus: SSI  Procedures:  NONE  Consultations:  GI  Discharge Exam: Filed Vitals:   06/23/14 1400  BP: 160/60  Pulse: 86  Temp: 98.5 F (36.9 C)   Resp: 18    General: alert afebrile comfortable Cardiovascular: s1s2 Respiratory: ctab  Discharge Instructions   Discharge Instructions    Diet - low sodium heart healthy    Complete by:  As directed      Discharge instructions    Complete by:  As directed   Follow up with gastroenterology as recommended.          Discharge Medication List as of 06/23/2014  2:28 PM    START taking these medications   Details  pantoprazole (PROTONIX) 40 MG tablet Take 1 tablet (40 mg total) by mouth daily., Starting 06/23/2014, Until Discontinued, Print      CONTINUE these medications which have CHANGED   Details  aspirin 325 MG EC tablet Take 1 tablet (325 mg total) by mouth every other day., Starting 06/23/2014, Until Discontinued, No Print      CONTINUE these medications which have NOT CHANGED   Details  allopurinol (ZYLOPRIM) 100 MG tablet Take 100-200 mg by mouth every other day. Take 100 mg and then 200 mg on alternating days., Until Discontinued, Historical Med    anastrozole (ARIMIDEX) 1 MG tablet Take 1 mg by mouth daily., Until Discontinued, Historical Med    cholecalciferol (VITAMIN D) 1000 UNITS tablet Take 1,000 Units by mouth every morning. , Until Discontinued, Historical Med    furosemide (LASIX) 40 MG tablet Take 1 tablet (40 mg total) by mouth every morning. Start 10/16/13, Starting 03/05/2014, Until Discontinued, Normal    lisinopril (PRINIVIL,ZESTRIL) 20 MG tablet Take 40  mg by mouth daily., Until Discontinued, Historical Med    traMADol-acetaminophen (ULTRACET) 37.5-325 MG per tablet Take 1 tablet by mouth every 6 (six) hours as needed., Starting 02/11/2014, Until Discontinued, Print      STOP taking these medications     famotidine (PEPCID) 20 MG tablet        Allergies  Allergen Reactions  . Ace Inhibitors Other (See Comments)    Tolerates lisinopril at home. Pt does not recall allergy.  . Atorvastatin     REACTION: myalgias  . Oxycodone-Acetaminophen      REACTION: confusion, fatigue  . Rosuvastatin     REACTION: leg weakness  . Simvastatin     REACTION: leg weakness  . Codeine Nausea And Vomiting and Rash  . Sulfonamide Derivatives Hives and Rash   Follow-up Information    Follow up with [name], MD. Schedule an appointment as soon as possible for a visit in 1 week.   Specialties:  Internal Medicine, Radiology   Contact information:   [place] [address] [phone]       Follow up with [facility].   Why:  Nurse and physical therapy    Contact information:   [address] [place], [place]  [address] [phone]       The results of significant diagnostics from this hospitalization (including imaging, microbiology, ancillary and laboratory) are listed below for reference.    Significant Diagnostic Studies: Ct Abdomen Pelvis W Contrast  06/18/2014   CLINICAL DATA:  Blood in stools since June 17, 2014. RIGHT abdominal pain. History of breast cancer, colon surgery, his cholecystectomy, hysterectomy. Fall from stool June 15, 2014. Back pain.  EXAM: CT ABDOMEN AND PELVIS WITH CONTRAST  TECHNIQUE: Multidetector CT imaging of the abdomen and pelvis was performed using the standard protocol following bolus administration of intravenous contrast.  CONTRAST:  75mL OMNIPAQUE IOHEXOL 300 MG/ML  SOLN  COMPARISON:  CT of the abdomen and pelvis October 10, 2013  FINDINGS: LUNG BASES: Included view of the lung bases are clear. Visualized heart and pericardium are unremarkable.  SOLID ORGANS: Calcified splenic granulomas. Pneumobilia, status post cholecystectomy. Hepatic granuloma. Pancreas, adrenal glands are nonsuspicious.  GASTROINTESTINAL TRACT: The stomach, small and large bowel are normal in course and caliber without inflammatory changes. Moderate amount of retained large bowel stool. Moderate colonic diverticulosis. Normal appendix.  KIDNEYS/ URINARY TRACT: Kidneys are orthotopic,  demonstrating symmetric enhancement. Mildly atrophic kidneys. No nephrolithiasis, hydronephrosis or solid renal masses. The unopacified ureters are normal in course and caliber. Delayed imaging through the kidneys demonstrates symmetric prompt contrast excretion within the proximal urinary collecting system. Urinary bladder is partially distended and unremarkable.  PERITONEUM/RETROPERITONEUM: No intraperitoneal free fluid nor free air. Aortoiliac vessels are normal in course and caliber, moderate calcific atherosclerosis. No lymphadenopathy by CT size criteria. Status post hysterectomy.  SOFT TISSUE/OSSEOUS STRUCTURES: Nonsuspicious. RIGHT hip total arthroplasty. Remote LEFT L3 transverse process fracture. Moderate to severe degenerative change of the lower lumbar spine. Anterior abdominal wall scarring.  IMPRESSION: No acute intra-abdominal or pelvic process.  Diverticulosis without CT findings of acute diverticulitis. Moderate amount of retained large bowel stool.  No acute osseous process though, not tailored for evaluation.   Electronically Signed   By: Elon Alas   On: 06/18/2014 04:41   Dg Pelvis Portable  06/20/2014   CLINICAL DATA:  Subsequent evaluation for lucency lateral right iliac wing  EXAM: PORTABLE PELVIS 1-2 VIEWS  COMPARISON:  06/20/2014 at  1410 hr  FINDINGS: This single portable image is of limited technique, with annotation superimposed over the edge of the area of interest. There is also slightly different rotation when compared to the prior study. I do not visualize a fracture over the area of interest.  IMPRESSION: It appears that the lucency over the lateral right iliac bone represented soft tissue adipose tissue projecting over bone, but the current study is limited by slightly different rotation an limited technique with the edge of the area of interest partially obscured by annotations placed on the image.   Electronically Signed   By: Skipper Cliche M.D.   On: 06/20/2014 21:02    Dg Hip Unilat With Pelvis 2-3 Views Right  06/20/2014   CLINICAL DATA:  Tripped and fell 5 days ago.  Right hip pain.  EXAM: DG HIP W/ PELVIS 2-3V*R*  COMPARISON:  08/14/2013  FINDINGS: Right hip arthroplasty remains located. Stable areas of sclerosis or cement around the right acetabulum. No evidence for a periprosthetic fracture. There is lucency along the lateral right iliac wing. This lucency could be related to overlying bowel gas or soft tissue. Otherwise, the pelvic bony ring appears intact.  IMPRESSION: Right hip arthroplasty is located without acute fracture.  Area of lucency along the right iliac wing that could be related to overlying structures rather than a fracture. If there is concern for a right iliac wing fracture, recommend further characterization with CT or dedicated pelvic images.   Electronically Signed   By: Markus Daft M.D.   On: 06/20/2014 14:10    Microbiology: Recent Results (from the past 240 hour(s))  Culture, Urine     Status: None   Collection Time: 06/18/14  3:34 AM  Result Value Ref Range Status   Specimen Description URINE, CLEAN CATCH  Final   Special Requests NONE  Final   Colony Count   Final    >=100,000 COLONIES/ML Performed at [facility]    Culture   Final    ESCHERICHIA COLI Performed at [facility]    Report Status 06/20/2014 FINAL  Final   Organism ID, Bacteria ESCHERICHIA COLI  Final      Susceptibility   Escherichia coli - MIC*    AMPICILLIN >=32 RESISTANT Resistant     CEFAZOLIN 16 INTERMEDIATE Intermediate     CEFTRIAXONE <=1 SENSITIVE Sensitive     CIPROFLOXACIN >=4 RESISTANT Resistant     GENTAMICIN <=1 SENSITIVE Sensitive     LEVOFLOXACIN >=8 RESISTANT Resistant     NITROFURANTOIN <=16 SENSITIVE Sensitive     TOBRAMYCIN <=1 SENSITIVE Sensitive     TRIMETH/SULFA <=20 SENSITIVE Sensitive     PIP/TAZO 8 SENSITIVE Sensitive     * ESCHERICHIA COLI  MRSA PCR Screening     Status: None   Collection Time: 06/18/14  6:45  AM  Result Value Ref Range Status   MRSA by PCR NEGATIVE NEGATIVE Final    Comment:        The GeneXpert MRSA Assay (FDA approved for NASAL specimens only), is one component of a comprehensive MRSA colonization surveillance program. It is not intended to diagnose MRSA infection nor to guide or monitor treatment for MRSA infections.   Clostridium Difficile by PCR     Status: None   Collection Time: 06/18/14  8:00 PM  Result Value Ref Range Status   C difficile by pcr NEGATIVE NEGATIVE Final    Comment: Performed at [facility]: Basic Metabolic Panel:  Recent Labs Lab 06/21/14 0622  NA 144  K 4.0  CL 110  CO2 27  GLUCOSE 114*  BUN 17  CREATININE 0.98  CALCIUM 8.6   Liver Function Tests: No results for input(s): AST, ALT, ALKPHOS, BILITOT, PROT, ALBUMIN in the last 168 hours. No results for input(s): LIPASE, AMYLASE in the last 168 hours. No results for input(s): AMMONIA in the last 168 hours. CBC:  Recent Labs Lab 06/20/14 2230 06/21/14 0622 06/22/14 1127 06/22/14 1810 06/23/14 0710  WBC  --  7.5 10.9* 7.3 7.4  HGB 9.2* 9.3* 15.0 10.3* 9.7*  HCT 29.0* 29.3* 49.1* 32.7* 30.9*  MCV  --  91.8 104.2* 92.9 92.2  PLT  --  174 154 203 201   Cardiac Enzymes: No results for input(s): CKTOTAL, CKMB, CKMBINDEX, TROPONINI in the last 168 hours. BNP: BNP (last 3 results)  Recent Labs  08/17/13 0528 08/26/13 0547  PROBNP 10639.0* 9195.0*   CBG:  Recent Labs Lab 06/22/14 1204 06/22/14 1613 06/22/14 2152 06/23/14 0738 06/23/14 1206  GLUCAP 129* 114* 133* 115* 108*       Signed:  Ellanie Oppedisano  [facility] 06/27/2014, 10:33 AM

## 2014-07-01 DIAGNOSIS — M6281 Muscle weakness (generalized): Secondary | ICD-10-CM | POA: Diagnosis not present

## 2014-07-01 DIAGNOSIS — N183 Chronic kidney disease, stage 3 (moderate): Secondary | ICD-10-CM | POA: Diagnosis not present

## 2014-07-01 DIAGNOSIS — I129 Hypertensive chronic kidney disease with stage 1 through stage 4 chronic kidney disease, or unspecified chronic kidney disease: Secondary | ICD-10-CM | POA: Diagnosis not present

## 2014-07-01 DIAGNOSIS — M159 Polyosteoarthritis, unspecified: Secondary | ICD-10-CM | POA: Diagnosis not present

## 2014-07-01 DIAGNOSIS — K5790 Diverticulosis of intestine, part unspecified, without perforation or abscess without bleeding: Secondary | ICD-10-CM | POA: Diagnosis not present

## 2014-07-01 DIAGNOSIS — I5032 Chronic diastolic (congestive) heart failure: Secondary | ICD-10-CM | POA: Diagnosis not present

## 2014-07-03 DIAGNOSIS — I129 Hypertensive chronic kidney disease with stage 1 through stage 4 chronic kidney disease, or unspecified chronic kidney disease: Secondary | ICD-10-CM | POA: Diagnosis not present

## 2014-07-03 DIAGNOSIS — N183 Chronic kidney disease, stage 3 (moderate): Secondary | ICD-10-CM | POA: Diagnosis not present

## 2014-07-03 DIAGNOSIS — M6281 Muscle weakness (generalized): Secondary | ICD-10-CM | POA: Diagnosis not present

## 2014-07-03 DIAGNOSIS — I5032 Chronic diastolic (congestive) heart failure: Secondary | ICD-10-CM | POA: Diagnosis not present

## 2014-07-03 DIAGNOSIS — K5790 Diverticulosis of intestine, part unspecified, without perforation or abscess without bleeding: Secondary | ICD-10-CM | POA: Diagnosis not present

## 2014-07-03 DIAGNOSIS — M159 Polyosteoarthritis, unspecified: Secondary | ICD-10-CM | POA: Diagnosis not present

## 2014-07-07 DIAGNOSIS — I129 Hypertensive chronic kidney disease with stage 1 through stage 4 chronic kidney disease, or unspecified chronic kidney disease: Secondary | ICD-10-CM | POA: Diagnosis not present

## 2014-07-07 DIAGNOSIS — M159 Polyosteoarthritis, unspecified: Secondary | ICD-10-CM | POA: Diagnosis not present

## 2014-07-07 DIAGNOSIS — N183 Chronic kidney disease, stage 3 (moderate): Secondary | ICD-10-CM | POA: Diagnosis not present

## 2014-07-07 DIAGNOSIS — M6281 Muscle weakness (generalized): Secondary | ICD-10-CM | POA: Diagnosis not present

## 2014-07-07 DIAGNOSIS — K5790 Diverticulosis of intestine, part unspecified, without perforation or abscess without bleeding: Secondary | ICD-10-CM | POA: Diagnosis not present

## 2014-07-07 DIAGNOSIS — I5032 Chronic diastolic (congestive) heart failure: Secondary | ICD-10-CM | POA: Diagnosis not present

## 2014-07-08 ENCOUNTER — Ambulatory Visit (INDEPENDENT_AMBULATORY_CARE_PROVIDER_SITE_OTHER): Payer: Medicare Other | Admitting: Internal Medicine

## 2014-07-08 ENCOUNTER — Encounter: Payer: Self-pay | Admitting: Internal Medicine

## 2014-07-08 VITALS — BP 132/72 | HR 87 | Temp 98.1°F | Ht 65.0 in | Wt 232.8 lb

## 2014-07-08 DIAGNOSIS — Z0189 Encounter for other specified special examinations: Secondary | ICD-10-CM

## 2014-07-08 DIAGNOSIS — N390 Urinary tract infection, site not specified: Secondary | ICD-10-CM

## 2014-07-08 DIAGNOSIS — E119 Type 2 diabetes mellitus without complications: Secondary | ICD-10-CM | POA: Diagnosis not present

## 2014-07-08 DIAGNOSIS — K922 Gastrointestinal hemorrhage, unspecified: Secondary | ICD-10-CM | POA: Diagnosis not present

## 2014-07-08 DIAGNOSIS — H9202 Otalgia, left ear: Secondary | ICD-10-CM | POA: Diagnosis not present

## 2014-07-08 DIAGNOSIS — Z0000 Encounter for general adult medical examination without abnormal findings: Secondary | ICD-10-CM

## 2014-07-08 MED ORDER — AZITHROMYCIN 250 MG PO TABS: ORAL_TABLET | ORAL | Status: DC

## 2014-07-08 NOTE — Progress Notes (Signed)
Subjective:    Patient ID: [name], female    DOB: 08/06/1929, 81 y.o.   MRN: [id number]  HPI here to f/u recent hospn with LGI bleed, felt to be divertucilar related, s/p 2 u PRBC, bleeding stopped spontaneously and pt o/w remained hemodynamically stable.  Seen per Gi in consultation.  ASA changed to 325 qod.  Since d/c - Denies worsening reflux, abd pain, dysphagia, n/v, bowel change or blood. Pt denies chest pain, increased sob or doe, wheezing, orthopnea, PND, increased LE swelling, palpitations, dizziness or syncope.  Pt denies new neurological symptoms such as new headache, or facial or extremity weakness or numbness  Walks with walker, no further falls.  Does have rather mod to severe now left ear pain, pressure, with low grade temp in last 2-3 days, with pain radiating to left neck. + HA, general malaise  Pt denies polydipsia, polyuria, Recent A1c normal Past Medical History  Diagnosis Date  . Hypertension   . Hyperlipidemia   . Morbid obesity   . Osteoarthritis   . Gout   . CVA (cerebral infarction)   . IBS (irritable bowel syndrome)   . Diverticulosis   . Esophageal stricture   . Colon polyps     hyperplastic  . Anxiety   . Hiatal hernia     4 cm  . Shingles 02/21/2011  . Skin cancer   . Breast cancer     right breast  . Dysrhythmia     dr bensimhon   . Blood transfusion   . Chronic kidney disease     occ uti's  . GERD (gastroesophageal reflux disease)   . PONV (postoperative nausea and vomiting)   . DVT of leg (deep venous thrombosis)     left; S/P OR  . Atrial flutter     "sometimes"  . Bronchitis   . Pneumonia     "walking"  . Shortness of breath on exertion   . Anemia   . Stroke 2002    residual:  "little tingling in palm of left hand"  . Kidney stones 1990's  . UTI (lower urinary tract infection)     "I've had a few"  . History of radiation therapy 02/15/12-04/02/12    right breast 4680 cGy/26 sessions, right boost=1400cGy /7 sessions   Past  Surgical History  Procedure Laterality Date  . Abdominal hysterectomy    . Bile duct stent placement  ~ 01/2011  . Pelvic bone tumor removal      twice in 2000's  . Colonic perforation repair  ~ 2000  . Total hip arthroplasty  1980's    right  . Joint replacement    . Colon surgery      hole in intestine ,tumors?  . Tonsillectomy  ~ 1946  . Cholecystectomy  03/23/11    lap chole   . Replacement total knee bilateral  ~ 2008; ~ 2010    right; left  . Cataract extraction w/ intraocular lens  implant, bilateral  1990's  . Hemorrhoid surgery  2012  . Breast lumpectomy  06/29/11    right  . Breast lumpectomy  06/29/2011    Procedure: BREAST LUMPECTOMY WITH EXCISION OF SENTINEL NODE;  Surgeon: Pedro Earls, MD;  Location: [facility];  Service: General;  Laterality: Right;  right sentinel node mapping,right sentinel node biopsy, needle localization right breast lumpectomy  . Incision and drainage breast abscess      right breast seroma  . Incision and drainage abscess N/A 08/12/2013  Procedure: INCISION AND DRAINAGE ABSCESS;  Surgeon: Earnstine Regal, MD;  Location: [facility];  Service: General;  Laterality: N/A;  . Irrigation and debridement abscess N/A 08/14/2013    Procedure: Debridment of perineal tissue and debridement of perineal wound;  Surgeon: Earnstine Regal, MD;  Location: [facility];  Service: General;  Laterality: N/A;  . Irrigation and debridement abscess N/A 08/16/2013    Procedure: DRESSING CHANGE AND DEBRIDEMENT OF PERINEUM WOUND;  Surgeon: Earnstine Regal, MD;  Location: [facility];  Service: General;  Laterality: N/A;  . Pilonidal cyst drainage N/A 08/19/2013    Procedure: Dressing change perineum;  Surgeon: Ralene Ok, MD;  Location: [facility];  Service: General;  Laterality: N/A;  . Minor application of wound vac N/A 08/20/2013    Procedure: MINOR APPLICATION OF WOUND VAC;  Surgeon: Ralene Ok, MD;  Location: [facility];  Service: General;  Laterality: N/A;  . Incision and drainage abscess N/A  08/20/2013    Procedure: INCISION AND DRAINAGE ABSCESS;  Surgeon: Ralene Ok, MD;  Location: [facility];  Service: General;  Laterality: N/A;    reports that she has never smoked. She has never used smokeless tobacco. She reports that she does not drink alcohol or use illicit drugs. family history includes Breast cancer in her sister; Diabetes in her sister; Heart disease in her maternal uncle and paternal uncle; Kidney failure in her sister; Prostate cancer in her brother; Stroke in her mother. There is no history of Colon cancer. Allergies  Allergen Reactions  . Ace Inhibitors Other (See Comments)    Tolerates lisinopril at home. Pt does not recall allergy.  . Atorvastatin     REACTION: myalgias  . Oxycodone-Acetaminophen     REACTION: confusion, fatigue  . Rosuvastatin     REACTION: leg weakness  . Simvastatin     REACTION: leg weakness  . Codeine Nausea And Vomiting and Rash  . Sulfonamide Derivatives Hives and Rash   Current Outpatient Prescriptions on File Prior to Visit  Medication Sig Dispense Refill  . allopurinol (ZYLOPRIM) 100 MG tablet Take 100-200 mg by mouth every other day. Take 100 mg and then 200 mg on alternating days.    Marland Kitchen anastrozole (ARIMIDEX) 1 MG tablet Take 1 mg by mouth daily.    Marland Kitchen aspirin 325 MG EC tablet Take 1 tablet (325 mg total) by mouth every other day.    . cholecalciferol (VITAMIN D) 1000 UNITS tablet Take 1,000 Units by mouth every morning.     . furosemide (LASIX) 40 MG tablet Take 1 tablet (40 mg total) by mouth every morning. Start 10/16/13 30 tablet 6  . lisinopril (PRINIVIL,ZESTRIL) 20 MG tablet Take 40 mg by mouth daily.    . pantoprazole (PROTONIX) 40 MG tablet Take 1 tablet (40 mg total) by mouth daily. 30 tablet 0  . traMADol-acetaminophen (ULTRACET) 37.5-325 MG per tablet Take 1 tablet by mouth every 6 (six) hours as needed. 120 tablet 1   No current facility-administered medications on file prior to visit.    Review of Systems   Constitutional: Negative for unusual diaphoresis or other sweats  HENT: Negative for ringing in ear Eyes: Negative for double vision or worsening visual disturbance.  Respiratory: Negative for choking and stridor.   Gastrointestinal: Negative for vomiting or other signifcant bowel change Genitourinary: Negative for hematuria or decreased urine volume.  Musculoskeletal: Negative for other MSK pain or swelling Skin: Negative for color change and worsening wound.  Neurological: Negative for tremors and numbness other  than noted  Psychiatric/Behavioral: Negative for decreased concentration or agitation other than above       Objective:   Physical Exam BP 132/72 mmHg  Pulse 87  Temp(Src) 98.1 F (36.7 C) (Oral)  Ht 5\' 5"  (1.651 m)  Wt 232 lb 12 oz (105.575 kg)  BMI 38.73 kg/m2  SpO2 94% VS noted,  Constitutional: Pt appears well-developed, well-nourished.  HENT: Head: NCAT.  Right Ear: External ear normal.  Left Ear: External ear normal.  Eyes: . Pupils are equal, round, and reactive to light. Conjunctivae and EOM are normal + Left TM with mod erythema, retraction Neck: Normal range of motion. Neck supple.  Cardiovascular: Normal rate and regular rhythm.   Pulmonary/Chest: Effort normal and breath sounds without rales or wheezing.  Neurological: Pt is alert. Not confused , motor grossly intact Skin: Skin is warm. No rash Psychiatric: Pt behavior is normal. No agitation.      Assessment & Plan:

## 2014-07-08 NOTE — Patient Instructions (Signed)
Please take all new medication as prescribed - the antibotic  You can also take Delsym OTC for cough, and/or Mucinex (or it's generic off brand) for congestion, and tylenol as needed for pain.  Please continue all other medications as before, and refills have been done if requested.  Please have the pharmacy call with any other refills you may need.  Please keep your appointments with your specialists as you may have planned  Please return in 6 months, or sooner if needed, with Lab testing done 3-5 days before

## 2014-07-08 NOTE — Assessment & Plan Note (Addendum)
C/w otitis media - Mild to mod, for antibx course,  to f/u any worsening symptoms or concerns, also for mucinex otc prn

## 2014-07-08 NOTE — Assessment & Plan Note (Signed)
stable overall by history and exam, recent data reviewed with pt, and pt to continue medical treatment as before,  to f/u any worsening symptoms or concerns Lab Results  Component Value Date   HGBA1C 6.0* 06/22/2014

## 2014-07-08 NOTE — Assessment & Plan Note (Signed)
Asympt, clinically resolved,  to f/u any worsening symptoms or concerns

## 2014-07-08 NOTE — Progress Notes (Signed)
Pre visit review using our clinic review tool, if applicable. No additional management support is needed unless otherwise documented below in the visit note. 

## 2014-07-08 NOTE — Assessment & Plan Note (Signed)
asympt since d/c, overall function no change, no worsening cp, sob/doe , no overt bleeding, to cont the asa 325 qod, declines lab today, f/u GI as recommended

## 2014-07-09 DIAGNOSIS — N183 Chronic kidney disease, stage 3 (moderate): Secondary | ICD-10-CM | POA: Diagnosis not present

## 2014-07-09 DIAGNOSIS — I5032 Chronic diastolic (congestive) heart failure: Secondary | ICD-10-CM | POA: Diagnosis not present

## 2014-07-09 DIAGNOSIS — I129 Hypertensive chronic kidney disease with stage 1 through stage 4 chronic kidney disease, or unspecified chronic kidney disease: Secondary | ICD-10-CM | POA: Diagnosis not present

## 2014-07-09 DIAGNOSIS — M6281 Muscle weakness (generalized): Secondary | ICD-10-CM | POA: Diagnosis not present

## 2014-07-09 DIAGNOSIS — M159 Polyosteoarthritis, unspecified: Secondary | ICD-10-CM | POA: Diagnosis not present

## 2014-07-09 DIAGNOSIS — K5790 Diverticulosis of intestine, part unspecified, without perforation or abscess without bleeding: Secondary | ICD-10-CM | POA: Diagnosis not present

## 2014-07-14 DIAGNOSIS — I5032 Chronic diastolic (congestive) heart failure: Secondary | ICD-10-CM | POA: Diagnosis not present

## 2014-07-14 DIAGNOSIS — K5790 Diverticulosis of intestine, part unspecified, without perforation or abscess without bleeding: Secondary | ICD-10-CM | POA: Diagnosis not present

## 2014-07-14 DIAGNOSIS — M159 Polyosteoarthritis, unspecified: Secondary | ICD-10-CM | POA: Diagnosis not present

## 2014-07-14 DIAGNOSIS — N183 Chronic kidney disease, stage 3 (moderate): Secondary | ICD-10-CM | POA: Diagnosis not present

## 2014-07-14 DIAGNOSIS — M6281 Muscle weakness (generalized): Secondary | ICD-10-CM | POA: Diagnosis not present

## 2014-07-14 DIAGNOSIS — I129 Hypertensive chronic kidney disease with stage 1 through stage 4 chronic kidney disease, or unspecified chronic kidney disease: Secondary | ICD-10-CM | POA: Diagnosis not present

## 2014-07-16 DIAGNOSIS — M159 Polyosteoarthritis, unspecified: Secondary | ICD-10-CM | POA: Diagnosis not present

## 2014-07-16 DIAGNOSIS — M6281 Muscle weakness (generalized): Secondary | ICD-10-CM | POA: Diagnosis not present

## 2014-07-16 DIAGNOSIS — K5790 Diverticulosis of intestine, part unspecified, without perforation or abscess without bleeding: Secondary | ICD-10-CM | POA: Diagnosis not present

## 2014-07-16 DIAGNOSIS — I5032 Chronic diastolic (congestive) heart failure: Secondary | ICD-10-CM | POA: Diagnosis not present

## 2014-07-16 DIAGNOSIS — N183 Chronic kidney disease, stage 3 (moderate): Secondary | ICD-10-CM | POA: Diagnosis not present

## 2014-07-16 DIAGNOSIS — I129 Hypertensive chronic kidney disease with stage 1 through stage 4 chronic kidney disease, or unspecified chronic kidney disease: Secondary | ICD-10-CM | POA: Diagnosis not present

## 2014-07-17 DIAGNOSIS — M159 Polyosteoarthritis, unspecified: Secondary | ICD-10-CM | POA: Diagnosis not present

## 2014-07-21 DIAGNOSIS — I129 Hypertensive chronic kidney disease with stage 1 through stage 4 chronic kidney disease, or unspecified chronic kidney disease: Secondary | ICD-10-CM | POA: Diagnosis not present

## 2014-07-21 DIAGNOSIS — K5790 Diverticulosis of intestine, part unspecified, without perforation or abscess without bleeding: Secondary | ICD-10-CM | POA: Diagnosis not present

## 2014-07-21 DIAGNOSIS — M6281 Muscle weakness (generalized): Secondary | ICD-10-CM | POA: Diagnosis not present

## 2014-07-21 DIAGNOSIS — M159 Polyosteoarthritis, unspecified: Secondary | ICD-10-CM | POA: Diagnosis not present

## 2014-07-21 DIAGNOSIS — I5032 Chronic diastolic (congestive) heart failure: Secondary | ICD-10-CM | POA: Diagnosis not present

## 2014-07-21 DIAGNOSIS — N183 Chronic kidney disease, stage 3 (moderate): Secondary | ICD-10-CM | POA: Diagnosis not present

## 2014-07-22 DIAGNOSIS — M159 Polyosteoarthritis, unspecified: Secondary | ICD-10-CM | POA: Diagnosis not present

## 2014-07-23 DIAGNOSIS — N183 Chronic kidney disease, stage 3 (moderate): Secondary | ICD-10-CM | POA: Diagnosis not present

## 2014-07-23 DIAGNOSIS — M159 Polyosteoarthritis, unspecified: Secondary | ICD-10-CM | POA: Diagnosis not present

## 2014-07-23 DIAGNOSIS — I129 Hypertensive chronic kidney disease with stage 1 through stage 4 chronic kidney disease, or unspecified chronic kidney disease: Secondary | ICD-10-CM | POA: Diagnosis not present

## 2014-07-23 DIAGNOSIS — M6281 Muscle weakness (generalized): Secondary | ICD-10-CM | POA: Diagnosis not present

## 2014-07-23 DIAGNOSIS — K5790 Diverticulosis of intestine, part unspecified, without perforation or abscess without bleeding: Secondary | ICD-10-CM | POA: Diagnosis not present

## 2014-07-23 DIAGNOSIS — I5032 Chronic diastolic (congestive) heart failure: Secondary | ICD-10-CM | POA: Diagnosis not present

## 2014-07-28 DIAGNOSIS — M6281 Muscle weakness (generalized): Secondary | ICD-10-CM | POA: Diagnosis not present

## 2014-07-28 DIAGNOSIS — M159 Polyosteoarthritis, unspecified: Secondary | ICD-10-CM | POA: Diagnosis not present

## 2014-07-28 DIAGNOSIS — N183 Chronic kidney disease, stage 3 (moderate): Secondary | ICD-10-CM | POA: Diagnosis not present

## 2014-07-28 DIAGNOSIS — K5790 Diverticulosis of intestine, part unspecified, without perforation or abscess without bleeding: Secondary | ICD-10-CM | POA: Diagnosis not present

## 2014-07-28 DIAGNOSIS — I129 Hypertensive chronic kidney disease with stage 1 through stage 4 chronic kidney disease, or unspecified chronic kidney disease: Secondary | ICD-10-CM | POA: Diagnosis not present

## 2014-07-28 DIAGNOSIS — I5032 Chronic diastolic (congestive) heart failure: Secondary | ICD-10-CM | POA: Diagnosis not present

## 2014-08-22 ENCOUNTER — Telehealth: Payer: Self-pay

## 2014-08-25 NOTE — Telephone Encounter (Signed)
Late entry of call placed 06/23/2014 and patient declined flu vaccine.

## 2014-09-11 DIAGNOSIS — Z853 Personal history of malignant neoplasm of breast: Secondary | ICD-10-CM | POA: Diagnosis not present

## 2014-09-30 ENCOUNTER — Other Ambulatory Visit: Payer: Self-pay | Admitting: Internal Medicine

## 2014-10-21 ENCOUNTER — Other Ambulatory Visit: Payer: Self-pay | Admitting: Internal Medicine

## 2014-10-21 NOTE — Telephone Encounter (Signed)
Rx sent to pharmacy   

## 2014-10-22 IMAGING — CR DG CHEST 1V PORT
1 series · 1 of 1 positions shown · non-contrast
Comparison: Chest x-ray 08/25/2013.

CLINICAL DATA: Respiratory failure.

EXAM:
PORTABLE CHEST - 1 VIEW

[AP]
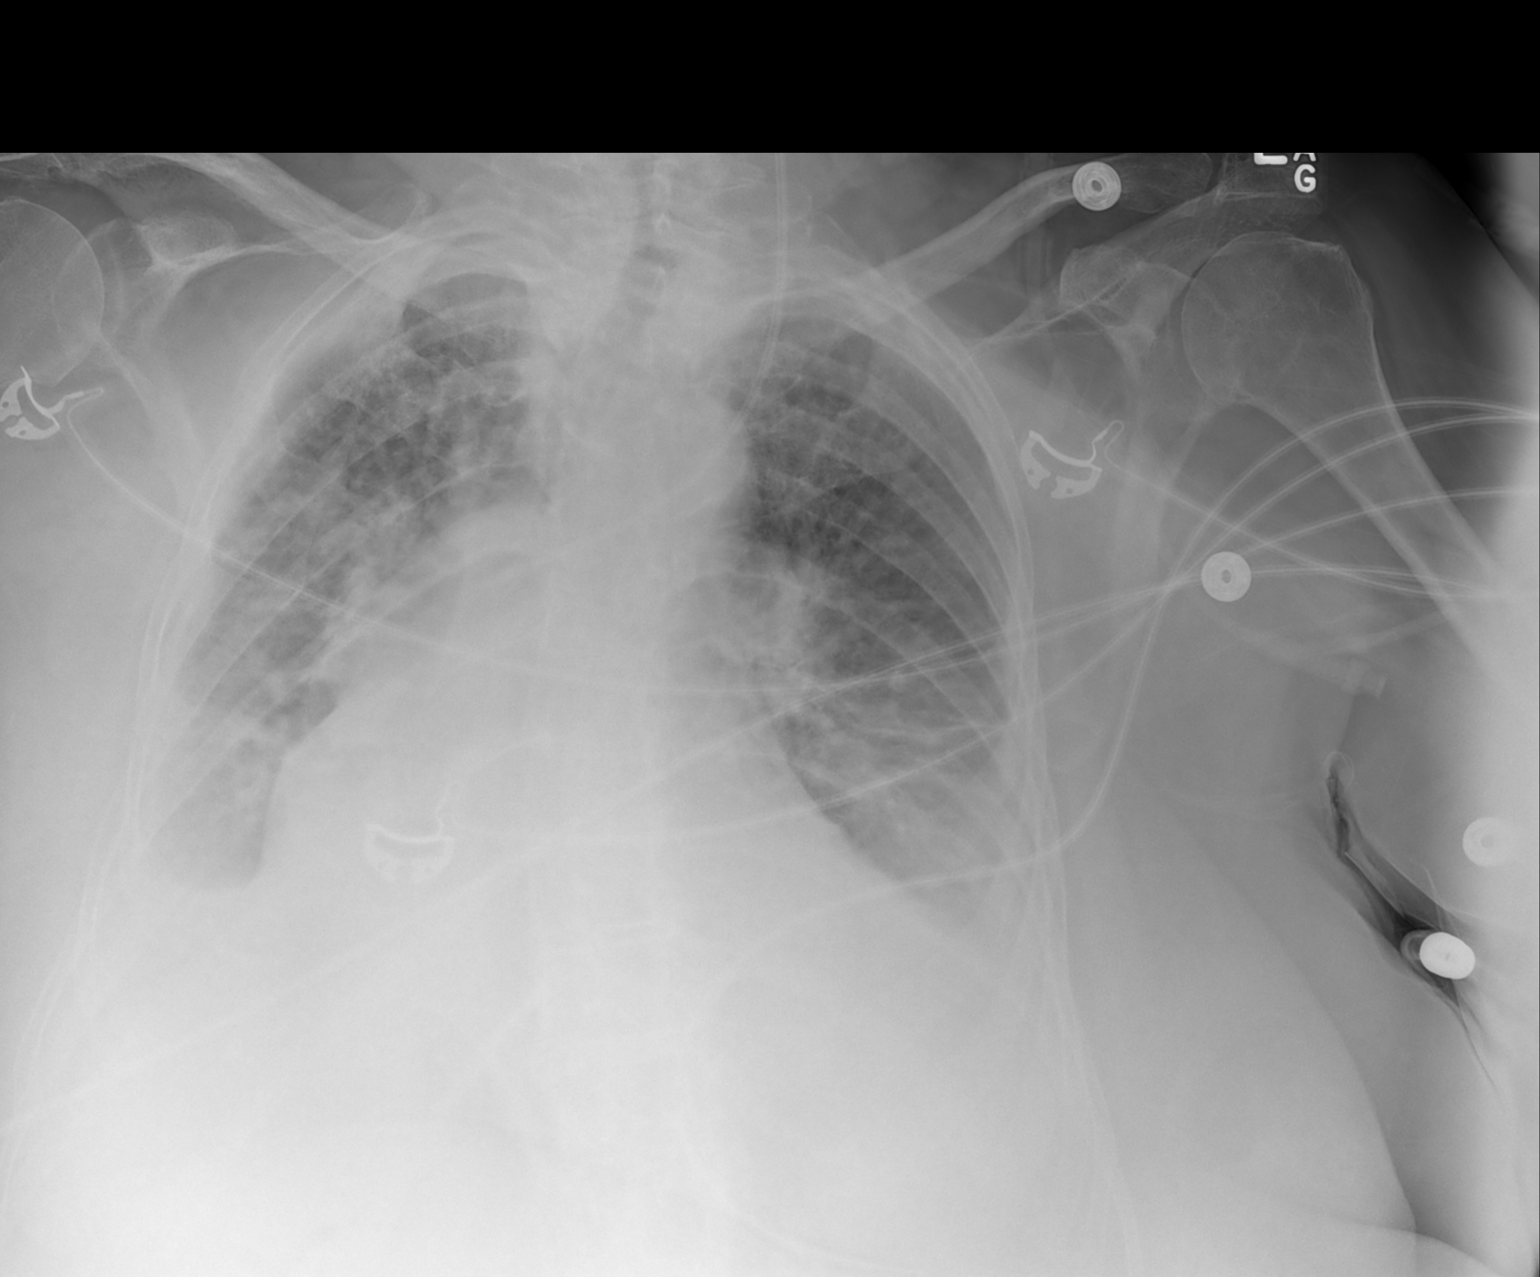

[1 of 1 positions shown; findings below may reference images not displayed]

FINDINGS: There is a left-sided internal jugular central venous catheter with
tip terminating in the mid superior vena cava. Lung volumes are low.
There is cephalization of the pulmonary vasculature and slight
indistinctness of the interstitial markings suggestive of mild
pulmonary edema. Small to moderate bilateral pleural effusions. Mild
cardiomegaly. The patient is rotated to the left on today's exam,
resulting in distortion of the mediastinal contours and reduced
diagnostic sensitivity and specificity for mediastinal pathology.
Atherosclerosis in the thoracic aorta.
IMPRESSION: 1. Support apparatus, as above.
2. Low lung volumes with evidence of congestive heart failure, as
above.

## 2014-10-23 IMAGING — CR DG CHEST 1V PORT
1 series · 1 of 1 positions shown · non-contrast
Comparison: DG CHEST 1V PORT dated 08/26/2013

CLINICAL DATA: Respiratory failure and dyspnea

EXAM:
PORTABLE CHEST - 1 VIEW

[AP]
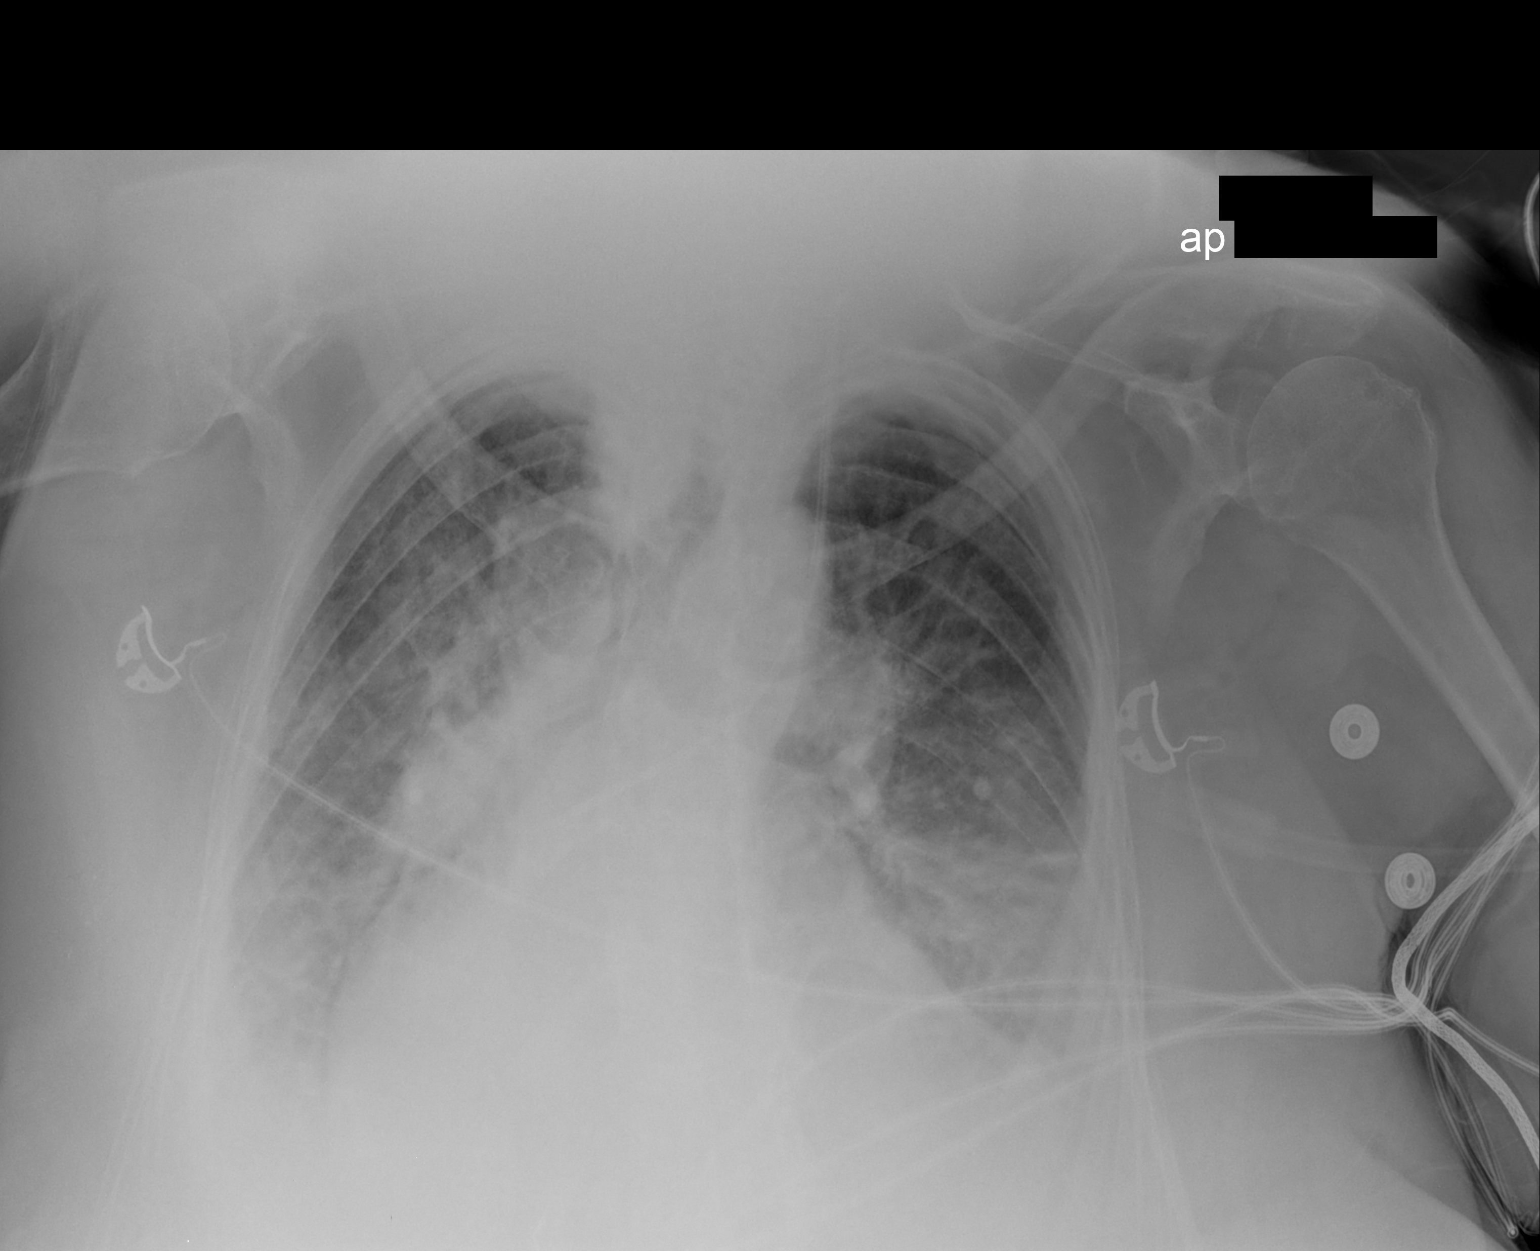

[1 of 1 positions shown; findings below may reference images not displayed]

FINDINGS: The lungs are reasonably well inflated. The interstitial markings
remain increased but are slightly less conspicuous today. The
hemidiaphragms remain obscured. The cardiopericardial silhouette
remains enlarged and the pulmonary vascularity remains engorged. The
pulmonary interstitial markings are perhaps slightly less
conspicuous today. The left internal jugular venous catheter tip
lies in the region of the proximal SVC.
IMPRESSION: There has been slight interval improvement in the appearance of the
pulmonary interstitium since yesterday's study suggesting interval
decrease in interstitial edema. Considerable abnormality remains
however.

## 2014-11-25 ENCOUNTER — Ambulatory Visit (INDEPENDENT_AMBULATORY_CARE_PROVIDER_SITE_OTHER): Payer: Medicare Other | Admitting: Internal Medicine

## 2014-11-25 ENCOUNTER — Encounter: Payer: Self-pay | Admitting: Internal Medicine

## 2014-11-25 VITALS — BP 142/80 | HR 80 | Temp 98.1°F | Ht 63.0 in | Wt 237.0 lb

## 2014-11-25 DIAGNOSIS — E119 Type 2 diabetes mellitus without complications: Secondary | ICD-10-CM

## 2014-11-25 DIAGNOSIS — I10 Essential (primary) hypertension: Secondary | ICD-10-CM

## 2014-11-25 DIAGNOSIS — K648 Other hemorrhoids: Secondary | ICD-10-CM | POA: Diagnosis not present

## 2014-11-25 DIAGNOSIS — K644 Residual hemorrhoidal skin tags: Secondary | ICD-10-CM

## 2014-11-25 NOTE — Progress Notes (Signed)
Pre visit review using our clinic review tool, if applicable. No additional management support is needed unless otherwise documented below in the visit note. 

## 2014-11-25 NOTE — Patient Instructions (Signed)
Please continue all other medications as before, and refills have been done if requested.  Please have the pharmacy call with any other refills you may need.  Please continue your efforts at being more active, low cholesterol diet, and weight control.  Please keep your appointments with your specialists as you may have planned     

## 2014-11-25 NOTE — Assessment & Plan Note (Signed)
D/w pt, no evidence for abscess, ok for conservative management, pt reassured

## 2014-11-25 NOTE — Progress Notes (Signed)
Subjective:    Patient ID: [name], female    DOB: April 03, 1930, 81 y.o.   MRN: [id number]  HPI  Here with 3 days acute onset swelling area right anal region, initially with some discomfort but none today after cocoa butter application.  Denies worsening reflux, abd pain, dysphagia, n/v, bowel change or blood.  Pt denies chest pain, increased sob or doe, wheezing, orthopnea, PND, increased LE swelling, palpitations, dizziness or syncope.  Pt denies new neurological symptoms such as new headache, or facial or extremity weakness or numbness   Pt denies polydipsia, polyuria, Past Medical History  Diagnosis Date  . Hypertension   . Hyperlipidemia   . Morbid obesity   . Osteoarthritis   . Gout   . CVA (cerebral infarction)   . IBS (irritable bowel syndrome)   . Diverticulosis   . Esophageal stricture   . Colon polyps     hyperplastic  . Anxiety   . Hiatal hernia     4 cm  . Shingles 02/21/2011  . Skin cancer   . Breast cancer     right breast  . Dysrhythmia     dr bensimhon   . Blood transfusion   . Chronic kidney disease     occ uti's  . GERD (gastroesophageal reflux disease)   . PONV (postoperative nausea and vomiting)   . DVT of leg (deep venous thrombosis)     left; S/P OR  . Atrial flutter     "sometimes"  . Bronchitis   . Pneumonia     "walking"  . Shortness of breath on exertion   . Anemia   . Stroke 2002    residual:  "little tingling in palm of left hand"  . Kidney stones 1990's  . UTI (lower urinary tract infection)     "I've had a few"  . History of radiation therapy 02/15/12-04/02/12    right breast 4680 cGy/26 sessions, right boost=1400cGy /7 sessions   Past Surgical History  Procedure Laterality Date  . Abdominal hysterectomy    . Bile duct stent placement  ~ 01/2011  . Pelvic bone tumor removal      twice in 2000's  . Colonic perforation repair  ~ 2000  . Total hip arthroplasty  1980's    right  . Joint replacement    . Colon surgery      hole  in intestine ,tumors?  . Tonsillectomy  ~ 1946  . Cholecystectomy  03/23/11    lap chole   . Replacement total knee bilateral  ~ 2008; ~ 2010    right; left  . Cataract extraction w/ intraocular lens  implant, bilateral  1990's  . Hemorrhoid surgery  2012  . Breast lumpectomy  06/29/11    right  . Breast lumpectomy  06/29/2011    Procedure: BREAST LUMPECTOMY WITH EXCISION OF SENTINEL NODE;  Surgeon: Pedro Earls, MD;  Location: [facility];  Service: General;  Laterality: Right;  right sentinel node mapping,right sentinel node biopsy, needle localization right breast lumpectomy  . Incision and drainage breast abscess      right breast seroma  . Incision and drainage abscess N/A 08/12/2013    Procedure: INCISION AND DRAINAGE ABSCESS;  Surgeon: Earnstine Regal, MD;  Location: [facility];  Service: General;  Laterality: N/A;  . Irrigation and debridement abscess N/A 08/14/2013    Procedure: Debridment of perineal tissue and debridement of perineal wound;  Surgeon: Earnstine Regal, MD;  Location: [facility];  Service:  General;  Laterality: N/A;  . Irrigation and debridement abscess N/A 08/16/2013    Procedure: DRESSING CHANGE AND DEBRIDEMENT OF PERINEUM WOUND;  Surgeon: Earnstine Regal, MD;  Location: [facility];  Service: General;  Laterality: N/A;  . Pilonidal cyst drainage N/A 08/19/2013    Procedure: Dressing change perineum;  Surgeon: Ralene Ok, MD;  Location: [facility];  Service: General;  Laterality: N/A;  . Minor application of wound vac N/A 08/20/2013    Procedure: MINOR APPLICATION OF WOUND VAC;  Surgeon: Ralene Ok, MD;  Location: [facility];  Service: General;  Laterality: N/A;  . Incision and drainage abscess N/A 08/20/2013    Procedure: INCISION AND DRAINAGE ABSCESS;  Surgeon: Ralene Ok, MD;  Location: [facility];  Service: General;  Laterality: N/A;    reports that she has never smoked. She has never used smokeless tobacco. She reports that she does not drink alcohol or use illicit drugs. family history  includes Breast cancer in her sister; Diabetes in her sister; Heart disease in her maternal uncle and paternal uncle; Kidney failure in her sister; Prostate cancer in her brother; Stroke in her mother. There is no history of Colon cancer. Allergies  Allergen Reactions  . Ace Inhibitors Other (See Comments)    Tolerates lisinopril at home. Pt does not recall allergy.  . Atorvastatin     REACTION: myalgias  . Oxycodone-Acetaminophen     REACTION: confusion, fatigue  . Rosuvastatin     REACTION: leg weakness  . Simvastatin     REACTION: leg weakness  . Codeine Nausea And Vomiting and Rash  . Sulfonamide Derivatives Hives and Rash   Current Outpatient Prescriptions on File Prior to Visit  Medication Sig Dispense Refill  . allopurinol (ZYLOPRIM) 100 MG tablet Take 100-200 mg by mouth every other day. Take 100 mg and then 200 mg on alternating days.    Marland Kitchen anastrozole (ARIMIDEX) 1 MG tablet Take 1 mg by mouth daily.    Marland Kitchen aspirin 325 MG EC tablet Take 1 tablet (325 mg total) by mouth every other day.    . cholecalciferol (VITAMIN D) 1000 UNITS tablet Take 1,000 Units by mouth every morning.     . furosemide (LASIX) 40 MG tablet TAKE ONE TABLET BY MOUTH IN THE MORNING 30 tablet 5  . lisinopril (PRINIVIL,ZESTRIL) 20 MG tablet Take 40 mg by mouth daily.    . pantoprazole (PROTONIX) 40 MG tablet Take 1 tablet (40 mg total) by mouth daily. 30 tablet 0  . traMADol-acetaminophen (ULTRACET) 37.5-325 MG per tablet TAKE ONE TABLET BY MOUTH EVERY 6 HOURS AS NEEDED 120 tablet 1  . azithromycin (ZITHROMAX Z-PAK) 250 MG tablet Use as directed (Patient not taking: Reported on 11/25/2014) 6 tablet 1   No current facility-administered medications on file prior to visit.   Review of Systems  Constitutional: Negative for unusual diaphoresis or night sweats HENT: Negative for ringing in ear or discharge Eyes: Negative for double vision or worsening visual disturbance.  Respiratory: Negative for choking and  stridor.   Gastrointestinal: Negative for vomiting or other signifcant bowel change Genitourinary: Negative for hematuria or change in urine volume.  Musculoskeletal: Negative for other MSK pain or swelling Skin: Negative for color change and worsening wound.  Neurological: Negative for tremors and numbness other than noted  Psychiatric/Behavioral: Negative for decreased concentration or agitation other than above       Objective:   Physical Exam BP 142/80 mmHg  Pulse 80  Temp(Src) 98.1 F (36.7 C) (Oral)  Ht 5\' 3"  (1.6 m)  Wt 237 lb (107.502 kg)  BMI 41.99 kg/m2  SpO2 96% VS noted,  Constitutional: Pt appears in no significant distress HENT: Head: NCAT.  Right Ear: External ear normal.  Left Ear: External ear normal.  Eyes: . Pupils are equal, round, and reactive to light. Conjunctivae and EOM are normal Neck: Normal range of motion. Neck supple.  Cardiovascular: Normal rate and regular rhythm.   Pulmonary/Chest: Effort normal and breath sounds without rales or wheezing.  Abd:  Soft, NT, ND, + BS Neurological: Pt is alert. Not confused , motor grossly intact Skin: Skin is warm. No rash, no LE edema Psychiatric: Pt behavior is normal. No agitation.  Rectal: 1 cm area nontender thrombosed ext hemorrhoid 4oclock, no bleeding, minimal erythema    Assessment & Plan:

## 2014-11-25 NOTE — Assessment & Plan Note (Signed)
stable overall by history and exam, recent data reviewed with pt, and pt to continue medical treatment as before,  to f/u any worsening symptoms or concerns Lab Results  Component Value Date   HGBA1C 6.0* 06/22/2014

## 2014-11-25 NOTE — Assessment & Plan Note (Signed)
stable overall by history and exam, recent data reviewed with pt, and pt to continue medical treatment as before,  to f/u any worsening symptoms or concerns BP Readings from Last 3 Encounters:  11/25/14 142/80  07/08/14 132/72  06/23/14 160/60

## 2014-12-08 ENCOUNTER — Telehealth: Payer: Self-pay | Admitting: Internal Medicine

## 2014-12-08 ENCOUNTER — Other Ambulatory Visit: Payer: Medicare Other

## 2014-12-08 ENCOUNTER — Telehealth: Payer: Self-pay | Admitting: Emergency Medicine

## 2014-12-08 DIAGNOSIS — R300 Dysuria: Secondary | ICD-10-CM

## 2014-12-08 NOTE — Telephone Encounter (Signed)
Instructed pt to go down to the lab to perform urinalysis.

## 2014-12-08 NOTE — Telephone Encounter (Signed)
Patient has uti and I have made an appointment tomorrow with Dr. Jenny Reichmann. She is wondering if she can come in today to do her urinalysis. Please advise

## 2014-12-09 ENCOUNTER — Encounter: Payer: Self-pay | Admitting: Internal Medicine

## 2014-12-09 ENCOUNTER — Ambulatory Visit (INDEPENDENT_AMBULATORY_CARE_PROVIDER_SITE_OTHER): Payer: Medicare Other | Admitting: Internal Medicine

## 2014-12-09 VITALS — BP 146/82 | HR 75 | Temp 98.0°F | Ht 65.0 in | Wt 237.0 lb

## 2014-12-09 DIAGNOSIS — I10 Essential (primary) hypertension: Secondary | ICD-10-CM

## 2014-12-09 DIAGNOSIS — E119 Type 2 diabetes mellitus without complications: Secondary | ICD-10-CM | POA: Diagnosis not present

## 2014-12-09 DIAGNOSIS — R300 Dysuria: Secondary | ICD-10-CM | POA: Diagnosis not present

## 2014-12-09 LAB — POCT URINALYSIS DIPSTICK
Bilirubin, UA: NEGATIVE
Blood, UA: NEGATIVE
Glucose, UA: NEGATIVE
Ketones, UA: NEGATIVE
Nitrite, UA: NEGATIVE
Protein, UA: NEGATIVE
Spec Grav, UA: 1.025
Urobilinogen, UA: 0.2
pH, UA: 6

## 2014-12-09 MED ORDER — CEPHALEXIN 500 MG PO CAPS: 500.0000 mg | ORAL_CAPSULE | Freq: Four times a day (QID) | ORAL | Status: DC

## 2014-12-09 NOTE — Progress Notes (Signed)
Subjective:    Patient ID: Bonnetta Barry, female    DOB: [birth year], 81 y.o.   MRN: [id number]  HPI  Here to c/o 3 days onset worsening dysuuria and back pain with nausea, Denies urinary symptoms such as frequency, urgency, flank pain, hematuria fever, chills. Pt denies chest pain, increased sob or doe, wheezing, orthopnea, PND, increased LE swelling, palpitations, dizziness or syncope.  Pt denies new neurological symptoms such as new headache, or facial or extremity weakness or numbness. Has already given sample for culture yesterday, but no results yet Past Medical History  Diagnosis Date  . Hypertension   . Hyperlipidemia   . Morbid obesity   . Osteoarthritis   . Gout   . CVA (cerebral infarction)   . IBS (irritable bowel syndrome)   . Diverticulosis   . Esophageal stricture   . Colon polyps     hyperplastic  . Anxiety   . Hiatal hernia     4 cm  . Shingles 02/21/2011  . Skin cancer   . Breast cancer     right breast  . Dysrhythmia     dr bensimhon   . Blood transfusion   . Chronic kidney disease     occ uti's  . GERD (gastroesophageal reflux disease)   . PONV (postoperative nausea and vomiting)   . DVT of leg (deep venous thrombosis)     left; S/P OR  . Atrial flutter     "sometimes"  . Bronchitis   . Pneumonia     "walking"  . Shortness of breath on exertion   . Anemia   . Stroke 2002    residual:  "little tingling in palm of left hand"  . Kidney stones 1990's  . UTI (lower urinary tract infection)     "I've had a few"  . History of radiation therapy 02/15/12-04/02/12    right breast 4680 cGy/26 sessions, right boost=1400cGy /7 sessions   Past Surgical History  Procedure Laterality Date  . Abdominal hysterectomy    . Bile duct stent placement  ~ 01/2011  . Pelvic bone tumor removal      twice in 2000's  . Colonic perforation repair  ~ 2000  . Total hip arthroplasty  1980's    right  . Joint replacement    . Colon surgery      hole in intestine ,tumors?    . Tonsillectomy  ~ 1946  . Cholecystectomy  03/23/11    lap chole   . Replacement total knee bilateral  ~ 2008; ~ 2010    right; left  . Cataract extraction w/ intraocular lens  implant, bilateral  1990's  . Hemorrhoid surgery  2012  . Breast lumpectomy  06/29/11    right  . Breast lumpectomy  06/29/2011    Procedure: BREAST LUMPECTOMY WITH EXCISION OF SENTINEL NODE;  Surgeon: Pedro Earls, MD;  Location: [facility];  Service: General;  Laterality: Right;  right sentinel node mapping,right sentinel node biopsy, needle localization right breast lumpectomy  . Incision and drainage breast abscess      right breast seroma  . Incision and drainage abscess N/A 08/12/2013    Procedure: INCISION AND DRAINAGE ABSCESS;  Surgeon: Earnstine Regal, MD;  Location: [facility];  Service: General;  Laterality: N/A;  . Irrigation and debridement abscess N/A 08/14/2013    Procedure: Debridment of perineal tissue and debridement of perineal wound;  Surgeon: Earnstine Regal, MD;  Location: [facility];  Service: General;  Laterality: N/A;  .  Irrigation and debridement abscess N/A 08/16/2013    Procedure: DRESSING CHANGE AND DEBRIDEMENT OF PERINEUM WOUND;  Surgeon: Earnstine Regal, MD;  Location[facility];  Service: General;  Laterality: N/A;  . Pilonidal cyst drainage N/A 08/19/2013    Procedure: Dressing change perineum;  Surgeon: Ralene Ok, MD;  Location[facility];  Service: General;  Laterality: N/A;  . Minor application of wound vac N/A 08/20/2013    Procedure: MINOR APPLICATION OF WOUND VAC;  Surgeon: Ralene Ok, MD;  Location[facility];  Service: General;  Laterality: N/A;  . Incision and drainage abscess N/A 08/20/2013    Procedure: INCISION AND DRAINAGE ABSCESS;  Surgeon: Ralene Ok, MD;  Location[facility];  Service: General;  Laterality: N/A;    reports that she has never smoked. She has never used smokeless tobacco. She reports that she does not drink alcohol or use illicit drugs. family history includes Breast  cancer in her sister; Diabetes in her sister; Heart disease in her maternal uncle and paternal uncle; Kidney failure in her sister; Prostate cancer in her brother; Stroke in her mother. There is no history of Colon cancer. Allergies  Allergen Reactions  . Ace Inhibitors Other (See Comments)    Tolerates lisinopril at home. Pt does not recall allergy.  . Atorvastatin     REACTION: myalgias  . Oxycodone-Acetaminophen     REACTION: confusion, fatigue  . Rosuvastatin     REACTION: leg weakness  . Simvastatin     REACTION: leg weakness  . Codeine Nausea And Vomiting and Rash  . Sulfonamide Derivatives Hives and Rash   Current Outpatient Prescriptions on File Prior to Visit  Medication Sig Dispense Refill  . allopurinol (ZYLOPRIM) 100 MG tablet Take 100-200 mg by mouth every other day. Take 100 mg and then 200 mg on alternating days.    Marland Kitchen anastrozole (ARIMIDEX) 1 MG tablet Take 1 mg by mouth daily.    Marland Kitchen aspirin 325 MG EC tablet Take 1 tablet (325 mg total) by mouth every other day.    . cholecalciferol (VITAMIN D) 1000 UNITS tablet Take 1,000 Units by mouth every morning.     . furosemide (LASIX) 40 MG tablet TAKE ONE TABLET BY MOUTH IN THE MORNING 30 tablet 5  . lisinopril (PRINIVIL,ZESTRIL) 20 MG tablet Take 40 mg by mouth daily.    . pantoprazole (PROTONIX) 40 MG tablet Take 1 tablet (40 mg total) by mouth daily. 30 tablet 0  . traMADol-acetaminophen (ULTRACET) 37.5-325 MG per tablet TAKE ONE TABLET BY MOUTH EVERY 6 HOURS AS NEEDED 120 tablet 1  . azithromycin (ZITHROMAX Z-PAK) 250 MG tablet Use as directed (Patient not taking: Reported on 11/25/2014) 6 tablet 1   No current facility-administered medications on file prior to visit.    Review of Systems  Constitutional: Negative for unusual diaphoresis or night sweats HENT: Negative for ringing in ear or discharge Eyes: Negative for double vision or worsening visual disturbance.  Respiratory: Negative for choking and stridor.    Gastrointestinal: Negative for vomiting or other signifcant bowel change Genitourinary: Negative for hematuria or change in urine volume.  Musculoskeletal: Negative for other MSK pain or swelling Skin: Negative for color change and worsening wound.  Neurological: Negative for tremors and numbness other than noted  Psychiatric/Behavioral: Negative for decreased concentration or agitation other than above       Objective:   Physical Exam BP 146/82 mmHg  Pulse 75  Temp(Src) 98 F (36.7 C) (Oral)  Ht 5\' 5"  (1.651 m)  Wt 237 lb (107.502 kg)  BMI 39.44 kg/m2  SpO2 96% VS noted,  Constitutional: Pt appears in no significant distress HENT: Head: NCAT.  Right Ear: External ear normal.  Left Ear: External ear normal.  Eyes: . Pupils are equal, round, and reactive to light. Conjunctivae and EOM are normal Neck: Normal range of motion. Neck supple.  Cardiovascular: Normal rate and regular rhythm.   Pulmonary/Chest: Effort normal and breath sounds without rales or wheezing.  Abd:  Soft, NT, ND, + BS, no flank tender Neurological: Pt is alert. Not confused , motor grossly intact Skin: Skin is warm. No rash, no LE edema Psychiatric: Pt behavior is normal. No agitation.     Assessment & Plan:

## 2014-12-09 NOTE — Assessment & Plan Note (Signed)
stable overall by history and exam, recent data reviewed with pt, and pt to continue medical treatment as before,  to f/u any worsening symptoms or concerns Lab Results  Component Value Date   HGBA1C 6.0* 06/22/2014

## 2014-12-09 NOTE — Assessment & Plan Note (Signed)
stable overall by history and exam, recent data reviewed with pt, and pt to continue medical treatment as before,  to f/u any worsening symptoms or concerns BP Readings from Last 3 Encounters:  12/09/14 146/82  11/25/14 142/80  07/08/14 132/72

## 2014-12-09 NOTE — Assessment & Plan Note (Signed)
prob UTI = for empiric antibx, f/u culture results, UA dip reveiwed with pt in the office today,  to f/u any worsening symptoms or concerns

## 2014-12-09 NOTE — Patient Instructions (Addendum)
Please take all new medication as prescribed  Please continue all other medications as before, and refills have been done if requested.  Please have the pharmacy call with any other refills you may need.  Please continue your efforts at being more active, low cholesterol diet, and weight control.  Please keep your appointments with your specialists as you may have planned  We will need to follow your culture results

## 2014-12-10 LAB — URINE CULTURE: Colony Count: >=100000

## 2014-12-11 ENCOUNTER — Encounter: Payer: Self-pay | Admitting: Internal Medicine

## 2014-12-30 ENCOUNTER — Other Ambulatory Visit: Payer: Self-pay | Admitting: Internal Medicine

## 2015-02-17 ENCOUNTER — Other Ambulatory Visit: Payer: Self-pay | Admitting: Internal Medicine

## 2015-02-23 ENCOUNTER — Other Ambulatory Visit: Payer: Medicare Other

## 2015-02-23 ENCOUNTER — Ambulatory Visit: Payer: Medicare Other | Admitting: Oncology

## 2015-02-23 ENCOUNTER — Ambulatory Visit: Payer: Medicare Other | Admitting: Nurse Practitioner

## 2015-03-11 DIAGNOSIS — C50911 Malignant neoplasm of unspecified site of right female breast: Secondary | ICD-10-CM | POA: Diagnosis not present

## 2015-04-01 ENCOUNTER — Other Ambulatory Visit: Payer: Self-pay | Admitting: Internal Medicine

## 2015-04-15 ENCOUNTER — Other Ambulatory Visit: Payer: Self-pay

## 2015-04-15 MED ORDER — ALLOPURINOL 100 MG PO TABS: 100.0000 mg | ORAL_TABLET | ORAL | Status: DC

## 2015-05-18 ENCOUNTER — Other Ambulatory Visit: Payer: Self-pay | Admitting: Oncology

## 2015-05-28 ENCOUNTER — Other Ambulatory Visit: Payer: Self-pay | Admitting: Internal Medicine

## 2015-05-28 ENCOUNTER — Other Ambulatory Visit: Payer: Self-pay | Admitting: Surgery

## 2015-05-28 DIAGNOSIS — Z9889 Other specified postprocedural states: Secondary | ICD-10-CM

## 2015-06-16 ENCOUNTER — Ambulatory Visit
Admission: RE | Admit: 2015-06-16 | Discharge: 2015-06-16 | Disposition: A | Discharge status: Home / Self Care | Payer: Medicare Other | Source: Ambulatory Visit | Attending: Surgery | Admitting: Surgery

## 2015-06-16 DIAGNOSIS — R928 Other abnormal and inconclusive findings on diagnostic imaging of breast: Secondary | ICD-10-CM | POA: Diagnosis not present

## 2015-06-16 DIAGNOSIS — Z9889 Other specified postprocedural states: Secondary | ICD-10-CM

## 2015-06-27 ENCOUNTER — Other Ambulatory Visit: Payer: Self-pay | Admitting: Internal Medicine

## 2015-07-04 ENCOUNTER — Ambulatory Visit (INDEPENDENT_AMBULATORY_CARE_PROVIDER_SITE_OTHER): Payer: Medicare Other | Admitting: Family Medicine

## 2015-07-04 VITALS — BP 140/70 | HR 88 | Temp 98.1°F | Resp 18 | Ht 65.0 in | Wt 235.8 lb

## 2015-07-04 DIAGNOSIS — J069 Acute upper respiratory infection, unspecified: Secondary | ICD-10-CM | POA: Diagnosis not present

## 2015-07-04 MED ORDER — BENZONATATE 100 MG PO CAPS: 100.0000 mg | ORAL_CAPSULE | Freq: Three times a day (TID) | ORAL | Status: DC | PRN

## 2015-07-04 NOTE — Patient Instructions (Signed)
INSTRUCTIONS FOR UPPER RESPIRATORY INFECTION:  -plenty of rest and fluids  -nasal saline wash 2-3 times daily (use prepackaged nasal saline or bottled/distilled water if making your own)   -can use AFRIN nasal spray for drainage and nasal congestion - but do NOT use longer then 3-4 days  -in the winter time, using a humidifier at night is helpful (please follow cleaning instructions)  -if you are taking a cough medication - use only as directed, may also try a teaspoon of honey to coat the throat and throat lozenges. If given a cough medication with codeine or hydrocodone or other narcotic please be advised that this contains a strong and  potentially addicting medication. Please follow instructions carefully, take as little as possible and only use AS NEEDED for severe cough. Discuss potential side effects with your pharmacy. Please do not drive or operate machinery while taking these types of medications. Please do not take other sedating medications, drugs or alcohol while taking this medication without discussing with your doctor.  -for sore throat, salt water gargles can help  -follow up if you have fevers, facial pain, tooth pain, difficulty breathing or are worsening or symptoms persist longer then expected  Upper Respiratory Infection, Adult An upper respiratory infection (URI) is also known as the common cold. It is often caused by a type of germ (virus). Colds are easily spread (contagious). You can pass it to others by kissing, coughing, sneezing, or drinking out of the same glass. Usually, you get better in 1 to 3  weeks.  However, the cough can last for even longer. HOME CARE   Only take medicine as told by your doctor. Follow instructions provided above.  Drink enough water and fluids to keep your pee (urine) clear or pale yellow.  Get plenty of rest.  Return to work when your temperature is < 100 for 24 hours or as told by your doctor. You may use a face mask and wash your  hands to stop your cold from spreading. GET HELP RIGHT AWAY IF:   After the first few days, you feel you are getting worse.  You have questions about your medicine.  You have chills, shortness of breath, or red spit (mucus).  You have pain in the face for more then 1-2 days, especially when you bend forward.  You have a fever, puffy (swollen) neck, pain when you swallow, or white spots in the back of your throat.  You have a bad headache, ear pain, sinus pain, or chest pain.  You have a high-pitched whistling sound when you breathe in and out (wheezing).  You cough up blood.  You have sore muscles or a stiff neck. MAKE SURE YOU:   Understand these instructions.  Will watch your condition.  Will get help right away if you are not doing well or get worse. Document Released: 11/16/2007 Document Revised: 08/22/2011 Document Reviewed: 09/04/2013 [facility] Patient Information 2015 [place], [place]. This information is not intended to replace advice given to you by your health care provider. Make sure you discuss any questions you have with your health care provider.

## 2015-07-04 NOTE — Progress Notes (Signed)
Pre-visit discussion using our clinic review tool. No additional management support is needed unless otherwise documented below in the visit note.  

## 2015-07-04 NOTE — Progress Notes (Signed)
HPI:  Acute visit for Sinus congestion: -started: yesterday -symptoms:nasal congestion, scratchy throat, cough -denies:fever, SOB, NVD, tooth pain, sinus pain -has tried: nothing -sick contacts/travel/risks: denies flu exposure or known sick contacts  ROS: See pertinent positives and negatives per HPI.  Past Medical History  Diagnosis Date  . Hypertension   . Hyperlipidemia   . Morbid obesity   . Osteoarthritis   . Gout   . CVA (cerebral infarction)   . IBS (irritable bowel syndrome)   . Diverticulosis   . Esophageal stricture   . Colon polyps     hyperplastic  . Anxiety   . Hiatal hernia     4 cm  . Shingles 02/21/2011  . Skin cancer   . Breast cancer     right breast  . Dysrhythmia     dr bensimhon   . Blood transfusion   . Chronic kidney disease     occ uti's  . GERD (gastroesophageal reflux disease)   . PONV (postoperative nausea and vomiting)   . DVT of leg (deep venous thrombosis)     left; S/P OR  . Atrial flutter     "sometimes"  . Bronchitis   . Pneumonia     "walking"  . Shortness of breath on exertion   . Anemia   . Stroke 2002    residual:  "little tingling in palm of left hand"  . Kidney stones 1990's  . UTI (lower urinary tract infection)     "I've had a few"  . History of radiation therapy 02/15/12-04/02/12    right breast 4680 cGy/26 sessions, right boost=1400cGy /7 sessions    Past Surgical History  Procedure Laterality Date  . Abdominal hysterectomy    . Bile duct stent placement  ~ 01/2011  . Pelvic bone tumor removal      twice in 2000's  . Colonic perforation repair  ~ 2000  . Total hip arthroplasty  1980's    right  . Joint replacement    . Colon surgery      hole in intestine ,tumors?  . Tonsillectomy  ~ 1946  . Cholecystectomy  03/23/11    lap chole   . Replacement total knee bilateral  ~ 2008; ~ 2010    right; left  . Cataract extraction w/ intraocular lens  implant, bilateral  1990's  . Hemorrhoid surgery  2012  .  Breast lumpectomy  06/29/11    right  . Breast lumpectomy  06/29/2011    Procedure: BREAST LUMPECTOMY WITH EXCISION OF SENTINEL NODE;  Surgeon: Pedro Earls, MD;  Location: [facility];  Service: General;  Laterality: Right;  right sentinel node mapping,right sentinel node biopsy, needle localization right breast lumpectomy  . Incision and drainage breast abscess      right breast seroma  . Incision and drainage abscess N/A 08/12/2013    Procedure: INCISION AND DRAINAGE ABSCESS;  Surgeon: Earnstine Regal, MD;  [facility];  Service: General;  Laterality: N/A;  . Irrigation and debridement abscess N/A 08/14/2013    Procedure: Debridment of perineal tissue and debridement of perineal wound;  Surgeon: Earnstine Regal, MD;  [facility];  Service: General;  Laterality: N/A;  . Irrigation and debridement abscess N/A 08/16/2013    Procedure: DRESSING CHANGE AND DEBRIDEMENT OF PERINEUM WOUND;  Surgeon: Earnstine Regal, MD;  [facility];  Service: General;  Laterality: N/A;  . Pilonidal cyst drainage N/A 08/19/2013    Procedure: Dressing change perineum;  Surgeon: Anne Hahn  Rosendo Gros, MD;  Location[facility];  Service: General;  Laterality: N/A;  . Minor application of wound vac N/A 08/20/2013    Procedure: MINOR APPLICATION OF WOUND VAC;  Surgeon: Ralene Ok, MD;  [facility];  Service: General;  Laterality: N/A;  . Incision and drainage abscess N/A 08/20/2013    Procedure: INCISION AND DRAINAGE ABSCESS;  Surgeon: Ralene Ok, MD;  [facility];  Service: General;  Laterality: N/A;    Family History  Problem Relation Age of Onset  . Breast cancer Sister     x 2  . Diabetes Sister   . Prostate cancer Brother   . Colon cancer Neg Hx   . Stroke Mother   . Kidney failure Sister   . Heart disease Paternal Uncle     multiple  . Heart disease Maternal Uncle     multiple    Social History   Social History  . Marital Status: Married    Spouse Name: N/A  . Number of Children: N/A  .  Years of Education: N/A   Occupational History  . hairdresser    Social History Main Topics  . Smoking status: Never Smoker   . Smokeless tobacco: Never Used  . Alcohol Use: No  . Drug Use: No  . Sexual Activity: No   Other Topics Concern  . Not on file   Social History Narrative   Lives at home with her husband.  Uses a walker for ambulation     Current outpatient prescriptions:  .  allopurinol (ZYLOPRIM) 100 MG tablet, TAKE TWO TABLETS BY MOUTH EVERY OTHER DAY, Disp: 135 tablet, Rfl: 0 .  allopurinol (ZYLOPRIM) 100 MG tablet, Take 1-2 tablets (100-200 mg total) by mouth every other day. Take 100 mg and then 200 mg on alternating days., Disp: 135 tablet, Rfl: 1 .  anastrozole (ARIMIDEX) 1 MG tablet, TAKE 1 TABLET BY MOUTH ONCE DAILY, Disp: 90 tablet, Rfl: 2 .  aspirin 325 MG EC tablet, Take 1 tablet (325 mg total) by mouth every other day., Disp: , Rfl:  .  cephALEXin (KEFLEX) 500 MG capsule, Take 1 capsule (500 mg total) by mouth 4 (four) times daily., Disp: 40 capsule, Rfl: 0 .  cholecalciferol (VITAMIN D) 1000 UNITS tablet, Take 1,000 Units by mouth every morning. , Disp: , Rfl:  .  furosemide (LASIX) 40 MG tablet, TAKE ONE TABLET BY MOUTH IN THE MORNING, Disp: 30 tablet, Rfl: 5 .  lisinopril (PRINIVIL,ZESTRIL) 20 MG tablet, TAKE TWO TABLETS BY MOUTH ONCE DAILY, Disp: 180 tablet, Rfl: 0 .  pantoprazole (PROTONIX) 40 MG tablet, Take 1 tablet (40 mg total) by mouth daily., Disp: 30 tablet, Rfl: 0 .  traMADol-acetaminophen (ULTRACET) 37.5-325 MG per tablet, TAKE ONE TABLET BY MOUTH EVERY 6 HOURS AS NEEDED, Disp: 120 tablet, Rfl: 1 .  anastrozole (ARIMIDEX) 1 MG tablet, Take 1 mg by mouth daily. Reported on 07/04/2015, Disp: , Rfl:  .  azithromycin (ZITHROMAX Z-PAK) 250 MG tablet, Use as directed (Patient not taking: Reported on 11/25/2014), Disp: 6 tablet, Rfl: 1 .  benzonatate (TESSALON PERLES) 100 MG capsule, Take 1 capsule (100 mg total) by mouth 3 (three) times daily as needed.,  Disp: 20 capsule, Rfl: 0 .  lisinopril (PRINIVIL,ZESTRIL) 20 MG tablet, Take 40 mg by mouth daily. Reported on 07/04/2015, Disp: , Rfl:   EXAM:  Filed Vitals:   07/04/15 0949  BP: 140/70  Pulse: 88  Temp: 98.1 F (36.7 C)  Resp: 18    Body  mass index is 39.23 kg/(m^2).  GENERAL: vitals reviewed and listed above, alert, oriented, appears well hydrated and in no acute distress  HEENT: atraumatic, conjunttiva clear, no obvious abnormalities on inspection of external nose and ears, normal appearance of ear canals and TMs, clear nasal congestion, mild post oropharyngeal erythema with PND, no tonsillar edema or exudate, no sinus TTP  NECK: no obvious masses on inspection  LUNGS: clear to auscultation bilaterally, no wheezes, rales or rhonchi, good air movement  CV: HRRR, no peripheral edema  MS: moves all extremities without noticeable abnormality  PSYCH: pleasant and cooperative, no obvious depression or anxiety  ASSESSMENT AND PLAN:  Discussed the following assessment and plan:  Viral upper respiratory infection  -given HPI and exam findings today, a serious infection or illness is unlikely. We discussed potential etiologies, with VURI being most likely, and advised supportive care and monitoring. We discussed treatment side effects, likely course, antibiotic misuse, transmission, and signs of developing a serious illness. -of course, we advised to return or notify a doctor immediately if symptoms worsen or persist or new concerns arise.    Patient Instructions  INSTRUCTIONS FOR UPPER RESPIRATORY INFECTION:  -plenty of rest and fluids  -nasal saline wash 2-3 times daily (use prepackaged nasal saline or bottled/distilled water if making your own)   -can use AFRIN nasal spray for drainage and nasal congestion - but do NOT use longer then 3-4 days  -in the winter time, using a humidifier at night is helpful (please follow cleaning instructions)  -if you are taking a cough  medication - use only as directed, may also try a teaspoon of honey to coat the throat and throat lozenges. If given a cough medication with codeine or hydrocodone or other narcotic please be advised that this contains a strong and  potentially addicting medication. Please follow instructions carefully, take as little as possible and only use AS NEEDED for severe cough. Discuss potential side effects with your pharmacy. Please do not drive or operate machinery while taking these types of medications. Please do not take other sedating medications, drugs or alcohol while taking this medication without discussing with your doctor.  -for sore throat, salt water gargles can help  -follow up if you have fevers, facial pain, tooth pain, difficulty breathing or are worsening or symptoms persist longer then expected  Upper Respiratory Infection, Adult An upper respiratory infection (URI) is also known as the common cold. It is often caused by a type of germ (virus). Colds are easily spread (contagious). You can pass it to others by kissing, coughing, sneezing, or drinking out of the same glass. Usually, you get better in 1 to 3  weeks.  However, the cough can last for even longer. HOME CARE   Only take medicine as told by your doctor. Follow instructions provided above.  Drink enough water and fluids to keep your pee (urine) clear or pale yellow.  Get plenty of rest.  Return to work when your temperature is < 100 for 24 hours or as told by your doctor. You may use a face mask and wash your hands to stop your cold from spreading. GET HELP RIGHT AWAY IF:   After the first few days, you feel you are getting worse.  You have questions about your medicine.  You have chills, shortness of breath, or red spit (mucus).  You have pain in the face for more then 1-2 days, especially when you bend forward.  You have a fever, puffy (swollen) neck,  pain when you swallow, or white spots in the back of your  throat.  You have a bad headache, ear pain, sinus pain, or chest pain.  You have a high-pitched whistling sound when you breathe in and out (wheezing).  You cough up blood.  You have sore muscles or a stiff neck. MAKE SURE YOU:   Understand these instructions.  Will watch your condition.  Will get help right away if you are not doing well or get worse. Document Released: 11/16/2007 Document Revised: 08/22/2011 Document Reviewed: 09/04/2013 [facility] Patient Information 2015 [place], [place]. This information is not intended to replace advice given to you by your health care provider. Make sure you discuss any questions you have with your health care provider.      Marie Benton R.

## 2015-08-26 MED FILL — ANASTROZOLE 1 MG TABLET: 1 | 90 days supply | Qty: 90 | Fill #1

## 2015-09-17 DIAGNOSIS — Z853 Personal history of malignant neoplasm of breast: Secondary | ICD-10-CM | POA: Diagnosis not present

## 2015-09-28 ENCOUNTER — Other Ambulatory Visit: Payer: Self-pay | Admitting: Internal Medicine

## 2015-11-10 ENCOUNTER — Other Ambulatory Visit: Payer: Self-pay

## 2015-11-10 ENCOUNTER — Other Ambulatory Visit: Payer: Self-pay | Admitting: Internal Medicine

## 2015-11-10 MED ORDER — FUROSEMIDE 40 MG PO TABS: 40.0000 mg | ORAL_TABLET | Freq: Every morning | ORAL | Status: DC

## 2015-11-24 ENCOUNTER — Other Ambulatory Visit: Payer: Self-pay | Admitting: Internal Medicine

## 2015-11-24 NOTE — Telephone Encounter (Signed)
Done hardcopy to Corinne  

## 2015-11-24 NOTE — Telephone Encounter (Signed)
Please advise 

## 2015-11-25 NOTE — Telephone Encounter (Signed)
Medication faxed to pharmacy 

## 2015-12-01 MED FILL — ANASTROZOLE 1 MG TABLET: 1 | 90 days supply | Qty: 90 | Fill #2

## 2015-12-26 ENCOUNTER — Other Ambulatory Visit: Payer: Self-pay | Admitting: Internal Medicine

## 2016-02-02 ENCOUNTER — Other Ambulatory Visit: Payer: Self-pay | Admitting: Internal Medicine

## 2016-02-04 ENCOUNTER — Telehealth: Payer: Self-pay | Admitting: Emergency Medicine

## 2016-02-04 MED ORDER — ALLOPURINOL 100 MG PO TABS: ORAL_TABLET | ORAL | 0 refills | Status: DC

## 2016-02-04 NOTE — Telephone Encounter (Signed)
Pt called and needs a prescription refill on lisinopril (PRINIVIL,ZESTRIL) 20 MG tablet and allopurinol (ZYLOPRIM) 100 MG tablet. [facility] is [facility]. Please advise thanks.

## 2016-02-04 NOTE — Telephone Encounter (Signed)
Refills sent to pharmacy. 

## 2016-02-05 ENCOUNTER — Other Ambulatory Visit: Payer: Self-pay | Admitting: Internal Medicine

## 2016-02-26 DIAGNOSIS — Z853 Personal history of malignant neoplasm of breast: Secondary | ICD-10-CM | POA: Diagnosis not present

## 2016-03-01 ENCOUNTER — Telehealth: Payer: Self-pay | Admitting: *Deleted

## 2016-03-01 NOTE — Telephone Encounter (Signed)
"  Calling from Dr. Earlie Server office to schedule F/U for this patient.  We saw her Friday, she's on Arimidex and doesn't recall seeing anyone since Dr. Truddie Coco."    Last F/U at [facility] was 02-18-2014 by Gentry Fitz NP.  Arimidex started November 2013 for a 5 year planned with annual F/U until she reaches the [age] year mark.  Will notify Dr. Jana Hakim.

## 2016-03-04 ENCOUNTER — Other Ambulatory Visit: Payer: Self-pay

## 2016-03-04 DIAGNOSIS — C50411 Malignant neoplasm of upper-outer quadrant of right female breast: Secondary | ICD-10-CM

## 2016-03-04 MED ORDER — ANASTROZOLE 1 MG PO TABS: 1.0000 mg | ORAL_TABLET | Freq: Every day | ORAL | 0 refills | Status: DC

## 2016-03-10 ENCOUNTER — Other Ambulatory Visit: Payer: Self-pay | Admitting: *Deleted

## 2016-03-10 ENCOUNTER — Telehealth: Payer: Self-pay | Admitting: Oncology

## 2016-03-10 DIAGNOSIS — C50411 Malignant neoplasm of upper-outer quadrant of right female breast: Secondary | ICD-10-CM

## 2016-03-10 MED ORDER — ANASTROZOLE 1 MG PO TABS: 1.0000 mg | ORAL_TABLET | Freq: Every day | ORAL | 1 refills | Status: DC

## 2016-03-10 NOTE — Telephone Encounter (Signed)
10/12 Appointment rescheduled to 10/30 per patient request. Patient has prior plans that day will not be available.

## 2016-03-24 ENCOUNTER — Ambulatory Visit: Payer: Self-pay | Admitting: Oncology

## 2016-03-24 ENCOUNTER — Other Ambulatory Visit: Payer: Medicare Other

## 2016-04-08 ENCOUNTER — Other Ambulatory Visit: Payer: Self-pay

## 2016-04-08 DIAGNOSIS — C50911 Malignant neoplasm of unspecified site of right female breast: Secondary | ICD-10-CM

## 2016-04-11 ENCOUNTER — Other Ambulatory Visit (HOSPITAL_BASED_OUTPATIENT_CLINIC_OR_DEPARTMENT_OTHER): Payer: Medicare Other

## 2016-04-11 ENCOUNTER — Ambulatory Visit (HOSPITAL_BASED_OUTPATIENT_CLINIC_OR_DEPARTMENT_OTHER): Payer: Medicare Other | Admitting: Oncology

## 2016-04-11 DIAGNOSIS — C50911 Malignant neoplasm of unspecified site of right female breast: Secondary | ICD-10-CM

## 2016-04-11 DIAGNOSIS — Z79811 Long term (current) use of aromatase inhibitors: Secondary | ICD-10-CM

## 2016-04-11 DIAGNOSIS — Z170 Estrogen receptor positive status [ER+]: Secondary | ICD-10-CM

## 2016-04-11 DIAGNOSIS — C50411 Malignant neoplasm of upper-outer quadrant of right female breast: Secondary | ICD-10-CM

## 2016-04-11 LAB — CBC WITH DIFFERENTIAL/PLATELET
BASO%: 0.5 % (ref 0.0–2.0)
Basophils Absolute: 0 10*3/uL (ref 0.0–0.1)
EOS%: 2.9 % (ref 0.0–7.0)
Eosinophils Absolute: 0.2 10*3/uL (ref 0.0–0.5)
HCT: 42.9 % (ref 34.8–46.6)
HGB: 13.6 g/dL (ref 11.6–15.9)
LYMPH%: 22.5 % (ref 14.0–49.7)
MCH: 29.2 pg (ref 25.1–34.0)
MCHC: 31.8 g/dL (ref 31.5–36.0)
MCV: 91.9 fL (ref 79.5–101.0)
MONO#: 0.8 10*3/uL (ref 0.1–0.9)
MONO%: 10.5 % (ref 0.0–14.0)
NEUT#: 5.1 10*3/uL (ref 1.5–6.5)
NEUT%: 63.6 % (ref 38.4–76.8)
Platelets: 180 10*3/uL (ref 145–400)
RBC: 4.67 10*6/uL (ref 3.70–5.45)
RDW: 15.9 % — ABNORMAL HIGH (ref 11.2–14.5)
WBC: 8 10*3/uL (ref 3.9–10.3)
lymph#: 1.8 10*3/uL (ref 0.9–3.3)

## 2016-04-11 LAB — COMPREHENSIVE METABOLIC PANEL
ALT: 23 U/L (ref 0–55)
AST: 30 U/L (ref 5–34)
Albumin: 3.2 g/dL — ABNORMAL LOW (ref 3.5–5.0)
Alkaline Phosphatase: 122 U/L (ref 40–150)
Anion Gap: 9 mEq/L (ref 3–11)
BUN: 42.7 mg/dL — ABNORMAL HIGH (ref 7.0–26.0)
CO2: 30 mEq/L — ABNORMAL HIGH (ref 22–29)
Calcium: 10.4 mg/dL (ref 8.4–10.4)
Chloride: 105 mEq/L (ref 98–109)
Creatinine: 1.3 mg/dL — ABNORMAL HIGH (ref 0.6–1.1)
EGFR: 39 mL/min/{1.73_m2} — ABNORMAL LOW (ref >90)
Glucose: 159 mg/dl — ABNORMAL HIGH (ref 70–140)
Potassium: 4.9 mEq/L (ref 3.5–5.1)
Sodium: 144 mEq/L (ref 136–145)
Total Bilirubin: 0.59 mg/dL (ref 0.20–1.20)
Total Protein: 7.4 g/dL (ref 6.4–8.3)

## 2016-04-11 MED ORDER — ANASTROZOLE 1 MG PO TABS: 1.0000 mg | ORAL_TABLET | Freq: Every day | ORAL | 4 refills | Status: DC

## 2016-04-11 MED FILL — ANASTROZOLE 1 MG TABLET: 1 | 90 days supply | Qty: 90 | Fill #0

## 2016-04-11 NOTE — Progress Notes (Signed)
ID: [name]   DOB: 12/22/1929  MR#: [id number]  VFI#:[id number]  PCP: [name], MD GYN:  None  SU: Dr. Rockne Coons   CHIEF COMPLAINT: right breast cancer  CURRENT TREATMENT: anastrozole 4m daily  BREAST CANCER HISTORY: From the original intake note:   [name] a 82year old [place] who underwent screening mammography in December of 2012 and a follow up right mammogram was recommended.  The mammogram and ultrasound demonstrated a area of abnormality 7 cm from the nipple measuring 1.5 x 1.3 x 1.1 cm with ultrasound of the axilla negative.  A biopsy of the mass performed on 06/09/2011 showed invasive ductal carcinoma, grade I-II,  ER + 100%, PR +100%, Ki-67 9%, HER-2 negative with ratio of 1.20.  MRI scans of the breasts on 06/17/2011 showed a mass measuring 2.8 x 1.7 x 1.3 cm with no other abnormalities seen. The patient underwent a right lumpectomy with sentinel lymph node evaluation on 06/29/2011 by Dr. MHassell Donewith final pathology showing a 2.8 cm, grade 1 invasive ductal carcinoma with no evidence of angiolymphatic invasion with closest surgical margins of 0.2cm. One sentinel lymph node identified was negative for malignancy.  The patient's postoperative course was complicated by a hematoma and she completed a course of antibiotics.  She completed radiation under the care of Dr. MValere Drossfrom 02/15/12 to 04/02/12.  She was started on Arimidex 1 mg daily in 04/2012.  INTERVAL HISTORY:   Ms. CQuesenberryreturns today for follow up of her estrogen receptor positive breast cancer. She continues on anastrozole, with good tolerance. Hot flashes and vaginal dryness are not a major issue. She never developed the arthralgias or myalgias that many patients can experience on this medication. She obtains it at a good price.  REVIEW OF SYSTEMS: Ms. CParkeyis very limited because of her significant osteoarthritis and prior surgeries, and she "doesn't everything sitting down". She manages quite well  in her house however. A detailed review of systems was stable.  PAST MEDICAL HISTORY: Past Medical History:  Diagnosis Date  . Anemia   . Anxiety   . Atrial flutter    "sometimes"  . Blood transfusion   . Breast cancer    right breast  . Bronchitis   . Chronic kidney disease    occ uti's  . Colon polyps    hyperplastic  . CVA (cerebral infarction)   . Diverticulosis   . DVT of leg (deep venous thrombosis)    left; S/P OR  . Dysrhythmia    dr bensimhon   . Esophageal stricture   . GERD (gastroesophageal reflux disease)   . Gout   . Hiatal hernia    4 cm  . History of radiation therapy 02/15/12-04/02/12   right breast 4680 cGy/26 sessions, right boost=1400cGy /7 sessions  . Hyperlipidemia   . Hypertension   . IBS (irritable bowel syndrome)   . Kidney stones 1990's  . Morbid obesity   . Osteoarthritis   . Pneumonia    "walking"  . PONV (postoperative nausea and vomiting)   . Shingles 02/21/2011  . Shortness of breath on exertion   . Skin cancer   . Stroke 2002   residual:  "little tingling in palm of left hand"  . UTI (lower urinary tract infection)    "I've had a few"    PAST SURGICAL HISTORY: Past Surgical History:  Procedure Laterality Date  . ABDOMINAL HYSTERECTOMY    . BILE DUCT STENT PLACEMENT  ~ 01/2011  .  BREAST LUMPECTOMY  06/29/11   right  . BREAST LUMPECTOMY  06/29/2011   Procedure: BREAST LUMPECTOMY WITH EXCISION OF SENTINEL NODE;  Surgeon: Pedro Earls, MD;  Location: [facility];  Service: General;  Laterality: Right;  right sentinel node mapping,right sentinel node biopsy, needle localization right breast lumpectomy  . CATARACT EXTRACTION W/ INTRAOCULAR LENS  IMPLANT, BILATERAL  1990's  . CHOLECYSTECTOMY  03/23/11   lap chole   . COLON SURGERY     hole in intestine ,tumors?  . colonic perforation repair  ~ 2000  . [place]  2012  . INCISION AND DRAINAGE ABSCESS N/A 08/12/2013   Procedure: INCISION AND DRAINAGE ABSCESS;  Surgeon: Earnstine Regal, MD;  [facility];  Service: General;  Laterality: N/A;  . INCISION AND DRAINAGE ABSCESS N/A 08/20/2013   Procedure: INCISION AND DRAINAGE ABSCESS;  Surgeon: Ralene Ok, MD;  [facility];  Service: General;  Laterality: N/A;  . INCISION AND DRAINAGE BREAST ABSCESS     right breast seroma  . IRRIGATION AND DEBRIDEMENT ABSCESS N/A 08/14/2013   Procedure: Debridment of perineal tissue and debridement of perineal wound;  Surgeon: Earnstine Regal, MD;  [facility];  Service: General;  Laterality: N/A;  . IRRIGATION AND DEBRIDEMENT ABSCESS N/A 08/16/2013   Procedure: DRESSING CHANGE AND DEBRIDEMENT OF PERINEUM WOUND;  Surgeon: Earnstine Regal, MD;  [facility];  Service: General;  Laterality: N/A;  . JOINT REPLACEMENT    . MINOR APPLICATION OF WOUND VAC N/A 08/20/2013   Procedure: MINOR APPLICATION OF WOUND VAC;  Surgeon: Ralene Ok, MD;  [facility];  Service: General;  Laterality: N/A;  . pelvic bone tumor removal     twice in 2000's  . PILONIDAL CYST DRAINAGE N/A 08/19/2013   Procedure: Dressing change perineum;  Surgeon: Ralene Ok, MD;  [facility];  Service: General;  Laterality: N/A;  . REPLACEMENT TOTAL KNEE BILATERAL  ~ 2008; ~ 2010   right; left  . TONSILLECTOMY  ~ 1946  . TOTAL HIP ARTHROPLASTY  1980's   right    FAMILY HISTORY Family History  Problem Relation Age of Onset  . Breast cancer Sister     x 2  . Diabetes Sister   . Prostate cancer Brother   . Colon cancer Neg Hx   . Stroke Mother   . Kidney failure Sister   . Heart disease Paternal Uncle     multiple  . Heart disease Maternal Uncle     multiple    GYNECOLOGIC HISTORY:  G 5 P 2, menarche-15, menopause at time of hysterectomy, no long term use of HRT  SOCIAL HISTORY:  She lives at home with her husband and has been married for over 22 years.  She has two sons: one who lives in [place] and one who lives 2 miles away from her.  She continues to cut hair, with a maximum of 5  people a day.  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    ADVANCED DIRECTIVES:  Living will.  HEALTH MAINTENANCE: Social History  Substance Use Topics  . Smoking status: Never Smoker  . Smokeless tobacco: Never Used  . Alcohol use No     Colonoscopy:  Last year due to rectal bleeding, which was related to hemorrhoids  PAP:  Has had hysterectomy  Bone density:  08/2011 resulting normal  Lipid panel:  04/2012 with total cholesterol of 219, trig 276, HDL 31.30, LDL 55.2  Allergies  Allergen Reactions  .  Ace Inhibitors Other (See Comments)    Tolerates lisinopril at home. Pt does not recall allergy.  . Atorvastatin     REACTION: myalgias  . Oxycodone-Acetaminophen     REACTION: confusion, fatigue  . Rosuvastatin     REACTION: leg weakness  . Simvastatin     REACTION: leg weakness  . Codeine Nausea And Vomiting and Rash  . Sulfonamide Derivatives Hives and Rash    Current Outpatient Prescriptions  Medication Sig Dispense Refill  . allopurinol (ZYLOPRIM) 100 MG tablet TAKE TWO TABLETS BY MOUTH EVERY OTHER DAY AND  1  TABLET  EVERY  OTHER  DAY 135 tablet 0  . anastrozole (ARIMIDEX) 1 MG tablet Take 1 tablet (1 mg total) by mouth daily. 90 tablet 4  . aspirin 325 MG EC tablet Take 1 tablet (325 mg total) by mouth every other day.    . cholecalciferol (VITAMIN D) 1000 UNITS tablet Take 1,000 Units by mouth every morning.     . furosemide (LASIX) 40 MG tablet TAKE ONE TABLET BY MOUTH IN THE MORNING 90 tablet 0  . lisinopril (PRINIVIL,ZESTRIL) 20 MG tablet Take 40 mg by mouth daily. Reported on 07/04/2015    . lisinopril (PRINIVIL,ZESTRIL) 20 MG tablet TAKE TWO TABLETS BY MOUTH ONCE DAILY 180 tablet 0  . pantoprazole (PROTONIX) 40 MG tablet Take 1 tablet (40 mg total) by mouth daily. 30 tablet 0  . traMADol-acetaminophen (ULTRACET) 37.5-325 MG tablet TAKE ONE TABLET BY MOUTH EVERY 6 HOURS AS NEEDED 120 tablet 2   No current facility-administered medications for this visit.      OBJECTIVE: Elderly white woman Using a walker Vitals:   04/11/16 1007  BP: (!) 172/65  Pulse: 92  Resp: 18  Temp: 97.7 F (36.5 C)     Body mass index is 38.77 kg/m.    ECOG FS: 1   Sclerae unicteric, EOMs intact Oropharynx clear and moist No cervical or supraclavicular adenopathy Lungs no rales or rhonchi Heart regular rate and rhythm Abd soft, nontender, positive bowel sounds MSK kyphosis but no focal spinal tenderness Neuro: nonfocal, well oriented, appropriate affect Breasts: Deferred  LAB RESULTS: Lab Results  Component Value Date   WBC 8.0 04/11/2016   NEUTROABS 5.1 04/11/2016   HGB 13.6 04/11/2016   HCT 42.9 04/11/2016   MCV 91.9 04/11/2016   PLT 180 04/11/2016      Chemistry      Component Value Date/Time   NA 144 04/11/2016 0948   K 4.9 04/11/2016 0948   CL 110 06/21/2014 0622   CO2 30 (H) 04/11/2016 0948   BUN 42.7 (H) 04/11/2016 0948   CREATININE 1.3 (H) 04/11/2016 0948      Component Value Date/Time   CALCIUM 10.4 04/11/2016 0948   ALKPHOS 122 04/11/2016 0948   AST 30 04/11/2016 0948   ALT 23 04/11/2016 0948   BILITOT 0.59 04/11/2016 0948       Lab Results  Component Value Date   LABCA2 20 08/01/2011    No components found for: [id number]  No results for input(s): INR in the last 168 hours.  Urinalysis    Component Value Date/Time   COLORURINE YELLOW 06/18/2014 0334   APPEARANCEUR CLOUDY (A) 06/18/2014 0334   LABSPEC 1.018 06/18/2014 0334   PHURINE 5.5 06/18/2014 [place] 06/18/2014 0334   GLUCOSEU NEGATIVE 08/17/2010 1434   HGBUR NEGATIVE 06/18/2014 0334   BILIRUBINUR neg 12/09/2014 1506   KETONESUR NEGATIVE 06/18/2014 0334   PROTEINUR neg 12/09/2014 1506  PROTEINUR NEGATIVE 06/18/2014 0334   UROBILINOGEN 0.2 12/09/2014 1506   UROBILINOGEN 0.2 06/18/2014 0334   NITRITE neg 12/09/2014 1506   NITRITE POSITIVE (A) 06/18/2014 0334   LEUKOCYTESUR large (3+) (A) 12/09/2014 1506    STUDIES: CLINICAL DATA:   82 year old female presenting for routine postlumpectomy follow-up. Patient has history of right breast lumpectomy in January of 2013.  EXAM: DIGITAL DIAGNOSTIC BILATERAL MAMMOGRAM WITH 3D TOMOSYNTHESIS AND CAD  COMPARISON:  Previous exam(s).  ACR Breast Density Category b: There are scattered areas of fibroglandular density.  FINDINGS: The right breast lumpectomy site is stable. No suspicious calcifications, masses or areas of distortion are seen in the bilateral breasts.  Mammographic images were processed with CAD.  IMPRESSION: Stable right breast lumpectomy site. No mammographic evidence of malignancy in the bilateral breasts.  RECOMMENDATION: Diagnostic mammogram is suggested in 1 year. (Code:[id number]-B-01Y)  I have discussed the findings and recommendations with the patient. Results were also provided in writing at the conclusion of the visit. If applicable, a reminder letter will be sent to the patient regarding the next appointment.  BI-RADS CATEGORY  2: Benign.   Electronically Signed   By: Ammie Ferrier M.D.   On: 06/16/2015 13:52   ASSESSMENT: 82 y.o. [place] woman:  #1 S/p right lumpectomy and sentinel lymph node biopsy on 06/29/11 for a pT2 pN0, stage IIA IDC Grade I, ER+, PR+, Ki67 9%, HER2 -.  #2 She completed radiation therapy from 02/25/12 to 04/02/12.  #3 She has been on Arimidex 1 mg daily since 04/2012. She had a normal bone density March of 2013  PLAN:   Ms. Mcmartin is now nearly 5 years out from definitive surgery for her breast cancer with no evidence of disease recurrence. This is very favorable.  She is tolerating the anastrozole well. The plan will be to continue that through October 2018, at which time she will be ready to "graduate" from follow-up here.  She knows to call for any problems that may develop before her next visit.  Khalif Stender C    04/11/2016

## 2016-04-20 ENCOUNTER — Other Ambulatory Visit: Payer: Self-pay | Admitting: Internal Medicine

## 2016-04-20 ENCOUNTER — Encounter: Payer: Self-pay | Admitting: Internal Medicine

## 2016-04-20 ENCOUNTER — Ambulatory Visit (INDEPENDENT_AMBULATORY_CARE_PROVIDER_SITE_OTHER): Payer: Medicare Other | Admitting: Internal Medicine

## 2016-04-20 ENCOUNTER — Other Ambulatory Visit (INDEPENDENT_AMBULATORY_CARE_PROVIDER_SITE_OTHER): Payer: Medicare Other

## 2016-04-20 VITALS — BP 142/80 | HR 100 | Temp 97.8°F | Resp 20 | Wt 232.2 lb

## 2016-04-20 DIAGNOSIS — E119 Type 2 diabetes mellitus without complications: Secondary | ICD-10-CM

## 2016-04-20 DIAGNOSIS — Z0001 Encounter for general adult medical examination with abnormal findings: Secondary | ICD-10-CM | POA: Diagnosis not present

## 2016-04-20 DIAGNOSIS — Z0000 Encounter for general adult medical examination without abnormal findings: Secondary | ICD-10-CM | POA: Diagnosis not present

## 2016-04-20 DIAGNOSIS — R7989 Other specified abnormal findings of blood chemistry: Secondary | ICD-10-CM | POA: Diagnosis not present

## 2016-04-20 DIAGNOSIS — N183 Chronic kidney disease, stage 3 unspecified: Secondary | ICD-10-CM

## 2016-04-20 DIAGNOSIS — Z23 Encounter for immunization: Secondary | ICD-10-CM

## 2016-04-20 DIAGNOSIS — J309 Allergic rhinitis, unspecified: Secondary | ICD-10-CM | POA: Insufficient documentation

## 2016-04-20 DIAGNOSIS — I10 Essential (primary) hypertension: Secondary | ICD-10-CM | POA: Diagnosis not present

## 2016-04-20 DIAGNOSIS — E039 Hypothyroidism, unspecified: Secondary | ICD-10-CM

## 2016-04-20 LAB — CBC WITH DIFFERENTIAL/PLATELET
Basophils Absolute: 0 10*3/uL (ref 0.0–0.1)
Basophils Relative: 0.2 % (ref 0.0–3.0)
Eosinophils Absolute: 0.3 10*3/uL (ref 0.0–0.7)
Eosinophils Relative: 2.6 % (ref 0.0–5.0)
HCT: 43 % (ref 36.0–46.0)
Hemoglobin: 14.1 g/dL (ref 12.0–15.0)
Lymphocytes Relative: 24.6 % (ref 12.0–46.0)
Lymphs Abs: 2.5 10*3/uL (ref 0.7–4.0)
MCHC: 32.7 g/dL (ref 30.0–36.0)
MCV: 90.2 fl (ref 78.0–100.0)
Monocytes Absolute: 0.9 10*3/uL (ref 0.1–1.0)
Monocytes Relative: 9.2 % (ref 3.0–12.0)
Neutro Abs: 6.4 10*3/uL (ref 1.4–7.7)
Neutrophils Relative %: 63.4 % (ref 43.0–77.0)
Platelets: 203 10*3/uL (ref 150.0–400.0)
RBC: 4.77 Mil/uL (ref 3.87–5.11)
RDW: 15.9 % — ABNORMAL HIGH (ref 11.5–15.5)
WBC: 10.1 10*3/uL (ref 4.0–10.5)

## 2016-04-20 LAB — URINALYSIS, ROUTINE W REFLEX MICROSCOPIC
Bilirubin Urine: NEGATIVE
Ketones, ur: NEGATIVE
Nitrite: POSITIVE — AB
Specific Gravity, Urine: 1.01 (ref 1.000–1.030)
Total Protein, Urine: NEGATIVE
Urine Glucose: NEGATIVE
Urobilinogen, UA: 0.2 (ref 0.0–1.0)
pH: 6 (ref 5.0–8.0)

## 2016-04-20 LAB — MICROALBUMIN / CREATININE URINE RATIO
Creatinine,U: 84.7 mg/dL
Microalb Creat Ratio: 4.8 mg/g (ref 0.0–30.0)
Microalb, Ur: 4.1 mg/dL — ABNORMAL HIGH (ref 0.0–1.9)

## 2016-04-20 LAB — BASIC METABOLIC PANEL
BUN: 35 mg/dL — ABNORMAL HIGH (ref 6–23)
CO2: 34 mEq/L — ABNORMAL HIGH (ref 19–32)
Calcium: 10.5 mg/dL (ref 8.4–10.5)
Chloride: 102 mEq/L (ref 96–112)
Creatinine, Ser: 1.23 mg/dL — ABNORMAL HIGH (ref 0.40–1.20)
GFR: 44.19 mL/min — ABNORMAL LOW (ref >60.00)
Glucose, Bld: 141 mg/dL — ABNORMAL HIGH (ref 70–99)
Potassium: 4.5 mEq/L (ref 3.5–5.1)
Sodium: 144 mEq/L (ref 135–145)

## 2016-04-20 LAB — LDL CHOLESTEROL, DIRECT: Direct LDL: 126 mg/dL

## 2016-04-20 LAB — HEPATIC FUNCTION PANEL
ALT: 19 U/L (ref 0–35)
AST: 27 U/L (ref 0–37)
Albumin: 3.9 g/dL (ref 3.5–5.2)
Alkaline Phosphatase: 95 U/L (ref 39–117)
Bilirubin, Direct: 0.1 mg/dL (ref 0.0–0.3)
Total Bilirubin: 0.8 mg/dL (ref 0.2–1.2)
Total Protein: 7.6 g/dL (ref 6.0–8.3)

## 2016-04-20 LAB — LIPID PANEL
Cholesterol: 182 mg/dL (ref 0–200)
HDL: 38.3 mg/dL — ABNORMAL LOW (ref >39.00)
NonHDL: 144.09
Total CHOL/HDL Ratio: 5
Triglycerides: 209 mg/dL — ABNORMAL HIGH (ref 0.0–149.0)
VLDL: 41.8 mg/dL — ABNORMAL HIGH (ref 0.0–40.0)

## 2016-04-20 LAB — HEMOGLOBIN A1C: Hgb A1c MFr Bld: 7.8 % — ABNORMAL HIGH (ref 4.6–6.5)

## 2016-04-20 LAB — TSH: TSH: 0.15 u[IU]/mL — ABNORMAL LOW (ref 0.35–4.50)

## 2016-04-20 MED ORDER — CEPHALEXIN 500 MG PO CAPS: 500.0000 mg | ORAL_CAPSULE | Freq: Four times a day (QID) | ORAL | 0 refills | Status: DC

## 2016-04-20 MED ORDER — METFORMIN HCL ER 500 MG PO TB24: 500.0000 mg | ORAL_TABLET | Freq: Every day | ORAL | 3 refills | Status: DC

## 2016-04-20 MED ORDER — EZETIMIBE 10 MG PO TABS: 10.0000 mg | ORAL_TABLET | Freq: Every day | ORAL | 3 refills | Status: DC

## 2016-04-20 NOTE — Patient Instructions (Addendum)
You had the flu shot today  OK to use the OTC Claritin for allergies daily as needed  Please continue all other medications as before, and refills have been done if requested.  Please have the pharmacy call with any other refills you may need.  Please continue your efforts at being more active, low cholesterol diet, and weight control.  You are otherwise up to date with prevention measures today.  Please keep your appointments with your specialists as you may have planned  Please go to the LAB in the Basement (turn left off the elevator) for the tests to be done today  You will be contacted by phone if any changes need to be made immediately.  Otherwise, you will receive a letter about your results with an explanation, but please check with MyChart first.  Please remember to sign up for MyChart if you have not done so, as this will be important to you in the future with finding out test results, communicating by private email, and scheduling acute appointments online when needed.  Please return in 6 months, or sooner if needed, with Lab testing done 3-5 days before

## 2016-04-20 NOTE — Progress Notes (Signed)
Subjective:    Patient ID: [name], female    DOB: 1930/02/03, 82 y.o.   MRN: [id number]  HPI  Here for wellness and f/u;  Overall doing ok;  Pt denies Chest pain, worsening SOB, DOE, wheezing, orthopnea, PND, worsening LE edema, palpitations, dizziness or syncope.  Pt denies neurological change such as new headache, facial or extremity weakness.  Pt denies polydipsia, polyuria, or low sugar symptoms. Pt states overall good compliance with treatment and medications, good tolerability, and has been trying to follow appropriate diet.  Pt denies worsening depressive symptoms, suicidal ideation or panic. No fever, night sweats, wt loss, loss of appetite, or other constitutional symptoms.  Pt states good ability with ADL's, has low fall risk, home safety reviewed and adequate, no other significant changes in hearing or vision, and not active with exercise.  Declines flu shot.and DXA.    Does have several wks ongoing nasal allergy symptoms with clearish congestion, itch and sneezing, without fever, pain, ST, cough, swelling or wheezing. Past Medical History:  Diagnosis Date  . Anemia   . Anxiety   . Atrial flutter ([place])    "sometimes"  . Blood transfusion   . Breast cancer ([place])    right breast  . Bronchitis   . Chronic kidney disease    occ uti's  . CKD (chronic kidney disease) stage 3, GFR 30-59 ml/min   . Colon polyps    hyperplastic  . CVA (cerebral infarction)   . Diverticulosis   . DVT of leg (deep venous thrombosis) ([name])    left; S/P OR  . Dysrhythmia    dr bensimhon   . Esophageal stricture   . GERD (gastroesophageal reflux disease)   . Gout   . Hiatal hernia    4 cm  . History of radiation therapy 02/15/12-04/02/12   right breast 4680 cGy/26 sessions, right boost=1400cGy /7 sessions  . Hyperlipidemia   . Hypertension   . IBS (irritable bowel syndrome)   . Kidney stones 1990's  . Morbid obesity ([place])   . Osteoarthritis   . Pneumonia    "walking"  . PONV  (postoperative nausea and vomiting)   . Shingles 02/21/2011  . Shortness of breath on exertion   . Skin cancer   . Stroke [facility]) 2002   residual:  "little tingling in palm of left hand"  . UTI (lower urinary tract infection)    "I've had a few"   Past Surgical History:  Procedure Laterality Date  . ABDOMINAL HYSTERECTOMY    . BILE DUCT STENT PLACEMENT  ~ 01/2011  . BREAST LUMPECTOMY  06/29/11   right  . BREAST LUMPECTOMY  06/29/2011   Procedure: BREAST LUMPECTOMY WITH EXCISION OF SENTINEL NODE;  Surgeon: Pedro Earls, MD;  Location: [facility];  Service: General;  Laterality: Right;  right sentinel node mapping,right sentinel node biopsy, needle localization right breast lumpectomy  . CATARACT EXTRACTION W/ INTRAOCULAR LENS  IMPLANT, BILATERAL  1990's  . CHOLECYSTECTOMY  03/23/11   lap chole   . COLON SURGERY     hole in intestine ,tumors?  . colonic perforation repair  ~ 2000  . [place]  2012  . INCISION AND DRAINAGE ABSCESS N/A 08/12/2013   Procedure: INCISION AND DRAINAGE ABSCESS;  Surgeon: Earnstine Regal, MD;  [facility];  Service: General;  Laterality: N/A;  . INCISION AND DRAINAGE ABSCESS N/A 08/20/2013   Procedure: INCISION AND DRAINAGE ABSCESS;  Surgeon: Ralene Ok, MD;  [facility];  Service:  General;  Laterality: N/A;  . INCISION AND DRAINAGE BREAST ABSCESS     right breast seroma  . IRRIGATION AND DEBRIDEMENT ABSCESS N/A 08/14/2013   Procedure: Debridment of perineal tissue and debridement of perineal wound;  Surgeon: Earnstine Regal, MD;  [facility];  Service: General;  Laterality: N/A;  . IRRIGATION AND DEBRIDEMENT ABSCESS N/A 08/16/2013   Procedure: DRESSING CHANGE AND DEBRIDEMENT OF PERINEUM WOUND;  Surgeon: Earnstine Regal, MD;  [facility];  Service: General;  Laterality: N/A;  . JOINT REPLACEMENT    . MINOR APPLICATION OF WOUND VAC N/A 08/20/2013   Procedure: MINOR APPLICATION OF WOUND VAC;  Surgeon: Ralene Ok, MD;  [facility];   Service: General;  Laterality: N/A;  . pelvic bone tumor removal     twice in 2000's  . PILONIDAL CYST DRAINAGE N/A 08/19/2013   Procedure: Dressing change perineum;  Surgeon: Ralene Ok, MD;  [facility];  Service: General;  Laterality: N/A;  . REPLACEMENT TOTAL KNEE BILATERAL  ~ 2008; ~ 2010   right; left  . TONSILLECTOMY  ~ 1946  . TOTAL HIP ARTHROPLASTY  1980's   right    reports that she has never smoked. She has never used smokeless tobacco. She reports that she does not drink alcohol or use drugs. family history includes Breast cancer in her sister; Diabetes in her sister; Heart disease in her maternal uncle and paternal uncle; Kidney failure in her sister; Prostate cancer in her brother; Stroke in her mother. Allergies  Allergen Reactions  . Ace Inhibitors Other (See Comments)    Tolerates lisinopril at home. Pt does not recall allergy.  . Atorvastatin     REACTION: myalgias  . Oxycodone-Acetaminophen     REACTION: confusion, fatigue  . Rosuvastatin     REACTION: leg weakness  . Simvastatin     REACTION: leg weakness  . Codeine Nausea And Vomiting and Rash  . Sulfonamide Derivatives Hives and Rash   Current Outpatient Prescriptions on File Prior to Visit  Medication Sig Dispense Refill  . allopurinol (ZYLOPRIM) 100 MG tablet TAKE TWO TABLETS BY MOUTH EVERY OTHER DAY AND  1  TABLET  EVERY  OTHER  DAY 135 tablet 0  . anastrozole (ARIMIDEX) 1 MG tablet Take 1 tablet (1 mg total) by mouth daily. 90 tablet 4  . aspirin 325 MG EC tablet Take 1 tablet (325 mg total) by mouth every other day.    . cholecalciferol (VITAMIN D) 1000 UNITS tablet Take 1,000 Units by mouth every morning.     . furosemide (LASIX) 40 MG tablet TAKE ONE TABLET BY MOUTH IN THE MORNING 90 tablet 0  . lisinopril (PRINIVIL,ZESTRIL) 20 MG tablet Take 40 mg by mouth daily. Reported on 07/04/2015    . lisinopril (PRINIVIL,ZESTRIL) 20 MG tablet TAKE TWO TABLETS BY MOUTH ONCE DAILY 180 tablet 0  .  pantoprazole (PROTONIX) 40 MG tablet Take 1 tablet (40 mg total) by mouth daily. 30 tablet 0  . traMADol-acetaminophen (ULTRACET) 37.5-325 MG tablet TAKE ONE TABLET BY MOUTH EVERY 6 HOURS AS NEEDED 120 tablet 2   No current facility-administered medications on file prior to visit.     Review of Systems Constitutional: Negative for increased diaphoresis, or other activity, appetite or siginficant weight change other than noted HENT: Negative for worsening hearing loss, ear pain, facial swelling, mouth sores and neck stiffness.   Eyes: Negative for other worsening pain, redness or visual disturbance.  Respiratory: Negative for choking or stridor Cardiovascular:  Negative for other chest pain and palpitations.  Gastrointestinal: Negative for worsening diarrhea, blood in stool, or abdominal distention Genitourinary: Negative for hematuria, flank pain or change in urine volume.  Musculoskeletal: Negative for myalgias or other joint complaints.  Skin: Negative for other color change and wound or drainage.  Neurological: Negative for syncope and numbness. other than noted Hematological: Negative for adenopathy. or other swelling Psychiatric/Behavioral: Negative for hallucinations, SI, self-injury, decreased concentration or other worsening agitation.  All other system ne per pt    Objective:   Physical Exam BP (!) 142/80   Pulse 100   Temp 97.8 F (36.6 C) (Oral)   Resp 20   Wt 232 lb 4 oz (105.3 kg)   SpO2 92%   BMI 38.65 kg/m  VS noted, obese Constitutional: Pt is oriented to person, place, and time. Appears well-developed and well-nourished, in no significant distress Head: Normocephalic and atraumatic  Eyes: Conjunctivae and EOM are normal. Pupils are equal, round, and reactive to light Right Ear: External ear normal.  Left Ear: External ear normal Nose: Nose normal.  Mouth/Throat: Oropharynx is clear and moist  Bilat tm's with mild erythema.  Max sinus areas non tender.  Pharynx  with mild erythema, no exudate Neck: Normal range of motion. Neck supple. No JVD present. No tracheal deviation present or significant neck LA or mass Cardiovascular: Normal rate, regular rhythm, normal heart sounds and intact distal pulses.   Pulmonary/Chest: Effort normal and breath sounds without rales or wheezing  Abdominal: Soft. Bowel sounds are normal. NT. No HSM  Musculoskeletal: Normal range of motion. Exhibits no edema Lymphadenopathy: Has no cervical adenopathy.  Neurological: Pt is alert and oriented to person, place, and time. Pt has normal reflexes. No cranial nerve deficit. Motor grossly intact Skin: Skin is warm and dry. No rash noted or new ulcers Psychiatric:  Has normal mood and affect. Behavior is normal.  No other new exam findings    Assessment & Plan:

## 2016-04-20 NOTE — Progress Notes (Signed)
Pre visit review using our clinic review tool, if applicable. No additional management support is needed unless otherwise documented below in the visit note. 

## 2016-04-21 ENCOUNTER — Telehealth: Payer: Self-pay

## 2016-04-21 MED ORDER — CEPHALEXIN 500 MG PO CAPS: 500.0000 mg | ORAL_CAPSULE | Freq: Four times a day (QID) | ORAL | 0 refills | Status: AC

## 2016-04-21 MED ORDER — EZETIMIBE 10 MG PO TABS: 10.0000 mg | ORAL_TABLET | Freq: Every day | ORAL | 3 refills | Status: DC

## 2016-04-21 MED ORDER — METFORMIN HCL ER 500 MG PO TB24: 500.0000 mg | ORAL_TABLET | Freq: Every day | ORAL | 3 refills | Status: DC

## 2016-04-21 NOTE — Telephone Encounter (Signed)
Sent to new pharmacy

## 2016-04-24 NOTE — Assessment & Plan Note (Signed)
stable overall by history and exam, recent data reviewed with pt, and pt to continue medical treatment as before,  to f/u any worsening symptoms or concerns Lab Results  Component Value Date   CREATININE 1.23 (H) 04/20/2016

## 2016-04-24 NOTE — Assessment & Plan Note (Addendum)
Mild to mod, for otc claritin prn,  to f/u any worsening symptoms or concerns  In addition to the time spent performing CPE, I spent an additional 10 minutes face to face,in which greater than 50% of this time was spent in counseling and coordination of care for patient's illness as documented.

## 2016-04-24 NOTE — Assessment & Plan Note (Signed)
stable overall by history and exam, recent data reviewed with pt, and pt to continue medical treatment as before,  to f/u any worsening symptoms or concerns Lab Results  Component Value Date   HGBA1C 7.8 (H) 04/20/2016

## 2016-04-24 NOTE — Assessment & Plan Note (Signed)

## 2016-04-24 NOTE — Assessment & Plan Note (Signed)
stable overall by history and exam, recent data reviewed with pt, and pt to continue medical treatment as before,  to f/u any worsening symptoms or concerns BP Readings from Last 3 Encounters:  04/20/16 (!) 142/80  04/11/16 (!) 172/65  07/04/15 140/70

## 2016-05-03 ENCOUNTER — Other Ambulatory Visit: Payer: Self-pay | Admitting: Internal Medicine

## 2016-06-22 ENCOUNTER — Encounter: Payer: Self-pay | Admitting: Internal Medicine

## 2016-06-22 ENCOUNTER — Other Ambulatory Visit (INDEPENDENT_AMBULATORY_CARE_PROVIDER_SITE_OTHER): Payer: Medicare Other

## 2016-06-22 DIAGNOSIS — E039 Hypothyroidism, unspecified: Secondary | ICD-10-CM

## 2016-06-22 LAB — T4, FREE: Free T4: 0.81 ng/dL (ref 0.60–1.60)

## 2016-07-05 ENCOUNTER — Other Ambulatory Visit: Payer: Self-pay | Admitting: Surgery

## 2016-07-05 DIAGNOSIS — Z853 Personal history of malignant neoplasm of breast: Secondary | ICD-10-CM

## 2016-07-07 MED FILL — ANASTROZOLE 1 MG TABLET: 1 | 90 days supply | Qty: 90 | Fill #1

## 2016-07-07 MED FILL — EZETIMIBE 10 MG TABLET: 10 | 90 days supply | Qty: 90 | Fill #0

## 2016-07-14 ENCOUNTER — Ambulatory Visit
Admission: RE | Admit: 2016-07-14 | Discharge: 2016-07-14 | Disposition: A | Discharge status: Home / Self Care | Payer: Medicare Other | Source: Ambulatory Visit | Attending: Surgery | Admitting: Surgery

## 2016-07-14 DIAGNOSIS — Z853 Personal history of malignant neoplasm of breast: Secondary | ICD-10-CM

## 2016-07-14 DIAGNOSIS — R928 Other abnormal and inconclusive findings on diagnostic imaging of breast: Secondary | ICD-10-CM | POA: Diagnosis not present

## 2016-07-18 MED FILL — METFORMIN HCL ER 500 MG TAB: 500 | 90 days supply | Qty: 90 | Fill #0

## 2016-07-19 ENCOUNTER — Telehealth: Payer: Self-pay

## 2016-07-19 MED ORDER — ALLOPURINOL 100 MG PO TABS: ORAL_TABLET | ORAL | 0 refills | Status: DC

## 2016-07-19 MED ORDER — FUROSEMIDE 40 MG PO TABS: 40.0000 mg | ORAL_TABLET | Freq: Every morning | ORAL | 0 refills | Status: DC

## 2016-07-19 MED ORDER — LISINOPRIL 20 MG PO TABS: 40.0000 mg | ORAL_TABLET | Freq: Every day | ORAL | 3 refills | Status: DC

## 2016-07-19 MED FILL — LISINOPRIL 20 MG TABLET: 20 | 90 days supply | Qty: 180 | Fill #0

## 2016-07-19 MED FILL — ALLOPURINOL 100 MG TABLET: 100 | 89 days supply | Qty: 135 | Fill #0

## 2016-07-19 MED FILL — FUROSEMIDE 40 MG TABLET: 40 | 90 days supply | Qty: 90 | Fill #0

## 2016-07-19 NOTE — Telephone Encounter (Signed)
New prescription refills sent to new pharmacy

## 2016-09-08 DIAGNOSIS — Z853 Personal history of malignant neoplasm of breast: Secondary | ICD-10-CM | POA: Diagnosis not present

## 2016-10-05 MED FILL — METFORMIN HCL ER 500 MG TAB: 500 | 90 days supply | Qty: 90 | Fill #1

## 2016-10-05 MED FILL — EZETIMIBE 10 MG TABLET: 10 | 90 days supply | Qty: 90 | Fill #1

## 2016-10-05 MED FILL — ANASTROZOLE 1 MG TABLET: 1 | 90 days supply | Qty: 90 | Fill #2

## 2016-11-01 ENCOUNTER — Other Ambulatory Visit: Payer: Self-pay | Admitting: Internal Medicine

## 2016-11-01 MED FILL — FUROSEMIDE 40 MG TABLET: 40 | 90 days supply | Qty: 90 | Fill #0

## 2016-11-01 MED FILL — ALLOPURINOL 100 MG TABLET: 100 | 90 days supply | Qty: 135 | Fill #0

## 2016-11-02 ENCOUNTER — Telehealth: Payer: Self-pay | Admitting: Internal Medicine

## 2016-11-02 ENCOUNTER — Other Ambulatory Visit: Payer: Self-pay | Admitting: *Deleted

## 2016-11-02 MED ORDER — LISINOPRIL 20 MG PO TABS: 40.0000 mg | ORAL_TABLET | Freq: Two times a day (BID) | ORAL | 2 refills | Status: DC

## 2016-11-02 MED ORDER — LISINOPRIL 20 MG PO TABS: 20.0000 mg | ORAL_TABLET | Freq: Two times a day (BID) | ORAL | 2 refills | Status: DC

## 2016-11-02 MED FILL — LISINOPRIL 20 MG TAB: 20 | 90 days supply | Qty: 180 | Fill #0

## 2016-11-02 NOTE — Telephone Encounter (Signed)
rx corrected 

## 2016-11-02 NOTE — Telephone Encounter (Signed)
Please advise 

## 2016-11-02 NOTE — Telephone Encounter (Signed)
Duplicate request Lisinopril has already been sent...Johny Chess

## 2016-11-02 NOTE — Addendum Note (Signed)
Addended by: Biagio Borg on: 11/02/2016 02:00 PM   Modules accepted: Orders

## 2016-11-02 NOTE — Telephone Encounter (Signed)
Ok for potassium at 1 pill twice per day - done erx

## 2016-11-02 NOTE — Telephone Encounter (Signed)
Called [facility] and they have the correct script on file that PCP had corrected and sent over for the 90d supply. It must have not been in the system when the called to follow up on it.

## 2016-11-02 NOTE — Telephone Encounter (Signed)
[facility] out patient would like clarification  The pt lisinopril yesterday was sent for one tablets twice a day and today it was sent for 2 tablets 2x a day. If the new directions are correct they would like a 90 supply sent over the qt of 180 with the new directions is a 45 day supply   [facility] [phone]

## 2016-11-02 NOTE — Telephone Encounter (Signed)
Pt would like a quanity of 90 for lisinopril (PRINIVIL,ZESTRIL) 20 MG tablet  ,the pharmacy said to either send 180 2x per day or a qt of 40 1x per day.  [facility]  Outpatient

## 2016-11-15 ENCOUNTER — Ambulatory Visit (INDEPENDENT_AMBULATORY_CARE_PROVIDER_SITE_OTHER): Payer: Medicare Other | Admitting: Internal Medicine

## 2016-11-15 ENCOUNTER — Encounter: Payer: Self-pay | Admitting: Internal Medicine

## 2016-11-15 ENCOUNTER — Other Ambulatory Visit (INDEPENDENT_AMBULATORY_CARE_PROVIDER_SITE_OTHER): Payer: Medicare Other

## 2016-11-15 ENCOUNTER — Other Ambulatory Visit: Payer: Self-pay | Admitting: Internal Medicine

## 2016-11-15 VITALS — BP 164/94 | HR 74 | Ht 65.0 in | Wt 222.0 lb

## 2016-11-15 DIAGNOSIS — R7989 Other specified abnormal findings of blood chemistry: Secondary | ICD-10-CM

## 2016-11-15 DIAGNOSIS — E119 Type 2 diabetes mellitus without complications: Secondary | ICD-10-CM

## 2016-11-15 DIAGNOSIS — R946 Abnormal results of thyroid function studies: Secondary | ICD-10-CM

## 2016-11-15 DIAGNOSIS — R300 Dysuria: Secondary | ICD-10-CM

## 2016-11-15 DIAGNOSIS — N183 Chronic kidney disease, stage 3 unspecified: Secondary | ICD-10-CM

## 2016-11-15 DIAGNOSIS — E785 Hyperlipidemia, unspecified: Secondary | ICD-10-CM | POA: Diagnosis not present

## 2016-11-15 DIAGNOSIS — I10 Essential (primary) hypertension: Secondary | ICD-10-CM | POA: Diagnosis not present

## 2016-11-15 LAB — HEMOGLOBIN A1C: Hgb A1c MFr Bld: 7.3 % — ABNORMAL HIGH (ref 4.6–6.5)

## 2016-11-15 LAB — BASIC METABOLIC PANEL
BUN: 43 mg/dL — ABNORMAL HIGH (ref 6–23)
CO2: 31 mEq/L (ref 19–32)
Calcium: 9.9 mg/dL (ref 8.4–10.5)
Chloride: 102 mEq/L (ref 96–112)
Creatinine, Ser: 1.48 mg/dL — ABNORMAL HIGH (ref 0.40–1.20)
GFR: 35.65 mL/min — ABNORMAL LOW (ref >60.00)
Glucose, Bld: 127 mg/dL — ABNORMAL HIGH (ref 70–99)
Potassium: 4.5 mEq/L (ref 3.5–5.1)
Sodium: 141 mEq/L (ref 135–145)

## 2016-11-15 LAB — HEPATIC FUNCTION PANEL
ALT: 15 U/L (ref 0–35)
AST: 24 U/L (ref 0–37)
Albumin: 3.9 g/dL (ref 3.5–5.2)
Alkaline Phosphatase: 80 U/L (ref 39–117)
Bilirubin, Direct: 0.1 mg/dL (ref 0.0–0.3)
Total Bilirubin: 0.7 mg/dL (ref 0.2–1.2)
Total Protein: 7.5 g/dL (ref 6.0–8.3)

## 2016-11-15 LAB — URINALYSIS, ROUTINE W REFLEX MICROSCOPIC
Bilirubin Urine: NEGATIVE
Ketones, ur: NEGATIVE
Nitrite: POSITIVE — AB
Specific Gravity, Urine: 1.02 (ref 1.000–1.030)
Total Protein, Urine: NEGATIVE
Urine Glucose: NEGATIVE
Urobilinogen, UA: 0.2 (ref 0.0–1.0)
pH: 6 (ref 5.0–8.0)

## 2016-11-15 LAB — LIPID PANEL
Cholesterol: 170 mg/dL (ref 0–200)
HDL: 39.5 mg/dL (ref >39.00)
LDL Cholesterol: 91 mg/dL (ref 0–99)
NonHDL: 130.91
Total CHOL/HDL Ratio: 4
Triglycerides: 200 mg/dL — ABNORMAL HIGH (ref 0.0–149.0)
VLDL: 40 mg/dL (ref 0.0–40.0)

## 2016-11-15 LAB — TSH: TSH: 5.74 u[IU]/mL — ABNORMAL HIGH (ref 0.35–4.50)

## 2016-11-15 LAB — T4, FREE: Free T4: 0.72 ng/dL (ref 0.60–1.60)

## 2016-11-15 MED ORDER — AMLODIPINE BESYLATE 5 MG PO TABS: 5.0000 mg | ORAL_TABLET | Freq: Every day | ORAL | 3 refills | Status: DC

## 2016-11-15 MED ORDER — NITROFURANTOIN MACROCRYSTAL 50 MG PO CAPS: 50.0000 mg | ORAL_CAPSULE | Freq: Two times a day (BID) | ORAL | 0 refills | Status: DC

## 2016-11-15 MED FILL — AMLODIPINE BESYLATE 5 MG TA: 5 | 90 days supply | Qty: 90 | Fill #0

## 2016-11-15 NOTE — Progress Notes (Signed)
Subjective:    Patient ID: [name], female    DOB: July 10, 1929, 83 y.o.   MRN: [id number]  HPI  Here to f/u; overall doing ok,  Pt denies chest pain, increasing sob or doe, wheezing, orthopnea, PND, increased LE swelling, palpitations, dizziness or syncope.  Pt denies new neurological symptoms such as new headache, or facial or extremity weakness or numbness.  Pt denies polydipsia, polyuria, or low sugar episode.   Pt denies new neurological symptoms such as new headache, or facial or extremity weakness or numbness.   Pt states overall good compliance with meds, mostly trying to follow appropriate diet, with wt overall down over 10 lbs,  but little exercise however, walks with walker. She states due to lower calorie intake recently trying to do better with her diet and DM. Denies hyper or hypo thyroid symptoms such as voice, skin or hair change. Denies urinary symptoms such as frequency, urgency, flank pain, hematuria or n/v, fever, chills., but has had some dysuria x 5 days. Wt Readings from Last 3 Encounters:  11/15/16 222 lb (100.7 kg)  04/20/16 232 lb 4 oz (105.3 kg)  04/11/16 233 lb (105.7 kg)   BP Readings from Last 3 Encounters:  11/15/16 (!) 164/94  04/20/16 (!) 142/80  04/11/16 (!) 172/65   Past Medical History:  Diagnosis Date  . Anemia   . Anxiety   . Atrial flutter ([place])    "sometimes"  . Blood transfusion   . Breast cancer ([place])    right breast  . Bronchitis   . Chronic kidney disease    occ uti's  . CKD (chronic kidney disease) stage 3, GFR 30-59 ml/min   . Colon polyps    hyperplastic  . CVA (cerebral infarction)   . Diverticulosis   . DVT of leg (deep venous thrombosis) ([name])    left; S/P OR  . Dysrhythmia    dr bensimhon   . Esophageal stricture   . GERD (gastroesophageal reflux disease)   . Gout   . Hiatal hernia    4 cm  . History of radiation therapy 02/15/12-04/02/12   right breast 4680 cGy/26 sessions, right boost=1400cGy /7 sessions  .  Hyperlipidemia   . Hypertension   . IBS (irritable bowel syndrome)   . Kidney stones 1990's  . Morbid obesity ([place])   . Osteoarthritis   . Pneumonia    "walking"  . PONV (postoperative nausea and vomiting)   . Shingles 02/21/2011  . Shortness of breath on exertion   . Skin cancer   . Stroke [facility]) 2002   residual:  "little tingling in palm of left hand"  . UTI (lower urinary tract infection)    "I've had a few"   Past Surgical History:  Procedure Laterality Date  . ABDOMINAL HYSTERECTOMY    . BILE DUCT STENT PLACEMENT  ~ 01/2011  . BREAST LUMPECTOMY  06/29/11   right  . BREAST LUMPECTOMY  06/29/2011   Procedure: BREAST LUMPECTOMY WITH EXCISION OF SENTINEL NODE;  Surgeon: Pedro Earls, MD;  Location: [place];  Service: General;  Laterality: Right;  right sentinel node mapping,right sentinel node biopsy, needle localization right breast lumpectomy  . CATARACT EXTRACTION W/ INTRAOCULAR LENS  IMPLANT, BILATERAL  1990's  . CHOLECYSTECTOMY  03/23/11   lap chole   . COLON SURGERY     hole in intestine ,tumors?  . colonic perforation repair  ~ 2000  . [place]  2012  . INCISION AND DRAINAGE ABSCESS N/A 08/12/2013  Procedure: INCISION AND DRAINAGE ABSCESS;  Surgeon: Earnstine Regal, MD;  [facility];  Service: General;  Laterality: N/A;  . INCISION AND DRAINAGE ABSCESS N/A 08/20/2013   Procedure: INCISION AND DRAINAGE ABSCESS;  Surgeon: Ralene Ok, MD;  [facility];  Service: General;  Laterality: N/A;  . INCISION AND DRAINAGE BREAST ABSCESS     right breast seroma  . IRRIGATION AND DEBRIDEMENT ABSCESS N/A 08/14/2013   Procedure: Debridment of perineal tissue and debridement of perineal wound;  Surgeon: Earnstine Regal, MD;  [facility];  Service: General;  Laterality: N/A;  . IRRIGATION AND DEBRIDEMENT ABSCESS N/A 08/16/2013   Procedure: DRESSING CHANGE AND DEBRIDEMENT OF PERINEUM WOUND;  Surgeon: Earnstine Regal, MD;  [facility];  Service: General;  Laterality:  N/A;  . JOINT REPLACEMENT    . MINOR APPLICATION OF WOUND VAC N/A 08/20/2013   Procedure: MINOR APPLICATION OF WOUND VAC;  Surgeon: Ralene Ok, MD;  [facility];  Service: General;  Laterality: N/A;  . pelvic bone tumor removal     twice in 2000's  . PILONIDAL CYST DRAINAGE N/A 08/19/2013   Procedure: Dressing change perineum;  Surgeon: Ralene Ok, MD;  [facility];  Service: General;  Laterality: N/A;  . REPLACEMENT TOTAL KNEE BILATERAL  ~ 2008; ~ 2010   right; left  . TONSILLECTOMY  ~ 1946  . TOTAL HIP ARTHROPLASTY  1980's   right    reports that she has never smoked. She has never used smokeless tobacco. She reports that she does not drink alcohol or use drugs. family history includes Breast cancer in her sister; Diabetes in her sister; Heart disease in her maternal uncle and paternal uncle; Kidney failure in her sister; Prostate cancer in her brother; Stroke in her mother. Allergies  Allergen Reactions  . Ace Inhibitors Other (See Comments)    Tolerates lisinopril at home. Pt does not recall allergy.  . Atorvastatin     REACTION: myalgias  . Oxycodone-Acetaminophen     REACTION: confusion, fatigue  . Rosuvastatin     REACTION: leg weakness  . Simvastatin     REACTION: leg weakness  . Codeine Nausea And Vomiting and Rash  . Sulfonamide Derivatives Hives and Rash   Current Outpatient Prescriptions on File Prior to Visit  Medication Sig Dispense Refill  . allopurinol (ZYLOPRIM) 100 MG tablet TAKE 2 TABLETS BY MOUTH EVERY OTHER DAY AND 1 TABLET EVERY OTHER DAY 135 tablet 1  . anastrozole (ARIMIDEX) 1 MG tablet Take 1 tablet (1 mg total) by mouth daily. 90 tablet 4  . aspirin 325 MG EC tablet Take 1 tablet (325 mg total) by mouth every other day.    . cholecalciferol (VITAMIN D) 1000 UNITS tablet Take 1,000 Units by mouth every morning.     . ezetimibe (ZETIA) 10 MG tablet Take 1 tablet (10 mg total) by mouth daily. 90 tablet 3  . furosemide (LASIX) 40 MG tablet  TAKE 1 TABLET BY MOUTH EVERY MORNING. 90 tablet 1  . lisinopril (PRINIVIL,ZESTRIL) 20 MG tablet Take 1 tablet (20 mg total) by mouth 2 (two) times daily. 180 tablet 2  . metFORMIN (GLUCOPHAGE-XR) 500 MG 24 hr tablet Take 1 tablet (500 mg total) by mouth daily with breakfast. 90 tablet 3  . pantoprazole (PROTONIX) 40 MG tablet Take 1 tablet (40 mg total) by mouth daily. 30 tablet 0  . traMADol-acetaminophen (ULTRACET) 37.5-325 MG tablet TAKE ONE TABLET BY MOUTH EVERY 6 HOURS AS NEEDED 120 tablet  2   No current facility-administered medications on file prior to visit.    Review of Systems  Constitutional: Negative for other unusual diaphoresis or sweats HENT: Negative for ear discharge or swelling Eyes: Negative for other worsening visual disturbances Respiratory: Negative for stridor or other swelling  Gastrointestinal: Negative for worsening distension or other blood Genitourinary: Negative for retention or other urinary change Musculoskeletal: Negative for other MSK pain or swelling Skin: Negative for color change or other new lesions Neurological: Negative for worsening tremors and other numbness  Psychiatric/Behavioral: Negative for worsening agitation or other fatigue All other system neg per pt    Objective:   Physical Exam BP (!) 164/94   Pulse 74   Ht 5\' 5"  (1.651 m)   Wt 222 lb (100.7 kg)   SpO2 98%   BMI 36.94 kg/m  VS noted, obese Constitutional: Pt appears in NAD HENT: Head: NCAT.  Right Ear: External ear normal.  Left Ear: External ear normal.  Eyes: . Pupils are equal, round, and reactive to light. Conjunctivae and EOM are normal Nose: without d/c or deformity Neck: Neck supple. Gross normal ROM Cardiovascular: Normal rate and regular rhythm.   Pulmonary/Chest: Effort normal and breath sounds without rales or wheezing.  Abd:  Soft, NT, ND, + BS, no organomegaly Neurological: Pt is alert. At baseline orientation, motor grossly intact Skin: Skin is warm. No  rashes, other new lesions, no LE edema Psychiatric: Pt behavior is normal without agitation   Lab Results  Component Value Date   WBC 10.1 04/20/2016   HGB 14.1 04/20/2016   HCT 43.0 04/20/2016   PLT 203.0 04/20/2016   GLUCOSE 141 (H) 04/20/2016   CHOL 182 04/20/2016   TRIG 209.0 (H) 04/20/2016   HDL 38.30 (L) 04/20/2016   LDLDIRECT 126.0 04/20/2016   ALT 19 04/20/2016   AST 27 04/20/2016   NA 144 04/20/2016   K 4.5 04/20/2016   CL 102 04/20/2016   CREATININE 1.23 (H) 04/20/2016   BUN 35 (H) 04/20/2016   CO2 34 (H) 04/20/2016   TSH 0.15 (L) 04/20/2016   INR 1.05 06/18/2014   HGBA1C 7.8 (H) 04/20/2016   MICROALBUR 4.1 (H) 04/20/2016       Assessment & Plan:

## 2016-11-15 NOTE — Assessment & Plan Note (Signed)
Uncontrolled, to add amlod 5 qd,  to f/u any worsening symptoms or concerns

## 2016-11-15 NOTE — Assessment & Plan Note (Signed)
stable overall by history and exam, recent data reviewed with pt, and pt to continue medical treatment as before,  to f/u any worsening symptoms or concerns Lab Results  Component Value Date   HGBA1C 7.8 (H) 04/20/2016

## 2016-11-15 NOTE — Assessment & Plan Note (Signed)
With goal < 70, for f/u lab, low chol diet, cont zetia /statin intolerant

## 2016-11-15 NOTE — Assessment & Plan Note (Signed)
Unclear if related to recent wt loss; for f/u lab today

## 2016-11-15 NOTE — Assessment & Plan Note (Signed)
[place] for f/u Urine studies

## 2016-11-15 NOTE — Assessment & Plan Note (Signed)
Lab Results  Component Value Date   CREATININE 1.23 (H) 04/20/2016   stable overall by history and exam, recent data reviewed with pt, and pt to continue medical treatment as before,  to f/u any worsening symptoms or concerns]

## 2016-11-15 NOTE — Patient Instructions (Addendum)
Please take all new medication as prescribed - the amlodipine 5 mg for blood pressure  Please continue all other medications as before, and refills have been done if requested.  Please have the pharmacy call with any other refills you may need.  Please continue your efforts at being more active, low cholesterol diet, and weight control.  You are otherwise up to date with prevention measures today.  Please keep your appointments with your specialists as you may have planned  Please go to the LAB in the Basement (turn left off the elevator) for the tests to be done today  You will be contacted by phone if any changes need to be made immediately.  Otherwise, you will receive a letter about your results with an explanation, but please check with MyChart first.  Please remember to sign up for MyChart if you have not done so, as this will be important to you in the future with finding out test results, communicating by private email, and scheduling acute appointments online when needed.  Please return in 6 months, or sooner if needed, with Lab testing done 3-5 days before

## 2016-11-16 ENCOUNTER — Telehealth: Payer: Self-pay

## 2016-11-16 MED FILL — NITROFURANTOIN MCR 50 MG CA: 50 | 10 days supply | Qty: 20 | Fill #0

## 2016-11-16 NOTE — Telephone Encounter (Signed)
Informed patient of results and she expressed understanding.

## 2016-11-16 NOTE — Telephone Encounter (Signed)
-----   Message from Biagio Borg, MD sent at 11/15/2016  6:51 PM EDT ----- Letter sent, cont same tx except  The test results show that your current treatment is OK, except the urine testing seems consistent with possible infection.  We will send an antibiotic to your pharmacy, and you should be called from the office about this as well.    [name] to please inform pt, I will do rx

## 2016-11-17 LAB — URINE CULTURE

## 2016-12-07 ENCOUNTER — Ambulatory Visit: Payer: Medicare Other | Admitting: Internal Medicine

## 2017-01-17 MED FILL — FUROSEMIDE 40 MG TAB: 40 | 90 days supply | Qty: 90 | Fill #1

## 2017-01-17 MED FILL — METFORMIN HCL ER 500 MG TAB: 500 | 90 days supply | Qty: 90 | Fill #2

## 2017-01-17 MED FILL — EZETIMIBE 10 MG TABLET: 10 | 90 days supply | Qty: 90 | Fill #2

## 2017-01-17 MED FILL — ANASTROZOLE 1 MG TABLET: 1 | 60 days supply | Qty: 60 | Fill #3

## 2017-01-17 MED FILL — AMLODIPINE BESYLATE 5 MG TA: 5 | 90 days supply | Qty: 90 | Fill #1

## 2017-01-17 MED FILL — ALLOPURINOL 100 MG TABLET: 100 | 90 days supply | Qty: 135 | Fill #1

## 2017-01-27 MED FILL — LISINOPRIL 20 MG TAB: 20 | 90 days supply | Qty: 180 | Fill #1

## 2017-03-01 DIAGNOSIS — S99822A Other specified injuries of left foot, initial encounter: Secondary | ICD-10-CM | POA: Diagnosis not present

## 2017-04-06 NOTE — Progress Notes (Signed)
[name]  [phone] [phone]    ID: [name]   DOB: 05/12/1930  MR#: [id number]  PPJ#:[id number]  PCP: Biagio Borg, MD  Patient Care Team: Biagio Borg, MD as PCP - General Len Azeez, Virgie Dad, MD as Consulting Physician (Oncology)  GYN:  None  SU: Dr. Rockne Coons   CHIEF COMPLAINT: right breast cancer estrogen receptor positive  CURRENT TREATMENT: anastrozole  BREAST CANCER HISTORY: From the original intake note:   [name] is an 83 year old [place] woman who underwent screening mammography in December of 2012 and a follow up right mammogram was recommended.  The mammogram and ultrasound demonstrated a area of abnormality 7 cm from the nipple measuring 1.5 x 1.3 x 1.1 cm with ultrasound of the axilla negative.  A biopsy of the mass performed on 06/09/2011 showed invasive ductal carcinoma, grade I-II,  ER + 100%, PR +100%, Ki-67 9%, HER-2 negative with ratio of 1.20.  MRI scans of the breasts on 06/17/2011 showed a mass measuring 2.8 x 1.7 x 1.3 cm with no other abnormalities seen. The patient underwent a right lumpectomy with sentinel lymph node evaluation on 06/29/2011 by Dr. Hassell Done with final pathology showing a 2.8 cm, grade 1 invasive ductal carcinoma with no evidence of angiolymphatic invasion with closest surgical margins of 0.2cm. One sentinel lymph node identified was negative for malignancy.  The patient's postoperative course was complicated by a hematoma and she completed a course of antibiotics.  She completed radiation under the care of Dr. Valere Dross from 02/15/12 to 04/02/12.  She was started on Arimidex 1 mg daily in 04/2012.  INTERVAL HISTORY:   [name] returns today for follow-up and treatment of her estrogen receptor positive breast cancer.  She recently finished taking anastrozole and reports that she is sleeping better. She also notes that her arthritis has not changed and she denies any hot flashes throughout her time on  the medication.   REVIEW OF SYSTEMS: [name] is doing well except for her arthritits. She notes her back is weak, but not sure if it is because of her past surgery. In her free time she will cook and watch tv. She is unable to stand on her feet for a long period of time because of her neuropathy. She denies unusual headaches, visual changes, nausea, vomiting, or dizziness. There has been no unusual cough, phlegm production, or pleurisy. This been no change in bowel or bladder habits. She denies unexplained fatigue or unexplained weight loss, bleeding, rash, or fever. A detailed review of systems was otherwise entirely stable.   PAST MEDICAL HISTORY: Past Medical History:  Diagnosis Date  . Anemia   . Anxiety   . Atrial flutter ([place])    "sometimes"  . Blood transfusion   . Breast cancer ([place])    right breast  . Bronchitis   . Chronic kidney disease    occ uti's  . CKD (chronic kidney disease) stage 3, GFR 30-59 ml/min   . Colon polyps    hyperplastic  . CVA (cerebral infarction)   . Diverticulosis   . DVT of leg (deep venous thrombosis) ([name])    left; S/P OR  . Dysrhythmia    dr bensimhon   . Esophageal stricture   . GERD (gastroesophageal reflux disease)   . Gout   . Hiatal hernia    4 cm  . History of radiation therapy 02/15/12-04/02/12   right breast 4680 cGy/26 sessions, right boost=1400cGy /7 sessions  . Hyperlipidemia   .  Hypertension   . IBS (irritable bowel syndrome)   . Kidney stones 1990's  . Morbid obesity ([place])   . Osteoarthritis   . Pneumonia    "walking"  . PONV (postoperative nausea and vomiting)   . Shingles 02/21/2011  . Shortness of breath on exertion   . Skin cancer   . Stroke [facility]) 2002   residual:  "little tingling in palm of left hand"  . UTI (lower urinary tract infection)    "I've had a few"    PAST SURGICAL HISTORY: Past Surgical History:  Procedure Laterality Date  . ABDOMINAL HYSTERECTOMY    . BILE DUCT STENT PLACEMENT  ~ 01/2011  . BREAST  LUMPECTOMY  06/29/11   right  . BREAST LUMPECTOMY  06/29/2011   Procedure: BREAST LUMPECTOMY WITH EXCISION OF SENTINEL NODE;  Surgeon: Pedro Earls, MD;  Location: [place];  Service: General;  Laterality: Right;  right sentinel node mapping,right sentinel node biopsy, needle localization right breast lumpectomy  . CATARACT EXTRACTION W/ INTRAOCULAR LENS  IMPLANT, BILATERAL  1990's  . CHOLECYSTECTOMY  03/23/11   lap chole   . COLON SURGERY     hole in intestine ,tumors?  . colonic perforation repair  ~ 2000  . [place]  2012  . INCISION AND DRAINAGE ABSCESS N/A 08/12/2013   Procedure: INCISION AND DRAINAGE ABSCESS;  Surgeon: Earnstine Regal, MD;  [facility];  Service: General;  Laterality: N/A;  . INCISION AND DRAINAGE ABSCESS N/A 08/20/2013   Procedure: INCISION AND DRAINAGE ABSCESS;  Surgeon: Ralene Ok, MD;  [facility];  Service: General;  Laterality: N/A;  . INCISION AND DRAINAGE BREAST ABSCESS     right breast seroma  . IRRIGATION AND DEBRIDEMENT ABSCESS N/A 08/14/2013   Procedure: Debridment of perineal tissue and debridement of perineal wound;  Surgeon: Earnstine Regal, MD;  [facility];  Service: General;  Laterality: N/A;  . IRRIGATION AND DEBRIDEMENT ABSCESS N/A 08/16/2013   Procedure: DRESSING CHANGE AND DEBRIDEMENT OF PERINEUM WOUND;  Surgeon: Earnstine Regal, MD;  [facility];  Service: General;  Laterality: N/A;  . JOINT REPLACEMENT    . MINOR APPLICATION OF WOUND VAC N/A 08/20/2013   Procedure: MINOR APPLICATION OF WOUND VAC;  Surgeon: Ralene Ok, MD;  [facility];  Service: General;  Laterality: N/A;  . pelvic bone tumor removal     twice in 2000's  . PILONIDAL CYST DRAINAGE N/A 08/19/2013   Procedure: Dressing change perineum;  Surgeon: Ralene Ok, MD;  [facility];  Service: General;  Laterality: N/A;  . REPLACEMENT TOTAL KNEE BILATERAL  ~ 2008; ~ 2010   right; left  . TONSILLECTOMY  ~ 1946  . TOTAL HIP ARTHROPLASTY  1980's    right    FAMILY HISTORY Family History  Problem Relation Age of Onset  . Breast cancer Sister        x 2  . Diabetes Sister   . Prostate cancer Brother   . Colon cancer Neg Hx   . Stroke Mother   . Kidney failure Sister   . Heart disease Paternal Uncle        multiple  . Heart disease Maternal Uncle        multiple    GYNECOLOGIC HISTORY:  G 5 P 2, menarche-15, menopause at time of hysterectomy, no long term use of HRT  SOCIAL HISTORY:  (Updated 04/11/17) She lives at home with her husband and has been married for over 29 years.  She has two sons: one who lives in [place] and one who lives 2 miles away from her.  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    ADVANCED DIRECTIVES:  Living will.  HEALTH MAINTENANCE: Social History  Substance Use Topics  . Smoking status: Never Smoker  . Smokeless tobacco: Never Used  . Alcohol use No     Colonoscopy:  Last year due to rectal bleeding, which was related to hemorrhoids  PAP:  Has had hysterectomy  Bone density:  08/2011 resulting normal  Lipid panel:  04/2012 with total cholesterol of 219, trig 276, HDL 31.30, LDL 55.2  Allergies  Allergen Reactions  . Ace Inhibitors Other (See Comments)    Tolerates lisinopril at home. Pt does not recall allergy.  . Atorvastatin     REACTION: myalgias  . Oxycodone-Acetaminophen     REACTION: confusion, fatigue  . Rosuvastatin     REACTION: leg weakness  . Simvastatin     REACTION: leg weakness  . Codeine Nausea And Vomiting and Rash  . Sulfonamide Derivatives Hives and Rash    Current Outpatient Prescriptions  Medication Sig Dispense Refill  . allopurinol (ZYLOPRIM) 100 MG tablet TAKE 2 TABLETS BY MOUTH EVERY OTHER DAY AND 1 TABLET EVERY OTHER DAY 135 tablet 1  . amLODipine (NORVASC) 5 MG tablet Take 1 tablet (5 mg total) by mouth daily. 90 tablet 3  . anastrozole (ARIMIDEX) 1 MG tablet Take 1 tablet  (1 mg total) by mouth daily. 90 tablet 4  . aspirin 325 MG EC tablet Take 1 tablet (325 mg total) by mouth every other day.    . cholecalciferol (VITAMIN D) 1000 UNITS tablet Take 1,000 Units by mouth every morning.     . ezetimibe (ZETIA) 10 MG tablet Take 1 tablet (10 mg total) by mouth daily. 90 tablet 3  . furosemide (LASIX) 40 MG tablet TAKE 1 TABLET BY MOUTH EVERY MORNING. 90 tablet 1  . lisinopril (PRINIVIL,ZESTRIL) 20 MG tablet Take 1 tablet (20 mg total) by mouth 2 (two) times daily. 180 tablet 2  . metFORMIN (GLUCOPHAGE-XR) 500 MG 24 hr tablet TAKE 1 TABLET BY MOUTH ONCE DAILY WITH BREAKFAST 90 tablet 0  . nitrofurantoin (MACRODANTIN) 50 MG capsule Take 1 capsule (50 mg total) by mouth 2 (two) times daily. 20 capsule 0  . pantoprazole (PROTONIX) 40 MG tablet Take 1 tablet (40 mg total) by mouth daily. 30 tablet 0  . traMADol-acetaminophen (ULTRACET) 37.5-325 MG tablet TAKE ONE TABLET BY MOUTH EVERY 6 HOURS AS NEEDED 120 tablet 2   No current facility-administered medications for this visit.     OBJECTIVE: Elderly white woman who appears stated age 54:   04/11/17 1336  BP: (!) 158/56  Pulse: 81  Resp: 18  Temp: 98.2 F (36.8 C)  SpO2: 96%     Body mass index is 35.69 kg/m.    ECOG FS: 1   Sclerae unicteric, pupils round and equal Oropharynx clear and moist No cervical or supraclavicular adenopathy Lungs no rales or rhonchi Heart regular rate and rhythm Abd soft, nontender, positive bowel sounds MSK kyphosis but no focal spinal tenderness, no upper extremity lymphedema Neuro: nonfocal, well oriented, appropriate affect Breasts: The right breast is status post lumpectomy.  There is no evidence of local recurrence.  The left breast is benign.  Both axillae are benign.  LAB RESULTS: Lab Results  Component Value Date  WBC 8.5 04/11/2017   NEUTROABS 5.4 04/11/2017   HGB 12.7 04/11/2017   HCT 39.3 04/11/2017   MCV 93.0 04/11/2017   PLT 212 04/11/2017       Chemistry      Component Value Date/Time   NA 141 11/15/2016 1117   NA 144 04/11/2016 0948   K 4.5 11/15/2016 1117   K 4.9 04/11/2016 0948   CL 102 11/15/2016 1117   CO2 31 11/15/2016 1117   CO2 30 (H) 04/11/2016 0948   BUN 43 (H) 11/15/2016 1117   BUN 42.7 (H) 04/11/2016 0948   CREATININE 1.48 (H) 11/15/2016 1117   CREATININE 1.3 (H) 04/11/2016 0948      Component Value Date/Time   CALCIUM 9.9 11/15/2016 1117   CALCIUM 10.4 04/11/2016 0948   ALKPHOS 80 11/15/2016 1117   ALKPHOS 122 04/11/2016 0948   AST 24 11/15/2016 1117   AST 30 04/11/2016 0948   ALT 15 11/15/2016 1117   ALT 23 04/11/2016 0948   BILITOT 0.7 11/15/2016 1117   BILITOT 0.59 04/11/2016 0948       Lab Results  Component Value Date   LABCA2 20 08/01/2011    No components found for: [id number]  No results for input(s): INR in the last 168 hours.  Urinalysis    Component Value Date/Time   COLORURINE YELLOW 11/15/2016 1117   APPEARANCEUR CLEAR 11/15/2016 1117   LABSPEC 1.020 11/15/2016 1117   PHURINE 6.0 11/15/2016 1117   GLUCOSEU NEGATIVE 11/15/2016 1117   HGBUR TRACE-INTACT (A) 11/15/2016 1117   BILIRUBINUR NEGATIVE 11/15/2016 1117   BILIRUBINUR neg 12/09/2014 1506   KETONESUR NEGATIVE 11/15/2016 1117   PROTEINUR neg 12/09/2014 1506   PROTEINUR NEGATIVE 06/18/2014 0334   UROBILINOGEN 0.2 11/15/2016 1117   NITRITE POSITIVE (A) 11/15/2016 1117   LEUKOCYTESUR MODERATE (A) 11/15/2016 1117    STUDIES: Diagnostic Bilateral Breast Mammogram [facility], 07/14/2016, breast density category B, shows stable post lumpectomy changes to the right breast.  ASSESSMENT: 82 y.o. [place] woman:  #1 S/p right lumpectomy and sentinel lymph node biopsy on 06/29/11 for a pT2 pN0, stage IIA IDC Grade I, ER+, PR+, Ki67 9%, HER2 -.  #2 She completed radiation therapy from 02/25/12 to 04/02/12.  #3 She has been on Arimidex 1 mg daily since 04/2012. She had a normal bone density March of 2013  PLAN:   [name] is now  almost 6 years out from definitive surgery for her breast cancer with no evidence of disease recurrence.  This is very favorable.  She has completed 5 years of Arimidex.  She understands that continuing beyond 5 years in her case would offer essentially no benefit  At this point I am comfortable releasing her to her primary care physician.  Will she will need in terms of breast cancer follow-up is a yearly mammogram, which she usually obtains in February, and a yearly physician breast exam.  I will be glad to see her at any point in the future if and when the need arises but as of now are making no further routine appointment for her here.  Corbett Moulder, Virgie Dad, MD  04/11/17 1:46 PM Medical Oncology and Hematology [facility] [address] [place], [place] [address] Tel. [phone]    Fax. [phone]  This document serves as a record of services personally performed by Chauncey Cruel, MD. It was created on his behalf by Margit Banda, a trained medical scribe. The creation of this record is based on the scribe's personal observations  and the provider's statements to them. This document has been checked and approved by the attending provider.

## 2017-04-10 ENCOUNTER — Other Ambulatory Visit: Payer: Self-pay | Admitting: *Deleted

## 2017-04-10 ENCOUNTER — Other Ambulatory Visit: Payer: Self-pay | Admitting: Internal Medicine

## 2017-04-10 DIAGNOSIS — C50411 Malignant neoplasm of upper-outer quadrant of right female breast: Secondary | ICD-10-CM

## 2017-04-10 MED FILL — METFORMIN HCL ER 500 MG TAB: 500 | 90 days supply | Qty: 90 | Fill #0

## 2017-04-11 ENCOUNTER — Ambulatory Visit (HOSPITAL_BASED_OUTPATIENT_CLINIC_OR_DEPARTMENT_OTHER): Payer: Medicare Other | Admitting: Oncology

## 2017-04-11 ENCOUNTER — Other Ambulatory Visit (HOSPITAL_BASED_OUTPATIENT_CLINIC_OR_DEPARTMENT_OTHER): Payer: Medicare Other

## 2017-04-11 VITALS — BP 158/56 | HR 81 | Temp 98.2°F | Resp 18 | Ht 65.0 in | Wt 214.5 lb

## 2017-04-11 DIAGNOSIS — Z170 Estrogen receptor positive status [ER+]: Principal | ICD-10-CM

## 2017-04-11 DIAGNOSIS — Z853 Personal history of malignant neoplasm of breast: Secondary | ICD-10-CM

## 2017-04-11 DIAGNOSIS — C50411 Malignant neoplasm of upper-outer quadrant of right female breast: Secondary | ICD-10-CM

## 2017-04-11 LAB — CBC WITH DIFFERENTIAL/PLATELET
BASO%: 0.9 % (ref 0.0–2.0)
Basophils Absolute: 0.1 10*3/uL (ref 0.0–0.1)
EOS%: 3 % (ref 0.0–7.0)
Eosinophils Absolute: 0.3 10*3/uL (ref 0.0–0.5)
HCT: 39.3 % (ref 34.8–46.6)
HGB: 12.7 g/dL (ref 11.6–15.9)
LYMPH%: 23.3 % (ref 14.0–49.7)
MCH: 30.2 pg (ref 25.1–34.0)
MCHC: 32.4 g/dL (ref 31.5–36.0)
MCV: 93 fL (ref 79.5–101.0)
MONO#: 0.7 10*3/uL (ref 0.1–0.9)
MONO%: 8.6 % (ref 0.0–14.0)
NEUT#: 5.4 10*3/uL (ref 1.5–6.5)
NEUT%: 64.2 % (ref 38.4–76.8)
Platelets: 212 10*3/uL (ref 145–400)
RBC: 4.22 10*6/uL (ref 3.70–5.45)
RDW: 15.2 % — ABNORMAL HIGH (ref 11.2–14.5)
WBC: 8.5 10*3/uL (ref 3.9–10.3)
lymph#: 2 10*3/uL (ref 0.9–3.3)

## 2017-04-11 LAB — COMPREHENSIVE METABOLIC PANEL
ALT: 23 U/L (ref 0–55)
AST: 34 U/L (ref 5–34)
Albumin: 3.3 g/dL — ABNORMAL LOW (ref 3.5–5.0)
Alkaline Phosphatase: 81 U/L (ref 40–150)
Anion Gap: 11 mEq/L (ref 3–11)
BUN: 36.6 mg/dL — ABNORMAL HIGH (ref 7.0–26.0)
CO2: 29 mEq/L (ref 22–29)
Calcium: 10.2 mg/dL (ref 8.4–10.4)
Chloride: 105 mEq/L (ref 98–109)
Creatinine: 1.6 mg/dL — ABNORMAL HIGH (ref 0.6–1.1)
EGFR: 28 mL/min/{1.73_m2} — ABNORMAL LOW (ref >60)
Glucose: 147 mg/dl — ABNORMAL HIGH (ref 70–140)
Potassium: 4 mEq/L (ref 3.5–5.1)
Sodium: 145 mEq/L (ref 136–145)
Total Bilirubin: 0.61 mg/dL (ref 0.20–1.20)
Total Protein: 7.4 g/dL (ref 6.4–8.3)

## 2017-04-24 ENCOUNTER — Other Ambulatory Visit: Payer: Self-pay | Admitting: Internal Medicine

## 2017-04-24 MED FILL — AMLODIPINE BESYLATE 5 MG TA: 5 | 90 days supply | Qty: 90 | Fill #2

## 2017-04-24 MED FILL — FUROSEMIDE 40 MG TABS: 40 | 90 days supply | Qty: 90 | Fill #0

## 2017-04-24 MED FILL — ALLOPURINOL 100 MG TABS: 100 | 90 days supply | Qty: 135 | Fill #0

## 2017-04-24 MED FILL — LISINOPRIL 20 MG TAB: 20 | 90 days supply | Qty: 180 | Fill #2

## 2017-05-23 ENCOUNTER — Ambulatory Visit: Payer: Medicare Other | Admitting: Internal Medicine

## 2017-06-19 ENCOUNTER — Other Ambulatory Visit (INDEPENDENT_AMBULATORY_CARE_PROVIDER_SITE_OTHER): Payer: Medicare Other

## 2017-06-19 ENCOUNTER — Ambulatory Visit (INDEPENDENT_AMBULATORY_CARE_PROVIDER_SITE_OTHER): Payer: Medicare Other | Admitting: Internal Medicine

## 2017-06-19 ENCOUNTER — Encounter: Payer: Self-pay | Admitting: Internal Medicine

## 2017-06-19 ENCOUNTER — Other Ambulatory Visit: Payer: Self-pay | Admitting: Internal Medicine

## 2017-06-19 ENCOUNTER — Ambulatory Visit: Payer: Medicare Other

## 2017-06-19 VITALS — BP 142/86 | HR 90 | Temp 97.6°F | Ht 65.0 in | Wt 219.0 lb

## 2017-06-19 DIAGNOSIS — N183 Chronic kidney disease, stage 3 unspecified: Secondary | ICD-10-CM

## 2017-06-19 DIAGNOSIS — E119 Type 2 diabetes mellitus without complications: Secondary | ICD-10-CM | POA: Diagnosis not present

## 2017-06-19 DIAGNOSIS — R7989 Other specified abnormal findings of blood chemistry: Secondary | ICD-10-CM

## 2017-06-19 DIAGNOSIS — Z0000 Encounter for general adult medical examination without abnormal findings: Secondary | ICD-10-CM

## 2017-06-19 DIAGNOSIS — Z23 Encounter for immunization: Secondary | ICD-10-CM | POA: Diagnosis not present

## 2017-06-19 DIAGNOSIS — I10 Essential (primary) hypertension: Secondary | ICD-10-CM

## 2017-06-19 LAB — BASIC METABOLIC PANEL
BUN: 52 mg/dL — ABNORMAL HIGH (ref 6–23)
CO2: 29 mEq/L (ref 19–32)
Calcium: 9.9 mg/dL (ref 8.4–10.5)
Chloride: 101 mEq/L (ref 96–112)
Creatinine, Ser: 1.66 mg/dL — ABNORMAL HIGH (ref 0.40–1.20)
GFR: 31.18 mL/min — ABNORMAL LOW (ref >60.00)
Glucose, Bld: 123 mg/dL — ABNORMAL HIGH (ref 70–99)
Potassium: 3.9 mEq/L (ref 3.5–5.1)
Sodium: 140 mEq/L (ref 135–145)

## 2017-06-19 LAB — URINALYSIS, ROUTINE W REFLEX MICROSCOPIC
Bilirubin Urine: NEGATIVE
Ketones, ur: NEGATIVE
Nitrite: POSITIVE — AB
Specific Gravity, Urine: 1.02 (ref 1.000–1.030)
Total Protein, Urine: NEGATIVE
Urine Glucose: NEGATIVE
Urobilinogen, UA: 0.2 (ref 0.0–1.0)
pH: 5.5 (ref 5.0–8.0)

## 2017-06-19 LAB — HEPATIC FUNCTION PANEL
ALT: 13 U/L (ref 0–35)
AST: 20 U/L (ref 0–37)
Albumin: 4 g/dL (ref 3.5–5.2)
Alkaline Phosphatase: 94 U/L (ref 39–117)
Bilirubin, Direct: 0.1 mg/dL (ref 0.0–0.3)
Total Bilirubin: 0.6 mg/dL (ref 0.2–1.2)
Total Protein: 7.6 g/dL (ref 6.0–8.3)

## 2017-06-19 LAB — MICROALBUMIN / CREATININE URINE RATIO
Creatinine,U: 54.5 mg/dL
Microalb Creat Ratio: 5.8 mg/g (ref 0.0–30.0)
Microalb, Ur: 3.2 mg/dL — ABNORMAL HIGH (ref 0.0–1.9)

## 2017-06-19 LAB — CBC WITH DIFFERENTIAL/PLATELET
Basophils Absolute: 0.1 10*3/uL (ref 0.0–0.1)
Basophils Relative: 0.8 % (ref 0.0–3.0)
Eosinophils Absolute: 0.3 10*3/uL (ref 0.0–0.7)
Eosinophils Relative: 2.4 % (ref 0.0–5.0)
HCT: 40.4 % (ref 36.0–46.0)
Hemoglobin: 12.7 g/dL (ref 12.0–15.0)
Lymphocytes Relative: 22.4 % (ref 12.0–46.0)
Lymphs Abs: 2.5 10*3/uL (ref 0.7–4.0)
MCHC: 31.6 g/dL (ref 30.0–36.0)
MCV: 94.3 fl (ref 78.0–100.0)
Monocytes Absolute: 0.9 10*3/uL (ref 0.1–1.0)
Monocytes Relative: 7.8 % (ref 3.0–12.0)
Neutro Abs: 7.3 10*3/uL (ref 1.4–7.7)
Neutrophils Relative %: 66.6 % (ref 43.0–77.0)
Platelets: 243 10*3/uL (ref 150.0–400.0)
RBC: 4.28 Mil/uL (ref 3.87–5.11)
RDW: 16.6 % — ABNORMAL HIGH (ref 11.5–15.5)
WBC: 11 10*3/uL — ABNORMAL HIGH (ref 4.0–10.5)

## 2017-06-19 LAB — LIPID PANEL
Cholesterol: 207 mg/dL — ABNORMAL HIGH (ref 0–200)
HDL: 33.7 mg/dL — ABNORMAL LOW (ref >39.00)
NonHDL: 172.97
Total CHOL/HDL Ratio: 6
Triglycerides: 238 mg/dL — ABNORMAL HIGH (ref 0.0–149.0)
VLDL: 47.6 mg/dL — ABNORMAL HIGH (ref 0.0–40.0)

## 2017-06-19 LAB — TSH: TSH: 6.18 u[IU]/mL — ABNORMAL HIGH (ref 0.35–4.50)

## 2017-06-19 LAB — HEMOGLOBIN A1C: Hgb A1c MFr Bld: 7 % — ABNORMAL HIGH (ref 4.6–6.5)

## 2017-06-19 LAB — LDL CHOLESTEROL, DIRECT: Direct LDL: 150 mg/dL

## 2017-06-19 MED ORDER — LEVOTHYROXINE SODIUM 25 MCG PO TABS: 25.0000 ug | ORAL_TABLET | Freq: Every day | ORAL | 3 refills | Status: DC

## 2017-06-19 MED ORDER — CEPHALEXIN 500 MG PO CAPS: 500.0000 mg | ORAL_CAPSULE | Freq: Three times a day (TID) | ORAL | 0 refills | Status: DC

## 2017-06-19 NOTE — Assessment & Plan Note (Signed)
Also for f/u TFTs, tx if abnormal

## 2017-06-19 NOTE — Patient Instructions (Addendum)
You had the flu shot today  You are OK to take Tylenol 650 mg (tylenol extra strength) - 1 pill every 8 hours as needed for pain  Please continue all other medications as before, and refills have been done if requested.  Please have the pharmacy call with any other refills you may need.  Please continue your efforts at being more active, low cholesterol diet, and weight control.  You are otherwise up to date with prevention measures today.  Please keep your appointments with your specialists as you may have planned  You will be contacted regarding the referral for: Kidney doctors (nephrology)  Please go to the LAB in the Basement (turn left off the elevator) for the tests to be done today  You will be contacted by phone if any changes need to be made immediately.  Otherwise, you will receive a letter about your results with an explanation, but please check with MyChart first.  Please remember to sign up for MyChart if you have not done so, as this will be important to you in the future with finding out test results, communicating by private email, and scheduling acute appointments online when needed.  Please return in 6 months, or sooner if needed, with Lab testing done 3-5 days before

## 2017-06-19 NOTE — Assessment & Plan Note (Signed)
Some improved but still mild elevated, declines increased antiHTN, o/w stable overall by history and exam, recent data reviewed with pt, and pt to continue medical treatment as before,  to f/u any worsening symptoms or concerns BP Readings from Last 3 Encounters:  06/19/17 (!) 142/86  04/11/17 (!) 158/56  11/15/16 (!) 164/94

## 2017-06-19 NOTE — Assessment & Plan Note (Signed)
stable overall by history and exam, recent data reviewed with pt, and pt to continue medical treatment as before,  to f/u any worsening symptoms or concerns Lab Results  Component Value Date   HGBA1C 7.0 (H) 06/19/2017

## 2017-06-19 NOTE — Progress Notes (Deleted)
Subjective:   [name] is a 84 y.o. female who presents for an Initial Medicare Annual Wellness Visit.  Review of Systems    No ROS.  Medicare Wellness Visit. Additional risk factors are reflected in the social history.     Sleep patterns: {SX; SLEEP PATTERNS:18802::" Home Safety/Smoke Alarms: Feels safe in home. Smoke alarms in place.  Living environment; residence and Firearm Safety: . [place] Safety/Bike Helmet: Wears seat belt.    Objective:    There were no vitals filed for this visit. There is no height or weight on file to calculate BMI.  Advanced Directives 04/11/2016 06/18/2014 06/18/2014 10/10/2013 08/12/2013 12/19/2011 06/29/2011  Does Patient Have a Medical Advance Directive? Yes No No Patient does not have advance directive;Patient would not like information Patient does not have advance directive Patient does not have advance directive Patient does not have advance directive;Patient would not like information  Type of Advance Directive Living will - - - - - -  Would patient like information on creating a medical advance directive? - No - patient declined information No - patient declined information - - - -  Pre-existing out of facility DNR order (yellow form or pink MOST form) - - - - No - -    Current Medications (verified) Outpatient Encounter Medications as of 06/19/2017  Medication Sig  . allopurinol (ZYLOPRIM) 100 MG tablet TAKE 2 TABLETS BY MOUTH EVERY OTHER DAY AND 1 TABLET EVERY OTHER DAY  . amLODipine (NORVASC) 5 MG tablet Take 1 tablet (5 mg total) by mouth daily.  Marland Kitchen anastrozole (ARIMIDEX) 1 MG tablet Take 1 tablet (1 mg total) by mouth daily.  Marland Kitchen aspirin 325 MG EC tablet Take 1 tablet (325 mg total) by mouth every other day.  . cholecalciferol (VITAMIN D) 1000 UNITS tablet Take 1,000 Units by mouth every morning.   . ezetimibe (ZETIA) 10 MG tablet Take 1 tablet (10 mg total) by mouth daily.  . furosemide (LASIX) 40 MG tablet TAKE 1 TABLET BY MOUTH EVERY  MORNING.  Marland Kitchen lisinopril (PRINIVIL,ZESTRIL) 20 MG tablet Take 1 tablet (20 mg total) by mouth 2 (two) times daily.  . metFORMIN (GLUCOPHAGE-XR) 500 MG 24 hr tablet TAKE 1 TABLET BY MOUTH ONCE DAILY WITH BREAKFAST  . nitrofurantoin (MACRODANTIN) 50 MG capsule Take 1 capsule (50 mg total) by mouth 2 (two) times daily.  . pantoprazole (PROTONIX) 40 MG tablet Take 1 tablet (40 mg total) by mouth daily.  . traMADol-acetaminophen (ULTRACET) 37.5-325 MG tablet TAKE ONE TABLET BY MOUTH EVERY 6 HOURS AS NEEDED   No facility-administered encounter medications on file as of 06/19/2017.     Allergies (verified) Ace inhibitors; Atorvastatin; Oxycodone-acetaminophen; Rosuvastatin; Simvastatin; Codeine; and Sulfonamide derivatives   History: Past Medical History:  Diagnosis Date  . Anemia   . Anxiety   . Atrial flutter ([place])    "sometimes"  . Blood transfusion   . Breast cancer ([place])    right breast  . Bronchitis   . Chronic kidney disease    occ uti's  . CKD (chronic kidney disease) stage 3, GFR 30-59 ml/min   . Colon polyps    hyperplastic  . CVA (cerebral infarction)   . Diverticulosis   . DVT of leg (deep venous thrombosis) ([name])    left; S/P OR  . Dysrhythmia    dr bensimhon   . Esophageal stricture   . GERD (gastroesophageal reflux disease)   . Gout   . Hiatal hernia    4 cm  .  History of radiation therapy 02/15/12-04/02/12   right breast 4680 cGy/26 sessions, right boost=1400cGy /7 sessions  . Hyperlipidemia   . Hypertension   . IBS (irritable bowel syndrome)   . Kidney stones 1990's  . Morbid obesity ([place])   . Osteoarthritis   . Pneumonia    "walking"  . PONV (postoperative nausea and vomiting)   . Shingles 02/21/2011  . Shortness of breath on exertion   . Skin cancer   . Stroke [facility]) 2002   residual:  "little tingling in palm of left hand"  . UTI (lower urinary tract infection)    "I've had a few"   Past Surgical History:  Procedure Laterality Date  . ABDOMINAL  HYSTERECTOMY    . BILE DUCT STENT PLACEMENT  ~ 01/2011  . BREAST LUMPECTOMY  06/29/11   right  . BREAST LUMPECTOMY  06/29/2011   Procedure: BREAST LUMPECTOMY WITH EXCISION OF SENTINEL NODE;  Surgeon: Pedro Earls, MD;  Location: [place];  Service: General;  Laterality: Right;  right sentinel node mapping,right sentinel node biopsy, needle localization right breast lumpectomy  . CATARACT EXTRACTION W/ INTRAOCULAR LENS  IMPLANT, BILATERAL  1990's  . CHOLECYSTECTOMY  03/23/11   lap chole   . COLON SURGERY     hole in intestine ,tumors?  . colonic perforation repair  ~ 2000  . [place]  2012  . INCISION AND DRAINAGE ABSCESS N/A 08/12/2013   Procedure: INCISION AND DRAINAGE ABSCESS;  Surgeon: Earnstine Regal, MD;  [facility];  Service: General;  Laterality: N/A;  . INCISION AND DRAINAGE ABSCESS N/A 08/20/2013   Procedure: INCISION AND DRAINAGE ABSCESS;  Surgeon: Ralene Ok, MD;  [facility];  Service: General;  Laterality: N/A;  . INCISION AND DRAINAGE BREAST ABSCESS     right breast seroma  . IRRIGATION AND DEBRIDEMENT ABSCESS N/A 08/14/2013   Procedure: Debridment of perineal tissue and debridement of perineal wound;  Surgeon: Earnstine Regal, MD;  [facility];  Service: General;  Laterality: N/A;  . IRRIGATION AND DEBRIDEMENT ABSCESS N/A 08/16/2013   Procedure: DRESSING CHANGE AND DEBRIDEMENT OF PERINEUM WOUND;  Surgeon: Earnstine Regal, MD;  [facility];  Service: General;  Laterality: N/A;  . JOINT REPLACEMENT    . MINOR APPLICATION OF WOUND VAC N/A 08/20/2013   Procedure: MINOR APPLICATION OF WOUND VAC;  Surgeon: Ralene Ok, MD;  [facility];  Service: General;  Laterality: N/A;  . pelvic bone tumor removal     twice in 2000's  . PILONIDAL CYST DRAINAGE N/A 08/19/2013   Procedure: Dressing change perineum;  Surgeon: Ralene Ok, MD;  [facility];  Service: General;  Laterality: N/A;  . REPLACEMENT TOTAL KNEE BILATERAL  ~ 2008; ~ 2010   right; left   . TONSILLECTOMY  ~ 1946  . TOTAL HIP ARTHROPLASTY  1980's   right   Family History  Problem Relation Age of Onset  . Breast cancer Sister        x 2  . Diabetes Sister   . Prostate cancer Brother   . Colon cancer Neg Hx   . Stroke Mother   . Kidney failure Sister   . Heart disease Paternal Uncle        multiple  . Heart disease Maternal Uncle        multiple   Social History   Socioeconomic History  . Marital status: Married    Spouse name: Not on file  . Number of children: Not on  file  . Years of education: Not on file  . Highest education level: Not on file  Social Needs  . Financial resource strain: Not on file  . Food insecurity - worry: Not on file  . Food insecurity - inability: Not on file  . Transportation needs - medical: Not on file  . Transportation needs - non-medical: Not on file  Occupational History  . Occupation: hairdresser  Tobacco Use  . Smoking status: Never Smoker  . Smokeless tobacco: Never Used  Substance and Sexual Activity  . Alcohol use: No  . Drug use: No  . Sexual activity: No  Other Topics Concern  . Not on file  Social History Narrative   Lives at home with her husband.  Uses a walker for ambulation    Tobacco Counseling Counseling given: Not Answered  Activities of Daily Living No flowsheet data found.   Immunizations and Health Maintenance Immunization History  Administered Date(s) Administered  . Influenza Split 04/17/2012  . Influenza,inj,Quad PF,6+ Mos 04/20/2016  . Pneumococcal Polysaccharide-23 06/17/2009  . Td 06/17/2009   Health Maintenance Due  Topic Date Due  . FOOT EXAM  02/09/1940  . OPHTHALMOLOGY EXAM  02/09/1940  . PNA vac Low Risk Adult (2 of 2 - PCV13) 06/17/2010  . INFLUENZA VACCINE  01/11/2017    Patient Care Team: Biagio Borg, MD as PCP - General Magrinat, Virgie Dad, MD as Consulting Physician (Oncology)  Indicate any recent Medical Services you may have received from other than [facility]  providers in the past year (date may be approximate).     Assessment:   This is a routine wellness examination for Sherene. Physical assessment deferred to PCP.   Hearing/Vision screen No exam data present  Dietary issues and exercise activities discussed:   Diet (meal preparation, eat out, water intake, caffeinated beverages, dairy products, fruits and vegetables):    Goals    None     Depression Screen PHQ 2/9 Scores 04/20/2016  PHQ - 2 Score 0    Fall Risk Fall Risk  04/20/2016  Falls in the past year? No    Cognitive Function:        Screening Tests Health Maintenance  Topic Date Due  . FOOT EXAM  [birth year]09/1940  . OPHTHALMOLOGY EXAM  02[birth year]09/1940  . PNA vac Low Risk Adult (2 of 2 - PCV13) 06/17/2010  . INFLUENZA VACCINE  01/11/2017  . TETANUS/TDAP  06/18/2019  . DEXA SCAN  Completed     Plan:      I have personally reviewed and noted the following in the patient's chart:   . Medical and social history . Use of alcohol, tobacco or illicit drugs  . Current medications and supplements . Functional ability and status . Nutritional status . Physical activity . Advanced directives . List of other physicians . Vitals . Screenings to include cognitive, depression, and falls . Referrals and appointments  In addition, I have reviewed and discussed with patient certain preventive protocols, quality metrics, and best practice recommendations. A written personalized care plan for preventive services as well as general preventive health recommendations were provided to patient.     Michiel Cowboy, RN   06/19/2017    Medical screening examination/treatment/procedure(s) were performed by non-physician practitioner and as supervising physician I was immediately available for consultation/collaboration. I agree with above. [name], MD

## 2017-06-19 NOTE — Assessment & Plan Note (Signed)

## 2017-06-19 NOTE — Assessment & Plan Note (Addendum)
With mild worsening, will refer Renal, ok for tylenol ES 1 every 8 prn for pain

## 2017-06-19 NOTE — Progress Notes (Signed)
Subjective:    Patient ID: [name], female    DOB: 03/13/1930, 84 y.o.   MRN: [id number]  HPI  Here for wellness and f/u;  Overall doing ok;  Pt denies Chest pain, worsening SOB, DOE, wheezing, orthopnea, PND, worsening LE edema, palpitations, dizziness or syncope.  Pt denies neurological change such as new headache, facial or extremity weakness.  Pt denies polydipsia, polyuria, or low sugar symptoms. Pt states overall good compliance with treatment and medications, good tolerability, and has been trying to follow appropriate diet.  Pt denies worsening depressive symptoms, suicidal ideation or panic. No fever, night sweats, wt loss, loss of appetite, or other constitutional symptoms.  Pt states good ability with ADL's, has low fall risk, home safety reviewed and adequate, no other significant changes in hearing or vision, and only occasionally active with exercise, due to her chronic for which she no longer takes nsaid due to ckd. Past Medical History:  Diagnosis Date  . Anemia   . Anxiety   . Atrial flutter ([place])    "sometimes"  . Blood transfusion   . Breast cancer ([place])    right breast  . Bronchitis   . Chronic kidney disease    occ uti's  . CKD (chronic kidney disease) stage 3, GFR 30-59 ml/min ([name])   . Colon polyps    hyperplastic  . CVA (cerebral infarction)   . Diverticulosis   . DVT of leg (deep venous thrombosis) ([name])    left; S/P OR  . Dysrhythmia    dr bensimhon   . Esophageal stricture   . GERD (gastroesophageal reflux disease)   . Gout   . Hiatal hernia    4 cm  . History of radiation therapy 02/15/12-04/02/12   right breast 4680 cGy/26 sessions, right boost=1400cGy /7 sessions  . Hyperlipidemia   . Hypertension   . IBS (irritable bowel syndrome)   . Kidney stones 1990's  . Morbid obesity ([place])   . Osteoarthritis   . Pneumonia    "walking"  . PONV (postoperative nausea and vomiting)   . Shingles 02/21/2011  . Shortness of breath on exertion   . Skin  cancer   . Stroke [facility]) 2002   residual:  "little tingling in palm of left hand"  . UTI (lower urinary tract infection)    "I've had a few"   Past Surgical History:  Procedure Laterality Date  . ABDOMINAL HYSTERECTOMY    . BILE DUCT STENT PLACEMENT  ~ 01/2011  . BREAST LUMPECTOMY  06/29/11   right  . BREAST LUMPECTOMY  06/29/2011   Procedure: BREAST LUMPECTOMY WITH EXCISION OF SENTINEL NODE;  Surgeon: Pedro Earls, MD;  Location: [place];  Service: General;  Laterality: Right;  right sentinel node mapping,right sentinel node biopsy, needle localization right breast lumpectomy  . CATARACT EXTRACTION W/ INTRAOCULAR LENS  IMPLANT, BILATERAL  1990's  . CHOLECYSTECTOMY  03/23/11   lap chole   . COLON SURGERY     hole in intestine ,tumors?  . colonic perforation repair  ~ 2000  . [place]  2012  . INCISION AND DRAINAGE ABSCESS N/A 08/12/2013   Procedure: INCISION AND DRAINAGE ABSCESS;  Surgeon: Earnstine Regal, MD;  [facility];  Service: General;  Laterality: N/A;  . INCISION AND DRAINAGE ABSCESS N/A 08/20/2013   Procedure: INCISION AND DRAINAGE ABSCESS;  Surgeon: Ralene Ok, MD;  [facility];  Service: General;  Laterality: N/A;  . INCISION AND DRAINAGE BREAST ABSCESS  right breast seroma  . IRRIGATION AND DEBRIDEMENT ABSCESS N/A 08/14/2013   Procedure: Debridment of perineal tissue and debridement of perineal wound;  Surgeon: Earnstine Regal, MD;  [facility];  Service: General;  Laterality: N/A;  . IRRIGATION AND DEBRIDEMENT ABSCESS N/A 08/16/2013   Procedure: DRESSING CHANGE AND DEBRIDEMENT OF PERINEUM WOUND;  Surgeon: Earnstine Regal, MD;  [facility];  Service: General;  Laterality: N/A;  . JOINT REPLACEMENT    . MINOR APPLICATION OF WOUND VAC N/A 08/20/2013   Procedure: MINOR APPLICATION OF WOUND VAC;  Surgeon: Ralene Ok, MD;  [facility];  Service: General;  Laterality: N/A;  . pelvic bone tumor removal     twice in 2000's  . PILONIDAL CYST  DRAINAGE N/A 08/19/2013   Procedure: Dressing change perineum;  Surgeon: Ralene Ok, MD;  [facility];  Service: General;  Laterality: N/A;  . REPLACEMENT TOTAL KNEE BILATERAL  ~ 2008; ~ 2010   right; left  . TONSILLECTOMY  ~ 1946  . TOTAL HIP ARTHROPLASTY  1980's   right    reports that  has never smoked. she has never used smokeless tobacco. She reports that she does not drink alcohol or use drugs. family history includes Breast cancer in her sister; Diabetes in her sister; Heart disease in her maternal uncle and paternal uncle; Kidney failure in her sister; Prostate cancer in her brother; Stroke in her mother. Allergies  Allergen Reactions  . Ace Inhibitors Other (See Comments)    Tolerates lisinopril at home. Pt does not recall allergy.  . Atorvastatin     REACTION: myalgias  . Oxycodone-Acetaminophen     REACTION: confusion, fatigue  . Rosuvastatin     REACTION: leg weakness  . Simvastatin     REACTION: leg weakness  . Codeine Nausea And Vomiting and Rash  . Sulfonamide Derivatives Hives and Rash   Current Outpatient Medications on File Prior to Visit  Medication Sig Dispense Refill  . allopurinol (ZYLOPRIM) 100 MG tablet TAKE 2 TABLETS BY MOUTH EVERY OTHER DAY AND 1 TABLET EVERY OTHER DAY 135 tablet 0  . amLODipine (NORVASC) 5 MG tablet Take 1 tablet (5 mg total) by mouth daily. 90 tablet 3  . anastrozole (ARIMIDEX) 1 MG tablet Take 1 tablet (1 mg total) by mouth daily. 90 tablet 4  . aspirin 325 MG EC tablet Take 1 tablet (325 mg total) by mouth every other day.    . cholecalciferol (VITAMIN D) 1000 UNITS tablet Take 1,000 Units by mouth every morning.     . ezetimibe (ZETIA) 10 MG tablet Take 1 tablet (10 mg total) by mouth daily. 90 tablet 3  . furosemide (LASIX) 40 MG tablet TAKE 1 TABLET BY MOUTH EVERY MORNING. 90 tablet 0  . lisinopril (PRINIVIL,ZESTRIL) 20 MG tablet Take 1 tablet (20 mg total) by mouth 2 (two) times daily. 180 tablet 2  . metFORMIN  (GLUCOPHAGE-XR) 500 MG 24 hr tablet TAKE 1 TABLET BY MOUTH ONCE DAILY WITH BREAKFAST 90 tablet 0  . nitrofurantoin (MACRODANTIN) 50 MG capsule Take 1 capsule (50 mg total) by mouth 2 (two) times daily. 20 capsule 0  . pantoprazole (PROTONIX) 40 MG tablet Take 1 tablet (40 mg total) by mouth daily. 30 tablet 0  . traMADol-acetaminophen (ULTRACET) 37.5-325 MG tablet TAKE ONE TABLET BY MOUTH EVERY 6 HOURS AS NEEDED 120 tablet 2   No current facility-administered medications on file prior to visit.    Review of Systems Constitutional: Negative for other unusual  diaphoresis, sweats, appetite or weight changes HENT: Negative for other worsening hearing loss, ear pain, facial swelling, mouth sores or neck stiffness.   Eyes: Negative for other worsening pain, redness or other visual disturbance.  Respiratory: Negative for other stridor or swelling Cardiovascular: Negative for other palpitations or other chest pain  Gastrointestinal: Negative for worsening diarrhea or loose stools, blood in stool, distention or other pain Genitourinary: Negative for hematuria, flank pain or other change in urine volume.  Musculoskeletal: Negative for myalgias or other joint swelling.  Skin: Negative for other color change, or other wound or worsening drainage.  Neurological: Negative for other syncope or numbness. Hematological: Negative for other adenopathy or swelling Psychiatric/Behavioral: Negative for hallucinations, other worsening agitation, SI, self-injury, or new decreased concentration All other system neg perp t    Objective:   Physical Exam BP (!) 142/86   Pulse 90   Temp 97.6 F (36.4 C) (Oral)   Ht 5\' 5"  (1.651 m)   Wt 219 lb (99.3 kg)   SpO2 98%   BMI 36.44 kg/m  VS noted,  Constitutional: Pt is oriented to person, place, and time. Appears well-developed and well-nourished, in no significant distress and comfortable Head: Normocephalic and atraumatic  Eyes: Conjunctivae and EOM are normal.  Pupils are equal, round, and reactive to light Right Ear: External ear normal without discharge Left Ear: External ear normal without discharge Nose: Nose without discharge or deformity Mouth/Throat: Oropharynx is without other ulcerations and moist  Neck: Normal range of motion. Neck supple. No JVD present. No tracheal deviation present or significant neck LA or mass Cardiovascular: Normal rate, regular rhythm, normal heart sounds and intact distal pulses.   Pulmonary/Chest: WOB normal and breath sounds without rales or wheezing  Abdominal: Soft. Bowel sounds are normal. NT. No HSM  Musculoskeletal: Normal range of motion. Lymphadenopathy: Has no other cervical adenopathy.  Neurological: Pt is alert and oriented to person, place, and time. Pt has normal reflexes. No cranial nerve deficit. Motor grossly intact, Gait intact Skin: Skin is warm and dry. No rash noted or new ulcerations Psychiatric:  Has normal mood and affect. Behavior is normal without agitation Trace ankle edema No other exam findings     Assessment & Plan:

## 2017-06-20 ENCOUNTER — Telehealth: Payer: Self-pay

## 2017-06-20 MED ORDER — LEVOTHYROXINE SODIUM 25 MCG PO TABS: 25.0000 ug | ORAL_TABLET | Freq: Every day | ORAL | 3 refills | Status: DC

## 2017-06-20 MED ORDER — CEPHALEXIN 500 MG PO CAPS: 500.0000 mg | ORAL_CAPSULE | Freq: Three times a day (TID) | ORAL | 0 refills | Status: AC

## 2017-06-20 MED FILL — LEVOTHYROXINE 25 MCG TABLET: 25 | 90 days supply | Qty: 90 | Fill #0

## 2017-06-20 MED FILL — CEPHALEXIN 500 MG CAPSULE: 500 | 7 days supply | Qty: 21 | Fill #0

## 2017-06-20 NOTE — Telephone Encounter (Signed)
Pt has been informed and expressed understanding.  

## 2017-06-20 NOTE — Telephone Encounter (Signed)
-----   Message from Biagio Borg, MD sent at 06/19/2017  7:22 PM EST ----- Left message on MyChart, pt to cont same tx except  The test results show that your current treatment is OK, except the LDL cholesterol is moderately high, the Kidney function is low but stable, the thyroid test is consistent with new very mild low thyroid, and the urine testing shows a possible urinary tract infection.  We should start a low dose thyroid medication, as well as an antibiotic for the infection.  I will send the prescriptions, and you should hear from the office.  Please also follow a lower cholesterol diet, and you have been referred to the medical kidney doctor at your visit today.    Ensley Blas to please inform pt, I will do rx for thyroid (to take long term) and antibiotic (for 7 days)

## 2017-06-27 ENCOUNTER — Ambulatory Visit: Payer: Medicare Other

## 2017-06-29 ENCOUNTER — Other Ambulatory Visit: Payer: Self-pay | Admitting: Surgery

## 2017-06-29 DIAGNOSIS — Z853 Personal history of malignant neoplasm of breast: Secondary | ICD-10-CM

## 2017-07-20 ENCOUNTER — Other Ambulatory Visit: Payer: Self-pay | Admitting: Internal Medicine

## 2017-07-20 MED FILL — LISINOPRIL 20 MG TABLET: 20 | 90 days supply | Qty: 180 | Fill #0

## 2017-07-20 MED FILL — METFORMIN HCL ER 500 MG TAB: 500 | 90 days supply | Qty: 90 | Fill #0

## 2017-07-20 MED FILL — FUROSEMIDE 40 MG TAB: 40 | 90 days supply | Qty: 90 | Fill #0

## 2017-07-20 MED FILL — ALLOPURINOL 100 MG TABS: 100 | 90 days supply | Qty: 135 | Fill #0

## 2017-07-20 MED FILL — AMLODIPINE BESYLATE 5 MG TA: 5 | 90 days supply | Qty: 90 | Fill #3

## 2017-08-14 ENCOUNTER — Ambulatory Visit
Admission: RE | Admit: 2017-08-14 | Discharge: 2017-08-14 | Disposition: A | Discharge status: Home / Self Care | Payer: Medicare Other | Source: Ambulatory Visit | Attending: Surgery | Admitting: Surgery

## 2017-08-14 DIAGNOSIS — Z853 Personal history of malignant neoplasm of breast: Secondary | ICD-10-CM

## 2017-08-14 DIAGNOSIS — R928 Other abnormal and inconclusive findings on diagnostic imaging of breast: Secondary | ICD-10-CM | POA: Diagnosis not present

## 2017-08-14 HISTORY — DX: Personal history of irradiation: Z92.3

## 2017-10-09 DIAGNOSIS — I129 Hypertensive chronic kidney disease with stage 1 through stage 4 chronic kidney disease, or unspecified chronic kidney disease: Secondary | ICD-10-CM | POA: Diagnosis not present

## 2017-10-09 DIAGNOSIS — N183 Chronic kidney disease, stage 3 (moderate): Secondary | ICD-10-CM | POA: Diagnosis not present

## 2017-10-09 DIAGNOSIS — N184 Chronic kidney disease, stage 4 (severe): Secondary | ICD-10-CM | POA: Diagnosis not present

## 2017-10-09 DIAGNOSIS — N390 Urinary tract infection, site not specified: Secondary | ICD-10-CM | POA: Diagnosis not present

## 2017-10-10 ENCOUNTER — Telehealth: Payer: Self-pay | Admitting: Internal Medicine

## 2017-10-10 NOTE — Telephone Encounter (Signed)
Pt has been informed and expressed understanding.  

## 2017-10-10 NOTE — Telephone Encounter (Signed)
Copied from [place] [id number]. Topic: Quick Communication - See Telephone Encounter >> Oct 10, 2017 11:56 AM Cleaster Corin, NT wrote: CRM for notification. See Telephone encounter for: 10/10/17.  Pt. Calling to let Dr. [name] know that the kidney specialist would like her to stop the metFORMIN (GLUCOPHAGE-XR) 500 MG 24 hr tablet [[id number]] immediately.

## 2017-10-10 NOTE — Telephone Encounter (Signed)
Ok to stop the metformin  We will hold on starting of new medication for now, until the next a1c I done  Shirron to please inform pt

## 2017-10-12 ENCOUNTER — Other Ambulatory Visit: Payer: Self-pay | Admitting: Nephrology

## 2017-10-12 DIAGNOSIS — I129 Hypertensive chronic kidney disease with stage 1 through stage 4 chronic kidney disease, or unspecified chronic kidney disease: Secondary | ICD-10-CM

## 2017-10-12 DIAGNOSIS — N184 Chronic kidney disease, stage 4 (severe): Secondary | ICD-10-CM

## 2017-10-17 MED FILL — AMOXICILLIN-POT CLAVULANATE: 250-125 | 5 days supply | Qty: 10 | Fill #0

## 2017-10-19 ENCOUNTER — Ambulatory Visit
Admission: RE | Admit: 2017-10-19 | Discharge: 2017-10-19 | Disposition: A | Discharge status: Home / Self Care | Payer: Medicare Other | Source: Ambulatory Visit | Attending: Nephrology | Admitting: Nephrology

## 2017-10-19 DIAGNOSIS — N184 Chronic kidney disease, stage 4 (severe): Secondary | ICD-10-CM

## 2017-10-19 DIAGNOSIS — N189 Chronic kidney disease, unspecified: Secondary | ICD-10-CM | POA: Diagnosis not present

## 2017-10-19 DIAGNOSIS — I129 Hypertensive chronic kidney disease with stage 1 through stage 4 chronic kidney disease, or unspecified chronic kidney disease: Secondary | ICD-10-CM

## 2017-10-23 ENCOUNTER — Other Ambulatory Visit: Payer: Self-pay | Admitting: Internal Medicine

## 2017-10-23 MED FILL — ALLOPURINOL 100 MG TABLET: 100 | 90 days supply | Qty: 135 | Fill #1

## 2017-10-23 MED FILL — FUROSEMIDE 40 MG TAB: 40 | 90 days supply | Qty: 90 | Fill #1

## 2017-10-23 MED FILL — AMLODIPINE BESYLATE 5 MG TA: 5 | 90 days supply | Qty: 90 | Fill #0

## 2017-10-23 MED FILL — LISINOPRIL 20 MG TABLET: 20 | 90 days supply | Qty: 180 | Fill #1

## 2017-10-31 ENCOUNTER — Ambulatory Visit (INDEPENDENT_AMBULATORY_CARE_PROVIDER_SITE_OTHER): Payer: Medicare Other | Admitting: Internal Medicine

## 2017-10-31 ENCOUNTER — Encounter: Payer: Self-pay | Admitting: Internal Medicine

## 2017-10-31 ENCOUNTER — Telehealth: Payer: Self-pay

## 2017-10-31 ENCOUNTER — Ambulatory Visit (INDEPENDENT_AMBULATORY_CARE_PROVIDER_SITE_OTHER)
Admission: RE | Admit: 2017-10-31 | Discharge: 2017-10-31 | Disposition: A | Discharge status: Home / Self Care | Payer: Medicare Other | Source: Ambulatory Visit | Attending: Internal Medicine | Admitting: Internal Medicine

## 2017-10-31 VITALS — BP 134/82 | HR 85 | Temp 98.2°F | Ht 65.0 in | Wt 222.0 lb

## 2017-10-31 DIAGNOSIS — R05 Cough: Secondary | ICD-10-CM

## 2017-10-31 DIAGNOSIS — R7989 Other specified abnormal findings of blood chemistry: Secondary | ICD-10-CM

## 2017-10-31 DIAGNOSIS — R062 Wheezing: Secondary | ICD-10-CM | POA: Diagnosis not present

## 2017-10-31 DIAGNOSIS — E119 Type 2 diabetes mellitus without complications: Secondary | ICD-10-CM | POA: Diagnosis not present

## 2017-10-31 DIAGNOSIS — N183 Chronic kidney disease, stage 3 unspecified: Secondary | ICD-10-CM

## 2017-10-31 DIAGNOSIS — R059 Cough, unspecified: Secondary | ICD-10-CM

## 2017-10-31 MED ORDER — PREDNISONE 10 MG PO TABS
ORAL_TABLET | ORAL | 0 refills | Status: DC
Start: 2017-10-31 — End: 2019-05-29

## 2017-10-31 MED ORDER — PREDNISONE 10 MG PO TABS
ORAL_TABLET | ORAL | 0 refills | Status: DC
Start: 2017-10-31 — End: 2017-10-31

## 2017-10-31 MED ORDER — AZITHROMYCIN 250 MG PO TABS: ORAL_TABLET | ORAL | 1 refills | Status: DC

## 2017-10-31 MED ORDER — HYDROCODONE-HOMATROPINE 5-1.5 MG/5ML PO SYRP: 5.0000 mL | ORAL_SOLUTION | Freq: Four times a day (QID) | ORAL | 0 refills | Status: AC | PRN

## 2017-10-31 MED FILL — predniSONE 10 MG TABS: 10 | 5 days supply | Qty: 10 | Fill #0

## 2017-10-31 MED FILL — AZITHROMYCIN 250 MG TABS: 250 | 5 days supply | Qty: 6 | Fill #0

## 2017-10-31 MED FILL — HYDROCODONE-HOMATROPINE SOL: 5-1.5 | 9 days supply | Qty: 180 | Fill #0

## 2017-10-31 NOTE — Addendum Note (Signed)
Addended by: Biagio Borg on: 10/31/2017 11:15 AM   Modules accepted: Orders

## 2017-10-31 NOTE — Assessment & Plan Note (Signed)
Stable, for f/u lab next visit

## 2017-10-31 NOTE — Assessment & Plan Note (Addendum)
Mild to mod, likely bronchospastic type, for cxr, for predpac course, cough med prn, to f/u any worsening symptoms or concerns

## 2017-10-31 NOTE — Assessment & Plan Note (Signed)
Mild to mod, c/w bronchitis vs pna, for cxr, for antibx course cough med prn,,  to f/u any worsening symptoms or concerns

## 2017-10-31 NOTE — Patient Instructions (Signed)
Ok to continue off the metformin and hold on starting other medication for sugar today  Please take all new medication as prescribed - the antibiotic, cough medicine, and prednisone  Please continue all other medications as before, and refills have been done if requested.  Please have the pharmacy call with any other refills you may need.  Please continue your efforts at being more active, low cholesterol diet, and weight control.  Please keep your appointments with your specialists as you may have planned  Please go to the XRAY Department in the Basement (go straight as you get off the elevator) for the x-ray testing  You will be contacted by phone if any changes need to be made immediately.  Otherwise, you will receive a letter about your results with an explanation, but please check with MyChart first.  Please remember to sign up for MyChart if you have not done so, as this will be important to you in the future with finding out test results, communicating by private email, and scheduling acute appointments online when needed.

## 2017-10-31 NOTE — Telephone Encounter (Signed)
Sorry, this was corrected

## 2017-10-31 NOTE — Assessment & Plan Note (Signed)
Metformin to be stopped due to CKD, to consider tradjenta but pt wants to wait for lab f/u in July

## 2017-10-31 NOTE — Telephone Encounter (Signed)
Copied from [place] [phone]. Topic: General - Other >> Oct 31, 2017 10:59 AM Yvette Rack wrote: Reason for CRM: Monica with [facility] called in regarding Rx predniSONE (DELTASONE) 10 MG tablet. Brayton Layman states the amount of tablets does not match what the prescription states. Request a return call as soon as possible as pt is at pharmacy to pick up Rx

## 2017-10-31 NOTE — Progress Notes (Signed)
Subjective:    Patient ID: [name], female    DOB: 28-Nov-1929, 84 y.o.   MRN: [id number]  HPI    Here to f/u - "I've been sick."   Here with 2 wks acute onset fever, facial pain, pressure, headache, general weakness and malaise, and greenish d/c, with mild ST but markedly prod cough with dark and green sputum with SOB and wheezing (now better today after some less congestion today) but pt denies chest pain, orthopnea, PND, increased LE swelling, palpitations, dizziness or syncope.  Husbnd ill with similar in the past wk.  Also Denies hyper or hypo thyroid symptoms such as voice, skin or hair change,m but had to stop the thyroid med due to nightmares.   Had normal renal u/s per renal recently, but had to stop the metformin due to CKD, off for about 1 mo.  Last a1c 7.0 in jan 2019.  Pt denies chest pain, increased sob or doe, wheezing, orthopnea, PND, palpitations, dizziness or syncope, but has had bilat ankle swelling mild worse since onset cough illness.  Past Medical History:  Diagnosis Date  . Anemia   . Anxiety   . Atrial flutter ([place])    "sometimes"  . Blood transfusion   . Breast cancer ([place])    right breast  . Bronchitis   . Chronic kidney disease    occ uti's  . CKD (chronic kidney disease) stage 3, GFR 30-59 ml/min ([name])   . Colon polyps    hyperplastic  . CVA (cerebral infarction)   . Diverticulosis   . DVT of leg (deep venous thrombosis) ([name])    left; S/P OR  . Dysrhythmia    dr bensimhon   . Esophageal stricture   . GERD (gastroesophageal reflux disease)   . Gout   . Hiatal hernia    4 cm  . History of radiation therapy 02/15/12-04/02/12   right breast 4680 cGy/26 sessions, right boost=1400cGy /7 sessions  . Hyperlipidemia   . Hypertension   . IBS (irritable bowel syndrome)   . Kidney stones 1990's  . Morbid obesity ([place])   . Osteoarthritis   . Personal history of radiation therapy   . Pneumonia    "walking"  . PONV (postoperative nausea and vomiting)   .  Shingles 02/21/2011  . Shortness of breath on exertion   . Skin cancer   . Stroke [facility]) 2002   residual:  "little tingling in palm of left hand"  . UTI (lower urinary tract infection)    "I've had a few"   Past Surgical History:  Procedure Laterality Date  . ABDOMINAL HYSTERECTOMY    . BILE DUCT STENT PLACEMENT  ~ 01/2011  . BREAST LUMPECTOMY  06/29/11   right  . BREAST LUMPECTOMY  06/29/2011   Procedure: BREAST LUMPECTOMY WITH EXCISION OF SENTINEL NODE;  Surgeon: Pedro Earls, MD;  Location: [place];  Service: General;  Laterality: Right;  right sentinel node mapping,right sentinel node biopsy, needle localization right breast lumpectomy  . CATARACT EXTRACTION W/ INTRAOCULAR LENS  IMPLANT, BILATERAL  1990's  . CHOLECYSTECTOMY  03/23/11   lap chole   . COLON SURGERY     hole in intestine ,tumors?  . colonic perforation repair  ~ 2000  . [place]  2012  . INCISION AND DRAINAGE ABSCESS N/A 08/12/2013   Procedure: INCISION AND DRAINAGE ABSCESS;  Surgeon: Earnstine Regal, MD;  [facility];  Service: General;  Laterality: N/A;  . INCISION AND DRAINAGE ABSCESS N/A  08/20/2013   Procedure: INCISION AND DRAINAGE ABSCESS;  Surgeon: Ralene Ok, MD;  [facility];  Service: General;  Laterality: N/A;  . INCISION AND DRAINAGE BREAST ABSCESS     right breast seroma  . IRRIGATION AND DEBRIDEMENT ABSCESS N/A 08/14/2013   Procedure: Debridment of perineal tissue and debridement of perineal wound;  Surgeon: Earnstine Regal, MD;  [facility];  Service: General;  Laterality: N/A;  . IRRIGATION AND DEBRIDEMENT ABSCESS N/A 08/16/2013   Procedure: DRESSING CHANGE AND DEBRIDEMENT OF PERINEUM WOUND;  Surgeon: Earnstine Regal, MD;  [facility];  Service: General;  Laterality: N/A;  . JOINT REPLACEMENT    . MINOR APPLICATION OF WOUND VAC N/A 08/20/2013   Procedure: MINOR APPLICATION OF WOUND VAC;  Surgeon: Ralene Ok, MD;  [facility];  Service: General;  Laterality: N/A;  .  pelvic bone tumor removal     twice in 2000's  . PILONIDAL CYST DRAINAGE N/A 08/19/2013   Procedure: Dressing change perineum;  Surgeon: Ralene Ok, MD;  [facility];  Service: General;  Laterality: N/A;  . REPLACEMENT TOTAL KNEE BILATERAL  ~ 2008; ~ 2010   right; left  . TONSILLECTOMY  ~ 1946  . TOTAL HIP ARTHROPLASTY  1980's   right    reports that she has never smoked. She has never used smokeless tobacco. She reports that she does not drink alcohol or use drugs. family history includes Breast cancer in her sister; Diabetes in her sister; Heart disease in her maternal uncle and paternal uncle; Kidney failure in her sister; Prostate cancer in her brother; Stroke in her mother. Allergies  Allergen Reactions  . Ace Inhibitors Other (See Comments)    Tolerates lisinopril at home. Pt does not recall allergy.  . Atorvastatin     REACTION: myalgias  . Oxycodone-Acetaminophen     REACTION: confusion, fatigue  . Rosuvastatin     REACTION: leg weakness  . Simvastatin     REACTION: leg weakness  . Codeine Nausea And Vomiting and Rash  . Sulfonamide Derivatives Hives and Rash   Current Outpatient Medications on File Prior to Visit  Medication Sig Dispense Refill  . allopurinol (ZYLOPRIM) 100 MG tablet TAKE 2 TABLETS BY MOUTH EVERY OTHER DAY AND 1 TABLET EVERY OTHER DAY 135 tablet 3  . amLODipine (NORVASC) 5 MG tablet TAKE 1 TABLET BY MOUTH ONCE DAILY 90 tablet 2  . anastrozole (ARIMIDEX) 1 MG tablet Take 1 tablet (1 mg total) by mouth daily. 90 tablet 4  . aspirin 325 MG EC tablet Take 1 tablet (325 mg total) by mouth every other day.    . cholecalciferol (VITAMIN D) 1000 UNITS tablet Take 1,000 Units by mouth every morning.     . ezetimibe (ZETIA) 10 MG tablet Take 1 tablet (10 mg total) by mouth daily. 90 tablet 3  . furosemide (LASIX) 40 MG tablet TAKE 1 TABLET BY MOUTH EVERY MORNING. 90 tablet 3  . lisinopril (PRINIVIL,ZESTRIL) 20 MG tablet TAKE 2 TABLETS BY MOUTH ONCE DAILY  180 tablet 3  . nitrofurantoin (MACRODANTIN) 50 MG capsule Take 1 capsule (50 mg total) by mouth 2 (two) times daily. 20 capsule 0  . pantoprazole (PROTONIX) 40 MG tablet Take 1 tablet (40 mg total) by mouth daily. 30 tablet 0  . traMADol-acetaminophen (ULTRACET) 37.5-325 MG tablet TAKE ONE TABLET BY MOUTH EVERY 6 HOURS AS NEEDED 120 tablet 2   No current facility-administered medications on file prior to visit.    Review of  Systems  Constitutional: Negative for other unusual diaphoresis or sweats HENT: Negative for ear discharge or swelling Eyes: Negative for other worsening visual disturbances Respiratory: Negative for stridor or other swelling  Gastrointestinal: Negative for worsening distension or other blood Genitourinary: Negative for retention or other urinary change Musculoskeletal: Negative for other MSK pain or swelling Skin: Negative for color change or other new lesions Neurological: Negative for worsening tremors and other numbness  Psychiatric/Behavioral: Negative for worsening agitation or other fatigue All other system neg pre pt    Objective:   Physical Exam BP 134/82   Pulse 85   Temp 98.2 F (36.8 C) (Oral)   Ht 5\' 5"  (1.651 m)   Wt 222 lb (100.7 kg)   SpO2 95%   BMI 36.94 kg/m  VS noted, mild ill appearing Constitutional: Pt appears in NAD HENT: Head: NCAT.  Right Ear: External ear normal.  Left Ear: External ear normal.  Bilat tm's with mild erythema.  Max sinus areas mild tender.  Pharynx with mild erythema, no exudate Eyes: . Pupils are equal, round, and reactive to light. Conjunctivae and EOM are normal Nose: without d/c or deformity Neck: Neck supple. Gross normal ROM Cardiovascular: Normal rate and regular rhythm.   Pulmonary/Chest: Effort normal and breath sounds decreased without rales but with mild scattered wheezing.  Abd:  Soft, NT, ND, + BS, no organomegaly Neurological: Pt is alert. At baseline orientation, motor grossly intact Skin: Skin  is warm. No rashes, other new lesions, no LE edema Psychiatric: Pt behavior is normal without agitation  No other exam findings  Lab Results  Component Value Date   WBC 11.0 (H) 06/19/2017   HGB 12.7 06/19/2017   HCT 40.4 06/19/2017   PLT 243.0 06/19/2017   GLUCOSE 123 (H) 06/19/2017   CHOL 207 (H) 06/19/2017   TRIG 238.0 (H) 06/19/2017   HDL 33.70 (L) 06/19/2017   LDLDIRECT 150.0 06/19/2017   LDLCALC 91 11/15/2016   ALT 13 06/19/2017   AST 20 06/19/2017   NA 140 06/19/2017   K 3.9 06/19/2017   CL 101 06/19/2017   CREATININE 1.66 (H) 06/19/2017   BUN 52 (H) 06/19/2017   CO2 29 06/19/2017   TSH 6.18 (H) 06/19/2017   INR 1.05 06/18/2014   HGBA1C 7.0 (H) 06/19/2017   MICROALBUR 3.2 (H) 06/19/2017      Assessment & Plan:

## 2017-10-31 NOTE — Assessment & Plan Note (Signed)
Pt with c/o nightmares on low dose levothyroxine 25, has stopped, will f/u lab with next visit

## 2017-11-01 ENCOUNTER — Telehealth: Payer: Self-pay | Admitting: Internal Medicine

## 2017-11-01 NOTE — Telephone Encounter (Signed)
Copied from [place] [phone]. Topic: Quick Communication - See Telephone Encounter >> Nov 01, 2017  1:05 PM Ether Griffins B wrote: CRM for notification. See Telephone encounter for: 11/01/17.  Pt calling checking on report of chest xray that was done on 10/31/17

## 2017-11-01 NOTE — Telephone Encounter (Signed)
Pt has been informed and expressed understanding.  

## 2017-11-08 MED ORDER — LEVOFLOXACIN 250 MG PO TABS: 250.0000 mg | ORAL_TABLET | Freq: Every day | ORAL | 0 refills | Status: DC

## 2017-11-08 MED FILL — levoFLOXacin 500 MG TABS: 500 | 10 days supply | Qty: 5 | Fill #0

## 2017-11-08 NOTE — Addendum Note (Signed)
Addended by: Biagio Borg on: 11/08/2017 12:28 PM   Modules accepted: Orders

## 2017-11-08 NOTE — Telephone Encounter (Signed)
[place] for levaquin asd - done to [facility]

## 2017-11-08 NOTE — Telephone Encounter (Signed)
Pt has been informed and expressed understanding.  

## 2017-11-08 NOTE — Telephone Encounter (Signed)
Patient stated she finished the antibiotics that were prescribed for her but she is still coughing up yellow mucus and has a fever.  She is thinking she needs more antibiotics.  Please advise.

## 2017-11-21 ENCOUNTER — Other Ambulatory Visit (HOSPITAL_COMMUNITY)
Admission: RE | Admit: 2017-11-21 | Discharge: 2017-11-21 | Disposition: A | Discharge status: Home / Self Care | Payer: Medicare Other | Source: Other Acute Inpatient Hospital | Attending: Orthopedic Surgery | Admitting: Orthopedic Surgery

## 2017-11-21 DIAGNOSIS — L97509 Non-pressure chronic ulcer of other part of unspecified foot with unspecified severity: Secondary | ICD-10-CM | POA: Diagnosis not present

## 2017-11-21 DIAGNOSIS — M79675 Pain in left toe(s): Secondary | ICD-10-CM | POA: Diagnosis not present

## 2017-11-21 MED FILL — CEPHALEXIN 500 MG CAPSULE: 500 | 7 days supply | Qty: 28 | Fill #0

## 2017-11-26 LAB — ANAEROBIC CULTURE: Culture: NEGATIVE

## 2017-11-27 DIAGNOSIS — M2041 Other hammer toe(s) (acquired), right foot: Secondary | ICD-10-CM | POA: Diagnosis not present

## 2017-11-27 DIAGNOSIS — M79675 Pain in left toe(s): Secondary | ICD-10-CM | POA: Diagnosis not present

## 2017-11-27 DIAGNOSIS — L97502 Non-pressure chronic ulcer of other part of unspecified foot with fat layer exposed: Secondary | ICD-10-CM | POA: Diagnosis not present

## 2017-12-01 ENCOUNTER — Encounter: Payer: Self-pay | Admitting: Internal Medicine

## 2017-12-01 ENCOUNTER — Ambulatory Visit (INDEPENDENT_AMBULATORY_CARE_PROVIDER_SITE_OTHER): Payer: Medicare Other | Admitting: Internal Medicine

## 2017-12-01 VITALS — BP 168/84 | HR 74 | Temp 97.7°F | Ht 65.0 in

## 2017-12-01 DIAGNOSIS — E119 Type 2 diabetes mellitus without complications: Secondary | ICD-10-CM

## 2017-12-01 DIAGNOSIS — E785 Hyperlipidemia, unspecified: Secondary | ICD-10-CM

## 2017-12-01 DIAGNOSIS — I10 Essential (primary) hypertension: Secondary | ICD-10-CM

## 2017-12-01 DIAGNOSIS — N183 Chronic kidney disease, stage 3 unspecified: Secondary | ICD-10-CM

## 2017-12-01 DIAGNOSIS — Z8631 Personal history of diabetic foot ulcer: Secondary | ICD-10-CM | POA: Diagnosis not present

## 2017-12-01 LAB — POCT GLYCOSYLATED HEMOGLOBIN (HGB A1C): Hemoglobin A1C: 6.8 % — AB (ref 4.0–5.6)

## 2017-12-01 NOTE — Assessment & Plan Note (Signed)
BP Readings from Last 3 Encounters:  12/01/17 (!) 168/84  10/31/17 134/82  06/19/17 (!) 142/86  mod elevated today, likely reactive, cont same tx, f/u at home and next visit

## 2017-12-01 NOTE — Assessment & Plan Note (Signed)
Now off metformin, for POCT a1c now

## 2017-12-01 NOTE — Patient Instructions (Addendum)
Your a1c was OK today at 6.8, so no need to start new medication  Your Diabetic shoe form was signed  Please continue all other medications as before, and refills have been done if requested.  Please have the pharmacy call with any other refills you may need.  Please continue your efforts at being more active, low cholesterol diabetic diet, and weight control.  Please keep your appointments with your specialists as you may have planned  Please return in 6 months, or sooner if needed, with Lab testing done 3-5 days before

## 2017-12-01 NOTE — Progress Notes (Addendum)
Subjective:    Patient ID: [name], female    DOB: 02-13-1930, 84 y.o.   MRN: [id number]  HPI  Here to f/u; overall doing ok,  Pt denies chest pain, increasing sob or doe, wheezing, orthopnea, PND, increased LE swelling, palpitations, dizziness or syncope.  Pt denies new neurological symptoms such as new headache, or facial or extremity weakness or numbness.  Pt denies polydipsia, polyuria, or low sugar episode.  Pt states overall good compliance with meds, mostly trying to follow appropriate diet, with wt overall stable,  but little exercise however.  Did see podiatry recently with DM neuropathic left great toe ulcer with infection now nearly resolved.  Needs DM shoes. Also off of metformin now due to CKD since early April. Seeing renal .  Rushed to get here to day with elevated BP. No other new complaints. Past Medical History:  Diagnosis Date  . Anemia   . Anxiety   . Atrial flutter ([place])    "sometimes"  . Blood transfusion   . Breast cancer ([place])    right breast  . Bronchitis   . Chronic kidney disease    occ uti's  . CKD (chronic kidney disease) stage 3, GFR 30-59 ml/min ([name])   . Colon polyps    hyperplastic  . CVA (cerebral infarction)   . Diverticulosis   . DVT of leg (deep venous thrombosis) ([name])    left; S/P OR  . Dysrhythmia    dr bensimhon   . Esophageal stricture   . GERD (gastroesophageal reflux disease)   . Gout   . Hiatal hernia    4 cm  . History of radiation therapy 02/15/12-04/02/12   right breast 4680 cGy/26 sessions, right boost=1400cGy /7 sessions  . Hyperlipidemia   . Hypertension   . IBS (irritable bowel syndrome)   . Kidney stones 1990's  . Morbid obesity ([place])   . Osteoarthritis   . Personal history of radiation therapy   . Pneumonia    "walking"  . PONV (postoperative nausea and vomiting)   . Shingles 02/21/2011  . Shortness of breath on exertion   . Skin cancer   . Stroke [facility]) 2002   residual:  "little tingling in palm of left hand"    . UTI (lower urinary tract infection)    "I've had a few"   Past Surgical History:  Procedure Laterality Date  . ABDOMINAL HYSTERECTOMY    . BILE DUCT STENT PLACEMENT  ~ 01/2011  . BREAST LUMPECTOMY  06/29/11   right  . BREAST LUMPECTOMY  06/29/2011   Procedure: BREAST LUMPECTOMY WITH EXCISION OF SENTINEL NODE;  Surgeon: Pedro Earls, MD;  Location: [place];  Service: General;  Laterality: Right;  right sentinel node mapping,right sentinel node biopsy, needle localization right breast lumpectomy  . CATARACT EXTRACTION W/ INTRAOCULAR LENS  IMPLANT, BILATERAL  1990's  . CHOLECYSTECTOMY  03/23/11   lap chole   . COLON SURGERY     hole in intestine ,tumors?  . colonic perforation repair  ~ 2000  . [place]  2012  . INCISION AND DRAINAGE ABSCESS N/A 08/12/2013   Procedure: INCISION AND DRAINAGE ABSCESS;  Surgeon: Earnstine Regal, MD;  [facility];  Service: General;  Laterality: N/A;  . INCISION AND DRAINAGE ABSCESS N/A 08/20/2013   Procedure: INCISION AND DRAINAGE ABSCESS;  Surgeon: Ralene Ok, MD;  [facility];  Service: General;  Laterality: N/A;  . INCISION AND DRAINAGE BREAST ABSCESS     right breast  seroma  . IRRIGATION AND DEBRIDEMENT ABSCESS N/A 08/14/2013   Procedure: Debridment of perineal tissue and debridement of perineal wound;  Surgeon: Earnstine Regal, MD;  [facility];  Service: General;  Laterality: N/A;  . IRRIGATION AND DEBRIDEMENT ABSCESS N/A 08/16/2013   Procedure: DRESSING CHANGE AND DEBRIDEMENT OF PERINEUM WOUND;  Surgeon: Earnstine Regal, MD;  [facility];  Service: General;  Laterality: N/A;  . JOINT REPLACEMENT    . MINOR APPLICATION OF WOUND VAC N/A 08/20/2013   Procedure: MINOR APPLICATION OF WOUND VAC;  Surgeon: Ralene Ok, MD;  [facility];  Service: General;  Laterality: N/A;  . pelvic bone tumor removal     twice in 2000's  . PILONIDAL CYST DRAINAGE N/A 08/19/2013   Procedure: Dressing change perineum;  Surgeon: Ralene Ok, MD;  [facility];  Service: General;  Laterality: N/A;  . REPLACEMENT TOTAL KNEE BILATERAL  ~ 2008; ~ 2010   right; left  . TONSILLECTOMY  ~ 1946  . TOTAL HIP ARTHROPLASTY  1980's   right    reports that she has never smoked. She has never used smokeless tobacco. She reports that she does not drink alcohol or use drugs. family history includes Breast cancer in her sister; Diabetes in her sister; Heart disease in her maternal uncle and paternal uncle; Kidney failure in her sister; Prostate cancer in her brother; Stroke in her mother. Allergies  Allergen Reactions  . Ace Inhibitors Other (See Comments)    Tolerates lisinopril at home. Pt does not recall allergy.  . Atorvastatin     REACTION: myalgias  . Oxycodone-Acetaminophen     REACTION: confusion, fatigue  . Rosuvastatin     REACTION: leg weakness  . Simvastatin     REACTION: leg weakness  . Codeine Nausea And Vomiting and Rash  . Sulfonamide Derivatives Hives and Rash   Current Outpatient Medications on File Prior to Visit  Medication Sig Dispense Refill  . allopurinol (ZYLOPRIM) 100 MG tablet TAKE 2 TABLETS BY MOUTH EVERY OTHER DAY AND 1 TABLET EVERY OTHER DAY 135 tablet 3  . amLODipine (NORVASC) 5 MG tablet TAKE 1 TABLET BY MOUTH ONCE DAILY 90 tablet 2  . anastrozole (ARIMIDEX) 1 MG tablet Take 1 tablet (1 mg total) by mouth daily. 90 tablet 4  . aspirin 325 MG EC tablet Take 1 tablet (325 mg total) by mouth every other day.    . cholecalciferol (VITAMIN D) 1000 UNITS tablet Take 1,000 Units by mouth every morning.     . ezetimibe (ZETIA) 10 MG tablet Take 1 tablet (10 mg total) by mouth daily. 90 tablet 3  . furosemide (LASIX) 40 MG tablet TAKE 1 TABLET BY MOUTH EVERY MORNING. 90 tablet 3  . lisinopril (PRINIVIL,ZESTRIL) 20 MG tablet TAKE 2 TABLETS BY MOUTH ONCE DAILY 180 tablet 3  . nitrofurantoin (MACRODANTIN) 50 MG capsule Take 1 capsule (50 mg total) by mouth 2 (two) times daily. 20 capsule 0  . pantoprazole  (PROTONIX) 40 MG tablet Take 1 tablet (40 mg total) by mouth daily. 30 tablet 0  . predniSONE (DELTASONE) 10 MG tablet 2 tabs by mouth per day for 5 days 10 tablet 0  . traMADol-acetaminophen (ULTRACET) 37.5-325 MG tablet TAKE ONE TABLET BY MOUTH EVERY 6 HOURS AS NEEDED 120 tablet 2   No current facility-administered medications on file prior to visit.    Review of Systems  Constitutional: Negative for other unusual diaphoresis or sweats HENT: Negative for ear discharge or swelling  Eyes: Negative for other worsening visual disturbances Respiratory: Negative for stridor or other swelling  Gastrointestinal: Negative for worsening distension or other blood Genitourinary: Negative for retention or other urinary change Musculoskeletal: Negative for other MSK pain or swelling Skin: Negative for color change or other new lesions Neurological: Negative for worsening tremors and other numbness  Psychiatric/Behavioral: Negative for worsening agitation or other fatigue All other system neg per pt    Objective:   Physical Exam BP (!) 168/84   Pulse 74   Temp 97.7 F (36.5 C) (Oral)   Ht 5\' 5"  (1.651 m)   SpO2 97%   BMI 36.94 kg/m  VS noted,  Constitutional: Pt appears in NAD HENT: Head: NCAT.  Right Ear: External ear normal.  Left Ear: External ear normal.  Eyes: . Pupils are equal, round, and reactive to light. Conjunctivae and EOM are normal Nose: without d/c or deformity Neck: Neck supple. Gross normal ROM Cardiovascular: Normal rate and regular rhythm.   Pulmonary/Chest: Effort normal and breath sounds without rales or wheezing.  Neurological: Pt is alert. At baseline orientation, motor grossly intact Skin: Skin is warm. No rashes, other new lesions, no LE edema, left great toe with healed medial plantar ulceration without callous or s/s cellulitis  Psychiatric: Pt behavior is normal without agitation  No other exam findings  Lab Results  Component Value Date   WBC 11.0 (H)  06/19/2017   HGB 12.7 06/19/2017   HCT 40.4 06/19/2017   PLT 243.0 06/19/2017   GLUCOSE 123 (H) 06/19/2017   CHOL 207 (H) 06/19/2017   TRIG 238.0 (H) 06/19/2017   HDL 33.70 (L) 06/19/2017   LDLDIRECT 150.0 06/19/2017   LDLCALC 91 11/15/2016   ALT 13 06/19/2017   AST 20 06/19/2017   NA 140 06/19/2017   K 3.9 06/19/2017   CL 101 06/19/2017   CREATININE 1.66 (H) 06/19/2017   BUN 52 (H) 06/19/2017   CO2 29 06/19/2017   TSH 6.18 (H) 06/19/2017   INR 1.05 06/18/2014   HGBA1C 7.0 (H) 06/19/2017   MICROALBUR 3.2 (H) 06/19/2017   POCT glycosylated hemoglobin (Hb A1C)  Order: [id number]  Status:  Final result Visible to patient:  No (Not Released) Dx:  Type 2 diabetes mellitus without comp...   Ref Range & Units 10:48 (12/01/17) 25mo ago (06/19/17) 75yr ago (11/15/16) 14yr ago (04/20/16)  Hemoglobin A1C 4.0 - 5.6 % 6.8Abnormal   7.0High  R, CM 7.3High  R, CM 7.8High  R, CM  HbA1c POC (<> result, manual entry) 4.0 - 5.6 %               Assessment & Plan:

## 2017-12-01 NOTE — Assessment & Plan Note (Signed)
Cont's to follow with renal

## 2017-12-01 NOTE — Assessment & Plan Note (Signed)
stable overall by history and exam, recent data reviewed with pt, and pt to continue medical treatment as before,  to f/u any worsening symptoms or concerns Lab Results  Component Value Date   LDLCALC 91 11/15/2016

## 2017-12-01 NOTE — Assessment & Plan Note (Signed)
Exam benign today, but will need DM shoes - form signed today

## 2017-12-08 ENCOUNTER — Telehealth: Payer: Self-pay

## 2017-12-08 NOTE — Telephone Encounter (Signed)
Faxed

## 2017-12-08 NOTE — Telephone Encounter (Signed)
Forms were received and placed on PCP's desk. Once signed they will be faxed back.  Copied from [place] [phone]. Topic: Inquiry >> Dec 08, 2017  8:48 AM Scherrie Gerlach wrote: Reason for CRM: [facility] would like to confirm you received paperwork (fax) for diabetic shoes to be signed off on. If you did not, please call.  But she did get confirmation

## 2017-12-11 DIAGNOSIS — E11621 Type 2 diabetes mellitus with foot ulcer: Secondary | ICD-10-CM | POA: Diagnosis not present

## 2017-12-19 ENCOUNTER — Ambulatory Visit: Payer: Medicare Other | Admitting: Internal Medicine

## 2017-12-25 DIAGNOSIS — E11621 Type 2 diabetes mellitus with foot ulcer: Secondary | ICD-10-CM | POA: Diagnosis not present

## 2017-12-28 DIAGNOSIS — D631 Anemia in chronic kidney disease: Secondary | ICD-10-CM | POA: Diagnosis not present

## 2017-12-28 DIAGNOSIS — N184 Chronic kidney disease, stage 4 (severe): Secondary | ICD-10-CM | POA: Diagnosis not present

## 2017-12-28 DIAGNOSIS — I129 Hypertensive chronic kidney disease with stage 1 through stage 4 chronic kidney disease, or unspecified chronic kidney disease: Secondary | ICD-10-CM | POA: Diagnosis not present

## 2018-01-03 DIAGNOSIS — M2041 Other hammer toe(s) (acquired), right foot: Secondary | ICD-10-CM | POA: Diagnosis not present

## 2018-01-03 DIAGNOSIS — L97508 Non-pressure chronic ulcer of other part of unspecified foot with other specified severity: Secondary | ICD-10-CM | POA: Diagnosis not present

## 2018-01-03 DIAGNOSIS — E1142 Type 2 diabetes mellitus with diabetic polyneuropathy: Secondary | ICD-10-CM | POA: Diagnosis not present

## 2018-01-18 MED FILL — ALLOPURINOL 100 MG TABLET: 100 | 90 days supply | Qty: 135 | Fill #2

## 2018-01-18 MED FILL — AMLODIPINE BESYLATE 5 MG TA: 5 | 90 days supply | Qty: 90 | Fill #1

## 2018-01-18 MED FILL — LISINOPRIL 20 MG TABLET: 20 | 90 days supply | Qty: 180 | Fill #2

## 2018-01-18 MED FILL — FUROSEMIDE 40 MG TAB: 40 | 90 days supply | Qty: 90 | Fill #2

## 2018-01-19 DIAGNOSIS — Z853 Personal history of malignant neoplasm of breast: Secondary | ICD-10-CM | POA: Diagnosis not present

## 2018-01-25 DIAGNOSIS — E11621 Type 2 diabetes mellitus with foot ulcer: Secondary | ICD-10-CM | POA: Diagnosis not present

## 2018-01-30 DIAGNOSIS — E11621 Type 2 diabetes mellitus with foot ulcer: Secondary | ICD-10-CM | POA: Diagnosis not present

## 2018-02-22 DIAGNOSIS — L97529 Non-pressure chronic ulcer of other part of left foot with unspecified severity: Secondary | ICD-10-CM | POA: Diagnosis not present

## 2018-02-22 DIAGNOSIS — L97519 Non-pressure chronic ulcer of other part of right foot with unspecified severity: Secondary | ICD-10-CM | POA: Diagnosis not present

## 2018-03-08 DIAGNOSIS — M2012 Hallux valgus (acquired), left foot: Secondary | ICD-10-CM | POA: Diagnosis not present

## 2018-03-08 DIAGNOSIS — L97522 Non-pressure chronic ulcer of other part of left foot with fat layer exposed: Secondary | ICD-10-CM | POA: Diagnosis not present

## 2018-03-08 DIAGNOSIS — T8189XA Other complications of procedures, not elsewhere classified, initial encounter: Secondary | ICD-10-CM | POA: Diagnosis not present

## 2018-03-08 DIAGNOSIS — M79672 Pain in left foot: Secondary | ICD-10-CM | POA: Diagnosis not present

## 2018-03-08 MED FILL — CEPHALEXIN 500 MG CAPSULE: 500 | 7 days supply | Qty: 14 | Fill #0

## 2018-03-15 DIAGNOSIS — L97522 Non-pressure chronic ulcer of other part of left foot with fat layer exposed: Secondary | ICD-10-CM | POA: Diagnosis not present

## 2018-04-05 DIAGNOSIS — L97512 Non-pressure chronic ulcer of other part of right foot with fat layer exposed: Secondary | ICD-10-CM | POA: Diagnosis not present

## 2018-04-05 DIAGNOSIS — S91109A Unspecified open wound of unspecified toe(s) without damage to nail, initial encounter: Secondary | ICD-10-CM | POA: Diagnosis not present

## 2018-04-16 DIAGNOSIS — L97512 Non-pressure chronic ulcer of other part of right foot with fat layer exposed: Secondary | ICD-10-CM | POA: Diagnosis not present

## 2018-04-16 DIAGNOSIS — L97521 Non-pressure chronic ulcer of other part of left foot limited to breakdown of skin: Secondary | ICD-10-CM | POA: Diagnosis not present

## 2018-04-17 MED FILL — FUROSEMIDE 40 MG TAB: 40 | 90 days supply | Qty: 90 | Fill #3

## 2018-04-17 MED FILL — LISINOPRIL 20 MG TABLET: 20 | 90 days supply | Qty: 180 | Fill #3

## 2018-04-17 MED FILL — AMLODIPINE BESYLATE 5 MG TA: 5 | 90 days supply | Qty: 90 | Fill #2

## 2018-04-17 MED FILL — ALLOPURINOL 100 MG TABLET: 100 | 90 days supply | Qty: 135 | Fill #3

## 2018-05-07 DIAGNOSIS — L97512 Non-pressure chronic ulcer of other part of right foot with fat layer exposed: Secondary | ICD-10-CM | POA: Diagnosis not present

## 2018-05-21 DIAGNOSIS — L97512 Non-pressure chronic ulcer of other part of right foot with fat layer exposed: Secondary | ICD-10-CM | POA: Diagnosis not present

## 2018-05-30 ENCOUNTER — Ambulatory Visit: Payer: Medicare Other | Admitting: Internal Medicine

## 2018-05-31 DIAGNOSIS — L97512 Non-pressure chronic ulcer of other part of right foot with fat layer exposed: Secondary | ICD-10-CM | POA: Diagnosis not present

## 2018-06-21 DIAGNOSIS — L97512 Non-pressure chronic ulcer of other part of right foot with fat layer exposed: Secondary | ICD-10-CM | POA: Diagnosis not present

## 2018-06-25 ENCOUNTER — Other Ambulatory Visit (INDEPENDENT_AMBULATORY_CARE_PROVIDER_SITE_OTHER): Payer: Medicare Other

## 2018-06-25 ENCOUNTER — Encounter: Payer: Self-pay | Admitting: Internal Medicine

## 2018-06-25 ENCOUNTER — Ambulatory Visit (INDEPENDENT_AMBULATORY_CARE_PROVIDER_SITE_OTHER): Payer: Medicare Other | Admitting: Internal Medicine

## 2018-06-25 VITALS — BP 146/78 | HR 76 | Temp 97.8°F | Ht 65.0 in | Wt 221.0 lb

## 2018-06-25 DIAGNOSIS — E119 Type 2 diabetes mellitus without complications: Secondary | ICD-10-CM

## 2018-06-25 DIAGNOSIS — E785 Hyperlipidemia, unspecified: Secondary | ICD-10-CM | POA: Diagnosis not present

## 2018-06-25 DIAGNOSIS — R7989 Other specified abnormal findings of blood chemistry: Secondary | ICD-10-CM

## 2018-06-25 DIAGNOSIS — Z0000 Encounter for general adult medical examination without abnormal findings: Secondary | ICD-10-CM

## 2018-06-25 LAB — URINALYSIS, ROUTINE W REFLEX MICROSCOPIC
Bilirubin Urine: NEGATIVE
Ketones, ur: NEGATIVE
Nitrite: POSITIVE — AB
Specific Gravity, Urine: 1.01 (ref 1.000–1.030)
Total Protein, Urine: NEGATIVE
Urine Glucose: NEGATIVE
Urobilinogen, UA: 0.2 (ref 0.0–1.0)
pH: 5.5 (ref 5.0–8.0)

## 2018-06-25 LAB — HEPATIC FUNCTION PANEL
ALT: 11 U/L (ref 0–35)
AST: 16 U/L (ref 0–37)
Albumin: 3.9 g/dL (ref 3.5–5.2)
Alkaline Phosphatase: 93 U/L (ref 39–117)
Bilirubin, Direct: 0.2 mg/dL (ref 0.0–0.3)
Total Bilirubin: 0.5 mg/dL (ref 0.2–1.2)
Total Protein: 8 g/dL (ref 6.0–8.3)

## 2018-06-25 LAB — BASIC METABOLIC PANEL
BUN: 58 mg/dL — ABNORMAL HIGH (ref 6–23)
CO2: 27 mEq/L (ref 19–32)
Calcium: 10.1 mg/dL (ref 8.4–10.5)
Chloride: 105 mEq/L (ref 96–112)
Creatinine, Ser: 1.7 mg/dL — ABNORMAL HIGH (ref 0.40–1.20)
GFR: 30.26 mL/min — ABNORMAL LOW (ref >60.00)
Glucose, Bld: 114 mg/dL — ABNORMAL HIGH (ref 70–99)
Potassium: 4.2 mEq/L (ref 3.5–5.1)
Sodium: 143 mEq/L (ref 135–145)

## 2018-06-25 LAB — LIPID PANEL
Cholesterol: 203 mg/dL — ABNORMAL HIGH (ref 0–200)
HDL: 39.2 mg/dL (ref >39.00)
LDL Cholesterol: 124 mg/dL — ABNORMAL HIGH (ref 0–99)
NonHDL: 163.46
Total CHOL/HDL Ratio: 5
Triglycerides: 195 mg/dL — ABNORMAL HIGH (ref 0.0–149.0)
VLDL: 39 mg/dL (ref 0.0–40.0)

## 2018-06-25 LAB — CBC WITH DIFFERENTIAL/PLATELET
Basophils Absolute: 0.1 10*3/uL (ref 0.0–0.1)
Basophils Relative: 0.8 % (ref 0.0–3.0)
Eosinophils Absolute: 0.2 10*3/uL (ref 0.0–0.7)
Eosinophils Relative: 2 % (ref 0.0–5.0)
HCT: 42.7 % (ref 36.0–46.0)
Hemoglobin: 13.7 g/dL (ref 12.0–15.0)
Lymphocytes Relative: 24.4 % (ref 12.0–46.0)
Lymphs Abs: 2.6 10*3/uL (ref 0.7–4.0)
MCHC: 32.2 g/dL (ref 30.0–36.0)
MCV: 92.6 fl (ref 78.0–100.0)
Monocytes Absolute: 0.9 10*3/uL (ref 0.1–1.0)
Monocytes Relative: 8.4 % (ref 3.0–12.0)
Neutro Abs: 7 10*3/uL (ref 1.4–7.7)
Neutrophils Relative %: 64.4 % (ref 43.0–77.0)
Platelets: 243 10*3/uL (ref 150.0–400.0)
RBC: 4.61 Mil/uL (ref 3.87–5.11)
RDW: 16.5 % — ABNORMAL HIGH (ref 11.5–15.5)
WBC: 10.8 10*3/uL — ABNORMAL HIGH (ref 4.0–10.5)

## 2018-06-25 LAB — HEMOGLOBIN A1C: Hgb A1c MFr Bld: 7.1 % — ABNORMAL HIGH (ref 4.6–6.5)

## 2018-06-25 LAB — TSH: TSH: 8 u[IU]/mL — ABNORMAL HIGH (ref 0.35–4.50)

## 2018-06-25 NOTE — Assessment & Plan Note (Signed)
Likely mild low thyroid but declines med tx for now

## 2018-06-25 NOTE — Patient Instructions (Addendum)
Please remember to call for you yearly eye doctor appointment.    Please continue all other medications as before, and refills have been done if requested.  Please have the pharmacy call with any other refills you may need.  Please continue your efforts at being more active, low cholesterol diet, and weight control.  You are otherwise up to date with prevention measures today.  Please keep your appointments with your specialists as you may have planned  Please go to the LAB in the Basement (turn left off the elevator) for the tests to be done today  You will be contacted by phone if any changes need to be made immediately.  Otherwise, you will receive a letter about your results with an explanation, but please check with MyChart first.  Please remember to sign up for MyChart if you have not done so, as this will be important to you in the future with finding out test results, communicating by private email, and scheduling acute appointments online when needed.  Please return in 6 months, or sooner if needed

## 2018-06-25 NOTE — Progress Notes (Signed)
Subjective:    Patient ID: [name], female    DOB: 1929-08-17, 85 y.o.   MRN: [id number]  HPI  Here for wellness and f/u;  Overall doing ok;  Pt denies Chest pain, worsening SOB, DOE, wheezing, orthopnea, PND, worsening LE edema, palpitations, dizziness or syncope.  Pt denies neurological change such as new headache, facial or extremity weakness.  Pt denies polydipsia, polyuria, or low sugar symptoms. Pt states overall good compliance with treatment and medications, good tolerability, and has been trying to follow appropriate diet.  Pt denies worsening depressive symptoms, suicidal ideation or panic. No fever, night sweats, wt loss, loss of appetite, or other constitutional symptoms.  Pt states good ability with ADL's, has low fall risk, home safety reviewed and adequate, no other significant changes in hearing or vision, and not active with exercise. Due to see optho.  Has hx of neuropathy with ulcer now improved; Uses the walker in the home from room to room due to bilat lower leg weakness after sacral issue x 5 yrs ago. Hs hx of left great toe ulcer, not better by PA for Dr Hewitt/ortho, but better now after podaitry tx, and sees regularly due to persistent callous to third toe distal right foot.  Plans to call Dr Sherlean Foot for yearly eye exam.  Had nightmares with levothyroxine per pt, does not want to take for now.  No other new complaints Past Medical History:  Diagnosis Date  . Anemia   . Anxiety   . Atrial flutter ([place])    "sometimes"  . Blood transfusion   . Breast cancer ([place])    right breast  . Bronchitis   . Chronic kidney disease    occ uti's  . CKD (chronic kidney disease) stage 3, GFR 30-59 ml/min ([name])   . Colon polyps    hyperplastic  . CVA (cerebral infarction)   . Diverticulosis   . DVT of leg (deep venous thrombosis) ([name])    left; S/P OR  . Dysrhythmia    dr bensimhon   . Esophageal stricture   . GERD (gastroesophageal reflux disease)   . Gout   . Hiatal hernia     4 cm  . History of radiation therapy 02/15/12-04/02/12   right breast 4680 cGy/26 sessions, right boost=1400cGy /7 sessions  . Hyperlipidemia   . Hypertension   . IBS (irritable bowel syndrome)   . Kidney stones 1990's  . Morbid obesity ([place])   . Osteoarthritis   . Personal history of radiation therapy   . Pneumonia    "walking"  . PONV (postoperative nausea and vomiting)   . Shingles 02/21/2011  . Shortness of breath on exertion   . Skin cancer   . Stroke [facility]) 2002   residual:  "little tingling in palm of left hand"  . UTI (lower urinary tract infection)    "I've had a few"   Past Surgical History:  Procedure Laterality Date  . ABDOMINAL HYSTERECTOMY    . BILE DUCT STENT PLACEMENT  ~ 01/2011  . BREAST LUMPECTOMY  06/29/11   right  . BREAST LUMPECTOMY  06/29/2011   Procedure: BREAST LUMPECTOMY WITH EXCISION OF SENTINEL NODE;  Surgeon: Pedro Earls, MD;  Location: [place];  Service: General;  Laterality: Right;  right sentinel node mapping,right sentinel node biopsy, needle localization right breast lumpectomy  . CATARACT EXTRACTION W/ INTRAOCULAR LENS  IMPLANT, BILATERAL  1990's  . CHOLECYSTECTOMY  03/23/11   lap chole   . COLON SURGERY  hole in intestine ,tumors?  . colonic perforation repair  ~ 2000  . [place]  2012  . INCISION AND DRAINAGE ABSCESS N/A 08/12/2013   Procedure: INCISION AND DRAINAGE ABSCESS;  Surgeon: Earnstine Regal, MD;  Location: [facility];  Service: General;  Laterality: N/A;  . INCISION AND DRAINAGE ABSCESS N/A 08/20/2013   Procedure: INCISION AND DRAINAGE ABSCESS;  Surgeon: Ralene Ok, MD;  Location: [facility];  Service: General;  Laterality: N/A;  . INCISION AND DRAINAGE BREAST ABSCESS     right breast seroma  . IRRIGATION AND DEBRIDEMENT ABSCESS N/A 08/14/2013   Procedure: Debridment of perineal tissue and debridement of perineal wound;  Surgeon: Earnstine Regal, MD;  Location: [facility];  Service: General;  Laterality: N/A;  . IRRIGATION AND  DEBRIDEMENT ABSCESS N/A 08/16/2013   Procedure: DRESSING CHANGE AND DEBRIDEMENT OF PERINEUM WOUND;  Surgeon: Earnstine Regal, MD;  Location: [facility];  Service: General;  Laterality: N/A;  . JOINT REPLACEMENT    . MINOR APPLICATION OF WOUND VAC N/A 08/20/2013   Procedure: MINOR APPLICATION OF WOUND VAC;  Surgeon: Ralene Ok, MD;  Location: [facility];  Service: General;  Laterality: N/A;  . pelvic bone tumor removal     twice in 2000's  . PILONIDAL CYST DRAINAGE N/A 08/19/2013   Procedure: Dressing change perineum;  Surgeon: Ralene Ok, MD;  Location: [facility];  Service: General;  Laterality: N/A;  . REPLACEMENT TOTAL KNEE BILATERAL  ~ 2008; ~ 2010   right; left  . TONSILLECTOMY  ~ 1946  . TOTAL HIP ARTHROPLASTY  1980's   right    reports that she has never smoked. She has never used smokeless tobacco. She reports that she does not drink alcohol or use drugs. family history includes Breast cancer in her sister; Diabetes in her sister; Heart disease in her maternal uncle and paternal uncle; Kidney failure in her sister; Prostate cancer in her brother; Stroke in her mother. Allergies  Allergen Reactions  . Ace Inhibitors Other (See Comments)    Tolerates lisinopril at home. Pt does not recall allergy.  . Atorvastatin     REACTION: myalgias  . Oxycodone-Acetaminophen     REACTION: confusion, fatigue  . Rosuvastatin     REACTION: leg weakness  . Simvastatin     REACTION: leg weakness  . Codeine Nausea And Vomiting and Rash  . Sulfonamide Derivatives Hives and Rash   Current Outpatient Medications on File Prior to Visit  Medication Sig Dispense Refill  . allopurinol (ZYLOPRIM) 100 MG tablet TAKE 2 TABLETS BY MOUTH EVERY OTHER DAY AND 1 TABLET EVERY OTHER DAY 135 tablet 3  . amLODipine (NORVASC) 5 MG tablet TAKE 1 TABLET BY MOUTH ONCE DAILY 90 tablet 2  . anastrozole (ARIMIDEX) 1 MG tablet Take 1 tablet (1 mg total) by mouth daily. 90 tablet 4  . aspirin 325 MG EC tablet Take 1 tablet (325  mg total) by mouth every other day.    . cholecalciferol (VITAMIN D) 1000 UNITS tablet Take 1,000 Units by mouth every morning.     . ezetimibe (ZETIA) 10 MG tablet Take 1 tablet (10 mg total) by mouth daily. 90 tablet 3  . furosemide (LASIX) 40 MG tablet TAKE 1 TABLET BY MOUTH EVERY MORNING. 90 tablet 3  . lisinopril (PRINIVIL,ZESTRIL) 20 MG tablet TAKE 2 TABLETS BY MOUTH ONCE DAILY 180 tablet 3  . nitrofurantoin (MACRODANTIN) 50 MG capsule Take 1 capsule (50 mg total) by mouth 2 (two) times daily. 20 capsule  0  . pantoprazole (PROTONIX) 40 MG tablet Take 1 tablet (40 mg total) by mouth daily. 30 tablet 0  . predniSONE (DELTASONE) 10 MG tablet 2 tabs by mouth per day for 5 days 10 tablet 0  . traMADol-acetaminophen (ULTRACET) 37.5-325 MG tablet TAKE ONE TABLET BY MOUTH EVERY 6 HOURS AS NEEDED 120 tablet 2   No current facility-administered medications on file prior to visit.    Review of Systems Constitutional: Negative for other unusual diaphoresis, sweats, appetite or weight changes HENT: Negative for other worsening hearing loss, ear pain, facial swelling, mouth sores or neck stiffness.   Eyes: Negative for other worsening pain, redness or other visual disturbance.  Respiratory: Negative for other stridor or swelling Cardiovascular: Negative for other palpitations or other chest pain  Gastrointestinal: Negative for worsening diarrhea or loose stools, blood in stool, distention or other pain Genitourinary: Negative for hematuria, flank pain or other change in urine volume.  Musculoskeletal: Negative for myalgias or other joint swelling.  Skin: Negative for other color change, or other wound or worsening drainage.  Neurological: Negative for other syncope or numbness. Hematological: Negative for other adenopathy or swelling Psychiatric/Behavioral: Negative for hallucinations, other worsening agitation, SI, self-injury, or new decreased concentration All other system neg per pt      Objective:   Physical Exam BP (!) 146/78   Pulse 76   Temp 97.8 F (36.6 C) (Oral)   Ht 5\' 5"  (1.651 m)   Wt 221 lb (100.2 kg)   SpO2 93%   BMI 36.78 kg/m  VS noted, severe obese, slow and stiff in movements, walks with walker Constitutional: Pt is oriented to person, place, and time. Appears well-developed and well-nourished, in no significant distress and comfortable Head: Normocephalic and atraumatic  Eyes: Conjunctivae and EOM are normal. Pupils are equal, round, and reactive to light Right Ear: External ear normal without discharge Left Ear: External ear normal without discharge Nose: Nose without discharge or deformity Mouth/Throat: Oropharynx is without other ulcerations and moist  Neck: Normal range of motion. Neck supple. No JVD present. No tracheal deviation present or significant neck LA or mass Cardiovascular: Normal rate, regular rhythm, normal heart sounds and intact distal pulses.   Pulmonary/Chest: WOB normal and breath sounds without rales or wheezing  Abdominal: Soft. Bowel sounds are normal. NT. No HSM  Musculoskeletal: Normal range of motion. Exhibits no edema Lymphadenopathy: Has no other cervical adenopathy.  Neurological: Pt is alert and oriented to person, place, and time. Pt has normal reflexes. No cranial nerve deficit. Motor grossly intact, Gait intact Skin: Skin is warm and dry. No rash noted or new ulcerations Psychiatric:  Has normal mood and affect. Behavior is normal without agitation No other exam findings Lab Results  Component Value Date   WBC 11.0 (H) 06/19/2017   HGB 12.7 06/19/2017   HCT 40.4 06/19/2017   PLT 243.0 06/19/2017   GLUCOSE 123 (H) 06/19/2017   CHOL 207 (H) 06/19/2017   TRIG 238.0 (H) 06/19/2017   HDL 33.70 (L) 06/19/2017   LDLDIRECT 150.0 06/19/2017   LDLCALC 91 11/15/2016   ALT 13 06/19/2017   AST 20 06/19/2017   NA 140 06/19/2017   K 3.9 06/19/2017   CL 101 06/19/2017   CREATININE 1.66 (H) 06/19/2017   BUN 52 (H)  06/19/2017   CO2 29 06/19/2017   TSH 6.18 (H) 06/19/2017   INR 1.05 06/18/2014   HGBA1C 6.8 (A) 12/01/2017   MICROALBUR 3.2 (H) 06/19/2017      Assessment &  Plan:

## 2018-06-25 NOTE — Assessment & Plan Note (Signed)
stable overall by history and exam, recent data reviewed with pt, and pt to continue medical treatment as before,  to f/u any worsening symptoms or concerns  

## 2018-06-25 NOTE — Assessment & Plan Note (Signed)
Has been statin intolerant in past, declines statin trial

## 2018-06-25 NOTE — Assessment & Plan Note (Signed)

## 2018-06-26 LAB — MICROALBUMIN / CREATININE URINE RATIO
Creatinine,U: 53 mg/dL
Microalb Creat Ratio: 7 mg/g (ref 0.0–30.0)
Microalb, Ur: 3.7 mg/dL — ABNORMAL HIGH (ref 0.0–1.9)

## 2018-06-27 ENCOUNTER — Telehealth: Payer: Self-pay

## 2018-06-27 ENCOUNTER — Other Ambulatory Visit: Payer: Self-pay | Admitting: Internal Medicine

## 2018-06-27 ENCOUNTER — Encounter: Payer: Self-pay | Admitting: Internal Medicine

## 2018-06-27 MED ORDER — LEVOTHYROXINE SODIUM 25 MCG PO TABS: 25.0000 ug | ORAL_TABLET | Freq: Every day | ORAL | 3 refills | Status: DC

## 2018-06-27 NOTE — Telephone Encounter (Signed)
Pt has been informed of results and expressed understanding.  °

## 2018-07-06 DIAGNOSIS — L97512 Non-pressure chronic ulcer of other part of right foot with fat layer exposed: Secondary | ICD-10-CM | POA: Diagnosis not present

## 2018-07-06 DIAGNOSIS — L97521 Non-pressure chronic ulcer of other part of left foot limited to breakdown of skin: Secondary | ICD-10-CM | POA: Diagnosis not present

## 2018-07-16 ENCOUNTER — Other Ambulatory Visit: Payer: Self-pay | Admitting: Internal Medicine

## 2018-07-16 MED FILL — AMLODIPINE BESYLATE 5 MG TA: 5 | 90 days supply | Qty: 90 | Fill #0

## 2018-07-16 MED FILL — FUROSEMIDE 40 MG TAB: 40 | 90 days supply | Qty: 90 | Fill #0

## 2018-07-16 MED FILL — ALLOPURINOL 100 MG TABS: 100 | 45 days supply | Qty: 135 | Fill #0

## 2018-07-16 MED FILL — LISINOPRIL 20 MG TABLET: 20 | 90 days supply | Qty: 180 | Fill #0

## 2018-07-19 DIAGNOSIS — L97512 Non-pressure chronic ulcer of other part of right foot with fat layer exposed: Secondary | ICD-10-CM | POA: Diagnosis not present

## 2018-08-07 DIAGNOSIS — D631 Anemia in chronic kidney disease: Secondary | ICD-10-CM | POA: Diagnosis not present

## 2018-08-07 DIAGNOSIS — I129 Hypertensive chronic kidney disease with stage 1 through stage 4 chronic kidney disease, or unspecified chronic kidney disease: Secondary | ICD-10-CM | POA: Diagnosis not present

## 2018-08-07 DIAGNOSIS — M109 Gout, unspecified: Secondary | ICD-10-CM | POA: Diagnosis not present

## 2018-08-07 DIAGNOSIS — N184 Chronic kidney disease, stage 4 (severe): Secondary | ICD-10-CM | POA: Diagnosis not present

## 2018-08-09 DIAGNOSIS — M2041 Other hammer toe(s) (acquired), right foot: Secondary | ICD-10-CM | POA: Diagnosis not present

## 2018-08-09 DIAGNOSIS — L97512 Non-pressure chronic ulcer of other part of right foot with fat layer exposed: Secondary | ICD-10-CM | POA: Diagnosis not present

## 2018-08-23 DIAGNOSIS — L97512 Non-pressure chronic ulcer of other part of right foot with fat layer exposed: Secondary | ICD-10-CM | POA: Diagnosis not present

## 2018-08-23 DIAGNOSIS — M12271 Villonodular synovitis (pigmented), right ankle and foot: Secondary | ICD-10-CM | POA: Diagnosis not present

## 2018-09-06 DIAGNOSIS — L97511 Non-pressure chronic ulcer of other part of right foot limited to breakdown of skin: Secondary | ICD-10-CM | POA: Diagnosis not present

## 2018-10-04 DIAGNOSIS — L97511 Non-pressure chronic ulcer of other part of right foot limited to breakdown of skin: Secondary | ICD-10-CM | POA: Diagnosis not present

## 2018-10-22 MED FILL — AMLODIPINE BESYLATE 5 MG TA: 5 | 90 days supply | Qty: 90 | Fill #1

## 2018-10-22 MED FILL — ALLOPURINOL 100 MG TABS: 100 | 45 days supply | Qty: 135 | Fill #1

## 2018-10-22 MED FILL — FUROSEMIDE 40 MG TAB: 40 | 90 days supply | Qty: 90 | Fill #1

## 2018-10-22 MED FILL — LISINOPRIL 20 MG TABLET: 20 | 90 days supply | Qty: 180 | Fill #1

## 2018-11-01 DIAGNOSIS — R2231 Localized swelling, mass and lump, right upper limb: Secondary | ICD-10-CM | POA: Diagnosis not present

## 2018-11-01 DIAGNOSIS — Z853 Personal history of malignant neoplasm of breast: Secondary | ICD-10-CM | POA: Diagnosis not present

## 2018-11-02 ENCOUNTER — Other Ambulatory Visit: Payer: Self-pay | Admitting: Surgery

## 2018-11-02 DIAGNOSIS — Z853 Personal history of malignant neoplasm of breast: Secondary | ICD-10-CM

## 2018-11-09 DIAGNOSIS — R2231 Localized swelling, mass and lump, right upper limb: Secondary | ICD-10-CM | POA: Diagnosis not present

## 2018-11-12 ENCOUNTER — Ambulatory Visit: Payer: Self-pay | Admitting: Surgery

## 2018-11-12 DIAGNOSIS — R2231 Localized swelling, mass and lump, right upper limb: Secondary | ICD-10-CM | POA: Diagnosis not present

## 2018-11-12 NOTE — H&P (Signed)
[name] Documented: 11/12/2018 10:35 AM Location: [facility] Patient #: [id number] DOB: February 07, 1930 Married / Language: English / Race: White Female  History of Present Illness (Ho Parisi A. Yarrow Linhart MD; 11/12/2018 10:48 AM) Patient words: he patient is a 85 year old female who presents with a skin neoplasm. She is status post right sentinel lymph node biopsy and lumpectomy for lobular carcinoma of the right upper outer quadrant on 06/29/11 by Dr. Hassell Done. She saw Dr. Hassell Done on 11/01/26 which time she had a 1.2 cm mass on the posterior aspect of her right arm. She states that this area started as a mole a few months ago. Dr. Hassell Done believed it could be cancer or exuberant growth/hyper-granulation tissue. He recommended excision in the operating room, but she presented on friday to urgent office with concerns of possible infection. She states it has been draining more over the past few days. Denies much pain. She is not on blood thinners but takes aspirin 325 mg every other day for history of stroke several years ago.   She states the area is sore and draining. She was seen Friday 10 years in office and comes in today to discuss surgical excision is Dr. Hassell Done is out of the office.  The patient is a 85 year old female.   Allergies (Sabrina Canty, CMA; 11/12/2018 10:35 AM) Atorvastatin Calcium *CHEMICALS* OxyCODONE HCl (Abuse Deter) *ANALGESICS - OPIOID* Simvastatin *ANTIHYPERLIPIDEMICS* Codeine Phosphate *ANALGESICS - OPIOID* Sulfacetamide *CHEMICALS* Allergies Reconciled  Medication History (Sabrina Canty, CMA; 11/12/2018 10:35 AM) Zyloprim (100MG  Tablet, Oral) Active. Arimidex (1MG  Tablet, Oral) Active. Aspirin (325MG  Tablet, Oral) Active. Vitamin D3 (1000UNIT Capsule, Oral) Active. Lasix (40MG  Tablet, Oral) Active. Lisinopril (20MG  Tablet, Oral) Active. Ultracet (37.5-325MG  Tablet, Oral) Active. Protonix (40MG  Packet, Oral) Active. Medications  Reconciled    Vitals (Sabrina Canty CMA; 11/12/2018 10:36 AM) 11/12/2018 10:35 AM Weight: 221.5 lb Height: 65in Body Surface Area: 2.07 m Body Mass Index: 36.86 kg/m  Temp.: 97.62F(Temporal)  Pulse: 92 (Regular)  BP: 160/90 (Sitting, Left Arm, Standard)      Physical Exam (Zaivion Kundrat A. Rodriguez Aguinaldo MD; 11/12/2018 10:48 AM)  General Mental Status-Alert. General Appearance-Consistent with stated age. Hydration-Well hydrated. Voice-Normal.  Integumentary Note: Right upper arm over the triceps to 1.2 cm pedunculated ulcerated lesion. No signs of infection.  Eye Eyeball - Bilateral-Extraocular movements intact. Sclera/Conjunctiva - Bilateral-No scleral icterus.  Neurologic Note: Walks with a walker due to weakness  Musculoskeletal Normal Exam - Left-Upper Extremity Strength Normal and Lower Extremity Strength Normal. Normal Exam - Right-Upper Extremity Strength Normal, Lower Extremity Weakness.    Assessment & Plan (Masayo Fera A. Monnica Saltsman MD; 11/12/2018 10:49 AM)  MASS OF SKIN OF RIGHT UPPER EXTREMITY (R22.31) Impression: Recommend wide excision the upper rim to exclude malignancy. Risks, benefits and other treatment options discussed with the patient. She would like to proceed as soon as possible  Current Plans Pt Education - [facility] Free Text Education/Instructions: discussed with patient and provided information. The pathophysiology of skin & subcutaneous masses was discussed. Natural history risks without surgery were discussed. I recommended surgery to remove the mass. I explained the technique of removal with use of local anesthesia & possible need for more aggressive sedation/anesthesia for patient comfort.  Risks such as bleeding, infection, wound breakdown, heart attack, death, and other risks were discussed. I noted a good likelihood this will help address the problem. Possibility that this will not correct all symptoms was explained. Possibility  of regrowth/recurrence of the mass was discussed. We will work to minimize  complications. Questions were answered. The patient expresses understanding & wishes to proceed with surgery.

## 2018-12-13 DIAGNOSIS — R2231 Localized swelling, mass and lump, right upper limb: Secondary | ICD-10-CM | POA: Diagnosis not present

## 2018-12-19 ENCOUNTER — Encounter (HOSPITAL_BASED_OUTPATIENT_CLINIC_OR_DEPARTMENT_OTHER): Payer: Self-pay | Admitting: *Deleted

## 2018-12-19 ENCOUNTER — Other Ambulatory Visit: Payer: Self-pay

## 2018-12-20 ENCOUNTER — Encounter (HOSPITAL_BASED_OUTPATIENT_CLINIC_OR_DEPARTMENT_OTHER)
Admission: RE | Admit: 2018-12-20 | Discharge: 2018-12-20 | Disposition: A | Discharge status: Home / Self Care | Payer: Medicare Other | Source: Ambulatory Visit | Attending: Surgery | Admitting: Surgery

## 2018-12-20 DIAGNOSIS — I491 Atrial premature depolarization: Secondary | ICD-10-CM | POA: Insufficient documentation

## 2018-12-20 DIAGNOSIS — Z01818 Encounter for other preprocedural examination: Secondary | ICD-10-CM | POA: Diagnosis not present

## 2018-12-20 DIAGNOSIS — Z1159 Encounter for screening for other viral diseases: Secondary | ICD-10-CM | POA: Insufficient documentation

## 2018-12-20 LAB — BASIC METABOLIC PANEL
Anion gap: 12 (ref 5–15)
BUN: 49 mg/dL — ABNORMAL HIGH (ref 8–23)
CO2: 26 mmol/L (ref 22–32)
Calcium: 9.4 mg/dL (ref 8.9–10.3)
Chloride: 104 mmol/L (ref 98–111)
Creatinine, Ser: 1.67 mg/dL — ABNORMAL HIGH (ref 0.44–1.00)
GFR calc Af Amer: 32 mL/min — ABNORMAL LOW (ref >60)
GFR calc non Af Amer: 27 mL/min — ABNORMAL LOW (ref >60)
Glucose, Bld: 113 mg/dL — ABNORMAL HIGH (ref 70–99)
Potassium: 4.3 mmol/L (ref 3.5–5.1)
Sodium: 142 mmol/L (ref 135–145)

## 2018-12-22 ENCOUNTER — Other Ambulatory Visit (HOSPITAL_COMMUNITY)
Admission: RE | Admit: 2018-12-22 | Discharge: 2018-12-22 | Disposition: A | Discharge status: Home / Self Care | Payer: Medicare Other | Source: Ambulatory Visit | Attending: Surgery | Admitting: Surgery

## 2018-12-22 DIAGNOSIS — Z01818 Encounter for other preprocedural examination: Secondary | ICD-10-CM | POA: Diagnosis not present

## 2018-12-22 DIAGNOSIS — Z1159 Encounter for screening for other viral diseases: Secondary | ICD-10-CM | POA: Diagnosis not present

## 2018-12-22 DIAGNOSIS — I491 Atrial premature depolarization: Secondary | ICD-10-CM | POA: Diagnosis not present

## 2018-12-22 LAB — SARS CORONAVIRUS 2 (TAT 6-24 HRS): SARS Coronavirus 2: NEGATIVE

## 2018-12-24 ENCOUNTER — Ambulatory Visit: Payer: Medicare Other | Admitting: Internal Medicine

## 2018-12-26 ENCOUNTER — Ambulatory Visit (HOSPITAL_BASED_OUTPATIENT_CLINIC_OR_DEPARTMENT_OTHER): Payer: Medicare Other | Admitting: Certified Registered Nurse Anesthetist

## 2018-12-26 ENCOUNTER — Other Ambulatory Visit: Payer: Self-pay

## 2018-12-26 ENCOUNTER — Ambulatory Visit (HOSPITAL_BASED_OUTPATIENT_CLINIC_OR_DEPARTMENT_OTHER)
Admission: RE | Admit: 2018-12-26 | Discharge: 2018-12-26 | Disposition: A | Discharge status: Home / Self Care | Payer: Medicare Other | Attending: Surgery | Admitting: Surgery

## 2018-12-26 ENCOUNTER — Encounter (HOSPITAL_BASED_OUTPATIENT_CLINIC_OR_DEPARTMENT_OTHER)
Admission: RE | Disposition: A | Discharge status: Home / Self Care | Payer: Self-pay | Source: Home / Self Care | Attending: Surgery

## 2018-12-26 ENCOUNTER — Encounter (HOSPITAL_BASED_OUTPATIENT_CLINIC_OR_DEPARTMENT_OTHER): Payer: Self-pay

## 2018-12-26 DIAGNOSIS — M1990 Unspecified osteoarthritis, unspecified site: Secondary | ICD-10-CM | POA: Diagnosis not present

## 2018-12-26 DIAGNOSIS — Z86718 Personal history of other venous thrombosis and embolism: Secondary | ICD-10-CM | POA: Insufficient documentation

## 2018-12-26 DIAGNOSIS — C44622 Squamous cell carcinoma of skin of right upper limb, including shoulder: Secondary | ICD-10-CM | POA: Insufficient documentation

## 2018-12-26 DIAGNOSIS — K449 Diaphragmatic hernia without obstruction or gangrene: Secondary | ICD-10-CM | POA: Insufficient documentation

## 2018-12-26 DIAGNOSIS — I129 Hypertensive chronic kidney disease with stage 1 through stage 4 chronic kidney disease, or unspecified chronic kidney disease: Secondary | ICD-10-CM | POA: Diagnosis not present

## 2018-12-26 DIAGNOSIS — C44602 Unspecified malignant neoplasm of skin of right upper limb, including shoulder: Secondary | ICD-10-CM | POA: Diagnosis not present

## 2018-12-26 DIAGNOSIS — Z888 Allergy status to other drugs, medicaments and biological substances status: Secondary | ICD-10-CM | POA: Diagnosis not present

## 2018-12-26 DIAGNOSIS — Z882 Allergy status to sulfonamides status: Secondary | ICD-10-CM | POA: Diagnosis not present

## 2018-12-26 DIAGNOSIS — E1122 Type 2 diabetes mellitus with diabetic chronic kidney disease: Secondary | ICD-10-CM | POA: Diagnosis not present

## 2018-12-26 DIAGNOSIS — K219 Gastro-esophageal reflux disease without esophagitis: Secondary | ICD-10-CM | POA: Diagnosis not present

## 2018-12-26 DIAGNOSIS — I10 Essential (primary) hypertension: Secondary | ICD-10-CM | POA: Insufficient documentation

## 2018-12-26 DIAGNOSIS — Z885 Allergy status to narcotic agent status: Secondary | ICD-10-CM | POA: Diagnosis not present

## 2018-12-26 DIAGNOSIS — R229 Localized swelling, mass and lump, unspecified: Secondary | ICD-10-CM | POA: Diagnosis present

## 2018-12-26 DIAGNOSIS — L988 Other specified disorders of the skin and subcutaneous tissue: Secondary | ICD-10-CM | POA: Diagnosis not present

## 2018-12-26 DIAGNOSIS — I4891 Unspecified atrial fibrillation: Secondary | ICD-10-CM | POA: Diagnosis not present

## 2018-12-26 DIAGNOSIS — Z79899 Other long term (current) drug therapy: Secondary | ICD-10-CM | POA: Diagnosis not present

## 2018-12-26 DIAGNOSIS — N183 Chronic kidney disease, stage 3 (moderate): Secondary | ICD-10-CM | POA: Diagnosis not present

## 2018-12-26 DIAGNOSIS — I491 Atrial premature depolarization: Secondary | ICD-10-CM | POA: Insufficient documentation

## 2018-12-26 HISTORY — PX: MASS EXCISION: SHX2000

## 2018-12-26 SURGERY — EXCISION MASS
Anesthesia: Monitor Anesthesia Care | Site: Arm Upper | Laterality: Right

## 2018-12-26 MED ORDER — FENTANYL CITRATE (PF) 100 MCG/2ML IJ SOLN: 50.0000 ug | INTRAMUSCULAR | Status: DC | PRN

## 2018-12-26 MED ORDER — CHLORHEXIDINE GLUCONATE CLOTH 2 % EX PADS: 6.0000 | MEDICATED_PAD | Freq: Once | CUTANEOUS | Status: DC

## 2018-12-26 MED ORDER — BUPIVACAINE-EPINEPHRINE 0.25% -1:200000 IJ SOLN
INTRAMUSCULAR | Status: DC | PRN
  Administered 2018-12-26: 20 mL

## 2018-12-26 MED ORDER — PROPOFOL 500 MG/50ML IV EMUL
INTRAVENOUS | Status: DC | PRN
  Administered 2018-12-26: 75 ug/kg/min via INTRAVENOUS

## 2018-12-26 MED ORDER — FENTANYL CITRATE (PF) 100 MCG/2ML IJ SOLN
INTRAMUSCULAR | Status: DC | PRN
  Administered 2018-12-26: 25 ug via INTRAVENOUS

## 2018-12-26 MED ORDER — FENTANYL CITRATE (PF) 100 MCG/2ML IJ SOLN
INTRAMUSCULAR | Status: AC
  Filled 2018-12-26: qty 2

## 2018-12-26 MED ORDER — CEFAZOLIN SODIUM-DEXTROSE 2-4 GM/100ML-% IV SOLN
INTRAVENOUS | Status: AC
  Filled 2018-12-26: qty 100

## 2018-12-26 MED ORDER — BUPIVACAINE-EPINEPHRINE (PF) 0.25% -1:200000 IJ SOLN
INTRAMUSCULAR | Status: AC
  Filled 2018-12-26: qty 30

## 2018-12-26 MED ORDER — LIDOCAINE 2% (20 MG/ML) 5 ML SYRINGE
INTRAMUSCULAR | Status: DC | PRN
  Administered 2018-12-26: 60 mg via INTRAVENOUS

## 2018-12-26 MED ORDER — LACTATED RINGERS IV SOLN
INTRAVENOUS | Status: DC
  Administered 2018-12-26: 08:00:00 via INTRAVENOUS

## 2018-12-26 MED ORDER — MIDAZOLAM HCL 2 MG/2ML IJ SOLN: 1.0000 mg | INTRAMUSCULAR | Status: DC | PRN

## 2018-12-26 MED ORDER — PROPOFOL 10 MG/ML IV BOLUS
INTRAVENOUS | Status: DC | PRN
  Administered 2018-12-26: 20 mg via INTRAVENOUS

## 2018-12-26 MED ORDER — LIDOCAINE 2% (20 MG/ML) 5 ML SYRINGE
INTRAMUSCULAR | Status: AC
  Filled 2018-12-26: qty 5

## 2018-12-26 MED ORDER — CEFAZOLIN SODIUM-DEXTROSE 2-4 GM/100ML-% IV SOLN
2.0000 g | INTRAVENOUS | Status: AC
  Administered 2018-12-26: 2 g via INTRAVENOUS

## 2018-12-26 MED ORDER — BUPIVACAINE HCL (PF) 0.25 % IJ SOLN
INTRAMUSCULAR | Status: AC
  Filled 2018-12-26: qty 30

## 2018-12-26 MED ORDER — ONDANSETRON HCL 4 MG/2ML IJ SOLN
INTRAMUSCULAR | Status: DC | PRN
  Administered 2018-12-26: 4 mg via INTRAVENOUS

## 2018-12-26 MED ORDER — ONDANSETRON HCL 4 MG/2ML IJ SOLN
INTRAMUSCULAR | Status: AC
  Filled 2018-12-26: qty 2

## 2018-12-26 MED ORDER — PROPOFOL 500 MG/50ML IV EMUL
INTRAVENOUS | Status: AC
  Filled 2018-12-26: qty 50

## 2018-12-26 SURGICAL SUPPLY — 39 items
BANDAGE ELASTIC 4 VELCRO ST LF (GAUZE/BANDAGES/DRESSINGS) IMPLANT
BENZOIN TINCTURE PRP APPL 2/3 (GAUZE/BANDAGES/DRESSINGS) IMPLANT
BLADE SURG 10 STRL SS (BLADE) IMPLANT
BLADE SURG 15 STRL LF DISP TIS (BLADE) ×1 IMPLANT
BLADE SURG 15 STRL SS (BLADE) ×1
CANISTER SUCT 1200ML W/VALVE (MISCELLANEOUS) IMPLANT
CHLORAPREP W/TINT 26 (MISCELLANEOUS) ×2 IMPLANT
COVER BACK TABLE REUSABLE LG (DRAPES) ×2 IMPLANT
COVER MAYO STAND REUSABLE (DRAPES) ×2 IMPLANT
COVER WAND RF STERILE (DRAPES) IMPLANT
DECANTER SPIKE VIAL GLASS SM (MISCELLANEOUS) IMPLANT
DERMABOND ADVANCED (GAUZE/BANDAGES/DRESSINGS) ×1
DERMABOND ADVANCED .7 DNX12 (GAUZE/BANDAGES/DRESSINGS) ×1 IMPLANT
DRAPE LAPAROTOMY 100X72 PEDS (DRAPES) ×2 IMPLANT
DRAPE UTILITY XL STRL (DRAPES) ×2 IMPLANT
DRSG TEGADERM 4X10 (GAUZE/BANDAGES/DRESSINGS) IMPLANT
ELECT COATED BLADE 2.86 ST (ELECTRODE) ×2 IMPLANT
ELECT REM PT RETURN 9FT ADLT (ELECTROSURGICAL) ×2
ELECTRODE REM PT RTRN 9FT ADLT (ELECTROSURGICAL) ×1 IMPLANT
GLOVE BIOGEL PI IND STRL 8 (GLOVE) ×1 IMPLANT
GLOVE BIOGEL PI INDICATOR 8 (GLOVE) ×1
GLOVE ECLIPSE 8.0 STRL XLNG CF (GLOVE) ×2 IMPLANT
GOWN STRL REUS W/ TWL LRG LVL3 (GOWN DISPOSABLE) ×2 IMPLANT
GOWN STRL REUS W/TWL LRG LVL3 (GOWN DISPOSABLE) ×2
NEEDLE HYPO 25X1 1.5 SAFETY (NEEDLE) ×2 IMPLANT
NS IRRIG 1000ML POUR BTL (IV SOLUTION) ×2 IMPLANT
PACK BASIN DAY SURGERY FS (CUSTOM PROCEDURE TRAY) ×2 IMPLANT
PENCIL BUTTON HOLSTER BLD 10FT (ELECTRODE) ×2 IMPLANT
SLEEVE SCD COMPRESS KNEE MED (MISCELLANEOUS) ×2 IMPLANT
SPONGE LAP 4X18 RFD (DISPOSABLE) IMPLANT
STAPLER VISISTAT 35W (STAPLE) IMPLANT
STRIP CLOSURE SKIN 1/2X4 (GAUZE/BANDAGES/DRESSINGS) IMPLANT
SUT MON AB 4-0 PC3 18 (SUTURE) ×2 IMPLANT
SUT VICRYL 3-0 CR8 SH (SUTURE) ×2 IMPLANT
SUT VICRYL AB 3 0 TIES (SUTURE) IMPLANT
SYR CONTROL 10ML LL (SYRINGE) ×2 IMPLANT
TOWEL GREEN STERILE FF (TOWEL DISPOSABLE) ×4 IMPLANT
TUBE CONNECTING 20X1/4 (TUBING) IMPLANT
YANKAUER SUCT BULB TIP NO VENT (SUCTIONS) IMPLANT

## 2018-12-26 NOTE — Discharge Instructions (Signed)
GENERAL SURGERY: POST OP INSTRUCTIONS  ######################################################################  EAT Gradually transition to a high fiber diet with a fiber supplement over the next few weeks after discharge.  Start with a pureed / full liquid diet (see below)  WALK Walk an hour a day.  Control your pain to do that.    CONTROL PAIN Control pain so that you can walk, sleep, tolerate sneezing/coughing, go up/down stairs.  HAVE A BOWEL MOVEMENT DAILY Keep your bowels regular to avoid problems.  OK to try a laxative to override constipation.  OK to use an antidairrheal to slow down diarrhea.  Call if not better after 2 tries  CALL IF YOU HAVE PROBLEMS/CONCERNS Call if you are still struggling despite following these instructions. Call if you have concerns not answered by these instructions  ######################################################################    1. DIET: Follow a light bland diet & liquids the first 24 hours after arrival home, such as soup, liquids, starches, etc.  Be sure to drink plenty of fluids.  Quickly advance to a usual solid diet within a few days.  Avoid fast food or heavy meals as your are more likely to get nauseated or have irregular bowels.  A low-fat, high-fiber diet for the rest of your life is ideal.   2. Take your usually prescribed home medications unless otherwise directed. 3. PAIN CONTROL: a. Pain is best controlled by a usual combination of three different methods TOGETHER: i. Ice/Heat ii. Over the counter pain medication iii. Prescription pain medication b. Most patients will experience some swelling and bruising around the incisions.  Ice packs or heating pads (30-60 minutes up to 6 times a day) will help. Use ice for the first few days to help decrease swelling and bruising, then switch to heat to help relax tight/sore spots and speed recovery.  Some people prefer to use ice alone, heat alone, alternating between ice & heat.   Experiment to what works for you.  Swelling and bruising can take several weeks to resolve.   c. It is helpful to take an over-the-counter pain medication regularly for the first few weeks.  Choose one of the following that works best for you: i. Naproxen (Aleve, etc)  Two 220mg tabs twice a day ii. Ibuprofen (Advil, etc) Three 200mg tabs four times a day (every meal & bedtime) iii. Acetaminophen (Tylenol, etc) 500-650mg four times a day (every meal & bedtime) d. A  prescription for pain medication (such as oxycodone, hydrocodone, etc) should be given to you upon discharge.  Take your pain medication as prescribed.  i. If you are having problems/concerns with the prescription medicine (does not control pain, nausea, vomiting, rash, itching, etc), please call us [phone] to see if we need to switch you to a different pain medicine that will work better for you and/or control your side effect better. ii. If you need a refill on your pain medication, please contact your pharmacy.  They will contact our office to request authorization. Prescriptions will not be filled after 5 pm or on week-ends. 4. Avoid getting constipated.  Between the surgery and the pain medications, it is common to experience some constipation.  Increasing fluid intake and taking a fiber supplement (such as Metamucil, Citrucel, FiberCon, MiraLax, etc) 1-2 times a day regularly will usually help prevent this problem from occurring.  A mild laxative (prune juice, Milk of Magnesia, MiraLax, etc) should be taken according to package directions if there are no bowel movements after 48 hours.   5. Wash /   shower every day.  You may shower over the dressings as they are waterproof.  Continue to shower over incision(s) after the dressing is off. 6. Remove your waterproof bandages 5 days after surgery.  You may leave the incision open to air.  You may have skin tapes (Steri Strips) covering the incision(s).  Leave them on until one week, then  remove.  You may replace a dressing/Band-Aid to cover the incision for comfort if you wish.      7. ACTIVITIES as tolerated:   a. You may resume regular (light) daily activities beginning the next day--such as daily self-care, walking, climbing stairs--gradually increasing activities as tolerated.  If you can walk 30 minutes without difficulty, it is safe to try more intense activity such as jogging, treadmill, bicycling, low-impact aerobics, swimming, etc. b. Save the most intensive and strenuous activity for last such as sit-ups, heavy lifting, contact sports, etc  Refrain from any heavy lifting or straining until you are off narcotics for pain control.   c. DO NOT PUSH THROUGH PAIN.  Let pain be your guide: If it hurts to do something, don't do it.  Pain is your body warning you to avoid that activity for another week until the pain goes down. d. You may drive when you are no longer taking prescription pain medication, you can comfortably wear a seatbelt, and you can safely maneuver your car and apply brakes. e. You may have sexual intercourse when it is comfortable.  8. FOLLOW UP in our office a. Please call [facility] at [phone] to set up an appointment to see your surgeon in the office for a follow-up appointment approximately 2-3 weeks after your surgery. b. Make sure that you call for this appointment the day you arrive home to insure a convenient appointment time. 9. IF YOU HAVE DISABILITY OR FAMILY LEAVE FORMS, BRING THEM TO THE OFFICE FOR PROCESSING.  DO NOT GIVE THEM TO YOUR DOCTOR.   WHEN TO CALL US [phone]: 1. Poor pain control 2. Reactions / problems with new medications (rash/itching, nausea, etc)  3. Fever over 101.5 F (38.5 C) 4. Worsening swelling or bruising 5. Continued bleeding from incision. 6. Increased pain, redness, or drainage from the incision 7. Difficulty breathing / swallowing   The clinic staff is available to answer your questions during regular  business hours (8:30am-5pm).  Please don't hesitate to call and ask to speak to one of our nurses for clinical concerns.   If you have a medical emergency, go to the nearest emergency room or call 911.  A surgeon from [facility] is always on call at the hospitals   [facility] [address], [address], [place], [place]  [address] ? MAIN: [phone] ? TOLL FREE: [phone] ?  FAX [phone] [email or web address]    Post Anesthesia Home Care Instructions  Activity: Get plenty of rest for the remainder of the day. A responsible individual must stay with you for 24 hours following the procedure.  For the next 24 hours, DO NOT: -Drive a car -Operate machinery -Drink alcoholic beverages -Take any medication unless instructed by your physician -Make any legal decisions or sign important papers.  Meals: Start with liquid foods such as gelatin or soup. Progress to regular foods as tolerated. Avoid greasy, spicy, heavy foods. If nausea and/or vomiting occur, drink only clear liquids until the nausea and/or vomiting subsides. Call your physician if vomiting continues.  Special Instructions/Symptoms: Your throat may feel dry or   sore from the anesthesia or the breathing tube placed in your throat during surgery. If this causes discomfort, gargle with warm salt water. The discomfort should disappear within 24 hours.  If you had a scopolamine patch placed behind your ear for the management of post- operative nausea and/or vomiting:  1. The medication in the patch is effective for 72 hours, after which it should be removed.  Wrap patch in a tissue and discard in the trash. Wash hands thoroughly with soap and water. 2. You may remove the patch earlier than 72 hours if you experience unpleasant side effects which may include dry mouth, dizziness or visual disturbances. 3. Avoid touching the patch. Wash your hands with soap and water after  contact with the patch.     

## 2018-12-26 NOTE — Transfer of Care (Signed)
Immediate Anesthesia Transfer of Care Note  Patient: [name]  Procedure(s) Performed: EXCISION RIGHT ARM SKIN MASS (Right Arm Upper)  Patient Location: PACU  Anesthesia Type:MAC  Level of Consciousness: awake, alert  and oriented  Airway & Oxygen Therapy: Patient Spontanous Breathing and Patient connected to face mask oxygen  Post-op Assessment: Report given to RN and Post -op Vital signs reviewed and stable  Post vital signs: Reviewed and stable  Last Vitals:  Vitals Value Taken Time  BP 122/66 12/26/18 0908  Temp    Pulse 74 12/26/18 0910  Resp 22 12/26/18 0910  SpO2 100 % 12/26/18 0910  Vitals shown include unvalidated device data.  Last Pain:  Vitals:   12/26/18 0716  TempSrc: Oral  PainSc:          Complications: No apparent anesthesia complications

## 2018-12-26 NOTE — Anesthesia Postprocedure Evaluation (Signed)
Anesthesia Post Note  Patient: [name]  Procedure(s) Performed: EXCISION RIGHT ARM SKIN MASS (Right Arm Upper)     Patient location during evaluation: PACU Anesthesia Type: MAC Level of consciousness: awake and alert Pain management: pain level controlled Vital Signs Assessment: post-procedure vital signs reviewed and stable Respiratory status: spontaneous breathing, nonlabored ventilation and respiratory function stable Cardiovascular status: blood pressure returned to baseline and stable Postop Assessment: no apparent nausea or vomiting Anesthetic complications: no    Last Vitals:  Vitals:   12/26/18 0927 12/26/18 0945  BP: (!) 151/68 (!) 152/55  Pulse: 78 77  Resp: 18 18  Temp:  36.6 C  SpO2: 94% 96%    Last Pain:  Vitals:   12/26/18 0945  TempSrc:   PainSc: 0-No pain                 Lidia Collum

## 2018-12-26 NOTE — Op Note (Signed)
Preoperative diagnosis: 5.5 cm x 3 cm fungating skin lesion right posterior arm  Postoperative diagnosis: Same  Procedure: Wide excision of right arm skin lesion  Surgeon: Erroll Luna, MD  MAC with 0.25% Sensorcaine local with epinephrine  EBL: Minimal  Specimen skin lesion as stated above  Indications for procedure the patient is an 86 year old female with a fungating bleeding skin lesion on the posterior aspect of her right upper arm.  She presents today for excision.  Risk, benefits and other options of treatment were discussed with the patient.  Given the fact that it is fungating and bleeding excision is recommended The procedure has been discussed with the patient.  Alternative therapies have been discussed with the patient.  Operative risks include bleeding,  Infection,  Organ injury,  Nerve injury,  Blood vessel injury,  DVT,  Pulmonary embolism,  Death,  And possible reoperation.  Medical management risks include worsening of present situation.  The success of the procedure is 50 -90 % at treating patients symptoms.  The patient understands and agrees to proceed.  Description of procedure: The patient was met in the holding area and the areas marked.  Questions were answered to my best ability.  She was taken back the operating.  She is placed supine upon the OR table.  After MAC anesthesia the right arm was draped across her chest.  It was appropriately supported.  It was appropriately padded.  The skin lesion overlying her posterior right upper arm over her triceps muscle.  This was prepped and draped in a sterile fashion timeout was done and she received appropriate preoperative antibiotics.  An elliptical incision was made after infiltration of local anesthetic around the lesion and the entire lesion was excised to a size of 5.5 cm x 4 cm.  This was taken down to the subcutaneous fat.  It was then closed with interrupted 2-0 Vicryl and 4-0 Monocryl.  Dermabond applied.  The procedure  was tolerated well with the patient she was then awoke taken to PACU in satisfactory condition.  All final counts were found to be correct.Marland Kitchen

## 2018-12-26 NOTE — H&P (Signed)
[name] Documented: 11/12/2018 10:35 AM Location: [facility] Patient #: [id number] DOB: [birth year] Married / Language: English / Race: White Female  History of Present Illness (Twanna Resh A. Allayah Raineri MD; 11/12/2018 10:48 AM) Patient words: he patient is a 85 year old female who presents with a skin neoplasm. She is status post right sentinel lymph node biopsy and lumpectomy for lobular carcinoma of the right upper outer quadrant on 06/29/11 by Dr. Hassell Done. She saw Dr. Hassell Done on 11/01/26 which time she had a 1.2 cm mass on the posterior aspect of her right arm. She states that this area started as a mole a few months ago. Dr. Hassell Done believed it could be cancer or exuberant growth/hyper-granulation tissue. He recommended excision in the operating room, but she presented on friday to urgent office with concerns of possible infection. She states it has been draining more over the past few days. Denies much pain. She is not on blood thinners but takes aspirin 325 mg every other day for history of stroke several years ago.   She states the area is sore and draining. She was seen Friday 10 years in office and comes in today to discuss surgical excision is Dr. Hassell Done is out of the office.  The patient is a 85 year old female.   Allergies (Sabrina Canty, CMA; 11/12/2018 10:35 AM) Atorvastatin Calcium *CHEMICALS* OxyCODONE HCl (Abuse Deter) *ANALGESICS - OPIOID* Simvastatin *ANTIHYPERLIPIDEMICS* Codeine Phosphate *ANALGESICS - OPIOID* Sulfacetamide *CHEMICALS* Allergies Reconciled  Medication History (Sabrina Canty, CMA; 11/12/2018 10:35 AM) Zyloprim (100MG  Tablet, Oral) Active. Arimidex (1MG  Tablet, Oral) Active. Aspirin (325MG  Tablet, Oral) Active. Vitamin D3 (1000UNIT Capsule, Oral) Active. Lasix (40MG  Tablet, Oral) Active. Lisinopril (20MG  Tablet, Oral) Active. Ultracet (37.5-325MG  Tablet, Oral) Active. Protonix (40MG  Packet, Oral) Active. Medications  Reconciled    Vitals (Sabrina Canty CMA; 11/12/2018 10:36 AM) 11/12/2018 10:35 AM Weight: 221.5 lb Height: 65in Body Surface Area: 2.07 m Body Mass Index: 36.86 kg/m  Temp.: 97.17F(Temporal)  Pulse: 92 (Regular)  BP: 160/90 (Sitting, Left Arm, Standard)      Physical Exam (Mandrell Vangilder A. Zaharah Amir MD; 11/12/2018 10:48 AM)  General Mental Status-Alert. General Appearance-Consistent with stated age. Hydration-Well hydrated. Voice-Normal.  Integumentary Note: Right upper arm over the triceps to 1.2 cm pedunculated ulcerated lesion. No signs of infection.  Eye Eyeball - Bilateral-Extraocular movements intact. Sclera/Conjunctiva - Bilateral-No scleral icterus.  Neurologic Note: Walks with a walker due to weakness  Musculoskeletal Normal Exam - Left-Upper Extremity Strength Normal and Lower Extremity Strength Normal. Normal Exam - Right-Upper Extremity Strength Normal, Lower Extremity Weakness.    Assessment & Plan (Christy Friede A. Jahiem Franzoni MD; 11/12/2018 10:49 AM)  MASS OF SKIN OF RIGHT UPPER EXTREMITY (R22.31) Impression: Recommend wide excision the upper rim to exclude malignancy. Risks, benefits and other treatment options discussed with the patient. She would like to proceed as soon as possible  Current Plans Pt Education - [facility] Free Text Education/Instructions: discussed with patient and provided information. The pathophysiology of skin & subcutaneous masses was discussed. Natural history risks without surgery were discussed. I recommended surgery to remove the mass. I explained the technique of removal with use of local anesthesia & possible need for more aggressive sedation/anesthesia for patient comfort.  Risks such as bleeding, infection, wound breakdown, heart attack, death, and other risks were discussed. I noted a good likelihood this will help address the problem. Possibility that this will not correct all symptoms was  explained. Possibility of regrowth/recurrence of the mass was discussed. We will work to minimize  complications. Questions were answered. The patient expresses understanding & wishes to proceed with surgery.

## 2018-12-26 NOTE — Interval H&P Note (Signed)
History and Physical Interval Note:  12/26/2018 8:05 AM  [name]  has presented today for surgery, with the diagnosis of RIGHT ARM SKIN MASS.  The various methods of treatment have been discussed with the patient and family. After consideration of risks, benefits and other options for treatment, the patient has consented to  Procedure(s): EXCISION RIGHT ARM SKIN MASS (Right) as a surgical intervention.  The patient's history has been reviewed, patient examined, no change in status, stable for surgery.  I have reviewed the patient's chart and labs.  Questions were answered to the patient's satisfaction.     [place]

## 2018-12-26 NOTE — Anesthesia Preprocedure Evaluation (Addendum)
Anesthesia Evaluation  Patient identified by MRN, date of birth, ID band Patient awake    Reviewed: Allergy & Precautions, NPO status , Patient's Chart, lab work & pertinent test results  History of Anesthesia Complications (+) PONVNegative for: history of anesthetic complications  Airway Mallampati: II  TM Distance: >3 FB Neck ROM: Full    Dental  (+) Upper Dentures, Lower Dentures   Pulmonary neg pulmonary ROS,    Pulmonary exam normal        Cardiovascular hypertension, Pt. on medications + DVT  Normal cardiovascular exam+ dysrhythmias Atrial Fibrillation      Neuro/Psych CVA (2002) negative psych ROS   GI/Hepatic Neg liver ROS, hiatal hernia, GERD  ,  Endo/Other  diabetesHypothyroidism   Renal/GU Renal InsufficiencyRenal disease  negative genitourinary   Musculoskeletal  (+) Arthritis ,   Abdominal   Peds  Hematology negative hematology ROS (+)   Anesthesia Other Findings Echo 2015: EF 55-60%, grade 2 dd, no significant valve abnormality  Reproductive/Obstetrics                            Anesthesia Physical Anesthesia Plan  ASA: III  Anesthesia Plan: MAC   Post-op Pain Management:    Induction:   PONV Risk Score and Plan: 3 and Propofol infusion and Treatment may vary due to age or medical condition  Airway Management Planned: Nasal Cannula and Simple Face Mask  Additional Equipment: None  Intra-op Plan:   Post-operative Plan:   Informed Consent: I have reviewed the patients History and Physical, chart, labs and discussed the procedure including the risks, benefits and alternatives for the proposed anesthesia with the patient or authorized representative who has indicated his/her understanding and acceptance.       Plan Discussed with:   Anesthesia Plan Comments:        Anesthesia Quick Evaluation

## 2018-12-27 ENCOUNTER — Encounter (HOSPITAL_BASED_OUTPATIENT_CLINIC_OR_DEPARTMENT_OTHER): Payer: Self-pay | Admitting: Surgery

## 2019-01-14 MED FILL — FUROSEMIDE 40 MG TAB: 40 | 90 days supply | Qty: 90 | Fill #2

## 2019-01-14 MED FILL — AMLODIPINE BESYLATE 5 MG TA: 5 | 90 days supply | Qty: 90 | Fill #2

## 2019-01-14 MED FILL — ALLOPURINOL 100 MG TABS: 100 | 45 days supply | Qty: 135 | Fill #2

## 2019-01-14 MED FILL — LISINOPRIL 20 MG TABLET: 20 | 90 days supply | Qty: 180 | Fill #2

## 2019-01-15 ENCOUNTER — Ambulatory Visit (INDEPENDENT_AMBULATORY_CARE_PROVIDER_SITE_OTHER): Payer: Medicare Other | Admitting: Internal Medicine

## 2019-01-15 ENCOUNTER — Encounter: Payer: Self-pay | Admitting: Internal Medicine

## 2019-01-15 ENCOUNTER — Other Ambulatory Visit: Payer: Self-pay

## 2019-01-15 DIAGNOSIS — I10 Essential (primary) hypertension: Secondary | ICD-10-CM

## 2019-01-15 DIAGNOSIS — L03116 Cellulitis of left lower limb: Secondary | ICD-10-CM | POA: Insufficient documentation

## 2019-01-15 DIAGNOSIS — E119 Type 2 diabetes mellitus without complications: Secondary | ICD-10-CM

## 2019-01-15 MED ORDER — DOXYCYCLINE HYCLATE 100 MG PO TABS: 100.0000 mg | ORAL_TABLET | Freq: Two times a day (BID) | ORAL | 0 refills | Status: DC

## 2019-01-15 NOTE — Assessment & Plan Note (Signed)
Mild to mod, for antibx course,  to f/u any worsening symptoms or concerns 

## 2019-01-15 NOTE — Progress Notes (Signed)
Subjective:    Patient ID: [name], female    DOB: Feb 17, 1930, 85 y.o.   MRN: [id number]  HPI  Here to f/u after an unfortunate slip in the yard and struck a hard object to the left mid shin resulting in bout 1 cm area skin loss and tearing, then now with 1 days onset worsening red, swelling, tender, without fever, red streaks, or chills or drainage. Pt denies chest pain, increased sob or doe, wheezing, orthopnea, PND, increased LE swelling, palpitations, dizziness or syncope.  Pt denies new neurological symptoms such as new headache, or facial or extremity weakness or numbness   Pt denies polydipsia, polyuria Past Medical History:  Diagnosis Date  . Anemia   . Anxiety   . Atrial flutter ([place])    "sometimes"  . Blood transfusion   . Breast cancer ([place])    right breast  . Bronchitis   . Chronic kidney disease    occ uti's  . CKD (chronic kidney disease) stage 3, GFR 30-59 ml/min ([name])   . Colon polyps    hyperplastic  . CVA (cerebral infarction)   . Diverticulosis   . DVT of leg (deep venous thrombosis) ([name])    left; S/P OR  . Dysrhythmia    dr bensimhon   . Esophageal stricture   . GERD (gastroesophageal reflux disease)   . Gout   . Hiatal hernia    4 cm  . History of radiation therapy 02/15/12-04/02/12   right breast 4680 cGy/26 sessions, right boost=1400cGy /7 sessions  . Hyperlipidemia   . Hypertension   . IBS (irritable bowel syndrome)   . Kidney stones 1990's  . Morbid obesity ([place])   . Osteoarthritis   . Personal history of radiation therapy   . Pneumonia    "walking"  . PONV (postoperative nausea and vomiting)   . Shingles 02/21/2011  . Shortness of breath on exertion   . Skin cancer   . Stroke [facility]) 2002   residual:  "little tingling in palm of left hand"  . UTI (lower urinary tract infection)    "I've had a few"   Past Surgical History:  Procedure Laterality Date  . ABDOMINAL HYSTERECTOMY    . BILE DUCT STENT PLACEMENT  ~ 01/2011  . BREAST  LUMPECTOMY  06/29/11   right  . BREAST LUMPECTOMY  06/29/2011   Procedure: BREAST LUMPECTOMY WITH EXCISION OF SENTINEL NODE;  Surgeon: Pedro Earls, MD;  Location: [place];  Service: General;  Laterality: Right;  right sentinel node mapping,right sentinel node biopsy, needle localization right breast lumpectomy  . CATARACT EXTRACTION W/ INTRAOCULAR LENS  IMPLANT, BILATERAL  1990's  . CHOLECYSTECTOMY  03/23/11   lap chole   . COLON SURGERY     hole in intestine ,tumors?  . colonic perforation repair  ~ 2000  . [place]  2012  . INCISION AND DRAINAGE ABSCESS N/A 08/12/2013   Procedure: INCISION AND DRAINAGE ABSCESS;  Surgeon: Earnstine Regal, MD;  Location: [facility];  Service: General;  Laterality: N/A;  . INCISION AND DRAINAGE ABSCESS N/A 08/20/2013   Procedure: INCISION AND DRAINAGE ABSCESS;  Surgeon: Ralene Ok, MD;  Location: [facility];  Service: General;  Laterality: N/A;  . INCISION AND DRAINAGE BREAST ABSCESS     right breast seroma  . IRRIGATION AND DEBRIDEMENT ABSCESS N/A 08/14/2013   Procedure: Debridment of perineal tissue and debridement of perineal wound;  Surgeon: Earnstine Regal, MD;  Location: [facility];  Service: General;  Laterality: N/A;  . IRRIGATION AND DEBRIDEMENT ABSCESS N/A 08/16/2013   Procedure: DRESSING CHANGE AND DEBRIDEMENT OF PERINEUM WOUND;  Surgeon: Earnstine Regal, MD;  Location: [facility];  Service: General;  Laterality: N/A;  . JOINT REPLACEMENT    . MASS EXCISION Right 12/26/2018   Procedure: EXCISION RIGHT ARM SKIN MASS;  Surgeon: Erroll Luna, MD;  Location: [place];  Service: General;  Laterality: Right;  . MINOR APPLICATION OF WOUND VAC N/A 08/20/2013   Procedure: MINOR APPLICATION OF WOUND VAC;  Surgeon: Ralene Ok, MD;  Location: [facility];  Service: General;  Laterality: N/A;  . pelvic bone tumor removal     twice in 2000's  . PILONIDAL CYST DRAINAGE N/A 08/19/2013   Procedure: Dressing change perineum;  Surgeon: Ralene Ok, MD;   Location: [facility];  Service: General;  Laterality: N/A;  . REPLACEMENT TOTAL KNEE BILATERAL  ~ 2008; ~ 2010   right; left  . TONSILLECTOMY  ~ 1946  . TOTAL HIP ARTHROPLASTY  1980's   right    reports that she has never smoked. She has never used smokeless tobacco. She reports that she does not drink alcohol or use drugs. family history includes Breast cancer in her sister; Diabetes in her sister; Heart disease in her maternal uncle and paternal uncle; Kidney failure in her sister; Prostate cancer in her brother; Stroke in her mother. Allergies  Allergen Reactions  . Ace Inhibitors Other (See Comments)    Tolerates lisinopril at home. Pt does not recall allergy.  . Atorvastatin     REACTION: myalgias  . Oxycodone-Acetaminophen     REACTION: confusion, fatigue  . Rosuvastatin     REACTION: leg weakness  . Simvastatin     REACTION: leg weakness  . Codeine Nausea And Vomiting and Rash  . Sulfonamide Derivatives Hives and Rash   Current Outpatient Medications on File Prior to Visit  Medication Sig Dispense Refill  . acetaminophen (TYLENOL) 325 MG tablet Take 650 mg by mouth every 6 (six) hours as needed.    Marland Kitchen allopurinol (ZYLOPRIM) 100 MG tablet TAKE 2 TABLETS BY MOUTH EVERY OTHER DAY AND 1 TABLET EVERY OTHER DAY 135 tablet 3  . amLODipine (NORVASC) 5 MG tablet TAKE 1 TABLET BY MOUTH ONCE DAILY 90 tablet 2  . anastrozole (ARIMIDEX) 1 MG tablet Take 1 tablet (1 mg total) by mouth daily. 90 tablet 4  . aspirin 325 MG EC tablet Take 1 tablet (325 mg total) by mouth every other day.    . cholecalciferol (VITAMIN D) 1000 UNITS tablet Take 1,000 Units by mouth every morning.     . ezetimibe (ZETIA) 10 MG tablet Take 1 tablet (10 mg total) by mouth daily. 90 tablet 3  . furosemide (LASIX) 40 MG tablet TAKE 1 TABLET BY MOUTH EVERY MORNING. 90 tablet 3  . levothyroxine (SYNTHROID, LEVOTHROID) 25 MCG tablet Take 1 tablet (25 mcg total) by mouth daily before breakfast. 90 tablet 3  . lisinopril  (PRINIVIL,ZESTRIL) 20 MG tablet TAKE 2 TABLETS BY MOUTH ONCE DAILY 180 tablet 3  . nitrofurantoin (MACRODANTIN) 50 MG capsule Take 1 capsule (50 mg total) by mouth 2 (two) times daily. 20 capsule 0  . pantoprazole (PROTONIX) 40 MG tablet Take 1 tablet (40 mg total) by mouth daily. 30 tablet 0  . predniSONE (DELTASONE) 10 MG tablet 2 tabs by mouth per day for 5 days 10 tablet 0  . traMADol-acetaminophen (ULTRACET) 37.5-325 MG tablet TAKE ONE TABLET BY MOUTH EVERY 6 HOURS  AS NEEDED 120 tablet 2   No current facility-administered medications on file prior to visit.    Review of Systems  Constitutional: Negative for other unusual diaphoresis or sweats HENT: Negative for ear discharge or swelling Eyes: Negative for other worsening visual disturbances Respiratory: Negative for stridor or other swelling  Gastrointestinal: Negative for worsening distension or other blood Genitourinary: Negative for retention or other urinary change Musculoskeletal: Negative for other MSK pain or swelling Skin: Negative for color change or other new lesions Neurological: Negative for worsening tremors and other numbness  Psychiatric/Behavioral: Negative for worsening agitation or other fatigue All other system neg per pt    Objective:   Physical Exam BP (!) 150/62   Pulse 88   Temp 98.1 F (36.7 C) (Oral)   Resp 19   Ht 5\' 5"  (1.651 m)   Wt 220 lb (99.8 kg)   SpO2 93%   BMI 36.61 kg/m  VS noted,  Constitutional: Pt appears in NAD HENT: Head: NCAT.  Right Ear: External ear normal.  Left Ear: External ear normal.  Eyes: . Pupils are equal, round, and reactive to light. Conjunctivae and EOM are normal Nose: without d/c or deformity Neck: Neck supple. Gross normal ROM Cardiovascular: Normal rate and regular rhythm.   Pulmonary/Chest: Effort normal and breath sounds without rales or wheezing.  Neurological: Pt is alert. At baseline orientation, motor grossly intact Skin: Skin is warm. No rashes, no LE  edema but left mid anterior shin/leg with 1 cm area very shallow skin loss with skin tear, and 1 -2 cm are surrounding red, tender, swelling without drainage or red streaks Psychiatric: Pt behavior is normal without agitation  No other exam findings Lab Results  Component Value Date   WBC 10.8 (H) 06/25/2018   HGB 13.7 06/25/2018   HCT 42.7 06/25/2018   PLT 243.0 06/25/2018   GLUCOSE 113 (H) 12/20/2018   CHOL 203 (H) 06/25/2018   TRIG 195.0 (H) 06/25/2018   HDL 39.20 06/25/2018   LDLDIRECT 150.0 06/19/2017   LDLCALC 124 (H) 06/25/2018   ALT 11 06/25/2018   AST 16 06/25/2018   NA 142 12/20/2018   K 4.3 12/20/2018   CL 104 12/20/2018   CREATININE 1.67 (H) 12/20/2018   BUN 49 (H) 12/20/2018   CO2 26 12/20/2018   TSH 8.00 (H) 06/25/2018   INR 1.05 06/18/2014   HGBA1C 7.1 (H) 06/25/2018   MICROALBUR 3.7 (H) 06/25/2018       Assessment & Plan:

## 2019-01-15 NOTE — Assessment & Plan Note (Signed)
stable overall by history and exam, recent data reviewed with pt, and pt to continue medical treatment as before,  to f/u any worsening symptoms or concerns  

## 2019-01-15 NOTE — Patient Instructions (Signed)
Please take all new medication as prescribed - the antibiotic especially if getting worse  Please continue all other medications as before, and refills have been done if requested.  Please have the pharmacy call with any other refills you may need.  Please continue your efforts at being more active, low cholesterol diet, and weight control.  Please keep your appointments with your specialists as you may have planned

## 2019-01-16 DIAGNOSIS — L97522 Non-pressure chronic ulcer of other part of left foot with fat layer exposed: Secondary | ICD-10-CM | POA: Diagnosis not present

## 2019-01-24 ENCOUNTER — Other Ambulatory Visit: Payer: Self-pay

## 2019-01-24 ENCOUNTER — Ambulatory Visit
Admission: RE | Admit: 2019-01-24 | Discharge: 2019-01-24 | Disposition: A | Discharge status: Home / Self Care | Payer: Medicare Other | Source: Ambulatory Visit | Attending: Surgery | Admitting: Surgery

## 2019-01-24 DIAGNOSIS — Z853 Personal history of malignant neoplasm of breast: Secondary | ICD-10-CM

## 2019-01-24 DIAGNOSIS — R928 Other abnormal and inconclusive findings on diagnostic imaging of breast: Secondary | ICD-10-CM | POA: Diagnosis not present

## 2019-01-28 ENCOUNTER — Ambulatory Visit: Payer: Medicare Other | Admitting: Internal Medicine

## 2019-02-05 DIAGNOSIS — B9562 Methicillin resistant Staphylococcus aureus infection as the cause of diseases classified elsewhere: Secondary | ICD-10-CM | POA: Diagnosis not present

## 2019-02-05 DIAGNOSIS — Z1629 Resistance to other single specified antibiotic: Secondary | ICD-10-CM | POA: Diagnosis not present

## 2019-02-05 DIAGNOSIS — B957 Other staphylococcus as the cause of diseases classified elsewhere: Secondary | ICD-10-CM | POA: Diagnosis not present

## 2019-02-05 DIAGNOSIS — L97529 Non-pressure chronic ulcer of other part of left foot with unspecified severity: Secondary | ICD-10-CM | POA: Diagnosis not present

## 2019-02-05 DIAGNOSIS — Z1611 Resistance to penicillins: Secondary | ICD-10-CM | POA: Diagnosis not present

## 2019-02-05 DIAGNOSIS — L97522 Non-pressure chronic ulcer of other part of left foot with fat layer exposed: Secondary | ICD-10-CM | POA: Diagnosis not present

## 2019-02-05 MED FILL — CEPHALEXIN 500 MG CAPSULE: 500 | 10 days supply | Qty: 40 | Fill #0

## 2019-02-21 DIAGNOSIS — L97522 Non-pressure chronic ulcer of other part of left foot with fat layer exposed: Secondary | ICD-10-CM | POA: Diagnosis not present

## 2019-03-07 DIAGNOSIS — L97521 Non-pressure chronic ulcer of other part of left foot limited to breakdown of skin: Secondary | ICD-10-CM | POA: Diagnosis not present

## 2019-03-26 ENCOUNTER — Other Ambulatory Visit: Payer: Self-pay

## 2019-03-26 ENCOUNTER — Ambulatory Visit (INDEPENDENT_AMBULATORY_CARE_PROVIDER_SITE_OTHER): Payer: Medicare Other | Admitting: Internal Medicine

## 2019-03-26 ENCOUNTER — Encounter: Payer: Self-pay | Admitting: Internal Medicine

## 2019-03-26 ENCOUNTER — Other Ambulatory Visit (INDEPENDENT_AMBULATORY_CARE_PROVIDER_SITE_OTHER): Payer: Medicare Other

## 2019-03-26 VITALS — BP 136/84 | HR 79 | Temp 97.9°F | Ht 65.0 in | Wt 221.0 lb

## 2019-03-26 DIAGNOSIS — E785 Hyperlipidemia, unspecified: Secondary | ICD-10-CM

## 2019-03-26 DIAGNOSIS — N1831 Chronic kidney disease, stage 3a: Secondary | ICD-10-CM

## 2019-03-26 DIAGNOSIS — R7989 Other specified abnormal findings of blood chemistry: Secondary | ICD-10-CM

## 2019-03-26 DIAGNOSIS — L989 Disorder of the skin and subcutaneous tissue, unspecified: Secondary | ICD-10-CM | POA: Insufficient documentation

## 2019-03-26 DIAGNOSIS — I10 Essential (primary) hypertension: Secondary | ICD-10-CM

## 2019-03-26 DIAGNOSIS — E119 Type 2 diabetes mellitus without complications: Secondary | ICD-10-CM

## 2019-03-26 LAB — BASIC METABOLIC PANEL
BUN: 35 mg/dL — ABNORMAL HIGH (ref 6–23)
CO2: 31 mEq/L (ref 19–32)
Calcium: 10.2 mg/dL (ref 8.4–10.5)
Chloride: 102 mEq/L (ref 96–112)
Creatinine, Ser: 1.56 mg/dL — ABNORMAL HIGH (ref 0.40–1.20)
GFR: 31.39 mL/min — ABNORMAL LOW (ref >60.00)
Glucose, Bld: 127 mg/dL — ABNORMAL HIGH (ref 70–99)
Potassium: 4.3 mEq/L (ref 3.5–5.1)
Sodium: 143 mEq/L (ref 135–145)

## 2019-03-26 LAB — LDL CHOLESTEROL, DIRECT: Direct LDL: 129 mg/dL

## 2019-03-26 LAB — LIPID PANEL
Cholesterol: 191 mg/dL (ref 0–200)
HDL: 36 mg/dL — ABNORMAL LOW (ref >39.00)
NonHDL: 155.28
Total CHOL/HDL Ratio: 5
Triglycerides: 232 mg/dL — ABNORMAL HIGH (ref 0.0–149.0)
VLDL: 46.4 mg/dL — ABNORMAL HIGH (ref 0.0–40.0)

## 2019-03-26 LAB — HEPATIC FUNCTION PANEL
ALT: 9 U/L (ref 0–35)
AST: 16 U/L (ref 0–37)
Albumin: 3.8 g/dL (ref 3.5–5.2)
Alkaline Phosphatase: 90 U/L (ref 39–117)
Bilirubin, Direct: 0.1 mg/dL (ref 0.0–0.3)
Total Bilirubin: 0.6 mg/dL (ref 0.2–1.2)
Total Protein: 7.6 g/dL (ref 6.0–8.3)

## 2019-03-26 LAB — HEMOGLOBIN A1C: Hgb A1c MFr Bld: 7.1 % — ABNORMAL HIGH (ref 4.6–6.5)

## 2019-03-26 LAB — TSH: TSH: 4.98 u[IU]/mL — ABNORMAL HIGH (ref 0.35–4.50)

## 2019-03-26 MED ORDER — SANTYL 250 UNIT/GM EX OINT: 1.0000 "application " | TOPICAL_OINTMENT | Freq: Every day | CUTANEOUS | 1 refills | Status: DC

## 2019-03-26 NOTE — Patient Instructions (Signed)

## 2019-03-26 NOTE — Progress Notes (Signed)
Subjective:    Patient ID: [name], female    DOB: 01-11-1930, 85 y.o.   MRN: [id number]  HPI  Here to f/u; overall doing ok,  Pt denies chest pain, increasing sob or doe, wheezing, orthopnea, PND, increased LE swelling, palpitations, dizziness or syncope.  Pt denies new neurological symptoms such as new headache, or facial or extremity weakness or numbness.  Pt denies polydipsia, polyuria, or low sugar episode.  Pt states overall good compliance with meds, mostly trying to follow appropriate diet, with wt overall stable,  but little exercise however.  Left leg cellulitis not returned.  Denies hyper or hypo thyroid symptoms such as voice, skin or hair change.  No new complaints Wt Readings from Last 3 Encounters:  03/26/19 221 lb (100.2 kg)  01/15/19 220 lb (99.8 kg)  12/26/18 217 lb 2.5 oz (98.5 kg)   BP Readings from Last 3 Encounters:  03/26/19 136/84  01/15/19 (!) 150/62  12/26/18 (!) 152/55   Past Medical History:  Diagnosis Date  . Anemia   . Anxiety   . Atrial flutter ([place])    "sometimes"  . Blood transfusion   . Breast cancer ([place])    right breast  . Bronchitis   . Chronic kidney disease    occ uti's  . CKD (chronic kidney disease) stage 3, GFR 30-59 ml/min   . Colon polyps    hyperplastic  . CVA (cerebral infarction)   . Diverticulosis   . DVT of leg (deep venous thrombosis) ([name])    left; S/P OR  . Dysrhythmia    dr bensimhon   . Esophageal stricture   . GERD (gastroesophageal reflux disease)   . Gout   . Hiatal hernia    4 cm  . History of radiation therapy 02/15/12-04/02/12   right breast 4680 cGy/26 sessions, right boost=1400cGy /7 sessions  . Hyperlipidemia   . Hypertension   . IBS (irritable bowel syndrome)   . Kidney stones 1990's  . Morbid obesity ([place])   . Osteoarthritis   . Personal history of radiation therapy   . Pneumonia    "walking"  . PONV (postoperative nausea and vomiting)   . Shingles 02/21/2011  . Shortness of breath on exertion    . Skin cancer   . Stroke [facility]) 2002   residual:  "little tingling in palm of left hand"  . UTI (lower urinary tract infection)    "I've had a few"   Past Surgical History:  Procedure Laterality Date  . ABDOMINAL HYSTERECTOMY    . BILE DUCT STENT PLACEMENT  ~ 01/2011  . BREAST LUMPECTOMY  06/29/11   right  . BREAST LUMPECTOMY  06/29/2011   Procedure: BREAST LUMPECTOMY WITH EXCISION OF SENTINEL NODE;  Surgeon: Pedro Earls, MD;  Location: [facility];  Service: General;  Laterality: Right;  right sentinel node mapping,right sentinel node biopsy, needle localization right breast lumpectomy  . CATARACT EXTRACTION W/ INTRAOCULAR LENS  IMPLANT, BILATERAL  1990's  . CHOLECYSTECTOMY  03/23/11   lap chole   . COLON SURGERY     hole in intestine ,tumors?  . colonic perforation repair  ~ 2000  . [place]  2012  . INCISION AND DRAINAGE ABSCESS N/A 08/12/2013   Procedure: INCISION AND DRAINAGE ABSCESS;  Surgeon: Earnstine Regal, MD;  [facility];  Service: General;  Laterality: N/A;  . INCISION AND DRAINAGE ABSCESS N/A 08/20/2013   Procedure: INCISION AND DRAINAGE ABSCESS;  Surgeon: Ralene Ok, MD;  Location: [facility];  Service: General;  Laterality: N/A;  . INCISION AND DRAINAGE BREAST ABSCESS     right breast seroma  . IRRIGATION AND DEBRIDEMENT ABSCESS N/A 08/14/2013   Procedure: Debridment of perineal tissue and debridement of perineal wound;  Surgeon: Earnstine Regal, MD;  [facility];  Service: General;  Laterality: N/A;  . IRRIGATION AND DEBRIDEMENT ABSCESS N/A 08/16/2013   Procedure: DRESSING CHANGE AND DEBRIDEMENT OF PERINEUM WOUND;  Surgeon: Earnstine Regal, MD;  [facility];  Service: General;  Laterality: N/A;  . JOINT REPLACEMENT    . MASS EXCISION Right 12/26/2018   Procedure: EXCISION RIGHT ARM SKIN MASS;  Surgeon: Erroll Luna, MD;  Location: [facility];  Service: General;  Laterality: Right;  . MINOR APPLICATION OF WOUND VAC N/A 08/20/2013    Procedure: MINOR APPLICATION OF WOUND VAC;  Surgeon: Ralene Ok, MD;  [facility];  Service: General;  Laterality: N/A;  . pelvic bone tumor removal     twice in 2000's  . PILONIDAL CYST DRAINAGE N/A 08/19/2013   Procedure: Dressing change perineum;  Surgeon: Ralene Ok, MD;  [facility];  Service: General;  Laterality: N/A;  . REPLACEMENT TOTAL KNEE BILATERAL  ~ 2008; ~ 2010   right; left  . TONSILLECTOMY  ~ 1946  . TOTAL HIP ARTHROPLASTY  1980's   right    reports that she has never smoked. She has never used smokeless tobacco. She reports that she does not drink alcohol or use drugs. family history includes Breast cancer in her sister; Diabetes in her sister; Heart disease in her maternal uncle and paternal uncle; Kidney failure in her sister; Prostate cancer in her brother; Stroke in her mother. Allergies  Allergen Reactions  . Ace Inhibitors Other (See Comments)    Tolerates lisinopril at home. Pt does not recall allergy.  . Atorvastatin     REACTION: myalgias  . Oxycodone-Acetaminophen     REACTION: confusion, fatigue  . Rosuvastatin     REACTION: leg weakness  . Simvastatin     REACTION: leg weakness  . Codeine Nausea And Vomiting and Rash  . Sulfonamide Derivatives Hives and Rash   Current Outpatient Medications on File Prior to Visit  Medication Sig Dispense Refill  . acetaminophen (TYLENOL) 325 MG tablet Take 650 mg by mouth every 6 (six) hours as needed.    Marland Kitchen allopurinol (ZYLOPRIM) 100 MG tablet TAKE 2 TABLETS BY MOUTH EVERY OTHER DAY AND 1 TABLET EVERY OTHER DAY 135 tablet 3  . amLODipine (NORVASC) 5 MG tablet TAKE 1 TABLET BY MOUTH ONCE DAILY 90 tablet 2  . anastrozole (ARIMIDEX) 1 MG tablet Take 1 tablet (1 mg total) by mouth daily. 90 tablet 4  . aspirin 325 MG EC tablet Take 1 tablet (325 mg total) by mouth every other day.    . cholecalciferol (VITAMIN D) 1000 UNITS tablet Take 1,000 Units by mouth every morning.     Marland Kitchen doxycycline (VIBRA-TABS)  100 MG tablet Take 1 tablet (100 mg total) by mouth 2 (two) times daily. 20 tablet 0  . ezetimibe (ZETIA) 10 MG tablet Take 1 tablet (10 mg total) by mouth daily. 90 tablet 3  . furosemide (LASIX) 40 MG tablet TAKE 1 TABLET BY MOUTH EVERY MORNING. 90 tablet 3  . levothyroxine (SYNTHROID, LEVOTHROID) 25 MCG tablet Take 1 tablet (25 mcg total) by mouth daily before breakfast. 90 tablet 3  . lisinopril (PRINIVIL,ZESTRIL) 20 MG tablet TAKE 2 TABLETS BY MOUTH ONCE DAILY 180  tablet 3  . nitrofurantoin (MACRODANTIN) 50 MG capsule Take 1 capsule (50 mg total) by mouth 2 (two) times daily. 20 capsule 0  . pantoprazole (PROTONIX) 40 MG tablet Take 1 tablet (40 mg total) by mouth daily. 30 tablet 0  . predniSONE (DELTASONE) 10 MG tablet 2 tabs by mouth per day for 5 days 10 tablet 0  . traMADol-acetaminophen (ULTRACET) 37.5-325 MG tablet TAKE ONE TABLET BY MOUTH EVERY 6 HOURS AS NEEDED 120 tablet 2   No current facility-administered medications on file prior to visit.    Review of Systems  Constitutional: Negative for other unusual diaphoresis or sweats HENT: Negative for ear discharge or swelling Eyes: Negative for other worsening visual disturbances Respiratory: Negative for stridor or other swelling  Gastrointestinal: Negative for worsening distension or other blood Genitourinary: Negative for retention or other urinary change Musculoskeletal: Negative for other MSK pain or swelling Skin: Negative for color change or other new lesions Neurological: Negative for worsening tremors and other numbness  Psychiatric/Behavioral: Negative for worsening agitation or other fatigue All otherwise neg per pt    Objective:   Physical Exam BP 136/84   Pulse 79   Temp 97.9 F (36.6 C) (Oral)   Ht 5\' 5"  (1.651 m)   Wt 221 lb (100.2 kg)   SpO2 95%   BMI 36.78 kg/m  VS noted,  Constitutional: Pt appears in NAD HENT: Head: NCAT.  Right Ear: External ear normal.  Left Ear: External ear normal.  Eyes: .  Pupils are equal, round, and reactive to light. Conjunctivae and EOM are normal Nose: without d/c or deformity Neck: Neck supple. Gross normal ROM Cardiovascular: Normal rate and regular rhythm.   Pulmonary/Chest: Effort normal and breath sounds without rales or wheezing.  Abd:  Soft, NT, ND, + BS, no organomegaly Neurological: Pt is alert. At baseline orientation, motor grossly intact Skin: Skin is warm. No rashes, other new lesions, no LE edema Psychiatric: Pt behavior is normal without agitation  All otherwise neg per pt Lab Results  Component Value Date   WBC 10.8 (H) 06/25/2018   HGB 13.7 06/25/2018   HCT 42.7 06/25/2018   PLT 243.0 06/25/2018   GLUCOSE 113 (H) 12/20/2018   CHOL 203 (H) 06/25/2018   TRIG 195.0 (H) 06/25/2018   HDL 39.20 06/25/2018   LDLDIRECT 150.0 06/19/2017   LDLCALC 124 (H) 06/25/2018   ALT 11 06/25/2018   AST 16 06/25/2018   NA 142 12/20/2018   K 4.3 12/20/2018   CL 104 12/20/2018   CREATININE 1.67 (H) 12/20/2018   BUN 49 (H) 12/20/2018   CO2 26 12/20/2018   TSH 8.00 (H) 06/25/2018   INR 1.05 06/18/2014   HGBA1C 7.1 (H) 06/25/2018   MICROALBUR 3.7 (H) 06/25/2018      Assessment & Plan:

## 2019-03-27 ENCOUNTER — Encounter: Payer: Self-pay | Admitting: Internal Medicine

## 2019-03-30 ENCOUNTER — Encounter: Payer: Self-pay | Admitting: Internal Medicine

## 2019-03-30 NOTE — Assessment & Plan Note (Signed)
For f/u lab,  Asympt, to f/u any worsening symptoms or concerns

## 2019-03-30 NOTE — Assessment & Plan Note (Signed)
stable overall by history and exam, recent data reviewed with pt, and pt to continue medical treatment as before,  to f/u any worsening symptoms or concerns  

## 2019-04-16 ENCOUNTER — Encounter: Payer: Self-pay | Admitting: Internal Medicine

## 2019-04-16 ENCOUNTER — Ambulatory Visit (INDEPENDENT_AMBULATORY_CARE_PROVIDER_SITE_OTHER): Payer: Medicare Other | Admitting: Internal Medicine

## 2019-04-16 ENCOUNTER — Other Ambulatory Visit: Payer: Self-pay

## 2019-04-16 DIAGNOSIS — I10 Essential (primary) hypertension: Secondary | ICD-10-CM

## 2019-04-16 DIAGNOSIS — M726 Necrotizing fasciitis: Secondary | ICD-10-CM | POA: Diagnosis not present

## 2019-04-16 DIAGNOSIS — N9489 Other specified conditions associated with female genital organs and menstrual cycle: Secondary | ICD-10-CM | POA: Diagnosis not present

## 2019-04-16 DIAGNOSIS — E119 Type 2 diabetes mellitus without complications: Secondary | ICD-10-CM

## 2019-04-16 DIAGNOSIS — N899 Noninflammatory disorder of vagina, unspecified: Secondary | ICD-10-CM | POA: Insufficient documentation

## 2019-04-16 MED ORDER — TRAMADOL-ACETAMINOPHEN 37.5-325 MG PO TABS: 1.0000 | ORAL_TABLET | Freq: Four times a day (QID) | ORAL | 2 refills | Status: DC | PRN

## 2019-04-16 MED ORDER — DOXYCYCLINE HYCLATE 100 MG PO TABS: 100.0000 mg | ORAL_TABLET | Freq: Two times a day (BID) | ORAL | 0 refills | Status: DC

## 2019-04-16 MED FILL — DOXYCYCLINE HYCLATE 100 MG: 100 | 10 days supply | Qty: 20 | Fill #0

## 2019-04-16 NOTE — Assessment & Plan Note (Signed)
stable overall by history and exam, recent data reviewed with pt, and pt to continue medical treatment as before,  to f/u any worsening symptoms or concerns  

## 2019-04-16 NOTE — Addendum Note (Signed)
Addended by: Biagio Borg on: 04/16/2019 01:57 PM   Modules accepted: Orders

## 2019-04-16 NOTE — Progress Notes (Signed)
Subjective:    Patient ID: [name], female    DOB: 21-Jun-1929, 85 y.o.   MRN: [id number]  HPI  Here with less than 1 wk onset gradually worsening vaginal /perineal darkening with swelling and pain, maybe somewhat similar to the necortizing fasciitis of the perineum.  Pt called Dr Hassell Done but deferred by a nurse to here, and pt states she does not have a gyn.  Fortunately no fever ,a dn Denies urinary symptoms such as dysuria, frequency, urgency, flank pain, hematuria or n/v, fever, chills.  Here to f/u; overall doing ok,  Pt denies chest pain, increasing sob or doe, wheezing, orthopnea, PND, increased LE swelling, palpitations, dizziness or syncope.  Pt denies new neurological symptoms such as new headache, or facial or extremity weakness or numbness.  Pt denies polydipsia, polyuria,.  Does not really want exam today, needs to see surgury Past Medical History:  Diagnosis Date  . Anemia   . Anxiety   . Atrial flutter ([place])    "sometimes"  . Blood transfusion   . Breast cancer ([place])    right breast  . Bronchitis   . Chronic kidney disease    occ uti's  . CKD (chronic kidney disease) stage 3, GFR 30-59 ml/min   . Colon polyps    hyperplastic  . CVA (cerebral infarction)   . Diverticulosis   . DVT of leg (deep venous thrombosis) ([name])    left; S/P OR  . Dysrhythmia    dr bensimhon   . Esophageal stricture   . GERD (gastroesophageal reflux disease)   . Gout   . Hiatal hernia    4 cm  . History of radiation therapy 02/15/12-04/02/12   right breast 4680 cGy/26 sessions, right boost=1400cGy /7 sessions  . Hyperlipidemia   . Hypertension   . IBS (irritable bowel syndrome)   . Kidney stones 1990's  . Morbid obesity ([place])   . Osteoarthritis   . Personal history of radiation therapy   . Pneumonia    "walking"  . PONV (postoperative nausea and vomiting)   . Shingles 02/21/2011  . Shortness of breath on exertion   . Skin cancer   . Stroke [facility]) 2002   residual:  "little tingling  in palm of left hand"  . UTI (lower urinary tract infection)    "I've had a few"   Past Surgical History:  Procedure Laterality Date  . ABDOMINAL HYSTERECTOMY    . BILE DUCT STENT PLACEMENT  ~ 01/2011  . BREAST LUMPECTOMY  06/29/11   right  . BREAST LUMPECTOMY  06/29/2011   Procedure: BREAST LUMPECTOMY WITH EXCISION OF SENTINEL NODE;  Surgeon: Pedro Earls, MD;  Location: [facility];  Service: General;  Laterality: Right;  right sentinel node mapping,right sentinel node biopsy, needle localization right breast lumpectomy  . CATARACT EXTRACTION W/ INTRAOCULAR LENS  IMPLANT, BILATERAL  1990's  . CHOLECYSTECTOMY  03/23/11   lap chole   . COLON SURGERY     hole in intestine ,tumors?  . colonic perforation repair  ~ 2000  . [place]  2012  . INCISION AND DRAINAGE ABSCESS N/A 08/12/2013   Procedure: INCISION AND DRAINAGE ABSCESS;  Surgeon: Earnstine Regal, MD;  [facility];  Service: General;  Laterality: N/A;  . INCISION AND DRAINAGE ABSCESS N/A 08/20/2013   Procedure: INCISION AND DRAINAGE ABSCESS;  Surgeon: Ralene Ok, MD;  [facility];  Service: General;  Laterality: N/A;  . INCISION AND DRAINAGE BREAST ABSCESS  right breast seroma  . IRRIGATION AND DEBRIDEMENT ABSCESS N/A 08/14/2013   Procedure: Debridment of perineal tissue and debridement of perineal wound;  Surgeon: Earnstine Regal, MD;  [facility];  Service: General;  Laterality: N/A;  . IRRIGATION AND DEBRIDEMENT ABSCESS N/A 08/16/2013   Procedure: DRESSING CHANGE AND DEBRIDEMENT OF PERINEUM WOUND;  Surgeon: Earnstine Regal, MD;  [facility];  Service: General;  Laterality: N/A;  . JOINT REPLACEMENT    . MASS EXCISION Right 12/26/2018   Procedure: EXCISION RIGHT ARM SKIN MASS;  Surgeon: Erroll Luna, MD;  Location: [facility];  Service: General;  Laterality: Right;  . MINOR APPLICATION OF WOUND VAC N/A 08/20/2013   Procedure: MINOR APPLICATION OF WOUND VAC;  Surgeon: Ralene Ok, MD;   [facility];  Service: General;  Laterality: N/A;  . pelvic bone tumor removal     twice in 2000's  . PILONIDAL CYST DRAINAGE N/A 08/19/2013   Procedure: Dressing change perineum;  Surgeon: Ralene Ok, MD;  [facility];  Service: General;  Laterality: N/A;  . REPLACEMENT TOTAL KNEE BILATERAL  ~ 2008; ~ 2010   right; left  . TONSILLECTOMY  ~ 1946  . TOTAL HIP ARTHROPLASTY  1980's   right    reports that she has never smoked. She has never used smokeless tobacco. She reports that she does not drink alcohol or use drugs. family history includes Breast cancer in her sister; Diabetes in her sister; Heart disease in her maternal uncle and paternal uncle; Kidney failure in her sister; Prostate cancer in her brother; Stroke in her mother. Allergies  Allergen Reactions  . Ace Inhibitors Other (See Comments)    Tolerates lisinopril at home. Pt does not recall allergy.  . Atorvastatin     REACTION: myalgias  . Oxycodone-Acetaminophen     REACTION: confusion, fatigue  . Rosuvastatin     REACTION: leg weakness  . Simvastatin     REACTION: leg weakness  . Codeine Nausea And Vomiting and Rash  . Sulfonamide Derivatives Hives and Rash   Current Outpatient Medications on File Prior to Visit  Medication Sig Dispense Refill  . acetaminophen (TYLENOL) 325 MG tablet Take 650 mg by mouth every 6 (six) hours as needed.    Marland Kitchen allopurinol (ZYLOPRIM) 100 MG tablet TAKE 2 TABLETS BY MOUTH EVERY OTHER DAY AND 1 TABLET EVERY OTHER DAY 135 tablet 3  . amLODipine (NORVASC) 5 MG tablet TAKE 1 TABLET BY MOUTH ONCE DAILY 90 tablet 2  . anastrozole (ARIMIDEX) 1 MG tablet Take 1 tablet (1 mg total) by mouth daily. 90 tablet 4  . aspirin 325 MG EC tablet Take 1 tablet (325 mg total) by mouth every other day.    . cholecalciferol (VITAMIN D) 1000 UNITS tablet Take 1,000 Units by mouth every morning.     . collagenase (SANTYL) ointment Apply 1 application topically daily. 30 g 1  . doxycycline (VIBRA-TABS)  100 MG tablet Take 1 tablet (100 mg total) by mouth 2 (two) times daily. 20 tablet 0  . ezetimibe (ZETIA) 10 MG tablet Take 1 tablet (10 mg total) by mouth daily. 90 tablet 3  . furosemide (LASIX) 40 MG tablet TAKE 1 TABLET BY MOUTH EVERY MORNING. 90 tablet 3  . levothyroxine (SYNTHROID, LEVOTHROID) 25 MCG tablet Take 1 tablet (25 mcg total) by mouth daily before breakfast. 90 tablet 3  . lisinopril (PRINIVIL,ZESTRIL) 20 MG tablet TAKE 2 TABLETS BY MOUTH ONCE DAILY 180 tablet 3  . nitrofurantoin (  MACRODANTIN) 50 MG capsule Take 1 capsule (50 mg total) by mouth 2 (two) times daily. 20 capsule 0  . pantoprazole (PROTONIX) 40 MG tablet Take 1 tablet (40 mg total) by mouth daily. 30 tablet 0  . predniSONE (DELTASONE) 10 MG tablet 2 tabs by mouth per day for 5 days 10 tablet 0  . traMADol-acetaminophen (ULTRACET) 37.5-325 MG tablet TAKE ONE TABLET BY MOUTH EVERY 6 HOURS AS NEEDED 120 tablet 2   No current facility-administered medications on file prior to visit.    Review of Systems . Constitutional: Negative for other unusual diaphoresis or sweats HENT: Negative for ear discharge or swelling Eyes: Negative for other worsening visual disturbances Respiratory: Negative for stridor or other swelling  Gastrointestinal: Negative for worsening distension or other blood Genitourinary: Negative for retention or other urinary change Musculoskeletal: Negative for other MSK pain or swelling Skin: Negative for color change or other new lesions Neurological: Negative for worsening tremors and other numbness  Psychiatric/Behavioral: Negative for worsening agitation or other fatigue All otherwise neg per pt    Objective:   Physical Exam BP (!) 186/80 (BP Location: Left Arm, Patient Position: Sitting, Cuff Size: Large)   Pulse 91   Temp 97.7 F (36.5 C) (Oral)   Ht 5\' 5"  (1.651 m)   Wt 215 lb (97.5 kg)   SpO2 95%   BMI 35.78 kg/m  VS noted,  Constitutional: Pt appears in NAD HENT: Head: NCAT.   Right Ear: External ear normal.  Left Ear: External ear normal.  Eyes: . Pupils are equal, round, and reactive to light. Conjunctivae and EOM are normal Nose: without d/c or deformity Neck: Neck supple. Gross normal ROM Cardiovascular: Normal rate and regular rhythm.   Pulmonary/Chest: Effort normal and breath sounds without rales or wheezing.  Abd:  Soft, NT, ND, + BS, no organomegaly Neurological: Pt is alert. At baseline orientation, motor grossly intact Skin: Skin is warm, no LE edema Psychiatric: Pt behavior is normal without agitation  All otherwise neg per pt Lab Results  Component Value Date   WBC 10.8 (H) 06/25/2018   HGB 13.7 06/25/2018   HCT 42.7 06/25/2018   PLT 243.0 06/25/2018   GLUCOSE 127 (H) 03/26/2019   CHOL 191 03/26/2019   TRIG 232.0 (H) 03/26/2019   HDL 36.00 (L) 03/26/2019   LDLDIRECT 129.0 03/26/2019   LDLCALC 124 (H) 06/25/2018   ALT 9 03/26/2019   AST 16 03/26/2019   NA 143 03/26/2019   K 4.3 03/26/2019   CL 102 03/26/2019   CREATININE 1.56 (H) 03/26/2019   BUN 35 (H) 03/26/2019   CO2 31 03/26/2019   TSH 4.98 (H) 03/26/2019   INR 1.05 06/18/2014   HGBA1C 7.1 (H) 03/26/2019   MICROALBUR 3.7 (H) 06/25/2018        Assessment & Plan:

## 2019-04-16 NOTE — Assessment & Plan Note (Addendum)
With possible recurrence - for general surgury asap, start empiric doxycycline  Addendum: by report general surgury defers to GYN - will refer

## 2019-04-16 NOTE — Patient Instructions (Addendum)
Ok for referral to GYN  Please take all new medication as prescribed - the antibiotic  Please continue all other medications as before, and refills have been done if requested.  Please have the pharmacy call with any other refills you may need.  Please continue your efforts at being more active, low cholesterol diet, and weight control. . Please keep your appointments with your specialists as you may have planned

## 2019-04-17 MED FILL — FLUCONAZOLE 150 MG TABS: 150 | 7 days supply | Qty: 3 | Fill #0

## 2019-04-18 ENCOUNTER — Other Ambulatory Visit: Payer: Self-pay | Admitting: Obstetrics and Gynecology

## 2019-04-19 ENCOUNTER — Other Ambulatory Visit: Payer: Self-pay | Admitting: Internal Medicine

## 2019-04-19 MED FILL — LISINOPRIL 20 MG TABLET: 20 | 90 days supply | Qty: 180 | Fill #3

## 2019-04-19 MED FILL — ALLOPURINOL 100 MG TABS: 100 | 45 days supply | Qty: 135 | Fill #3

## 2019-04-19 MED FILL — AMLODIPINE BESYLATE 5 MG TA: 5 | 90 days supply | Qty: 90 | Fill #0

## 2019-04-19 MED FILL — FUROSEMIDE 40 MG TAB: 40 | 90 days supply | Qty: 90 | Fill #3

## 2019-04-22 ENCOUNTER — Other Ambulatory Visit: Payer: Self-pay | Admitting: Obstetrics and Gynecology

## 2019-04-23 ENCOUNTER — Other Ambulatory Visit: Payer: Self-pay | Admitting: Obstetrics and Gynecology

## 2019-05-02 ENCOUNTER — Other Ambulatory Visit: Payer: Self-pay | Admitting: Obstetrics and Gynecology

## 2019-05-06 MED FILL — FLUCONAZOLE 150 MG TABS: 150 | 7 days supply | Qty: 3 | Fill #0

## 2019-05-13 ENCOUNTER — Ambulatory Visit (INDEPENDENT_AMBULATORY_CARE_PROVIDER_SITE_OTHER): Payer: Medicare Other | Admitting: Internal Medicine

## 2019-05-13 ENCOUNTER — Encounter: Payer: Self-pay | Admitting: Internal Medicine

## 2019-05-13 ENCOUNTER — Other Ambulatory Visit: Payer: Self-pay

## 2019-05-13 ENCOUNTER — Telehealth: Payer: Self-pay | Admitting: Internal Medicine

## 2019-05-13 ENCOUNTER — Other Ambulatory Visit (INDEPENDENT_AMBULATORY_CARE_PROVIDER_SITE_OTHER): Payer: Medicare Other

## 2019-05-13 VITALS — BP 126/84 | HR 70 | Temp 97.9°F | Ht 65.0 in | Wt 216.0 lb

## 2019-05-13 DIAGNOSIS — N1831 Chronic kidney disease, stage 3a: Secondary | ICD-10-CM | POA: Diagnosis not present

## 2019-05-13 DIAGNOSIS — E119 Type 2 diabetes mellitus without complications: Secondary | ICD-10-CM

## 2019-05-13 DIAGNOSIS — R300 Dysuria: Secondary | ICD-10-CM | POA: Diagnosis not present

## 2019-05-13 DIAGNOSIS — I10 Essential (primary) hypertension: Secondary | ICD-10-CM | POA: Diagnosis not present

## 2019-05-13 LAB — URINALYSIS, ROUTINE W REFLEX MICROSCOPIC
Bilirubin Urine: NEGATIVE
Hgb urine dipstick: NEGATIVE
Ketones, ur: NEGATIVE
Nitrite: NEGATIVE
RBC / HPF: NONE SEEN (ref 0–?)
Specific Gravity, Urine: 1.015 (ref 1.000–1.030)
Total Protein, Urine: NEGATIVE
Urine Glucose: NEGATIVE
Urobilinogen, UA: 0.2 (ref 0.0–1.0)
pH: 5.5 (ref 5.0–8.0)

## 2019-05-13 MED ORDER — CEPHALEXIN 500 MG PO CAPS: 500.0000 mg | ORAL_CAPSULE | Freq: Four times a day (QID) | ORAL | 0 refills | Status: AC

## 2019-05-13 MED ORDER — CEFTRIAXONE SODIUM 1 G IJ SOLR
1.0000 g | Freq: Once | INTRAMUSCULAR | Status: AC
  Administered 2019-05-13: 1 g via INTRAMUSCULAR

## 2019-05-13 MED FILL — CEPHALEXIN 500 MG CAPSULE: 500 | 10 days supply | Qty: 40 | Fill #0

## 2019-05-13 NOTE — Telephone Encounter (Signed)
Copied from [phone]. Topic: General - Other >> May 13, 2019  8:15 AM Keene Breath wrote: Reason for CRM: Patient called to ask the nurse to call her in an antibiotic.  She stated that she has a kidney and is in a lot of pain.  She would like the nurse to call her at [phone]

## 2019-05-13 NOTE — Progress Notes (Signed)
Subjective:    Patient ID: [name], female    DOB: 13-Feb-1930, 85 y.o.   MRN: [id number]  HPI    Here to f/u recurring dysuria; was seen per GYN last wk and rx with a short course of an antibx she cannot recall, but in last 2 days has really come back with moderate to severe dysuria and left flank pains.  Denies urinary symptoms such as frequency, urgency, hematuria or n/v, fever, chills.  Last UTI about 2 yrs ago.  Does have chronic incontinence all day long and has declined surgury in the past.  Pt denies chest pain, increased sob or doe, wheezing, orthopnea, PND, increased LE swelling, palpitations, dizziness or syncope.   Pt denies polydipsia, polyuria, or low sugar symptoms such as weakness or confusion improved with po intake.  Pt states overall good compliance with meds, trying to follow lower cholesterol, diabetic diet, wt overall stable but little exercise however.    Past Medical History:  Diagnosis Date  . Anemia   . Anxiety   . Atrial flutter ([place])    "sometimes"  . Blood transfusion   . Breast cancer ([place])    right breast  . Bronchitis   . Chronic kidney disease    occ uti's  . CKD (chronic kidney disease) stage 3, GFR 30-59 ml/min   . Colon polyps    hyperplastic  . CVA (cerebral infarction)   . Diverticulosis   . DVT of leg (deep venous thrombosis) ([name])    left; S/P OR  . Dysrhythmia    dr bensimhon   . Esophageal stricture   . GERD (gastroesophageal reflux disease)   . Gout   . Hiatal hernia    4 cm  . History of radiation therapy 02/15/12-04/02/12   right breast 4680 cGy/26 sessions, right boost=1400cGy /7 sessions  . Hyperlipidemia   . Hypertension   . IBS (irritable bowel syndrome)   . Kidney stones 1990's  . Morbid obesity ([place])   . Osteoarthritis   . Personal history of radiation therapy   . Pneumonia    "walking"  . PONV (postoperative nausea and vomiting)   . Shingles 02/21/2011  . Shortness of breath on exertion   . Skin cancer   . Stroke  [facility]) 2002   residual:  "little tingling in palm of left hand"  . UTI (lower urinary tract infection)    "I've had a few"   Past Surgical History:  Procedure Laterality Date  . ABDOMINAL HYSTERECTOMY    . BILE DUCT STENT PLACEMENT  ~ 01/2011  . BREAST LUMPECTOMY  06/29/11   right  . BREAST LUMPECTOMY  06/29/2011   Procedure: BREAST LUMPECTOMY WITH EXCISION OF SENTINEL NODE;  Surgeon: Pedro Earls, MD;  Location: [facility] [place];  Service: General;  Laterality: Right;  right sentinel node mapping,right sentinel node biopsy, needle localization right breast lumpectomy  . CATARACT EXTRACTION W/ INTRAOCULAR LENS  IMPLANT, BILATERAL  1990's  . CHOLECYSTECTOMY  03/23/11   lap chole   . COLON SURGERY     hole in intestine ,tumors?  . colonic perforation repair  ~ 2000  . [place]  2012  . INCISION AND DRAINAGE ABSCESS N/A 08/12/2013   Procedure: INCISION AND DRAINAGE ABSCESS;  Surgeon: Earnstine Regal, MD;  [facility];  Service: General;  Laterality: N/A;  . INCISION AND DRAINAGE ABSCESS N/A 08/20/2013   Procedure: INCISION AND DRAINAGE ABSCESS;  Surgeon: Ralene Ok, MD;  [facility];  Service: General;  Laterality: N/A;  . INCISION AND DRAINAGE BREAST ABSCESS     right breast seroma  . IRRIGATION AND DEBRIDEMENT ABSCESS N/A 08/14/2013   Procedure: Debridment of perineal tissue and debridement of perineal wound;  Surgeon: Earnstine Regal, MD;  [facility];  Service: General;  Laterality: N/A;  . IRRIGATION AND DEBRIDEMENT ABSCESS N/A 08/16/2013   Procedure: DRESSING CHANGE AND DEBRIDEMENT OF PERINEUM WOUND;  Surgeon: Earnstine Regal, MD;  [facility];  Service: General;  Laterality: N/A;  . JOINT REPLACEMENT    . MASS EXCISION Right 12/26/2018   Procedure: EXCISION RIGHT ARM SKIN MASS;  Surgeon: Erroll Luna, MD;  Location: [place];  Service: General;  Laterality: Right;  . MINOR APPLICATION OF WOUND VAC N/A 08/20/2013   Procedure: MINOR APPLICATION OF  WOUND VAC;  Surgeon: Ralene Ok, MD;  [facility];  Service: General;  Laterality: N/A;  . pelvic bone tumor removal     twice in 2000's  . PILONIDAL CYST DRAINAGE N/A 08/19/2013   Procedure: Dressing change perineum;  Surgeon: Ralene Ok, MD;  [facility];  Service: General;  Laterality: N/A;  . REPLACEMENT TOTAL KNEE BILATERAL  ~ 2008; ~ 2010   right; left  . TONSILLECTOMY  ~ 1946  . TOTAL HIP ARTHROPLASTY  1980's   right    reports that she has never smoked. She has never used smokeless tobacco. She reports that she does not drink alcohol or use drugs. family history includes Breast cancer in her sister; Diabetes in her sister; Heart disease in her maternal uncle and paternal uncle; Kidney failure in her sister; Prostate cancer in her brother; Stroke in her mother. Allergies  Allergen Reactions  . Ace Inhibitors Other (See Comments)    Tolerates lisinopril at home. Pt does not recall allergy.  . Atorvastatin     REACTION: myalgias  . Oxycodone-Acetaminophen     REACTION: confusion, fatigue  . Rosuvastatin     REACTION: leg weakness  . Simvastatin     REACTION: leg weakness  . Codeine Nausea And Vomiting and Rash  . Sulfonamide Derivatives Hives and Rash   Current Outpatient Medications on File Prior to Visit  Medication Sig Dispense Refill  . acetaminophen (TYLENOL) 325 MG tablet Take 650 mg by mouth every 6 (six) hours as needed.    Marland Kitchen allopurinol (ZYLOPRIM) 100 MG tablet TAKE 2 TABLETS BY MOUTH EVERY OTHER DAY AND 1 TABLET EVERY OTHER DAY 135 tablet 3  . amLODipine (NORVASC) 5 MG tablet TAKE 1 TABLET BY MOUTH ONCE A DAY 90 tablet 2  . anastrozole (ARIMIDEX) 1 MG tablet Take 1 tablet (1 mg total) by mouth daily. 90 tablet 4  . aspirin 325 MG EC tablet Take 1 tablet (325 mg total) by mouth every other day.    . cholecalciferol (VITAMIN D) 1000 UNITS tablet Take 1,000 Units by mouth every morning.     . collagenase (SANTYL) ointment Apply 1 application topically  daily. 30 g 1  . doxycycline (VIBRA-TABS) 100 MG tablet Take 1 tablet (100 mg total) by mouth 2 (two) times daily. 20 tablet 0  . doxycycline (VIBRA-TABS) 100 MG tablet Take 1 tablet (100 mg total) by mouth 2 (two) times daily. 20 tablet 0  . ezetimibe (ZETIA) 10 MG tablet Take 1 tablet (10 mg total) by mouth daily. 90 tablet 3  . furosemide (LASIX) 40 MG tablet TAKE 1 TABLET BY MOUTH EVERY MORNING. 90 tablet 3  . levothyroxine (SYNTHROID, LEVOTHROID) 25  MCG tablet Take 1 tablet (25 mcg total) by mouth daily before breakfast. 90 tablet 3  . lisinopril (PRINIVIL,ZESTRIL) 20 MG tablet TAKE 2 TABLETS BY MOUTH ONCE DAILY 180 tablet 3  . pantoprazole (PROTONIX) 40 MG tablet Take 1 tablet (40 mg total) by mouth daily. 30 tablet 0  . predniSONE (DELTASONE) 10 MG tablet 2 tabs by mouth per day for 5 days 10 tablet 0  . traMADol-acetaminophen (ULTRACET) 37.5-325 MG tablet Take 1 tablet by mouth every 6 (six) hours as needed. 120 tablet 2   No current facility-administered medications on file prior to visit.    Review of Systems  Constitutional: Negative for other unusual diaphoresis or sweats HENT: Negative for ear discharge or swelling Eyes: Negative for other worsening visual disturbances Respiratory: Negative for stridor or other swelling  Gastrointestinal: Negative for worsening distension or other blood Genitourinary: Negative for retention or other urinary change Musculoskeletal: Negative for other MSK pain or swelling Skin: Negative for color change or other new lesions Neurological: Negative for worsening tremors and other numbness  Psychiatric/Behavioral: Negative for worsening agitation or other fatigue All otherwise neg per pt     Objective:   Physical Exam BP 126/84   Pulse 70   Temp 97.9 F (36.6 C) (Oral)   Ht 5\' 5"  (1.651 m)   Wt 216 lb (98 kg)   SpO2 95%   BMI 35.94 kg/m  VS noted,  Constitutional: Pt appears in NAD HENT: Head: NCAT.  Right Ear: External ear normal.   Left Ear: External ear normal.  Eyes: . Pupils are equal, round, and reactive to light. Conjunctivae and EOM are normal Nose: without d/c or deformity Neck: Neck supple. Gross normal ROM Cardiovascular: Normal rate and regular rhythm.   Pulmonary/Chest: Effort normal and breath sounds without rales or wheezing.  Abd:  Soft, NT, ND, + BS, no organomegaly Neurological: Pt is alert. At baseline orientation, motor grossly intact Skin: Skin is warm. No rashes, other new lesions, no LE edema Psychiatric: Pt behavior is normal without agitation  All otherwise neg per pt  Lab Results  Component Value Date   WBC 10.8 (H) 06/25/2018   HGB 13.7 06/25/2018   HCT 42.7 06/25/2018   PLT 243.0 06/25/2018   GLUCOSE 127 (H) 03/26/2019   CHOL 191 03/26/2019   TRIG 232.0 (H) 03/26/2019   HDL 36.00 (L) 03/26/2019   LDLDIRECT 129.0 03/26/2019   LDLCALC 124 (H) 06/25/2018   ALT 9 03/26/2019   AST 16 03/26/2019   NA 143 03/26/2019   K 4.3 03/26/2019   CL 102 03/26/2019   CREATININE 1.56 (H) 03/26/2019   BUN 35 (H) 03/26/2019   CO2 31 03/26/2019   TSH 4.98 (H) 03/26/2019   INR 1.05 06/18/2014   HGBA1C 7.1 (H) 03/26/2019   MICROALBUR 3.7 (H) 06/25/2018      Assessment & Plan:

## 2019-05-13 NOTE — Patient Instructions (Signed)
You had the antibiotic shot today (rocephin)  Please take all new medication as prescribed - the cephalexin   Please continue all other medications as before, and refills have been done if requested.  Please have the pharmacy call with any other refills you may need.  Please keep your appointments with your specialists as you may have planned  Please go to the LAB in the Basement (turn left off the elevator) for the tests to be done today - just the urine testing today  You will be contacted by phone if any changes need to be made immediately.  Otherwise, you will receive a letter about your results with an explanation, but please check with MyChart first.  Please remember to sign up for MyChart if you have not done so, as this will be important to you in the future with finding out test results, communicating by private email, and scheduling acute appointments online when needed.

## 2019-05-13 NOTE — Assessment & Plan Note (Signed)
Better then worsening again, high suspciion for uti but dx may be difficult due to recent antibx, for ua and culture, empiric rocephin IM 1 gm, and cephalexin course asd, and f/u urine culture

## 2019-05-13 NOTE — Telephone Encounter (Signed)
Appointment has been made for today (11/30).

## 2019-05-13 NOTE — Assessment & Plan Note (Signed)
stable overall by history and exam, recent data reviewed with pt, and pt to continue medical treatment as before,  to f/u any worsening symptoms or concerns  

## 2019-05-15 LAB — URINE CULTURE
MICRO NUMBER:: 1145949
SPECIMEN QUALITY:: ADEQUATE

## 2019-05-16 ENCOUNTER — Encounter: Payer: Self-pay | Admitting: Internal Medicine

## 2019-05-29 ENCOUNTER — Inpatient Hospital Stay (HOSPITAL_COMMUNITY)
Admission: EM | Admit: 2019-05-29 | Discharge: 2019-05-31 | DRG: 378 | Disposition: A | Discharge status: Home / Self Care | Payer: Medicare Other | Attending: Internal Medicine | Admitting: Internal Medicine

## 2019-05-29 ENCOUNTER — Encounter (HOSPITAL_COMMUNITY): Payer: Self-pay | Admitting: Emergency Medicine

## 2019-05-29 ENCOUNTER — Other Ambulatory Visit: Payer: Self-pay

## 2019-05-29 DIAGNOSIS — Z853 Personal history of malignant neoplasm of breast: Secondary | ICD-10-CM | POA: Diagnosis not present

## 2019-05-29 DIAGNOSIS — Z923 Personal history of irradiation: Secondary | ICD-10-CM | POA: Diagnosis not present

## 2019-05-29 DIAGNOSIS — E785 Hyperlipidemia, unspecified: Secondary | ICD-10-CM | POA: Diagnosis present

## 2019-05-29 DIAGNOSIS — Z87440 Personal history of urinary (tract) infections: Secondary | ICD-10-CM

## 2019-05-29 DIAGNOSIS — D62 Acute posthemorrhagic anemia: Secondary | ICD-10-CM | POA: Diagnosis present

## 2019-05-29 DIAGNOSIS — Z79899 Other long term (current) drug therapy: Secondary | ICD-10-CM

## 2019-05-29 DIAGNOSIS — Z8701 Personal history of pneumonia (recurrent): Secondary | ICD-10-CM

## 2019-05-29 DIAGNOSIS — Z23 Encounter for immunization: Secondary | ICD-10-CM | POA: Diagnosis not present

## 2019-05-29 DIAGNOSIS — Z882 Allergy status to sulfonamides status: Secondary | ICD-10-CM

## 2019-05-29 DIAGNOSIS — M1990 Unspecified osteoarthritis, unspecified site: Secondary | ICD-10-CM | POA: Diagnosis not present

## 2019-05-29 DIAGNOSIS — K921 Melena: Secondary | ICD-10-CM | POA: Diagnosis not present

## 2019-05-29 DIAGNOSIS — K219 Gastro-esophageal reflux disease without esophagitis: Secondary | ICD-10-CM | POA: Diagnosis present

## 2019-05-29 DIAGNOSIS — Z20828 Contact with and (suspected) exposure to other viral communicable diseases: Secondary | ICD-10-CM | POA: Diagnosis present

## 2019-05-29 DIAGNOSIS — I5032 Chronic diastolic (congestive) heart failure: Secondary | ICD-10-CM | POA: Diagnosis not present

## 2019-05-29 DIAGNOSIS — M109 Gout, unspecified: Secondary | ICD-10-CM | POA: Diagnosis present

## 2019-05-29 DIAGNOSIS — I130 Hypertensive heart and chronic kidney disease with heart failure and stage 1 through stage 4 chronic kidney disease, or unspecified chronic kidney disease: Secondary | ICD-10-CM | POA: Diagnosis present

## 2019-05-29 DIAGNOSIS — Z86718 Personal history of other venous thrombosis and embolism: Secondary | ICD-10-CM

## 2019-05-29 DIAGNOSIS — K625 Hemorrhage of anus and rectum: Secondary | ICD-10-CM

## 2019-05-29 DIAGNOSIS — Z8673 Personal history of transient ischemic attack (TIA), and cerebral infarction without residual deficits: Secondary | ICD-10-CM | POA: Diagnosis not present

## 2019-05-29 DIAGNOSIS — Z7982 Long term (current) use of aspirin: Secondary | ICD-10-CM

## 2019-05-29 DIAGNOSIS — Z888 Allergy status to other drugs, medicaments and biological substances status: Secondary | ICD-10-CM

## 2019-05-29 DIAGNOSIS — K5731 Diverticulosis of large intestine without perforation or abscess with bleeding: Secondary | ICD-10-CM | POA: Diagnosis not present

## 2019-05-29 DIAGNOSIS — Z79890 Hormone replacement therapy (postmenopausal): Secondary | ICD-10-CM

## 2019-05-29 DIAGNOSIS — E039 Hypothyroidism, unspecified: Secondary | ICD-10-CM | POA: Diagnosis present

## 2019-05-29 DIAGNOSIS — Z03818 Encounter for observation for suspected exposure to other biological agents ruled out: Secondary | ICD-10-CM | POA: Diagnosis not present

## 2019-05-29 DIAGNOSIS — K922 Gastrointestinal hemorrhage, unspecified: Secondary | ICD-10-CM | POA: Diagnosis present

## 2019-05-29 DIAGNOSIS — R7989 Other specified abnormal findings of blood chemistry: Secondary | ICD-10-CM | POA: Diagnosis present

## 2019-05-29 DIAGNOSIS — Z87442 Personal history of urinary calculi: Secondary | ICD-10-CM

## 2019-05-29 DIAGNOSIS — K5790 Diverticulosis of intestine, part unspecified, without perforation or abscess without bleeding: Secondary | ICD-10-CM | POA: Diagnosis not present

## 2019-05-29 DIAGNOSIS — Z885 Allergy status to narcotic agent status: Secondary | ICD-10-CM

## 2019-05-29 DIAGNOSIS — N1831 Chronic kidney disease, stage 3a: Secondary | ICD-10-CM | POA: Diagnosis not present

## 2019-05-29 DIAGNOSIS — K5791 Diverticulosis of intestine, part unspecified, without perforation or abscess with bleeding: Secondary | ICD-10-CM | POA: Diagnosis not present

## 2019-05-29 DIAGNOSIS — Z85828 Personal history of other malignant neoplasm of skin: Secondary | ICD-10-CM

## 2019-05-29 DIAGNOSIS — I10 Essential (primary) hypertension: Secondary | ICD-10-CM | POA: Diagnosis present

## 2019-05-29 DIAGNOSIS — Z8249 Family history of ischemic heart disease and other diseases of the circulatory system: Secondary | ICD-10-CM

## 2019-05-29 LAB — COMPREHENSIVE METABOLIC PANEL
ALT: 16 U/L (ref 0–44)
AST: 20 U/L (ref 15–41)
Albumin: 3.9 g/dL (ref 3.5–5.0)
Alkaline Phosphatase: 94 U/L (ref 38–126)
Anion gap: 11 (ref 5–15)
BUN: 43 mg/dL — ABNORMAL HIGH (ref 8–23)
CO2: 29 mmol/L (ref 22–32)
Calcium: 9.7 mg/dL (ref 8.9–10.3)
Chloride: 103 mmol/L (ref 98–111)
Creatinine, Ser: 1.42 mg/dL — ABNORMAL HIGH (ref 0.44–1.00)
GFR calc Af Amer: 38 mL/min — ABNORMAL LOW (ref >60)
GFR calc non Af Amer: 33 mL/min — ABNORMAL LOW (ref >60)
Glucose, Bld: 118 mg/dL — ABNORMAL HIGH (ref 70–99)
Potassium: 4.5 mmol/L (ref 3.5–5.1)
Sodium: 143 mmol/L (ref 135–145)
Total Bilirubin: 0.7 mg/dL (ref 0.3–1.2)
Total Protein: 7.8 g/dL (ref 6.5–8.1)

## 2019-05-29 LAB — SARS CORONAVIRUS 2 (TAT 6-24 HRS): SARS Coronavirus 2: NEGATIVE

## 2019-05-29 LAB — CBC
HCT: 42.5 % (ref 36.0–46.0)
Hemoglobin: 12.8 g/dL (ref 12.0–15.0)
MCH: 28.8 pg (ref 26.0–34.0)
MCHC: 30.1 g/dL (ref 30.0–36.0)
MCV: 95.7 fL (ref 80.0–100.0)
Platelets: 227 10*3/uL (ref 150–400)
RBC: 4.44 MIL/uL (ref 3.87–5.11)
RDW: 16.4 % — ABNORMAL HIGH (ref 11.5–15.5)
WBC: 10.2 10*3/uL (ref 4.0–10.5)
nRBC: 0 % (ref 0.0–0.2)

## 2019-05-29 LAB — HEMOGLOBIN AND HEMATOCRIT, BLOOD
HCT: 39.8 % (ref 36.0–46.0)
Hemoglobin: 12.3 g/dL (ref 12.0–15.0)

## 2019-05-29 LAB — PROTIME-INR
INR: 0.9 (ref 0.8–1.2)
Prothrombin Time: 12.1 seconds (ref 11.4–15.2)

## 2019-05-29 MED ORDER — ACETAMINOPHEN 325 MG PO TABS: 650.0000 mg | ORAL_TABLET | Freq: Four times a day (QID) | ORAL | Status: DC | PRN

## 2019-05-29 MED ORDER — ONDANSETRON HCL 4 MG/2ML IJ SOLN: 4.0000 mg | Freq: Four times a day (QID) | INTRAMUSCULAR | Status: DC | PRN

## 2019-05-29 MED ORDER — ONDANSETRON HCL 4 MG PO TABS: 4.0000 mg | ORAL_TABLET | Freq: Four times a day (QID) | ORAL | Status: DC | PRN

## 2019-05-29 MED ORDER — AMLODIPINE BESYLATE 5 MG PO TABS
5.0000 mg | ORAL_TABLET | Freq: Every day | ORAL | Status: DC
  Administered 2019-05-30 – 2019-05-31 (×2): 5 mg via ORAL
  Filled 2019-05-29 (×2): qty 1

## 2019-05-29 MED ORDER — ACETAMINOPHEN 650 MG RE SUPP: 650.0000 mg | Freq: Four times a day (QID) | RECTAL | Status: DC | PRN

## 2019-05-29 MED ORDER — INFLUENZA VAC A&B SA ADJ QUAD 0.5 ML IM PRSY
0.5000 mL | PREFILLED_SYRINGE | INTRAMUSCULAR | Status: AC
  Administered 2019-05-31: 0.5 mL via INTRAMUSCULAR
  Filled 2019-05-29: qty 0.5

## 2019-05-29 NOTE — Consult Note (Signed)
Consultation  Referring Provider:  Dr. Regenia Skeeter   Primary Care Physician:  Biagio Borg, MD Primary Gastroenterologist:  Dr. Henrene Pastor       Reason for Consultation: Hematochezia              HPI:   [name] is a 85 y.o. female with a past medical history as listed below including breast cancer, CKD stage III, DVT on full dose aspirin and GERD, who presented to the ED today with a complaint of rectal bleeding.    Today, the patient describes that she has been passing clots and dark red blood since last night around 6 PM about 5 episodes at home and more since admission.  Apparently there was stool mixed in with the first episode but no stool since, just blood, patient reports maroon clots.  Denies abdominal pain.  Describes this as being similar to previous episodes of diverticular bleeding.  Does explain that she was having some left sided abdominal "tingling" at first, but tells [place] this is not really "a pain".  Does report taking two 325 mg aspirins every other day.   Denies fever, chills, dizziness or syncope.     ED course: Hemoglobin 12.8, this is the patient's baseline, BUN elevated at 43 (chronic for her)   GI history: 06/18/2014 consulted by our service for GI bleed: At that time bleeding thought diverticular, which resolved after 4 days 02/10/2011 colonoscopy for rectal bleeding by Dr. Ardis Hughs: Moderate diverticulosis throughout the colon, internal and external hemorrhoids thought to be the source of bleeding otherwise normal  Past Medical History:  Diagnosis Date  . Anemia   . Anxiety   . Atrial flutter ([place])    "sometimes"  . Blood transfusion   . Breast cancer ([place])    right breast  . Bronchitis   . Chronic kidney disease    occ uti's  . CKD (chronic kidney disease) stage 3, GFR 30-59 ml/min   . Colon polyps    hyperplastic  . CVA (cerebral infarction)   . Diverticulosis   . DVT of leg (deep venous thrombosis) ([name])    left; S/P OR  . Dysrhythmia    dr  bensimhon   . Esophageal stricture   . GERD (gastroesophageal reflux disease)   . Gout   . Hiatal hernia    4 cm  . History of radiation therapy 02/15/12-04/02/12   right breast 4680 cGy/26 sessions, right boost=1400cGy /7 sessions  . Hyperlipidemia   . Hypertension   . IBS (irritable bowel syndrome)   . Kidney stones 1990's  . Morbid obesity ([place])   . Osteoarthritis   . Personal history of radiation therapy   . Pneumonia    "walking"  . PONV (postoperative nausea and vomiting)   . Shingles 02/21/2011  . Shortness of breath on exertion   . Skin cancer   . Stroke [facility]) 2002   residual:  "little tingling in palm of left hand"  . UTI (lower urinary tract infection)    "I've had a few"    Past Surgical History:  Procedure Laterality Date  . ABDOMINAL HYSTERECTOMY    . BILE DUCT STENT PLACEMENT  ~ 01/2011  . BREAST LUMPECTOMY  06/29/11   right  . BREAST LUMPECTOMY  06/29/2011   Procedure: BREAST LUMPECTOMY WITH EXCISION OF SENTINEL NODE;  Surgeon: Pedro Earls, MD;  Location: [place];  Service: General;  Laterality: Right;  right sentinel node mapping,right sentinel node biopsy, needle localization right  breast lumpectomy  . CATARACT EXTRACTION W/ INTRAOCULAR LENS  IMPLANT, BILATERAL  1990's  . CHOLECYSTECTOMY  03/23/11   lap chole   . COLON SURGERY     hole in intestine ,tumors?  . colonic perforation repair  ~ 2000  . [place]  2012  . INCISION AND DRAINAGE ABSCESS N/A 08/12/2013   Procedure: INCISION AND DRAINAGE ABSCESS;  Surgeon: Earnstine Regal, MD;  [facility];  Service: General;  Laterality: N/A;  . INCISION AND DRAINAGE ABSCESS N/A 08/20/2013   Procedure: INCISION AND DRAINAGE ABSCESS;  Surgeon: Ralene Ok, MD;  [facility];  Service: General;  Laterality: N/A;  . INCISION AND DRAINAGE BREAST ABSCESS     right breast seroma  . IRRIGATION AND DEBRIDEMENT ABSCESS N/A 08/14/2013   Procedure: Debridment of perineal tissue and debridement of perineal  wound;  Surgeon: Earnstine Regal, MD;  [facility];  Service: General;  Laterality: N/A;  . IRRIGATION AND DEBRIDEMENT ABSCESS N/A 08/16/2013   Procedure: DRESSING CHANGE AND DEBRIDEMENT OF PERINEUM WOUND;  Surgeon: Earnstine Regal, MD;  [facility];  Service: General;  Laterality: N/A;  . JOINT REPLACEMENT    . MASS EXCISION Right 12/26/2018   Procedure: EXCISION RIGHT ARM SKIN MASS;  Surgeon: Erroll Luna, MD;  Location: [place];  Service: General;  Laterality: Right;  . MINOR APPLICATION OF WOUND VAC N/A 08/20/2013   Procedure: MINOR APPLICATION OF WOUND VAC;  Surgeon: Ralene Ok, MD;  [facility];  Service: General;  Laterality: N/A;  . pelvic bone tumor removal     twice in 2000's  . PILONIDAL CYST DRAINAGE N/A 08/19/2013   Procedure: Dressing change perineum;  Surgeon: Ralene Ok, MD;  [facility];  Service: General;  Laterality: N/A;  . REPLACEMENT TOTAL KNEE BILATERAL  ~ 2008; ~ 2010   right; left  . TONSILLECTOMY  ~ 1946  . TOTAL HIP ARTHROPLASTY  1980's   right    Family History  Problem Relation Age of Onset  . Breast cancer Sister        x 2  . Prostate cancer Brother   . Stroke Mother   . Kidney failure Sister   . Diabetes Sister   . Heart disease Paternal Uncle        multiple  . Heart disease Maternal Uncle        multiple  . Colon cancer Neg Hx     Social History   Tobacco Use  . Smoking status: Never Smoker  . Smokeless tobacco: Never Used  Substance Use Topics  . Alcohol use: No  . Drug use: No    Prior to Admission medications   Medication Sig Start Date End Date Taking? Authorizing Provider  acetaminophen (TYLENOL) 325 MG tablet Take 650 mg by mouth every 6 (six) hours as needed.    [provider]  allopurinol (ZYLOPRIM) 100 MG tablet TAKE 2 TABLETS BY MOUTH EVERY OTHER DAY AND 1 TABLET EVERY OTHER DAY 07/16/18   Biagio Borg, MD  amLODipine (NORVASC) 5 MG tablet TAKE 1 TABLET BY MOUTH ONCE A DAY  04/19/19   Biagio Borg, MD  anastrozole (ARIMIDEX) 1 MG tablet Take 1 tablet (1 mg total) by mouth daily. 04/11/16   Magrinat, Virgie Dad, MD  aspirin 325 MG EC tablet Take 1 tablet (325 mg total) by mouth every other day. 06/23/14   Hosie Poisson, MD  cholecalciferol (VITAMIN D) 1000 UNITS tablet Take 1,000 Units by  mouth every morning.     [provider]  collagenase (SANTYL) ointment Apply 1 application topically daily. 03/26/19   Biagio Borg, MD  doxycycline (VIBRA-TABS) 100 MG tablet Take 1 tablet (100 mg total) by mouth 2 (two) times daily. 01/15/19   Biagio Borg, MD  doxycycline (VIBRA-TABS) 100 MG tablet Take 1 tablet (100 mg total) by mouth 2 (two) times daily. 04/16/19   Biagio Borg, MD  ezetimibe (ZETIA) 10 MG tablet Take 1 tablet (10 mg total) by mouth daily. 04/21/16   Biagio Borg, MD  furosemide (LASIX) 40 MG tablet TAKE 1 TABLET BY MOUTH EVERY MORNING. 07/16/18   Biagio Borg, MD  levothyroxine (SYNTHROID, LEVOTHROID) 25 MCG tablet Take 1 tablet (25 mcg total) by mouth daily before breakfast. 06/27/18   Biagio Borg, MD  lisinopril (PRINIVIL,ZESTRIL) 20 MG tablet TAKE 2 TABLETS BY MOUTH ONCE DAILY 07/16/18   Biagio Borg, MD  pantoprazole (PROTONIX) 40 MG tablet Take 1 tablet (40 mg total) by mouth daily. 06/23/14   Hosie Poisson, MD  predniSONE (DELTASONE) 10 MG tablet 2 tabs by mouth per day for 5 days 10/31/17   Biagio Borg, MD  traMADol-acetaminophen (ULTRACET) 37.5-325 MG tablet Take 1 tablet by mouth every 6 (six) hours as needed. 04/16/19   Biagio Borg, MD    No current facility-administered medications for this encounter.   Current Outpatient Medications  Medication Sig Dispense Refill  . acetaminophen (TYLENOL) 325 MG tablet Take 650 mg by mouth every 6 (six) hours as needed.    Marland Kitchen allopurinol (ZYLOPRIM) 100 MG tablet TAKE 2 TABLETS BY MOUTH EVERY OTHER DAY AND 1 TABLET EVERY OTHER DAY 135 tablet 3  . amLODipine (NORVASC) 5 MG tablet TAKE 1 TABLET BY MOUTH ONCE  A DAY 90 tablet 2  . anastrozole (ARIMIDEX) 1 MG tablet Take 1 tablet (1 mg total) by mouth daily. 90 tablet 4  . aspirin 325 MG EC tablet Take 1 tablet (325 mg total) by mouth every other day.    . cholecalciferol (VITAMIN D) 1000 UNITS tablet Take 1,000 Units by mouth every morning.     . collagenase (SANTYL) ointment Apply 1 application topically daily. 30 g 1  . doxycycline (VIBRA-TABS) 100 MG tablet Take 1 tablet (100 mg total) by mouth 2 (two) times daily. 20 tablet 0  . doxycycline (VIBRA-TABS) 100 MG tablet Take 1 tablet (100 mg total) by mouth 2 (two) times daily. 20 tablet 0  . ezetimibe (ZETIA) 10 MG tablet Take 1 tablet (10 mg total) by mouth daily. 90 tablet 3  . furosemide (LASIX) 40 MG tablet TAKE 1 TABLET BY MOUTH EVERY MORNING. 90 tablet 3  . levothyroxine (SYNTHROID, LEVOTHROID) 25 MCG tablet Take 1 tablet (25 mcg total) by mouth daily before breakfast. 90 tablet 3  . lisinopril (PRINIVIL,ZESTRIL) 20 MG tablet TAKE 2 TABLETS BY MOUTH ONCE DAILY 180 tablet 3  . pantoprazole (PROTONIX) 40 MG tablet Take 1 tablet (40 mg total) by mouth daily. 30 tablet 0  . predniSONE (DELTASONE) 10 MG tablet 2 tabs by mouth per day for 5 days 10 tablet 0  . traMADol-acetaminophen (ULTRACET) 37.5-325 MG tablet Take 1 tablet by mouth every 6 (six) hours as needed. 120 tablet 2    Allergies as of 05/29/2019 - Review Complete 05/29/2019  Allergen Reaction Noted  . Ace inhibitors Other (See Comments) 06/29/2011  . Atorvastatin  08/18/2010  . Oxycodone-acetaminophen  04/08/2008  . Rosuvastatin  10/17/2007  .  Simvastatin  10/17/2007  . Codeine Nausea And Vomiting and Rash 02/10/2007  . Sulfonamide derivatives Hives and Rash 02/10/2007     Review of Systems:    Constitutional: No weight loss, fever or chills Skin: No rash  Cardiovascular: No chest pain Respiratory: No SOB  Gastrointestinal: See HPI and otherwise negative Genitourinary: No dysuria  Neurological: No headache Musculoskeletal:  No new muscle or joint pain Hematologic: No bruising Psychiatric: No history of depression or anxiety    Physical Exam:  Vital signs in last 24 hours: Temp:  [97.7 F (36.5 C)] 97.7 F (36.5 C) (12/16 1332) Pulse Rate:  [75-90] 75 (12/16 1500) Resp:  [18-23] 18 (12/16 1500) BP: (169-178)/(73-89) 169/89 (12/16 1500) SpO2:  [94 %-98 %] 94 % (12/16 1500)   General:   Pleasant Elderly Caucasian female appears to be in NAD, Well developed, Well nourished, alert and cooperative Head:  Normocephalic and atraumatic. Eyes:   PEERL, EOMI. No icterus. Conjunctiva pink. Ears:  Normal auditory acuity. Neck:  Supple Throat: Oral cavity and pharynx without inflammation, swelling or lesion.  Lungs: Respirations even and unlabored. Lungs clear to auscultation bilaterally.   No wheezes, crackles, or rhonchi.  Heart: Normal S1, S2. No MRG. Regular rate and rhythm. No peripheral edema, cyanosis or pallor.  Abdomen:  Soft, nondistended, nontender. No rebound or guarding. Normal bowel sounds. No appreciable masses or hepatomegaly. Rectal:  Not performed.  Msk:  Symmetrical without gross deformities. Peripheral pulses intact.  Extremities:  Without edema, no deformity or joint abnormality.  Neurologic:  Alert and  oriented x4;  grossly normal neurologically.  Skin:   Dry and intact without significant lesions or rashes. Psychiatric: Demonstrates good judgement and reason without abnormal affect or behaviors.   LAB RESULTS: Recent Labs    05/29/19 1413  WBC 10.2  HGB 12.8  HCT 42.5  PLT 227   BMET Recent Labs    05/29/19 1413  NA 143  K 4.5  CL 103  CO2 29  GLUCOSE 118*  BUN 43*  CREATININE 1.42*  CALCIUM 9.7   LFT Recent Labs    05/29/19 1413  PROT 7.8  ALBUMIN 3.9  AST 20  ALT 16  ALKPHOS 94  BILITOT 0.7   PT/INR Recent Labs    05/29/19 1413  LABPROT 12.1  INR 0.9    Impression / Plan:   Impression: 1.  Rectal bleeding: Started 05/28/2019 around 6 pm, multiple  episodes since then, hemoglobin has remained normal, patient has had previous diverticular bleeds, last in 2016, last colonoscopy in 2012 normal other than diverticula; most likely diverticular 2. CKD 3. CVA: reports taking 2 ASA 325 mg eod (question if this could be contributing to diverticular bleed)  Plan: 1.  Continue to monitor hemoglobin with transfusion as needed less than 7 2.  Patient can be on a clear liquid diet for now 3.  Discussed possible bowel prep and colonoscopy with the patient but she would rather not have one unless she needs it.  For now will just observe the patient and monitor hemoglobin as above. 4.  If patient has a large amount of bleeding in the near future would recommend starting a bowel prep.  We will try to avoid CTA given CKD. 5.  Please await further recommendations from Dr. Bryan Lemma later today  Thank you for your kind consultation, we will continue to follow.  Lavone Nian Jarrick Fjeld  05/29/2019, 3:37 PM

## 2019-05-29 NOTE — ED Notes (Addendum)
Provided patient's son with an update, with the permission of the patient. Patient's son is requesting call at time of patient transfer.

## 2019-05-29 NOTE — ED Provider Notes (Signed)
[facility] DEPT Provider Note   CSN: [id number] Arrival date & time: 05/29/19  1308     History Chief Complaint  Patient presents with  . Rectal Bleeding    [name] is a 85 y.o. female.  HPI  85 year old female presents with rectal bleeding since last night.  Has been having clots and dark red blood.  Has had about 5 episodes today.  There was stool the first time but no stool since.  No abdominal pain.  Does not currently feel lightheaded or dizzy and has had no chest pain.  No vomiting.  Similar to prior episodes and she has been diagnosed with diverticulosis.  She is on a full dose aspirin but no other blood thinners.  Past Medical History:  Diagnosis Date  . Anemia   . Anxiety   . Atrial flutter ([place])    "sometimes"  . Blood transfusion   . Breast cancer ([place])    right breast  . Bronchitis   . Chronic kidney disease    occ uti's  . CKD (chronic kidney disease) stage 3, GFR 30-59 ml/min   . Colon polyps    hyperplastic  . CVA (cerebral infarction)   . Diverticulosis   . DVT of leg (deep venous thrombosis) ([name])    left; S/P OR  . Dysrhythmia    dr bensimhon   . Esophageal stricture   . GERD (gastroesophageal reflux disease)   . Gout   . Hiatal hernia    4 cm  . History of radiation therapy 02/15/12-04/02/12   right breast 4680 cGy/26 sessions, right boost=1400cGy /7 sessions  . Hyperlipidemia   . Hypertension   . IBS (irritable bowel syndrome)   . Kidney stones 1990's  . Morbid obesity ([place])   . Osteoarthritis   . Personal history of radiation therapy   . Pneumonia    "walking"  . PONV (postoperative nausea and vomiting)   . Shingles 02/21/2011  . Shortness of breath on exertion   . Skin cancer   . Stroke [facility]) 2002   residual:  "little tingling in palm of left hand"  . UTI (lower urinary tract infection)    "I've had a few"    Patient Active Problem List   Diagnosis Date Noted  . Swelling of vagina 04/16/2019   . Skin lesion 03/26/2019  . Left leg cellulitis 01/15/2019  . Healed diabetic foot ulcer 12/01/2017  . Abnormal TSH 11/15/2016  . Preventative health care 04/20/2016  . Allergic rhinitis 04/20/2016  . Dysuria 12/09/2014  . External hemorrhoids without complication 33/29/5188  . Left ear pain 07/08/2014  . Hip pain   . GIB (gastrointestinal bleeding) 06/18/2014  . UTI (lower urinary tract infection) 06/18/2014  . Abdominal pain   . Lower GI bleed   . Arthritis 02/18/2014  . Enteritis due to Clostridium difficile 10/12/2013  . CKD (chronic kidney disease) stage 3, GFR 30-59 ml/min 10/11/2013  . Chronic diastolic CHF (congestive heart failure) ([place]) 10/11/2013  . Protein-calorie malnutrition, severe ([place]) 10/11/2013  . Hx of necrotizing fasciitis 10/10/2013  . GI bleed 10/10/2013  . AKI (acute kidney injury) ([place]) 10/10/2013  . Cough 09/10/2013  . Rectal bleeding 09/10/2013  . Acute on chronic diastolic CHF (congestive heart failure) ([place]) 08/31/2013  . Rash and nonspecific skin eruption 08/27/2013  . Pulmonary edema 08/25/2013  . Severe sepsis with septic shock ([place]) 08/13/2013  . Necrotizing fasciitis of perineum 08/12/2013  . Acute on chronic kidney disease, stage 3 08/12/2013  .  Wheezing 06/07/2013  . Wound healing, delayed 01/26/2012  . Diverticulosis of colon with hemorrhage 12/19/2011  . Acute posthemorrhagic anemia 12/19/2011  . Malignant neoplasm of upper-outer quadrant of right breast in female, estrogen receptor positive ([place]) 08/01/2011  . Breast cancer, right breast-lobular s/p lumpectomy and sentinel node biopsy 06/30/2011  . Shingles 02/21/2011  . Bile duct calculus with nonacute cholecystitis 01/21/2011  . Morbid obesity ([place]) 01/21/2011  . TREMOR 08/17/2010  . Diabetes ([place]) 01/09/2008  . PERIPHERAL NEUROPATHY 10/17/2007  . FATIGUE 10/17/2007  . COLONIC POLYPS, HX OF 02/12/2007  . Hyperlipidemia 02/10/2007  . GOUT 02/10/2007  . ANXIETY 02/10/2007  .  Essential hypertension 02/10/2007  . CVA 02/10/2007  . DIVERTICULOSIS, COLON 02/10/2007  . IRRITABLE BOWEL, PREDOMINANTLY CONSTIPATION 02/10/2007    Past Surgical History:  Procedure Laterality Date  . ABDOMINAL HYSTERECTOMY    . BILE DUCT STENT PLACEMENT  ~ 01/2011  . BREAST LUMPECTOMY  06/29/11   right  . BREAST LUMPECTOMY  06/29/2011   Procedure: BREAST LUMPECTOMY WITH EXCISION OF SENTINEL NODE;  Surgeon: Pedro Earls, MD;  Location: [facility];  Service: General;  Laterality: Right;  right sentinel node mapping,right sentinel node biopsy, needle localization right breast lumpectomy  . CATARACT EXTRACTION W/ INTRAOCULAR LENS  IMPLANT, BILATERAL  1990's  . CHOLECYSTECTOMY  03/23/11   lap chole   . COLON SURGERY     hole in intestine ,tumors?  . colonic perforation repair  ~ 2000  . [place]  2012  . INCISION AND DRAINAGE ABSCESS N/A 08/12/2013   Procedure: INCISION AND DRAINAGE ABSCESS;  Surgeon: Earnstine Regal, MD;  [facility];  Service: General;  Laterality: N/A;  . INCISION AND DRAINAGE ABSCESS N/A 08/20/2013   Procedure: INCISION AND DRAINAGE ABSCESS;  Surgeon: Ralene Ok, MD;  [facility];  Service: General;  Laterality: N/A;  . INCISION AND DRAINAGE BREAST ABSCESS     right breast seroma  . IRRIGATION AND DEBRIDEMENT ABSCESS N/A 08/14/2013   Procedure: Debridment of perineal tissue and debridement of perineal wound;  Surgeon: Earnstine Regal, MD;  [facility];  Service: General;  Laterality: N/A;  . IRRIGATION AND DEBRIDEMENT ABSCESS N/A 08/16/2013   Procedure: DRESSING CHANGE AND DEBRIDEMENT OF PERINEUM WOUND;  Surgeon: Earnstine Regal, MD;  [facility];  Service: General;  Laterality: N/A;  . JOINT REPLACEMENT    . MASS EXCISION Right 12/26/2018   Procedure: EXCISION RIGHT ARM SKIN MASS;  Surgeon: Erroll Luna, MD;  Location: [place];  Service: General;  Laterality: Right;  . MINOR APPLICATION OF WOUND VAC N/A 08/20/2013   Procedure:  MINOR APPLICATION OF WOUND VAC;  Surgeon: Ralene Ok, MD;  [facility];  Service: General;  Laterality: N/A;  . pelvic bone tumor removal     twice in 2000's  . PILONIDAL CYST DRAINAGE N/A 08/19/2013   Procedure: Dressing change perineum;  Surgeon: Ralene Ok, MD;  [facility];  Service: General;  Laterality: N/A;  . REPLACEMENT TOTAL KNEE BILATERAL  ~ 2008; ~ 2010   right; left  . TONSILLECTOMY  ~ 1946  . TOTAL HIP ARTHROPLASTY  1980's   right     OB History   No obstetric history on file.     Family History  Problem Relation Age of Onset  . Breast cancer Sister        x 2  . Prostate cancer Brother   . Stroke Mother   . Kidney failure Sister   .  Diabetes Sister   . Heart disease Paternal Uncle        multiple  . Heart disease Maternal Uncle        multiple  . Colon cancer Neg Hx     Social History   Tobacco Use  . Smoking status: Never Smoker  . Smokeless tobacco: Never Used  Substance Use Topics  . Alcohol use: No  . Drug use: No    Home Medications Prior to Admission medications   Medication Sig Start Date End Date Taking? Authorizing Provider  acetaminophen (TYLENOL) 325 MG tablet Take 650 mg by mouth every 6 (six) hours as needed.    [provider]  allopurinol (ZYLOPRIM) 100 MG tablet TAKE 2 TABLETS BY MOUTH EVERY OTHER DAY AND 1 TABLET EVERY OTHER DAY 07/16/18   Biagio Borg, MD  amLODipine (NORVASC) 5 MG tablet TAKE 1 TABLET BY MOUTH ONCE A DAY 04/19/19   Biagio Borg, MD  anastrozole (ARIMIDEX) 1 MG tablet Take 1 tablet (1 mg total) by mouth daily. 04/11/16   Magrinat, Virgie Dad, MD  aspirin 325 MG EC tablet Take 1 tablet (325 mg total) by mouth every other day. 06/23/14   Hosie Poisson, MD  cholecalciferol (VITAMIN D) 1000 UNITS tablet Take 1,000 Units by mouth every morning.     [provider]  collagenase (SANTYL) ointment Apply 1 application topically daily. 03/26/19   Biagio Borg, MD  doxycycline (VIBRA-TABS) 100  MG tablet Take 1 tablet (100 mg total) by mouth 2 (two) times daily. 01/15/19   Biagio Borg, MD  doxycycline (VIBRA-TABS) 100 MG tablet Take 1 tablet (100 mg total) by mouth 2 (two) times daily. 04/16/19   Biagio Borg, MD  ezetimibe (ZETIA) 10 MG tablet Take 1 tablet (10 mg total) by mouth daily. 04/21/16   Biagio Borg, MD  furosemide (LASIX) 40 MG tablet TAKE 1 TABLET BY MOUTH EVERY MORNING. 07/16/18   Biagio Borg, MD  levothyroxine (SYNTHROID, LEVOTHROID) 25 MCG tablet Take 1 tablet (25 mcg total) by mouth daily before breakfast. 06/27/18   Biagio Borg, MD  lisinopril (PRINIVIL,ZESTRIL) 20 MG tablet TAKE 2 TABLETS BY MOUTH ONCE DAILY 07/16/18   Biagio Borg, MD  pantoprazole (PROTONIX) 40 MG tablet Take 1 tablet (40 mg total) by mouth daily. 06/23/14   Hosie Poisson, MD  predniSONE (DELTASONE) 10 MG tablet 2 tabs by mouth per day for 5 days 10/31/17   Biagio Borg, MD  traMADol-acetaminophen (ULTRACET) 37.5-325 MG tablet Take 1 tablet by mouth every 6 (six) hours as needed. 04/16/19   Biagio Borg, MD    Allergies    Ace inhibitors, Atorvastatin, Oxycodone-acetaminophen, Rosuvastatin, Simvastatin, Codeine, and Sulfonamide derivatives  Review of Systems   Review of Systems  Respiratory: Negative for shortness of breath.   Cardiovascular: Negative for chest pain.  Gastrointestinal: Positive for blood in stool. Negative for abdominal pain and vomiting.  Neurological: Negative for light-headedness.  All other systems reviewed and are negative.   Physical Exam Updated Vital Signs BP (!) 169/89   Pulse 75   Temp 97.7 F (36.5 C) (Oral)   Resp 18   SpO2 94%   Physical Exam Vitals and nursing note reviewed.  Constitutional:      General: She is not in acute distress.    Appearance: She is well-developed. She is not ill-appearing or diaphoretic.  HENT:     Head: Normocephalic and atraumatic.     Right Ear: External  ear normal.     Left Ear: External ear normal.     Nose: Nose  normal.  Eyes:     General:        Right eye: No discharge.        Left eye: No discharge.  Cardiovascular:     Rate and Rhythm: Normal rate and regular rhythm.     Heart sounds: Normal heart sounds.  Pulmonary:     Effort: Pulmonary effort is normal.     Breath sounds: Normal breath sounds.  Abdominal:     General: There is no distension.     Palpations: Abdomen is soft.     Tenderness: There is no abdominal tenderness.  Genitourinary:    Comments: Dark red blood on digital rectal exam. No obvious hemorrhoids or masses Skin:    General: Skin is warm and dry.  Neurological:     Mental Status: She is alert.  Psychiatric:        Mood and Affect: Mood is not anxious.     ED Results / Procedures / Treatments   Labs (all labs ordered are listed, but only abnormal results are displayed) Labs Reviewed  COMPREHENSIVE METABOLIC PANEL - Abnormal; Notable for the following components:      Result Value   Glucose, Bld 118 (*)    BUN 43 (*)    Creatinine, Ser 1.42 (*)    GFR calc non Af Amer 33 (*)    GFR calc Af Amer 38 (*)    All other components within normal limits  CBC - Abnormal; Notable for the following components:   RDW 16.4 (*)    All other components within normal limits  SARS CORONAVIRUS 2 (TAT 6-24 HRS)  PROTIME-INR  TYPE AND SCREEN    EKG None  Radiology No results found.  Procedures Procedures (including critical care time)  Medications Ordered in ED Medications - No data to display  ED Course  I have reviewed the triage vital signs and the nursing notes.  Pertinent labs & imaging results that were available during my care of the patient were reviewed by me and considered in my medical decision making (see chart for details).    MDM Rules/Calculators/A&P                      Patient is not hemodynamically unstable but clearly has been having lots of bleeding and blood clots.  History of diverticulosis.  Her initial hemoglobin is okay but with  active bleeding on aspirin and her age I think it is reasonable to have her observed overnight with repeat CBCs.  No abdominal tenderness or pain to suggest needing an emergent CT scan. Dr Olevia Bowens to admit. Final Clinical Impression(s) / ED Diagnoses Final diagnoses:  Rectal bleeding    Rx / DC Orders ED Discharge Orders    None       Sherwood Gambler, MD 05/29/19 1530

## 2019-05-29 NOTE — ED Triage Notes (Addendum)
Patient complains of dark red blood from her rectum with clots. She states this started last night and continued into today. In the past she has had a similar issue in which she had to have a blood transfusion. She currently takes Aspirin. She reports some dizziness and fatigue.

## 2019-05-29 NOTE — H&P (Signed)
History and Physical    [name] ELT:[id number] DOB: [birth year] DOA: 05/29/2019  PCP: Marie Borg, MD   Patient coming from: Home.  I have personally briefly reviewed patient's old medical records in [place]  Chief Complaint: Bloody BM.  HPI: [name] is a 85 y.o. female with medical history significant of anemia, anxiety, atrial flutter, breast cancer, chronic kidney disease, history of CVA, diverticulosis, dysrhythmia, GERD, gout, hyperlipidemia, hypertension, lithiasis, morbid obesity, osteoarthritis, history of pneumonia, history of herpes zoster, history of skin cancer, history of nonhemorrhagic stroke who is coming to the emergency department with complaints of multiple bowel movements with hematochezia since yesterday evening.  She denies abdominal pain, nausea, emesis, melena or constipation.  She says that she has felt a little lightheadedness, but denies chest pain, palpitations, diaphoresis, PND orthopnea.  She gets frequent lower extremity edema.  No dysuria, frequency hematuria.  No polyuria, polydipsia, polyphagia or blurred vision.  ED Course: Initial vital signs were temperature 97.7 F, pulse 90, respirations 20, blood pressure 170/76 mmHg and O2 sat 96% on room air.  White count was 10.2, hemoglobin 12.8 g/dL and platelets 227.  PT was 12.1 and INR 0.9.  CMP shows a glucose of 118, BUN of 43 and creatinine 1.42 mg/dL.  Low other values are within normal limits.    Review of Systems: As per HPI otherwise 10 point review of systems negative.   Past Medical History:  Diagnosis Date  . Anemia   . Anxiety   . Atrial flutter ([place])    "sometimes"  . Blood transfusion   . Breast cancer ([place])    right breast  . Bronchitis   . Chronic kidney disease    occ uti's  . CKD (chronic kidney disease) stage 3, GFR 30-59 ml/min   . Colon polyps    hyperplastic  . CVA (cerebral infarction)   . Diverticulosis   . DVT of leg (deep venous thrombosis) ([name])     left; S/P OR  . Dysrhythmia    dr bensimhon   . Esophageal stricture   . GERD (gastroesophageal reflux disease)   . Gout   . Hiatal hernia    4 cm  . History of radiation therapy 02/15/12-04/02/12   right breast 4680 cGy/26 sessions, right boost=1400cGy /7 sessions  . Hyperlipidemia   . Hypertension   . IBS (irritable bowel syndrome)   . Kidney stones 1990's  . Morbid obesity ([place])   . Osteoarthritis   . Personal history of radiation therapy   . Pneumonia    "walking"  . PONV (postoperative nausea and vomiting)   . Shingles 02/21/2011  . Shortness of breath on exertion   . Skin cancer   . Stroke [facility]) 2002   residual:  "little tingling in palm of left hand"  . UTI (lower urinary tract infection)    "I've had a few"    Past Surgical History:  Procedure Laterality Date  . ABDOMINAL HYSTERECTOMY    . BILE DUCT STENT PLACEMENT  ~ 01/2011  . BREAST LUMPECTOMY  06/29/11   right  . BREAST LUMPECTOMY  06/29/2011   Procedure: BREAST LUMPECTOMY WITH EXCISION OF SENTINEL NODE;  Surgeon: Pedro Earls, MD;  Location: [place];  Service: General;  Laterality: Right;  right sentinel node mapping,right sentinel node biopsy, needle localization right breast lumpectomy  . CATARACT EXTRACTION W/ INTRAOCULAR LENS  IMPLANT, BILATERAL  1990's  . CHOLECYSTECTOMY  03/23/11   lap chole   . COLON  SURGERY     hole in intestine ,tumors?  . colonic perforation repair  ~ 2000  . [place]  2012  . INCISION AND DRAINAGE ABSCESS N/A 08/12/2013   Procedure: INCISION AND DRAINAGE ABSCESS;  Surgeon: Earnstine Regal, MD;  [facility];  Service: General;  Laterality: N/A;  . INCISION AND DRAINAGE ABSCESS N/A 08/20/2013   Procedure: INCISION AND DRAINAGE ABSCESS;  Surgeon: Ralene Ok, MD;  [facility];  Service: General;  Laterality: N/A;  . INCISION AND DRAINAGE BREAST ABSCESS     right breast seroma  . IRRIGATION AND DEBRIDEMENT ABSCESS N/A 08/14/2013   Procedure: Debridment of perineal  tissue and debridement of perineal wound;  Surgeon: Earnstine Regal, MD;  [facility];  Service: General;  Laterality: N/A;  . IRRIGATION AND DEBRIDEMENT ABSCESS N/A 08/16/2013   Procedure: DRESSING CHANGE AND DEBRIDEMENT OF PERINEUM WOUND;  Surgeon: Earnstine Regal, MD;  [facility];  Service: General;  Laterality: N/A;  . JOINT REPLACEMENT    . MASS EXCISION Right 12/26/2018   Procedure: EXCISION RIGHT ARM SKIN MASS;  Surgeon: Erroll Luna, MD;  Location: [place];  Service: General;  Laterality: Right;  . MINOR APPLICATION OF WOUND VAC N/A 08/20/2013   Procedure: MINOR APPLICATION OF WOUND VAC;  Surgeon: Ralene Ok, MD;  [facility];  Service: General;  Laterality: N/A;  . pelvic bone tumor removal     twice in 2000's  . PILONIDAL CYST DRAINAGE N/A 08/19/2013   Procedure: Dressing change perineum;  Surgeon: Ralene Ok, MD;  [facility];  Service: General;  Laterality: N/A;  . REPLACEMENT TOTAL KNEE BILATERAL  ~ 2008; ~ 2010   right; left  . TONSILLECTOMY  ~ 1946  . TOTAL HIP ARTHROPLASTY  1980's   right     reports that she has never smoked. She has never used smokeless tobacco. She reports that she does not drink alcohol or use drugs.  Allergies  Allergen Reactions  . Ace Inhibitors Other (See Comments)    Tolerates lisinopril at home. Pt does not recall allergy.  . Atorvastatin     REACTION: myalgias  . Oxycodone-Acetaminophen     REACTION: confusion, fatigue  . Rosuvastatin     REACTION: leg weakness  . Simvastatin     REACTION: leg weakness  . Codeine Nausea And Vomiting and Rash  . Sulfonamide Derivatives Hives and Rash    Family History  Problem Relation Age of Onset  . Breast cancer Sister        x 2  . Prostate cancer Brother   . Stroke Mother   . Kidney failure Sister   . Diabetes Sister   . Heart disease Paternal Uncle        multiple  . Heart disease Maternal Uncle        multiple  . Colon cancer Neg Hx    Prior  to Admission medications   Medication Sig Start Date End Date Taking? Authorizing Provider  acetaminophen (TYLENOL) 325 MG tablet Take 650 mg by mouth every 6 (six) hours as needed (arthritis).    Yes [provider]  allopurinol (ZYLOPRIM) 100 MG tablet TAKE 2 TABLETS BY MOUTH EVERY OTHER DAY AND 1 TABLET EVERY OTHER DAY Patient taking differently: 100-200 mg See admin instructions. TAKE 2 TABLETS BY MOUTH EVERY OTHER DAY AND 1 TABLET EVERY OTHER DAY 07/16/18  Yes Marie Borg, MD  amLODipine (NORVASC) 5 MG tablet TAKE 1 TABLET BY MOUTH  ONCE A DAY Patient taking differently: Take 5 mg by mouth daily.  04/19/19  Yes Marie Borg, MD  aspirin 325 MG EC tablet Take 1 tablet (325 mg total) by mouth every other day. 06/23/14  Yes Hosie Poisson, MD  cholecalciferol (VITAMIN D) 1000 UNITS tablet Take 1,000 Units by mouth every morning.    Yes [provider]  furosemide (LASIX) 40 MG tablet TAKE 1 TABLET BY MOUTH EVERY MORNING. Patient taking differently: Take 40 mg by mouth daily.  07/16/18  Yes Marie Borg, MD  lisinopril (PRINIVIL,ZESTRIL) 20 MG tablet TAKE 2 TABLETS BY MOUTH ONCE DAILY Patient taking differently: Take 40 mg by mouth daily.  07/16/18  Yes Marie Borg, MD  collagenase (SANTYL) ointment Apply 1 application topically daily. 03/26/19   Marie Borg, MD  doxycycline (VIBRA-TABS) 100 MG tablet Take 1 tablet (100 mg total) by mouth 2 (two) times daily. Patient not taking: Reported on 05/29/2019 01/15/19   Marie Borg, MD  doxycycline (VIBRA-TABS) 100 MG tablet Take 1 tablet (100 mg total) by mouth 2 (two) times daily. Patient not taking: Reported on 05/29/2019 04/16/19   Marie Borg, MD  levothyroxine (SYNTHROID, LEVOTHROID) 25 MCG tablet Take 1 tablet (25 mcg total) by mouth daily before breakfast. Patient not taking: Reported on 05/29/2019 06/27/18   Marie Borg, MD    Physical Exam: Vitals:   05/29/19 1630 05/29/19 1730 05/29/19 1800 05/29/19 1830  BP: (!) 161/61  (!) 143/62 (!) 151/74 (!) 171/73  Pulse: 74 70 78 (!) 48  Resp: 16 (!) 23 19 20   Temp:      TempSrc:      SpO2: 93% 92% 92% 92%    Constitutional: NAD, calm, comfortable Eyes: PERRL, lids and conjunctivae normal ENMT: Mucous membranes are moist. Posterior pharynx clear of any exudate or lesions. Neck: normal, supple, no masses, no thyromegaly Respiratory: clear to auscultation bilaterally, no wheezing, no crackles. Normal respiratory effort. No accessory muscle use.  Cardiovascular: Regular rate with occasional extrasystole, no murmurs / rubs / gallops.  Trace lower extremity pitting edema.  Stage III lymphedema.  2+ pedal pulses. No carotid bruits.  Abdomen: Nondistended.  Soft, no tenderness, no masses palpated. No hepatosplenomegaly. Bowel sounds positive.  Musculoskeletal: no clubbing / cyanosis. Good ROM, no contractures. Normal muscle tone.  Skin: no rashes, lesions, ulcers on limited dermatological examination. Neurologic: CN 2-12 grossly intact. Sensation intact, DTR normal. Strength 5/5 in all 4.  Psychiatric: Normal judgment and insight. Alert and oriented x 3. Normal mood.   Labs on Admission: I have personally reviewed following labs and imaging studies  CBC: Recent Labs  Lab 05/29/19 1413  WBC 10.2  HGB 12.8  HCT 42.5  MCV 95.7  PLT 161   Basic Metabolic Panel: Recent Labs  Lab 05/29/19 1413  NA 143  K 4.5  CL 103  CO2 29  GLUCOSE 118*  BUN 43*  CREATININE 1.42*  CALCIUM 9.7   GFR: CrCl cannot be calculated (Unknown ideal weight.). Liver Function Tests: Recent Labs  Lab 05/29/19 1413  AST 20  ALT 16  ALKPHOS 94  BILITOT 0.7  PROT 7.8  ALBUMIN 3.9   No results for input(s): LIPASE, AMYLASE in the last 168 hours. No results for input(s): AMMONIA in the last 168 hours. Coagulation Profile: Recent Labs  Lab 05/29/19 1413  INR 0.9   Cardiac Enzymes: No results for input(s): CKTOTAL, CKMB, CKMBINDEX, TROPONINI in the last 168 hours. BNP (last  3  results) No results for input(s): PROBNP in the last 8760 hours. HbA1C: No results for input(s): HGBA1C in the last 72 hours. CBG: No results for input(s): GLUCAP in the last 168 hours. Lipid Profile: No results for input(s): CHOL, HDL, LDLCALC, TRIG, CHOLHDL, LDLDIRECT in the last 72 hours. Thyroid Function Tests: No results for input(s): TSH, T4TOTAL, FREET4, T3FREE, THYROIDAB in the last 72 hours. Anemia Panel: No results for input(s): VITAMINB12, FOLATE, FERRITIN, TIBC, IRON, RETICCTPCT in the last 72 hours. Urine analysis:    Component Value Date/Time   COLORURINE YELLOW 05/13/2019 1349   APPEARANCEUR [place] (A) 05/13/2019 1349   LABSPEC 1.015 05/13/2019 1349   PHURINE 5.5 05/13/2019 1349   GLUCOSEU NEGATIVE 05/13/2019 1349   HGBUR NEGATIVE 05/13/2019 [place] 05/13/2019 1349   BILIRUBINUR neg 12/09/2014 1506   KETONESUR NEGATIVE 05/13/2019 1349   PROTEINUR neg 12/09/2014 1506   PROTEINUR NEGATIVE 06/18/2014 0334   UROBILINOGEN 0.2 05/13/2019 1349   NITRITE NEGATIVE 05/13/2019 1349   LEUKOCYTESUR SMALL (A) 05/13/2019 1349    Radiological Exams on Admission: No results found.  EKG: Independently reviewed.  Assessment/Plan Principal Problem:   Hematochezia Observation/telemetry. Clear liquid diet. Serial H&H. Gastroenterology is following.  Active Problems:   Hyperlipidemia  Currently not on statin. Needs lifestyle modifications    Gout Continue allopurinol    Essential hypertension Continue amlodipine 5 mg p.o. daily. Monitor blood pressure.    Abnormal TSH Continue levothyroxine 25 mcg p.o. daily.    DVT prophylaxis: SCDs. Code Status: Full code. Family Communication: Disposition Plan: Observation for serial H&H.  GI consult. Consults called: Gastroenterology is consulting. Admission status: Observation/telemetry.   Reubin Milan MD [facility]  If 7PM-7AM, please contact  night-coverage [email or web address]  05/29/2019, 6:44 PM   This document was prepared using Dragon voice recognition software and may contain some unintended transcription errors.

## 2019-05-29 NOTE — ED Notes (Signed)
Patient has been cleaned and changed at this time.

## 2019-05-29 NOTE — ED Notes (Signed)
ED TO INPATIENT HANDOFF REPORT  ED Nurse Name and Phone #:   Anderson Malta [phone]  S Name/Age/Gender [name] 85 y.o. female Room/Bed: WA02/WA02  Code Status   Code Status: Full Code  Home/SNF/Other Home Patient oriented to: self, place, time and situation Is this baseline? Yes   Triage Complete: Triage complete  Chief Complaint Hematochezia [K92.1]  Triage Note Patient complains of dark red blood from her rectum with clots. She states this started last night and continued into today. In the past she has had a similar issue in which she had to have a blood transfusion. She currently takes Aspirin. She reports some dizziness and fatigue.     Allergies Allergies  Allergen Reactions  . Ace Inhibitors Other (See Comments)    Tolerates lisinopril at home. Pt does not recall allergy.  . Atorvastatin     REACTION: myalgias  . Oxycodone-Acetaminophen     REACTION: confusion, fatigue  . Rosuvastatin     REACTION: leg weakness  . Simvastatin     REACTION: leg weakness  . Codeine Nausea And Vomiting and Rash  . Sulfonamide Derivatives Hives and Rash    Level of Care/Admitting Diagnosis ED Disposition    ED Disposition Condition Comment   Admit  Hospital Area: [facility] [[id number]]  Level of Care: Telemetry [5]  Admit to tele based on following criteria: Monitor for Ischemic changes  Covid Evaluation: Asymptomatic Screening Protocol (No Symptoms)  Diagnosis: Hematochezia [676720]  Admitting Physician: Reubin Milan [[id number]]  Attending Physician: Reubin Milan [[id number]]  PT Class (Do Not Modify): Observation [104]  PT Acc Code (Do Not Modify): Observation [10022]       B Medical/Surgery History Past Medical History:  Diagnosis Date  . Anemia   . Anxiety   . Atrial flutter ([place])    "sometimes"  . Blood transfusion   . Breast cancer ([place])    right breast  . Bronchitis   . Chronic kidney disease    occ uti's  . CKD (chronic  kidney disease) stage 3, GFR 30-59 ml/min   . Colon polyps    hyperplastic  . CVA (cerebral infarction)   . Diverticulosis   . DVT of leg (deep venous thrombosis) ([name])    left; S/P OR  . Dysrhythmia    dr bensimhon   . Esophageal stricture   . GERD (gastroesophageal reflux disease)   . Gout   . Hiatal hernia    4 cm  . History of radiation therapy 02/15/12-04/02/12   right breast 4680 cGy/26 sessions, right boost=1400cGy /7 sessions  . Hyperlipidemia   . Hypertension   . IBS (irritable bowel syndrome)   . Kidney stones 1990's  . Morbid obesity ([place])   . Osteoarthritis   . Personal history of radiation therapy   . Pneumonia    "walking"  . PONV (postoperative nausea and vomiting)   . Shingles 02/21/2011  . Shortness of breath on exertion   . Skin cancer   . Stroke [facility]) 2002   residual:  "little tingling in palm of left hand"  . UTI (lower urinary tract infection)    "I've had a few"   Past Surgical History:  Procedure Laterality Date  . ABDOMINAL HYSTERECTOMY    . BILE DUCT STENT PLACEMENT  ~ 01/2011  . BREAST LUMPECTOMY  06/29/11   right  . BREAST LUMPECTOMY  06/29/2011   Procedure: BREAST LUMPECTOMY WITH EXCISION OF SENTINEL NODE;  Surgeon: Pedro Earls, MD;  Location:  [facility] OR;  Service: General;  Laterality: Right;  right sentinel node mapping,right sentinel node biopsy, needle localization right breast lumpectomy  . CATARACT EXTRACTION W/ INTRAOCULAR LENS  IMPLANT, BILATERAL  1990's  . CHOLECYSTECTOMY  03/23/11   lap chole   . COLON SURGERY     hole in intestine ,tumors?  . colonic perforation repair  ~ 2000  . [place]  2012  . INCISION AND DRAINAGE ABSCESS N/A 08/12/2013   Procedure: INCISION AND DRAINAGE ABSCESS;  Surgeon: Earnstine Regal, MD;  Location: [facility] ORS;  Service: General;  Laterality: N/A;  . INCISION AND DRAINAGE ABSCESS N/A 08/20/2013   Procedure: INCISION AND DRAINAGE ABSCESS;  Surgeon: Ralene Ok, MD;  Location: [facility];  Service: General;   Laterality: N/A;  . INCISION AND DRAINAGE BREAST ABSCESS     right breast seroma  . IRRIGATION AND DEBRIDEMENT ABSCESS N/A 08/14/2013   Procedure: Debridment of perineal tissue and debridement of perineal wound;  Surgeon: Earnstine Regal, MD;  Location: [facility];  Service: General;  Laterality: N/A;  . IRRIGATION AND DEBRIDEMENT ABSCESS N/A 08/16/2013   Procedure: DRESSING CHANGE AND DEBRIDEMENT OF PERINEUM WOUND;  Surgeon: Earnstine Regal, MD;  Location: [facility];  Service: General;  Laterality: N/A;  . JOINT REPLACEMENT    . MASS EXCISION Right 12/26/2018   Procedure: EXCISION RIGHT ARM SKIN MASS;  Surgeon: Erroll Luna, MD;  Location: [facility];  Service: General;  Laterality: Right;  . MINOR APPLICATION OF WOUND VAC N/A 08/20/2013   Procedure: MINOR APPLICATION OF WOUND VAC;  Surgeon: Ralene Ok, MD;  Location[facility];  Service: General;  Laterality: N/A;  . pelvic bone tumor removal     twice in 2000's  . PILONIDAL CYST DRAINAGE N/A 08/19/2013   Procedure: Dressing change perineum;  Surgeon: Ralene Ok, MD;  Location: [facility];  Service: General;  Laterality: N/A;  . REPLACEMENT TOTAL KNEE BILATERAL  ~ 2008; ~ 2010   right; left  . TONSILLECTOMY  ~ 1946  . TOTAL HIP ARTHROPLASTY  1980's   right     A IV Location/Drains/Wounds Patient Lines/Drains/Airways Status   Active Line/Drains/Airways    Name:   Placement date:   Placement time:   Site:   Days:   Peripheral IV 05/29/19 Left;Distal Forearm   05/29/19    1410    Forearm   less than 1          Intake/Output Last 24 hours No intake or output data in the 24 hours ending 05/29/19 2247  Labs/Imaging Results for orders placed or performed during the hospital encounter of 05/29/19 (from the past 48 hour(s))  Comprehensive metabolic panel     Status: Abnormal   Collection Time: 05/29/19  2:13 PM  Result Value Ref Range   Sodium 143 135 - 145 mmol/L   Potassium 4.5 3.5 - 5.1 mmol/L   Chloride 103 98 - 111 mmol/L    CO2 29 22 - 32 mmol/L   Glucose, Bld 118 (H) 70 - 99 mg/dL   BUN 43 (H) 8 - 23 mg/dL   Creatinine, Ser 1.42 (H) 0.44 - 1.00 mg/dL   Calcium 9.7 8.9 - 10.3 mg/dL   Total Protein 7.8 6.5 - 8.1 g/dL   Albumin 3.9 3.5 - 5.0 g/dL   AST 20 15 - 41 U/L   ALT 16 0 - 44 U/L   Alkaline Phosphatase 94 38 - 126 U/L   Total Bilirubin 0.7 0.3 - 1.2 mg/dL  GFR calc non Af Amer 33 (L) >60 mL/min   GFR calc Af Amer 38 (L) >60 mL/min   Anion gap 11 5 - 15    Comment: Performed at [facility], [place] [address] [place], [place] [address]  CBC     Status: Abnormal   Collection Time: 05/29/19  2:13 PM  Result Value Ref Range   WBC 10.2 4.0 - 10.5 K/uL   RBC 4.44 3.87 - 5.11 MIL/uL   Hemoglobin 12.8 12.0 - 15.0 g/dL   HCT 42.5 36.0 - 46.0 %   MCV 95.7 80.0 - 100.0 fL   MCH 28.8 26.0 - 34.0 pg   MCHC 30.1 30.0 - 36.0 g/dL   RDW 16.4 (H) 11.5 - 15.5 %   Platelets 227 150 - 400 K/uL   nRBC 0.0 0.0 - 0.2 %    Comment: Performed at [facility], [place] [address], [place], [place] [address]  Type and screen [place]     Status: None   Collection Time: 05/29/19  2:13 PM  Result Value Ref Range   ABO/RH(D) O NEG    Antibody Screen POS    Sample Expiration      06/01/2019,2359 Performed at [facility], [place] [address], [place], [place] [address]   Protime-INR - (order if Patient is taking Coumadin / Warfarin)     Status: None   Collection Time: 05/29/19  2:13 PM  Result Value Ref Range   Prothrombin Time 12.1 11.4 - 15.2 seconds   INR 0.9 0.8 - 1.2    Comment: (NOTE) INR goal varies based on device and disease states. Performed at [facility], [place] [address], [place], [place] [address]   SARS CORONAVIRUS 2 (TAT 6-24 HRS) Nasopharyngeal Nasopharyngeal Swab     Status: None   Collection Time: 05/29/19  3:09 PM   Specimen: Nasopharyngeal Swab  Result Value Ref Range   SARS Coronavirus 2 NEGATIVE  NEGATIVE    Comment: (NOTE) SARS-CoV-2 target nucleic acids are NOT DETECTED. The SARS-CoV-2 RNA is generally detectable in upper and lower respiratory specimens during the acute phase of infection. Negative results do not preclude SARS-CoV-2 infection, do not rule out co-infections with other pathogens, and should not be used as the sole basis for treatment or other patient management decisions. Negative results must be combined with clinical observations, patient history, and epidemiological information. The expected result is Negative. Fact Sheet for Patients: SugarRoll.be Fact Sheet for Healthcare Providers: https://www.woods-mathews.com/ This test is not yet approved or cleared by the [place] FDA and  has been authorized for detection and/or diagnosis of SARS-CoV-2 by FDA under an Emergency Use Authorization (EUA). This EUA will remain  in effect (meaning this test can be used) for the duration of the COVID-19 declaration under Section 56 4(b)(1) of the Act, 21 U.S.C. section 360bbb-3(b)(1), unless the authorization is terminated or revoked sooner. Performed at [facility], [place] [address], [place],  [address]    No results found.  Pending Labs Unresulted Labs (From admission, onward)    Start     Ordered   05/30/19 0800  Comprehensive metabolic panel  Once,   STAT     05/29/19 1554   05/30/19 0200  CBC  Once,   STAT     05/29/19 1554   05/29/19 2000  Hemoglobin and hematocrit, blood  Now then every 12 hours,   R (with STAT occurrences)  05/29/19 1554          Vitals/Pain Today's Vitals   05/29/19 2000 05/29/19 2030 05/29/19 2100 05/29/19 2130  BP: (!) 161/72 (!) 157/66 (!) 159/82 (!) 166/64  Pulse:    76  Resp: 20 (!) 22 (!) 24 (!) 22  Temp:      TempSrc:      SpO2:    94%  PainSc:        Isolation Precautions No active isolations  Medications Medications  ondansetron (ZOFRAN) tablet 4 mg  (has no administration in time range)    Or  ondansetron (ZOFRAN) injection 4 mg (has no administration in time range)  acetaminophen (TYLENOL) tablet 650 mg (has no administration in time range)    Or  acetaminophen (TYLENOL) suppository 650 mg (has no administration in time range)    Mobility Walks with device Low fall risk     R Recommendations: See Admitting Provider Note  Report given to:  [place] room 16

## 2019-05-29 NOTE — ED Notes (Signed)
Bedside commode placed.

## 2019-05-29 NOTE — ED Notes (Signed)
Patient has been cleaned and changed.

## 2019-05-30 DIAGNOSIS — Z87442 Personal history of urinary calculi: Secondary | ICD-10-CM | POA: Diagnosis not present

## 2019-05-30 DIAGNOSIS — Z79899 Other long term (current) drug therapy: Secondary | ICD-10-CM | POA: Diagnosis not present

## 2019-05-30 DIAGNOSIS — Z79890 Hormone replacement therapy (postmenopausal): Secondary | ICD-10-CM | POA: Diagnosis not present

## 2019-05-30 DIAGNOSIS — K921 Melena: Secondary | ICD-10-CM | POA: Diagnosis not present

## 2019-05-30 DIAGNOSIS — Z8701 Personal history of pneumonia (recurrent): Secondary | ICD-10-CM | POA: Diagnosis not present

## 2019-05-30 DIAGNOSIS — D62 Acute posthemorrhagic anemia: Secondary | ICD-10-CM | POA: Diagnosis present

## 2019-05-30 DIAGNOSIS — Z20828 Contact with and (suspected) exposure to other viral communicable diseases: Secondary | ICD-10-CM | POA: Diagnosis present

## 2019-05-30 DIAGNOSIS — K5791 Diverticulosis of intestine, part unspecified, without perforation or abscess with bleeding: Secondary | ICD-10-CM | POA: Diagnosis present

## 2019-05-30 DIAGNOSIS — I130 Hypertensive heart and chronic kidney disease with heart failure and stage 1 through stage 4 chronic kidney disease, or unspecified chronic kidney disease: Secondary | ICD-10-CM | POA: Diagnosis present

## 2019-05-30 DIAGNOSIS — Z85828 Personal history of other malignant neoplasm of skin: Secondary | ICD-10-CM | POA: Diagnosis not present

## 2019-05-30 DIAGNOSIS — E039 Hypothyroidism, unspecified: Secondary | ICD-10-CM | POA: Diagnosis present

## 2019-05-30 DIAGNOSIS — Z923 Personal history of irradiation: Secondary | ICD-10-CM | POA: Diagnosis not present

## 2019-05-30 DIAGNOSIS — M1990 Unspecified osteoarthritis, unspecified site: Secondary | ICD-10-CM | POA: Diagnosis present

## 2019-05-30 DIAGNOSIS — E785 Hyperlipidemia, unspecified: Secondary | ICD-10-CM | POA: Diagnosis present

## 2019-05-30 DIAGNOSIS — Z87440 Personal history of urinary (tract) infections: Secondary | ICD-10-CM | POA: Diagnosis not present

## 2019-05-30 DIAGNOSIS — Z853 Personal history of malignant neoplasm of breast: Secondary | ICD-10-CM | POA: Diagnosis not present

## 2019-05-30 DIAGNOSIS — Z7982 Long term (current) use of aspirin: Secondary | ICD-10-CM | POA: Diagnosis not present

## 2019-05-30 DIAGNOSIS — Z86718 Personal history of other venous thrombosis and embolism: Secondary | ICD-10-CM | POA: Diagnosis not present

## 2019-05-30 DIAGNOSIS — Z8673 Personal history of transient ischemic attack (TIA), and cerebral infarction without residual deficits: Secondary | ICD-10-CM | POA: Diagnosis not present

## 2019-05-30 DIAGNOSIS — I5032 Chronic diastolic (congestive) heart failure: Secondary | ICD-10-CM | POA: Diagnosis present

## 2019-05-30 DIAGNOSIS — Z23 Encounter for immunization: Secondary | ICD-10-CM | POA: Diagnosis not present

## 2019-05-30 DIAGNOSIS — N1831 Chronic kidney disease, stage 3a: Secondary | ICD-10-CM | POA: Diagnosis present

## 2019-05-30 DIAGNOSIS — K5731 Diverticulosis of large intestine without perforation or abscess with bleeding: Secondary | ICD-10-CM | POA: Diagnosis not present

## 2019-05-30 DIAGNOSIS — M109 Gout, unspecified: Secondary | ICD-10-CM | POA: Diagnosis present

## 2019-05-30 DIAGNOSIS — Z885 Allergy status to narcotic agent status: Secondary | ICD-10-CM | POA: Diagnosis not present

## 2019-05-30 DIAGNOSIS — K219 Gastro-esophageal reflux disease without esophagitis: Secondary | ICD-10-CM | POA: Diagnosis present

## 2019-05-30 LAB — CBC
HCT: 37.8 % (ref 36.0–46.0)
Hemoglobin: 11.6 g/dL — ABNORMAL LOW (ref 12.0–15.0)
MCH: 29.1 pg (ref 26.0–34.0)
MCHC: 30.7 g/dL (ref 30.0–36.0)
MCV: 94.7 fL (ref 80.0–100.0)
Platelets: 193 10*3/uL (ref 150–400)
RBC: 3.99 MIL/uL (ref 3.87–5.11)
RDW: 16.1 % — ABNORMAL HIGH (ref 11.5–15.5)
WBC: 9.5 10*3/uL (ref 4.0–10.5)
nRBC: 0 % (ref 0.0–0.2)

## 2019-05-30 LAB — COMPREHENSIVE METABOLIC PANEL
ALT: 13 U/L (ref 0–44)
AST: 21 U/L (ref 15–41)
Albumin: 3 g/dL — ABNORMAL LOW (ref 3.5–5.0)
Alkaline Phosphatase: 70 U/L (ref 38–126)
Anion gap: 12 (ref 5–15)
BUN: 39 mg/dL — ABNORMAL HIGH (ref 8–23)
CO2: 29 mmol/L (ref 22–32)
Calcium: 9.1 mg/dL (ref 8.9–10.3)
Chloride: 100 mmol/L (ref 98–111)
Creatinine, Ser: 1.34 mg/dL — ABNORMAL HIGH (ref 0.44–1.00)
GFR calc Af Amer: 41 mL/min — ABNORMAL LOW (ref >60)
GFR calc non Af Amer: 36 mL/min — ABNORMAL LOW (ref >60)
Glucose, Bld: 138 mg/dL — ABNORMAL HIGH (ref 70–99)
Potassium: 4.5 mmol/L (ref 3.5–5.1)
Sodium: 141 mmol/L (ref 135–145)
Total Bilirubin: 0.9 mg/dL (ref 0.3–1.2)
Total Protein: 6.3 g/dL — ABNORMAL LOW (ref 6.5–8.1)

## 2019-05-30 LAB — TYPE AND SCREEN
ABO/RH(D): O NEG
Antibody Screen: POSITIVE

## 2019-05-30 LAB — HEMOGLOBIN AND HEMATOCRIT, BLOOD
HCT: 38.1 % (ref 36.0–46.0)
HCT: 37.1 % (ref 36.0–46.0)
Hemoglobin: 11.3 g/dL — ABNORMAL LOW (ref 12.0–15.0)
Hemoglobin: 11.3 g/dL — ABNORMAL LOW (ref 12.0–15.0)

## 2019-05-30 MED ORDER — BOOST / RESOURCE BREEZE PO LIQD CUSTOM
1.0000 | Freq: Three times a day (TID) | ORAL | Status: DC
  Administered 2019-05-30 – 2019-05-31 (×4): 1 via ORAL

## 2019-05-30 NOTE — Progress Notes (Signed)
Patient had large episode of mushy medium red colored bloody stool on bedpan this morning.Patient denies any pain, dizziness, etc. Will continue to monitor.

## 2019-05-30 NOTE — Progress Notes (Signed)
5E New Admission Note:    Arrival Method: Bed, Weakness When Standing Mental Orientation:  A X O X 4 Telemetry:  Box 52 Skin:  MASD, burises Iv: L FA Pain:  0/10 Tubes:  purewick Safety Measures: Safety Fall Prevention Plan has been given, discussed and signed Admission: Completed [facility] Orientation: Patient has been orientated to the room, unit, and staff.   Orders have been reviewed and implemented. Will continue to monitor the patient. Call light has been placed within reach and bed alarm has been activated.

## 2019-05-30 NOTE — Evaluation (Signed)
Physical Therapy Evaluation Patient Details Name: [name] MRN: [id number] DOB: 1930-04-04 Today's Date: 05/30/2019   History of Present Illness  Pt is 85 y.o. female with medical history significant of anemia, anxiety, atrial flutter, breast cancer, chronic kidney disease, history of CVA, diverticulosis, dysrhythmia, GERD, gout, hyperlipidemia, hypertension, lithiasis, morbid obesity, osteoarthritis, history of pneumonia,  herpes zoster, skin cancer,  nonhemorrhagic stroke who was admitted with heatochezia and per MD likely diverticular bleed but pt declined colonscopy at this time.  Hgb is 11.3.  Clinical Impression  Pt admitted with above diagnosis. Pt was able to transfer with min guard and ambulate 100' with rollator.  She will benefit from PT while hospitalized, but no needs at d/c.  Pt requesting an upright 4 wheel rolling walker to promote- PT agrees as this would promote a more upright posture and allow her to use forearms if needed to support UE.   Pt currently with functional limitations due to the deficits listed below (see PT Problem List). Pt will benefit from skilled PT to increase their independence and safety with mobility to allow discharge to the venue listed below.       Follow Up Recommendations No PT follow up    Equipment Recommendations  Other (comment)(Pt requesting an Upright Rollator to promote standing/posture if covered/partially covered by insurance)    Recommendations for Other Services       Precautions / Restrictions Precautions Precautions: Fall Restrictions Weight Bearing Restrictions: No      Mobility  Bed Mobility Overal bed mobility: Needs Assistance Bed Mobility: Sit to Supine       Sit to supine: Min assist   General bed mobility comments: on BSC at arrival; min A for L leg back into bed  Transfers Overall transfer level: Needs assistance   Transfers: Sit to/from Stand;Stand Pivot Transfers Sit to Stand: Min guard Stand pivot  transfers: Min guard       General transfer comment: performed sit to stand x 3 with cues for safe hand placement; pivoted from bsc to bed with min guard; min A for toielting ADLs (limited due to lines/leads)  Ambulation/Gait Ambulation/Gait assistance: Min guard Gait Distance (Feet): 100 Feet Assistive device: 4-wheeled walker Gait Pattern/deviations: Decreased stride length     General Gait Details: mild instability but no overt LOB; pt leaned on forearms at this time -cued for posture but reports back too sore at this point  Stairs            Wheelchair Mobility    Modified Rankin (Stroke Patients Only)       Balance Overall balance assessment: Needs assistance Sitting-balance support: Single extremity supported;Feet supported Sitting balance-Leahy Scale: Good     Standing balance support: Bilateral upper extremity supported;During functional activity Standing balance-Leahy Scale: Fair                               Pertinent Vitals/Pain Pain Assessment: No/denies pain    Home Living Family/patient expects to be discharged to:: Private residence Living Arrangements: Spouse/significant other Available Help at Discharge: Available 24 hours/day Type of Home: House Home Access: Stairs to enter Entrance Stairs-Rails: Right Entrance Stairs-Number of Steps: 3 Home Layout: One level Home Equipment: Clinical cytogeneticist - 4 wheels Additional Comments: holds onto sink to get in tub;    Prior Function Level of Independence: Independent with assistive device(s)         Comments: Uses rollator; Could ambulate outside and  in community; Independent with ADLs     Hand Dominance        Extremity/Trunk Assessment   Upper Extremity Assessment Upper Extremity Assessment: LUE deficits/detail;RUE deficits/detail RUE Deficits / Details: Rounded shoulders but ROM WFL; MMT 4-/5 LUE Deficits / Details: Rounded shoulders but ROM WFL; MMT 4-/5    Lower  Extremity Assessment Lower Extremity Assessment: Overall WFL for tasks assessed    Cervical / Trunk Assessment Cervical / Trunk Assessment: Kyphotic  Communication   Communication: HOH  Cognition Arousal/Alertness: Awake/alert Behavior During Therapy: WFL for tasks assessed/performed Overall Cognitive Status: Within Functional Limits for tasks assessed                                        General Comments General comments (skin integrity, edema, etc.): vss    Exercises     Assessment/Plan    PT Assessment Patient needs continued PT services  PT Problem List Decreased strength;Decreased mobility;Decreased activity tolerance;Decreased balance;Decreased knowledge of use of DME       PT Treatment Interventions DME instruction;Therapeutic activities;Gait training;Therapeutic exercise;Patient/family education;Stair training;Functional mobility training;Balance training    PT Goals (Current goals can be found in the Care Plan section)  Acute Rehab PT Goals Patient Stated Goal: return home PT Goal Formulation: With patient Time For Goal Achievement: 06/13/19 Potential to Achieve Goals: Good    Frequency Min 3X/week   Barriers to discharge        Co-evaluation               AM-PAC PT "6 Clicks" Mobility  Outcome Measure Help needed turning from your back to your side while in a flat bed without using bedrails?: None Help needed moving from lying on your back to sitting on the side of a flat bed without using bedrails?: A Little Help needed moving to and from a bed to a chair (including a wheelchair)?: None Help needed standing up from a chair using your arms (e.g., wheelchair or bedside chair)?: None Help needed to walk in hospital room?: None Help needed climbing 3-5 steps with a railing? : A Little 6 Click Score: 22    End of Session Equipment Utilized During Treatment: Gait belt Activity Tolerance: Patient tolerated treatment well Patient  left: in bed;with call bell/phone within reach;with bed alarm set(declined SCDs) Nurse Communication: Mobility status PT Visit Diagnosis: Unsteadiness on feet (R26.81);Muscle weakness (generalized) (M62.81)    Time: 1525-1600 PT Time Calculation (min) (ACUTE ONLY): 35 min   Charges:   PT Evaluation $PT Eval Low Complexity: 1 Low PT Treatments $Gait Training: 8-22 mins        Maggie Font, PT Acute Rehab Services Pager [phone] [facility] [phone] [place] [phone]   Karlton Lemon 05/30/2019, 5:26 PM

## 2019-05-30 NOTE — Progress Notes (Signed)
PROGRESS NOTE    [name]  UMP:[id number] DOB: August 29, 1929 DOA: 05/29/2019 PCP: Biagio Borg, MD   Brief Narrative:  Patient is 43 female with history of anemia, anxiety, atrial flutter, breast cancer, CKD, CVA, diverticulosis, hyperlipidemia, hypertension, history of skin cancer, nonhemorrhagic stroke who presents to the emergency department with complaints of multiple bloody bowel movements.  She denies abdomen pain, nausea or vomiting.  She was taking aspirin 325 mg every other day.  She has history of hematochezia in the past.  GI consulted today.  Currently her H&H is stable.  She had another bowel movement last night which was bloody.   Assessment & Plan:   Principal Problem:   Hematochezia Active Problems:   Hyperlipidemia   Gout   Essential hypertension   Lower GI bleed   Abnormal TSH   Painless hematochezia: Most likely associated with diverticular bleed.  Hemoglobin remains stable.  Denies any abdominal pain, nausea or vomiting.  Takes aspirin 325 mg every other day at home. She has history of hematochezia in 2016 when she was admitted here and was suspected to have diverticular bleed.  GI following.  Plan is to continue monitoring her CBC as patient denied colonoscopy.  Hypothyroidism: Continue Synthyroid  Hypertension: Currently above stable.  Continue current medicines  Gout: On allopurinol  CKD stage IIIa: Currently stable and is at baseline.  Breast cancer/skin cancer, status post lumpectomy of the right breast.  Currently in remission.  History of skin cancer.  Status post excision.         DVT prophylaxis:SCD Code Status: Full Family Communication: Called husband on phone  Disposition Plan: Home when hemoglobin remains stable  Consultants: GI  Procedures: None  Antimicrobials:  Anti-infectives (From admission, onward)   None      Subjective: Patient seen and examined at the bedside this morning.  Hemodynamically stable.  Comfortable.   Denies any abdomen pain, nausea or vomiting.  Had a bloody bowel movement last night.  Objective: Vitals:   05/29/19 2314 05/29/19 2336 05/30/19 0555 05/30/19 1329  BP: (!) 158/70  (!) 163/77 (!) 149/56  Pulse: 79  74 76  Resp: 20  20 20   Temp: 98.6 F (37 C)  98.6 F (37 C) 98.2 F (36.8 C)  TempSrc: Oral  Oral Oral  SpO2: 95%  94% 95%  Weight:  97.9 kg    Height:  5\' 5"  (1.651 m)      Intake/Output Summary (Last 24 hours) at 05/30/2019 1359 Last data filed at 05/30/2019 0930 Gross per 24 hour  Intake 360 ml  Output --  Net 360 ml   Filed Weights   05/29/19 2336  Weight: 97.9 kg    Examination:  General exam: Appears calm and comfortable , pleasant elderly female ,HOH HEENT:PERRL,Oral mucosa moist, Ear/Nose normal on gross exam Respiratory system: Bilateral equal air entry, normal vesicular breath sounds, no wheezes or crackles  Cardiovascular system: S1 & S2 heard, RRR. No JVD, murmurs, rubs, gallops or clicks. No pedal edema. Gastrointestinal system: Abdomen is nondistended, soft and nontender. No organomegaly or masses felt. Normal bowel sounds heard. Central nervous system: Alert and oriented. No focal neurological deficits. Extremities: No edema, no clubbing ,no cyanosis Skin: No rashes, lesions or ulcers,no icterus ,no pallor    Data Reviewed: I have personally reviewed following labs and imaging studies  CBC: Recent Labs  Lab 05/29/19 1413 05/29/19 2312 05/30/19 0228 05/30/19 0755  WBC 10.2  --  9.5  --   HGB 12.8  12.3 11.6* 11.3*  HCT 42.5 39.8 37.8 38.1  MCV 95.7  --  94.7  --   PLT 227  --  193  --    Basic Metabolic Panel: Recent Labs  Lab 05/29/19 1413 05/30/19 0755  NA 143 141  K 4.5 4.5  CL 103 100  CO2 29 29  GLUCOSE 118* 138*  BUN 43* 39*  CREATININE 1.42* 1.34*  CALCIUM 9.7 9.1   GFR: Estimated Creatinine Clearance: 34.3 mL/min (A) (by C-G formula based on SCr of 1.34 mg/dL (H)). Liver Function Tests: Recent Labs  Lab  05/29/19 1413 05/30/19 0755  AST 20 21  ALT 16 13  ALKPHOS 94 70  BILITOT 0.7 0.9  PROT 7.8 6.3*  ALBUMIN 3.9 3.0*   No results for input(s): LIPASE, AMYLASE in the last 168 hours. No results for input(s): AMMONIA in the last 168 hours. Coagulation Profile: Recent Labs  Lab 05/29/19 1413  INR 0.9   Cardiac Enzymes: No results for input(s): CKTOTAL, CKMB, CKMBINDEX, TROPONINI in the last 168 hours. BNP (last 3 results) No results for input(s): PROBNP in the last 8760 hours. HbA1C: No results for input(s): HGBA1C in the last 72 hours. CBG: No results for input(s): GLUCAP in the last 168 hours. Lipid Profile: No results for input(s): CHOL, HDL, LDLCALC, TRIG, CHOLHDL, LDLDIRECT in the last 72 hours. Thyroid Function Tests: No results for input(s): TSH, T4TOTAL, FREET4, T3FREE, THYROIDAB in the last 72 hours. Anemia Panel: No results for input(s): VITAMINB12, FOLATE, FERRITIN, TIBC, IRON, RETICCTPCT in the last 72 hours. Sepsis Labs: No results for input(s): PROCALCITON, LATICACIDVEN in the last 168 hours.  Recent Results (from the past 240 hour(s))  SARS CORONAVIRUS 2 (TAT 6-24 HRS) Nasopharyngeal Nasopharyngeal Swab     Status: None   Collection Time: 05/29/19  3:09 PM   Specimen: Nasopharyngeal Swab  Result Value Ref Range Status   SARS Coronavirus 2 NEGATIVE NEGATIVE Final    Comment: (NOTE) SARS-CoV-2 target nucleic acids are NOT DETECTED. The SARS-CoV-2 RNA is generally detectable in upper and lower respiratory specimens during the acute phase of infection. Negative results do not preclude SARS-CoV-2 infection, do not rule out co-infections with other pathogens, and should not be used as the sole basis for treatment or other patient management decisions. Negative results must be combined with clinical observations, patient history, and epidemiological information. The expected result is Negative. Fact Sheet for  Patients: SugarRoll.be Fact Sheet for Healthcare Providers: https://www.woods-mathews.com/ This test is not yet approved or cleared by the [place] FDA and  has been authorized for detection and/or diagnosis of SARS-CoV-2 by FDA under an Emergency Use Authorization (EUA). This EUA will remain  in effect (meaning this test can be used) for the duration of the COVID-19 declaration under Section 56 4(b)(1) of the Act, 21 U.S.C. section 360bbb-3(b)(1), unless the authorization is terminated or revoked sooner. Performed at [facility], [place] [address], [place], [place] [address]          Radiology Studies: No results found.      Scheduled Meds: . amLODipine  5 mg Oral Daily  . feeding supplement  1 Container Oral TID BM  . influenza vaccine adjuvanted  0.5 mL Intramuscular Tomorrow-1000   Continuous Infusions:   LOS: 0 days    Time spent: 25 mins.More than 50% of that time was spent in counseling and/or coordination of care.      Shelly Coss, MD [facility] Pager [phone]  If 7PM-7AM, please contact  night-coverage [email or web address] Password [email or web address] 05/30/2019, 1:59 PM

## 2019-05-30 NOTE — Progress Notes (Signed)
    Progress Note   Subjective  Chief Complaint: Hematochezia (suspected diverticular bleed)  This morning, the patient tells [place] that she is doing well.  Per nursing she did have 1 medium red-colored bloody stool overnight, but none since.   Objective   Vital signs in last 24 hours: Temp:  [97.7 F (36.5 C)-98.6 F (37 C)] 98.6 F (37 C) (12/17 0555) Pulse Rate:  [48-90] 74 (12/17 0555) Resp:  [16-25] 20 (12/17 0555) BP: (143-178)/(57-89) 163/77 (12/17 0555) SpO2:  [91 %-98 %] 94 % (12/17 0555) Weight:  [97.9 kg] 97.9 kg (12/16 2336) Last BM Date: 05/29/19 General:    Elderly white female in NAD Heart:  Regular rate and rhythm; no murmurs Lungs: Respirations even and unlabored, lungs CTA bilaterally Abdomen:  Soft, nontender and nondistended. Normal bowel sounds. Extremities:  Without edema. Neurologic:  Alert and oriented,  grossly normal neurologically. Psych:  Cooperative. Normal mood and affect.  Intake/Output this shift: Total I/O In: 360 [P.O.:360] Out: -   Lab Results: Recent Labs    05/29/19 1413 05/29/19 2312 05/30/19 0228 05/30/19 0755  WBC 10.2  --  9.5  --   HGB 12.8 12.3 11.6* 11.3*  HCT 42.5 39.8 37.8 38.1  PLT 227  --  193  --    BMET Recent Labs    05/29/19 1413 05/30/19 0755  NA 143 141  K 4.5 4.5  CL 103 100  CO2 29 29  GLUCOSE 118* 138*  BUN 43* 39*  CREATININE 1.42* 1.34*  CALCIUM 9.7 9.1   LFT Recent Labs    05/30/19 0755  PROT 6.3*  ALBUMIN 3.0*  AST 21  ALT 13  ALKPHOS 70  BILITOT 0.9   PT/INR Recent Labs    05/29/19 1413  LABPROT 12.1  INR 0.9    Assessment / Plan:   Assessment: 1.  Hematochezia: 1 further bloody bowel movement last night, none today, again most likely diverticular bleed which is hopefully stopping on its own 2.  Acute blood loss anemia: Related to above, minimal drop in hemoglobin from 12.8--> 11.3  Plan: 1.  Again discussed possible colonoscopy with the patient but she would prefer not to do  this as she had a perforation during her last one. 2.  We will continue to observe the patient today and monitor hemoglobin every 8 hours with transfusion as needed less than 7 3.  Please await any further recommendations from Dr. Bryan Lemma later today  Thank you for your kind consultation, we will continue to follow.    LOS: 0 days   Levin Erp  05/30/2019, 11:23 AM

## 2019-05-30 NOTE — Progress Notes (Signed)
Initial Nutrition Assessment  DOCUMENTATION CODES:   Obesity unspecified  INTERVENTION:   -Boost Breeze po TID, each supplement provides 250 kcal and 9 grams of protein  NUTRITION DIAGNOSIS:   Inadequate oral intake related to acute illness, poor appetite as evidenced by per patient/family report(clear liquid diet).  GOAL:   Patient will meet greater than or equal to 90% of their needs  MONITOR:   PO intake, Supplement acceptance, Labs, Weight trends, I & O's, Diet advancement  REASON FOR ASSESSMENT:   Malnutrition Screening Tool    ASSESSMENT:   66 female with history of anemia, anxiety, atrial flutter, breast cancer, CKD, CVA, diverticulosis, hyperlipidemia, hypertension, history of skin cancer, nonhemorrhagic stroke who presents to the emergency department with complaints of multiple bloody bowel movements.  **RD working remotely**  Patient currently on a clear liquid given pt was having bloody stools PTA. Pt consumed 50% of her breakfast tray. Boost Breeze supplement have been ordered for patient, so far accepting.  Per weight records, pt has lost 5 lbs since 8/4, insignificant for time frame.   Labs reviewed. Medications reviewed.  NUTRITION - FOCUSED PHYSICAL EXAM:  Working remotely  Diet Order:   Diet Order            Diet clear liquid Room service appropriate? Yes; Fluid consistency: Thin  Diet effective now              EDUCATION NEEDS:   No education needs have been identified at this time  Skin:  Skin Assessment: Reviewed RN Assessment  Last BM:  12/17 -type 6  Height:   Ht Readings from Last 1 Encounters:  05/29/19 5\' 5"  (1.651 m)    Weight:   Wt Readings from Last 1 Encounters:  05/29/19 97.9 kg    Ideal Body Weight:  56.8 kg  BMI:  Body mass index is 35.93 kg/m.  Estimated Nutritional Needs:   Kcal:  1650-1850  Protein:  65-75g  Fluid:  1.8L/day  Clayton Bibles, MS, RD, LDN Inpatient Clinical Dietitian Pager:  [phone] After Hours Pager: [phone]

## 2019-05-31 DIAGNOSIS — Z23 Encounter for immunization: Secondary | ICD-10-CM | POA: Diagnosis not present

## 2019-05-31 DIAGNOSIS — K5731 Diverticulosis of large intestine without perforation or abscess with bleeding: Secondary | ICD-10-CM

## 2019-05-31 LAB — HEMOGLOBIN AND HEMATOCRIT, BLOOD
HCT: 37.5 % (ref 36.0–46.0)
Hemoglobin: 11.1 g/dL — ABNORMAL LOW (ref 12.0–15.0)

## 2019-05-31 NOTE — Progress Notes (Signed)
    Progress Note   Subjective  Chief Complaint: Hematochezia (suspected diverticular bleed)  Per nursing patient has had no further bleeding over the past 24 hours, her last bleed was on the evening of 05/29/2019.  Hemoglobin is remained stable overnight.  Patient tells me she has no complaints or concerns.  She is ready to go home.   Objective   Vital signs in last 24 hours: Temp:  [98 F (36.7 C)-98.4 F (36.9 C)] 98 F (36.7 C) (12/18 0415) Pulse Rate:  [69-76] 69 (12/18 0415) Resp:  [19-20] 19 (12/18 0415) BP: (136-156)/(56-66) 136/59 (12/18 0415) SpO2:  [94 %-97 %] 97 % (12/18 0415) Last BM Date: 05/30/19 General:    Elderly white female in NAD Heart:  Regular rate and rhythm; no murmurs Lungs: Respirations even and unlabored, lungs CTA bilaterally Abdomen:  Soft, nontender and nondistended. Normal bowel sounds. Extremities:  Without edema. Neurologic:  Alert and oriented,  grossly normal neurologically. Psych:  Cooperative. Normal mood and affect.  Intake/Output from previous day: 12/17 0701 - 12/18 0700 In: 870 [P.O.:870] Out: -   Lab Results: Recent Labs    05/29/19 1413 05/30/19 0228 05/30/19 0755 05/30/19 1939 05/31/19 0812  WBC 10.2 9.5  --   --   --   HGB 12.8 11.6* 11.3* 11.3* 11.1*  HCT 42.5 37.8 38.1 37.1 37.5  PLT 227 193  --   --   --    BMET Recent Labs    05/29/19 1413 05/30/19 0755  NA 143 141  K 4.5 4.5  CL 103 100  CO2 29 29  GLUCOSE 118* 138*  BUN 43* 39*  CREATININE 1.42* 1.34*  CALCIUM 9.7 9.1   LFT Recent Labs    05/30/19 0755  PROT 6.3*  ALBUMIN 3.0*  AST 21  ALT 13  ALKPHOS 70  BILITOT 0.9   PT/INR Recent Labs    05/29/19 1413  LABPROT 12.1  INR 0.9     Assessment / Plan:   Assessment: 1.  Hematochezia: Last bloody bowel movement 05/29/2019 p.m., none in the past 24 hours, hemoglobin stable; again most likely diverticular bleeding 2.  Acute blood loss anemia: With above  Plan: 1.  Okay with patient  discharge today.  Did advise her that if she sees any further bleeding, starts with abdominal pain becomes dizzy or short of breath then would recommend that she return 2.  Can resume a regular diet 3.  Discussed discharge with patient's hospitalist team  Thank you for your kind consultation, we will sign off.   LOS: 1 day   Levin Erp  05/31/2019, 9:10 AM

## 2019-05-31 NOTE — Discharge Summary (Signed)
Physician Discharge Summary  [name] RWE:[id number] DOB: September 22, 1929 DOA: 05/29/2019  PCP: Biagio Borg, MD  Admit date: 05/29/2019 Discharge date: 05/31/2019  Admitted From: Home Disposition:  Home  Discharge Condition:Stable CODE STATUS:FULL Diet recommendation: Heart Healthy   Brief/Interim Summary:  Patient is 22 female with history of anemia, anxiety, atrial flutter, breast cancer, CKD, CVA, diverticulosis, hyperlipidemia, hypertension, history of skin cancer, nonhemorrhagic stroke who presents to the emergency department with complaints of multiple bloody bowel movements.  She denies abdomen pain, nausea or vomiting.  She was taking aspirin 325 mg every other day.  She has history of hematochezia in the past.  GI consulted and following.  She did not want to have any procedures like EGD or colonoscopy.Currently her H&H is stable.  She had a small bloody stool last night but her hemoglobin is stable today.  She does not have any active bleeding.  GI cleared her for discharge.  Following problems were addressed during her hospitalization:  Painless hematochezia: Most likely associated with diverticular bleed.  Hemoglobin remains stable.  Denies any abdominal pain, nausea or vomiting.  Takes aspirin 325 mg every other day at home. She has history of hematochezia in 2016 when she was admitted here and was suspected to have diverticular bleed.  GI was following.  Plan is to continue monitoring her CBC as patient denied colonoscopy.  Hypothyroidism: Continue Synthyroid  Hypertension: C Continue current medicines  Gout: On allopurinol  CKD stage IIIa: Currently stable and is at baseline.  Breast cancer/skin cancer, status post lumpectomy of the right breast.  Currently in remission.  History of skin cancer.  Status post excision.   Discharge Diagnoses:  Principal Problem:   Hematochezia Active Problems:   Hyperlipidemia   Gout   Essential hypertension   Lower GI  bleed   Abnormal TSH    Discharge Instructions  Discharge Instructions    Diet - low sodium heart healthy   Complete by: As directed    Discharge instructions   Complete by: As directed    1)Please follow up with your PCP in a week.  Do a CBC test during the follow-up.   Increase activity slowly   Complete by: As directed      Allergies as of 05/31/2019      Reactions   Ace Inhibitors Other (See Comments)   Tolerates lisinopril at home. Pt does not recall allergy.   Atorvastatin    REACTION: myalgias   Oxycodone-acetaminophen    REACTION: confusion, fatigue   Rosuvastatin    REACTION: leg weakness   Simvastatin    REACTION: leg weakness   Codeine Nausea And Vomiting, Rash   Sulfonamide Derivatives Hives, Rash      Medication List    STOP taking these medications   doxycycline 100 MG tablet Commonly known as: VIBRA-TABS   levothyroxine 25 MCG tablet Commonly known as: SYNTHROID     TAKE these medications   acetaminophen 325 MG tablet Commonly known as: TYLENOL Take 650 mg by mouth every 6 (six) hours as needed (arthritis).   allopurinol 100 MG tablet Commonly known as: ZYLOPRIM TAKE 2 TABLETS BY MOUTH EVERY OTHER DAY AND 1 TABLET EVERY OTHER DAY What changed: See the new instructions.   amLODipine 5 MG tablet Commonly known as: NORVASC TAKE 1 TABLET BY MOUTH ONCE A DAY   aspirin 325 MG EC tablet Take 1 tablet (325 mg total) by mouth every other day.   cholecalciferol 1000 units tablet Commonly known as: VITAMIN  D Take 1,000 Units by mouth every morning.   furosemide 40 MG tablet Commonly known as: LASIX TAKE 1 TABLET BY MOUTH EVERY MORNING. What changed: when to take this   lisinopril 20 MG tablet Commonly known as: ZESTRIL TAKE 2 TABLETS BY MOUTH ONCE DAILY   Santyl ointment Generic drug: collagenase Apply 1 application topically daily.      Follow-up Information    Biagio Borg, MD. Schedule an appointment as soon as possible for a  visit in 1 week(s).   Specialties: Internal Medicine, Radiology Contact information: [address] [place] [address] [phone]          Allergies  Allergen Reactions  . Ace Inhibitors Other (See Comments)    Tolerates lisinopril at home. Pt does not recall allergy.  . Atorvastatin     REACTION: myalgias  . Oxycodone-Acetaminophen     REACTION: confusion, fatigue  . Rosuvastatin     REACTION: leg weakness  . Simvastatin     REACTION: leg weakness  . Codeine Nausea And Vomiting and Rash  . Sulfonamide Derivatives Hives and Rash    Consultations:  gastroenterology   Procedures/Studies:  No results found.    Subjective: Patient seen and examined at bedside this morning.  Hemodynamically stable.  Comfortable.  Had a small bloody bowel last night but no bleeding this morning. Stable for discharge.  Discharge Exam: Vitals:   05/30/19 2008 05/31/19 0415  BP: (!) 156/66 (!) 136/59  Pulse: 76 69  Resp: 20 19  Temp: 98.4 F (36.9 C) 98 F (36.7 C)  SpO2: 94% 97%   Vitals:   05/30/19 0555 05/30/19 1329 05/30/19 2008 05/31/19 0415  BP: (!) 163/77 (!) 149/56 (!) 156/66 (!) 136/59  Pulse: 74 76 76 69  Resp: 20 20 20 19   Temp: 98.6 F (37 C) 98.2 F (36.8 C) 98.4 F (36.9 C) 98 F (36.7 C)  TempSrc: Oral Oral    SpO2: 94% 95% 94% 97%  Weight:      Height:        General: Pt is alert, awake, not in acute distress Cardiovascular: RRR, S1/S2 +, no rubs, no gallops Respiratory: CTA bilaterally, no wheezing, no rhonchi Abdominal: Soft, NT, ND, bowel sounds + Extremities: no edema, no cyanosis    The results of significant diagnostics from this hospitalization (including imaging, microbiology, ancillary and laboratory) are listed below for reference.     Microbiology: Recent Results (from the past 240 hour(s))  SARS CORONAVIRUS 2 (TAT 6-24 HRS) Nasopharyngeal Nasopharyngeal Swab     Status: None   Collection Time: 05/29/19  3:09 PM    Specimen: Nasopharyngeal Swab  Result Value Ref Range Status   SARS Coronavirus 2 NEGATIVE NEGATIVE Final    Comment: (NOTE) SARS-CoV-2 target nucleic acids are NOT DETECTED. The SARS-CoV-2 RNA is generally detectable in upper and lower respiratory specimens during the acute phase of infection. Negative results do not preclude SARS-CoV-2 infection, do not rule out co-infections with other pathogens, and should not be used as the sole basis for treatment or other patient management decisions. Negative results must be combined with clinical observations, patient history, and epidemiological information. The expected result is Negative. Fact Sheet for Patients: SugarRoll.be Fact Sheet for Healthcare Providers: https://www.woods-mathews.com/ This test is not yet approved or cleared by the [place] FDA and  has been authorized for detection and/or diagnosis of SARS-CoV-2 by FDA under an Emergency Use Authorization (EUA). This EUA will remain  in effect (meaning  this test can be used) for the duration of the COVID-19 declaration under Section 56 4(b)(1) of the Act, 21 U.S.C. section 360bbb-3(b)(1), unless the authorization is terminated or revoked sooner. Performed at [facility], [place] [address], [place], [place] [address]      Labs: BNP (last 3 results) No results for input(s): BNP in the last 8760 hours. Basic Metabolic Panel: Recent Labs  Lab 05/29/19 1413 05/30/19 0755  NA 143 141  K 4.5 4.5  CL 103 100  CO2 29 29  GLUCOSE 118* 138*  BUN 43* 39*  CREATININE 1.42* 1.34*  CALCIUM 9.7 9.1   Liver Function Tests: Recent Labs  Lab 05/29/19 1413 05/30/19 0755  AST 20 21  ALT 16 13  ALKPHOS 94 70  BILITOT 0.7 0.9  PROT 7.8 6.3*  ALBUMIN 3.9 3.0*   No results for input(s): LIPASE, AMYLASE in the last 168 hours. No results for input(s): AMMONIA in the last 168 hours. CBC: Recent Labs  Lab 05/29/19 1413  05/29/19 2312 05/30/19 0228 05/30/19 0755 05/30/19 1939 05/31/19 0812  WBC 10.2  --  9.5  --   --   --   HGB 12.8 12.3 11.6* 11.3* 11.3* 11.1*  HCT 42.5 39.8 37.8 38.1 37.1 37.5  MCV 95.7  --  94.7  --   --   --   PLT 227  --  193  --   --   --    Cardiac Enzymes: No results for input(s): CKTOTAL, CKMB, CKMBINDEX, TROPONINI in the last 168 hours. BNP: Invalid input(s): POCBNP CBG: No results for input(s): GLUCAP in the last 168 hours. D-Dimer No results for input(s): DDIMER in the last 72 hours. Hgb A1c No results for input(s): HGBA1C in the last 72 hours. Lipid Profile No results for input(s): CHOL, HDL, LDLCALC, TRIG, CHOLHDL, LDLDIRECT in the last 72 hours. Thyroid function studies No results for input(s): TSH, T4TOTAL, T3FREE, THYROIDAB in the last 72 hours.  Invalid input(s): FREET3 Anemia work up No results for input(s): VITAMINB12, FOLATE, FERRITIN, TIBC, IRON, RETICCTPCT in the last 72 hours. Urinalysis    Component Value Date/Time   COLORURINE YELLOW 05/13/2019 1349   APPEARANCEUR Sl Cloudy (A) 05/13/2019 1349   LABSPEC 1.015 05/13/2019 1349   PHURINE 5.5 05/13/2019 1349   GLUCOSEU NEGATIVE 05/13/2019 1349   HGBUR NEGATIVE 05/13/2019 1349   BILIRUBINUR NEGATIVE 05/13/2019 1349   BILIRUBINUR neg 12/09/2014 1506   KETONESUR NEGATIVE 05/13/2019 1349   PROTEINUR neg 12/09/2014 1506   PROTEINUR NEGATIVE 06/18/2014 0334   UROBILINOGEN 0.2 05/13/2019 1349   NITRITE NEGATIVE 05/13/2019 1349   LEUKOCYTESUR SMALL (A) 05/13/2019 1349   Sepsis Labs Invalid input(s): PROCALCITONIN,  WBC,  LACTICIDVEN Microbiology Recent Results (from the past 240 hour(s))  SARS CORONAVIRUS 2 (TAT 6-24 HRS) Nasopharyngeal Nasopharyngeal Swab     Status: None   Collection Time: 05/29/19  3:09 PM   Specimen: Nasopharyngeal Swab  Result Value Ref Range Status   SARS Coronavirus 2 NEGATIVE NEGATIVE Final    Comment: (NOTE) SARS-CoV-2 target nucleic acids are NOT DETECTED. The  SARS-CoV-2 RNA is generally detectable in upper and lower respiratory specimens during the acute phase of infection. Negative results do not preclude SARS-CoV-2 infection, do not rule out co-infections with other pathogens, and should not be used as the sole basis for treatment or other patient management decisions. Negative results must be combined with clinical observations, patient history, and epidemiological information. The expected result is Negative. Fact Sheet for Patients: SugarRoll.be  Fact Sheet for Healthcare Providers: https://www.woods-mathews.com/ This test is not yet approved or cleared by the [place] FDA and  has been authorized for detection and/or diagnosis of SARS-CoV-2 by FDA under an Emergency Use Authorization (EUA). This EUA will remain  in effect (meaning this test can be used) for the duration of the COVID-19 declaration under Section 56 4(b)(1) of the Act, 21 U.S.C. section 360bbb-3(b)(1), unless the authorization is terminated or revoked sooner. Performed at [facility], [place] [address] [place], [place] [address]     Please note: You were cared for by a hospitalist during your hospital stay. Once you are discharged, your primary care physician will handle any further medical issues. Please note that NO REFILLS for any discharge medications will be authorized once you are discharged, as it is imperative that you return to your primary care physician (or establish a relationship with a primary care physician if you do not have one) for your post hospital discharge needs so that they can reassess your need for medications and monitor your lab values.    Time coordinating discharge: 40 minutes  SIGNED:   Shelly Coss, MD  [facility] 05/31/2019, 10:51 AM Pager [phone]  If 7PM-7AM, please contact night-coverage [email or web address] Password [email or web address]

## 2019-06-03 ENCOUNTER — Telehealth: Payer: Self-pay | Admitting: *Deleted

## 2019-06-03 NOTE — Telephone Encounter (Signed)
Transition Care Management Follow-up Telephone Call   Date discharged? 05/31/19   How have you been since you were released from the hospital? Pt states she is doing alright   Do you understand why you were in the hospital? YES   Do you understand the discharge instructions? YES   Where were you discharged to? Home   Items Reviewed:  Medications reviewed: YES, she states her medications are the same  Allergies reviewed: YES  Dietary changes reviewed: NO  Referrals reviewed: No referral recommeded   Functional Questionnaire:   Activities of Daily Living (ADLs):   She states she are independent in the following: ambulation, bathing and hygiene, feeding, continence, grooming, toileting and dressing States she doesn't require assistance    Any transportation issues/concerns?: NO   Any patient concerns? NO   Confirmed importance and date/time of follow-up visits scheduled YES, appt 06/12/19  Provider Appointment booked with Dr. Jenny Reichmann  Confirmed with patient if condition begins to worsen call PCP or go to the ER.  Patient was given the office number and encouraged to call back with question or concerns.  : YES

## 2019-06-11 DIAGNOSIS — M2041 Other hammer toe(s) (acquired), right foot: Secondary | ICD-10-CM | POA: Diagnosis not present

## 2019-06-11 DIAGNOSIS — M2042 Other hammer toe(s) (acquired), left foot: Secondary | ICD-10-CM | POA: Diagnosis not present

## 2019-06-11 DIAGNOSIS — M205X2 Other deformities of toe(s) (acquired), left foot: Secondary | ICD-10-CM | POA: Diagnosis not present

## 2019-06-11 DIAGNOSIS — S91105A Unspecified open wound of left lesser toe(s) without damage to nail, initial encounter: Secondary | ICD-10-CM | POA: Diagnosis not present

## 2019-06-12 ENCOUNTER — Ambulatory Visit (INDEPENDENT_AMBULATORY_CARE_PROVIDER_SITE_OTHER): Payer: Medicare Other | Admitting: Internal Medicine

## 2019-06-12 ENCOUNTER — Other Ambulatory Visit: Payer: Self-pay

## 2019-06-12 ENCOUNTER — Encounter: Payer: Self-pay | Admitting: Internal Medicine

## 2019-06-12 VITALS — BP 136/72 | HR 76 | Temp 97.7°F | Resp 12 | Ht 65.0 in | Wt 217.0 lb

## 2019-06-12 DIAGNOSIS — K922 Gastrointestinal hemorrhage, unspecified: Secondary | ICD-10-CM

## 2019-06-12 DIAGNOSIS — K921 Melena: Secondary | ICD-10-CM

## 2019-06-12 LAB — CBC WITH DIFFERENTIAL/PLATELET
Basophils Absolute: 0.1 10*3/uL (ref 0.0–0.1)
Basophils Relative: 0.6 % (ref 0.0–3.0)
Eosinophils Absolute: 0.2 10*3/uL (ref 0.0–0.7)
Eosinophils Relative: 2.3 % (ref 0.0–5.0)
HCT: 38.3 % (ref 36.0–46.0)
Hemoglobin: 12.1 g/dL (ref 12.0–15.0)
Lymphocytes Relative: 21.6 % (ref 12.0–46.0)
Lymphs Abs: 2.1 10*3/uL (ref 0.7–4.0)
MCHC: 31.7 g/dL (ref 30.0–36.0)
MCV: 90.7 fl (ref 78.0–100.0)
Monocytes Absolute: 0.8 10*3/uL (ref 0.1–1.0)
Monocytes Relative: 8.6 % (ref 3.0–12.0)
Neutro Abs: 6.5 10*3/uL (ref 1.4–7.7)
Neutrophils Relative %: 66.9 % (ref 43.0–77.0)
Platelets: 271 10*3/uL (ref 150.0–400.0)
RBC: 4.22 Mil/uL (ref 3.87–5.11)
RDW: 17.1 % — ABNORMAL HIGH (ref 11.5–15.5)
WBC: 9.7 10*3/uL (ref 4.0–10.5)

## 2019-06-12 NOTE — Patient Instructions (Addendum)
Please continue all other medications as before, and refills have been done if requested.  Please have the pharmacy call with any other refills you may need.  Please continue your efforts at being more active, low cholesterol diet, and weight control.  Please keep your appointments with your specialists as you may have planned  Please go to the LAB at the blood drawing area for the tests to be done  You will be contacted by phone if any changes need to be made immediately.  Otherwise, you will receive a letter about your results with an explanation, but please check with MyChart first.  Please remember to sign up for MyChart if you have not done so, as this will be important to you in the future with finding out test results, communicating by private email, and scheduling acute appointments online when needed.  Please return in 3 months for your yearly visit, or sooner if needed

## 2019-06-12 NOTE — Assessment & Plan Note (Signed)
stable overall by history and exam, recent data reviewed with pt, and pt to continue medical treatment as before,  to f/u any worsening symptoms or concerns. For f/u lab today

## 2019-06-12 NOTE — Progress Notes (Signed)
Subjective:    Patient ID: [name], female    DOB: 11-30-1929, 85 y.o.   MRN: [id number]  HPI  Here after hospn dec 16-18 with several episodes BRB and darker stools; 48 female with history of anemia, anxiety, atrial flutter, breast cancer, CKD, CVA, diverticulosis, hyperlipidemia, hypertension, history of skin cancer, nonhemorrhagic stroke who presented to the emergency department with complaints of multiple bloody bowel movements.She denies abdomen pain, nausea or vomiting. She was taking aspirin 325 mg every other day. She has history of hematochezia in the past. GI consulted.  She did not want to have any procedures like EGD or colonoscopy. Her H&H was stable.  She had a small bloody stool last night but her hemoglobin is stable today.  She does not have any active bleeding.  GI cleared her for discharge.  Since d/c pt denies further bleeding.  Pt denies chest pain, increased sob or doe, wheezing, orthopnea, PND, increased LE swelling, palpitations, dizziness or syncope.  Pt denies new neurological symptoms such as new headache, or facial or extremity weakness or numbness   Pt denies polydipsia, polyuria, Past Medical History:  Diagnosis Date  . Anemia   . Anxiety   . Atrial flutter ([place])    "sometimes"  . Blood transfusion   . Breast cancer ([place]    right breast  . Bronchitis   . Chronic kidney disease    occ uti's  . CKD (chronic kidney disease) stage 3, GFR 30-59 ml/min   . Colon polyps    hyperplastic  . CVA (cerebral infarction)   . Diverticulosis   . DVT of leg (deep venous thrombosis) ([name])    left; S/P OR  . Dysrhythmia    dr bensimhon   . Esophageal stricture   . GERD (gastroesophageal reflux disease)   . Gout   . Hiatal hernia    4 cm  . History of radiation therapy 02/15/12-04/02/12   right breast 4680 cGy/26 sessions, right boost=1400cGy /7 sessions  . Hyperlipidemia   . Hypertension   . IBS (irritable bowel syndrome)   . Kidney stones 1990's  . Morbid  obesity ([place])   . Osteoarthritis   . Personal history of radiation therapy   . Pneumonia    "walking"  . PONV (postoperative nausea and vomiting)   . Shingles 02/21/2011  . Shortness of breath on exertion   . Skin cancer   . Stroke [facility]) 2002   residual:  "little tingling in palm of left hand"  . UTI (lower urinary tract infection)    "I've had a few"   Past Surgical History:  Procedure Laterality Date  . ABDOMINAL HYSTERECTOMY    . BILE DUCT STENT PLACEMENT  ~ 01/2011  . BREAST LUMPECTOMY  06/29/11   right  . BREAST LUMPECTOMY  06/29/2011   Procedure: BREAST LUMPECTOMY WITH EXCISION OF SENTINEL NODE;  Surgeon: Pedro Earls, MD;  Location: [place];  Service: General;  Laterality: Right;  right sentinel node mapping,right sentinel node biopsy, needle localization right breast lumpectomy  . CATARACT EXTRACTION W/ INTRAOCULAR LENS  IMPLANT, BILATERAL  1990's  . CHOLECYSTECTOMY  03/23/11   lap chole   . COLON SURGERY     hole in intestine ,tumors?  . colonic perforation repair  ~ 2000  . [place]  2012  . INCISION AND DRAINAGE ABSCESS N/A 08/12/2013   Procedure: INCISION AND DRAINAGE ABSCESS;  Surgeon: Earnstine Regal, MD;  Location: [facility];  Service: General;  Laterality: N/A;  .  INCISION AND DRAINAGE ABSCESS N/A 08/20/2013   Procedure: INCISION AND DRAINAGE ABSCESS;  Surgeon: Ralene Ok, MD;  Location: [facility];  Service: General;  Laterality: N/A;  . INCISION AND DRAINAGE BREAST ABSCESS     right breast seroma  . IRRIGATION AND DEBRIDEMENT ABSCESS N/A 08/14/2013   Procedure: Debridment of perineal tissue and debridement of perineal wound;  Surgeon: Earnstine Regal, MD;  Location: [facility];  Service: General;  Laterality: N/A;  . IRRIGATION AND DEBRIDEMENT ABSCESS N/A 08/16/2013   Procedure: DRESSING CHANGE AND DEBRIDEMENT OF PERINEUM WOUND;  Surgeon: Earnstine Regal, MD;  Location: [facility];  Service: General;  Laterality: N/A;  . JOINT REPLACEMENT    . MASS EXCISION Right 12/26/2018     Procedure: EXCISION RIGHT ARM SKIN MASS;  Surgeon: Erroll Luna, MD;  Location: [place];  Service: General;  Laterality: Right;  . MINOR APPLICATION OF WOUND VAC N/A 08/20/2013   Procedure: MINOR APPLICATION OF WOUND VAC;  Surgeon: Ralene Ok, MD;  Location: [facility];  Service: General;  Laterality: N/A;  . pelvic bone tumor removal     twice in 2000's  . PILONIDAL CYST DRAINAGE N/A 08/19/2013   Procedure: Dressing change perineum;  Surgeon: Ralene Ok, MD;  Location: [facility];  Service: General;  Laterality: N/A;  . REPLACEMENT TOTAL KNEE BILATERAL  ~ 2008; ~ 2010   right; left  . TONSILLECTOMY  ~ 1946  . TOTAL HIP ARTHROPLASTY  1980's   right    reports that she has never smoked. She has never used smokeless tobacco. She reports that she does not drink alcohol or use drugs. family history includes Breast cancer in her sister; Diabetes in her sister; Heart disease in her maternal uncle and paternal uncle; Kidney failure in her sister; Prostate cancer in her brother; Stroke in her mother. Allergies  Allergen Reactions  . Ace Inhibitors Other (See Comments)    Tolerates lisinopril at home. Pt does not recall allergy.  . Atorvastatin     REACTION: myalgias  . Oxycodone-Acetaminophen     REACTION: confusion, fatigue  . Rosuvastatin     REACTION: leg weakness  . Simvastatin     REACTION: leg weakness  . Codeine Nausea And Vomiting and Rash  . Sulfonamide Derivatives Hives and Rash   Current Outpatient Medications on File Prior to Visit  Medication Sig Dispense Refill  . acetaminophen (TYLENOL) 325 MG tablet Take 650 mg by mouth every 6 (six) hours as needed (arthritis).     Marland Kitchen allopurinol (ZYLOPRIM) 100 MG tablet TAKE 2 TABLETS BY MOUTH EVERY OTHER DAY AND 1 TABLET EVERY OTHER DAY (Patient taking differently: 100-200 mg See admin instructions. TAKE 2 TABLETS BY MOUTH EVERY OTHER DAY AND 1 TABLET EVERY OTHER DAY) 135 tablet 3  . amLODipine (NORVASC) 5 MG tablet  TAKE 1 TABLET BY MOUTH ONCE A DAY (Patient taking differently: Take 5 mg by mouth daily. ) 90 tablet 2  . aspirin 325 MG EC tablet Take 1 tablet (325 mg total) by mouth every other day.    . cholecalciferol (VITAMIN D) 1000 UNITS tablet Take 1,000 Units by mouth every morning.     . collagenase (SANTYL) ointment Apply 1 application topically daily. 30 g 1  . furosemide (LASIX) 40 MG tablet TAKE 1 TABLET BY MOUTH EVERY MORNING. (Patient taking differently: Take 40 mg by mouth daily. ) 90 tablet 3  . lisinopril (PRINIVIL,ZESTRIL) 20 MG tablet TAKE 2 TABLETS BY MOUTH ONCE DAILY (Patient taking differently:  Take 40 mg by mouth daily. ) 180 tablet 3   No current facility-administered medications on file prior to visit.   Review of Systems  Constitutional: Negative for other unusual diaphoresis or sweats HENT: Negative for ear discharge or swelling Eyes: Negative for other worsening visual disturbances Respiratory: Negative for stridor or other swelling  Gastrointestinal: Negative for worsening distension or other blood Genitourinary: Negative for retention or other urinary change Musculoskeletal: Negative for other MSK pain or swelling Skin: Negative for color change or other new lesions Neurological: Negative for worsening tremors and other numbness  Psychiatric/Behavioral: Negative for worsening agitation or other fatigue All otherwise neg per pt     Objective:   Physical Exam BP 136/72   Pulse 76   Temp 97.7 F (36.5 C)   Resp 12   Ht 5\' 5"  (1.651 m)   Wt 217 lb (98.4 kg)   SpO2 97%   BMI 36.11 kg/m  VS noted,  Constitutional: Pt appears in NAD HENT: Head: NCAT.  Right Ear: External ear normal.  Left Ear: External ear normal.  Eyes: . Pupils are equal, round, and reactive to light. Conjunctivae and EOM are normal Nose: without d/c or deformity Neck: Neck supple. Gross normal ROM Cardiovascular: Normal rate and regular rhythm.   Pulmonary/Chest: Effort normal and breath  sounds without rales or wheezing.  Abd:  Soft, NT, ND, + BS, no organomegaly Neurological: Pt is alert. At baseline orientation, motor grossly intact Skin: Skin is warm. No rashes, other new lesions, no LE edema Psychiatric: Pt behavior is normal without agitation  All otherwise neg per pt  Lab Results  Component Value Date   WBC 9.5 05/30/2019   HGB 11.1 (L) 05/31/2019   HCT 37.5 05/31/2019   PLT 193 05/30/2019   GLUCOSE 138 (H) 05/30/2019   CHOL 191 03/26/2019   TRIG 232.0 (H) 03/26/2019   HDL 36.00 (L) 03/26/2019   LDLDIRECT 129.0 03/26/2019   LDLCALC 124 (H) 06/25/2018   ALT 13 05/30/2019   AST 21 05/30/2019   NA 141 05/30/2019   K 4.5 05/30/2019   CL 100 05/30/2019   CREATININE 1.34 (H) 05/30/2019   BUN 39 (H) 05/30/2019   CO2 29 05/30/2019   TSH 4.98 (H) 03/26/2019   INR 0.9 05/29/2019   HGBA1C 7.1 (H) 03/26/2019   MICROALBUR 3.7 (H) 06/25/2018      Assessment & Plan:

## 2019-06-13 LAB — BASIC METABOLIC PANEL
BUN: 39 mg/dL — ABNORMAL HIGH (ref 6–23)
CO2: 31 mEq/L (ref 19–32)
Calcium: 9.8 mg/dL (ref 8.4–10.5)
Chloride: 102 mEq/L (ref 96–112)
Creatinine, Ser: 1.48 mg/dL — ABNORMAL HIGH (ref 0.40–1.20)
GFR: 33.33 mL/min — ABNORMAL LOW (ref >60.00)
Glucose, Bld: 125 mg/dL — ABNORMAL HIGH (ref 70–99)
Potassium: 4.1 mEq/L (ref 3.5–5.1)
Sodium: 142 mEq/L (ref 135–145)

## 2019-06-16 ENCOUNTER — Encounter: Payer: Self-pay | Admitting: Internal Medicine

## 2019-07-02 ENCOUNTER — Ambulatory Visit: Payer: Medicare Other | Attending: Internal Medicine

## 2019-07-02 ENCOUNTER — Other Ambulatory Visit: Payer: Self-pay

## 2019-07-02 DIAGNOSIS — Z23 Encounter for immunization: Secondary | ICD-10-CM | POA: Insufficient documentation

## 2019-07-02 NOTE — Progress Notes (Signed)
   Covid-19 Vaccination Clinic  Name:  [name]    MRN: [id number] DOB: 03-24-1930  07/02/2019  [name] was observed post Covid-19 immunization for 15 minutes without incidence. She was provided with Vaccine Information Sheet and instruction to access the V-Safe system.   [name] was instructed to call 911 with any severe reactions post vaccine: Marland Kitchen Difficulty breathing  . Swelling of your face and throat  . A fast heartbeat  . A bad rash all over your body  . Dizziness and weakness    Immunizations Administered    Name Date Dose VIS Date Route   Pfizer COVID-19 Vaccine 07/02/2019 10:44 AM 0.3 mL 05/24/2019 Intramuscular   Manufacturer: [place]   Lot: [id number]   [place]: [id number]

## 2019-07-06 ENCOUNTER — Other Ambulatory Visit: Payer: Self-pay | Admitting: Internal Medicine

## 2019-07-06 NOTE — Telephone Encounter (Signed)
Please refill as per office routine med refill policy (all routine meds refilled for 3 mo or monthly per pt preference up to one year from last visit, then month to month grace period for 3 mo, then further med refills will have to be denied)  

## 2019-07-08 MED FILL — FUROSEMIDE 40 MG TAB: 40 | 90 days supply | Qty: 90 | Fill #0

## 2019-07-08 MED FILL — LISINOPRIL 20 MG TABLET: 20 | 90 days supply | Qty: 180 | Fill #0

## 2019-07-09 DIAGNOSIS — M205X2 Other deformities of toe(s) (acquired), left foot: Secondary | ICD-10-CM | POA: Diagnosis not present

## 2019-07-09 DIAGNOSIS — M2041 Other hammer toe(s) (acquired), right foot: Secondary | ICD-10-CM | POA: Diagnosis not present

## 2019-07-09 DIAGNOSIS — M2042 Other hammer toe(s) (acquired), left foot: Secondary | ICD-10-CM | POA: Diagnosis not present

## 2019-07-09 DIAGNOSIS — S91105A Unspecified open wound of left lesser toe(s) without damage to nail, initial encounter: Secondary | ICD-10-CM | POA: Diagnosis not present

## 2019-07-22 ENCOUNTER — Ambulatory Visit: Payer: Medicare Other | Attending: Internal Medicine

## 2019-07-22 DIAGNOSIS — Z23 Encounter for immunization: Secondary | ICD-10-CM | POA: Insufficient documentation

## 2019-07-22 NOTE — Progress Notes (Signed)
   Covid-19 Vaccination Clinic  Name:  [name]    MRN: [id number] DOB: Jun 30, 1929  07/22/2019  [name] was observed post Covid-19 immunization for 15 minutes without incidence. She was provided with Vaccine Information Sheet and instruction to access the V-Safe system.   [name] was instructed to call 911 with any severe reactions post vaccine: Marland Kitchen Difficulty breathing  . Swelling of your face and throat  . A fast heartbeat  . A bad rash all over your body  . Dizziness and weakness    Immunizations Administered    Name Date Dose VIS Date Route   Pfizer COVID-19 Vaccine 07/22/2019  1:03 PM 0.3 mL 05/24/2019 Intramuscular   Manufacturer: [place]   Lot: [id number]   [place]: [id number]

## 2019-07-30 DIAGNOSIS — M2042 Other hammer toe(s) (acquired), left foot: Secondary | ICD-10-CM | POA: Diagnosis not present

## 2019-07-30 DIAGNOSIS — M2041 Other hammer toe(s) (acquired), right foot: Secondary | ICD-10-CM | POA: Diagnosis not present

## 2019-07-30 DIAGNOSIS — M205X2 Other deformities of toe(s) (acquired), left foot: Secondary | ICD-10-CM | POA: Diagnosis not present

## 2019-07-30 DIAGNOSIS — S91105A Unspecified open wound of left lesser toe(s) without damage to nail, initial encounter: Secondary | ICD-10-CM | POA: Diagnosis not present

## 2019-08-05 ENCOUNTER — Other Ambulatory Visit: Payer: Self-pay | Admitting: Internal Medicine

## 2019-08-05 MED FILL — ALLOPURINOL 100 MG TABS: 100 | 85 days supply | Qty: 135 | Fill #0

## 2019-08-05 MED FILL — AMLODIPINE BESYLATE 5 MG TA: 5 | 90 days supply | Qty: 90 | Fill #1

## 2019-08-27 DIAGNOSIS — S91105A Unspecified open wound of left lesser toe(s) without damage to nail, initial encounter: Secondary | ICD-10-CM | POA: Diagnosis not present

## 2019-08-27 DIAGNOSIS — M205X2 Other deformities of toe(s) (acquired), left foot: Secondary | ICD-10-CM | POA: Diagnosis not present

## 2019-08-27 DIAGNOSIS — M2042 Other hammer toe(s) (acquired), left foot: Secondary | ICD-10-CM | POA: Diagnosis not present

## 2019-08-27 DIAGNOSIS — M2041 Other hammer toe(s) (acquired), right foot: Secondary | ICD-10-CM | POA: Diagnosis not present

## 2019-09-09 ENCOUNTER — Ambulatory Visit: Payer: Medicare Other | Admitting: Internal Medicine

## 2019-09-10 DIAGNOSIS — S91105A Unspecified open wound of left lesser toe(s) without damage to nail, initial encounter: Secondary | ICD-10-CM | POA: Diagnosis not present

## 2019-09-10 DIAGNOSIS — M2041 Other hammer toe(s) (acquired), right foot: Secondary | ICD-10-CM | POA: Diagnosis not present

## 2019-09-10 DIAGNOSIS — M205X2 Other deformities of toe(s) (acquired), left foot: Secondary | ICD-10-CM | POA: Diagnosis not present

## 2019-09-10 DIAGNOSIS — M2042 Other hammer toe(s) (acquired), left foot: Secondary | ICD-10-CM | POA: Diagnosis not present

## 2019-09-10 DIAGNOSIS — I739 Peripheral vascular disease, unspecified: Secondary | ICD-10-CM | POA: Diagnosis not present

## 2019-09-25 DIAGNOSIS — S30810A Abrasion of lower back and pelvis, initial encounter: Secondary | ICD-10-CM | POA: Diagnosis not present

## 2019-10-01 DIAGNOSIS — M205X2 Other deformities of toe(s) (acquired), left foot: Secondary | ICD-10-CM | POA: Diagnosis not present

## 2019-10-01 DIAGNOSIS — S91105A Unspecified open wound of left lesser toe(s) without damage to nail, initial encounter: Secondary | ICD-10-CM | POA: Diagnosis not present

## 2019-10-01 DIAGNOSIS — M2041 Other hammer toe(s) (acquired), right foot: Secondary | ICD-10-CM | POA: Diagnosis not present

## 2019-10-01 DIAGNOSIS — M2042 Other hammer toe(s) (acquired), left foot: Secondary | ICD-10-CM | POA: Diagnosis not present

## 2019-10-17 MED FILL — FUROSEMIDE 40 MG TAB: 40 | 90 days supply | Qty: 90 | Fill #1

## 2019-10-17 MED FILL — LISINOPRIL 20 MG TABLET: 20 | 90 days supply | Qty: 180 | Fill #1

## 2019-10-29 DIAGNOSIS — M2041 Other hammer toe(s) (acquired), right foot: Secondary | ICD-10-CM | POA: Diagnosis not present

## 2019-10-29 DIAGNOSIS — M205X2 Other deformities of toe(s) (acquired), left foot: Secondary | ICD-10-CM | POA: Diagnosis not present

## 2019-10-29 DIAGNOSIS — M205X1 Other deformities of toe(s) (acquired), right foot: Secondary | ICD-10-CM | POA: Diagnosis not present

## 2019-10-29 DIAGNOSIS — M2042 Other hammer toe(s) (acquired), left foot: Secondary | ICD-10-CM | POA: Diagnosis not present

## 2019-11-08 MED FILL — ALLOPURINOL 100 MG TABS: 100 | 85 days supply | Qty: 135 | Fill #1

## 2019-11-08 MED FILL — AMLODIPINE BESYLATE 5 MG TA: 5 | 90 days supply | Qty: 90 | Fill #2

## 2019-11-21 DIAGNOSIS — I739 Peripheral vascular disease, unspecified: Secondary | ICD-10-CM | POA: Diagnosis not present

## 2019-12-12 DIAGNOSIS — M205X2 Other deformities of toe(s) (acquired), left foot: Secondary | ICD-10-CM | POA: Diagnosis not present

## 2019-12-12 DIAGNOSIS — M205X1 Other deformities of toe(s) (acquired), right foot: Secondary | ICD-10-CM | POA: Diagnosis not present

## 2019-12-12 DIAGNOSIS — M2042 Other hammer toe(s) (acquired), left foot: Secondary | ICD-10-CM | POA: Diagnosis not present

## 2019-12-12 DIAGNOSIS — M2041 Other hammer toe(s) (acquired), right foot: Secondary | ICD-10-CM | POA: Diagnosis not present

## 2020-01-11 MED FILL — LISINOPRIL 20 MG TABLET: 20 | 90 days supply | Qty: 180 | Fill #2

## 2020-01-11 MED FILL — FUROSEMIDE 40 MG TAB: 40 | 90 days supply | Qty: 90 | Fill #2

## 2020-01-27 ENCOUNTER — Other Ambulatory Visit: Payer: Self-pay | Admitting: Internal Medicine

## 2020-01-27 MED FILL — ALLOPURINOL 100 MG TABS: 100 | 85 days supply | Qty: 135 | Fill #2

## 2020-01-27 MED FILL — AMLODIPINE BESYLATE 5 MG TA: 5 | 90 days supply | Qty: 90 | Fill #0

## 2020-02-21 ENCOUNTER — Other Ambulatory Visit: Payer: Self-pay | Admitting: Surgery

## 2020-02-21 DIAGNOSIS — Z1231 Encounter for screening mammogram for malignant neoplasm of breast: Secondary | ICD-10-CM

## 2020-03-03 DIAGNOSIS — M205X1 Other deformities of toe(s) (acquired), right foot: Secondary | ICD-10-CM | POA: Diagnosis not present

## 2020-03-03 DIAGNOSIS — M2041 Other hammer toe(s) (acquired), right foot: Secondary | ICD-10-CM | POA: Diagnosis not present

## 2020-03-03 DIAGNOSIS — M2042 Other hammer toe(s) (acquired), left foot: Secondary | ICD-10-CM | POA: Diagnosis not present

## 2020-03-03 DIAGNOSIS — M205X2 Other deformities of toe(s) (acquired), left foot: Secondary | ICD-10-CM | POA: Diagnosis not present

## 2020-03-16 ENCOUNTER — Ambulatory Visit: Payer: Medicare Other

## 2020-03-17 DIAGNOSIS — S32031A Stable burst fracture of third lumbar vertebra, initial encounter for closed fracture: Secondary | ICD-10-CM | POA: Diagnosis not present

## 2020-03-17 DIAGNOSIS — M4726 Other spondylosis with radiculopathy, lumbar region: Secondary | ICD-10-CM | POA: Diagnosis not present

## 2020-03-17 DIAGNOSIS — M419 Scoliosis, unspecified: Secondary | ICD-10-CM | POA: Diagnosis not present

## 2020-03-17 DIAGNOSIS — M549 Dorsalgia, unspecified: Secondary | ICD-10-CM | POA: Diagnosis not present

## 2020-03-17 DIAGNOSIS — W19XXXA Unspecified fall, initial encounter: Secondary | ICD-10-CM | POA: Diagnosis not present

## 2020-03-17 DIAGNOSIS — N390 Urinary tract infection, site not specified: Secondary | ICD-10-CM | POA: Diagnosis not present

## 2020-03-17 DIAGNOSIS — M961 Postlaminectomy syndrome, not elsewhere classified: Secondary | ICD-10-CM | POA: Diagnosis not present

## 2020-03-17 DIAGNOSIS — S32012A Unstable burst fracture of first lumbar vertebra, initial encounter for closed fracture: Secondary | ICD-10-CM | POA: Diagnosis not present

## 2020-03-17 DIAGNOSIS — R6889 Other general symptoms and signs: Secondary | ICD-10-CM | POA: Diagnosis not present

## 2020-03-17 DIAGNOSIS — R0902 Hypoxemia: Secondary | ICD-10-CM | POA: Diagnosis not present

## 2020-03-17 DIAGNOSIS — S32030A Wedge compression fracture of third lumbar vertebra, initial encounter for closed fracture: Secondary | ICD-10-CM | POA: Diagnosis not present

## 2020-03-17 DIAGNOSIS — M47814 Spondylosis without myelopathy or radiculopathy, thoracic region: Secondary | ICD-10-CM | POA: Diagnosis not present

## 2020-03-17 DIAGNOSIS — Y998 Other external cause status: Secondary | ICD-10-CM | POA: Diagnosis not present

## 2020-03-17 DIAGNOSIS — S32039A Unspecified fracture of third lumbar vertebra, initial encounter for closed fracture: Secondary | ICD-10-CM | POA: Diagnosis not present

## 2020-03-18 ENCOUNTER — Inpatient Hospital Stay: Admission: EM | Admit: 2020-03-18 | Payer: Medicare Other | Source: Other Acute Inpatient Hospital

## 2020-03-18 DIAGNOSIS — S32029B Unspecified fracture of second lumbar vertebra, initial encounter for open fracture: Secondary | ICD-10-CM | POA: Diagnosis not present

## 2020-03-18 DIAGNOSIS — I10 Essential (primary) hypertension: Secondary | ICD-10-CM | POA: Diagnosis not present

## 2020-03-18 DIAGNOSIS — R6889 Other general symptoms and signs: Secondary | ICD-10-CM | POA: Diagnosis not present

## 2020-03-18 DIAGNOSIS — J90 Pleural effusion, not elsewhere classified: Secondary | ICD-10-CM | POA: Diagnosis not present

## 2020-03-18 DIAGNOSIS — I498 Other specified cardiac arrhythmias: Secondary | ICD-10-CM | POA: Diagnosis not present

## 2020-03-18 DIAGNOSIS — I130 Hypertensive heart and chronic kidney disease with heart failure and stage 1 through stage 4 chronic kidney disease, or unspecified chronic kidney disease: Secondary | ICD-10-CM | POA: Diagnosis not present

## 2020-03-18 DIAGNOSIS — J9811 Atelectasis: Secondary | ICD-10-CM | POA: Diagnosis not present

## 2020-03-18 DIAGNOSIS — S32038D Other fracture of third lumbar vertebra, subsequent encounter for fracture with routine healing: Secondary | ICD-10-CM | POA: Diagnosis not present

## 2020-03-18 DIAGNOSIS — R2681 Unsteadiness on feet: Secondary | ICD-10-CM | POA: Diagnosis not present

## 2020-03-18 DIAGNOSIS — S32019A Unspecified fracture of first lumbar vertebra, initial encounter for closed fracture: Secondary | ICD-10-CM | POA: Diagnosis not present

## 2020-03-18 DIAGNOSIS — Z8673 Personal history of transient ischemic attack (TIA), and cerebral infarction without residual deficits: Secondary | ICD-10-CM | POA: Diagnosis not present

## 2020-03-18 DIAGNOSIS — N179 Acute kidney failure, unspecified: Secondary | ICD-10-CM | POA: Diagnosis not present

## 2020-03-18 DIAGNOSIS — A419 Sepsis, unspecified organism: Secondary | ICD-10-CM | POA: Diagnosis not present

## 2020-03-18 DIAGNOSIS — Z9181 History of falling: Secondary | ICD-10-CM | POA: Diagnosis not present

## 2020-03-18 DIAGNOSIS — I5032 Chronic diastolic (congestive) heart failure: Secondary | ICD-10-CM | POA: Diagnosis not present

## 2020-03-18 DIAGNOSIS — L89152 Pressure ulcer of sacral region, stage 2: Secondary | ICD-10-CM | POA: Diagnosis not present

## 2020-03-18 DIAGNOSIS — J810 Acute pulmonary edema: Secondary | ICD-10-CM | POA: Diagnosis not present

## 2020-03-18 DIAGNOSIS — G9341 Metabolic encephalopathy: Secondary | ICD-10-CM | POA: Diagnosis not present

## 2020-03-18 DIAGNOSIS — S32001D Stable burst fracture of unspecified lumbar vertebra, subsequent encounter for fracture with routine healing: Secondary | ICD-10-CM | POA: Diagnosis not present

## 2020-03-18 DIAGNOSIS — G9600 Cerebrospinal fluid leak, unspecified: Secondary | ICD-10-CM | POA: Diagnosis not present

## 2020-03-18 DIAGNOSIS — J9602 Acute respiratory failure with hypercapnia: Secondary | ICD-10-CM | POA: Diagnosis not present

## 2020-03-18 DIAGNOSIS — S32001A Stable burst fracture of unspecified lumbar vertebra, initial encounter for closed fracture: Secondary | ICD-10-CM | POA: Diagnosis not present

## 2020-03-18 DIAGNOSIS — M2550 Pain in unspecified joint: Secondary | ICD-10-CM | POA: Diagnosis not present

## 2020-03-18 DIAGNOSIS — S32029A Unspecified fracture of second lumbar vertebra, initial encounter for closed fracture: Secondary | ICD-10-CM | POA: Diagnosis not present

## 2020-03-18 DIAGNOSIS — T148XXA Other injury of unspecified body region, initial encounter: Secondary | ICD-10-CM | POA: Diagnosis not present

## 2020-03-18 DIAGNOSIS — G9589 Other specified diseases of spinal cord: Secondary | ICD-10-CM | POA: Diagnosis not present

## 2020-03-18 DIAGNOSIS — N170 Acute kidney failure with tubular necrosis: Secondary | ICD-10-CM | POA: Diagnosis not present

## 2020-03-18 DIAGNOSIS — Z03818 Encounter for observation for suspected exposure to other biological agents ruled out: Secondary | ICD-10-CM | POA: Diagnosis not present

## 2020-03-18 DIAGNOSIS — E785 Hyperlipidemia, unspecified: Secondary | ICD-10-CM | POA: Diagnosis not present

## 2020-03-18 DIAGNOSIS — M8580 Other specified disorders of bone density and structure, unspecified site: Secondary | ICD-10-CM | POA: Diagnosis not present

## 2020-03-18 DIAGNOSIS — Y998 Other external cause status: Secondary | ICD-10-CM | POA: Diagnosis not present

## 2020-03-18 DIAGNOSIS — Z981 Arthrodesis status: Secondary | ICD-10-CM | POA: Diagnosis not present

## 2020-03-18 DIAGNOSIS — N183 Chronic kidney disease, stage 3 unspecified: Secondary | ICD-10-CM | POA: Diagnosis not present

## 2020-03-18 DIAGNOSIS — Z7401 Bed confinement status: Secondary | ICD-10-CM | POA: Diagnosis not present

## 2020-03-18 DIAGNOSIS — W19XXXA Unspecified fall, initial encounter: Secondary | ICD-10-CM | POA: Diagnosis not present

## 2020-03-18 DIAGNOSIS — M8588 Other specified disorders of bone density and structure, other site: Secondary | ICD-10-CM | POA: Diagnosis not present

## 2020-03-18 DIAGNOSIS — W1830XA Fall on same level, unspecified, initial encounter: Secondary | ICD-10-CM | POA: Diagnosis not present

## 2020-03-18 DIAGNOSIS — S32031A Stable burst fracture of third lumbar vertebra, initial encounter for closed fracture: Secondary | ICD-10-CM | POA: Diagnosis not present

## 2020-03-18 DIAGNOSIS — M6281 Muscle weakness (generalized): Secondary | ICD-10-CM | POA: Diagnosis not present

## 2020-03-18 DIAGNOSIS — S36899A Unspecified injury of other intra-abdominal organs, initial encounter: Secondary | ICD-10-CM | POA: Diagnosis not present

## 2020-03-18 DIAGNOSIS — E875 Hyperkalemia: Secondary | ICD-10-CM | POA: Diagnosis not present

## 2020-03-18 DIAGNOSIS — R1310 Dysphagia, unspecified: Secondary | ICD-10-CM | POA: Diagnosis not present

## 2020-03-18 DIAGNOSIS — Z781 Physical restraint status: Secondary | ICD-10-CM | POA: Diagnosis not present

## 2020-03-18 DIAGNOSIS — I959 Hypotension, unspecified: Secondary | ICD-10-CM | POA: Diagnosis not present

## 2020-03-18 DIAGNOSIS — E8770 Fluid overload, unspecified: Secondary | ICD-10-CM | POA: Diagnosis not present

## 2020-03-18 DIAGNOSIS — J9621 Acute and chronic respiratory failure with hypoxia: Secondary | ICD-10-CM | POA: Diagnosis not present

## 2020-03-18 DIAGNOSIS — G9611 Dural tear: Secondary | ICD-10-CM | POA: Diagnosis not present

## 2020-03-18 DIAGNOSIS — R918 Other nonspecific abnormal finding of lung field: Secondary | ICD-10-CM | POA: Diagnosis not present

## 2020-03-18 DIAGNOSIS — S32038A Other fracture of third lumbar vertebra, initial encounter for closed fracture: Secondary | ICD-10-CM | POA: Diagnosis not present

## 2020-03-18 DIAGNOSIS — E119 Type 2 diabetes mellitus without complications: Secondary | ICD-10-CM | POA: Diagnosis not present

## 2020-03-18 DIAGNOSIS — G629 Polyneuropathy, unspecified: Secondary | ICD-10-CM | POA: Diagnosis not present

## 2020-03-18 DIAGNOSIS — Z853 Personal history of malignant neoplasm of breast: Secondary | ICD-10-CM | POA: Diagnosis not present

## 2020-03-18 DIAGNOSIS — M1990 Unspecified osteoarthritis, unspecified site: Secondary | ICD-10-CM | POA: Diagnosis not present

## 2020-03-18 DIAGNOSIS — M2578 Osteophyte, vertebrae: Secondary | ICD-10-CM | POA: Diagnosis not present

## 2020-03-18 DIAGNOSIS — S36892A Contusion of other intra-abdominal organs, initial encounter: Secondary | ICD-10-CM | POA: Diagnosis not present

## 2020-03-18 DIAGNOSIS — R935 Abnormal findings on diagnostic imaging of other abdominal regions, including retroperitoneum: Secondary | ICD-10-CM | POA: Diagnosis not present

## 2020-03-18 DIAGNOSIS — Z743 Need for continuous supervision: Secondary | ICD-10-CM | POA: Diagnosis not present

## 2020-03-18 DIAGNOSIS — R29818 Other symptoms and signs involving the nervous system: Secondary | ICD-10-CM | POA: Diagnosis not present

## 2020-03-18 DIAGNOSIS — S0990XA Unspecified injury of head, initial encounter: Secondary | ICD-10-CM | POA: Diagnosis not present

## 2020-03-18 DIAGNOSIS — R2990 Unspecified symptoms and signs involving the nervous system: Secondary | ICD-10-CM | POA: Diagnosis not present

## 2020-03-18 DIAGNOSIS — I639 Cerebral infarction, unspecified: Secondary | ICD-10-CM | POA: Diagnosis not present

## 2020-03-18 DIAGNOSIS — J9601 Acute respiratory failure with hypoxia: Secondary | ICD-10-CM | POA: Diagnosis not present

## 2020-03-18 DIAGNOSIS — M109 Gout, unspecified: Secondary | ICD-10-CM | POA: Diagnosis not present

## 2020-03-18 DIAGNOSIS — S32030A Wedge compression fracture of third lumbar vertebra, initial encounter for closed fracture: Secondary | ICD-10-CM | POA: Diagnosis not present

## 2020-03-18 DIAGNOSIS — G4700 Insomnia, unspecified: Secondary | ICD-10-CM | POA: Diagnosis not present

## 2020-03-18 DIAGNOSIS — I491 Atrial premature depolarization: Secondary | ICD-10-CM | POA: Diagnosis not present

## 2020-03-18 DIAGNOSIS — R0902 Hypoxemia: Secondary | ICD-10-CM | POA: Diagnosis not present

## 2020-03-18 DIAGNOSIS — R2689 Other abnormalities of gait and mobility: Secondary | ICD-10-CM | POA: Diagnosis not present

## 2020-03-18 DIAGNOSIS — D62 Acute posthemorrhagic anemia: Secondary | ICD-10-CM | POA: Diagnosis not present

## 2020-03-18 DIAGNOSIS — R9431 Abnormal electrocardiogram [ECG] [EKG]: Secondary | ICD-10-CM | POA: Diagnosis not present

## 2020-03-18 DIAGNOSIS — Z043 Encounter for examination and observation following other accident: Secondary | ICD-10-CM | POA: Diagnosis not present

## 2020-03-18 DIAGNOSIS — I517 Cardiomegaly: Secondary | ICD-10-CM | POA: Diagnosis not present

## 2020-03-18 DIAGNOSIS — S32039A Unspecified fracture of third lumbar vertebra, initial encounter for closed fracture: Secondary | ICD-10-CM | POA: Diagnosis not present

## 2020-03-18 DIAGNOSIS — R6521 Severe sepsis with septic shock: Secondary | ICD-10-CM | POA: Diagnosis not present

## 2020-03-18 DIAGNOSIS — J9690 Respiratory failure, unspecified, unspecified whether with hypoxia or hypercapnia: Secondary | ICD-10-CM | POA: Diagnosis not present

## 2020-03-18 DIAGNOSIS — Z96641 Presence of right artificial hip joint: Secondary | ICD-10-CM | POA: Diagnosis not present

## 2020-03-18 DIAGNOSIS — I69828 Other speech and language deficits following other cerebrovascular disease: Secondary | ICD-10-CM | POA: Diagnosis not present

## 2020-03-18 DIAGNOSIS — R0689 Other abnormalities of breathing: Secondary | ICD-10-CM | POA: Diagnosis not present

## 2020-03-19 DIAGNOSIS — M8588 Other specified disorders of bone density and structure, other site: Secondary | ICD-10-CM | POA: Diagnosis not present

## 2020-03-19 DIAGNOSIS — N183 Chronic kidney disease, stage 3 unspecified: Secondary | ICD-10-CM | POA: Diagnosis not present

## 2020-03-19 DIAGNOSIS — J9621 Acute and chronic respiratory failure with hypoxia: Secondary | ICD-10-CM | POA: Diagnosis not present

## 2020-03-19 DIAGNOSIS — I5032 Chronic diastolic (congestive) heart failure: Secondary | ICD-10-CM | POA: Diagnosis not present

## 2020-03-19 DIAGNOSIS — D62 Acute posthemorrhagic anemia: Secondary | ICD-10-CM | POA: Diagnosis not present

## 2020-03-19 DIAGNOSIS — I517 Cardiomegaly: Secondary | ICD-10-CM | POA: Diagnosis not present

## 2020-03-19 DIAGNOSIS — S32038A Other fracture of third lumbar vertebra, initial encounter for closed fracture: Secondary | ICD-10-CM | POA: Diagnosis not present

## 2020-03-19 DIAGNOSIS — R2681 Unsteadiness on feet: Secondary | ICD-10-CM | POA: Diagnosis not present

## 2020-03-19 DIAGNOSIS — W19XXXA Unspecified fall, initial encounter: Secondary | ICD-10-CM | POA: Diagnosis not present

## 2020-03-19 DIAGNOSIS — S36892A Contusion of other intra-abdominal organs, initial encounter: Secondary | ICD-10-CM | POA: Diagnosis not present

## 2020-03-19 DIAGNOSIS — S32039A Unspecified fracture of third lumbar vertebra, initial encounter for closed fracture: Secondary | ICD-10-CM | POA: Diagnosis not present

## 2020-03-19 DIAGNOSIS — R918 Other nonspecific abnormal finding of lung field: Secondary | ICD-10-CM | POA: Diagnosis not present

## 2020-03-19 DIAGNOSIS — I130 Hypertensive heart and chronic kidney disease with heart failure and stage 1 through stage 4 chronic kidney disease, or unspecified chronic kidney disease: Secondary | ICD-10-CM | POA: Diagnosis not present

## 2020-03-20 DIAGNOSIS — S32038D Other fracture of third lumbar vertebra, subsequent encounter for fracture with routine healing: Secondary | ICD-10-CM | POA: Diagnosis not present

## 2020-03-20 DIAGNOSIS — S32038A Other fracture of third lumbar vertebra, initial encounter for closed fracture: Secondary | ICD-10-CM | POA: Diagnosis not present

## 2020-03-20 DIAGNOSIS — M8588 Other specified disorders of bone density and structure, other site: Secondary | ICD-10-CM | POA: Diagnosis not present

## 2020-03-20 DIAGNOSIS — S32001A Stable burst fracture of unspecified lumbar vertebra, initial encounter for closed fracture: Secondary | ICD-10-CM | POA: Diagnosis not present

## 2020-03-20 DIAGNOSIS — G9611 Dural tear: Secondary | ICD-10-CM | POA: Diagnosis not present

## 2020-03-20 DIAGNOSIS — R0902 Hypoxemia: Secondary | ICD-10-CM | POA: Diagnosis not present

## 2020-03-20 DIAGNOSIS — S32039A Unspecified fracture of third lumbar vertebra, initial encounter for closed fracture: Secondary | ICD-10-CM | POA: Diagnosis not present

## 2020-03-20 DIAGNOSIS — S36892A Contusion of other intra-abdominal organs, initial encounter: Secondary | ICD-10-CM | POA: Diagnosis not present

## 2020-03-20 DIAGNOSIS — W19XXXA Unspecified fall, initial encounter: Secondary | ICD-10-CM | POA: Diagnosis not present

## 2020-03-21 DIAGNOSIS — W1830XA Fall on same level, unspecified, initial encounter: Secondary | ICD-10-CM | POA: Diagnosis not present

## 2020-03-21 DIAGNOSIS — S36892A Contusion of other intra-abdominal organs, initial encounter: Secondary | ICD-10-CM | POA: Diagnosis not present

## 2020-03-21 DIAGNOSIS — D62 Acute posthemorrhagic anemia: Secondary | ICD-10-CM | POA: Diagnosis not present

## 2020-03-21 DIAGNOSIS — I959 Hypotension, unspecified: Secondary | ICD-10-CM | POA: Diagnosis not present

## 2020-03-21 DIAGNOSIS — S32039A Unspecified fracture of third lumbar vertebra, initial encounter for closed fracture: Secondary | ICD-10-CM | POA: Diagnosis not present

## 2020-03-21 DIAGNOSIS — S32038A Other fracture of third lumbar vertebra, initial encounter for closed fracture: Secondary | ICD-10-CM | POA: Diagnosis not present

## 2020-03-22 DIAGNOSIS — S32038A Other fracture of third lumbar vertebra, initial encounter for closed fracture: Secondary | ICD-10-CM | POA: Diagnosis not present

## 2020-03-22 DIAGNOSIS — W1830XA Fall on same level, unspecified, initial encounter: Secondary | ICD-10-CM | POA: Diagnosis not present

## 2020-03-22 DIAGNOSIS — S36892A Contusion of other intra-abdominal organs, initial encounter: Secondary | ICD-10-CM | POA: Diagnosis not present

## 2020-03-23 DIAGNOSIS — E8770 Fluid overload, unspecified: Secondary | ICD-10-CM | POA: Diagnosis not present

## 2020-03-23 DIAGNOSIS — W1830XA Fall on same level, unspecified, initial encounter: Secondary | ICD-10-CM | POA: Diagnosis not present

## 2020-03-23 DIAGNOSIS — J9690 Respiratory failure, unspecified, unspecified whether with hypoxia or hypercapnia: Secondary | ICD-10-CM | POA: Diagnosis not present

## 2020-03-23 DIAGNOSIS — I959 Hypotension, unspecified: Secondary | ICD-10-CM | POA: Diagnosis not present

## 2020-03-23 DIAGNOSIS — S36892A Contusion of other intra-abdominal organs, initial encounter: Secondary | ICD-10-CM | POA: Diagnosis not present

## 2020-03-23 DIAGNOSIS — N170 Acute kidney failure with tubular necrosis: Secondary | ICD-10-CM | POA: Diagnosis not present

## 2020-03-23 DIAGNOSIS — S32039A Unspecified fracture of third lumbar vertebra, initial encounter for closed fracture: Secondary | ICD-10-CM | POA: Diagnosis not present

## 2020-03-24 DIAGNOSIS — J9690 Respiratory failure, unspecified, unspecified whether with hypoxia or hypercapnia: Secondary | ICD-10-CM | POA: Diagnosis not present

## 2020-03-24 DIAGNOSIS — W1830XA Fall on same level, unspecified, initial encounter: Secondary | ICD-10-CM | POA: Diagnosis not present

## 2020-03-24 DIAGNOSIS — E8770 Fluid overload, unspecified: Secondary | ICD-10-CM | POA: Diagnosis not present

## 2020-03-24 DIAGNOSIS — I959 Hypotension, unspecified: Secondary | ICD-10-CM | POA: Diagnosis not present

## 2020-03-24 DIAGNOSIS — N170 Acute kidney failure with tubular necrosis: Secondary | ICD-10-CM | POA: Diagnosis not present

## 2020-03-24 DIAGNOSIS — S32039A Unspecified fracture of third lumbar vertebra, initial encounter for closed fracture: Secondary | ICD-10-CM | POA: Diagnosis not present

## 2020-03-24 DIAGNOSIS — S36892A Contusion of other intra-abdominal organs, initial encounter: Secondary | ICD-10-CM | POA: Diagnosis not present

## 2020-03-25 DIAGNOSIS — I959 Hypotension, unspecified: Secondary | ICD-10-CM | POA: Diagnosis not present

## 2020-03-25 DIAGNOSIS — W19XXXA Unspecified fall, initial encounter: Secondary | ICD-10-CM | POA: Diagnosis not present

## 2020-03-25 DIAGNOSIS — R2990 Unspecified symptoms and signs involving the nervous system: Secondary | ICD-10-CM | POA: Diagnosis not present

## 2020-03-25 DIAGNOSIS — Z96641 Presence of right artificial hip joint: Secondary | ICD-10-CM | POA: Diagnosis not present

## 2020-03-25 DIAGNOSIS — E785 Hyperlipidemia, unspecified: Secondary | ICD-10-CM | POA: Diagnosis not present

## 2020-03-25 DIAGNOSIS — R935 Abnormal findings on diagnostic imaging of other abdominal regions, including retroperitoneum: Secondary | ICD-10-CM | POA: Diagnosis not present

## 2020-03-25 DIAGNOSIS — I10 Essential (primary) hypertension: Secondary | ICD-10-CM | POA: Diagnosis not present

## 2020-03-25 DIAGNOSIS — S32039A Unspecified fracture of third lumbar vertebra, initial encounter for closed fracture: Secondary | ICD-10-CM | POA: Diagnosis not present

## 2020-03-25 DIAGNOSIS — W1830XA Fall on same level, unspecified, initial encounter: Secondary | ICD-10-CM | POA: Diagnosis not present

## 2020-03-25 DIAGNOSIS — R0689 Other abnormalities of breathing: Secondary | ICD-10-CM | POA: Diagnosis not present

## 2020-03-25 DIAGNOSIS — S0990XA Unspecified injury of head, initial encounter: Secondary | ICD-10-CM | POA: Diagnosis not present

## 2020-03-25 DIAGNOSIS — N170 Acute kidney failure with tubular necrosis: Secondary | ICD-10-CM | POA: Diagnosis not present

## 2020-03-25 DIAGNOSIS — I639 Cerebral infarction, unspecified: Secondary | ICD-10-CM | POA: Diagnosis not present

## 2020-03-25 DIAGNOSIS — S36892A Contusion of other intra-abdominal organs, initial encounter: Secondary | ICD-10-CM | POA: Diagnosis not present

## 2020-03-25 DIAGNOSIS — E8770 Fluid overload, unspecified: Secondary | ICD-10-CM | POA: Diagnosis not present

## 2020-03-25 DIAGNOSIS — R29818 Other symptoms and signs involving the nervous system: Secondary | ICD-10-CM | POA: Diagnosis not present

## 2020-03-25 DIAGNOSIS — E119 Type 2 diabetes mellitus without complications: Secondary | ICD-10-CM | POA: Diagnosis not present

## 2020-03-25 DIAGNOSIS — J9690 Respiratory failure, unspecified, unspecified whether with hypoxia or hypercapnia: Secondary | ICD-10-CM | POA: Diagnosis not present

## 2020-03-26 DIAGNOSIS — J9690 Respiratory failure, unspecified, unspecified whether with hypoxia or hypercapnia: Secondary | ICD-10-CM | POA: Diagnosis not present

## 2020-03-26 DIAGNOSIS — N170 Acute kidney failure with tubular necrosis: Secondary | ICD-10-CM | POA: Diagnosis not present

## 2020-03-26 DIAGNOSIS — E8770 Fluid overload, unspecified: Secondary | ICD-10-CM | POA: Diagnosis not present

## 2020-03-26 DIAGNOSIS — I959 Hypotension, unspecified: Secondary | ICD-10-CM | POA: Diagnosis not present

## 2020-03-26 DIAGNOSIS — S36892A Contusion of other intra-abdominal organs, initial encounter: Secondary | ICD-10-CM | POA: Diagnosis not present

## 2020-03-26 DIAGNOSIS — S32039A Unspecified fracture of third lumbar vertebra, initial encounter for closed fracture: Secondary | ICD-10-CM | POA: Diagnosis not present

## 2020-03-26 DIAGNOSIS — W1830XA Fall on same level, unspecified, initial encounter: Secondary | ICD-10-CM | POA: Diagnosis not present

## 2020-03-27 DIAGNOSIS — S32029B Unspecified fracture of second lumbar vertebra, initial encounter for open fracture: Secondary | ICD-10-CM | POA: Diagnosis not present

## 2020-03-27 DIAGNOSIS — W1830XA Fall on same level, unspecified, initial encounter: Secondary | ICD-10-CM | POA: Diagnosis not present

## 2020-03-27 DIAGNOSIS — W19XXXA Unspecified fall, initial encounter: Secondary | ICD-10-CM | POA: Diagnosis not present

## 2020-03-27 DIAGNOSIS — J9690 Respiratory failure, unspecified, unspecified whether with hypoxia or hypercapnia: Secondary | ICD-10-CM | POA: Diagnosis not present

## 2020-03-27 DIAGNOSIS — S36892A Contusion of other intra-abdominal organs, initial encounter: Secondary | ICD-10-CM | POA: Diagnosis not present

## 2020-03-27 DIAGNOSIS — S32039A Unspecified fracture of third lumbar vertebra, initial encounter for closed fracture: Secondary | ICD-10-CM | POA: Diagnosis not present

## 2020-03-27 DIAGNOSIS — N170 Acute kidney failure with tubular necrosis: Secondary | ICD-10-CM | POA: Diagnosis not present

## 2020-03-27 LAB — CBC: RBC: 3.05 — AB (ref 3.87–5.11)

## 2020-03-27 LAB — CBC AND DIFFERENTIAL
HCT: 27 — AB (ref 36–46)
Hemoglobin: 8.5 — AB (ref 12.0–16.0)
Platelets: 349 (ref 150–399)
WBC: 8.7

## 2020-03-28 DIAGNOSIS — S32039A Unspecified fracture of third lumbar vertebra, initial encounter for closed fracture: Secondary | ICD-10-CM | POA: Diagnosis not present

## 2020-03-28 DIAGNOSIS — S36892A Contusion of other intra-abdominal organs, initial encounter: Secondary | ICD-10-CM | POA: Diagnosis not present

## 2020-03-29 DIAGNOSIS — S32039A Unspecified fracture of third lumbar vertebra, initial encounter for closed fracture: Secondary | ICD-10-CM | POA: Diagnosis not present

## 2020-03-29 DIAGNOSIS — S36892A Contusion of other intra-abdominal organs, initial encounter: Secondary | ICD-10-CM | POA: Diagnosis not present

## 2020-03-29 LAB — BASIC METABOLIC PANEL
BUN: 53 — AB (ref 4–21)
CO2: 32 — AB (ref 13–22)
Chloride: 106 (ref 99–108)
Creatinine: 1.4 — AB (ref 0.5–1.1)
Glucose: 109
Potassium: 4.9 (ref 3.4–5.3)
Sodium: 145 (ref 137–147)

## 2020-03-29 LAB — COMPREHENSIVE METABOLIC PANEL
Calcium: 9.3 (ref 8.7–10.7)
GFR calc non Af Amer: 34

## 2020-03-30 DIAGNOSIS — S32039A Unspecified fracture of third lumbar vertebra, initial encounter for closed fracture: Secondary | ICD-10-CM | POA: Diagnosis not present

## 2020-03-30 DIAGNOSIS — S36892A Contusion of other intra-abdominal organs, initial encounter: Secondary | ICD-10-CM | POA: Diagnosis not present

## 2020-03-30 LAB — BASIC METABOLIC PANEL
BUN: 49 — AB (ref 4–21)
CO2: 30 — AB (ref 13–22)
Chloride: 108 (ref 99–108)
Creatinine: 1.3 — AB (ref 0.5–1.1)
Glucose: 113
Potassium: 4.6 (ref 3.4–5.3)
Sodium: 145 (ref 137–147)

## 2020-03-30 LAB — COMPREHENSIVE METABOLIC PANEL
Calcium: 9.2 (ref 8.7–10.7)
GFR calc non Af Amer: 38

## 2020-03-31 DIAGNOSIS — N170 Acute kidney failure with tubular necrosis: Secondary | ICD-10-CM | POA: Diagnosis not present

## 2020-03-31 DIAGNOSIS — J810 Acute pulmonary edema: Secondary | ICD-10-CM | POA: Diagnosis not present

## 2020-03-31 DIAGNOSIS — S32001S Stable burst fracture of unspecified lumbar vertebra, sequela: Secondary | ICD-10-CM | POA: Diagnosis not present

## 2020-03-31 DIAGNOSIS — R6889 Other general symptoms and signs: Secondary | ICD-10-CM | POA: Diagnosis not present

## 2020-03-31 DIAGNOSIS — Z4789 Encounter for other orthopedic aftercare: Secondary | ICD-10-CM | POA: Diagnosis not present

## 2020-03-31 DIAGNOSIS — M2550 Pain in unspecified joint: Secondary | ICD-10-CM | POA: Diagnosis not present

## 2020-03-31 DIAGNOSIS — M8580 Other specified disorders of bone density and structure, unspecified site: Secondary | ICD-10-CM | POA: Diagnosis not present

## 2020-03-31 DIAGNOSIS — J9601 Acute respiratory failure with hypoxia: Secondary | ICD-10-CM | POA: Diagnosis not present

## 2020-03-31 DIAGNOSIS — D649 Anemia, unspecified: Secondary | ICD-10-CM | POA: Diagnosis not present

## 2020-03-31 DIAGNOSIS — Z981 Arthrodesis status: Secondary | ICD-10-CM | POA: Diagnosis not present

## 2020-03-31 DIAGNOSIS — B370 Candidal stomatitis: Secondary | ICD-10-CM | POA: Diagnosis not present

## 2020-03-31 DIAGNOSIS — M6281 Muscle weakness (generalized): Secondary | ICD-10-CM | POA: Diagnosis not present

## 2020-03-31 DIAGNOSIS — I69828 Other speech and language deficits following other cerebrovascular disease: Secondary | ICD-10-CM | POA: Diagnosis not present

## 2020-03-31 DIAGNOSIS — L89152 Pressure ulcer of sacral region, stage 2: Secondary | ICD-10-CM | POA: Diagnosis not present

## 2020-03-31 DIAGNOSIS — J069 Acute upper respiratory infection, unspecified: Secondary | ICD-10-CM | POA: Diagnosis not present

## 2020-03-31 DIAGNOSIS — Z9181 History of falling: Secondary | ICD-10-CM | POA: Diagnosis not present

## 2020-03-31 DIAGNOSIS — S32001D Stable burst fracture of unspecified lumbar vertebra, subsequent encounter for fracture with routine healing: Secondary | ICD-10-CM | POA: Diagnosis not present

## 2020-03-31 DIAGNOSIS — I5033 Acute on chronic diastolic (congestive) heart failure: Secondary | ICD-10-CM | POA: Diagnosis not present

## 2020-03-31 DIAGNOSIS — Z03818 Encounter for observation for suspected exposure to other biological agents ruled out: Secondary | ICD-10-CM | POA: Diagnosis not present

## 2020-03-31 DIAGNOSIS — S32039A Unspecified fracture of third lumbar vertebra, initial encounter for closed fracture: Secondary | ICD-10-CM | POA: Diagnosis not present

## 2020-03-31 DIAGNOSIS — W19XXXA Unspecified fall, initial encounter: Secondary | ICD-10-CM | POA: Diagnosis not present

## 2020-03-31 DIAGNOSIS — I10 Essential (primary) hypertension: Secondary | ICD-10-CM | POA: Diagnosis not present

## 2020-03-31 DIAGNOSIS — Z743 Need for continuous supervision: Secondary | ICD-10-CM | POA: Diagnosis not present

## 2020-03-31 DIAGNOSIS — N179 Acute kidney failure, unspecified: Secondary | ICD-10-CM | POA: Diagnosis not present

## 2020-03-31 DIAGNOSIS — R2681 Unsteadiness on feet: Secondary | ICD-10-CM | POA: Diagnosis not present

## 2020-03-31 DIAGNOSIS — S32001A Stable burst fracture of unspecified lumbar vertebra, initial encounter for closed fracture: Secondary | ICD-10-CM | POA: Diagnosis not present

## 2020-03-31 DIAGNOSIS — Z7401 Bed confinement status: Secondary | ICD-10-CM | POA: Diagnosis not present

## 2020-03-31 DIAGNOSIS — M109 Gout, unspecified: Secondary | ICD-10-CM | POA: Diagnosis not present

## 2020-03-31 DIAGNOSIS — J90 Pleural effusion, not elsewhere classified: Secondary | ICD-10-CM | POA: Diagnosis not present

## 2020-03-31 DIAGNOSIS — M1990 Unspecified osteoarthritis, unspecified site: Secondary | ICD-10-CM | POA: Diagnosis not present

## 2020-04-02 ENCOUNTER — Ambulatory Visit: Payer: Medicare Other

## 2020-04-03 ENCOUNTER — Encounter: Payer: Self-pay | Admitting: Internal Medicine

## 2020-04-03 ENCOUNTER — Non-Acute Institutional Stay (SKILLED_NURSING_FACILITY): Payer: Medicare Other | Admitting: Internal Medicine

## 2020-04-03 DIAGNOSIS — N170 Acute kidney failure with tubular necrosis: Secondary | ICD-10-CM | POA: Insufficient documentation

## 2020-04-03 DIAGNOSIS — I5033 Acute on chronic diastolic (congestive) heart failure: Secondary | ICD-10-CM | POA: Diagnosis not present

## 2020-04-03 DIAGNOSIS — J189 Pneumonia, unspecified organism: Secondary | ICD-10-CM | POA: Insufficient documentation

## 2020-04-03 DIAGNOSIS — J9601 Acute respiratory failure with hypoxia: Secondary | ICD-10-CM | POA: Diagnosis not present

## 2020-04-03 DIAGNOSIS — D62 Acute posthemorrhagic anemia: Secondary | ICD-10-CM

## 2020-04-03 DIAGNOSIS — S32001A Stable burst fracture of unspecified lumbar vertebra, initial encounter for closed fracture: Secondary | ICD-10-CM

## 2020-04-03 DIAGNOSIS — J9602 Acute respiratory failure with hypercapnia: Secondary | ICD-10-CM

## 2020-04-03 DIAGNOSIS — R1310 Dysphagia, unspecified: Secondary | ICD-10-CM

## 2020-04-03 DIAGNOSIS — Z9181 History of falling: Secondary | ICD-10-CM

## 2020-04-03 NOTE — Progress Notes (Signed)
Provider:  Rexene Edison. Mariea Clonts, D.O., C.M.D. Location:  [facility] Room Number: 101-P Place of Service:  SNF (31)  PCP: Biagio Borg, MD Patient Care Team: Biagio Borg, MD as PCP - General Magrinat, Virgie Dad, MD as Consulting Physician (Oncology)  Extended Emergency Contact Information Primary Emergency Contact: Ehler,Arnold Address: [address]          [place], [place] [address] Johnnette Litter of [place] Phone: [phone] Mobile Phone: [phone] Relation: Spouse Secondary Emergency Contact: [place] of [place] Phone: [phone] Mobile Phone: [phone] Relation: Son  Code Status: FULL CODE Goals of Care: Advanced Directive information Advanced Directives 04/03/2020  Does Patient Have a Medical Advance Directive? No  Type of Advance Directive -  Does patient want to make changes to medical advance directive? -  Would patient like information on creating a medical advance directive? No - Patient declined  Pre-existing out of facility DNR order (yellow form or pink MOST form) -   Chief Complaint  Patient presents with  . New Admit To SNF    New Admission to SNF.    HPI: Patient is a 86 y.o. female seen today for admission to [facility] s/p hospitalization 10/6-19 at [facility] for ground level fall with L3 burst fracture, retroperitoneal hematoma.  She underwent spinal fusion of lumbar 2-4 03/20/20.  She had a complicated stay.    Per records reviewed from [facility], she was walking with her walker on the sidewalk from her yard when she fell backwards on her lower back, did not lose consciousness or hit her head.  She had a scan at an outside hospital with the L3 burst fx.  She then was transported to [facility] where she c/o LBP.  Unfortunately, she developed acute hypoxic/hypercarbic respiratory failure requiring bipap and aggressive diuresis, acute blood loss anemia requiring transfusions, hypotension  requiring pressors, acute kidney injury, code stroke negative for stroke but with declined GCS (hypercarbia) then requiring intubation and code sepsis requiring abx for 7 days with vanc/cefepime for pneumonia.  She was diuresed on diamox there and kept off her home lasix until the process is complete and she is weaned form oxygen.    After her surgery, she's been permitted to ambulate with a TLSO brace when OOB.  She was placed on lovenox DVT prophylaxis.  Her AKI was felt to be acute ischemia ATN from hypotension/hypovolemia, anemia and sepsis.  D/c cr was 1.23 and requires continued monitoring.    She was begun on PT, OT and discharged here to continue her recovery.  No outpatient follow-ups were listed.    When seen, she remains on O2.  She still feels sob.  She is sitting up in bed.  She's still on diamox.  She is to be on the facility twice a week weights protocol for her CHF to help [place] determine how she is doing with her volume status.   She is not reporting pain and has the TLSO brace which hers is the two white plastic half shells with straps not the black more supportive variety.  She says it's the most ridiculous thing ever.  She was sitting up in her be though when seen so not wearing it.  She admits to being weak and cannot believe she's had all of this happen to her.  Trying to take it in stride.  She told me all about their home and her husband was visiting who has dementia.  She also talks about a bar and something about his drinking at some point (?) in the past.  She reports he is still functional and he says only 1-2 words and then went into her restroom and disappeared for the rest of our visit.    Past Medical History:  Diagnosis Date  . Anemia   . Anxiety   . Atrial flutter ([place])    "sometimes"  . Blood transfusion   . Breast cancer ([place])    right breast  . Bronchitis   . Chronic kidney disease    occ uti's  . CKD (chronic kidney disease) stage 3, GFR 30-59 ml/min ([name])    . Colon polyps    hyperplastic  . CVA (cerebral infarction)   . Diverticulosis   . DVT of leg (deep venous thrombosis) ([name])    left; S/P OR  . Dysrhythmia    dr bensimhon   . Esophageal stricture   . GERD (gastroesophageal reflux disease)   . Gout   . Hiatal hernia    4 cm  . History of radiation therapy 02/15/12-04/02/12   right breast 4680 cGy/26 sessions, right boost=1400cGy /7 sessions  . Hyperlipidemia   . Hypertension   . IBS (irritable bowel syndrome)   . Kidney stones 1990's  . Morbid obesity ([place])   . Osteoarthritis   . Personal history of radiation therapy   . Pneumonia    "walking"  . PONV (postoperative nausea and vomiting)   . Shingles 02/21/2011  . Shortness of breath on exertion   . Skin cancer   . Stroke [facility]) 2002   residual:  "little tingling in palm of left hand"  . UTI (lower urinary tract infection)    "I've had a few"   Past Surgical History:  Procedure Laterality Date  . ABDOMINAL HYSTERECTOMY    . BILE DUCT STENT PLACEMENT  ~ 01/2011  . BREAST LUMPECTOMY  06/29/11   right  . BREAST LUMPECTOMY  06/29/2011   Procedure: BREAST LUMPECTOMY WITH EXCISION OF SENTINEL NODE;  Surgeon: Pedro Earls, MD;  Location: [facility];  Service: General;  Laterality: Right;  right sentinel node mapping,right sentinel node biopsy, needle localization right breast lumpectomy  . CATARACT EXTRACTION W/ INTRAOCULAR LENS  IMPLANT, BILATERAL  1990's  . CHOLECYSTECTOMY  03/23/11   lap chole   . COLON SURGERY     hole in intestine ,tumors?  . colonic perforation repair  ~ 2000  . [place]  2012  . INCISION AND DRAINAGE ABSCESS N/A 08/12/2013   Procedure: INCISION AND DRAINAGE ABSCESS;  Surgeon: Earnstine Regal, MD;  Location: [facility];  Service: General;  Laterality: N/A;  . INCISION AND DRAINAGE ABSCESS N/A 08/20/2013   Procedure: INCISION AND DRAINAGE ABSCESS;  Surgeon: Ralene Ok, MD;  Location: [facility];  Service: General;  Laterality: N/A;  . INCISION AND  DRAINAGE BREAST ABSCESS     right breast seroma  . IRRIGATION AND DEBRIDEMENT ABSCESS N/A 08/14/2013   Procedure: Debridment of perineal tissue and debridement of perineal wound;  Surgeon: Earnstine Regal, MD;  [facility]: [facility];  Service: General;  Laterality: N/A;  . IRRIGATION AND DEBRIDEMENT ABSCESS N/A 08/16/2013   Procedure: DRESSING CHANGE AND DEBRIDEMENT OF PERINEUM WOUND;  Surgeon: Earnstine Regal, MD;  Location: [facility];  Service: General;  Laterality: N/A;  . JOINT REPLACEMENT    . MASS EXCISION Right 12/26/2018   Procedure: EXCISION RIGHT ARM SKIN MASS;  Surgeon: Erroll Luna, MD;  Location: [facility];  Service: General;  Laterality: Right;  . MINOR APPLICATION OF WOUND VAC N/A 08/20/2013   Procedure: MINOR APPLICATION OF WOUND VAC;  Surgeon: Ralene Ok, MD;  Location: [facility];  Service: General;  Laterality: N/A;  . pelvic bone tumor removal     twice in 2000's  . PILONIDAL CYST DRAINAGE N/A 08/19/2013   Procedure: Dressing change perineum;  Surgeon: Ralene Ok, MD;  [facility];  Service: General;  Laterality: N/A;  . REPLACEMENT TOTAL KNEE BILATERAL  ~ 2008; ~ 2010   right; left  . TONSILLECTOMY  ~ 1946  . TOTAL HIP ARTHROPLASTY  1980's   right    Social History   Socioeconomic History  . Marital status: Married    Spouse name: Not on file  . Number of children: Not on file  . Years of education: Not on file  . Highest education level: Not on file  Occupational History  . Occupation: hairdresser  Tobacco Use  . Smoking status: Never Smoker  . Smokeless tobacco: Never Used  Substance and Sexual Activity  . Alcohol use: No  . Drug use: No  . Sexual activity: Never  Other Topics Concern  . Not on file  Social History Narrative   Lives at home with her husband.  Uses a walker for ambulation   Social Determinants of Health   Financial Resource Strain:   . Difficulty of Paying Living Expenses: Not on file  Food Insecurity:   . Worried About  Charity fundraiser in the Last Year: Not on file  . Ran Out of Food in the Last Year: Not on file  Transportation Needs:   . Lack of Transportation (Medical): Not on file  . Lack of Transportation (Non-Medical): Not on file  Physical Activity:   . Days of Exercise per Week: Not on file  . Minutes of Exercise per Session: Not on file  Stress:   . Feeling of Stress : Not on file  Social Connections:   . Frequency of Communication with Friends and Family: Not on file  . Frequency of Social Gatherings with Friends and Family: Not on file  . Attends Religious Services: Not on file  . Active Member of Clubs or Organizations: Not on file  . Attends Archivist Meetings: Not on file  . Marital Status: Not on file    reports that she has never smoked. She has never used smokeless tobacco. She reports that she does not drink alcohol and does not use drugs.  Functional Status Survey:    Family History  Problem Relation Age of Onset  . Breast cancer Sister        x 2  . Prostate cancer Brother   . Stroke Mother   . Kidney failure Sister   . Diabetes Sister   . Heart disease Paternal Uncle        multiple  . Heart disease Maternal Uncle        multiple  . Colon cancer Neg Hx     Health Maintenance  Topic Date Due  . PNA vac Low Risk Adult (2 of 2 - PCV13) 06/13/2013  . OPHTHALMOLOGY EXAM  05/13/2017  . TETANUS/TDAP  06/18/2019  . FOOT EXAM  06/26/2019  . INFLUENZA VACCINE  01/12/2020  . DEXA SCAN  Completed  . COVID-19 Vaccine  Completed    Allergies  Allergen Reactions  . Ace Inhibitors Other (See Comments)    Tolerates lisinopril at home. Pt does not  recall allergy.  . Atorvastatin     REACTION: myalgias  . Oxycodone-Acetaminophen     REACTION: confusion, fatigue  . Rosuvastatin     REACTION: leg weakness  . Simvastatin     REACTION: leg weakness  . Codeine Nausea And Vomiting and Rash  . Sulfonamide Derivatives Hives and Rash    Outpatient Encounter  Medications as of 04/03/2020  Medication Sig  . acetaminophen (TYLENOL) 325 MG tablet Take 650 mg by mouth every 6 (six) hours as needed (arthritis).   Marland Kitchen acetaZOLAMIDE (DIAMOX) 250 MG tablet Take 250 mg by mouth 3 (three) times daily.  Marland Kitchen allopurinol (ZYLOPRIM) 100 MG tablet TAKE 2 TABLETS BY MOUTH ALTERNATING WITH 1 TABLET EVERY OTHER DAY  . allopurinol (ZYLOPRIM) 100 MG tablet Take 100 mg by mouth every other day.  Marland Kitchen amLODipine (NORVASC) 5 MG tablet TAKE 1 TABLET BY MOUTH ONCE A DAY  . aspirin 325 MG EC tablet Take 1 tablet (325 mg total) by mouth every other day.  . ipratropium-albuterol (DUONEB) 0.5-2.5 (3) MG/3ML SOLN Take 3 mLs by nebulization in the morning and at bedtime. At Morning and Bedtime.  Marland Kitchen lisinopril (ZESTRIL) 40 MG tablet Take 40 mg by mouth daily.  . Magnesium Hydroxide (MILK OF MAGNESIA PO) Take 30 mLs by mouth daily.  . Multiple Vitamin (MULTIVITAMIN ADULT PO) Take 1 tablet by mouth daily.  . [DISCONTINUED] cholecalciferol (VITAMIN D) 1000 UNITS tablet Take 1,000 Units by mouth every morning.   . [DISCONTINUED] collagenase (SANTYL) ointment Apply 1 application topically daily.  . [DISCONTINUED] furosemide (LASIX) 40 MG tablet TAKE 1 TABLET BY MOUTH EVERY MORNING  . [DISCONTINUED] lisinopril (ZESTRIL) 20 MG tablet TAKE 2 TABLETS BY MOUTH ONCE A DAY   No facility-administered encounter medications on file as of 04/03/2020.    Review of Systems  Constitutional: Positive for malaise/fatigue. Negative for chills and fever.  HENT: Negative for congestion, hearing loss and sore throat.   Eyes: Negative for blurred vision.  Respiratory: Positive for shortness of breath. Negative for cough and sputum production.   Cardiovascular: Positive for orthopnea, leg swelling and PND. Negative for chest pain and palpitations.       Was having  Gastrointestinal: Negative for abdominal pain, blood in stool, constipation and melena.  Genitourinary: Positive for frequency. Negative for  dysuria and urgency.  Musculoskeletal: Positive for back pain and falls. Negative for joint pain and myalgias.  Skin: Negative for itching and rash.  Neurological: Positive for weakness. Negative for dizziness and loss of consciousness.  Endo/Heme/Allergies: Bruises/bleeds easily.  Psychiatric/Behavioral: Negative for depression and memory loss. The patient is not nervous/anxious and does not have insomnia.     Vitals:   04/03/20 1117  BP: 130/72  Pulse: 83  Resp: 18  Temp: 97.6 F (36.4 C)  SpO2: 96%  Weight: 217 lb (98.4 kg)  Height: 5\' 5"  (1.651 m)   Body mass index is 36.11 kg/m. Physical Exam Vitals reviewed.  Constitutional:      General: She is not in acute distress.    Appearance: Normal appearance. She is obese. She is not toxic-appearing.  HENT:     Head: Normocephalic and atraumatic.     Right Ear: External ear normal.     Left Ear: External ear normal.     Nose: No congestion.     Mouth/Throat:     Pharynx: Oropharynx is clear.  Eyes:     Extraocular Movements: Extraocular movements intact.     Conjunctiva/sclera: Conjunctivae normal.  Pupils: Pupils are equal, round, and reactive to light.  Cardiovascular:     Rate and Rhythm: Normal rate and regular rhythm.     Heart sounds: No murmur heard.   Pulmonary:     Effort: Pulmonary effort is normal. No respiratory distress.     Breath sounds: Rhonchi and rales present.  Abdominal:     General: Bowel sounds are normal. There is no distension.     Palpations: Abdomen is soft.     Tenderness: There is no abdominal tenderness. There is no guarding or rebound.  Musculoskeletal:        General: Normal range of motion.     Cervical back: Neck supple.     Right lower leg: No edema.     Left lower leg: No edema.  Lymphadenopathy:     Cervical: No cervical adenopathy.  Skin:    General: Skin is warm and dry.  Neurological:     Mental Status: She is alert and oriented to person, place, and time.     Motor:  Weakness present.  Psychiatric:        Mood and Affect: Mood normal.     Labs reviewed: Basic Metabolic Panel: Recent Labs    05/29/19 1413 05/29/19 1413 05/30/19 0755 05/30/19 0755 06/12/19 1539 03/29/20 0000 03/30/20 0000  NA 143   < > 141   < > 142 145 145  K 4.5   < > 4.5   < > 4.1 4.9 4.6  CL 103   < > 100   < > 102 106 108  CO2 29   < > 29   < > 31 32* 30*  GLUCOSE 118*  --  138*  --  125*  --   --   BUN 43*   < > 39*   < > 39* 53* 49*  CREATININE 1.42*   < > 1.34*   < > 1.48* 1.4* 1.3*  CALCIUM 9.7   < > 9.1   < > 9.8 9.3 9.2   < > = values in this interval not displayed.   Liver Function Tests: Recent Labs    05/29/19 1413 05/30/19 0755  AST 20 21  ALT 16 13  ALKPHOS 94 70  BILITOT 0.7 0.9  PROT 7.8 6.3*  ALBUMIN 3.9 3.0*   No results for input(s): LIPASE, AMYLASE in the last 8760 hours. No results for input(s): AMMONIA in the last 8760 hours. CBC: Recent Labs    05/29/19 1413 05/29/19 2312 05/30/19 0228 05/30/19 0755 05/31/19 0812 06/12/19 1539 03/27/20 0000  WBC 10.2  --  9.5  --   --  9.7 8.7  NEUTROABS  --   --   --   --   --  6.5  --   HGB 12.8   < > 11.6*   < > 11.1* 12.1 8.5*  HCT 42.5   < > 37.8   < > 37.5 38.3 27*  MCV 95.7  --  94.7  --   --  90.7  --   PLT 227  --  193  --   --  271.0 349   < > = values in this interval not displayed.   Cardiac Enzymes: No results for input(s): CKTOTAL, CKMB, CKMBINDEX, TROPONINI in the last 8760 hours. BNP: Invalid input(s): POCBNP Lab Results  Component Value Date   HGBA1C 7.1 (H) 03/26/2019   Lab Results  Component Value Date   TSH 4.98 (H) 03/26/2019   Lab Results  Component Value Date   VITAMINB12 186 (L) 08/17/2010   Lab Results  Component Value Date   FOLATE 12.0 08/17/2010   Lab Results  Component Value Date   IRON 60 06/17/2009    Imaging and Procedures obtained prior to SNF admission: Done at [facility] everywhere reviewed  Assessment/Plan 1. Closed burst fracture  of lumbar vertebra, initial encounter ([place]) -here for rehab, to wear TLSO brace when up -not c/o pain, has tylenol only  2. Acute respiratory failure with hypoxia and hypercapnia ([name]) -continues to require O2--when she can be weaned, we may transition her to lasix per d/c instructions -requires facility weight protocol to help manage this with 2x per week weights (not possible to weigh residents daily in this setting)  3. ATN (acute tubular necrosis) ([name]) -had improved with diuresis and resolution of sepsis -f/u cbc, bmp at one week  4. Acute on chronic diastolic CHF (congestive heart failure) ([place]) -continue diuresis with diamox until she can be weaned from O2 -still has rales on exam and requiring oxygen to maintain sats -needs to be on the twice a week chf protocol with parameters to call  5. H/O bad fall -with thoracic burst fx s/p surgery -cont prn tylenol, brace, PT, OT  6. Acute blood loss anemia -f/u cbc in one week  7. HCAP (healthcare-associated pneumonia) -completed abx and has had improvement, but continues to require oxygen--seems this is more due to volume overload than ongoing infection  8.  Dysphagia:  Suspect due to recent intubation -ST referral ordered to eval due to cough during meals which she's never had before  Family/ staff Communication: d/w snf nurse  Labs/tests ordered:  ST eval, cbc and bmp at one week  Mccayla Shimada L. Murl Golladay, D.O. [facility] [address], [place] [address] Cell Phone (Mon-Fri 8am-5pm):  [phone] On Call:  [phone] & follow prompts after 5pm & weekends Office Phone:  [phone] Office Fax:  [phone]

## 2020-04-06 DIAGNOSIS — D649 Anemia, unspecified: Secondary | ICD-10-CM | POA: Diagnosis not present

## 2020-04-07 DIAGNOSIS — J069 Acute upper respiratory infection, unspecified: Secondary | ICD-10-CM | POA: Diagnosis not present

## 2020-04-09 DIAGNOSIS — I10 Essential (primary) hypertension: Secondary | ICD-10-CM | POA: Diagnosis not present

## 2020-04-10 ENCOUNTER — Non-Acute Institutional Stay (SKILLED_NURSING_FACILITY): Payer: Medicare Other | Admitting: Adult Health

## 2020-04-10 ENCOUNTER — Encounter: Payer: Self-pay | Admitting: Adult Health

## 2020-04-10 DIAGNOSIS — S32001S Stable burst fracture of unspecified lumbar vertebra, sequela: Secondary | ICD-10-CM

## 2020-04-10 DIAGNOSIS — B370 Candidal stomatitis: Secondary | ICD-10-CM

## 2020-04-10 NOTE — Progress Notes (Signed)
Location:  [place] Room Number: 101-P Place of Service:  SNF (31) Provider:  Durenda Age, DNP, FNP-BC  Patient Care Team: Biagio Borg, MD as PCP - General Magrinat, Virgie Dad, MD as Consulting Physician (Oncology)  Extended Emergency Contact Information Primary Emergency Contact: Kalla,Arnold Address: [address]          [place], [place] [address] Johnnette Litter of [place] Phone: [phone] Mobile Phone: [phone] Relation: Spouse Secondary Emergency Contact: [place] Crane [place] of [place] Phone: [phone] Mobile Phone: [phone] Relation: Son  Code Status:  FULL CODE  Goals of care: Advanced Directive information Advanced Directives 04/03/2020  Does Patient Have a Medical Advance Directive? No  Type of Advance Directive -  Does patient want to make changes to medical advance directive? -  Would patient like information on creating a medical advance directive? No - Patient declined  Pre-existing out of facility DNR order (yellow form or pink MOST form) -     Chief Complaint  Patient presents with  . Acute Visit    Patient is seen for a wound check    HPI:  Pt is an 86 y.o. female seen today for wound check.  She has a PMH of atrial flutter, breast cancer, CKD, anxiety, anemia, CVA, DVT, gout and hiatal hernia. Staff reported that resident has refused 2 follow up appointments at [facility] S/P laminectomy and posterior spinal fusion, L2-L4, on 03/20/20. Husband was at bedside. Back midline surgical incision with staples intact. Slight slough at bottom incision, no redness noted. Minimal serous drainage was noted. No reported fever nor chills. Noted to have whitish and brownish coating on her tongue. She denies oral pain.   Past Medical History:  Diagnosis Date  . Anemia   . Anxiety   . Atrial flutter ([place])    "sometimes"  . Blood transfusion   . Breast cancer ([place])    right breast  .  Bronchitis   . Chronic kidney disease    occ uti's  . CKD (chronic kidney disease) stage 3, GFR 30-59 ml/min ([name])   . Colon polyps    hyperplastic  . CVA (cerebral infarction)   . Diverticulosis   . DVT of leg (deep venous thrombosis) ([name])    left; S/P OR  . Dysrhythmia    dr bensimhon   . Esophageal stricture   . GERD (gastroesophageal reflux disease)   . Gout   . Hiatal hernia    4 cm  . History of radiation therapy 02/15/12-04/02/12   right breast 4680 cGy/26 sessions, right boost=1400cGy /7 sessions  . Hyperlipidemia   . Hypertension   . IBS (irritable bowel syndrome)   . Kidney stones 1990's  . Morbid obesity ([place])   . Osteoarthritis   . Personal history of radiation therapy   . Pneumonia    "walking"  . PONV (postoperative nausea and vomiting)   . Shingles 02/21/2011  . Shortness of breath on exertion   . Skin cancer   . Stroke [facility]) 2002   residual:  "little tingling in palm of left hand"  . UTI (lower urinary tract infection)    "I've had a few"   Past Surgical History:  Procedure Laterality Date  . ABDOMINAL HYSTERECTOMY    . BILE DUCT STENT PLACEMENT  ~ 01/2011  . BREAST LUMPECTOMY  06/29/11   right  . BREAST LUMPECTOMY  06/29/2011   Procedure: BREAST LUMPECTOMY WITH EXCISION OF SENTINEL NODE;  Surgeon: Rodman Key  Verdie Drown, MD;  Location: [facility];  Service: General;  Laterality: Right;  right sentinel node mapping,right sentinel node biopsy, needle localization right breast lumpectomy  . CATARACT EXTRACTION W/ INTRAOCULAR LENS  IMPLANT, BILATERAL  1990's  . CHOLECYSTECTOMY  03/23/11   lap chole   . COLON SURGERY     hole in intestine ,tumors?  . colonic perforation repair  ~ 2000  . [place]  2012  . INCISION AND DRAINAGE ABSCESS N/A 08/12/2013   Procedure: INCISION AND DRAINAGE ABSCESS;  Surgeon: Earnstine Regal, MD;  Location: [facility];  Service: General;  Laterality: N/A;  . INCISION AND DRAINAGE ABSCESS N/A 08/20/2013   Procedure: INCISION AND DRAINAGE  ABSCESS;  Surgeon: Ralene Ok, MD;  Location: [facility];  Service: General;  Laterality: N/A;  . INCISION AND DRAINAGE BREAST ABSCESS     right breast seroma  . IRRIGATION AND DEBRIDEMENT ABSCESS N/A 08/14/2013   Procedure: Debridment of perineal tissue and debridement of perineal wound;  Surgeon: Earnstine Regal, MD;  Location: [facility];  Service: General;  Laterality: N/A;  . IRRIGATION AND DEBRIDEMENT ABSCESS N/A 08/16/2013   Procedure: DRESSING CHANGE AND DEBRIDEMENT OF PERINEUM WOUND;  Surgeon: Earnstine Regal, MD;  Location: [facility];  Service: General;  Laterality: N/A;  . JOINT REPLACEMENT    . MASS EXCISION Right 12/26/2018   Procedure: EXCISION RIGHT ARM SKIN MASS;  Surgeon: Erroll Luna, MD;  Location: [facility] [place];  Service: General;  Laterality: Right;  . MINOR APPLICATION OF WOUND VAC N/A 08/20/2013   Procedure: MINOR APPLICATION OF WOUND VAC;  Surgeon: Ralene Ok, MD;  Location: [facility];  Service: General;  Laterality: N/A;  . pelvic bone tumor removal     twice in 2000's  . PILONIDAL CYST DRAINAGE N/A 08/19/2013   Procedure: Dressing change perineum;  Surgeon: Ralene Ok, MD;  Location: [facility];  Service: General;  Laterality: N/A;  . REPLACEMENT TOTAL KNEE BILATERAL  ~ 2008; ~ 2010   right; left  . TONSILLECTOMY  ~ 1946  . TOTAL HIP ARTHROPLASTY  1980's   right    Allergies  Allergen Reactions  . Ace Inhibitors Other (See Comments)    Tolerates lisinopril at home. Pt does not recall allergy.  . Atorvastatin     REACTION: myalgias  . Oxycodone-Acetaminophen     REACTION: confusion, fatigue  . Rosuvastatin     REACTION: leg weakness  . Simvastatin     REACTION: leg weakness  . Codeine Nausea And Vomiting and Rash  . Sulfonamide Derivatives Hives and Rash    Outpatient Encounter Medications as of 04/10/2020  Medication Sig  . acetaminophen (TYLENOL) 325 MG tablet Take 650 mg by mouth every 6 (six) hours as needed (arthritis).   Marland Kitchen acetaZOLAMIDE  (DIAMOX) 250 MG tablet Take 250 mg by mouth 3 (three) times daily.  Marland Kitchen allopurinol (ZYLOPRIM) 100 MG tablet TAKE 2 TABLETS BY MOUTH ALTERNATING WITH 1 TABLET EVERY OTHER DAY  . allopurinol (ZYLOPRIM) 100 MG tablet Take 100 mg by mouth every other day.  Marland Kitchen amLODipine (NORVASC) 5 MG tablet TAKE 1 TABLET BY MOUTH ONCE A DAY  . aspirin 325 MG EC tablet Take 1 tablet (325 mg total) by mouth every other day.  . Ensure (ENSURE) Take 237 mLs by mouth 2 (two) times daily between meals.  Marland Kitchen ipratropium-albuterol (DUONEB) 0.5-2.5 (3) MG/3ML SOLN Take 3 mLs by nebulization in the morning and at bedtime. At Morning and Bedtime.  Marland Kitchen lisinopril (ZESTRIL) 40  MG tablet Take 40 mg by mouth daily.  . Magnesium Hydroxide (MILK OF MAGNESIA PO) Take 30 mLs by mouth daily.  . Multiple Vitamin (MULTIVITAMIN ADULT PO) Take 1 tablet by mouth daily.  Derrill Memo ON 04/11/2020] nystatin (MYCOSTATIN) 100000 UNIT/ML suspension Take 5 mLs by mouth 4 (four) times daily.   No facility-administered encounter medications on file as of 04/10/2020.    Review of Systems  GENERAL: No change in appetite, no fatigue, no weight changes, no fever, chills or weakness MOUTH and THROAT: Denies oral discomfort, gingival pain or bleeding RESPIRATORY: no cough, SOB, DOE, wheezing, hemoptysis CARDIAC: No chest pain, edema or palpitations GI: No abdominal pain, diarrhea, constipation, heart burn, nausea or vomiting NEUROLOGICAL: Denies dizziness, syncope, numbness, or headache PSYCHIATRIC: Denies feelings of depression or anxiety. No report of hallucinations, insomnia, paranoia, or agitation ENDOCRINE: Denies polyphagia, polyuria, polydipsia, heat or cold intolerance   Immunization History  Administered Date(s) Administered  . Fluad Quad(high Dose 65+) 05/31/2019  . Influenza Split 04/17/2012  . Influenza, High Dose Seasonal PF 06/19/2017  . Influenza,inj,Quad PF,6+ Mos 04/20/2016  . Influenza,inj,quad, With Preservative 03/13/2017  .  PFIZER SARS-COV-2 Vaccination 07/02/2019, 07/22/2019  . Pneumococcal Polysaccharide-23 06/17/2009  . Pneumococcal-Unspecified 06/13/2012  . Td 06/17/2009   Pertinent  Health Maintenance Due  Topic Date Due  . PNA vac Low Risk Adult (2 of 2 - PCV13) 06/13/2013  . OPHTHALMOLOGY EXAM  05/13/2017  . FOOT EXAM  06/26/2019  . INFLUENZA VACCINE  01/12/2020  . DEXA SCAN  Completed   Fall Risk  06/25/2018 06/19/2017 04/20/2016  Falls in the past year? 0 No No     Vitals:   04/10/20 1640  BP: 130/71  Pulse: 70  Resp: 18  Temp: 98 F (36.7 C)  TempSrc: Oral  SpO2: 96%  Weight: 216 lb (98 kg)  Height: 5\' 5"  (1.651 m)   Body mass index is 35.94 kg/m.  Physical Exam  GENERAL APPEARANCE: Well nourished. In no acute distress. Obese SKIN:  See HPI MOUTH and THROAT: Lips are without lesions. Oral mucosa is moist and without lesions. Tongue with whitish and brownish coating RESPIRATORY: Breathing is even & unlabored, BS CTAB, O2 @ 2L/min via [place] CARDIAC: RRR, no murmur,no extra heart sounds, no edema GI: Abdomen soft, normal BS, no masses, no tenderness GU:  With foley catheter Fr. 16 draining to urine bag with clear yellowish urine NEUROLOGICAL: There is no tremor. Speech is clear. Alert and oriented X 3. PSYCHIATRIC:  Affect and behavior are appropriate  Labs reviewed: Recent Labs    05/29/19 1413 05/29/19 1413 05/30/19 0755 05/30/19 0755 06/12/19 1539 03/29/20 0000 03/30/20 0000  NA 143   < > 141   < > 142 145 145  K 4.5   < > 4.5   < > 4.1 4.9 4.6  CL 103   < > 100   < > 102 106 108  CO2 29   < > 29   < > 31 32* 30*  GLUCOSE 118*  --  138*  --  125*  --   --   BUN 43*   < > 39*   < > 39* 53* 49*  CREATININE 1.42*   < > 1.34*   < > 1.48* 1.4* 1.3*  CALCIUM 9.7   < > 9.1   < > 9.8 9.3 9.2   < > = values in this interval not displayed.   Recent Labs    05/29/19 1413 05/30/19 0755  AST  20 21  ALT 16 13  ALKPHOS 94 70  BILITOT 0.7 0.9  PROT 7.8 6.3*  ALBUMIN 3.9 3.0*     Recent Labs    05/29/19 1413 05/29/19 2312 05/30/19 0228 05/30/19 0755 05/31/19 0812 06/12/19 1539 03/27/20 0000  WBC 10.2  --  9.5  --   --  9.7 8.7  NEUTROABS  --   --   --   --   --  6.5  --   HGB 12.8   < > 11.6*   < > 11.1* 12.1 8.5*  HCT 42.5   < > 37.8   < > 37.5 38.3 27*  MCV 95.7  --  94.7  --   --  90.7  --   PLT 227  --  193  --   --  271.0 349   < > = values in this interval not displayed.   Lab Results  Component Value Date   TSH 4.98 (H) 03/26/2019   Lab Results  Component Value Date   HGBA1C 7.1 (H) 03/26/2019   Lab Results  Component Value Date   CHOL 191 03/26/2019   HDL 36.00 (L) 03/26/2019   LDLCALC 124 (H) 06/25/2018   LDLDIRECT 129.0 03/26/2019   TRIG 232.0 (H) 03/26/2019   CHOLHDL 5 03/26/2019    Assessment/Plan  1. Oral candida -  Start Nystatin 100,000 units/ml suspension swab 5 ml to tongue QID X 2 weeks -  Oral care daily  2. Closed burst fracture of lumbar vertebra, sequela -  S/P laminectomy and fusion of L2-L4 on 03/20/20 -  No signs of infection on wound, continue wound treatment daily -  Follow up with neurosurgeon, discussed importance ofof follow-up with resident and agreed to follow up soon    Family/ staff Communication: Discussed plan of care with resident and charge nurse.  Labs/tests ordered: CBC on 04/13/20  Goals of care: Short-term care    Durenda Age, DNP, MSN, FNP-BC [facility] [phone] (Monday-Friday 8:00 a.m. - 5:00 p.m.) [phone] (after hours)

## 2020-04-13 DIAGNOSIS — D649 Anemia, unspecified: Secondary | ICD-10-CM | POA: Diagnosis not present

## 2020-04-14 DIAGNOSIS — Z981 Arthrodesis status: Secondary | ICD-10-CM | POA: Diagnosis not present

## 2020-04-14 DIAGNOSIS — Z4789 Encounter for other orthopedic aftercare: Secondary | ICD-10-CM | POA: Diagnosis not present

## 2020-04-21 DIAGNOSIS — Z9889 Other specified postprocedural states: Secondary | ICD-10-CM | POA: Diagnosis not present

## 2020-04-21 DIAGNOSIS — M2578 Osteophyte, vertebrae: Secondary | ICD-10-CM | POA: Diagnosis not present

## 2020-04-21 DIAGNOSIS — M8588 Other specified disorders of bone density and structure, other site: Secondary | ICD-10-CM | POA: Diagnosis not present

## 2020-04-21 DIAGNOSIS — S32001D Stable burst fracture of unspecified lumbar vertebra, subsequent encounter for fracture with routine healing: Secondary | ICD-10-CM | POA: Diagnosis not present

## 2020-04-22 ENCOUNTER — Emergency Department (HOSPITAL_COMMUNITY)
Admission: EM | Admit: 2020-04-22 | Discharge: 2020-04-22 | Disposition: A | Discharge status: Home / Self Care | Payer: Medicare Other | Attending: Emergency Medicine | Admitting: Emergency Medicine

## 2020-04-22 DIAGNOSIS — I5033 Acute on chronic diastolic (congestive) heart failure: Secondary | ICD-10-CM | POA: Insufficient documentation

## 2020-04-22 DIAGNOSIS — Z20822 Contact with and (suspected) exposure to covid-19: Secondary | ICD-10-CM | POA: Insufficient documentation

## 2020-04-22 DIAGNOSIS — E1122 Type 2 diabetes mellitus with diabetic chronic kidney disease: Secondary | ICD-10-CM | POA: Diagnosis not present

## 2020-04-22 DIAGNOSIS — I130 Hypertensive heart and chronic kidney disease with heart failure and stage 1 through stage 4 chronic kidney disease, or unspecified chronic kidney disease: Secondary | ICD-10-CM | POA: Insufficient documentation

## 2020-04-22 DIAGNOSIS — R1312 Dysphagia, oropharyngeal phase: Secondary | ICD-10-CM | POA: Diagnosis not present

## 2020-04-22 DIAGNOSIS — Z7982 Long term (current) use of aspirin: Secondary | ICD-10-CM | POA: Insufficient documentation

## 2020-04-22 DIAGNOSIS — B957 Other staphylococcus as the cause of diseases classified elsewhere: Secondary | ICD-10-CM | POA: Diagnosis not present

## 2020-04-22 DIAGNOSIS — R0902 Hypoxemia: Secondary | ICD-10-CM | POA: Diagnosis not present

## 2020-04-22 DIAGNOSIS — Z96653 Presence of artificial knee joint, bilateral: Secondary | ICD-10-CM | POA: Insufficient documentation

## 2020-04-22 DIAGNOSIS — M545 Low back pain, unspecified: Secondary | ICD-10-CM | POA: Diagnosis present

## 2020-04-22 DIAGNOSIS — Z85828 Personal history of other malignant neoplasm of skin: Secondary | ICD-10-CM | POA: Insufficient documentation

## 2020-04-22 DIAGNOSIS — X58XXXA Exposure to other specified factors, initial encounter: Secondary | ICD-10-CM | POA: Diagnosis not present

## 2020-04-22 DIAGNOSIS — Z8673 Personal history of transient ischemic attack (TIA), and cerebral infarction without residual deficits: Secondary | ICD-10-CM | POA: Diagnosis not present

## 2020-04-22 DIAGNOSIS — T8131XA Disruption of external operation (surgical) wound, not elsewhere classified, initial encounter: Secondary | ICD-10-CM | POA: Diagnosis not present

## 2020-04-22 DIAGNOSIS — T8141XA Infection following a procedure, superficial incisional surgical site, initial encounter: Secondary | ICD-10-CM | POA: Diagnosis not present

## 2020-04-22 DIAGNOSIS — Z79899 Other long term (current) drug therapy: Secondary | ICD-10-CM | POA: Diagnosis not present

## 2020-04-22 DIAGNOSIS — Z853 Personal history of malignant neoplasm of breast: Secondary | ICD-10-CM | POA: Insufficient documentation

## 2020-04-22 DIAGNOSIS — T8189XA Other complications of procedures, not elsewhere classified, initial encounter: Secondary | ICD-10-CM | POA: Diagnosis not present

## 2020-04-22 DIAGNOSIS — L8992 Pressure ulcer of unspecified site, stage 2: Secondary | ICD-10-CM | POA: Diagnosis not present

## 2020-04-22 DIAGNOSIS — R2689 Other abnormalities of gait and mobility: Secondary | ICD-10-CM | POA: Diagnosis not present

## 2020-04-22 DIAGNOSIS — N183 Chronic kidney disease, stage 3 unspecified: Secondary | ICD-10-CM | POA: Insufficient documentation

## 2020-04-22 DIAGNOSIS — N390 Urinary tract infection, site not specified: Secondary | ICD-10-CM | POA: Diagnosis not present

## 2020-04-22 DIAGNOSIS — R52 Pain, unspecified: Secondary | ICD-10-CM | POA: Diagnosis not present

## 2020-04-22 DIAGNOSIS — M6281 Muscle weakness (generalized): Secondary | ICD-10-CM | POA: Diagnosis not present

## 2020-04-22 DIAGNOSIS — R279 Unspecified lack of coordination: Secondary | ICD-10-CM | POA: Diagnosis not present

## 2020-04-22 DIAGNOSIS — I10 Essential (primary) hypertension: Secondary | ICD-10-CM | POA: Diagnosis not present

## 2020-04-22 DIAGNOSIS — T8149XA Infection following a procedure, other surgical site, initial encounter: Secondary | ICD-10-CM | POA: Diagnosis not present

## 2020-04-22 DIAGNOSIS — T8130XA Disruption of wound, unspecified, initial encounter: Secondary | ICD-10-CM | POA: Diagnosis not present

## 2020-04-22 DIAGNOSIS — T8463XA Infection and inflammatory reaction due to internal fixation device of spine, initial encounter: Secondary | ICD-10-CM | POA: Diagnosis not present

## 2020-04-22 DIAGNOSIS — R488 Other symbolic dysfunctions: Secondary | ICD-10-CM | POA: Diagnosis not present

## 2020-04-22 DIAGNOSIS — Z981 Arthrodesis status: Secondary | ICD-10-CM | POA: Diagnosis not present

## 2020-04-22 DIAGNOSIS — T8130XD Disruption of wound, unspecified, subsequent encounter: Secondary | ICD-10-CM | POA: Diagnosis not present

## 2020-04-22 DIAGNOSIS — B964 Proteus (mirabilis) (morganii) as the cause of diseases classified elsewhere: Secondary | ICD-10-CM | POA: Diagnosis not present

## 2020-04-22 DIAGNOSIS — Z743 Need for continuous supervision: Secondary | ICD-10-CM | POA: Diagnosis not present

## 2020-04-22 LAB — CBC WITH DIFFERENTIAL/PLATELET
Abs Immature Granulocytes: 0.03 10*3/uL (ref 0.00–0.07)
Basophils Absolute: 0.1 10*3/uL (ref 0.0–0.1)
Basophils Relative: 1 %
Eosinophils Absolute: 0.4 10*3/uL (ref 0.0–0.5)
Eosinophils Relative: 7 %
HCT: 40.8 % (ref 36.0–46.0)
Hemoglobin: 11.9 g/dL — ABNORMAL LOW (ref 12.0–15.0)
Immature Granulocytes: 1 %
Lymphocytes Relative: 21 %
Lymphs Abs: 1.3 10*3/uL (ref 0.7–4.0)
MCH: 28 pg (ref 26.0–34.0)
MCHC: 29.2 g/dL — ABNORMAL LOW (ref 30.0–36.0)
MCV: 96 fL (ref 80.0–100.0)
Monocytes Absolute: 0.6 10*3/uL (ref 0.1–1.0)
Monocytes Relative: 10 %
Neutro Abs: 3.8 10*3/uL (ref 1.7–7.7)
Neutrophils Relative %: 60 %
Platelets: 312 10*3/uL (ref 150–400)
RBC: 4.25 MIL/uL (ref 3.87–5.11)
RDW: 17.3 % — ABNORMAL HIGH (ref 11.5–15.5)
WBC: 6.1 10*3/uL (ref 4.0–10.5)
nRBC: 0 % (ref 0.0–0.2)

## 2020-04-22 LAB — BASIC METABOLIC PANEL
Anion gap: 13 (ref 5–15)
BUN: 51 mg/dL — ABNORMAL HIGH (ref 8–23)
CO2: 22 mmol/L (ref 22–32)
Calcium: 10.5 mg/dL — ABNORMAL HIGH (ref 8.9–10.3)
Chloride: 106 mmol/L (ref 98–111)
Creatinine, Ser: 1.06 mg/dL — ABNORMAL HIGH (ref 0.44–1.00)
GFR, Estimated: 51 mL/min — ABNORMAL LOW (ref >60)
Glucose, Bld: 108 mg/dL — ABNORMAL HIGH (ref 70–99)
Potassium: 4 mmol/L (ref 3.5–5.1)
Sodium: 141 mmol/L (ref 135–145)

## 2020-04-22 LAB — RESPIRATORY PANEL BY RT PCR (FLU A&B, COVID)
Influenza A by PCR: NEGATIVE
Influenza B by PCR: NEGATIVE
SARS Coronavirus 2 by RT PCR: NEGATIVE

## 2020-04-22 MED ORDER — FENTANYL CITRATE (PF) 100 MCG/2ML IJ SOLN
50.0000 ug | INTRAMUSCULAR | Status: DC | PRN
  Administered 2020-04-22 (×2): 50 ug via INTRAVENOUS
  Filled 2020-04-22 (×2): qty 2

## 2020-04-22 MED ORDER — CEFAZOLIN SODIUM-DEXTROSE 2-4 GM/100ML-% IV SOLN
2.0000 g | Freq: Once | INTRAVENOUS | Status: AC
  Administered 2020-04-22: 2 g via INTRAVENOUS
  Filled 2020-04-22: qty 100

## 2020-04-22 NOTE — ED Triage Notes (Signed)
PT BIB by [facility] after [facility] called to report that pt's surgical site on lower back had reopened post fall last evening.PT reports being on aspirin daily and that she did fall and hit the back of her head. Surgery was completed at [facility], EMS unsure what date.   Temp 97.7 B/P 155/58 RR 21 100% 2L   HR 68

## 2020-04-22 NOTE — ED Notes (Signed)
Help get patient on the monitor patient is resting with call bell in reach 

## 2020-04-22 NOTE — ED Notes (Signed)
[facility] called to update [place] on patient this RN spoke with [name] and she stated that patient did not have a fall at their facility last night however she did have a doctors appointment yesterday for a followup and she was sliding out of the chair, they assisted her back to a sitting position and that is where they suspect the dehiscence may have occurred, but their was not fall documented at the facility last night.

## 2020-04-22 NOTE — ED Notes (Signed)
[name]

## 2020-04-22 NOTE — ED Notes (Signed)
Patient left with [facility] 

## 2020-04-22 NOTE — ED Provider Notes (Signed)
[facility] EMERGENCY DEPARTMENT Provider Note   CSN: [id number] Arrival date & time: 04/22/20  1207     History Chief Complaint  Patient presents with  . Fall    Reopened surgical site on lower back     [name] is a 86 y.o. female.  She is here by EMS from [facility] for for evaluation of her back wound.  Per EMS the patient fell yesterday and staff noted today that her incision had to be opened.  Patient denies a fall.  She said she fell last month and ended up needing spinal surgery at [facility].  She saw her doctor yesterday and the wound did open somewhat.  She denies any new pain.  She has continued weakness in her lower extremities and she is at rehab to work on getting her strength back.  No headache or chest pain.  The history is provided by the patient and the EMS personnel.  Back Pain Location:  Lumbar spine Quality:  Stabbing Radiates to:  Does not radiate Pain severity:  Moderate Pain is:  Same all the time Onset quality:  Unable to specify Timing:  Constant Progression:  Unchanged Context: recent injury   Worsened by:  Twisting and bending Associated symptoms: numbness (baseline) and weakness (baseline)   Associated symptoms: no abdominal pain, no chest pain, no dysuria and no fever        Past Medical History:  Diagnosis Date  . Anemia   . Anxiety   . Atrial flutter ([place])    "sometimes"  . Blood transfusion   . Breast cancer ([place])    right breast  . Bronchitis   . Chronic kidney disease    occ uti's  . CKD (chronic kidney disease) stage 3, GFR 30-59 ml/min ([name])   . Colon polyps    hyperplastic  . CVA (cerebral infarction)   . Diverticulosis   . DVT of leg (deep venous thrombosis) ([name])    left; S/P OR  . Dysrhythmia    dr bensimhon   . Esophageal stricture   . GERD (gastroesophageal reflux disease)   . Gout   . Hiatal hernia    4 cm  . History of radiation therapy 02/15/12-04/02/12   right breast 4680 cGy/26 sessions,  right boost=1400cGy /7 sessions  . Hyperlipidemia   . Hypertension   . IBS (irritable bowel syndrome)   . Kidney stones 1990's  . Morbid obesity ([place])   . Osteoarthritis   . Personal history of radiation therapy   . Pneumonia    "walking"  . PONV (postoperative nausea and vomiting)   . Shingles 02/21/2011  . Shortness of breath on exertion   . Skin cancer   . Stroke [facility]) 2002   residual:  "little tingling in palm of left hand"  . UTI (lower urinary tract infection)    "I've had a few"    Patient Active Problem List   Diagnosis Date Noted  . Closed burst fracture of lumbar vertebra ([place]) 04/03/2020  . Acute respiratory failure with hypoxia and hypercapnia ([place]) 04/03/2020  . ATN (acute tubular necrosis) ([place]) 04/03/2020  . H/O bad fall 04/03/2020  . HCAP (healthcare-associated pneumonia) 04/03/2020  . Hematochezia 05/29/2019  . Diverticulosis   . Swelling of vagina 04/16/2019  . Skin lesion 03/26/2019  . Left leg cellulitis 01/15/2019  . Healed diabetic foot ulcer 12/01/2017  . Abnormal TSH 11/15/2016  . Preventative health care 04/20/2016  . Allergic rhinitis 04/20/2016  . Dysuria 12/09/2014  .  External hemorrhoids without complication 40/98/1191  . Left ear pain 07/08/2014  . Hip pain   . GIB (gastrointestinal bleeding) 06/18/2014  . UTI (lower urinary tract infection) 06/18/2014  . Abdominal pain   . Lower GI bleed   . Arthritis 02/18/2014  . Enteritis due to Clostridium difficile 10/12/2013  . CKD (chronic kidney disease) stage 3, GFR 30-59 ml/min ([name]) 10/11/2013  . Chronic diastolic CHF (congestive heart failure) ([place]) 10/11/2013  . Protein-calorie malnutrition, severe ([place]) 10/11/2013  . Hx of necrotizing fasciitis 10/10/2013  . GI bleed 10/10/2013  . AKI (acute kidney injury) ([place]) 10/10/2013  . Cough 09/10/2013  . Rectal bleeding 09/10/2013  . Acute on chronic diastolic CHF (congestive heart failure) ([place]) 08/31/2013  . Rash and nonspecific skin eruption  08/27/2013  . Pulmonary edema 08/25/2013  . Severe sepsis with septic shock ([place]) 08/13/2013  . Necrotizing fasciitis of perineum 08/12/2013  . Acute on chronic kidney disease, stage 3 08/12/2013  . Wheezing 06/07/2013  . Wound healing, delayed 01/26/2012  . Diverticulosis of colon with hemorrhage 12/19/2011  . Acute blood loss anemia 12/19/2011  . Malignant neoplasm of upper-outer quadrant of right breast in female, estrogen receptor positive ([place]) 08/01/2011  . Breast cancer, right breast-lobular s/p lumpectomy and sentinel node biopsy 06/30/2011  . Shingles 02/21/2011  . Bile duct calculus with nonacute cholecystitis 01/21/2011  . Morbid obesity ([place]) 01/21/2011  . TREMOR 08/17/2010  . Diabetes ([place]) 01/09/2008  . PERIPHERAL NEUROPATHY 10/17/2007  . FATIGUE 10/17/2007  . COLONIC POLYPS, HX OF 02/12/2007  . Hyperlipidemia 02/10/2007  . Gout 02/10/2007  . ANXIETY 02/10/2007  . Essential hypertension 02/10/2007  . CVA 02/10/2007  . DIVERTICULOSIS, COLON 02/10/2007  . IRRITABLE BOWEL, PREDOMINANTLY CONSTIPATION 02/10/2007    Past Surgical History:  Procedure Laterality Date  . ABDOMINAL HYSTERECTOMY    . BILE DUCT STENT PLACEMENT  ~ 01/2011  . BREAST LUMPECTOMY  06/29/11   right  . BREAST LUMPECTOMY  06/29/2011   Procedure: BREAST LUMPECTOMY WITH EXCISION OF SENTINEL NODE;  Surgeon: Pedro Earls, MD;  Location: [place];  Service: General;  Laterality: Right;  right sentinel node mapping,right sentinel node biopsy, needle localization right breast lumpectomy  . CATARACT EXTRACTION W/ INTRAOCULAR LENS  IMPLANT, BILATERAL  1990's  . CHOLECYSTECTOMY  03/23/11   lap chole   . COLON SURGERY     hole in intestine ,tumors?  . colonic perforation repair  ~ 2000  . [place]  2012  . INCISION AND DRAINAGE ABSCESS N/A 08/12/2013   Procedure: INCISION AND DRAINAGE ABSCESS;  Surgeon: Earnstine Regal, MD;  Location: [facility];  Service: General;  Laterality: N/A;  . INCISION AND  DRAINAGE ABSCESS N/A 08/20/2013   Procedure: INCISION AND DRAINAGE ABSCESS;  Surgeon: Ralene Ok, MD;  Location: [facility];  Service: General;  Laterality: N/A;  . INCISION AND DRAINAGE BREAST ABSCESS     right breast seroma  . IRRIGATION AND DEBRIDEMENT ABSCESS N/A 08/14/2013   Procedure: Debridment of perineal tissue and debridement of perineal wound;  Surgeon: Earnstine Regal, MD;  Location: [facility];  Service: General;  Laterality: N/A;  . IRRIGATION AND DEBRIDEMENT ABSCESS N/A 08/16/2013   Procedure: DRESSING CHANGE AND DEBRIDEMENT OF PERINEUM WOUND;  Surgeon: Earnstine Regal, MD;  Location: [facility];  Service: General;  Laterality: N/A;  . JOINT REPLACEMENT    . MASS EXCISION Right 12/26/2018   Procedure: EXCISION RIGHT ARM SKIN MASS;  Surgeon: Erroll Luna, MD;  Location: [place];  Service: General;  Laterality: Right;  . MINOR APPLICATION OF WOUND VAC N/A 08/20/2013   Procedure: MINOR APPLICATION OF WOUND VAC;  Surgeon: Ralene Ok, MD;  Location: [facility];  Service: General;  Laterality: N/A;  . pelvic bone tumor removal     twice in 2000's  . PILONIDAL CYST DRAINAGE N/A 08/19/2013   Procedure: Dressing change perineum;  Surgeon: Ralene Ok, MD;  Location: [facility];  Service: General;  Laterality: N/A;  . REPLACEMENT TOTAL KNEE BILATERAL  ~ 2008; ~ 2010   right; left  . TONSILLECTOMY  ~ 1946  . TOTAL HIP ARTHROPLASTY  1980's   right     OB History   No obstetric history on file.     Family History  Problem Relation Age of Onset  . Breast cancer Sister        x 2  . Prostate cancer Brother   . Stroke Mother   . Kidney failure Sister   . Diabetes Sister   . Heart disease Paternal Uncle        multiple  . Heart disease Maternal Uncle        multiple  . Colon cancer Neg Hx     Social History   Tobacco Use  . Smoking status: Never Smoker  . Smokeless tobacco: Never Used  Substance Use Topics  . Alcohol use: No  . Drug use: No    Home  Medications Prior to Admission medications   Medication Sig Start Date End Date Taking? Authorizing Provider  acetaminophen (TYLENOL) 325 MG tablet Take 650 mg by mouth every 6 (six) hours as needed (arthritis).     [provider]  acetaZOLAMIDE (DIAMOX) 250 MG tablet Take 250 mg by mouth 3 (three) times daily.    [provider]  allopurinol (ZYLOPRIM) 100 MG tablet TAKE 2 TABLETS BY MOUTH ALTERNATING WITH 1 TABLET EVERY OTHER DAY 08/05/19   Biagio Borg, MD  allopurinol (ZYLOPRIM) 100 MG tablet Take 100 mg by mouth every other day.    [provider]  amLODipine (NORVASC) 5 MG tablet TAKE 1 TABLET BY MOUTH ONCE A DAY 01/27/20   Biagio Borg, MD  aspirin 325 MG EC tablet Take 1 tablet (325 mg total) by mouth every other day. 06/23/14   Hosie Poisson, MD  Ensure (ENSURE) Take 237 mLs by mouth 2 (two) times daily between meals.    [provider]  ipratropium-albuterol (DUONEB) 0.5-2.5 (3) MG/3ML SOLN Take 3 mLs by nebulization in the morning and at bedtime. At Morning and Bedtime.    [provider]  lisinopril (ZESTRIL) 40 MG tablet Take 40 mg by mouth daily.    [provider]  Magnesium Hydroxide (MILK OF MAGNESIA PO) Take 30 mLs by mouth daily.    [provider]  Multiple Vitamin (MULTIVITAMIN ADULT PO) Take 1 tablet by mouth daily.    [provider]  nystatin (MYCOSTATIN) 100000 UNIT/ML suspension Take 5 mLs by mouth 4 (four) times daily. 04/11/20 04/24/20  [provider]    Allergies    Ace inhibitors, Atorvastatin, Oxycodone-acetaminophen, Rosuvastatin, Simvastatin, Codeine, and Sulfonamide derivatives  Review of Systems   Review of Systems  Constitutional: Negative for fever.  HENT: Negative for sore throat.   Eyes: Negative for visual disturbance.  Respiratory: Negative for shortness of breath.   Cardiovascular: Negative for chest pain.  Gastrointestinal: Negative for abdominal pain.   Genitourinary: Negative for dysuria.  Musculoskeletal: Positive for back pain.  Skin: Negative for  rash.  Neurological: Positive for weakness (baseline) and numbness (baseline).    Physical Exam Updated Vital Signs BP (!) 151/58 (BP Location: Right Arm)   Pulse 75   Temp 97.7 F (36.5 C) (Oral)   Resp 20   SpO2 100%   Physical Exam Vitals and nursing note reviewed.  Constitutional:      General: She is not in acute distress.    Appearance: She is well-developed.  HENT:     Head: Normocephalic and atraumatic.  Eyes:     Conjunctiva/sclera: Conjunctivae normal.  Cardiovascular:     Rate and Rhythm: Normal rate and regular rhythm.     Heart sounds: No murmur heard.   Pulmonary:     Effort: Pulmonary effort is normal. No respiratory distress.     Breath sounds: Normal breath sounds.  Abdominal:     Palpations: Abdomen is soft.     Tenderness: There is no abdominal tenderness.  Musculoskeletal:     Cervical back: Neck supple.     Comments: She has a midline lumbar surgical incision with pink Curlex in the wound.  Skin:    General: Skin is warm and dry.  Neurological:     Mental Status: She is alert and oriented to person, place, and time.     Sensory: Sensory deficit present.     Motor: Weakness present.     Comments: She is able to move her toes.  She is able to barely lift her leg off the bed against gravity.  There is decrease sensation in her toes.  She says this is baseline for her since her accident.        ED Results / Procedures / Treatments   Labs (all labs ordered are listed, but only abnormal results are displayed) Labs Reviewed  BASIC METABOLIC PANEL - Abnormal; Notable for the following components:      Result Value   Glucose, Bld 108 (*)    BUN 51 (*)    Creatinine, Ser 1.06 (*)    Calcium 10.5 (*)    GFR, Estimated 51 (*)    All other components within normal limits  CBC WITH DIFFERENTIAL/PLATELET - Abnormal; Notable for the following  components:   Hemoglobin 11.9 (*)    MCHC 29.2 (*)    RDW 17.3 (*)    All other components within normal limits  RESPIRATORY PANEL BY RT PCR (FLU A&B, COVID)    EKG None  Radiology No results found.  Procedures Procedures (including critical care time)  Medications Ordered in ED Medications  fentaNYL (SUBLIMAZE) injection 50 mcg (50 mcg Intravenous Given 04/22/20 1712)  ceFAZolin (ANCEF) IVPB 2g/100 mL premix (0 g Intravenous Stopped 04/22/20 1433)    ED Course  I have reviewed the triage vital signs and the nursing notes.  Pertinent labs & imaging results that were available during my care of the patient were reviewed by me and considered in my medical decision making (see chart for details).  Clinical Course as of Apr 22 1730  Wed Apr 22, 2020  1303 Patient son is here now unable to give a little bit more information.  Patient had follow-up appointment yesterday in the physician said everything looked good.  Staff last evening noticed that she had some blood on the dressing and when they took it down found the wound had opened up.  They talk to neurosurgery who recommended a wet Kerlix dressing and for her to return to the clinic tomorrow for reassessment.  Her son was  worried about the amount of bleeding and elected to bring her here to the emergency department.  There was no recent falls.   [MB]  0454 Discussed with Dr. Willette Alma neurosurgery at [facility].  He asked for the patient to be given Kefzol and transferred ED to ED so neurosurgery can assess the wound.  I talked with Dr. Werner Lean ED physician who accepts the patient ED to ED for neurosurgical evaluation.   [MB]  1325 I updated the son and the patient regarding neurosurgery recommendations and they are comfortable with plan for transfer.  He is going back to the facility to get her brace and then we will arrange ambulance transfer.   [MB]    Clinical Course User Index [MB] Hayden Rasmussen, MD   MDM  Rules/Calculators/A&P                         This patient complains of opening of lumbar incision; this involves an extensive number of treatment Options and is a complaint that carries with it a high risk of complications and Morbidity. The differential includes wound dehiscence, wound infection, seroma, CSF leak  I ordered, reviewed and interpreted labs, which included CBC with normal white count, low hemoglobin but better than baseline, Covid testing negative I ordered medication IV Kefzol  Additional history obtained from nursing facility, EMS, family members Previous records obtained and reviewed op note and patient follow-up at [facility] I consulted Dr. Tivis Ringer neurosurgery at [facility] and discussed lab and imaging findings  Critical Interventions: None  After the interventions stated above, I reevaluated the patient and found patient to be comfortable plan for discharge.  Her son is going to get her her brace and then ambulance will be called for transfer to [facility] for neurosurgical evaluation.   Final Clinical Impression(s) / ED Diagnoses Final diagnoses:  Wound dehiscence, surgical, initial encounter    Rx / DC Orders ED Discharge Orders    None       Hayden Rasmussen, MD 04/22/20 1736

## 2020-04-24 DIAGNOSIS — T8130XA Disruption of wound, unspecified, initial encounter: Secondary | ICD-10-CM | POA: Diagnosis not present

## 2020-05-02 DIAGNOSIS — R488 Other symbolic dysfunctions: Secondary | ICD-10-CM | POA: Diagnosis not present

## 2020-05-02 DIAGNOSIS — S32001D Stable burst fracture of unspecified lumbar vertebra, subsequent encounter for fracture with routine healing: Secondary | ICD-10-CM | POA: Diagnosis not present

## 2020-05-02 DIAGNOSIS — J9601 Acute respiratory failure with hypoxia: Secondary | ICD-10-CM | POA: Diagnosis not present

## 2020-05-02 DIAGNOSIS — Z882 Allergy status to sulfonamides status: Secondary | ICD-10-CM | POA: Diagnosis not present

## 2020-05-02 DIAGNOSIS — T8141XA Infection following a procedure, superficial incisional surgical site, initial encounter: Secondary | ICD-10-CM | POA: Diagnosis not present

## 2020-05-02 DIAGNOSIS — Z960 Presence of urogenital implants: Secondary | ICD-10-CM | POA: Diagnosis not present

## 2020-05-02 DIAGNOSIS — Z4800 Encounter for change or removal of nonsurgical wound dressing: Secondary | ICD-10-CM | POA: Diagnosis not present

## 2020-05-02 DIAGNOSIS — T8189XA Other complications of procedures, not elsewhere classified, initial encounter: Secondary | ICD-10-CM | POA: Diagnosis not present

## 2020-05-02 DIAGNOSIS — T8130XD Disruption of wound, unspecified, subsequent encounter: Secondary | ICD-10-CM | POA: Diagnosis not present

## 2020-05-02 DIAGNOSIS — I5031 Acute diastolic (congestive) heart failure: Secondary | ICD-10-CM | POA: Diagnosis not present

## 2020-05-02 DIAGNOSIS — Z09 Encounter for follow-up examination after completed treatment for conditions other than malignant neoplasm: Secondary | ICD-10-CM | POA: Diagnosis not present

## 2020-05-02 DIAGNOSIS — M6281 Muscle weakness (generalized): Secondary | ICD-10-CM | POA: Diagnosis not present

## 2020-05-02 DIAGNOSIS — G9600 Cerebrospinal fluid leak, unspecified: Secondary | ICD-10-CM | POA: Diagnosis not present

## 2020-05-02 DIAGNOSIS — S31000D Unspecified open wound of lower back and pelvis without penetration into retroperitoneum, subsequent encounter: Secondary | ICD-10-CM | POA: Diagnosis not present

## 2020-05-02 DIAGNOSIS — R1312 Dysphagia, oropharyngeal phase: Secondary | ICD-10-CM | POA: Diagnosis not present

## 2020-05-02 DIAGNOSIS — Z969 Presence of functional implant, unspecified: Secondary | ICD-10-CM | POA: Diagnosis not present

## 2020-05-02 DIAGNOSIS — R410 Disorientation, unspecified: Secondary | ICD-10-CM | POA: Diagnosis not present

## 2020-05-02 DIAGNOSIS — R0902 Hypoxemia: Secondary | ICD-10-CM | POA: Diagnosis not present

## 2020-05-02 DIAGNOSIS — S31109D Unspecified open wound of abdominal wall, unspecified quadrant without penetration into peritoneal cavity, subsequent encounter: Secondary | ICD-10-CM | POA: Diagnosis not present

## 2020-05-02 DIAGNOSIS — L089 Local infection of the skin and subcutaneous tissue, unspecified: Secondary | ICD-10-CM | POA: Diagnosis not present

## 2020-05-02 DIAGNOSIS — R2689 Other abnormalities of gait and mobility: Secondary | ICD-10-CM | POA: Diagnosis not present

## 2020-05-02 DIAGNOSIS — T8149XA Infection following a procedure, other surgical site, initial encounter: Secondary | ICD-10-CM | POA: Diagnosis not present

## 2020-05-02 DIAGNOSIS — Z743 Need for continuous supervision: Secondary | ICD-10-CM | POA: Diagnosis not present

## 2020-05-02 DIAGNOSIS — Z978 Presence of other specified devices: Secondary | ICD-10-CM | POA: Diagnosis not present

## 2020-05-02 DIAGNOSIS — Z967 Presence of other bone and tendon implants: Secondary | ICD-10-CM | POA: Diagnosis not present

## 2020-05-02 DIAGNOSIS — Z885 Allergy status to narcotic agent status: Secondary | ICD-10-CM | POA: Diagnosis not present

## 2020-05-02 DIAGNOSIS — M726 Necrotizing fasciitis: Secondary | ICD-10-CM | POA: Diagnosis not present

## 2020-05-02 DIAGNOSIS — R279 Unspecified lack of coordination: Secondary | ICD-10-CM | POA: Diagnosis not present

## 2020-05-02 DIAGNOSIS — R5381 Other malaise: Secondary | ICD-10-CM | POA: Diagnosis not present

## 2020-05-02 DIAGNOSIS — I10 Essential (primary) hypertension: Secondary | ICD-10-CM | POA: Diagnosis not present

## 2020-05-02 DIAGNOSIS — Z888 Allergy status to other drugs, medicaments and biological substances status: Secondary | ICD-10-CM | POA: Diagnosis not present

## 2020-05-05 DIAGNOSIS — T8189XA Other complications of procedures, not elsewhere classified, initial encounter: Secondary | ICD-10-CM | POA: Diagnosis not present

## 2020-05-05 DIAGNOSIS — R5381 Other malaise: Secondary | ICD-10-CM | POA: Diagnosis not present

## 2020-05-05 DIAGNOSIS — M726 Necrotizing fasciitis: Secondary | ICD-10-CM | POA: Diagnosis not present

## 2020-05-05 DIAGNOSIS — I5031 Acute diastolic (congestive) heart failure: Secondary | ICD-10-CM | POA: Diagnosis not present

## 2020-05-05 DIAGNOSIS — J9601 Acute respiratory failure with hypoxia: Secondary | ICD-10-CM | POA: Diagnosis not present

## 2020-05-15 DIAGNOSIS — T8141XA Infection following a procedure, superficial incisional surgical site, initial encounter: Secondary | ICD-10-CM | POA: Diagnosis not present

## 2020-05-15 DIAGNOSIS — G9600 Cerebrospinal fluid leak, unspecified: Secondary | ICD-10-CM | POA: Diagnosis not present

## 2020-05-15 DIAGNOSIS — Z967 Presence of other bone and tendon implants: Secondary | ICD-10-CM | POA: Diagnosis not present

## 2020-05-15 DIAGNOSIS — Z978 Presence of other specified devices: Secondary | ICD-10-CM | POA: Diagnosis not present

## 2020-05-15 DIAGNOSIS — S31109D Unspecified open wound of abdominal wall, unspecified quadrant without penetration into peritoneal cavity, subsequent encounter: Secondary | ICD-10-CM | POA: Diagnosis not present

## 2020-05-15 DIAGNOSIS — L089 Local infection of the skin and subcutaneous tissue, unspecified: Secondary | ICD-10-CM | POA: Diagnosis not present

## 2020-05-15 DIAGNOSIS — S32001D Stable burst fracture of unspecified lumbar vertebra, subsequent encounter for fracture with routine healing: Secondary | ICD-10-CM | POA: Diagnosis not present

## 2020-05-15 DIAGNOSIS — T8149XA Infection following a procedure, other surgical site, initial encounter: Secondary | ICD-10-CM | POA: Diagnosis not present

## 2020-05-15 DIAGNOSIS — Z960 Presence of urogenital implants: Secondary | ICD-10-CM | POA: Diagnosis not present

## 2020-05-15 DIAGNOSIS — S31000D Unspecified open wound of lower back and pelvis without penetration into retroperitoneum, subsequent encounter: Secondary | ICD-10-CM | POA: Diagnosis not present

## 2020-05-15 DIAGNOSIS — Z4800 Encounter for change or removal of nonsurgical wound dressing: Secondary | ICD-10-CM | POA: Diagnosis not present

## 2020-05-15 DIAGNOSIS — Z888 Allergy status to other drugs, medicaments and biological substances status: Secondary | ICD-10-CM | POA: Diagnosis not present

## 2020-05-15 DIAGNOSIS — Z09 Encounter for follow-up examination after completed treatment for conditions other than malignant neoplasm: Secondary | ICD-10-CM | POA: Diagnosis not present

## 2020-05-15 DIAGNOSIS — Z882 Allergy status to sulfonamides status: Secondary | ICD-10-CM | POA: Diagnosis not present

## 2020-05-15 DIAGNOSIS — Z969 Presence of functional implant, unspecified: Secondary | ICD-10-CM | POA: Diagnosis not present

## 2020-05-15 DIAGNOSIS — Z885 Allergy status to narcotic agent status: Secondary | ICD-10-CM | POA: Diagnosis not present

## 2020-05-15 DIAGNOSIS — R410 Disorientation, unspecified: Secondary | ICD-10-CM | POA: Diagnosis not present

## 2020-05-18 DIAGNOSIS — I10 Essential (primary) hypertension: Secondary | ICD-10-CM | POA: Diagnosis not present

## 2020-05-22 DIAGNOSIS — I10 Essential (primary) hypertension: Secondary | ICD-10-CM | POA: Diagnosis not present

## 2020-05-24 DIAGNOSIS — N390 Urinary tract infection, site not specified: Secondary | ICD-10-CM | POA: Diagnosis not present

## 2020-05-25 DIAGNOSIS — I10 Essential (primary) hypertension: Secondary | ICD-10-CM | POA: Diagnosis not present

## 2020-05-26 DIAGNOSIS — M6281 Muscle weakness (generalized): Secondary | ICD-10-CM | POA: Diagnosis not present

## 2020-05-26 DIAGNOSIS — I5031 Acute diastolic (congestive) heart failure: Secondary | ICD-10-CM | POA: Diagnosis not present

## 2020-05-26 DIAGNOSIS — T8130XD Disruption of wound, unspecified, subsequent encounter: Secondary | ICD-10-CM | POA: Diagnosis not present

## 2020-05-26 DIAGNOSIS — R2689 Other abnormalities of gait and mobility: Secondary | ICD-10-CM | POA: Diagnosis not present

## 2020-05-26 DIAGNOSIS — R488 Other symbolic dysfunctions: Secondary | ICD-10-CM | POA: Diagnosis not present

## 2020-05-26 DIAGNOSIS — R279 Unspecified lack of coordination: Secondary | ICD-10-CM | POA: Diagnosis not present

## 2020-05-26 DIAGNOSIS — J9601 Acute respiratory failure with hypoxia: Secondary | ICD-10-CM | POA: Diagnosis not present

## 2020-05-27 DIAGNOSIS — R488 Other symbolic dysfunctions: Secondary | ICD-10-CM | POA: Diagnosis not present

## 2020-05-27 DIAGNOSIS — R2689 Other abnormalities of gait and mobility: Secondary | ICD-10-CM | POA: Diagnosis not present

## 2020-05-27 DIAGNOSIS — R279 Unspecified lack of coordination: Secondary | ICD-10-CM | POA: Diagnosis not present

## 2020-05-27 DIAGNOSIS — T8130XD Disruption of wound, unspecified, subsequent encounter: Secondary | ICD-10-CM | POA: Diagnosis not present

## 2020-05-27 DIAGNOSIS — M6281 Muscle weakness (generalized): Secondary | ICD-10-CM | POA: Diagnosis not present

## 2020-05-28 DIAGNOSIS — R488 Other symbolic dysfunctions: Secondary | ICD-10-CM | POA: Diagnosis not present

## 2020-05-28 DIAGNOSIS — R2689 Other abnormalities of gait and mobility: Secondary | ICD-10-CM | POA: Diagnosis not present

## 2020-05-28 DIAGNOSIS — N390 Urinary tract infection, site not specified: Secondary | ICD-10-CM | POA: Diagnosis not present

## 2020-05-28 DIAGNOSIS — R5381 Other malaise: Secondary | ICD-10-CM | POA: Diagnosis not present

## 2020-05-28 DIAGNOSIS — R279 Unspecified lack of coordination: Secondary | ICD-10-CM | POA: Diagnosis not present

## 2020-05-28 DIAGNOSIS — M6281 Muscle weakness (generalized): Secondary | ICD-10-CM | POA: Diagnosis not present

## 2020-05-28 DIAGNOSIS — T8130XD Disruption of wound, unspecified, subsequent encounter: Secondary | ICD-10-CM | POA: Diagnosis not present

## 2020-05-29 DIAGNOSIS — R279 Unspecified lack of coordination: Secondary | ICD-10-CM | POA: Diagnosis not present

## 2020-05-29 DIAGNOSIS — R488 Other symbolic dysfunctions: Secondary | ICD-10-CM | POA: Diagnosis not present

## 2020-05-29 DIAGNOSIS — R2689 Other abnormalities of gait and mobility: Secondary | ICD-10-CM | POA: Diagnosis not present

## 2020-05-29 DIAGNOSIS — T8130XD Disruption of wound, unspecified, subsequent encounter: Secondary | ICD-10-CM | POA: Diagnosis not present

## 2020-05-29 DIAGNOSIS — M6281 Muscle weakness (generalized): Secondary | ICD-10-CM | POA: Diagnosis not present

## 2020-05-30 DIAGNOSIS — R2689 Other abnormalities of gait and mobility: Secondary | ICD-10-CM | POA: Diagnosis not present

## 2020-05-30 DIAGNOSIS — M6281 Muscle weakness (generalized): Secondary | ICD-10-CM | POA: Diagnosis not present

## 2020-05-30 DIAGNOSIS — T8130XD Disruption of wound, unspecified, subsequent encounter: Secondary | ICD-10-CM | POA: Diagnosis not present

## 2020-05-30 DIAGNOSIS — R488 Other symbolic dysfunctions: Secondary | ICD-10-CM | POA: Diagnosis not present

## 2020-05-30 DIAGNOSIS — R279 Unspecified lack of coordination: Secondary | ICD-10-CM | POA: Diagnosis not present

## 2020-05-31 DIAGNOSIS — T8130XD Disruption of wound, unspecified, subsequent encounter: Secondary | ICD-10-CM | POA: Diagnosis not present

## 2020-05-31 DIAGNOSIS — R279 Unspecified lack of coordination: Secondary | ICD-10-CM | POA: Diagnosis not present

## 2020-05-31 DIAGNOSIS — M6281 Muscle weakness (generalized): Secondary | ICD-10-CM | POA: Diagnosis not present

## 2020-05-31 DIAGNOSIS — R2689 Other abnormalities of gait and mobility: Secondary | ICD-10-CM | POA: Diagnosis not present

## 2020-05-31 DIAGNOSIS — R488 Other symbolic dysfunctions: Secondary | ICD-10-CM | POA: Diagnosis not present

## 2020-06-01 DIAGNOSIS — T8130XD Disruption of wound, unspecified, subsequent encounter: Secondary | ICD-10-CM | POA: Diagnosis not present

## 2020-06-01 DIAGNOSIS — R2689 Other abnormalities of gait and mobility: Secondary | ICD-10-CM | POA: Diagnosis not present

## 2020-06-01 DIAGNOSIS — R279 Unspecified lack of coordination: Secondary | ICD-10-CM | POA: Diagnosis not present

## 2020-06-01 DIAGNOSIS — M6281 Muscle weakness (generalized): Secondary | ICD-10-CM | POA: Diagnosis not present

## 2020-06-01 DIAGNOSIS — R488 Other symbolic dysfunctions: Secondary | ICD-10-CM | POA: Diagnosis not present

## 2020-06-01 DIAGNOSIS — I10 Essential (primary) hypertension: Secondary | ICD-10-CM | POA: Diagnosis not present

## 2020-06-02 DIAGNOSIS — M6281 Muscle weakness (generalized): Secondary | ICD-10-CM | POA: Diagnosis not present

## 2020-06-02 DIAGNOSIS — R2689 Other abnormalities of gait and mobility: Secondary | ICD-10-CM | POA: Diagnosis not present

## 2020-06-02 DIAGNOSIS — R279 Unspecified lack of coordination: Secondary | ICD-10-CM | POA: Diagnosis not present

## 2020-06-02 DIAGNOSIS — T8130XD Disruption of wound, unspecified, subsequent encounter: Secondary | ICD-10-CM | POA: Diagnosis not present

## 2020-06-02 DIAGNOSIS — R488 Other symbolic dysfunctions: Secondary | ICD-10-CM | POA: Diagnosis not present

## 2020-06-03 DIAGNOSIS — R488 Other symbolic dysfunctions: Secondary | ICD-10-CM | POA: Diagnosis not present

## 2020-06-03 DIAGNOSIS — R2689 Other abnormalities of gait and mobility: Secondary | ICD-10-CM | POA: Diagnosis not present

## 2020-06-03 DIAGNOSIS — M6281 Muscle weakness (generalized): Secondary | ICD-10-CM | POA: Diagnosis not present

## 2020-06-03 DIAGNOSIS — T8130XD Disruption of wound, unspecified, subsequent encounter: Secondary | ICD-10-CM | POA: Diagnosis not present

## 2020-06-03 DIAGNOSIS — R279 Unspecified lack of coordination: Secondary | ICD-10-CM | POA: Diagnosis not present

## 2020-06-04 DIAGNOSIS — R488 Other symbolic dysfunctions: Secondary | ICD-10-CM | POA: Diagnosis not present

## 2020-06-04 DIAGNOSIS — M6281 Muscle weakness (generalized): Secondary | ICD-10-CM | POA: Diagnosis not present

## 2020-06-04 DIAGNOSIS — T8130XD Disruption of wound, unspecified, subsequent encounter: Secondary | ICD-10-CM | POA: Diagnosis not present

## 2020-06-04 DIAGNOSIS — R279 Unspecified lack of coordination: Secondary | ICD-10-CM | POA: Diagnosis not present

## 2020-06-04 DIAGNOSIS — R2689 Other abnormalities of gait and mobility: Secondary | ICD-10-CM | POA: Diagnosis not present

## 2020-06-05 DIAGNOSIS — M6281 Muscle weakness (generalized): Secondary | ICD-10-CM | POA: Diagnosis not present

## 2020-06-05 DIAGNOSIS — R2689 Other abnormalities of gait and mobility: Secondary | ICD-10-CM | POA: Diagnosis not present

## 2020-06-05 DIAGNOSIS — R488 Other symbolic dysfunctions: Secondary | ICD-10-CM | POA: Diagnosis not present

## 2020-06-05 DIAGNOSIS — R279 Unspecified lack of coordination: Secondary | ICD-10-CM | POA: Diagnosis not present

## 2020-06-05 DIAGNOSIS — T8130XD Disruption of wound, unspecified, subsequent encounter: Secondary | ICD-10-CM | POA: Diagnosis not present

## 2020-06-08 DIAGNOSIS — R488 Other symbolic dysfunctions: Secondary | ICD-10-CM | POA: Diagnosis not present

## 2020-06-08 DIAGNOSIS — T8130XD Disruption of wound, unspecified, subsequent encounter: Secondary | ICD-10-CM | POA: Diagnosis not present

## 2020-06-08 DIAGNOSIS — M6281 Muscle weakness (generalized): Secondary | ICD-10-CM | POA: Diagnosis not present

## 2020-06-08 DIAGNOSIS — R279 Unspecified lack of coordination: Secondary | ICD-10-CM | POA: Diagnosis not present

## 2020-06-08 DIAGNOSIS — R2689 Other abnormalities of gait and mobility: Secondary | ICD-10-CM | POA: Diagnosis not present

## 2020-06-09 DIAGNOSIS — M6281 Muscle weakness (generalized): Secondary | ICD-10-CM | POA: Diagnosis not present

## 2020-06-09 DIAGNOSIS — M79641 Pain in right hand: Secondary | ICD-10-CM | POA: Diagnosis not present

## 2020-06-09 DIAGNOSIS — R2689 Other abnormalities of gait and mobility: Secondary | ICD-10-CM | POA: Diagnosis not present

## 2020-06-09 DIAGNOSIS — R279 Unspecified lack of coordination: Secondary | ICD-10-CM | POA: Diagnosis not present

## 2020-06-09 DIAGNOSIS — M25531 Pain in right wrist: Secondary | ICD-10-CM | POA: Diagnosis not present

## 2020-06-09 DIAGNOSIS — R488 Other symbolic dysfunctions: Secondary | ICD-10-CM | POA: Diagnosis not present

## 2020-06-09 DIAGNOSIS — T8130XD Disruption of wound, unspecified, subsequent encounter: Secondary | ICD-10-CM | POA: Diagnosis not present

## 2020-06-10 DIAGNOSIS — T8130XD Disruption of wound, unspecified, subsequent encounter: Secondary | ICD-10-CM | POA: Diagnosis not present

## 2020-06-10 DIAGNOSIS — R2689 Other abnormalities of gait and mobility: Secondary | ICD-10-CM | POA: Diagnosis not present

## 2020-06-10 DIAGNOSIS — R5383 Other fatigue: Secondary | ICD-10-CM | POA: Diagnosis not present

## 2020-06-10 DIAGNOSIS — M6281 Muscle weakness (generalized): Secondary | ICD-10-CM | POA: Diagnosis not present

## 2020-06-10 DIAGNOSIS — R488 Other symbolic dysfunctions: Secondary | ICD-10-CM | POA: Diagnosis not present

## 2020-06-10 DIAGNOSIS — I10 Essential (primary) hypertension: Secondary | ICD-10-CM | POA: Diagnosis not present

## 2020-06-10 DIAGNOSIS — S638X1A Sprain of other part of right wrist and hand, initial encounter: Secondary | ICD-10-CM | POA: Diagnosis not present

## 2020-06-10 DIAGNOSIS — M1000 Idiopathic gout, unspecified site: Secondary | ICD-10-CM | POA: Diagnosis not present

## 2020-06-10 DIAGNOSIS — R279 Unspecified lack of coordination: Secondary | ICD-10-CM | POA: Diagnosis not present

## 2020-06-11 DIAGNOSIS — T8130XD Disruption of wound, unspecified, subsequent encounter: Secondary | ICD-10-CM | POA: Diagnosis not present

## 2020-06-11 DIAGNOSIS — R2689 Other abnormalities of gait and mobility: Secondary | ICD-10-CM | POA: Diagnosis not present

## 2020-06-11 DIAGNOSIS — R488 Other symbolic dysfunctions: Secondary | ICD-10-CM | POA: Diagnosis not present

## 2020-06-11 DIAGNOSIS — M6281 Muscle weakness (generalized): Secondary | ICD-10-CM | POA: Diagnosis not present

## 2020-06-11 DIAGNOSIS — R279 Unspecified lack of coordination: Secondary | ICD-10-CM | POA: Diagnosis not present

## 2020-06-12 DIAGNOSIS — M79641 Pain in right hand: Secondary | ICD-10-CM | POA: Diagnosis not present

## 2020-06-12 DIAGNOSIS — M6281 Muscle weakness (generalized): Secondary | ICD-10-CM | POA: Diagnosis not present

## 2020-06-12 DIAGNOSIS — R279 Unspecified lack of coordination: Secondary | ICD-10-CM | POA: Diagnosis not present

## 2020-06-12 DIAGNOSIS — J9601 Acute respiratory failure with hypoxia: Secondary | ICD-10-CM | POA: Diagnosis not present

## 2020-06-12 DIAGNOSIS — T8130XD Disruption of wound, unspecified, subsequent encounter: Secondary | ICD-10-CM | POA: Diagnosis not present

## 2020-06-12 DIAGNOSIS — R2689 Other abnormalities of gait and mobility: Secondary | ICD-10-CM | POA: Diagnosis not present

## 2020-06-12 DIAGNOSIS — R488 Other symbolic dysfunctions: Secondary | ICD-10-CM | POA: Diagnosis not present

## 2020-06-12 DIAGNOSIS — I5031 Acute diastolic (congestive) heart failure: Secondary | ICD-10-CM | POA: Diagnosis not present

## 2020-06-12 DIAGNOSIS — R5381 Other malaise: Secondary | ICD-10-CM | POA: Diagnosis not present

## 2020-06-12 DIAGNOSIS — I129 Hypertensive chronic kidney disease with stage 1 through stage 4 chronic kidney disease, or unspecified chronic kidney disease: Secondary | ICD-10-CM | POA: Diagnosis not present

## 2020-06-12 DIAGNOSIS — M1000 Idiopathic gout, unspecified site: Secondary | ICD-10-CM | POA: Diagnosis not present

## 2020-06-13 DIAGNOSIS — T8130XD Disruption of wound, unspecified, subsequent encounter: Secondary | ICD-10-CM | POA: Diagnosis not present

## 2020-06-13 DIAGNOSIS — R488 Other symbolic dysfunctions: Secondary | ICD-10-CM | POA: Diagnosis not present

## 2020-06-13 DIAGNOSIS — R2689 Other abnormalities of gait and mobility: Secondary | ICD-10-CM | POA: Diagnosis not present

## 2020-06-13 DIAGNOSIS — R279 Unspecified lack of coordination: Secondary | ICD-10-CM | POA: Diagnosis not present

## 2020-06-13 DIAGNOSIS — M6281 Muscle weakness (generalized): Secondary | ICD-10-CM | POA: Diagnosis not present

## 2020-06-15 ENCOUNTER — Other Ambulatory Visit: Payer: Self-pay | Admitting: Student

## 2020-06-15 DIAGNOSIS — S66901A Unspecified injury of unspecified muscle, fascia and tendon at wrist and hand level, right hand, initial encounter: Secondary | ICD-10-CM

## 2020-06-15 DIAGNOSIS — M6281 Muscle weakness (generalized): Secondary | ICD-10-CM | POA: Diagnosis not present

## 2020-06-15 DIAGNOSIS — R488 Other symbolic dysfunctions: Secondary | ICD-10-CM | POA: Diagnosis not present

## 2020-06-15 DIAGNOSIS — I129 Hypertensive chronic kidney disease with stage 1 through stage 4 chronic kidney disease, or unspecified chronic kidney disease: Secondary | ICD-10-CM | POA: Diagnosis not present

## 2020-06-15 DIAGNOSIS — T8130XD Disruption of wound, unspecified, subsequent encounter: Secondary | ICD-10-CM | POA: Diagnosis not present

## 2020-06-15 DIAGNOSIS — R279 Unspecified lack of coordination: Secondary | ICD-10-CM | POA: Diagnosis not present

## 2020-06-15 DIAGNOSIS — R2689 Other abnormalities of gait and mobility: Secondary | ICD-10-CM | POA: Diagnosis not present

## 2020-06-16 DIAGNOSIS — T8130XD Disruption of wound, unspecified, subsequent encounter: Secondary | ICD-10-CM | POA: Diagnosis not present

## 2020-06-16 DIAGNOSIS — R279 Unspecified lack of coordination: Secondary | ICD-10-CM | POA: Diagnosis not present

## 2020-06-16 DIAGNOSIS — S32029D Unspecified fracture of second lumbar vertebra, subsequent encounter for fracture with routine healing: Secondary | ICD-10-CM | POA: Diagnosis not present

## 2020-06-16 DIAGNOSIS — M6281 Muscle weakness (generalized): Secondary | ICD-10-CM | POA: Diagnosis not present

## 2020-06-16 DIAGNOSIS — G9600 Cerebrospinal fluid leak, unspecified: Secondary | ICD-10-CM | POA: Diagnosis not present

## 2020-06-16 DIAGNOSIS — S32049D Unspecified fracture of fourth lumbar vertebra, subsequent encounter for fracture with routine healing: Secondary | ICD-10-CM | POA: Diagnosis not present

## 2020-06-16 DIAGNOSIS — R2689 Other abnormalities of gait and mobility: Secondary | ICD-10-CM | POA: Diagnosis not present

## 2020-06-16 DIAGNOSIS — L89629 Pressure ulcer of left heel, unspecified stage: Secondary | ICD-10-CM | POA: Diagnosis not present

## 2020-06-16 DIAGNOSIS — R488 Other symbolic dysfunctions: Secondary | ICD-10-CM | POA: Diagnosis not present

## 2020-06-16 DIAGNOSIS — Z4789 Encounter for other orthopedic aftercare: Secondary | ICD-10-CM | POA: Diagnosis not present

## 2020-06-16 DIAGNOSIS — S32039D Unspecified fracture of third lumbar vertebra, subsequent encounter for fracture with routine healing: Secondary | ICD-10-CM | POA: Diagnosis not present

## 2020-06-16 DIAGNOSIS — M5459 Other low back pain: Secondary | ICD-10-CM | POA: Diagnosis not present

## 2020-06-16 DIAGNOSIS — Z981 Arthrodesis status: Secondary | ICD-10-CM | POA: Diagnosis not present

## 2020-06-17 DIAGNOSIS — R2689 Other abnormalities of gait and mobility: Secondary | ICD-10-CM | POA: Diagnosis not present

## 2020-06-17 DIAGNOSIS — T8130XD Disruption of wound, unspecified, subsequent encounter: Secondary | ICD-10-CM | POA: Diagnosis not present

## 2020-06-17 DIAGNOSIS — R488 Other symbolic dysfunctions: Secondary | ICD-10-CM | POA: Diagnosis not present

## 2020-06-17 DIAGNOSIS — I10 Essential (primary) hypertension: Secondary | ICD-10-CM | POA: Diagnosis not present

## 2020-06-17 DIAGNOSIS — R279 Unspecified lack of coordination: Secondary | ICD-10-CM | POA: Diagnosis not present

## 2020-06-17 DIAGNOSIS — M6281 Muscle weakness (generalized): Secondary | ICD-10-CM | POA: Diagnosis not present

## 2020-06-18 DIAGNOSIS — R488 Other symbolic dysfunctions: Secondary | ICD-10-CM | POA: Diagnosis not present

## 2020-06-18 DIAGNOSIS — T8130XD Disruption of wound, unspecified, subsequent encounter: Secondary | ICD-10-CM | POA: Diagnosis not present

## 2020-06-18 DIAGNOSIS — M6281 Muscle weakness (generalized): Secondary | ICD-10-CM | POA: Diagnosis not present

## 2020-06-18 DIAGNOSIS — R2689 Other abnormalities of gait and mobility: Secondary | ICD-10-CM | POA: Diagnosis not present

## 2020-06-18 DIAGNOSIS — R279 Unspecified lack of coordination: Secondary | ICD-10-CM | POA: Diagnosis not present

## 2020-06-18 DIAGNOSIS — L89620 Pressure ulcer of left heel, unstageable: Secondary | ICD-10-CM | POA: Diagnosis not present

## 2020-06-19 DIAGNOSIS — R488 Other symbolic dysfunctions: Secondary | ICD-10-CM | POA: Diagnosis not present

## 2020-06-19 DIAGNOSIS — M6281 Muscle weakness (generalized): Secondary | ICD-10-CM | POA: Diagnosis not present

## 2020-06-19 DIAGNOSIS — R279 Unspecified lack of coordination: Secondary | ICD-10-CM | POA: Diagnosis not present

## 2020-06-19 DIAGNOSIS — R2689 Other abnormalities of gait and mobility: Secondary | ICD-10-CM | POA: Diagnosis not present

## 2020-06-19 DIAGNOSIS — T8130XD Disruption of wound, unspecified, subsequent encounter: Secondary | ICD-10-CM | POA: Diagnosis not present

## 2020-06-20 DIAGNOSIS — R279 Unspecified lack of coordination: Secondary | ICD-10-CM | POA: Diagnosis not present

## 2020-06-20 DIAGNOSIS — T8130XD Disruption of wound, unspecified, subsequent encounter: Secondary | ICD-10-CM | POA: Diagnosis not present

## 2020-06-20 DIAGNOSIS — R488 Other symbolic dysfunctions: Secondary | ICD-10-CM | POA: Diagnosis not present

## 2020-06-20 DIAGNOSIS — R2689 Other abnormalities of gait and mobility: Secondary | ICD-10-CM | POA: Diagnosis not present

## 2020-06-20 DIAGNOSIS — M6281 Muscle weakness (generalized): Secondary | ICD-10-CM | POA: Diagnosis not present

## 2020-06-22 DIAGNOSIS — M6281 Muscle weakness (generalized): Secondary | ICD-10-CM | POA: Diagnosis not present

## 2020-06-22 DIAGNOSIS — R488 Other symbolic dysfunctions: Secondary | ICD-10-CM | POA: Diagnosis not present

## 2020-06-22 DIAGNOSIS — T8130XD Disruption of wound, unspecified, subsequent encounter: Secondary | ICD-10-CM | POA: Diagnosis not present

## 2020-06-22 DIAGNOSIS — R279 Unspecified lack of coordination: Secondary | ICD-10-CM | POA: Diagnosis not present

## 2020-06-22 DIAGNOSIS — R2689 Other abnormalities of gait and mobility: Secondary | ICD-10-CM | POA: Diagnosis not present

## 2020-06-23 DIAGNOSIS — K521 Toxic gastroenteritis and colitis: Secondary | ICD-10-CM | POA: Diagnosis not present

## 2020-06-23 DIAGNOSIS — R279 Unspecified lack of coordination: Secondary | ICD-10-CM | POA: Diagnosis not present

## 2020-06-23 DIAGNOSIS — B370 Candidal stomatitis: Secondary | ICD-10-CM | POA: Diagnosis not present

## 2020-06-23 DIAGNOSIS — M6281 Muscle weakness (generalized): Secondary | ICD-10-CM | POA: Diagnosis not present

## 2020-06-23 DIAGNOSIS — R488 Other symbolic dysfunctions: Secondary | ICD-10-CM | POA: Diagnosis not present

## 2020-06-23 DIAGNOSIS — K1120 Sialoadenitis, unspecified: Secondary | ICD-10-CM | POA: Diagnosis not present

## 2020-06-23 DIAGNOSIS — R2689 Other abnormalities of gait and mobility: Secondary | ICD-10-CM | POA: Diagnosis not present

## 2020-06-23 DIAGNOSIS — T8130XD Disruption of wound, unspecified, subsequent encounter: Secondary | ICD-10-CM | POA: Diagnosis not present

## 2020-06-24 DIAGNOSIS — R279 Unspecified lack of coordination: Secondary | ICD-10-CM | POA: Diagnosis not present

## 2020-06-24 DIAGNOSIS — R2689 Other abnormalities of gait and mobility: Secondary | ICD-10-CM | POA: Diagnosis not present

## 2020-06-24 DIAGNOSIS — R488 Other symbolic dysfunctions: Secondary | ICD-10-CM | POA: Diagnosis not present

## 2020-06-24 DIAGNOSIS — M6281 Muscle weakness (generalized): Secondary | ICD-10-CM | POA: Diagnosis not present

## 2020-06-24 DIAGNOSIS — T8130XD Disruption of wound, unspecified, subsequent encounter: Secondary | ICD-10-CM | POA: Diagnosis not present

## 2020-06-25 DIAGNOSIS — R488 Other symbolic dysfunctions: Secondary | ICD-10-CM | POA: Diagnosis not present

## 2020-06-25 DIAGNOSIS — T8130XD Disruption of wound, unspecified, subsequent encounter: Secondary | ICD-10-CM | POA: Diagnosis not present

## 2020-06-25 DIAGNOSIS — L89620 Pressure ulcer of left heel, unstageable: Secondary | ICD-10-CM | POA: Diagnosis not present

## 2020-06-25 DIAGNOSIS — M6281 Muscle weakness (generalized): Secondary | ICD-10-CM | POA: Diagnosis not present

## 2020-06-25 DIAGNOSIS — R279 Unspecified lack of coordination: Secondary | ICD-10-CM | POA: Diagnosis not present

## 2020-06-25 DIAGNOSIS — R2689 Other abnormalities of gait and mobility: Secondary | ICD-10-CM | POA: Diagnosis not present

## 2020-06-26 DIAGNOSIS — R2689 Other abnormalities of gait and mobility: Secondary | ICD-10-CM | POA: Diagnosis not present

## 2020-06-26 DIAGNOSIS — T8130XD Disruption of wound, unspecified, subsequent encounter: Secondary | ICD-10-CM | POA: Diagnosis not present

## 2020-06-26 DIAGNOSIS — M6281 Muscle weakness (generalized): Secondary | ICD-10-CM | POA: Diagnosis not present

## 2020-06-26 DIAGNOSIS — R279 Unspecified lack of coordination: Secondary | ICD-10-CM | POA: Diagnosis not present

## 2020-06-26 DIAGNOSIS — R488 Other symbolic dysfunctions: Secondary | ICD-10-CM | POA: Diagnosis not present

## 2020-06-27 DIAGNOSIS — R279 Unspecified lack of coordination: Secondary | ICD-10-CM | POA: Diagnosis not present

## 2020-06-27 DIAGNOSIS — R2689 Other abnormalities of gait and mobility: Secondary | ICD-10-CM | POA: Diagnosis not present

## 2020-06-27 DIAGNOSIS — R488 Other symbolic dysfunctions: Secondary | ICD-10-CM | POA: Diagnosis not present

## 2020-06-27 DIAGNOSIS — T8130XD Disruption of wound, unspecified, subsequent encounter: Secondary | ICD-10-CM | POA: Diagnosis not present

## 2020-06-27 DIAGNOSIS — M6281 Muscle weakness (generalized): Secondary | ICD-10-CM | POA: Diagnosis not present

## 2020-06-30 DIAGNOSIS — M6281 Muscle weakness (generalized): Secondary | ICD-10-CM | POA: Diagnosis not present

## 2020-06-30 DIAGNOSIS — R488 Other symbolic dysfunctions: Secondary | ICD-10-CM | POA: Diagnosis not present

## 2020-06-30 DIAGNOSIS — T8130XD Disruption of wound, unspecified, subsequent encounter: Secondary | ICD-10-CM | POA: Diagnosis not present

## 2020-06-30 DIAGNOSIS — R279 Unspecified lack of coordination: Secondary | ICD-10-CM | POA: Diagnosis not present

## 2020-06-30 DIAGNOSIS — R2689 Other abnormalities of gait and mobility: Secondary | ICD-10-CM | POA: Diagnosis not present

## 2020-07-01 DIAGNOSIS — T8130XD Disruption of wound, unspecified, subsequent encounter: Secondary | ICD-10-CM | POA: Diagnosis not present

## 2020-07-01 DIAGNOSIS — M6281 Muscle weakness (generalized): Secondary | ICD-10-CM | POA: Diagnosis not present

## 2020-07-01 DIAGNOSIS — R488 Other symbolic dysfunctions: Secondary | ICD-10-CM | POA: Diagnosis not present

## 2020-07-01 DIAGNOSIS — R279 Unspecified lack of coordination: Secondary | ICD-10-CM | POA: Diagnosis not present

## 2020-07-01 DIAGNOSIS — R2689 Other abnormalities of gait and mobility: Secondary | ICD-10-CM | POA: Diagnosis not present

## 2020-07-02 DIAGNOSIS — I5030 Unspecified diastolic (congestive) heart failure: Secondary | ICD-10-CM | POA: Diagnosis not present

## 2020-07-02 DIAGNOSIS — R5381 Other malaise: Secondary | ICD-10-CM | POA: Diagnosis not present

## 2020-07-02 DIAGNOSIS — J9601 Acute respiratory failure with hypoxia: Secondary | ICD-10-CM | POA: Diagnosis not present

## 2020-07-02 DIAGNOSIS — L89620 Pressure ulcer of left heel, unstageable: Secondary | ICD-10-CM | POA: Diagnosis not present

## 2020-07-02 DIAGNOSIS — R279 Unspecified lack of coordination: Secondary | ICD-10-CM | POA: Diagnosis not present

## 2020-07-02 DIAGNOSIS — R2689 Other abnormalities of gait and mobility: Secondary | ICD-10-CM | POA: Diagnosis not present

## 2020-07-02 DIAGNOSIS — T8130XD Disruption of wound, unspecified, subsequent encounter: Secondary | ICD-10-CM | POA: Diagnosis not present

## 2020-07-02 DIAGNOSIS — M6281 Muscle weakness (generalized): Secondary | ICD-10-CM | POA: Diagnosis not present

## 2020-07-02 DIAGNOSIS — M726 Necrotizing fasciitis: Secondary | ICD-10-CM | POA: Diagnosis not present

## 2020-07-02 DIAGNOSIS — R488 Other symbolic dysfunctions: Secondary | ICD-10-CM | POA: Diagnosis not present

## 2020-07-03 DIAGNOSIS — M6281 Muscle weakness (generalized): Secondary | ICD-10-CM | POA: Diagnosis not present

## 2020-07-03 DIAGNOSIS — T8130XD Disruption of wound, unspecified, subsequent encounter: Secondary | ICD-10-CM | POA: Diagnosis not present

## 2020-07-03 DIAGNOSIS — R279 Unspecified lack of coordination: Secondary | ICD-10-CM | POA: Diagnosis not present

## 2020-07-03 DIAGNOSIS — R488 Other symbolic dysfunctions: Secondary | ICD-10-CM | POA: Diagnosis not present

## 2020-07-03 DIAGNOSIS — R2689 Other abnormalities of gait and mobility: Secondary | ICD-10-CM | POA: Diagnosis not present

## 2020-07-06 DIAGNOSIS — R279 Unspecified lack of coordination: Secondary | ICD-10-CM | POA: Diagnosis not present

## 2020-07-06 DIAGNOSIS — R2689 Other abnormalities of gait and mobility: Secondary | ICD-10-CM | POA: Diagnosis not present

## 2020-07-06 DIAGNOSIS — R488 Other symbolic dysfunctions: Secondary | ICD-10-CM | POA: Diagnosis not present

## 2020-07-06 DIAGNOSIS — M6281 Muscle weakness (generalized): Secondary | ICD-10-CM | POA: Diagnosis not present

## 2020-07-06 DIAGNOSIS — T8130XD Disruption of wound, unspecified, subsequent encounter: Secondary | ICD-10-CM | POA: Diagnosis not present

## 2020-07-07 DIAGNOSIS — R279 Unspecified lack of coordination: Secondary | ICD-10-CM | POA: Diagnosis not present

## 2020-07-07 DIAGNOSIS — T8130XD Disruption of wound, unspecified, subsequent encounter: Secondary | ICD-10-CM | POA: Diagnosis not present

## 2020-07-07 DIAGNOSIS — M6281 Muscle weakness (generalized): Secondary | ICD-10-CM | POA: Diagnosis not present

## 2020-07-07 DIAGNOSIS — R488 Other symbolic dysfunctions: Secondary | ICD-10-CM | POA: Diagnosis not present

## 2020-07-07 DIAGNOSIS — R2689 Other abnormalities of gait and mobility: Secondary | ICD-10-CM | POA: Diagnosis not present

## 2020-07-08 DIAGNOSIS — R2689 Other abnormalities of gait and mobility: Secondary | ICD-10-CM | POA: Diagnosis not present

## 2020-07-08 DIAGNOSIS — T8130XD Disruption of wound, unspecified, subsequent encounter: Secondary | ICD-10-CM | POA: Diagnosis not present

## 2020-07-08 DIAGNOSIS — R279 Unspecified lack of coordination: Secondary | ICD-10-CM | POA: Diagnosis not present

## 2020-07-08 DIAGNOSIS — M6281 Muscle weakness (generalized): Secondary | ICD-10-CM | POA: Diagnosis not present

## 2020-07-08 DIAGNOSIS — R488 Other symbolic dysfunctions: Secondary | ICD-10-CM | POA: Diagnosis not present

## 2020-07-09 DIAGNOSIS — L89620 Pressure ulcer of left heel, unstageable: Secondary | ICD-10-CM | POA: Diagnosis not present

## 2020-07-09 DIAGNOSIS — R279 Unspecified lack of coordination: Secondary | ICD-10-CM | POA: Diagnosis not present

## 2020-07-09 DIAGNOSIS — L89153 Pressure ulcer of sacral region, stage 3: Secondary | ICD-10-CM | POA: Diagnosis not present

## 2020-07-09 DIAGNOSIS — T8130XD Disruption of wound, unspecified, subsequent encounter: Secondary | ICD-10-CM | POA: Diagnosis not present

## 2020-07-09 DIAGNOSIS — R488 Other symbolic dysfunctions: Secondary | ICD-10-CM | POA: Diagnosis not present

## 2020-07-09 DIAGNOSIS — M6281 Muscle weakness (generalized): Secondary | ICD-10-CM | POA: Diagnosis not present

## 2020-07-09 DIAGNOSIS — R2689 Other abnormalities of gait and mobility: Secondary | ICD-10-CM | POA: Diagnosis not present

## 2020-07-10 DIAGNOSIS — M6281 Muscle weakness (generalized): Secondary | ICD-10-CM | POA: Diagnosis not present

## 2020-07-10 DIAGNOSIS — R279 Unspecified lack of coordination: Secondary | ICD-10-CM | POA: Diagnosis not present

## 2020-07-10 DIAGNOSIS — T8130XD Disruption of wound, unspecified, subsequent encounter: Secondary | ICD-10-CM | POA: Diagnosis not present

## 2020-07-10 DIAGNOSIS — R2689 Other abnormalities of gait and mobility: Secondary | ICD-10-CM | POA: Diagnosis not present

## 2020-07-10 DIAGNOSIS — R488 Other symbolic dysfunctions: Secondary | ICD-10-CM | POA: Diagnosis not present

## 2020-07-11 DIAGNOSIS — R488 Other symbolic dysfunctions: Secondary | ICD-10-CM | POA: Diagnosis not present

## 2020-07-11 DIAGNOSIS — T8130XD Disruption of wound, unspecified, subsequent encounter: Secondary | ICD-10-CM | POA: Diagnosis not present

## 2020-07-11 DIAGNOSIS — M6281 Muscle weakness (generalized): Secondary | ICD-10-CM | POA: Diagnosis not present

## 2020-07-11 DIAGNOSIS — R279 Unspecified lack of coordination: Secondary | ICD-10-CM | POA: Diagnosis not present

## 2020-07-11 DIAGNOSIS — R2689 Other abnormalities of gait and mobility: Secondary | ICD-10-CM | POA: Diagnosis not present

## 2020-07-12 DIAGNOSIS — R279 Unspecified lack of coordination: Secondary | ICD-10-CM | POA: Diagnosis not present

## 2020-07-12 DIAGNOSIS — R488 Other symbolic dysfunctions: Secondary | ICD-10-CM | POA: Diagnosis not present

## 2020-07-12 DIAGNOSIS — M6281 Muscle weakness (generalized): Secondary | ICD-10-CM | POA: Diagnosis not present

## 2020-07-12 DIAGNOSIS — R2689 Other abnormalities of gait and mobility: Secondary | ICD-10-CM | POA: Diagnosis not present

## 2020-07-12 DIAGNOSIS — T8130XD Disruption of wound, unspecified, subsequent encounter: Secondary | ICD-10-CM | POA: Diagnosis not present

## 2020-07-13 DIAGNOSIS — T8130XD Disruption of wound, unspecified, subsequent encounter: Secondary | ICD-10-CM | POA: Diagnosis not present

## 2020-07-13 DIAGNOSIS — M6281 Muscle weakness (generalized): Secondary | ICD-10-CM | POA: Diagnosis not present

## 2020-07-13 DIAGNOSIS — R279 Unspecified lack of coordination: Secondary | ICD-10-CM | POA: Diagnosis not present

## 2020-07-13 DIAGNOSIS — R488 Other symbolic dysfunctions: Secondary | ICD-10-CM | POA: Diagnosis not present

## 2020-07-13 DIAGNOSIS — R2689 Other abnormalities of gait and mobility: Secondary | ICD-10-CM | POA: Diagnosis not present

## 2020-07-14 DIAGNOSIS — S31000A Unspecified open wound of lower back and pelvis without penetration into retroperitoneum, initial encounter: Secondary | ICD-10-CM | POA: Diagnosis not present

## 2020-07-14 DIAGNOSIS — S91309A Unspecified open wound, unspecified foot, initial encounter: Secondary | ICD-10-CM | POA: Diagnosis not present

## 2020-07-14 DIAGNOSIS — R5381 Other malaise: Secondary | ICD-10-CM | POA: Diagnosis not present

## 2020-07-14 DIAGNOSIS — R279 Unspecified lack of coordination: Secondary | ICD-10-CM | POA: Diagnosis not present

## 2020-07-14 DIAGNOSIS — M6281 Muscle weakness (generalized): Secondary | ICD-10-CM | POA: Diagnosis not present

## 2020-07-14 DIAGNOSIS — T8130XD Disruption of wound, unspecified, subsequent encounter: Secondary | ICD-10-CM | POA: Diagnosis not present

## 2020-07-14 DIAGNOSIS — R2689 Other abnormalities of gait and mobility: Secondary | ICD-10-CM | POA: Diagnosis not present

## 2020-07-14 DIAGNOSIS — R488 Other symbolic dysfunctions: Secondary | ICD-10-CM | POA: Diagnosis not present

## 2020-07-15 DIAGNOSIS — R279 Unspecified lack of coordination: Secondary | ICD-10-CM | POA: Diagnosis not present

## 2020-07-15 DIAGNOSIS — M6281 Muscle weakness (generalized): Secondary | ICD-10-CM | POA: Diagnosis not present

## 2020-07-15 DIAGNOSIS — R488 Other symbolic dysfunctions: Secondary | ICD-10-CM | POA: Diagnosis not present

## 2020-07-15 DIAGNOSIS — R2689 Other abnormalities of gait and mobility: Secondary | ICD-10-CM | POA: Diagnosis not present

## 2020-07-15 DIAGNOSIS — T8130XD Disruption of wound, unspecified, subsequent encounter: Secondary | ICD-10-CM | POA: Diagnosis not present

## 2020-07-16 DIAGNOSIS — R488 Other symbolic dysfunctions: Secondary | ICD-10-CM | POA: Diagnosis not present

## 2020-07-16 DIAGNOSIS — R2689 Other abnormalities of gait and mobility: Secondary | ICD-10-CM | POA: Diagnosis not present

## 2020-07-16 DIAGNOSIS — M6281 Muscle weakness (generalized): Secondary | ICD-10-CM | POA: Diagnosis not present

## 2020-07-16 DIAGNOSIS — R52 Pain, unspecified: Secondary | ICD-10-CM | POA: Diagnosis not present

## 2020-07-16 DIAGNOSIS — R279 Unspecified lack of coordination: Secondary | ICD-10-CM | POA: Diagnosis not present

## 2020-07-16 DIAGNOSIS — L89150 Pressure ulcer of sacral region, unstageable: Secondary | ICD-10-CM | POA: Diagnosis not present

## 2020-07-16 DIAGNOSIS — T8189XD Other complications of procedures, not elsewhere classified, subsequent encounter: Secondary | ICD-10-CM | POA: Diagnosis not present

## 2020-07-16 DIAGNOSIS — T8130XD Disruption of wound, unspecified, subsequent encounter: Secondary | ICD-10-CM | POA: Diagnosis not present

## 2020-07-16 DIAGNOSIS — L89622 Pressure ulcer of left heel, stage 2: Secondary | ICD-10-CM | POA: Diagnosis not present

## 2020-07-16 DIAGNOSIS — S31000D Unspecified open wound of lower back and pelvis without penetration into retroperitoneum, subsequent encounter: Secondary | ICD-10-CM | POA: Diagnosis not present

## 2020-07-16 DIAGNOSIS — R5381 Other malaise: Secondary | ICD-10-CM | POA: Diagnosis not present

## 2020-07-17 DIAGNOSIS — R279 Unspecified lack of coordination: Secondary | ICD-10-CM | POA: Diagnosis not present

## 2020-07-17 DIAGNOSIS — R2689 Other abnormalities of gait and mobility: Secondary | ICD-10-CM | POA: Diagnosis not present

## 2020-07-17 DIAGNOSIS — R488 Other symbolic dysfunctions: Secondary | ICD-10-CM | POA: Diagnosis not present

## 2020-07-17 DIAGNOSIS — M6281 Muscle weakness (generalized): Secondary | ICD-10-CM | POA: Diagnosis not present

## 2020-07-17 DIAGNOSIS — T8130XD Disruption of wound, unspecified, subsequent encounter: Secondary | ICD-10-CM | POA: Diagnosis not present

## 2020-07-18 ENCOUNTER — Emergency Department: Payer: Medicare Other

## 2020-07-18 ENCOUNTER — Encounter: Payer: Self-pay | Admitting: Internal Medicine

## 2020-07-18 ENCOUNTER — Other Ambulatory Visit: Payer: Self-pay

## 2020-07-18 ENCOUNTER — Inpatient Hospital Stay: Payer: Medicare Other

## 2020-07-18 ENCOUNTER — Inpatient Hospital Stay
Admission: EM | Admit: 2020-07-18 | Discharge: 2020-08-10 | DRG: 628 | Disposition: A | Discharge status: Skilled Nursing Facility | Payer: Medicare Other | Attending: Internal Medicine | Admitting: Internal Medicine

## 2020-07-18 DIAGNOSIS — I130 Hypertensive heart and chronic kidney disease with heart failure and stage 1 through stage 4 chronic kidney disease, or unspecified chronic kidney disease: Secondary | ICD-10-CM | POA: Diagnosis not present

## 2020-07-18 DIAGNOSIS — M6281 Muscle weakness (generalized): Secondary | ICD-10-CM | POA: Diagnosis not present

## 2020-07-18 DIAGNOSIS — I615 Nontraumatic intracerebral hemorrhage, intraventricular: Secondary | ICD-10-CM | POA: Diagnosis not present

## 2020-07-18 DIAGNOSIS — N179 Acute kidney failure, unspecified: Secondary | ICD-10-CM | POA: Diagnosis present

## 2020-07-18 DIAGNOSIS — L89154 Pressure ulcer of sacral region, stage 4: Secondary | ICD-10-CM | POA: Diagnosis not present

## 2020-07-18 DIAGNOSIS — Z8619 Personal history of other infectious and parasitic diseases: Secondary | ICD-10-CM | POA: Diagnosis not present

## 2020-07-18 DIAGNOSIS — J449 Chronic obstructive pulmonary disease, unspecified: Secondary | ICD-10-CM | POA: Diagnosis present

## 2020-07-18 DIAGNOSIS — R4182 Altered mental status, unspecified: Secondary | ICD-10-CM | POA: Diagnosis not present

## 2020-07-18 DIAGNOSIS — M869 Osteomyelitis, unspecified: Secondary | ICD-10-CM | POA: Diagnosis present

## 2020-07-18 DIAGNOSIS — I959 Hypotension, unspecified: Secondary | ICD-10-CM | POA: Diagnosis present

## 2020-07-18 DIAGNOSIS — Z6835 Body mass index (BMI) 35.0-35.9, adult: Secondary | ICD-10-CM

## 2020-07-18 DIAGNOSIS — Z8042 Family history of malignant neoplasm of prostate: Secondary | ICD-10-CM

## 2020-07-18 DIAGNOSIS — F05 Delirium due to known physiological condition: Secondary | ICD-10-CM | POA: Diagnosis not present

## 2020-07-18 DIAGNOSIS — Z882 Allergy status to sulfonamides status: Secondary | ICD-10-CM

## 2020-07-18 DIAGNOSIS — E119 Type 2 diabetes mellitus without complications: Secondary | ICD-10-CM | POA: Diagnosis not present

## 2020-07-18 DIAGNOSIS — L89159 Pressure ulcer of sacral region, unspecified stage: Secondary | ICD-10-CM | POA: Diagnosis not present

## 2020-07-18 DIAGNOSIS — A419 Sepsis, unspecified organism: Secondary | ICD-10-CM | POA: Diagnosis not present

## 2020-07-18 DIAGNOSIS — L89620 Pressure ulcer of left heel, unstageable: Secondary | ICD-10-CM | POA: Diagnosis present

## 2020-07-18 DIAGNOSIS — N1831 Chronic kidney disease, stage 3a: Secondary | ICD-10-CM | POA: Diagnosis not present

## 2020-07-18 DIAGNOSIS — N183 Chronic kidney disease, stage 3 unspecified: Secondary | ICD-10-CM | POA: Diagnosis not present

## 2020-07-18 DIAGNOSIS — M8580 Other specified disorders of bone density and structure, unspecified site: Secondary | ICD-10-CM | POA: Diagnosis present

## 2020-07-18 DIAGNOSIS — E875 Hyperkalemia: Secondary | ICD-10-CM | POA: Diagnosis present

## 2020-07-18 DIAGNOSIS — I10 Essential (primary) hypertension: Secondary | ICD-10-CM

## 2020-07-18 DIAGNOSIS — Z8781 Personal history of (healed) traumatic fracture: Secondary | ICD-10-CM | POA: Diagnosis not present

## 2020-07-18 DIAGNOSIS — Z1621 Resistance to vancomycin: Secondary | ICD-10-CM | POA: Diagnosis not present

## 2020-07-18 DIAGNOSIS — C50411 Malignant neoplasm of upper-outer quadrant of right female breast: Secondary | ICD-10-CM | POA: Diagnosis not present

## 2020-07-18 DIAGNOSIS — Z96653 Presence of artificial knee joint, bilateral: Secondary | ICD-10-CM | POA: Diagnosis present

## 2020-07-18 DIAGNOSIS — E860 Dehydration: Secondary | ICD-10-CM | POA: Diagnosis not present

## 2020-07-18 DIAGNOSIS — R0689 Other abnormalities of breathing: Secondary | ICD-10-CM | POA: Diagnosis not present

## 2020-07-18 DIAGNOSIS — L89153 Pressure ulcer of sacral region, stage 3: Secondary | ICD-10-CM | POA: Diagnosis not present

## 2020-07-18 DIAGNOSIS — Z20822 Contact with and (suspected) exposure to covid-19: Secondary | ICD-10-CM | POA: Diagnosis present

## 2020-07-18 DIAGNOSIS — Z8673 Personal history of transient ischemic attack (TIA), and cerebral infarction without residual deficits: Secondary | ICD-10-CM

## 2020-07-18 DIAGNOSIS — R531 Weakness: Secondary | ICD-10-CM | POA: Diagnosis not present

## 2020-07-18 DIAGNOSIS — R279 Unspecified lack of coordination: Secondary | ICD-10-CM | POA: Diagnosis not present

## 2020-07-18 DIAGNOSIS — K219 Gastro-esophageal reflux disease without esophagitis: Secondary | ICD-10-CM | POA: Diagnosis present

## 2020-07-18 DIAGNOSIS — R5381 Other malaise: Secondary | ICD-10-CM | POA: Diagnosis not present

## 2020-07-18 DIAGNOSIS — F0391 Unspecified dementia with behavioral disturbance: Secondary | ICD-10-CM | POA: Diagnosis present

## 2020-07-18 DIAGNOSIS — J9811 Atelectasis: Secondary | ICD-10-CM | POA: Diagnosis not present

## 2020-07-18 DIAGNOSIS — Z853 Personal history of malignant neoplasm of breast: Secondary | ICD-10-CM

## 2020-07-18 DIAGNOSIS — T8130XD Disruption of wound, unspecified, subsequent encounter: Secondary | ICD-10-CM | POA: Diagnosis not present

## 2020-07-18 DIAGNOSIS — G9341 Metabolic encephalopathy: Secondary | ICD-10-CM | POA: Diagnosis not present

## 2020-07-18 DIAGNOSIS — E1122 Type 2 diabetes mellitus with diabetic chronic kidney disease: Secondary | ICD-10-CM | POA: Diagnosis not present

## 2020-07-18 DIAGNOSIS — B9689 Other specified bacterial agents as the cause of diseases classified elsewhere: Secondary | ICD-10-CM | POA: Diagnosis not present

## 2020-07-18 DIAGNOSIS — Z7401 Bed confinement status: Secondary | ICD-10-CM

## 2020-07-18 DIAGNOSIS — Z833 Family history of diabetes mellitus: Secondary | ICD-10-CM

## 2020-07-18 DIAGNOSIS — K922 Gastrointestinal hemorrhage, unspecified: Secondary | ICD-10-CM | POA: Diagnosis not present

## 2020-07-18 DIAGNOSIS — R404 Transient alteration of awareness: Secondary | ICD-10-CM | POA: Diagnosis not present

## 2020-07-18 DIAGNOSIS — Z886 Allergy status to analgesic agent status: Secondary | ICD-10-CM

## 2020-07-18 DIAGNOSIS — M25531 Pain in right wrist: Secondary | ICD-10-CM | POA: Diagnosis not present

## 2020-07-18 DIAGNOSIS — T148XXD Other injury of unspecified body region, subsequent encounter: Secondary | ICD-10-CM | POA: Diagnosis not present

## 2020-07-18 DIAGNOSIS — N390 Urinary tract infection, site not specified: Secondary | ICD-10-CM | POA: Diagnosis not present

## 2020-07-18 DIAGNOSIS — E870 Hyperosmolality and hypernatremia: Secondary | ICD-10-CM | POA: Diagnosis not present

## 2020-07-18 DIAGNOSIS — Z66 Do not resuscitate: Secondary | ICD-10-CM | POA: Diagnosis not present

## 2020-07-18 DIAGNOSIS — Z96641 Presence of right artificial hip joint: Secondary | ICD-10-CM | POA: Diagnosis present

## 2020-07-18 DIAGNOSIS — M109 Gout, unspecified: Secondary | ICD-10-CM | POA: Diagnosis present

## 2020-07-18 DIAGNOSIS — Z170 Estrogen receptor positive status [ER+]: Secondary | ICD-10-CM

## 2020-07-18 DIAGNOSIS — E43 Unspecified severe protein-calorie malnutrition: Secondary | ICD-10-CM | POA: Diagnosis present

## 2020-07-18 DIAGNOSIS — R2689 Other abnormalities of gait and mobility: Secondary | ICD-10-CM | POA: Diagnosis not present

## 2020-07-18 DIAGNOSIS — E785 Hyperlipidemia, unspecified: Secondary | ICD-10-CM | POA: Diagnosis present

## 2020-07-18 DIAGNOSIS — I96 Gangrene, not elsewhere classified: Secondary | ICD-10-CM | POA: Diagnosis not present

## 2020-07-18 DIAGNOSIS — R627 Adult failure to thrive: Secondary | ICD-10-CM | POA: Diagnosis not present

## 2020-07-18 DIAGNOSIS — J189 Pneumonia, unspecified organism: Secondary | ICD-10-CM

## 2020-07-18 DIAGNOSIS — Z923 Personal history of irradiation: Secondary | ICD-10-CM

## 2020-07-18 DIAGNOSIS — B952 Enterococcus as the cause of diseases classified elsewhere: Secondary | ICD-10-CM | POA: Diagnosis not present

## 2020-07-18 DIAGNOSIS — A499 Bacterial infection, unspecified: Secondary | ICD-10-CM | POA: Diagnosis not present

## 2020-07-18 DIAGNOSIS — Z841 Family history of disorders of kidney and ureter: Secondary | ICD-10-CM

## 2020-07-18 DIAGNOSIS — Z7982 Long term (current) use of aspirin: Secondary | ICD-10-CM

## 2020-07-18 DIAGNOSIS — L89132 Pressure ulcer of right lower back, stage 2: Secondary | ICD-10-CM | POA: Diagnosis present

## 2020-07-18 DIAGNOSIS — Z981 Arthrodesis status: Secondary | ICD-10-CM

## 2020-07-18 DIAGNOSIS — L89142 Pressure ulcer of left lower back, stage 2: Secondary | ICD-10-CM | POA: Diagnosis present

## 2020-07-18 DIAGNOSIS — Z86718 Personal history of other venous thrombosis and embolism: Secondary | ICD-10-CM

## 2020-07-18 DIAGNOSIS — K449 Diaphragmatic hernia without obstruction or gangrene: Secondary | ICD-10-CM | POA: Diagnosis not present

## 2020-07-18 DIAGNOSIS — R488 Other symbolic dysfunctions: Secondary | ICD-10-CM | POA: Diagnosis not present

## 2020-07-18 DIAGNOSIS — Z743 Need for continuous supervision: Secondary | ICD-10-CM | POA: Diagnosis not present

## 2020-07-18 DIAGNOSIS — J90 Pleural effusion, not elsewhere classified: Secondary | ICD-10-CM | POA: Diagnosis not present

## 2020-07-18 DIAGNOSIS — M1990 Unspecified osteoarthritis, unspecified site: Secondary | ICD-10-CM | POA: Diagnosis present

## 2020-07-18 DIAGNOSIS — Z79899 Other long term (current) drug therapy: Secondary | ICD-10-CM

## 2020-07-18 DIAGNOSIS — Z888 Allergy status to other drugs, medicaments and biological substances status: Secondary | ICD-10-CM

## 2020-07-18 DIAGNOSIS — L89150 Pressure ulcer of sacral region, unstageable: Secondary | ICD-10-CM | POA: Diagnosis not present

## 2020-07-18 DIAGNOSIS — Z823 Family history of stroke: Secondary | ICD-10-CM

## 2020-07-18 DIAGNOSIS — Z8249 Family history of ischemic heart disease and other diseases of the circulatory system: Secondary | ICD-10-CM

## 2020-07-18 DIAGNOSIS — I5032 Chronic diastolic (congestive) heart failure: Secondary | ICD-10-CM | POA: Diagnosis present

## 2020-07-18 DIAGNOSIS — E876 Hypokalemia: Secondary | ICD-10-CM | POA: Diagnosis not present

## 2020-07-18 DIAGNOSIS — B9562 Methicillin resistant Staphylococcus aureus infection as the cause of diseases classified elsewhere: Secondary | ICD-10-CM | POA: Diagnosis not present

## 2020-07-18 DIAGNOSIS — R6889 Other general symptoms and signs: Secondary | ICD-10-CM | POA: Diagnosis not present

## 2020-07-18 DIAGNOSIS — D649 Anemia, unspecified: Secondary | ICD-10-CM | POA: Diagnosis not present

## 2020-07-18 DIAGNOSIS — Z803 Family history of malignant neoplasm of breast: Secondary | ICD-10-CM

## 2020-07-18 DIAGNOSIS — E1169 Type 2 diabetes mellitus with other specified complication: Secondary | ICD-10-CM | POA: Diagnosis present

## 2020-07-18 DIAGNOSIS — R52 Pain, unspecified: Secondary | ICD-10-CM

## 2020-07-18 DIAGNOSIS — Z7189 Other specified counseling: Secondary | ICD-10-CM | POA: Diagnosis not present

## 2020-07-18 DIAGNOSIS — G4733 Obstructive sleep apnea (adult) (pediatric): Secondary | ICD-10-CM | POA: Diagnosis present

## 2020-07-18 DIAGNOSIS — Z515 Encounter for palliative care: Secondary | ICD-10-CM | POA: Diagnosis not present

## 2020-07-18 DIAGNOSIS — Z885 Allergy status to narcotic agent status: Secondary | ICD-10-CM

## 2020-07-18 LAB — URINALYSIS, COMPLETE (UACMP) WITH MICROSCOPIC
Bilirubin Urine: NEGATIVE
Glucose, UA: NEGATIVE mg/dL
Ketones, ur: NEGATIVE mg/dL
Nitrite: NEGATIVE
Protein, ur: 30 mg/dL — AB
Specific Gravity, Urine: 1.019 (ref 1.005–1.030)
WBC, UA: >50 WBC/hpf — ABNORMAL HIGH (ref 0–5)
pH: 5 (ref 5.0–8.0)

## 2020-07-18 LAB — CBC WITH DIFFERENTIAL/PLATELET
Abs Immature Granulocytes: 0.08 10*3/uL — ABNORMAL HIGH (ref 0.00–0.07)
Basophils Absolute: 0.1 10*3/uL (ref 0.0–0.1)
Basophils Relative: 0 %
Eosinophils Absolute: 0.7 10*3/uL — ABNORMAL HIGH (ref 0.0–0.5)
Eosinophils Relative: 6 %
HCT: 32.6 % — ABNORMAL LOW (ref 36.0–46.0)
Hemoglobin: 10.1 g/dL — ABNORMAL LOW (ref 12.0–15.0)
Immature Granulocytes: 1 %
Lymphocytes Relative: 29 %
Lymphs Abs: 3.4 10*3/uL (ref 0.7–4.0)
MCH: 28.1 pg (ref 26.0–34.0)
MCHC: 31 g/dL (ref 30.0–36.0)
MCV: 90.6 fL (ref 80.0–100.0)
Monocytes Absolute: 1 10*3/uL (ref 0.1–1.0)
Monocytes Relative: 9 %
Neutro Abs: 6.7 10*3/uL (ref 1.7–7.7)
Neutrophils Relative %: 55 %
Platelets: 320 10*3/uL (ref 150–400)
RBC: 3.6 MIL/uL — ABNORMAL LOW (ref 3.87–5.11)
RDW: 18.4 % — ABNORMAL HIGH (ref 11.5–15.5)
WBC: 11.9 10*3/uL — ABNORMAL HIGH (ref 4.0–10.5)
nRBC: 0 % (ref 0.0–0.2)

## 2020-07-18 LAB — LACTIC ACID, PLASMA
Lactic Acid, Venous: 1 mmol/L (ref 0.5–1.9)
Lactic Acid, Venous: 0.9 mmol/L (ref 0.5–1.9)

## 2020-07-18 LAB — SARS CORONAVIRUS 2 BY RT PCR (HOSPITAL ORDER, PERFORMED IN ~~LOC~~ HOSPITAL LAB): SARS Coronavirus 2: NEGATIVE

## 2020-07-18 LAB — COMPREHENSIVE METABOLIC PANEL
ALT: 14 U/L (ref 0–44)
AST: 24 U/L (ref 15–41)
Albumin: 2.4 g/dL — ABNORMAL LOW (ref 3.5–5.0)
Alkaline Phosphatase: 83 U/L (ref 38–126)
Anion gap: 13 (ref 5–15)
BUN: 129 mg/dL — ABNORMAL HIGH (ref 8–23)
CO2: 27 mmol/L (ref 22–32)
Calcium: 10.9 mg/dL — ABNORMAL HIGH (ref 8.9–10.3)
Chloride: 99 mmol/L (ref 98–111)
Creatinine, Ser: 4.15 mg/dL — ABNORMAL HIGH (ref 0.44–1.00)
GFR, Estimated: 10 mL/min — ABNORMAL LOW (ref >60)
Glucose, Bld: 102 mg/dL — ABNORMAL HIGH (ref 70–99)
Potassium: 5.2 mmol/L — ABNORMAL HIGH (ref 3.5–5.1)
Sodium: 139 mmol/L (ref 135–145)
Total Bilirubin: 0.7 mg/dL (ref 0.3–1.2)
Total Protein: 7 g/dL (ref 6.5–8.1)

## 2020-07-18 LAB — PROCALCITONIN: Procalcitonin: 0.51 ng/mL

## 2020-07-18 MED ORDER — SODIUM CHLORIDE 0.9 % IV SOLN
2.0000 g | Freq: Once | INTRAVENOUS | Status: AC
  Administered 2020-07-18: 2 g via INTRAVENOUS
  Filled 2020-07-18: qty 2

## 2020-07-18 MED ORDER — SODIUM CHLORIDE 0.9 % IV SOLN
2.0000 g | INTRAVENOUS | Status: DC
  Administered 2020-07-19 – 2020-07-21 (×3): 2 g via INTRAVENOUS
  Filled 2020-07-18: qty 2
  Filled 2020-07-18: qty 20
  Filled 2020-07-18 (×2): qty 2

## 2020-07-18 MED ORDER — VANCOMYCIN HCL IN DEXTROSE 1-5 GM/200ML-% IV SOLN
1000.0000 mg | Freq: Once | INTRAVENOUS | Status: AC
  Administered 2020-07-18: 1000 mg via INTRAVENOUS
  Filled 2020-07-18: qty 200

## 2020-07-18 MED ORDER — ASPIRIN EC 325 MG PO TBEC
325.0000 mg | DELAYED_RELEASE_TABLET | ORAL | Status: DC
  Administered 2020-07-19: 325 mg via ORAL
  Filled 2020-07-18: qty 1

## 2020-07-18 MED ORDER — INSULIN ASPART 100 UNIT/ML ~~LOC~~ SOLN
0.0000 [IU] | SUBCUTANEOUS | Status: DC
  Administered 2020-07-23: 1 [IU] via SUBCUTANEOUS
  Administered 2020-07-23: 2 [IU] via SUBCUTANEOUS
  Administered 2020-07-24 – 2020-07-28 (×2): 1 [IU] via SUBCUTANEOUS
  Filled 2020-07-18 (×5): qty 1

## 2020-07-18 MED ORDER — ACETAMINOPHEN 325 MG PO TABS
650.0000 mg | ORAL_TABLET | Freq: Four times a day (QID) | ORAL | Status: DC | PRN
  Administered 2020-07-19 – 2020-07-20 (×2): 650 mg via ORAL
  Filled 2020-07-18 (×2): qty 2

## 2020-07-18 MED ORDER — SODIUM CHLORIDE 0.9 % IV SOLN
75.0000 mL/h | INTRAVENOUS | Status: AC
  Administered 2020-07-18: 75 mL/h via INTRAVENOUS

## 2020-07-18 MED ORDER — IPRATROPIUM-ALBUTEROL 0.5-2.5 (3) MG/3ML IN SOLN
3.0000 mL | Freq: Four times a day (QID) | RESPIRATORY_TRACT | Status: DC
  Administered 2020-07-19: 3 mL via RESPIRATORY_TRACT
  Filled 2020-07-18: qty 3

## 2020-07-18 MED ORDER — SODIUM CHLORIDE 0.9 % IV SOLN
500.0000 mg | INTRAVENOUS | Status: DC
  Administered 2020-07-19 – 2020-07-21 (×3): 500 mg via INTRAVENOUS
  Filled 2020-07-18: qty 131.58
  Filled 2020-07-18 (×2): qty 500
  Filled 2020-07-18 (×2): qty 131.58

## 2020-07-18 MED ORDER — SODIUM CHLORIDE 0.9 % IV SOLN: Freq: Once | INTRAVENOUS | Status: AC

## 2020-07-18 MED ORDER — ALBUTEROL SULFATE (2.5 MG/3ML) 0.083% IN NEBU
2.5000 mg | INHALATION_SOLUTION | RESPIRATORY_TRACT | Status: DC | PRN
  Filled 2020-07-18: qty 3

## 2020-07-18 MED ORDER — ACETAMINOPHEN 650 MG RE SUPP: 650.0000 mg | Freq: Four times a day (QID) | RECTAL | Status: DC | PRN

## 2020-07-18 NOTE — H&P (Signed)
[name] INO:[id number] DOB: August 12, 1929 DOA: 07/18/2020    PCP: Biagio Borg, MD   Outpatient Specialists:   CARDS:   Dr. Haroldine Laws NS Dr. Monika Salk   Patient arrived to ER on 07/18/20 at 1542 Referred by Attending Nance Pear, MD   Patient coming from:   From facility    Chief Complaint:   Chief Complaint  Patient presents with  . Hypotension    HPI: [name] is a 87 y.o. female with medical history significant of dementia, diastolic CHF  s/p Lumbar fusion due to L3 frx and repair of traumatic CSF leak Oct 2021, anemia, atrial flutter, breast cancer status post radiation therapy, CKD stage III, history of CVA, history of DVT left leg postoperatively, is a   esophageal stricture, GERD,  C. difficile infection, left foot pressure ulcer, severe osteopenia, falls     Presented with  Hypotension initial BP at facility down to 96/34 EMS gave 500 ml bolus and BP was 131/48 Per nursing staff patient have had decreased urine output  Patient history significant for full in October 2021 resulting in L3 burst fracture requiring L2-L4 repair and spinal fusion with repair of a traumatic CSF leak.  Later on was complicated by wound dehiscence in November 2021 admit required admission to [place] placement Thereafter she was discharged to SNF. Hospital stay was complicated by UTI in the setting of an indwelling Foley catheter positive for E. coli and staph hemolyticus she was treated with long-term IV antibiotics and had a PICC line placed as per recommendation by ID treatment for antibiotic therapy was ceftriaxone 2 g daily from 12 November to 19 June 2020  Since her discharge to SNF patient is followed up with neurosurgery She developed pressure ulcer on her left foot  Infectious risk factors:  Reports none    Has  been vaccinated against COVID    Initial COVID TEST  NEGATIVE   Lab Results  Component Value Date   [place] 07/18/2020    [place] NEGATIVE 04/22/2020   [place] NEGATIVE 05/29/2019   [place] NEGATIVE 12/22/2018    Regarding pertinent Chronic problems:     Hyperlipidemia -  not on statins Lipid Panel     Component Value Date/Time   CHOL 191 03/26/2019 1544   TRIG 232.0 (H) 03/26/2019 1544   HDL 36.00 (L) 03/26/2019 1544   CHOLHDL 5 03/26/2019 1544   VLDL 46.4 (H) 03/26/2019 1544   LDLCALC 124 (H) 06/25/2018 1637   LDLDIRECT 129.0 03/26/2019 1544     HTN on Norvasc lisinopril  chronic CHF diastolic - last echo 0962 (grade 2 diastolic  dysfunction).         On Lasix      OSA - on O2 family feels she needs a mask because she is mouth breather    obesity-   BMI Readings from Last 1 Encounters:  07/18/20 35.95 kg/m     COPD - not followed by pulmonology   not  on baseline oxygen      Hx of CVA -  With  residual deficits on Aspirin  325,     A. Flutter  -  - CHA2DS2 vas score >3  Not on anticoagulation secondary to Risk of Falls,  recurrent bleeding         -  Rate controled   CKD stage IIIa- baseline Cr 1.3 Estimated Creatinine Clearance: 10.9 mL/min (A) (by C-G formula based on SCr of 4.15 mg/dL (H)).  Lab  Results  Component Value Date   CREATININE 4.15 (H) 07/18/2020   CREATININE 1.06 (H) 04/22/2020   CREATININE 1.3 (A) 03/30/2020     Chronic anemia - baseline hg Hemoglobin & Hematocrit  Recent Labs    03/27/20 0000 04/22/20 1312 07/18/20 1554  HGB 8.5* 11.9* 10.1*    While in ER: Noted to have evidence of AKI With creatinine up to 4.1 Urine with large amount of white blood cells negative nitrates and few bacteria but budding yeast present  Blood and urine culture ordered  Covid was negative Chest x-ray showing bibasilar opacities likely atelectasis Patient was started on cefepime and Vanco   Hospitalist was called for admission for hypotension  The following Work up has been ordered so far:  Orders Placed This Encounter  Procedures  . Urine  culture  . Blood Culture (routine x 2)  . SARS Coronavirus 2 by RT PCR (hospital order, performed in [facility] hospital lab) Nasopharyngeal Nasopharyngeal Swab  . DG Chest Port 1 View  . Lactic acid, plasma  . Comprehensive metabolic panel  . CBC WITH DIFFERENTIAL  . Urinalysis, Complete w Microscopic  . Procalcitonin - Baseline  . Diet NPO time specified  . Cardiac monitoring  . Document height and weight  . Assess and Document Glasgow Coma Scale  . Refer to Sidebar Report: Sepsis Bundle ED/IP  . Initiate Carrier Fluid Protocol  . Consult to hospitalist  ALL PATIENTS BEING ADMITTED/HAVING PROCEDURES NEED COVID-19 SCREENING  . Airborne and Contact precautions  . Pulse oximetry, continuous  . ED EKG 12-Lead  . EKG 12-Lead  . Insert peripheral IV X 1     Following Medications were ordered in ER: Medications  0.9 %  sodium chloride infusion ( Intravenous New Bag/Given 07/18/20 1738)  vancomycin (VANCOCIN) IVPB 1000 mg/200 mL premix (1,000 mg Intravenous New Bag/Given 07/18/20 2108)  ceFEPIme (MAXIPIME) 2 g in sodium chloride 0.9 % 100 mL IVPB (0 g Intravenous Stopped 07/18/20 2104)        Consult Orders  (From admission, onward)         Start     Ordered   07/18/20 2219  Consult to hospitalist  ALL PATIENTS BEING ADMITTED/HAVING PROCEDURES NEED COVID-19 SCREENING  Once       Comments: ALL PATIENTS BEING ADMITTED/HAVING PROCEDURES NEED COVID-19 SCREENING  Provider:  (Not yet assigned)  Question Answer Comment  Place call to: hospitalists   Reason for Consult Admit   Diagnosis/Clinical Info for Consult: Jethro Bolus, uti      07/18/20 2218           Significant initial  Findings: Abnormal Labs Reviewed  COMPREHENSIVE METABOLIC PANEL - Abnormal; Notable for the following components:      Result Value   Potassium 5.2 (*)    Glucose, Bld 102 (*)    BUN 129 (*)    Creatinine, Ser 4.15 (*)    Calcium 10.9 (*)    Albumin 2.4 (*)    GFR, Estimated 10 (*)    All other  components within normal limits  CBC WITH DIFFERENTIAL/PLATELET - Abnormal; Notable for the following components:   WBC 11.9 (*)    RBC 3.60 (*)    Hemoglobin 10.1 (*)    HCT 32.6 (*)    RDW 18.4 (*)    Eosinophils Absolute 0.7 (*)    Abs Immature Granulocytes 0.08 (*)    All other components within normal limits  URINALYSIS, COMPLETE (UACMP) WITH MICROSCOPIC - Abnormal; Notable for the  following components:   Color, Urine YELLOW (*)    APPearance TURBID (*)    Hgb urine dipstick SMALL (*)    Protein, ur 30 (*)    Leukocytes,Ua LARGE (*)    WBC, UA >50 (*)    Bacteria, UA FEW (*)    All other components within normal limits  CK - Abnormal; Notable for the following components:   Total CK 373 (*)    All other components within normal limits  MAGNESIUM - Abnormal; Notable for the following components:   Magnesium 2.6 (*)    All other components within normal limits  BASIC METABOLIC PANEL - Abnormal; Notable for the following components:   Potassium 5.3 (*)    Glucose, Bld 130 (*)    BUN 129 (*)    Creatinine, Ser 3.94 (*)    Calcium 10.6 (*)    GFR, Estimated 10 (*)    All other components within normal limits  BLOOD GAS, VENOUS - Abnormal; Notable for the following components:   Bicarbonate 29.4 (*)    Acid-Base Excess 2.4 (*)    All other components within normal limits  CBG MONITORING, ED - Abnormal; Notable for the following components:   Glucose-Capillary 115 (*)    All other components within normal limits    Otherwise labs showing:    Recent Labs  Lab 07/18/20 1554 07/18/20 2342  NA 139 139  K 5.2* 5.3*  CO2 27 25  GLUCOSE 102* 130*  BUN 129* 129*  CREATININE 4.15* 3.94*  CALCIUM 10.9* 10.6*  MG  --  2.6*    Cr    Up from baseline see below Lab Results  Component Value Date   CREATININE 4.15 (H) 07/18/2020   CREATININE 1.06 (H) 04/22/2020   CREATININE 1.3 (A) 03/30/2020    Recent Labs  Lab 07/18/20 1554  AST 24  ALT 14  ALKPHOS 83  BILITOT  0.7  PROT 7.0  ALBUMIN 2.4*   Lab Results  Component Value Date   CALCIUM 10.6 (H) 07/18/2020   PHOS 3.1 08/15/2013   WBC       Component Value Date/Time   WBC 11.9 (H) 07/18/2020 1554   LYMPHSABS 3.4 07/18/2020 1554   LYMPHSABS 2.0 04/11/2017 1308   MONOABS 1.0 07/18/2020 1554   MONOABS 0.7 04/11/2017 1308   EOSABS 0.7 (H) 07/18/2020 1554   EOSABS 0.3 04/11/2017 1308   BASOSABS 0.1 07/18/2020 1554   BASOSABS 0.1 04/11/2017 1308     Plt: Lab Results  Component Value Date   PLT 320 07/18/2020   Lactic Acid, Venous    Component Value Date/Time   LATICACIDVEN 0.9 07/18/2020 2105    Procalcitonin 0.5   HG/HCT  stable,       Component Value Date/Time   HGB 10.1 (L) 07/18/2020 1554   HGB 12.7 04/11/2017 1308   HCT 32.6 (L) 07/18/2020 1554   HCT 39.3 04/11/2017 1308   MCV 90.6 07/18/2020 1554   MCV 93.0 04/11/2017 1308       Cardiac Panel (last 3 results) Recent Labs    07/18/20 2342  CKTOTAL 373*     ECG: Ordered Personally reviewed by me showing: HR : 62 Rhythm:  NSR,    nonspecific changes, QTC*435     DM  labs:  HbA1C: No results for input(s): HGBA1C in the last 8760 hours.     CBG (last 3)  No results for input(s): GLUCAP in the last 72 hours.     UA yeast, increased  Leukoesterase   Urine analysis:    Component Value Date/Time   COLORURINE YELLOW (A) 07/18/2020 2026   APPEARANCEUR TURBID (A) 07/18/2020 2026   LABSPEC 1.019 07/18/2020 2026   PHURINE 5.0 07/18/2020 2026   GLUCOSEU NEGATIVE 07/18/2020 2026   GLUCOSEU NEGATIVE 05/13/2019 1349   HGBUR SMALL (A) 07/18/2020 2026   BILIRUBINUR NEGATIVE 07/18/2020 2026   BILIRUBINUR neg 12/09/2014 1506   KETONESUR NEGATIVE 07/18/2020 2026   PROTEINUR 30 (A) 07/18/2020 2026   UROBILINOGEN 0.2 05/13/2019 1349   NITRITE NEGATIVE 07/18/2020 2026   LEUKOCYTESUR LARGE (A) 07/18/2020 2026      Ordered   CXR - bilateral infiltrates vs atelectasis   ED Triage Vitals  Enc Vitals Group     BP  07/18/20 1604 (!) 122/47     Pulse Rate 07/18/20 1604 61     Resp 07/18/20 1604 18     Temp --      Temp src --      SpO2 07/18/20 1604 100 %     Weight 07/18/20 1603 216 lb 0.8 oz (98 kg)     Height 07/18/20 1603 5\' 5"  (1.651 m)     Head Circumference --      Peak Flow --      Pain Score --      Pain Loc --      Pain Edu? --      Excl. in [place]? --   TMAX(24)@       Latest  Blood pressure 106/76, pulse 65, resp. rate 20, height 5\' 5"  (1.651 m), weight 98 kg, SpO2 99 %.    Review of Systems:    Pertinent positives include:   Fatigue, decreased Po intake  Constitutional:  No weight loss, night sweats, Fevers, chills,, weight loss  HEENT:  No headaches, Difficulty swallowing,Tooth/dental problems,Sore throat,  No sneezing, itching, ear ache, nasal congestion, post nasal drip,  Cardio-vascular:  No chest pain, Orthopnea, PND, anasarca, dizziness, palpitations.no Bilateral lower extremity swelling  GI:  No heartburn, indigestion, abdominal pain, nausea, vomiting, diarrhea, change in bowel habits, loss of appetite, melena, blood in stool, hematemesis Resp:  no shortness of breath at rest. No dyspnea on exertion, No excess mucus, no productive cough, No non-productive cough, No coughing up of blood.No change in color of mucus.No wheezing. Skin:  no rash or lesions. No jaundice GU:  no dysuria, change in color of urine, no urgency or frequency. No straining to urinate.  No flank pain.  Musculoskeletal:  No joint pain or no joint swelling. No decreased range of motion. No back pain.  Psych:  No change in mood or affect. No depression or anxiety. No memory loss.  Neuro: no localizing neurological complaints, no tingling, no weakness, no double vision, no gait abnormality, no slurred speech, no confusion  All systems reviewed and apart from [place] all are negative  Past Medical History:   Past Medical History:  Diagnosis Date  . Anemia   . Anxiety   . Atrial flutter ([place])     "sometimes"  . Blood transfusion   . Breast cancer ([place])    right breast  . Bronchitis   . Chronic kidney disease    occ uti's  . CKD (chronic kidney disease) stage 3, GFR 30-59 ml/min ([name])   . Colon polyps    hyperplastic  . CVA (cerebral infarction)   . Diverticulosis   . DVT of leg (deep venous thrombosis) ([name])    left; S/P OR  . Dysrhythmia  dr bensimhon   . Esophageal stricture   . GERD (gastroesophageal reflux disease)   . Gout   . Hiatal hernia    4 cm  . History of radiation therapy 02/15/12-04/02/12   right breast 4680 cGy/26 sessions, right boost=1400cGy /7 sessions  . Hyperlipidemia   . Hypertension   . IBS (irritable bowel syndrome)   . Kidney stones 1990's  . Morbid obesity ([place])   . Osteoarthritis   . Personal history of radiation therapy   . Pneumonia    "walking"  . PONV (postoperative nausea and vomiting)   . Shingles 02/21/2011  . Shortness of breath on exertion   . Skin cancer   . Stroke [facility]) 2002   residual:  "little tingling in palm of left hand"  . UTI (lower urinary tract infection)    "I've had a few"     Past Surgical History:  Procedure Laterality Date  . ABDOMINAL HYSTERECTOMY    . BILE DUCT STENT PLACEMENT  ~ 01/2011  . BREAST LUMPECTOMY  06/29/11   right  . BREAST LUMPECTOMY  06/29/2011   Procedure: BREAST LUMPECTOMY WITH EXCISION OF SENTINEL NODE;  Surgeon: Pedro Earls, MD;  Location: [facility];  Service: General;  Laterality: Right;  right sentinel node mapping,right sentinel node biopsy, needle localization right breast lumpectomy  . CATARACT EXTRACTION W/ INTRAOCULAR LENS  IMPLANT, BILATERAL  1990's  . CHOLECYSTECTOMY  03/23/11   lap chole   . COLON SURGERY     hole in intestine ,tumors?  . colonic perforation repair  ~ 2000  . [place]  2012  . INCISION AND DRAINAGE ABSCESS N/A 08/12/2013   Procedure: INCISION AND DRAINAGE ABSCESS;  Surgeon: Earnstine Regal, MD;  Location: [facility];  Service: General;  Laterality: N/A;   . INCISION AND DRAINAGE ABSCESS N/A 08/20/2013   Procedure: INCISION AND DRAINAGE ABSCESS;  Surgeon: Ralene Ok, MD;  Location: [facility];  Service: General;  Laterality: N/A;  . INCISION AND DRAINAGE BREAST ABSCESS     right breast seroma  . IRRIGATION AND DEBRIDEMENT ABSCESS N/A 08/14/2013   Procedure: Debridment of perineal tissue and debridement of perineal wound;  Surgeon: Earnstine Regal, MD;  Location: [facility];  Service: General;  Laterality: N/A;  . IRRIGATION AND DEBRIDEMENT ABSCESS N/A 08/16/2013   Procedure: DRESSING CHANGE AND DEBRIDEMENT OF PERINEUM WOUND;  Surgeon: Earnstine Regal, MD;  Location: [facility];  Service: General;  Laterality: N/A;  . JOINT REPLACEMENT    . MASS EXCISION Right 12/26/2018   Procedure: EXCISION RIGHT ARM SKIN MASS;  Surgeon: Erroll Luna, MD;  Location: [facility];  Service: General;  Laterality: Right;  . MINOR APPLICATION OF WOUND VAC N/A 08/20/2013   Procedure: MINOR APPLICATION OF WOUND VAC;  Surgeon: Ralene Ok, MD;  Location: [facility];  Service: General;  Laterality: N/A;  . pelvic bone tumor removal     twice in 2000's  . PILONIDAL CYST DRAINAGE N/A 08/19/2013   Procedure: Dressing change perineum;  Surgeon: Ralene Ok, MD;  Location: [facility];  Service: General;  Laterality: N/A;  . REPLACEMENT TOTAL KNEE BILATERAL  ~ 2008; ~ 2010   right; left  . TONSILLECTOMY  ~ 1946  . TOTAL HIP ARTHROPLASTY  1980's   right    Social History:  Ambulatory  bed bound     reports that she has never smoked. She has never used smokeless tobacco. She reports that she does not drink alcohol and does not use drugs.  Family History:   Family History  Problem Relation Age of Onset  . Breast cancer Sister        x 2  . Prostate cancer Brother   . Stroke Mother   . Kidney failure Sister   . Diabetes Sister   . Heart disease Paternal Uncle        multiple  . Heart disease Maternal Uncle        multiple  . Colon cancer Neg Hx     Allergies: Allergies  Allergen Reactions  . Ace Inhibitors Other (See Comments)    Tolerates lisinopril at home. Pt does not recall allergy.  . Atorvastatin     REACTION: myalgias  . Oxycodone-Acetaminophen     REACTION: confusion, fatigue  . Rosuvastatin     REACTION: leg weakness  . Simvastatin     REACTION: leg weakness  . Codeine Nausea And Vomiting and Rash  . Sulfonamide Derivatives Hives and Rash     Prior to Admission medications   Medication Sig Start Date End Date Taking? Authorizing Provider  acetaminophen (TYLENOL) 325 MG tablet Take 650 mg by mouth every 6 (six) hours as needed (arthritis).     [provider]  acetaZOLAMIDE (DIAMOX) 250 MG tablet Take 250 mg by mouth 3 (three) times daily.    [provider]  allopurinol (ZYLOPRIM) 100 MG tablet TAKE 2 TABLETS BY MOUTH ALTERNATING WITH 1 TABLET EVERY OTHER DAY 08/05/19   Biagio Borg, MD  allopurinol (ZYLOPRIM) 100 MG tablet Take 100 mg by mouth every other day.    [provider]  amLODipine (NORVASC) 5 MG tablet TAKE 1 TABLET BY MOUTH ONCE A DAY 01/27/20   Biagio Borg, MD  aspirin 325 MG EC tablet Take 1 tablet (325 mg total) by mouth every other day. 06/23/14   Hosie Poisson, MD  Ensure (ENSURE) Take 237 mLs by mouth 2 (two) times daily between meals.    [provider]  ipratropium-albuterol (DUONEB) 0.5-2.5 (3) MG/3ML SOLN Take 3 mLs by nebulization in the morning and at bedtime. At Morning and Bedtime.    [provider]  lisinopril (ZESTRIL) 40 MG tablet Take 40 mg by mouth daily.    [provider]  Magnesium Hydroxide (MILK OF MAGNESIA PO) Take 30 mLs by mouth daily.    [provider]  Multiple Vitamin (MULTIVITAMIN ADULT PO) Take 1 tablet by mouth daily.    [provider]   Physical Exam: Vitals with BMI 07/18/2020 07/18/2020 07/18/2020  Height - - -  Weight - - -  BMI - - -  Systolic 527 782 423  Diastolic 76 49 87  Pulse 65 64 75     1. General:  in No Acute distress   Chronically ill  -appearing 2. Psychological: Alert and   Oriented to self  3. Head/ENT:    Dry Mucous Membranes                          Head Non traumatic, neck supple                           Poor Dentition 4. SKIN:  decreased Skin turgor,  Skin clean Dry  Left heel pressure ulcer 5. Heart: Regular rate and rhythm no Murmur, no Rub or gallop 6. Lungs:   no wheezes or crackles   7. Abdomen: Soft,  non-tender, Non distended bowel sounds  present 8. Lower extremities: no clubbing, cyanosis, no edema 9. Neurologically    strength  Diminished in all 4 extremities cranial nerves II through XII intact, tremor noted 10. MSK: Normal range of motion  All other LABS:     Recent Labs  Lab 07/18/20 1554  WBC 11.9*  NEUTROABS 6.7  HGB 10.1*  HCT 32.6*  MCV 90.6  PLT 320     Recent Labs  Lab 07/18/20 1554  NA 139  K 5.2*  CL 99  CO2 27  GLUCOSE 102*  BUN 129*  CREATININE 4.15*  CALCIUM 10.9*     Recent Labs  Lab 07/18/20 1554  AST 24  ALT 14  ALKPHOS 83  BILITOT 0.7  PROT 7.0  ALBUMIN 2.4*   Cultures:    Component Value Date/Time   SDES  11/21/2017 1128    WOUND FOOT Performed at [facility], [place] [address] [place], [place] [address]    SPECREQUEST  11/21/2017 1128    NONE Performed at [facility], [place] [address] [place], [place] [address]    CULT  11/21/2017 [place] Performed at [facility], [place] [address], [place], [place] [address]    REPTSTATUS 11/26/2017 FINAL 11/21/2017 1128     Radiological Exams on Admission: US RENAL  Result Date: 07/19/2020 CLINICAL DATA:  Acute renal failure EXAM: RENAL / URINARY TRACT ULTRASOUND COMPLETE COMPARISON:  10/19/2017 FINDINGS: Right Kidney: Renal measurements: 10.2 x 4.8 x 5.2 cm = volume: 133 mL. Cortical thinning and increased echotexture. No mass or hydronephrosis. Left  Kidney: Renal measurements: 9.7 x 4.5 x 4.2 cm = volume: 95 mL. Cortical thinning and increased echotexture. No mass or hydronephrosis. Bladder: Not visualized, decompressed with Foley catheter in place. Other: None. IMPRESSION: Cortical thinning and increased echotexture bilaterally compatible with chronic medical renal disease. No hydronephrosis. Electronically Signed   By: Rolm Baptise M.D.   On: 07/19/2020 00:16   DG Chest Port 1 View  Result Date: 07/18/2020 CLINICAL DATA:  Hypotension EXAM: PORTABLE CHEST 1 VIEW COMPARISON:  07/18/2020 FINDINGS: Patient is rotated to the right, limiting study. Bibasilar atelectasis or infiltrates, right greater than left. Findings stable since prior study IMPRESSION: No significant change since recent study. Electronically Signed   By: Rolm Baptise M.D.   On: 07/18/2020 23:38   DG Chest Port 1 View  Result Date: 07/18/2020 CLINICAL DATA:  Sepsis EXAM: PORTABLE CHEST 1 VIEW COMPARISON:  May 29th first 2019 FINDINGS: Evaluation is limited secondary to patient rotation. Similar cardiomediastinal silhouette when allowing for marked patient rotation. Small bilateral pleural effusions. Bibasilar heterogeneous predominately linear opacities. No pneumothorax. Degenerative changes of the thoracic spine. IMPRESSION: Limited evaluation secondary to patient rotation. Bibasilar opacities likely reflect atelectasis but superimposed infection could present similarly. Recommend repeat PA and lateral chest radiograph for improved evaluation. Electronically Signed   By: Valentino Saxon MD   On: 07/18/2020 17:09    Chart has been reviewed    Assessment/Plan   87 y.o. female with medical history significant of dementia, diastolic CHF  s/p Lumbar fusion due to L3 frx and repair of traumatic CSF leak Oct 2021, anemia, atrial flutter, breast cancer status post radiation therapy, CKD stage III, history of CVA, history of DVT left leg postoperatively, is a   esophageal stricture,  GERD,  C. difficile infection, left foot pressure ulcer, severe osteopenia, falls   Admitted for AKI and dehydration, DM2  Present on  Admission: .  Acute metabolic encephalopathy -   - most likely multifactorial secondary to combination of  infection  dehydration secondary to decreased by mouth intake,    - Will rehydrate   - treat underlining infection   - Hold contributing medications   - if no improvement may need further imaging to evaluate for CNS pathology pathology such as MRI of the brain   - neurological exam appears to be nonfocal but patient unable to cooperate fully   - VBG unremarkable no evidence of hypercarbia     . Wound healing, delayed -as well as pressure ulcer on left heel.  Will appreciate wound care consult  . Protein-calorie malnutrition, severe ([place]) -check prealbumin order nutritional consult  . Hyperlipidemia -chronic stable not on statin statin  . Essential hypertension -hold home medications given hypotension admission  . CKD (chronic kidney disease) stage 3, GFR 30-59 ml/min ([name]) acute on chronic renal failure likely secondary to severe dehydration. Obtain urine electrolytes Obtain renal ultrasound   -chronic avoid nephrotoxic medications such as NSAIDs, Vanco Zosyn combo,  avoid hypotension, continue to follow renal function  . Chronic diastolic CHF (congestive heart failure) ([name]) currently appears to be in a dry side will hold Lasix  . AKI (acute kidney injury) ([place]) -hold lisinopril rehydrate and continue to follow Obtain renal ultrasound if does not improve may need nephrology consult RENAL US showing no evidence of hydronephrosis CK elevated continue to rehydrate and repeat in AM . Dehydration -rehydrate and follow  . Hyperkalemia -repeat after fluid resuscitation and treat as needed Monitor on telemetry hold lisinopril hold potassium  . CAP (community acquired pneumonia) -unclear if true pneumonia chest x-ray portable unable to tolerate 2 view  at this time may need additional imaging at a later time.  For tonight we will cover the Rocephin azithromycin Obtain strep antigen Blood in sputum culture  Other plan as per orders. Initially not meeting sepsis criteria Speech pathology evaluate  Abnormal UA -unclear if source of infection versus, dirty urine secondary to chronically indwelling Foley.  Candida present.  Await results of urine culture to see if needs to be treated   DM2-  - Order Sensitive   SSI    -  check TSH and HgA1C     DVT prophylaxis:  SCD   Code Status:    Code Status: Prior FULL CODE  as per family  I had personally discussed CODE STATUS with   Family      Family Communication:   Family not at  Bedside  plan of care was discussed on the phone with   Son   Disposition Plan:     Back to current facility when stable  Family would like to be placed in a different facility                           Following barriers for discharge:                            Electrolytes corrected                          able to transition to PO antibiotics                             Will need to be able to tolerate PO  Would benefit from PT/OT eval prior to DC  Ordered                   Swallow eval - SLP ordered                                      Transition of care consulted                   Nutrition    consulted                  Wound care  consulted                 Consults called: none   Admission status:  ED Disposition    ED Disposition Condition [place]: [place] [[id number]]  Level of Care: Med-Surg [16]  Covid Evaluation: Confirmed COVID Negative  Diagnosis: AKI (acute kidney injury) [facility]) [[id number]]  Admitting Physician: Toy Baker [[id number]]  Attending Physician: Toy Baker [[id number]]  Estimated length of stay: past midnight tomorrow  Certification:: I certify this patient will need inpatient services for at least  2 midnights       Patient meeting criteria for inpatient hospitalization given significant AKI inability to tolerate p.o. needing IV fluids.    Level of care    tele    24H       Lab Results  Component Value Date   [place] NEGATIVE 07/18/2020     Precautions: admitted as   Covid Negative  No active isolations    PPE: Used by the provider:   N95  eye Goggles,  Gloves    Meesha Sek 07/18/2020, 1:20 AM    [facility]     after 2 AM please page floor coverage PA If 7AM-7PM, please contact the day team taking care of the patient using [email or web address]   Patient was evaluated in the context of the global COVID-19 pandemic, which necessitated consideration that the patient might be at risk for infection with the SARS-CoV-2 virus that causes COVID-19. Institutional protocols and algorithms that pertain to the evaluation of patients at risk for COVID-19 are in a state of rapid change based on information released by regulatory bodies including the CDC and federal and state organizations. These policies and algorithms were followed during the patient's care.

## 2020-07-18 NOTE — ED Notes (Signed)
This RN and Larene Beach RN noted sacral pressure ulcer with dressing dated for 07/17/20. This RN and Multimedia programmer changed sacral pressure dressing at this time and placed clean chux and brief at this time. Pt given blankets at this time.

## 2020-07-18 NOTE — ED Provider Notes (Signed)
[facility] Emergency Department Provider Note  ____________________________________________   I have reviewed the triage vital signs and the nursing notes.   HISTORY  Chief Complaint Hypotension   History limited by and level 5 caveat due to: Mental status  HPI [name] is a 87 y.o. female who presents to the emergency department today from living facility because of concern for low blood pressure. unfortunately the patient herself cannot give any significant history. Per report she is somewhat less responsive than normal.  Also report that the patient has had decreased urine output today.   Records reviewed. Per medical record review patient has a history of CKD  Past Medical History:  Diagnosis Date  . Anemia   . Anxiety   . Atrial flutter ([place])    "sometimes"  . Blood transfusion   . Breast cancer ([place])    right breast  . Bronchitis   . Chronic kidney disease    occ uti's  . CKD (chronic kidney disease) stage 3, GFR 30-59 ml/min ([name])   . Colon polyps    hyperplastic  . CVA (cerebral infarction)   . Diverticulosis   . DVT of leg (deep venous thrombosis) ([name])    left; S/P OR  . Dysrhythmia    dr bensimhon   . Esophageal stricture   . GERD (gastroesophageal reflux disease)   . Gout   . Hiatal hernia    4 cm  . History of radiation therapy 02/15/12-04/02/12   right breast 4680 cGy/26 sessions, right boost=1400cGy /7 sessions  . Hyperlipidemia   . Hypertension   . IBS (irritable bowel syndrome)   . Kidney stones 1990's  . Morbid obesity ([place])   . Osteoarthritis   . Personal history of radiation therapy   . Pneumonia    "walking"  . PONV (postoperative nausea and vomiting)   . Shingles 02/21/2011  . Shortness of breath on exertion   . Skin cancer   . Stroke [facility]) 2002   residual:  "little tingling in palm of left hand"  . UTI (lower urinary tract infection)    "I've had a few"    Patient Active Problem List   Diagnosis Date  Noted  . Closed burst fracture of lumbar vertebra ([place]) 04/03/2020  . Acute respiratory failure with hypoxia and hypercapnia ([place]) 04/03/2020  . ATN (acute tubular necrosis) ([place]) 04/03/2020  . H/O bad fall 04/03/2020  . HCAP (healthcare-associated pneumonia) 04/03/2020  . Hematochezia 05/29/2019  . Diverticulosis   . Swelling of vagina 04/16/2019  . Skin lesion 03/26/2019  . Left leg cellulitis 01/15/2019  . Healed diabetic foot ulcer 12/01/2017  . Abnormal TSH 11/15/2016  . Preventative health care 04/20/2016  . Allergic rhinitis 04/20/2016  . Dysuria 12/09/2014  . External hemorrhoids without complication 53/97/6734  . Left ear pain 07/08/2014  . Hip pain   . GIB (gastrointestinal bleeding) 06/18/2014  . UTI (lower urinary tract infection) 06/18/2014  . Abdominal pain   . Lower GI bleed   . Arthritis 02/18/2014  . Enteritis due to Clostridium difficile 10/12/2013  . CKD (chronic kidney disease) stage 3, GFR 30-59 ml/min ([name]) 10/11/2013  . Chronic diastolic CHF (congestive heart failure) ([place]) 10/11/2013  . Protein-calorie malnutrition, severe ([place]) 10/11/2013  . Hx of necrotizing fasciitis 10/10/2013  . GI bleed 10/10/2013  . AKI (acute kidney injury) ([place]) 10/10/2013  . Cough 09/10/2013  . Rectal bleeding 09/10/2013  . Acute on chronic diastolic CHF (congestive heart failure) ([place]) 08/31/2013  . Rash  and nonspecific skin eruption 08/27/2013  . Pulmonary edema 08/25/2013  . Severe sepsis with septic shock ([place]) 08/13/2013  . Necrotizing fasciitis of perineum 08/12/2013  . Acute on chronic kidney disease, stage 3 08/12/2013  . Wheezing 06/07/2013  . Wound healing, delayed 01/26/2012  . Diverticulosis of colon with hemorrhage 12/19/2011  . Acute blood loss anemia 12/19/2011  . Malignant neoplasm of upper-outer quadrant of right breast in female, estrogen receptor positive ([place]) 08/01/2011  . Breast cancer, right breast-lobular s/p lumpectomy and sentinel node biopsy  06/30/2011  . Shingles 02/21/2011  . Bile duct calculus with nonacute cholecystitis 01/21/2011  . Morbid obesity ([place]) 01/21/2011  . TREMOR 08/17/2010  . Diabetes ([place]) 01/09/2008  . PERIPHERAL NEUROPATHY 10/17/2007  . FATIGUE 10/17/2007  . COLONIC POLYPS, HX OF 02/12/2007  . Hyperlipidemia 02/10/2007  . Gout 02/10/2007  . ANXIETY 02/10/2007  . Essential hypertension 02/10/2007  . CVA 02/10/2007  . DIVERTICULOSIS, COLON 02/10/2007  . IRRITABLE BOWEL, PREDOMINANTLY CONSTIPATION 02/10/2007    Past Surgical History:  Procedure Laterality Date  . ABDOMINAL HYSTERECTOMY    . BILE DUCT STENT PLACEMENT  ~ 01/2011  . BREAST LUMPECTOMY  06/29/11   right  . BREAST LUMPECTOMY  06/29/2011   Procedure: BREAST LUMPECTOMY WITH EXCISION OF SENTINEL NODE;  Surgeon: Pedro Earls, MD;  Location: [place];  Service: General;  Laterality: Right;  right sentinel node mapping,right sentinel node biopsy, needle localization right breast lumpectomy  . CATARACT EXTRACTION W/ INTRAOCULAR LENS  IMPLANT, BILATERAL  1990's  . CHOLECYSTECTOMY  03/23/11   lap chole   . COLON SURGERY     hole in intestine ,tumors?  . colonic perforation repair  ~ 2000  . [place]  2012  . INCISION AND DRAINAGE ABSCESS N/A 08/12/2013   Procedure: INCISION AND DRAINAGE ABSCESS;  Surgeon: Earnstine Regal, MD;  Location: [facility];  Service: General;  Laterality: N/A;  . INCISION AND DRAINAGE ABSCESS N/A 08/20/2013   Procedure: INCISION AND DRAINAGE ABSCESS;  Surgeon: Ralene Ok, MD;  Location: [facility];  Service: General;  Laterality: N/A;  . INCISION AND DRAINAGE BREAST ABSCESS     right breast seroma  . IRRIGATION AND DEBRIDEMENT ABSCESS N/A 08/14/2013   Procedure: Debridment of perineal tissue and debridement of perineal wound;  Surgeon: Earnstine Regal, MD;  Location: [facility];  Service: General;  Laterality: N/A;  . IRRIGATION AND DEBRIDEMENT ABSCESS N/A 08/16/2013   Procedure: DRESSING CHANGE AND DEBRIDEMENT OF PERINEUM  WOUND;  Surgeon: Earnstine Regal, MD;  Location: [facility];  Service: General;  Laterality: N/A;  . JOINT REPLACEMENT    . MASS EXCISION Right 12/26/2018   Procedure: EXCISION RIGHT ARM SKIN MASS;  Surgeon: Erroll Luna, MD;  Location: [place];  Service: General;  Laterality: Right;  . MINOR APPLICATION OF WOUND VAC N/A 08/20/2013   Procedure: MINOR APPLICATION OF WOUND VAC;  Surgeon: Ralene Ok, MD;  Location: [facility];  Service: General;  Laterality: N/A;  . pelvic bone tumor removal     twice in 2000's  . PILONIDAL CYST DRAINAGE N/A 08/19/2013   Procedure: Dressing change perineum;  Surgeon: Ralene Ok, MD;  Location: [facility];  Service: General;  Laterality: N/A;  . REPLACEMENT TOTAL KNEE BILATERAL  ~ 2008; ~ 2010   right; left  . TONSILLECTOMY  ~ 1946  . TOTAL HIP ARTHROPLASTY  1980's   right    Prior to Admission medications   Medication Sig Start Date End Date Taking? Authorizing  Provider  acetaminophen (TYLENOL) 325 MG tablet Take 650 mg by mouth every 6 (six) hours as needed (arthritis).     [provider]  acetaZOLAMIDE (DIAMOX) 250 MG tablet Take 250 mg by mouth 3 (three) times daily.    [provider]  allopurinol (ZYLOPRIM) 100 MG tablet TAKE 2 TABLETS BY MOUTH ALTERNATING WITH 1 TABLET EVERY OTHER DAY 08/05/19   Biagio Borg, MD  allopurinol (ZYLOPRIM) 100 MG tablet Take 100 mg by mouth every other day.    [provider]  amLODipine (NORVASC) 5 MG tablet TAKE 1 TABLET BY MOUTH ONCE A DAY 01/27/20   Biagio Borg, MD  aspirin 325 MG EC tablet Take 1 tablet (325 mg total) by mouth every other day. 06/23/14   Hosie Poisson, MD  Ensure (ENSURE) Take 237 mLs by mouth 2 (two) times daily between meals.    [provider]  ipratropium-albuterol (DUONEB) 0.5-2.5 (3) MG/3ML SOLN Take 3 mLs by nebulization in the morning and at bedtime. At Morning and Bedtime.    [provider]  lisinopril (ZESTRIL) 40 MG tablet Take 40  mg by mouth daily.    [provider]  Magnesium Hydroxide (MILK OF MAGNESIA PO) Take 30 mLs by mouth daily.    [provider]  Multiple Vitamin (MULTIVITAMIN ADULT PO) Take 1 tablet by mouth daily.    [provider]    Allergies Ace inhibitors, Atorvastatin, Oxycodone-acetaminophen, Rosuvastatin, Simvastatin, Codeine, and Sulfonamide derivatives  Family History  Problem Relation Age of Onset  . Breast cancer Sister        x 2  . Prostate cancer Brother   . Stroke Mother   . Kidney failure Sister   . Diabetes Sister   . Heart disease Paternal Uncle        multiple  . Heart disease Maternal Uncle        multiple  . Colon cancer Neg Hx     Social History Social History   Tobacco Use  . Smoking status: Never Smoker  . Smokeless tobacco: Never Used  Substance Use Topics  . Alcohol use: No  . Drug use: No    Review of Systems Unable to obtain reliable ROS secondary to ams ____________________________________________   PHYSICAL EXAM:  VITAL SIGNS: ED Triage Vitals  Enc Vitals Group     BP 07/18/20 1604 (!) 122/47     Pulse Rate 07/18/20 1604 61     Resp 07/18/20 1604 18     Temp --      Temp src --      SpO2 07/18/20 1604 100 %     Weight 07/18/20 1603 216 lb 0.8 oz (98 kg)     Height 07/18/20 1603 5\' 5"  (1.651 m)   Constitutional: Awake and alert. Not responding verbally to questions. Eyes: Conjunctivae are normal.  ENT      Head: Normocephalic and atraumatic.      Nose: No congestion/rhinnorhea.      Mouth/Throat: Mucous membranes are moist.      Neck: No stridor. Hematological/Lymphatic/Immunilogical: No cervical lymphadenopathy. Cardiovascular: Normal rate, regular rhythm.  No murmurs, rubs, or gallops.  Respiratory: Normal respiratory effort without tachypnea nor retractions. Breath sounds are clear and equal bilaterally. No wheezes/rales/rhonchi. Gastrointestinal: Soft and non tender. No rebound. No guarding.  Genitourinary:  Deferred Musculoskeletal: Normal range of motion in all extremities. No lower extremity edema. Neurologic:  Awake and alert. Not responding verbally. Appears to be moving all extremities.  Skin:  Surgical wound to back with small area of dehiscence but no erythema or pus. Small wound to the left heel, with some necrotic tissue however does not appear to be acutely infected.  Psychiatric: Mood and affect are normal. Speech and behavior are normal. Patient exhibits appropriate insight and judgment.  ____________________________________________    LABS (pertinent positives/negatives)  CBC wbc 11.9, hgb 10.1, plt 320 CMP na 139, k 5.2, glu 102, cr 4.15, ca 10.9 Lactic acid 1.0 COVID negative Procalcitonin 0.51 ____________________________________________   EKG  I, Nance Pear, attending physician, personally viewed and interpreted this EKG  EKG Time: 1709 Rate: 62 Rhythm: sinus rhythm Axis: normal Intervals: qtc 435 QRS: narrow, q waves aVF, V5, V6 ST changes: no st elevation Impression: abnormal ekg  ____________________________________________    RADIOLOGY  CXR Atelectasis vs bibasilar pneumonia  ____________________________________________   PROCEDURES  Procedures  ____________________________________________   INITIAL IMPRESSION / ASSESSMENT AND PLAN / ED COURSE  Pertinent labs & imaging results that were available during my care of the patient were reviewed by me and considered in my medical decision making (see chart for details).   Patient presented to the emergency department today from living facility because of reported low blood pressure and decreased responsiveness.  Patient's blood work here is concerning for acute kidney injury.  Do think she is significantly dehydrated and she was started on IV fluids.  Additionally did have concerns for possible infection given initial chest x-ray.  Patient's urine also has concerning for signs for infection  although she does have an indwelling Foley.  Discussed findings with patient's son.  Will plan on admission to hospital service.   ____________________________________________   FINAL CLINICAL IMPRESSION(S) / ED DIAGNOSES  Final diagnoses:  Dehydration  Hypotension, unspecified hypotension type     Note: This dictation was prepared with Dragon dictation. Any transcriptional errors that result from this process are unintentional     Nance Pear, MD 07/18/20 [phone]

## 2020-07-18 NOTE — ED Notes (Signed)
Lab at bedside at this time.  

## 2020-07-18 NOTE — ED Notes (Signed)
Per lab, will send someone to come draw second set of blood cultures

## 2020-07-18 NOTE — ED Notes (Signed)
New foley bag placed at this time.

## 2020-07-18 NOTE — ED Triage Notes (Signed)
Pt arrived to ED via [facility] with c/o hypotension. Per EMS, CNA at facility stated pt BP was 96/34. EMS gave 548mL bolus, retook BP 131/48.   Upon arrival, pt BP 122/47.   Per EMS, pt has hx of dementia and diastolic CHF.   Per EMS, CNA stated pt has decreased UOP.Pt has foley cath in place, 168mL noted in drainage bag at this time.   Per EMS, pt has hx of dementia but per CNA pt is usually able to talk a little bit.

## 2020-07-18 NOTE — ED Notes (Signed)
1st set of blood cultures obtained by Elsie Amis RN at this time.

## 2020-07-18 NOTE — H&P (Incomplete)
[name]:[id number] DOB: [birth year] DOA: 07/18/2020    PCP: Biagio Borg, MD   Outpatient Specialists:   CARDS:   Dr. Haroldine Laws NS Dr. Monika Salk   Patient arrived to ER on 07/18/20 at 1542 Referred by Attending Nance Pear, MD   Patient coming from:   From facility    Chief Complaint:   Chief Complaint  Patient presents with  . Hypotension    HPI: [name] is a 87 y.o. female with medical history significant of dementia, diastolic CHF  s/p Lumbar fusion due to L3 frx and repair of traumatic CSF leak Oct 2021, anemia, atrial flutter, breast cancer status post radiation therapy, CKD stage III, history of CVA, history of DVT left leg postoperatively, is a   esophageal stricture, GERD,  C. difficile infection, left foot pressure ulcer, severe osteopenia, falls     Presented with  Hypotension initial BP at facility down to 96/34 EMS gave 500 ml bolus and BP was 131/48 Per nursing staff patient have had decreased urine output  Patient history significant for full in October 2021 resulting in L3 burst fracture requiring L2-L4 repair and spinal fusion with repair of a traumatic CSF leak.  Later on was complicated by wound dehiscence in November 2021 admit required admission to [facility] placement Thereafter she was discharged to SNF. Hospital stay was complicated by UTI in the setting of an indwelling Foley catheter positive for E. coli and staph hemolyticus she was treated with long-term IV antibiotics and had a PICC line placed as per recommendation by ID treatment for antibiotic therapy was ceftriaxone 2 g daily from 12 November to 19 June 2020  Since her discharge to SNF patient is followed up with neurosurgery She developed pressure ulcer on her left foot  Infectious risk factors:  Reports none    Has  been vaccinated against COVID    Initial COVID TEST  NEGATIVE   Lab Results  Component Value Date   [place] 07/18/2020    [place] NEGATIVE 04/22/2020   [place] NEGATIVE 05/29/2019   [place] NEGATIVE 12/22/2018    Regarding pertinent Chronic problems:     Hyperlipidemia -  not on statins Lipid Panel     Component Value Date/Time   CHOL 191 03/26/2019 1544   TRIG 232.0 (H) 03/26/2019 1544   HDL 36.00 (L) 03/26/2019 1544   CHOLHDL 5 03/26/2019 1544   VLDL 46.4 (H) 03/26/2019 1544   LDLCALC 124 (H) 06/25/2018 1637   LDLDIRECT 129.0 03/26/2019 1544     HTN on Norvasc lisinopril  chronic CHF diastolic - last echo 0254 (grade 2 diastolic  dysfunction).         On Lasix      OSA - on O2 family feels she needs a mask because she is mouth breather    obesity-   BMI Readings from Last 1 Encounters:  07/18/20 35.95 kg/m     COPD - not followed by pulmonology   not  on baseline oxygen      Hx of CVA -  With  residual deficits on Aspirin  325,     A. Flutter  -  - CHA2DS2 vas score >3  Not on anticoagulation secondary to Risk of Falls,  recurrent bleeding         -  Rate controled   CKD stage IIIa- baseline Cr 1.3 Estimated Creatinine Clearance: 10.9 mL/min (A) (by C-G formula based on SCr of 4.15 mg/dL (H)).  Lab  Results  Component Value Date   CREATININE 4.15 (H) 07/18/2020   CREATININE 1.06 (H) 04/22/2020   CREATININE 1.3 (A) 03/30/2020     Chronic anemia - baseline hg Hemoglobin & Hematocrit  Recent Labs    03/27/20 0000 04/22/20 1312 07/18/20 1554  HGB 8.5* 11.9* 10.1*    While in ER: Noted to have evidence of AKI With creatinine up to 4.1 Urine with large amount of white blood cells negative nitrates and few bacteria but budding yeast present  Blood and urine culture ordered  Covid was negative Chest x-ray showing bibasilar opacities likely atelectasis Patient was started on cefepime and Vanco   Hospitalist was called for admission for hypotension  The following Work up has been ordered so far:  Orders Placed This Encounter  Procedures  . Urine  culture  . Blood Culture (routine x 2)  . SARS Coronavirus 2 by RT PCR (hospital order, performed in [facility] hospital lab) Nasopharyngeal Nasopharyngeal Swab  . DG Chest Port 1 View  . Lactic acid, plasma  . Comprehensive metabolic panel  . CBC WITH DIFFERENTIAL  . Urinalysis, Complete w Microscopic  . Procalcitonin - Baseline  . Diet NPO time specified  . Cardiac monitoring  . Document height and weight  . Assess and Document Glasgow Coma Scale  . Refer to Sidebar Report: Sepsis Bundle ED/IP  . Initiate Carrier Fluid Protocol  . Consult to hospitalist  ALL PATIENTS BEING ADMITTED/HAVING PROCEDURES NEED COVID-19 SCREENING  . Airborne and Contact precautions  . Pulse oximetry, continuous  . ED EKG 12-Lead  . EKG 12-Lead  . Insert peripheral IV X 1     Following Medications were ordered in ER: Medications  0.9 %  sodium chloride infusion ( Intravenous New Bag/Given 07/18/20 1738)  vancomycin (VANCOCIN) IVPB 1000 mg/200 mL premix (1,000 mg Intravenous New Bag/Given 07/18/20 2108)  ceFEPIme (MAXIPIME) 2 g in sodium chloride 0.9 % 100 mL IVPB (0 g Intravenous Stopped 07/18/20 2104)        Consult Orders  (From admission, onward)         Start     Ordered   07/18/20 2219  Consult to hospitalist  ALL PATIENTS BEING ADMITTED/HAVING PROCEDURES NEED COVID-19 SCREENING  Once       Comments: ALL PATIENTS BEING ADMITTED/HAVING PROCEDURES NEED COVID-19 SCREENING  Provider:  (Not yet assigned)  Question Answer Comment  Place call to: hospitalists   Reason for Consult Admit   Diagnosis/Clinical Info for Consult: Jethro Bolus, uti      07/18/20 2218           Significant initial  Findings: Abnormal Labs Reviewed  COMPREHENSIVE METABOLIC PANEL - Abnormal; Notable for the following components:      Result Value   Potassium 5.2 (*)    Glucose, Bld 102 (*)    BUN 129 (*)    Creatinine, Ser 4.15 (*)    Calcium 10.9 (*)    Albumin 2.4 (*)    GFR, Estimated 10 (*)    All other  components within normal limits  CBC WITH DIFFERENTIAL/PLATELET - Abnormal; Notable for the following components:   WBC 11.9 (*)    RBC 3.60 (*)    Hemoglobin 10.1 (*)    HCT 32.6 (*)    RDW 18.4 (*)    Eosinophils Absolute 0.7 (*)    Abs Immature Granulocytes 0.08 (*)    All other components within normal limits  URINALYSIS, COMPLETE (UACMP) WITH MICROSCOPIC - Abnormal; Notable for the  following components:   Color, Urine YELLOW (*)    APPearance TURBID (*)    Hgb urine dipstick SMALL (*)    Protein, ur 30 (*)    Leukocytes,Ua LARGE (*)    WBC, UA >50 (*)    Bacteria, UA FEW (*)    All other components within normal limits    Otherwise labs showing:    Recent Labs  Lab 07/18/20 1554  NA 139  K 5.2*  CO2 27  GLUCOSE 102*  BUN 129*  CREATININE 4.15*  CALCIUM 10.9*    Cr    Up from baseline see below Lab Results  Component Value Date   CREATININE 4.15 (H) 07/18/2020   CREATININE 1.06 (H) 04/22/2020   CREATININE 1.3 (A) 03/30/2020    Recent Labs  Lab 07/18/20 1554  AST 24  ALT 14  ALKPHOS 83  BILITOT 0.7  PROT 7.0  ALBUMIN 2.4*   Lab Results  Component Value Date   CALCIUM 10.9 (H) 07/18/2020   PHOS 3.1 08/15/2013   WBC       Component Value Date/Time   WBC 11.9 (H) 07/18/2020 1554   LYMPHSABS 3.4 07/18/2020 1554   LYMPHSABS 2.0 04/11/2017 1308   MONOABS 1.0 07/18/2020 1554   MONOABS 0.7 04/11/2017 1308   EOSABS 0.7 (H) 07/18/2020 1554   EOSABS 0.3 04/11/2017 1308   BASOSABS 0.1 07/18/2020 1554   BASOSABS 0.1 04/11/2017 1308     Plt: Lab Results  Component Value Date   PLT 320 07/18/2020   Lactic Acid, Venous    Component Value Date/Time   LATICACIDVEN 0.9 07/18/2020 2105    Procalcitonin 0.5   COVID-19 Labs  No results for input(s): DDIMER, FERRITIN, LDH, CRP in the last 72 hours.  Lab Results  Component Value Date   SARSCOV2NAA NEGATIVE 07/18/2020   SARSCOV2NAA NEGATIVE 04/22/2020   SARSCOV2NAA NEGATIVE 05/29/2019    [place] NEGATIVE 12/22/2018     HG/HCT  stable,       Component Value Date/Time   HGB 10.1 (L) 07/18/2020 1554   HGB 12.7 04/11/2017 1308   HCT 32.6 (L) 07/18/2020 1554   HCT 39.3 04/11/2017 1308   MCV 90.6 07/18/2020 1554   MCV 93.0 04/11/2017 1308    No results for input(s): LIPASE, AMYLASE in the last 168 hours. No results for input(s): AMMONIA in the last 168 hours.    Troponin ***ordered Cardiac Panel (last 3 results) No results for input(s): CKTOTAL, CKMB, TROPONINI, RELINDX in the last 72 hours.     ECG: Ordered Personally reviewed by me showing: HR : 62 Rhythm:  NSR,    nonspecific changes, QTC*435   BNP (last 3 results) No results for input(s): BNP in the last 8760 hours.    DM  labs:  HbA1C: No results for input(s): HGBA1C in the last 8760 hours.     CBG (last 3)  No results for input(s): GLUCAP in the last 72 hours.     UA yeast, increased Leukoesterase   Urine analysis:    Component Value Date/Time   COLORURINE YELLOW (A) 07/18/2020 2026   APPEARANCEUR TURBID (A) 07/18/2020 2026   LABSPEC 1.019 07/18/2020 2026   PHURINE 5.0 07/18/2020 2026   GLUCOSEU NEGATIVE 07/18/2020 2026   GLUCOSEU NEGATIVE 05/13/2019 1349   HGBUR SMALL (A) 07/18/2020 2026   BILIRUBINUR NEGATIVE 07/18/2020 2026   BILIRUBINUR neg 12/09/2014 [place] 07/18/2020 2026   PROTEINUR 30 (A) 07/18/2020 2026   UROBILINOGEN 0.2 05/13/2019 1349  NITRITE NEGATIVE 07/18/2020 2026   LEUKOCYTESUR LARGE (A) 07/18/2020 2026      Ordered   CXR - bilateral infiltrates vs atelectasis   ED Triage Vitals  Enc Vitals Group     BP 07/18/20 1604 (!) 122/47     Pulse Rate 07/18/20 1604 61     Resp 07/18/20 1604 18     Temp --      Temp src --      SpO2 07/18/20 1604 100 %     Weight 07/18/20 1603 216 lb 0.8 oz (98 kg)     Height 07/18/20 1603 5\' 5"  (1.651 m)     Head Circumference --      Peak Flow --      Pain Score --      Pain Loc --      Pain Edu? --       Excl. in [place]? --   TMAX(24)@       Latest  Blood pressure 106/76, pulse 65, resp. rate 20, height 5\' 5"  (1.651 m), weight 98 kg, SpO2 99 %.    Review of Systems:    Pertinent positives include:   fatigue  Constitutional:  No weight loss, night sweats, Fevers, chills,, weight loss  HEENT:  No headaches, Difficulty swallowing,Tooth/dental problems,Sore throat,  No sneezing, itching, ear ache, nasal congestion, post nasal drip,  Cardio-vascular:  No chest pain, Orthopnea, PND, anasarca, dizziness, palpitations.no Bilateral lower extremity swelling  GI:  No heartburn, indigestion, abdominal pain, nausea, vomiting, diarrhea, change in bowel habits, loss of appetite, melena, blood in stool, hematemesis Resp:  no shortness of breath at rest. No dyspnea on exertion, No excess mucus, no productive cough, No non-productive cough, No coughing up of blood.No change in color of mucus.No wheezing. Skin:  no rash or lesions. No jaundice GU:  no dysuria, change in color of urine, no urgency or frequency. No straining to urinate.  No flank pain.  Musculoskeletal:  No joint pain or no joint swelling. No decreased range of motion. No back pain.  Psych:  No change in mood or affect. No depression or anxiety. No memory loss.  Neuro: no localizing neurological complaints, no tingling, no weakness, no double vision, no gait abnormality, no slurred speech, no confusion  All systems reviewed and apart from [place] all are negative  Past Medical History:   Past Medical History:  Diagnosis Date  . Anemia   . Anxiety   . Atrial flutter ([place])    "sometimes"  . Blood transfusion   . Breast cancer ([place])    right breast  . Bronchitis   . Chronic kidney disease    occ uti's  . CKD (chronic kidney disease) stage 3, GFR 30-59 ml/min ([name])   . Colon polyps    hyperplastic  . CVA (cerebral infarction)   . Diverticulosis   . DVT of leg (deep venous thrombosis) ([name])    left; S/P OR  . Dysrhythmia    dr  bensimhon   . Esophageal stricture   . GERD (gastroesophageal reflux disease)   . Gout   . Hiatal hernia    4 cm  . History of radiation therapy 02/15/12-04/02/12   right breast 4680 cGy/26 sessions, right boost=1400cGy /7 sessions  . Hyperlipidemia   . Hypertension   . IBS (irritable bowel syndrome)   . Kidney stones 1990's  . Morbid obesity ([place])   . Osteoarthritis   . Personal history of radiation therapy   . Pneumonia    "  walking"  . PONV (postoperative nausea and vomiting)   . Shingles 02/21/2011  . Shortness of breath on exertion   . Skin cancer   . Stroke [facility]) 2002   residual:  "little tingling in palm of left hand"  . UTI (lower urinary tract infection)    "I've had a few"     Past Surgical History:  Procedure Laterality Date  . ABDOMINAL HYSTERECTOMY    . BILE DUCT STENT PLACEMENT  ~ 01/2011  . BREAST LUMPECTOMY  06/29/11   right  . BREAST LUMPECTOMY  06/29/2011   Procedure: BREAST LUMPECTOMY WITH EXCISION OF SENTINEL NODE;  Surgeon: Pedro Earls, MD;  Location: [facility];  Service: General;  Laterality: Right;  right sentinel node mapping,right sentinel node biopsy, needle localization right breast lumpectomy  . CATARACT EXTRACTION W/ INTRAOCULAR LENS  IMPLANT, BILATERAL  1990's  . CHOLECYSTECTOMY  03/23/11   lap chole   . COLON SURGERY     hole in intestine ,tumors?  . colonic perforation repair  ~ 2000  . [place]  2012  . INCISION AND DRAINAGE ABSCESS N/A 08/12/2013   Procedure: INCISION AND DRAINAGE ABSCESS;  Surgeon: Earnstine Regal, MD;  Location: [facility];  Service: General;  Laterality: N/A;  . INCISION AND DRAINAGE ABSCESS N/A 08/20/2013   Procedure: INCISION AND DRAINAGE ABSCESS;  Surgeon: Ralene Ok, MD;  Location: [facility];  Service: General;  Laterality: N/A;  . INCISION AND DRAINAGE BREAST ABSCESS     right breast seroma  . IRRIGATION AND DEBRIDEMENT ABSCESS N/A 08/14/2013   Procedure: Debridment of perineal tissue and debridement of perineal  wound;  Surgeon: Earnstine Regal, MD;  Location: [facility];  Service: General;  Laterality: N/A;  . IRRIGATION AND DEBRIDEMENT ABSCESS N/A 08/16/2013   Procedure: DRESSING CHANGE AND DEBRIDEMENT OF PERINEUM WOUND;  Surgeon: Earnstine Regal, MD;  Location: [facility];  Service: General;  Laterality: N/A;  . JOINT REPLACEMENT    . MASS EXCISION Right 12/26/2018   Procedure: EXCISION RIGHT ARM SKIN MASS;  Surgeon: Erroll Luna, MD;  Location: [facility];  Service: General;  Laterality: Right;  . MINOR APPLICATION OF WOUND VAC N/A 08/20/2013   Procedure: MINOR APPLICATION OF WOUND VAC;  Surgeon: Ralene Ok, MD;  Location: [facility];  Service: General;  Laterality: N/A;  . pelvic bone tumor removal     twice in 2000's  . PILONIDAL CYST DRAINAGE N/A 08/19/2013   Procedure: Dressing change perineum;  Surgeon: Ralene Ok, MD;  Location: [facility];  Service: General;  Laterality: N/A;  . REPLACEMENT TOTAL KNEE BILATERAL  ~ 2008; ~ 2010   right; left  . TONSILLECTOMY  ~ 1946  . TOTAL HIP ARTHROPLASTY  1980's   right    Social History:  Ambulatory  bed bound     reports that she has never smoked. She has never used smokeless tobacco. She reports that she does not drink alcohol and does not use drugs.   Family History:   Family History  Problem Relation Age of Onset  . Breast cancer Sister        x 2  . Prostate cancer Brother   . Stroke Mother   . Kidney failure Sister   . Diabetes Sister   . Heart disease Paternal Uncle        multiple  . Heart disease Maternal Uncle        multiple  . Colon cancer Neg Hx    Allergies: Allergies  Allergen Reactions  . Ace Inhibitors Other (See Comments)    Tolerates lisinopril at home. Pt does not recall allergy.  . Atorvastatin     REACTION: myalgias  . Oxycodone-Acetaminophen     REACTION: confusion, fatigue  . Rosuvastatin     REACTION: leg weakness  . Simvastatin     REACTION: leg weakness  . Codeine Nausea And Vomiting and Rash   . Sulfonamide Derivatives Hives and Rash     Prior to Admission medications   Medication Sig Start Date End Date Taking? Authorizing Provider  acetaminophen (TYLENOL) 325 MG tablet Take 650 mg by mouth every 6 (six) hours as needed (arthritis).     [provider]  acetaZOLAMIDE (DIAMOX) 250 MG tablet Take 250 mg by mouth 3 (three) times daily.    [provider]  allopurinol (ZYLOPRIM) 100 MG tablet TAKE 2 TABLETS BY MOUTH ALTERNATING WITH 1 TABLET EVERY OTHER DAY 08/05/19   Biagio Borg, MD  allopurinol (ZYLOPRIM) 100 MG tablet Take 100 mg by mouth every other day.    [provider]  amLODipine (NORVASC) 5 MG tablet TAKE 1 TABLET BY MOUTH ONCE A DAY 01/27/20   Biagio Borg, MD  aspirin 325 MG EC tablet Take 1 tablet (325 mg total) by mouth every other day. 06/23/14   Hosie Poisson, MD  Ensure (ENSURE) Take 237 mLs by mouth 2 (two) times daily between meals.    [provider]  ipratropium-albuterol (DUONEB) 0.5-2.5 (3) MG/3ML SOLN Take 3 mLs by nebulization in the morning and at bedtime. At Morning and Bedtime.    [provider]  lisinopril (ZESTRIL) 40 MG tablet Take 40 mg by mouth daily.    [provider]  Magnesium Hydroxide (MILK OF MAGNESIA PO) Take 30 mLs by mouth daily.    [provider]  Multiple Vitamin (MULTIVITAMIN ADULT PO) Take 1 tablet by mouth daily.    [provider]   Physical Exam: Vitals with BMI 07/18/2020 07/18/2020 07/18/2020  Height - - -  Weight - - -  BMI - - -  Systolic 034 742 595  Diastolic 76 49 87  Pulse 65 64 75    1. General:  in No Acute distress   Chronically ill  -appearing 2. Psychological: Alert and   Oriented to self  3. Head/ENT:    Dry Mucous Membranes                          Head Non traumatic, neck supple                           Poor Dentition 4. SKIN:  decreased Skin turgor,  Skin clean Dry and intact no rash 5. Heart: Regular rate and rhythm no Murmur, no Rub or  gallop 6. Lungs:   no wheezes or crackles   7. Abdomen: Soft,  non-tender, Non distended bowel sounds present 8. Lower extremities: no clubbing, cyanosis, no ***edema 9. Neurologically Grossly intact, moving all 4 extremities equally *** strength 5 out of 5 in all 4 extremities cranial nerves II through XII intact 10. MSK: Normal range of motion  All other LABS:     Recent Labs  Lab 07/18/20 1554  WBC 11.9*  NEUTROABS 6.7  HGB 10.1*  HCT 32.6*  MCV 90.6  PLT 320     Recent Labs  Lab 07/18/20 1554  NA 139  K 5.2*  CL 99  CO2 27  GLUCOSE 102*  BUN 129*  CREATININE 4.15*  CALCIUM 10.9*     Recent Labs  Lab 07/18/20 1554  AST 24  ALT 14  ALKPHOS 83  BILITOT 0.7  PROT 7.0  ALBUMIN 2.4*   Cultures:    Component Value Date/Time   SDES  11/21/2017 1128    WOUND FOOT Performed at [facility], [place] [address] [place], [place] [address]    SPECREQUEST  11/21/2017 1128    NONE Performed at [facility], [place] [address], [place], [place] [address]    CULT  11/21/2017 [place] Performed at [facility], [place] [address] [place], [place] [address]    REPTSTATUS 11/26/2017 FINAL 11/21/2017 1128     Radiological Exams on Admission: DG Chest Port 1 View  Result Date: 07/18/2020 CLINICAL DATA:  Sepsis EXAM: PORTABLE CHEST 1 VIEW COMPARISON:  May 29th first 2019 FINDINGS: Evaluation is limited secondary to patient rotation. Similar cardiomediastinal silhouette when allowing for marked patient rotation. Small bilateral pleural effusions. Bibasilar heterogeneous predominately linear opacities. No pneumothorax. Degenerative changes of the thoracic spine. IMPRESSION: Limited evaluation secondary to patient rotation. Bibasilar opacities likely reflect atelectasis but superimposed infection could present similarly. Recommend repeat PA and lateral chest radiograph for improved  evaluation. Electronically Signed   By: Valentino Saxon MD   On: 07/18/2020 17:09    Chart has been reviewed    Assessment/Plan   87 y.o. female with medical history significant of dementia, diastolic CHF  s/p Lumbar fusion due to L3 frx and repair of traumatic CSF leak Oct 2021, anemia, atrial flutter, breast cancer status post radiation therapy, CKD stage III, history of CVA, history of DVT left leg postoperatively, is a   esophageal stricture, GERD,  C. difficile infection, left foot pressure ulcer, severe osteopenia, falls   Admitted for AKI and dehydration, DM2  Present on Admission: **None**   Other plan as per orders.  DVT prophylaxis:  SCD *** Lovenox       Code Status:    Code Status: Prior FULL CODE  as per family  I had personally discussed CODE STATUS with   Family  I had spent *min discussing goals of care and CODE STATUS    Family Communication:   Family not at  Bedside  plan of care was discussed on the phone with   Son   Disposition Plan:     Back to current facility when stable  Family would like to be placed in a different facility                           ***Following barriers for discharge:                            Electrolytes corrected                               Anemia corrected                             Pain controlled with PO medications  Afebrile, white count improving able to transition to PO antibiotics                             Will need to be able to tolerate PO                            Will likely need home health, home O2, set up                           Will need consultants to evaluate patient prior to discharge  ****EXPECT DC tomorrow                    ***Would benefit from PT/OT eval prior to DC  Ordered                   Swallow eval - SLP ordered                   Diabetes care coordinator                   Transition of care consulted                   Nutrition    consulted                   Wound care  consulted                   Palliative care    consulted                   Behavioral health  consulted                    Consults called: ***    Admission status:  ED Disposition    None      Obs    Level of care   *** tele  For 12H 24H     medical floor       SDU tele indefinitely please discontinue once patient no longer qualifies COVID-19 Labs    Lab Results  Component Value Date   [place] 07/18/2020     Precautions: admitted as   Covid Negative  Airborne and Contact precautions  t   PPE: Used by the provider:   N95  eye Goggles,  Gloves    Jacy Brocker 07/18/2020, 10:33 PM ***  [facility]     after 2 AM please page floor coverage PA If 7AM-7PM, please contact the day team taking care of the patient using [email or web address]   Patient was evaluated in the context of the global COVID-19 pandemic, which necessitated consideration that the patient might be at risk for infection with the SARS-CoV-2 virus that causes COVID-19. Institutional protocols and algorithms that pertain to the evaluation of patients at risk for COVID-19 are in a state of rapid change based on information released by regulatory bodies including the CDC and federal and state organizations. These policies and algorithms were followed during the patient's care.

## 2020-07-19 DIAGNOSIS — E860 Dehydration: Secondary | ICD-10-CM | POA: Diagnosis not present

## 2020-07-19 DIAGNOSIS — N179 Acute kidney failure, unspecified: Secondary | ICD-10-CM | POA: Diagnosis not present

## 2020-07-19 DIAGNOSIS — G9341 Metabolic encephalopathy: Secondary | ICD-10-CM | POA: Diagnosis not present

## 2020-07-19 DIAGNOSIS — J189 Pneumonia, unspecified organism: Secondary | ICD-10-CM | POA: Diagnosis not present

## 2020-07-19 LAB — PROCALCITONIN: Procalcitonin: 0.46 ng/mL

## 2020-07-19 LAB — CBC WITH DIFFERENTIAL/PLATELET
Abs Immature Granulocytes: 0.08 10*3/uL — ABNORMAL HIGH (ref 0.00–0.07)
Basophils Absolute: 0.1 10*3/uL (ref 0.0–0.1)
Basophils Relative: 1 %
Eosinophils Absolute: 0.2 10*3/uL (ref 0.0–0.5)
Eosinophils Relative: 2 %
HCT: 34 % — ABNORMAL LOW (ref 36.0–46.0)
Hemoglobin: 10 g/dL — ABNORMAL LOW (ref 12.0–15.0)
Immature Granulocytes: 1 %
Lymphocytes Relative: 12 %
Lymphs Abs: 1.1 10*3/uL (ref 0.7–4.0)
MCH: 27.2 pg (ref 26.0–34.0)
MCHC: 29.4 g/dL — ABNORMAL LOW (ref 30.0–36.0)
MCV: 92.4 fL (ref 80.0–100.0)
Monocytes Absolute: 0.6 10*3/uL (ref 0.1–1.0)
Monocytes Relative: 6 %
Neutro Abs: 7.1 10*3/uL (ref 1.7–7.7)
Neutrophils Relative %: 78 %
Platelets: 310 10*3/uL (ref 150–400)
RBC: 3.68 MIL/uL — ABNORMAL LOW (ref 3.87–5.11)
RDW: 18.2 % — ABNORMAL HIGH (ref 11.5–15.5)
WBC: 9.1 10*3/uL (ref 4.0–10.5)
nRBC: 0 % (ref 0.0–0.2)

## 2020-07-19 LAB — TSH: TSH: 5.401 u[IU]/mL — ABNORMAL HIGH (ref 0.350–4.500)

## 2020-07-19 LAB — GLUCOSE, CAPILLARY
Glucose-Capillary: 91 mg/dL (ref 70–99)
Glucose-Capillary: 76 mg/dL (ref 70–99)
Glucose-Capillary: 94 mg/dL (ref 70–99)
Glucose-Capillary: 140 mg/dL — ABNORMAL HIGH (ref 70–99)
Glucose-Capillary: 107 mg/dL — ABNORMAL HIGH (ref 70–99)
Glucose-Capillary: 100 mg/dL — ABNORMAL HIGH (ref 70–99)

## 2020-07-19 LAB — COMPREHENSIVE METABOLIC PANEL
ALT: 15 U/L (ref 0–44)
AST: 28 U/L (ref 15–41)
Albumin: 2.4 g/dL — ABNORMAL LOW (ref 3.5–5.0)
Alkaline Phosphatase: 85 U/L (ref 38–126)
Anion gap: 12 (ref 5–15)
BUN: 128 mg/dL — ABNORMAL HIGH (ref 8–23)
CO2: 25 mmol/L (ref 22–32)
Calcium: 10.8 mg/dL — ABNORMAL HIGH (ref 8.9–10.3)
Chloride: 102 mmol/L (ref 98–111)
Creatinine, Ser: 3.93 mg/dL — ABNORMAL HIGH (ref 0.44–1.00)
GFR, Estimated: 10 mL/min — ABNORMAL LOW (ref >60)
Glucose, Bld: 102 mg/dL — ABNORMAL HIGH (ref 70–99)
Potassium: 5.4 mmol/L — ABNORMAL HIGH (ref 3.5–5.1)
Sodium: 139 mmol/L (ref 135–145)
Total Bilirubin: 0.8 mg/dL (ref 0.3–1.2)
Total Protein: 6.9 g/dL (ref 6.5–8.1)

## 2020-07-19 LAB — BASIC METABOLIC PANEL
Anion gap: 12 (ref 5–15)
BUN: 129 mg/dL — ABNORMAL HIGH (ref 8–23)
CO2: 25 mmol/L (ref 22–32)
Calcium: 10.6 mg/dL — ABNORMAL HIGH (ref 8.9–10.3)
Chloride: 102 mmol/L (ref 98–111)
Creatinine, Ser: 3.94 mg/dL — ABNORMAL HIGH (ref 0.44–1.00)
GFR, Estimated: 10 mL/min — ABNORMAL LOW (ref >60)
Glucose, Bld: 130 mg/dL — ABNORMAL HIGH (ref 70–99)
Potassium: 5.3 mmol/L — ABNORMAL HIGH (ref 3.5–5.1)
Sodium: 139 mmol/L (ref 135–145)

## 2020-07-19 LAB — HEMOGLOBIN A1C
Hgb A1c MFr Bld: 5.6 % (ref 4.8–5.6)
Mean Plasma Glucose: 114.02 mg/dL

## 2020-07-19 LAB — BLOOD GAS, VENOUS
Acid-Base Excess: 2.4 mmol/L — ABNORMAL HIGH (ref 0.0–2.0)
Bicarbonate: 29.4 mmol/L — ABNORMAL HIGH (ref 20.0–28.0)
O2 Saturation: 69.9 %
Patient temperature: 37
pCO2, Ven: 57 mmHg (ref 44.0–60.0)
pH, Ven: 7.32 (ref 7.250–7.430)
pO2, Ven: 40 mmHg (ref 32.0–45.0)

## 2020-07-19 LAB — PHOSPHORUS: Phosphorus: 6.4 mg/dL — ABNORMAL HIGH (ref 2.5–4.6)

## 2020-07-19 LAB — CBG MONITORING, ED: Glucose-Capillary: 115 mg/dL — ABNORMAL HIGH (ref 70–99)

## 2020-07-19 LAB — SODIUM, URINE, RANDOM: Sodium, Ur: 40 mmol/L

## 2020-07-19 LAB — PREALBUMIN: Prealbumin: 13.8 mg/dL — ABNORMAL LOW (ref 18–38)

## 2020-07-19 LAB — MAGNESIUM
Magnesium: 2.6 mg/dL — ABNORMAL HIGH (ref 1.7–2.4)
Magnesium: 2.6 mg/dL — ABNORMAL HIGH (ref 1.7–2.4)

## 2020-07-19 LAB — CREATININE, URINE, RANDOM: Creatinine, Urine: 124 mg/dL

## 2020-07-19 LAB — CK
Total CK: 373 U/L — ABNORMAL HIGH (ref 38–234)
Total CK: 460 U/L — ABNORMAL HIGH (ref 38–234)

## 2020-07-19 LAB — MRSA PCR SCREENING: MRSA by PCR: POSITIVE — AB

## 2020-07-19 LAB — STREP PNEUMONIAE URINARY ANTIGEN: Strep Pneumo Urinary Antigen: NEGATIVE

## 2020-07-19 MED ORDER — CHLORHEXIDINE GLUCONATE CLOTH 2 % EX PADS: 6.0000 | MEDICATED_PAD | Freq: Every day | CUTANEOUS | Status: DC

## 2020-07-19 MED ORDER — SODIUM CHLORIDE 0.9 % IV SOLN: INTRAVENOUS | Status: DC

## 2020-07-19 MED ORDER — SODIUM POLYSTYRENE SULFONATE 15 GM/60ML PO SUSP
30.0000 g | Freq: Once | ORAL | Status: AC
  Administered 2020-07-19: 30 g via ORAL
  Filled 2020-07-19: qty 120

## 2020-07-19 MED ORDER — ENOXAPARIN SODIUM 30 MG/0.3ML ~~LOC~~ SOLN
30.0000 mg | SUBCUTANEOUS | Status: DC
  Administered 2020-07-19 – 2020-07-20 (×2): 30 mg via SUBCUTANEOUS
  Filled 2020-07-19 (×2): qty 0.3

## 2020-07-19 MED ORDER — CHLORHEXIDINE GLUCONATE CLOTH 2 % EX PADS
6.0000 | MEDICATED_PAD | Freq: Every day | CUTANEOUS | Status: DC
  Administered 2020-07-19 – 2020-07-21 (×3): 6 via TOPICAL

## 2020-07-19 MED ORDER — SODIUM CHLORIDE 0.9 % IV BOLUS
500.0000 mL | Freq: Once | INTRAVENOUS | Status: AC
  Administered 2020-07-19: 500 mL via INTRAVENOUS

## 2020-07-19 MED ORDER — IPRATROPIUM-ALBUTEROL 0.5-2.5 (3) MG/3ML IN SOLN
3.0000 mL | Freq: Two times a day (BID) | RESPIRATORY_TRACT | Status: DC
  Administered 2020-07-20: 3 mL via RESPIRATORY_TRACT
  Filled 2020-07-19 (×3): qty 3

## 2020-07-19 MED ORDER — MUPIROCIN 2 % EX OINT
1.0000 "application " | TOPICAL_OINTMENT | Freq: Two times a day (BID) | CUTANEOUS | Status: AC
  Administered 2020-07-19 – 2020-07-23 (×8): 1 via NASAL
  Filled 2020-07-19 (×2): qty 22

## 2020-07-19 NOTE — Evaluation (Signed)
Physical Therapy Evaluation Patient Details Name: [name] MRN: [id number] DOB: Oct 30, 1929 Today's Date: 07/19/2020   History of Present Illness  87 y.o. female with medical history significant of dementia, diastolic CHF s/p Lumbar fusion (Oct 2021) due to fall and L3 fx, anemia, atrial flutter, breast cancer status post radiation therapy, CKD stage III, history of CVA, history of DVT left leg postoperatively, is a esophageal stricture, GERD,  C. difficile infection, left foot pressure ulcer, severe osteopenia, falls.  Clinical Impression  Limited eval 2/2 pt confusion and pain with essentially all movement. She initially showed some wililngness to work with PT but it quickly became evident that even minimal mobility would be very difficult 2/2 confusion, pain with movement and inability to get pt to focus on the task at hand.  Will maintain on PT caseload and attempt to do more mobility/execises per pt tolerance/appropriateness.     Follow Up Recommendations SNF    Equipment Recommendations   (TBD at rehab)    Recommendations for Other Services       Precautions / Restrictions Precautions Precautions: Fall Restrictions Weight Bearing Restrictions: No      Mobility  Bed Mobility Overal bed mobility: Needs Assistance Bed Mobility: Supine to Sit     Supine to sit: Max assist     General bed mobility comments: PT attempted to encourage pt getting up but she was unable to give much assist and once PT attempted she called out in too much pain and we deferred further attempts at mobility.  Pt too confused to show much effort and ultimately demands to stop even this minimal activity.    Transfers                    Ambulation/Gait                Stairs            Wheelchair Mobility    Modified Rankin (Stroke Patients Only)       Balance                                             Pertinent Vitals/Pain Pain Assessment:  Faces Faces Pain Scale: Hurts whole lot Pain Location: Pt grimacing and/or calling out in pain with most any LE activity (mostly AA/PROM) R worse than L but consistently poor tolerance.    Home Living Family/patient expects to be discharged to:: Skilled nursing facility                      Prior Function Level of Independence:  (~13 months ago pt walked ~100 ft with PT, it appears she has not been able to be very active recently)               Hand Dominance        Extremity/Trunk Assessment                Communication   Communication: No difficulties  Cognition Arousal/Alertness: Awake/alert Behavior During Therapy: Restless;Anxious Overall Cognitive Status: History of cognitive impairments - at baseline                                 General Comments: Pt very confused and difficult to keep on task.  General Comments General comments (skin integrity, edema, etc.): Pt able to follow some cuing/commands inconsistently but confusion and pain make most aspects of PT exam difficult    Exercises General Exercises - Lower Extremity Ankle Circles/Pumps: PROM;AAROM;10 reps;Both (pt inconsistently able to give partial range AROM) Heel Slides: AAROM;5 reps (pt calling out in pain, R>L) Hip ABduction/ADduction: AAROM;10 reps (again pt c/o pain with most reps (even in very limited range))   Assessment/Plan    PT Assessment Patient needs continued PT services  PT Problem List Decreased strength;Decreased range of motion;Decreased activity tolerance;Decreased balance;Decreased mobility;Decreased knowledge of use of DME;Decreased safety awareness;Decreased knowledge of precautions;Pain;Decreased cognition;Cardiopulmonary status limiting activity       PT Treatment Interventions DME instruction;Gait training;Functional mobility training;Therapeutic activities;Therapeutic exercise;Balance training;Cognitive remediation;Patient/family education     PT Goals (Current goals can be found in the Care Plan section)  Acute Rehab PT Goals Patient Stated Goal: none stated PT Goal Formulation: With patient Time For Goal Achievement: 08/02/20 Potential to Achieve Goals: Fair    Frequency Min 2X/week (3 day trial to assess appropriateness)   Barriers to discharge        Co-evaluation               AM-PAC PT "6 Clicks" Mobility  Outcome Measure Help needed turning from your back to your side while in a flat bed without using bedrails?: Total Help needed moving from lying on your back to sitting on the side of a flat bed without using bedrails?: Total Help needed moving to and from a bed to a chair (including a wheelchair)?: Total Help needed standing up from a chair using your arms (e.g., wheelchair or bedside chair)?: Total Help needed to walk in hospital room?: Total Help needed climbing 3-5 steps with a railing? : Total 6 Click Score: 6    End of Session   Activity Tolerance: Patient limited by pain Patient left: with bed alarm set;with call bell/phone within reach;with nursing/sitter in room Nurse Communication: Mobility status PT Visit Diagnosis: Muscle weakness (generalized) (M62.81);Difficulty in walking, not elsewhere classified (R26.2)    Time: 8413-2440 PT Time Calculation (min) (ACUTE ONLY): 21 min   Charges:   PT Evaluation $PT Eval Low Complexity: 1 Low          Kreg Shropshire, DPT 07/19/2020, 10:34 AM

## 2020-07-19 NOTE — Progress Notes (Signed)
PROGRESS NOTE    [name]  VQM:[id number] DOB: [birth year] DOA: 07/18/2020 PCP: Biagio Borg, MD    Brief Narrative:  [name] is a 87 y.o. female with medical history significant of dementia, diastolic CHF  s/p Lumbar fusion due to L3 frx and repair of traumatic CSF leak Oct 2021, anemia, atrial flutter, breast cancer status post radiation therapy, CKD stage III, history of CVA, history of DVT left leg postoperatively, is a   esophageal stricture, GERD,  C. difficile infection, left foot pressure ulcer, severe osteopenia, falls  .Presented with  Hypotension initial BP at facility down to 96/34 EMS gave 500 ml bolus and BP was 131/48 Per nursing staff patient have had decreased urine output  2/6-confused, quiet.   Consultants:     Procedures:   Antimicrobials:   vanco and ceftriaxone    Subjective: Confused, in mittens, quiet.  Objective: Vitals:   07/19/20 0142 07/19/20 0508 07/19/20 0538 07/19/20 0735  BP: (!) 155/83 (!) 114/102 94/61   Pulse: 71 60 (!) 56 65  Resp: 18 18    Temp: 97.9 F (36.6 C) 98.1 F (36.7 C)    TempSrc: Oral     SpO2: 100% 100%  96%  Weight:      Height:        Intake/Output Summary (Last 24 hours) at 07/19/2020 0903 Last data filed at 07/19/2020 0500 Gross per 24 hour  Intake --  Output 150 ml  Net -150 ml   Filed Weights   07/18/20 1603  Weight: 98 kg    Examination:  General exam: Appears calm and comfortable  Respiratory system: Clear to auscultation. Respiratory effort normal. Cardiovascular system: S1 & S2 heard, RRR. No JVD, murmurs, rubs, gallops or clicks.  Gastrointestinal system: Abdomen is nondistended, soft and nontender Normal bowel sounds heard. Central nervous system: confused in mittens Extremities: no edema Skin: warm, dry Psychiatry:  Mood & affect appropriate in current setting.     Data Reviewed: I have personally reviewed following labs and imaging studies  CBC: Recent Labs  Lab  07/18/20 1554 07/19/20 0311  WBC 11.9* 9.1  NEUTROABS 6.7 7.1  HGB 10.1* 10.0*  HCT 32.6* 34.0*  MCV 90.6 92.4  PLT 320 932   Basic Metabolic Panel: Recent Labs  Lab 07/18/20 1554 07/18/20 2342 07/19/20 0311  NA 139 139 139  K 5.2* 5.3* 5.4*  CL 99 102 102  CO2 27 25 25   GLUCOSE 102* 130* 102*  BUN 129* 129* 128*  CREATININE 4.15* 3.94* 3.93*  CALCIUM 10.9* 10.6* 10.8*  MG  --  2.6* 2.6*  PHOS  --   --  6.4*   GFR: Estimated Creatinine Clearance: 11.5 mL/min (A) (by C-G formula based on SCr of 3.93 mg/dL (H)). Liver Function Tests: Recent Labs  Lab 07/18/20 1554 07/19/20 0311  AST 24 28  ALT 14 15  ALKPHOS 83 85  BILITOT 0.7 0.8  PROT 7.0 6.9  ALBUMIN 2.4* 2.4*   No results for input(s): LIPASE, AMYLASE in the last 168 hours. No results for input(s): AMMONIA in the last 168 hours. Coagulation Profile: No results for input(s): INR, PROTIME in the last 168 hours. Cardiac Enzymes: Recent Labs  Lab 07/18/20 2342 07/19/20 0311  CKTOTAL 373* 460*   BNP (last 3 results) No results for input(s): PROBNP in the last 8760 hours. HbA1C: Recent Labs    07/19/20 0024  HGBA1C 5.6   CBG: Recent Labs  Lab 07/19/20 0030 07/19/20 0509 07/19/20 0746  GLUCAP 115* 91 76   Lipid Profile: No results for input(s): CHOL, HDL, LDLCALC, TRIG, CHOLHDL, LDLDIRECT in the last 72 hours. Thyroid Function Tests: Recent Labs    07/19/20 0311  TSH 5.401*   Anemia Panel: No results for input(s): VITAMINB12, FOLATE, FERRITIN, TIBC, IRON, RETICCTPCT in the last 72 hours. Sepsis Labs: Recent Labs  Lab 07/18/20 1554 07/18/20 2105 07/19/20 0311  PROCALCITON 0.51  --  0.46  LATICACIDVEN 1.0 0.9  --     Recent Results (from the past 240 hour(s))  Blood Culture (routine x 2)     Status: None (Preliminary result)   Collection Time: 07/18/20  4:30 PM   Specimen: BLOOD  Result Value Ref Range Status   Specimen Description BLOOD BLOOD RIGHT FOREARM  Final   Special  Requests   Final    BOTTLES DRAWN AEROBIC AND ANAEROBIC Blood Culture adequate volume   Culture   Final    NO GROWTH < 24 HOURS Performed at [facility], [address], [place], [place] [address]    Report Status PENDING  Incomplete  SARS Coronavirus 2 by RT PCR (hospital order, performed in [facility] hospital lab) Nasopharyngeal Nasopharyngeal Swab     Status: None   Collection Time: 07/18/20  5:39 PM   Specimen: Nasopharyngeal Swab  Result Value Ref Range Status   SARS Coronavirus 2 NEGATIVE NEGATIVE Final    Comment: (NOTE) SARS-CoV-2 target nucleic acids are NOT DETECTED.  The SARS-CoV-2 RNA is generally detectable in upper and lower respiratory specimens during the acute phase of infection. The lowest concentration of SARS-CoV-2 viral copies this assay can detect is 250 copies / mL. A negative result does not preclude SARS-CoV-2 infection and should not be used as the sole basis for treatment or other patient management decisions.  A negative result may occur with improper specimen collection / handling, submission of specimen other than nasopharyngeal swab, presence of viral mutation(s) within the areas targeted by this assay, and inadequate number of viral copies (<250 copies / mL). A negative result must be combined with clinical observations, patient history, and epidemiological information.  Fact Sheet for Patients:   StrictlyIdeas.no  Fact Sheet for Healthcare Providers: BankingDealers.co.za  This test is not yet approved or  cleared by the [place] FDA and has been authorized for detection and/or diagnosis of SARS-CoV-2 by FDA under an Emergency Use Authorization (EUA).  This EUA will remain in effect (meaning this test can be used) for the duration of the COVID-19 declaration under Section 564(b)(1) of the Act, 21 U.S.C. section 360bbb-3(b)(1), unless the authorization is terminated or revoked  sooner.  Performed at [facility], [address] [place], [place] [address]   Blood Culture (routine x 2)     Status: None (Preliminary result)   Collection Time: 07/18/20  6:40 PM   Specimen: BLOOD  Result Value Ref Range Status   Specimen Description BLOOD RIGHT ANTECUBITAL  Final   Special Requests   Final    BOTTLES DRAWN AEROBIC AND ANAEROBIC Blood Culture adequate volume   Culture   Final    NO GROWTH < 24 HOURS Performed at [facility], [address] [place], [place] [address]    Report Status PENDING  Incomplete  MRSA PCR Screening     Status: Abnormal   Collection Time: 07/19/20  3:10 AM   Specimen: Nasal Mucosa; Nasopharyngeal  Result Value Ref Range Status   MRSA by PCR POSITIVE (A) NEGATIVE Final  Comment:        The GeneXpert MRSA Assay (FDA approved for NASAL specimens only), is one component of a comprehensive MRSA colonization surveillance program. It is not intended to diagnose MRSA infection nor to guide or monitor treatment for MRSA infections. RESULT CALLED TO, READ BACK BY AND VERIFIED WITH: STACY CLAY AT 0609 ON 07/19/2020 [place]. Performed at [facility], [address], [place], [place] [address]          Radiology Studies: US RENAL  Result Date: 07/19/2020 CLINICAL DATA:  Acute renal failure EXAM: RENAL / URINARY TRACT ULTRASOUND COMPLETE COMPARISON:  10/19/2017 FINDINGS: Right Kidney: Renal measurements: 10.2 x 4.8 x 5.2 cm = volume: 133 mL. Cortical thinning and increased echotexture. No mass or hydronephrosis. Left Kidney: Renal measurements: 9.7 x 4.5 x 4.2 cm = volume: 95 mL. Cortical thinning and increased echotexture. No mass or hydronephrosis. Bladder: Not visualized, decompressed with Foley catheter in place. Other: None. IMPRESSION: Cortical thinning and increased echotexture bilaterally compatible with chronic medical renal disease. No hydronephrosis. Electronically Signed   By: Rolm Baptise M.D.   On:  07/19/2020 00:16   DG Chest Port 1 View  Result Date: 07/18/2020 CLINICAL DATA:  Hypotension EXAM: PORTABLE CHEST 1 VIEW COMPARISON:  07/18/2020 FINDINGS: Patient is rotated to the right, limiting study. Bibasilar atelectasis or infiltrates, right greater than left. Findings stable since prior study IMPRESSION: No significant change since recent study. Electronically Signed   By: Rolm Baptise M.D.   On: 07/18/2020 23:38   DG Chest Port 1 View  Result Date: 07/18/2020 CLINICAL DATA:  Sepsis EXAM: PORTABLE CHEST 1 VIEW COMPARISON:  May 29th first 2019 FINDINGS: Evaluation is limited secondary to patient rotation. Similar cardiomediastinal silhouette when allowing for marked patient rotation. Small bilateral pleural effusions. Bibasilar heterogeneous predominately linear opacities. No pneumothorax. Degenerative changes of the thoracic spine. IMPRESSION: Limited evaluation secondary to patient rotation. Bibasilar opacities likely reflect atelectasis but superimposed infection could present similarly. Recommend repeat PA and lateral chest radiograph for improved evaluation. Electronically Signed   By: Valentino Saxon MD   On: 07/18/2020 17:09        Scheduled Meds: . aspirin  325 mg Oral QODAY  . Chlorhexidine Gluconate Cloth  6 each Topical Q0600  . insulin aspart  0-6 Units Subcutaneous Q4H  . ipratropium-albuterol  3 mL Nebulization BID  . mupirocin ointment  1 application Nasal BID  . sodium polystyrene  30 g Oral Once   Continuous Infusions: . sodium chloride 75 mL/hr (07/19/20 0524)  . azithromycin 500 mg (07/19/20 0355)  . cefTRIAXone (ROCEPHIN)  IV 2 g (07/19/20 0522)    Assessment & Plan:   Active Problems:   Diabetes ([place])   Hyperlipidemia   Essential hypertension   Malignant neoplasm of upper-outer quadrant of right breast in female, estrogen receptor positive ([place])   Wound healing, delayed   AKI (acute kidney injury) ([place])   CKD (chronic kidney disease) stage 3, GFR  30-59 ml/min ([name])   Chronic diastolic CHF (congestive heart failure) ([name])   Protein-calorie malnutrition, severe ([name])   Dehydration   Hyperkalemia   CAP (community acquired pneumonia)   Acute metabolic encephalopathy   Acute metabolic encephalopathy -   - most likely multifactorial secondary to combination of  infection ,dehydration , and AKI Continue ivf Continue iv abx  Neuro exam Hold sedative meds  Probable Pneumonia- cxr with findings. Wbc down Continue iv abx with ceftriaxone and azithromycin  . Wound healing, delayed -as well  as pressure ulcer on left heel Consult wound care nursing.    . Protein-calorie malnutrition, severe ([place]) - Nutrition consult   . Hyperlipidemia -chronic stable not on statin statin  . Essential hypertension -hold home medications given hypotension admission  . CKD (chronic kidney disease) stage 3, GFR 30-59 ml/min ([name]) acute on chronic renal failure likely secondary to severe dehydration. F/u renal US-neck renal medical disease no hydronephrosis  -chronic avoid nephrotoxic medications such as NSAIDs, Vanco Zosyn combo,  avoid hypotension Monitor levels  . Chronic diastolic CHF (congestive heart failure) ([name]) currently appears to be in a dry side Hold lasix  . AKI (acute kidney injury) ([place]) -appears to be prerenal-dehydration, on ACE and Lasix.   Hold lisinopril rehydrate and continue to follow RENAL US showing no evidence of hydronephrosis CK elevated continue to rehydrate and repeat in AM . Dehydration -rehydrate and follow  . Hyperkalemia -repeat after fluid resuscitation and treat as needed Monitor on telemetry hold lisinopril hold potassium    Abnormal UA -unclear if source of infection versus, dirty urine secondary to chronically indwelling Foley.  Candida present.  Await results of urine culture to see if needs to be treated   DM2-  - Order Sensitive   SSI    -  check TSH and HgA1C      DVT prophylaxis:  Lovenox Code Status: Full Family Communication: Updated husband  Status is: Inpatient  Remains inpatient appropriate because:IV treatments appropriate due to intensity of illness or inability to take PO   Dispo: The patient is from: SNF              Anticipated d/c is to: SNF              Anticipated d/c date is: > 3 days              Patient currently is not medically stable to d/c.   Difficult to place patient No            LOS: 1 day   Time spent: 35 minutes with more than 50% on [place], MD [facility] Pager [phone]  If 7PM-7AM, please contact night-coverage 07/19/2020, 9:03 AM

## 2020-07-19 NOTE — Plan of Care (Signed)
Patient is disoriented x 4 with wounds to sacrum, lower back, and left heel. Foley in place. Antibiotics and IV fluids ordered.  Will continue to monitor.  Christene Slates

## 2020-07-19 NOTE — Progress Notes (Signed)
OT Cancellation Note  Patient Details Name: ANELY SPIEWAK MRN: [id number] DOB: 30-Aug-1929   Cancelled Treatment:    Reason Eval/Treat Not Completed: Medical issues which prohibited therapy. Orders received and chart reviewed - pt noted to have K+ critically high/low at 5.4; contraindicated for exertional activity at this time. Will continue to follow and initiate services as pt medically appropriate to participate in therapy.   Fredirick Maudlin, OTR/L [place]

## 2020-07-20 ENCOUNTER — Inpatient Hospital Stay: Payer: Medicare Other

## 2020-07-20 DIAGNOSIS — J189 Pneumonia, unspecified organism: Secondary | ICD-10-CM | POA: Diagnosis not present

## 2020-07-20 DIAGNOSIS — N179 Acute kidney failure, unspecified: Secondary | ICD-10-CM | POA: Diagnosis not present

## 2020-07-20 DIAGNOSIS — G9341 Metabolic encephalopathy: Secondary | ICD-10-CM | POA: Diagnosis not present

## 2020-07-20 DIAGNOSIS — I5032 Chronic diastolic (congestive) heart failure: Secondary | ICD-10-CM | POA: Diagnosis not present

## 2020-07-20 LAB — BASIC METABOLIC PANEL
Anion gap: 14 (ref 5–15)
BUN: 111 mg/dL — ABNORMAL HIGH (ref 8–23)
CO2: 26 mmol/L (ref 22–32)
Calcium: 10.1 mg/dL (ref 8.9–10.3)
Chloride: 108 mmol/L (ref 98–111)
Creatinine, Ser: 3.01 mg/dL — ABNORMAL HIGH (ref 0.44–1.00)
GFR, Estimated: 14 mL/min — ABNORMAL LOW (ref >60)
Glucose, Bld: 100 mg/dL — ABNORMAL HIGH (ref 70–99)
Potassium: 4.5 mmol/L (ref 3.5–5.1)
Sodium: 148 mmol/L — ABNORMAL HIGH (ref 135–145)

## 2020-07-20 LAB — GLUCOSE, CAPILLARY
Glucose-Capillary: 93 mg/dL (ref 70–99)
Glucose-Capillary: 91 mg/dL (ref 70–99)
Glucose-Capillary: 105 mg/dL — ABNORMAL HIGH (ref 70–99)
Glucose-Capillary: 97 mg/dL (ref 70–99)
Glucose-Capillary: 96 mg/dL (ref 70–99)

## 2020-07-20 LAB — PROCALCITONIN: Procalcitonin: 0.38 ng/mL

## 2020-07-20 MED ORDER — SODIUM CHLORIDE 0.45 % IV SOLN: INTRAVENOUS | Status: DC

## 2020-07-20 MED ORDER — ADULT MULTIVITAMIN W/MINERALS CH
1.0000 | ORAL_TABLET | Freq: Every day | ORAL | Status: DC
  Administered 2020-07-23 – 2020-08-08 (×12): 1 via ORAL
  Filled 2020-07-20 (×17): qty 1

## 2020-07-20 MED ORDER — COLLAGENASE 250 UNIT/GM EX OINT
TOPICAL_OINTMENT | Freq: Every day | CUTANEOUS | Status: DC
  Filled 2020-07-20: qty 30

## 2020-07-20 MED ORDER — IPRATROPIUM-ALBUTEROL 0.5-2.5 (3) MG/3ML IN SOLN: 3.0000 mL | Freq: Four times a day (QID) | RESPIRATORY_TRACT | Status: DC | PRN

## 2020-07-20 MED ORDER — ENSURE ENLIVE PO LIQD
237.0000 mL | Freq: Three times a day (TID) | ORAL | Status: DC
  Administered 2020-07-21 – 2020-08-09 (×27): 237 mL via ORAL

## 2020-07-20 NOTE — Evaluation (Signed)
Occupational Therapy Evaluation Patient Details Name: KIIRA BRACH MRN: [id number] DOB: 07-30-1929 Today's Date: 07/20/2020    History of Present Illness 87 y.o. female with medical history significant of dementia, diastolic CHF s/p Lumbar fusion (Oct 2021) due to fall and L3 fx, anemia, atrial flutter, breast cancer status post radiation therapy, CKD stage III, history of CVA, history of DVT left leg postoperatively, is a esophageal stricture, GERD,  C. difficile infection, left foot pressure ulcer, severe osteopenia, falls.   Clinical Impression   Ms Leece was seen for OT evaluation this date. Pt is A&O x0 and unable to obtain PLOF/home setup this date. Pt presents to acute OT demonstrating impaired ADL performance and functional mobility 2/2 decreased activity tolerance, functional strength/ROM/balance deficits, and poor command following. Pt yelling "I'm slipping, dont let me fall" once legs have exited bed.  TOTAL A sup<>sit. MAX A improving to MIN A static sitting with BUE in supportive position gripping EOB. Pt currently requires MAX A self-drinking seated EOB - pt holds ensure in mouth then spits out. TOTAL A for perihygiene at bed level rolling L+R. Pt would benefit from skilled OT to address noted impairments and functional limitations (see below for any additional details) in order to maximize safety and independence while minimizing falls risk and caregiver burden. Upon hospital discharge, recommend STR to maximize pt safety and return to PLOF.     Follow Up Recommendations  SNF    Equipment Recommendations  Other (comment) (TBD)    Recommendations for Other Services       Precautions / Restrictions Precautions Precautions: Fall Restrictions Weight Bearing Restrictions: No      Mobility Bed Mobility Overal bed mobility: Needs Assistance Bed Mobility: Supine to Sit;Sit to Supine     Supine to sit: Max assist Sit to supine: Max assist   General bed mobility  comments: Pt yelling "I'm slipping, dont let me fall" once legs have exited bed. Improves to MIN A static sitting balance    Transfers                 General transfer comment: unsafe to attempt    Balance Overall balance assessment: Needs assistance Sitting-balance support: Bilateral upper extremity supported;Feet unsupported Sitting balance-Leahy Scale: Poor Sitting balance - Comments: MAX A improving to MIN A static sitting with BUE places in supportive position                                   ADL either performed or assessed with clinical judgement   ADL Overall ADL's : Needs assistance/impaired                                       General ADL Comments: MAX A self-drinking seated EOB - pt holds ensure in mouth then spits out. TOTAL A for perihygiene at bed level rolling L+R                  Pertinent Vitals/Pain Pain Assessment: Faces Faces Pain Scale: Hurts whole lot Pain Location: unable to state Pain Descriptors / Indicators: Grimacing;Moaning;Discomfort Pain Intervention(s): Limited activity within patient's tolerance;Repositioned     Hand Dominance     Extremity/Trunk Assessment Upper Extremity Assessment Upper Extremity Assessment: Difficult to assess due to impaired cognition (moves LUE more than RUE, grasps at EOB, and resists attempts at PROM)  Lower Extremity Assessment Lower Extremity Assessment: Difficult to assess due to impaired cognition       Communication Communication Communication: No difficulties   Cognition Arousal/Alertness: Awake/alert Behavior During Therapy: Restless;Anxious Overall Cognitive Status: No family/caregiver present to determine baseline cognitive functioning                                 General Comments: Unable to state name   General Comments       Exercises Exercises: Other exercises Other Exercises Other Exercises: Pt educated re: OT role, falls  prevention Other Exercises: LBD, sup<>sit, sitting balance/tolerance   Shoulder Instructions      Home Living Family/patient expects to be discharged to:: Skilled nursing facility                                        Prior Functioning/Environment Level of Independence:  (~13 months ago pt walked ~100 ft with PT, it appears she has not been able to be very active recently)        Comments: No family available to provide information        OT Problem List: Decreased strength      OT Treatment/Interventions: Self-care/ADL training;Therapeutic exercise;Energy conservation;DME and/or AE instruction;Therapeutic activities;Patient/family education;Balance training    OT Goals(Current goals can be found in the care plan section) Acute Rehab OT Goals Patient Stated Goal: none stated OT Goal Formulation: With patient Time For Goal Achievement: 08/03/20 Potential to Achieve Goals: Fair ADL Goals Pt Will Perform Eating: with min assist;bed level Pt Will Perform Upper Body Bathing: with mod assist;sitting Pt Will Transfer to Toilet: with max assist (rolling at bed level)  OT Frequency: Min 1X/week    AM-PAC OT "6 Clicks" Daily Activity     Outcome Measure Help from another person eating meals?: A Lot Help from another person taking care of personal grooming?: A Lot Help from another person toileting, which includes using toliet, bedpan, or urinal?: Total Help from another person bathing (including washing, rinsing, drying)?: Total Help from another person to put on and taking off regular upper body clothing?: A Lot Help from another person to put on and taking off regular lower body clothing?: Total 6 Click Score: 9   End of Session Equipment Utilized During Treatment: Oxygen  Activity Tolerance: Patient limited by pain;Treatment limited secondary to agitation Patient left: in bed;with call bell/phone within reach;with bed alarm set  OT Visit Diagnosis: Other  abnormalities of gait and mobility (R26.89);Muscle weakness (generalized) (M62.81)                Time: 6761-9509 OT Time Calculation (min): 18 min Charges:  OT General Charges $OT Visit: 1 Visit OT Evaluation $OT Eval Moderate Complexity: 1 Mod OT Treatments $Self Care/Home Management : 8-22 mins  Dessie Coma, M.S. OTR/L  07/20/20, 1:29 PM  ascom [phone]

## 2020-07-20 NOTE — Progress Notes (Signed)
PT Cancellation Note  Patient Details Name: [name] MRN: [id number] DOB: 07-May-1930   Cancelled Treatment:    Reason Eval/Treat Not Completed: Other (comment).  Chart reviewed.  Pt alert in bed upon PT arrival.  Pt did not answer any questions or initiate any movement with cueing.  Unable to get pt to participate in therapy activity.  Will re-attempt PT session at a later date/time.  Leitha Bleak, PT 07/20/20, 12:07 PM

## 2020-07-20 NOTE — NC FL2 (Signed)
[place] LEVEL OF CARE SCREENING TOOL     IDENTIFICATION  Patient Name: [name] Birthdate: [birth year] Sex: female Admission Date (Current Location): 07/18/2020  [place] and [place] Number:  Engineering geologist and Address:  [facility], [address], [place], [place] [address]      Provider Number: [id number]  Attending Physician Name and Address:  Nolberto Hanlon, MD  Relative Name and Phone Number:       Current Level of Care: Hospital Recommended Level of Care: [place] Prior Approval Number:    Date Approved/Denied:   PASRR Number: [id number]  Discharge Plan: SNF    Current Diagnoses: Patient Active Problem List   Diagnosis Date Noted  . Dehydration 07/18/2020  . Hyperkalemia 07/18/2020  . CAP (community acquired pneumonia) 07/18/2020  . Acute metabolic encephalopathy 41/66/0630  . Closed burst fracture of lumbar vertebra ([place]) 04/03/2020  . Acute respiratory failure with hypoxia and hypercapnia ([place]) 04/03/2020  . ATN (acute tubular necrosis) ([place]) 04/03/2020  . H/O bad fall 04/03/2020  . HCAP (healthcare-associated pneumonia) 04/03/2020  . Hematochezia 05/29/2019  . Diverticulosis   . Swelling of vagina 04/16/2019  . Skin lesion 03/26/2019  . Left leg cellulitis 01/15/2019  . Healed diabetic foot ulcer 12/01/2017  . Abnormal TSH 11/15/2016  . Preventative health care 04/20/2016  . Allergic rhinitis 04/20/2016  . Dysuria 12/09/2014  . External hemorrhoids without complication 16/06/930  . Left ear pain 07/08/2014  . Hip pain   . GIB (gastrointestinal bleeding) 06/18/2014  . UTI (lower urinary tract infection) 06/18/2014  . Abdominal pain   . Lower GI bleed   . Arthritis 02/18/2014  . Enteritis due to Clostridium difficile 10/12/2013  . CKD (chronic kidney disease) stage 3, GFR 30-59 ml/min ([name]) 10/11/2013  . Chronic diastolic CHF (congestive heart failure) ([place]) 10/11/2013  .  Protein-calorie malnutrition, severe ([place]) 10/11/2013  . Hx of necrotizing fasciitis 10/10/2013  . GI bleed 10/10/2013  . AKI (acute kidney injury) ([place]) 10/10/2013  . Cough 09/10/2013  . Rectal bleeding 09/10/2013  . Acute on chronic diastolic CHF (congestive heart failure) ([place]) 08/31/2013  . Rash and nonspecific skin eruption 08/27/2013  . Pulmonary edema 08/25/2013  . Severe sepsis with septic shock ([place]) 08/13/2013  . Necrotizing fasciitis of perineum 08/12/2013  . Acute on chronic kidney disease, stage 3 08/12/2013  . Wheezing 06/07/2013  . Wound healing, delayed 01/26/2012  . Diverticulosis of colon with hemorrhage 12/19/2011  . Acute blood loss anemia 12/19/2011  . Malignant neoplasm of upper-outer quadrant of right breast in female, estrogen receptor positive ([place]) 08/01/2011  . Breast cancer, right breast-lobular s/p lumpectomy and sentinel node biopsy 06/30/2011  . Shingles 02/21/2011  . Bile duct calculus with nonacute cholecystitis 01/21/2011  . Morbid obesity ([place]) 01/21/2011  . TREMOR 08/17/2010  . Diabetes ([place]) 01/09/2008  . PERIPHERAL NEUROPATHY 10/17/2007  . FATIGUE 10/17/2007  . COLONIC POLYPS, HX OF 02/12/2007  . Hyperlipidemia 02/10/2007  . Gout 02/10/2007  . ANXIETY 02/10/2007  . Essential hypertension 02/10/2007  . CVA 02/10/2007  . DIVERTICULOSIS, COLON 02/10/2007  . IRRITABLE BOWEL, PREDOMINANTLY CONSTIPATION 02/10/2007    Orientation RESPIRATION BLADDER Height & Weight      (disoriented x4)  O2 (3L [place]) Incontinent,Indwelling catheter Weight: 98 kg Height:  5\' 5"  (165.1 cm)  BEHAVIORAL SYMPTOMS/MOOD NEUROLOGICAL BOWEL NUTRITION STATUS      Incontinent Diet (NPO plan to advanced prior to discharge)  AMBULATORY STATUS COMMUNICATION OF NEEDS Skin   Total  Care   PU Stage and Appropriate Care,Skin abrasions                       Personal Care Assistance Level of Assistance  Total care           Functional Limitations Info              SPECIAL CARE FACTORS FREQUENCY  PT (By licensed PT),OT (By licensed OT)                    Contractures Contractures Info: Not present    Additional Factors Info  Code Status,Allergies Code Status Info: Full Allergies Info: Ace Inhibitors, Atorvastatin, Oxycodone-acetaminophen, Rosuvastatin, Simvastatin, Codeine, Sulfonamide Derivatives           Current Medications (07/20/2020):  This is the current hospital active medication list Current Facility-Administered Medications  Medication Dose Route Frequency Provider Last Rate Last Admin  . 0.45 % sodium chloride infusion   Intravenous Continuous Nolberto Hanlon, MD 100 mL/hr at 07/20/20 1510 Infusion Verify at 07/20/20 1510  . acetaminophen (TYLENOL) tablet 650 mg  650 mg Oral Q6H PRN Toy Baker, MD   650 mg at 07/20/20 2800   Or  . acetaminophen (TYLENOL) suppository 650 mg  650 mg Rectal Q6H PRN Doutova, Anastassia, MD      . albuterol (PROVENTIL) (2.5 MG/3ML) 0.083% nebulizer solution 2.5 mg  2.5 mg Nebulization Q2H PRN Doutova, Anastassia, MD      . aspirin EC tablet 325 mg  325 mg Oral QODAY Doutova, Anastassia, MD   325 mg at 07/19/20 1112  . azithromycin (ZITHROMAX) 500 mg in sodium chloride 0.9 % 250 mL IVPB  500 mg Intravenous Q24H Toy Baker, MD   Stopped at 07/20/20 0101  . cefTRIAXone (ROCEPHIN) 2 g in sodium chloride 0.9 % 100 mL IVPB  2 g Intravenous Q24H Toy Baker, MD   Stopped at 07/20/20 0631  . Chlorhexidine Gluconate Cloth 2 % PADS 6 each  6 each Topical Q0600 Nolberto Hanlon, MD   6 each at 07/20/20 1001  . collagenase (SANTYL) ointment   Topical Daily Nolberto Hanlon, MD      . enoxaparin (LOVENOX) injection 30 mg  30 mg Subcutaneous Q24H Nolberto Hanlon, MD   30 mg at 07/19/20 1800  . insulin aspart (novoLOG) injection 0-6 Units  0-6 Units Subcutaneous Q4H Doutova, Anastassia, MD      . ipratropium-albuterol (DUONEB) 0.5-2.5 (3) MG/3ML nebulizer solution 3 mL  3 mL Nebulization BID Nolberto Hanlon, MD   3 mL at 07/20/20 0720  . mupirocin ointment (BACTROBAN) 2 % 1 application  1 application Nasal BID Nolberto Hanlon, MD   1 application at 34/91/79 1001     Discharge Medications: Please see discharge summary for a list of discharge medications.  Relevant Imaging Results:  Relevant Lab Results:   Additional Information ss [id number]  Beverly Sessions, RN

## 2020-07-20 NOTE — Progress Notes (Signed)
PROGRESS NOTE    [name]  MVE:[id number] DOB: 1929-08-10 DOA: 07/18/2020 PCP: Biagio Borg, MD    Brief Narrative:  [name] is a 87 y.o. female with medical history significant of dementia, diastolic CHF  s/p Lumbar fusion due to L3 frx and repair of traumatic CSF leak Oct 2021, anemia, atrial flutter, breast cancer status post radiation therapy, CKD stage III, history of CVA, history of DVT left leg postoperatively, is a   esophageal stricture, GERD,  C. difficile infection, left foot pressure ulcer, severe osteopenia, falls  .Presented with  Hypotension initial BP at facility down to 96/34 EMS gave 500 ml bolus and BP was 131/48 Per nursing staff patient have had decreased urine output  2/6-confused, quiet.  2/7-sitting up in bed, still confused, mumbling something. (spoke to son, states since October she has been "mumbling" and not being her self. Prior to October (before surgery) pt apparently was active and sharp.  Consultants:     Procedures:   Antimicrobials:   azithro / ceftriaxone    Subjective: Quiet , mumbling  Objective: Vitals:   07/19/20 1705 07/19/20 2015 07/19/20 2331 07/20/20 0507  BP: 108/72 (!) 143/123 126/72 (!) 114/45  Pulse: 75 69 68 72  Resp: 14 19 18 18   Temp:  97.6 F (36.4 C) 97.8 F (36.6 C) 97.9 F (36.6 C)  TempSrc:  Oral    SpO2: 93% 100% 100% 100%  Weight:      Height:        Intake/Output Summary (Last 24 hours) at 07/20/2020 0833 Last data filed at 07/20/2020 0500 Gross per 24 hour  Intake 1826.94 ml  Output 1350 ml  Net 476.94 ml   Filed Weights   07/18/20 1603  Weight: 98 kg    Examination: Calm, nad Poor resp effort decrease bs  Regular s1/s2 no gallop Soft benign, +bs No edema Confused. .     Data Reviewed: I have personally reviewed following labs and imaging studies  CBC: Recent Labs  Lab 07/18/20 1554 07/19/20 0311  WBC 11.9* 9.1  NEUTROABS 6.7 7.1  HGB 10.1* 10.0*  HCT 32.6* 34.0*  MCV  90.6 92.4  PLT 320 283   Basic Metabolic Panel: Recent Labs  Lab 07/18/20 1554 07/18/20 2342 07/19/20 0311 07/20/20 0424  NA 139 139 139 148*  K 5.2* 5.3* 5.4* 4.5  CL 99 102 102 108  CO2 27 25 25 26   GLUCOSE 102* 130* 102* 100*  BUN 129* 129* 128* 111*  CREATININE 4.15* 3.94* 3.93* 3.01*  CALCIUM 10.9* 10.6* 10.8* 10.1  MG  --  2.6* 2.6*  --   PHOS  --   --  6.4*  --    GFR: Estimated Creatinine Clearance: 15 mL/min (A) (by C-G formula based on SCr of 3.01 mg/dL (H)). Liver Function Tests: Recent Labs  Lab 07/18/20 1554 07/19/20 0311  AST 24 28  ALT 14 15  ALKPHOS 83 85  BILITOT 0.7 0.8  PROT 7.0 6.9  ALBUMIN 2.4* 2.4*   No results for input(s): LIPASE, AMYLASE in the last 168 hours. No results for input(s): AMMONIA in the last 168 hours. Coagulation Profile: No results for input(s): INR, PROTIME in the last 168 hours. Cardiac Enzymes: Recent Labs  Lab 07/18/20 2342 07/19/20 0311  CKTOTAL 373* 460*   BNP (last 3 results) No results for input(s): PROBNP in the last 8760 hours. HbA1C: Recent Labs    07/19/20 0024  HGBA1C 5.6   CBG: Recent Labs  Lab 07/19/20 1542 07/19/20 2018 07/19/20 2329 07/20/20 0510 07/20/20 0744  GLUCAP 140* 107* 100* 93 91   Lipid Profile: No results for input(s): CHOL, HDL, LDLCALC, TRIG, CHOLHDL, LDLDIRECT in the last 72 hours. Thyroid Function Tests: Recent Labs    07/19/20 0311  TSH 5.401*   Anemia Panel: No results for input(s): VITAMINB12, FOLATE, FERRITIN, TIBC, IRON, RETICCTPCT in the last 72 hours. Sepsis Labs: Recent Labs  Lab 07/18/20 1554 07/18/20 2105 07/19/20 0311 07/20/20 0424  PROCALCITON 0.51  --  0.46 0.38  LATICACIDVEN 1.0 0.9  --   --     Recent Results (from the past 240 hour(s))  Blood Culture (routine x 2)     Status: None (Preliminary result)   Collection Time: 07/18/20  4:30 PM   Specimen: BLOOD  Result Value Ref Range Status   Specimen Description BLOOD BLOOD RIGHT FOREARM  Final    Special Requests   Final    BOTTLES DRAWN AEROBIC AND ANAEROBIC Blood Culture adequate volume   Culture   Final    NO GROWTH 2 DAYS Performed at [facility], [address], [place], [place] [address]    Report Status PENDING  Incomplete  SARS Coronavirus 2 by RT PCR (hospital order, performed in [facility] hospital lab) Nasopharyngeal Nasopharyngeal Swab     Status: None   Collection Time: 07/18/20  5:39 PM   Specimen: Nasopharyngeal Swab  Result Value Ref Range Status   SARS Coronavirus 2 NEGATIVE NEGATIVE Final    Comment: (NOTE) SARS-CoV-2 target nucleic acids are NOT DETECTED.  The SARS-CoV-2 RNA is generally detectable in upper and lower respiratory specimens during the acute phase of infection. The lowest concentration of SARS-CoV-2 viral copies this assay can detect is 250 copies / mL. A negative result does not preclude SARS-CoV-2 infection and should not be used as the sole basis for treatment or other patient management decisions.  A negative result may occur with improper specimen collection / handling, submission of specimen other than nasopharyngeal swab, presence of viral mutation(s) within the areas targeted by this assay, and inadequate number of viral copies (<250 copies / mL). A negative result must be combined with clinical observations, patient history, and epidemiological information.  Fact Sheet for Patients:   StrictlyIdeas.no  Fact Sheet for Healthcare Providers: BankingDealers.co.za  This test is not yet approved or  cleared by the [place] FDA and has been authorized for detection and/or diagnosis of SARS-CoV-2 by FDA under an Emergency Use Authorization (EUA).  This EUA will remain in effect (meaning this test can be used) for the duration of the COVID-19 declaration under Section 564(b)(1) of the Act, 21 U.S.C. section 360bbb-3(b)(1), unless the authorization is terminated or revoked  sooner.  Performed at [facility], [address] [place], [place] [address]   Blood Culture (routine x 2)     Status: None (Preliminary result)   Collection Time: 07/18/20  6:40 PM   Specimen: BLOOD  Result Value Ref Range Status   Specimen Description BLOOD RIGHT ANTECUBITAL  Final   Special Requests   Final    BOTTLES DRAWN AEROBIC AND ANAEROBIC Blood Culture adequate volume   Culture   Final    NO GROWTH 2 DAYS Performed at [facility], [address] [place], [place] [address]    Report Status PENDING  Incomplete  Urine culture     Status: None (Preliminary result)   Collection Time: 07/18/20  8:26 PM   Specimen: Urine, Random  Result Value Ref Range Status   Specimen Description   Final    URINE, RANDOM Performed at [facility], [address] [place], [place] [address]    Special Requests   Final    NONE Performed at [facility], [address], [place], [place] [address]    Culture   Final    CULTURE REINCUBATED FOR BETTER GROWTH Performed at [facility], [place] [address] [place], [place] [address]    Report Status PENDING  Incomplete  MRSA PCR Screening     Status: Abnormal   Collection Time: 07/19/20  3:10 AM   Specimen: Nasal Mucosa; Nasopharyngeal  Result Value Ref Range Status   MRSA by PCR POSITIVE (A) NEGATIVE Final    Comment:        The GeneXpert MRSA Assay (FDA approved for NASAL specimens only), is one component of a comprehensive MRSA colonization surveillance program. It is not intended to diagnose MRSA infection nor to guide or monitor treatment for MRSA infections. RESULT CALLED TO, READ BACK BY AND VERIFIED WITH: STACY CLAY AT 0609 ON 07/19/2020 [place]. Performed at [facility], [address] [place], [place] [address]          Radiology Studies: US RENAL  Result Date: 07/19/2020 CLINICAL DATA:  Acute renal failure EXAM: RENAL / URINARY TRACT ULTRASOUND COMPLETE  COMPARISON:  10/19/2017 FINDINGS: Right Kidney: Renal measurements: 10.2 x 4.8 x 5.2 cm = volume: 133 mL. Cortical thinning and increased echotexture. No mass or hydronephrosis. Left Kidney: Renal measurements: 9.7 x 4.5 x 4.2 cm = volume: 95 mL. Cortical thinning and increased echotexture. No mass or hydronephrosis. Bladder: Not visualized, decompressed with Foley catheter in place. Other: None. IMPRESSION: Cortical thinning and increased echotexture bilaterally compatible with chronic medical renal disease. No hydronephrosis. Electronically Signed   By: Rolm Baptise M.D.   On: 07/19/2020 00:16   DG Chest Port 1 View  Result Date: 07/18/2020 CLINICAL DATA:  Hypotension EXAM: PORTABLE CHEST 1 VIEW COMPARISON:  07/18/2020 FINDINGS: Patient is rotated to the right, limiting study. Bibasilar atelectasis or infiltrates, right greater than left. Findings stable since prior study IMPRESSION: No significant change since recent study. Electronically Signed   By: Rolm Baptise M.D.   On: 07/18/2020 23:38   DG Chest Port 1 View  Result Date: 07/18/2020 CLINICAL DATA:  Sepsis EXAM: PORTABLE CHEST 1 VIEW COMPARISON:  May 29th first 2019 FINDINGS: Evaluation is limited secondary to patient rotation. Similar cardiomediastinal silhouette when allowing for marked patient rotation. Small bilateral pleural effusions. Bibasilar heterogeneous predominately linear opacities. No pneumothorax. Degenerative changes of the thoracic spine. IMPRESSION: Limited evaluation secondary to patient rotation. Bibasilar opacities likely reflect atelectasis but superimposed infection could present similarly. Recommend repeat PA and lateral chest radiograph for improved evaluation. Electronically Signed   By: Valentino Saxon MD   On: 07/18/2020 17:09        Scheduled Meds: . aspirin  325 mg Oral QODAY  . Chlorhexidine Gluconate Cloth  6 each Topical Q0600  . enoxaparin (LOVENOX) injection  30 mg Subcutaneous Q24H  . insulin aspart   0-6 Units Subcutaneous Q4H  . ipratropium-albuterol  3 mL Nebulization BID  . mupirocin ointment  1 application Nasal BID   Continuous Infusions: . sodium chloride 100 mL/hr at 07/20/20 0311  . azithromycin Stopped (07/20/20 0101)  . cefTRIAXone (ROCEPHIN)  IV 2 g (07/20/20 0601)    Assessment & Plan:   Active Problems:   Diabetes ([place])  Hyperlipidemia   Essential hypertension   Malignant neoplasm of upper-outer quadrant of right breast in female, estrogen receptor positive ([place])   Wound healing, delayed   AKI (acute kidney injury) ([place])   CKD (chronic kidney disease) stage 3, GFR 30-59 ml/min ([name])   Chronic diastolic CHF (congestive heart failure) ([name])   Protein-calorie malnutrition, severe ([name])   Dehydration   Hyperkalemia   CAP (community acquired pneumonia)   Acute metabolic encephalopathy   Acute metabolic encephalopathy -   - most likely multifactorial secondary to combination of  infection ,dehydration , and AKI Little more awake today than yesterday, still confused Continue ivf Continue iv abx neurochecks Will obtain head ct to make sure nothing acutely going on   UTI- +UA and ucx with Enter faeca.  Continue ceftriaxone F/u ucx final results bcx pending.   Probable Pneumonia- cxr with findings. Leukocytosis trending down Procalcitonin decreasing Continue with IV antibiotics with ceftriaxone and azithromycin   . Wound healing, delayed -as well as pressure ulcer on left heel Wound care nursing consulted  . Protein-calorie malnutrition, severe ([place]) - Nutrition consult Swallow consulted  . Hyperlipidemia -chronic stable not on statin statin  . Essential hypertension -hold home medications given hypotension admission  . CKD (chronic kidney disease) stage 3, GFR 30-59 ml/min ([name]) acute on chronic renal failure likely secondary to severe dehydration. F/u renal US-neck renal medical disease no hydronephrosis  -chronic avoid nephrotoxic  medications such as NSAIDs, Vanco Zosyn combo,  avoid hypotension Monitor levels  . Chronic diastolic CHF (congestive heart failure) ([name]) currently appears to be in a dry side Hold lasix  . AKI (acute kidney injury) ([place]) -appears to be prerenal-dehydration, on ACE and Lasix.   Hold lisinopril rehydrate and continue to follow RENAL US showing no evidence of hydronephrosis Improving continue with IV fluids Monitor levels   . Dehydration -continue with IV fluid  . Hyperkalemia - Given Kayexalate Now level normal at 4.5 continue to monitor    DM2-  - Order Sensitive   SSI    -  check TSH and HgA1C      DVT prophylaxis: Lovenox Code Status: Full Family Communication: Updated son [name]  Status is: Inpatient  Remains inpatient appropriate because:IV treatments appropriate due to intensity of illness or inability to take PO   Dispo: The patient is from: SNF              Anticipated d/c is to: SNF              Anticipated d/c date is: > 3 days              Patient currently is not medically stable to d/c.   Difficult to place patient No            LOS: 2 days   Time spent: 35 minutes with more than 50% on [place], MD [facility] Pager [phone]  If 7PM-7AM, please contact night-coverage 07/20/2020, 8:33 AM

## 2020-07-20 NOTE — Consult Note (Signed)
WOC Nurse Consult Note: Reason for Consult: Consult requested for left heel, middle back, and sacrum Wound type: Left heel with Unstageable pressure injury; 2.5X7cm, loose eschar, small amt tan drainage. No odor or fluctuance Middle back with stage 2 pressure injury; .5X.5X.1cm, pink and dry Sacrum with extensive Unstageable pressure injury; 5X15cm, 100% tightly adhered eschar, strong foul odor, mod amt tan drainage Pressure Injury POA: Yes Dressing procedure/placement/frequency: If aggressive plan of care is desired, then recommend surgical consult for possible debridement.  Secure chat message sent to primary team to inform them of this if desired. Float heels to reduce pressure.  Foam dressing to protect middle back wound. Topical treatment orders provided for bedside nurses to perform as follows to assist with enzymatic debridement of nonviable tissue as follows: Apply Santyl to left heel and sacrum Q day, then cover with moist gauze and ABD pads and tape. Please re-consult if further assistance is needed.  Thank-you,  Julien Girt MSN, [place], [place], [place], [place]

## 2020-07-20 NOTE — TOC Initial Note (Signed)
Transition of Care [facility]) - Initial/Assessment Note    Patient Details  Name: [name] MRN: [id number] Date of Birth: 01-Feb-1930  Transition of Care [facility]) CM/SW Contact:    Beverly Sessions, RN Phone Number: 07/20/2020, 3:26 PM  Clinical Narrative:                 Patient admitted from [facility] Spoke with son [name].  He does not want patient to return back to [facility] He would like for her to go to a facility in [place], so it would be easier for the patient's spouse to visit   They were private paying at [facility].  Son states the plan is for patient to get rehab, and eventually return home with husband.  If needed they can continue for patient to stay as LTC  Son request [facility] and [facility] Extensive bed search completed.  I have reached out to [facility] and Andree Elk to request review  Expected Discharge Plan: [place] Barriers to Discharge: Continued Medical Work up   Patient Goals and CMS Choice        Expected Discharge Plan and Services Expected Discharge Plan: [place]                                              Prior Living Arrangements/Services   Lives with:: Facility Resident                   Activities of Daily Living      Permission Sought/Granted                  Emotional Assessment              Admission diagnosis:  Dehydration [E86.0] Acute renal failure (ARF) ([place]) [N17.9] AKI (acute kidney injury) ([place]) [N17.9] Hypotension, unspecified hypotension type [I95.9] Patient Active Problem List   Diagnosis Date Noted  . Dehydration 07/18/2020  . Hyperkalemia 07/18/2020  . CAP (community acquired pneumonia) 07/18/2020  . Acute metabolic encephalopathy 40/98/1191  . Closed burst fracture of lumbar vertebra ([place]) 04/03/2020  . Acute respiratory failure with hypoxia and hypercapnia ([place]) 04/03/2020  . ATN (acute tubular necrosis) ([place]) 04/03/2020  . H/O bad fall  04/03/2020  . HCAP (healthcare-associated pneumonia) 04/03/2020  . Hematochezia 05/29/2019  . Diverticulosis   . Swelling of vagina 04/16/2019  . Skin lesion 03/26/2019  . Left leg cellulitis 01/15/2019  . Healed diabetic foot ulcer 12/01/2017  . Abnormal TSH 11/15/2016  . Preventative health care 04/20/2016  . Allergic rhinitis 04/20/2016  . Dysuria 12/09/2014  . External hemorrhoids without complication 47/82/9562  . Left ear pain 07/08/2014  . Hip pain   . GIB (gastrointestinal bleeding) 06/18/2014  . UTI (lower urinary tract infection) 06/18/2014  . Abdominal pain   . Lower GI bleed   . Arthritis 02/18/2014  . Enteritis due to Clostridium difficile 10/12/2013  . CKD (chronic kidney disease) stage 3, GFR 30-59 ml/min ([name]) 10/11/2013  . Chronic diastolic CHF (congestive heart failure) ([place]) 10/11/2013  . Protein-calorie malnutrition, severe ([place]) 10/11/2013  . Hx of necrotizing fasciitis 10/10/2013  . GI bleed 10/10/2013  . AKI (acute kidney injury) ([place]) 10/10/2013  . Cough 09/10/2013  . Rectal bleeding 09/10/2013  . Acute on chronic diastolic CHF (congestive heart failure) ([place]) 08/31/2013  . Rash and nonspecific skin eruption  08/27/2013  . Pulmonary edema 08/25/2013  . Severe sepsis with septic shock ([place]) 08/13/2013  . Necrotizing fasciitis of perineum 08/12/2013  . Acute on chronic kidney disease, stage 3 08/12/2013  . Wheezing 06/07/2013  . Wound healing, delayed 01/26/2012  . Diverticulosis of colon with hemorrhage 12/19/2011  . Acute blood loss anemia 12/19/2011  . Malignant neoplasm of upper-outer quadrant of right breast in female, estrogen receptor positive ([place]) 08/01/2011  . Breast cancer, right breast-lobular s/p lumpectomy and sentinel node biopsy 06/30/2011  . Shingles 02/21/2011  . Bile duct calculus with nonacute cholecystitis 01/21/2011  . Morbid obesity ([place]) 01/21/2011  . TREMOR 08/17/2010  . Diabetes ([place]) 01/09/2008  . PERIPHERAL NEUROPATHY  10/17/2007  . FATIGUE 10/17/2007  . COLONIC POLYPS, HX OF 02/12/2007  . Hyperlipidemia 02/10/2007  . Gout 02/10/2007  . ANXIETY 02/10/2007  . Essential hypertension 02/10/2007  . CVA 02/10/2007  . DIVERTICULOSIS, COLON 02/10/2007  . IRRITABLE BOWEL, PREDOMINANTLY CONSTIPATION 02/10/2007   PCP:  Biagio Borg, MD Pharmacy:   [place], [place] [address] [place] [address] Phone: [phone] Fax: [phone]     Social Determinants of Health ([place]) Interventions    Readmission Risk Interventions Readmission Risk Prevention Plan 07/20/2020  Transportation Screening Complete  Social Work Consult for [place] Planning/Counseling Complete  Medication Review Press photographer) Complete  Some recent data might be hidden

## 2020-07-20 NOTE — Progress Notes (Signed)
Clinical/Bedside Swallow Evaluation Patient Details  Name: [name] MRN: [id number] Date of Birth: December 29, 1929  Today's Date: 07/20/2020 Time: SLP Start Time (ACUTE ONLY): 92 SLP Stop Time (ACUTE ONLY): 1020 SLP Time Calculation (min) (ACUTE ONLY): 40 min  Past Medical History:  Past Medical History:  Diagnosis Date   Anemia    Anxiety    Atrial flutter ([place])    "sometimes"   Blood transfusion    Breast cancer ([place])    right breast   Bronchitis    Chronic kidney disease    occ uti's   CKD (chronic kidney disease) stage 3, GFR 30-59 ml/min ([name])    Colon polyps    hyperplastic   CVA (cerebral infarction)    Diverticulosis    DVT of leg (deep venous thrombosis) ([place])    left; S/P OR   Dysrhythmia    dr bensimhon    Esophageal stricture    GERD (gastroesophageal reflux disease)    Gout    Hiatal hernia    4 cm   History of radiation therapy 02/15/12-04/02/12   right breast 4680 cGy/26 sessions, right boost=1400cGy /7 sessions   Hyperlipidemia    Hypertension    IBS (irritable bowel syndrome)    Kidney stones 1990's   Morbid obesity ([place])    Osteoarthritis    Personal history of radiation therapy    Pneumonia    "walking"   PONV (postoperative nausea and vomiting)    Shingles 02/21/2011   Shortness of breath on exertion    Skin cancer    Stroke ([place]) 2002   residual:  "little tingling in palm of left hand"   UTI (lower urinary tract infection)    "I've had a few"   Past Surgical History:  Past Surgical History:  Procedure Laterality Date   ABDOMINAL HYSTERECTOMY     BILE DUCT STENT PLACEMENT  ~ 01/2011   BREAST LUMPECTOMY  06/29/11   right   BREAST LUMPECTOMY  06/29/2011   Procedure: BREAST LUMPECTOMY WITH EXCISION OF SENTINEL NODE;  Surgeon: Pedro Earls, MD;  Location: [facility];  Service: General;  Laterality: Right;  right sentinel node mapping,right sentinel node biopsy, needle localization right breast lumpectomy   CATARACT EXTRACTION W/ INTRAOCULAR  LENS  IMPLANT, BILATERAL  1990's   CHOLECYSTECTOMY  03/23/11   lap chole    COLON SURGERY     hole in intestine ,tumors?   colonic perforation repair  ~ 2000   HEMORRHOID SURGERY  2012   INCISION AND DRAINAGE ABSCESS N/A 08/12/2013   Procedure: INCISION AND DRAINAGE ABSCESS;  Surgeon: Earnstine Regal, MD;  Location: [facility];  Service: General;  Laterality: N/A;   INCISION AND DRAINAGE ABSCESS N/A 08/20/2013   Procedure: INCISION AND DRAINAGE ABSCESS;  Surgeon: Ralene Ok, MD;  Location: [facility];  Service: General;  Laterality: N/A;   INCISION AND DRAINAGE BREAST ABSCESS     right breast seroma   IRRIGATION AND DEBRIDEMENT ABSCESS N/A 08/14/2013   Procedure: Debridment of perineal tissue and debridement of perineal wound;  Surgeon: Earnstine Regal, MD;  Location: [facility];  Service: General;  Laterality: N/A;   IRRIGATION AND DEBRIDEMENT ABSCESS N/A 08/16/2013   Procedure: DRESSING CHANGE AND DEBRIDEMENT OF PERINEUM WOUND;  Surgeon: Earnstine Regal, MD;  Location: [facility];  Service: General;  Laterality: N/A;   JOINT REPLACEMENT     MASS EXCISION Right 12/26/2018   Procedure: EXCISION RIGHT ARM SKIN MASS;  Surgeon: Erroll Luna, MD;  Location: [facility];  Service: General;  Laterality: Right;   MINOR APPLICATION OF WOUND VAC N/A 08/20/2013   Procedure: MINOR APPLICATION OF WOUND VAC;  Surgeon: Ralene Ok, MD;  Location: [facility];  Service: General;  Laterality: N/A;   pelvic bone tumor removal     twice in 2000's   PILONIDAL CYST DRAINAGE N/A 08/19/2013   Procedure: Dressing change perineum;  Surgeon: Ralene Ok, MD;  Location: [facility];  Service: General;  Laterality: N/A;   REPLACEMENT TOTAL KNEE BILATERAL  ~ 2008; ~ 2010   right; left   TONSILLECTOMY  ~ [place]  1980's   right   HPI:  87 y.o. female with medical history significant of dementia, diastolic CHF   s/p Lumbar fusion due to L3 frx and repair of traumatic CSF leak Oct 2021, anemia, atrial  flutter, breast cancer status post radiation therapy, CKD stage III, history of CVA, history of DVT left leg postoperatively, esophageal stricture, GERD,  C. difficile infection, left foot pressure ulcer, severe osteopenia, falls who presented with hypotension. CXR showed possible PNA.   Assessment / Plan / Recommendation Clinical Impression   Patient presents with moderate risk for aspiration due to lethargy and altered mental status. She kept her eyes closed throughout assessment with exception of opening them briefly on command. She was able to state her name but is otherwise disoriented. Initially mumbling incoherently, pt became more interactive after SLP performed oral care; oral cavity and lingual surface very dry. She accepted ice chips by teaspoon, with prolonged oral phase and intermittent mild anterior spillage, but was able to swallow these without overt signs of aspiration. She requested more "I could take some more of that ice," x2. Progressed to thin liquid trials; she did not accept via spoon or cup, even with hand over hand assist for participation or when attempted by clinician. She was able to generate sufficient intraoral pressure to retrieve 2 sips of thin via straw; the first she held briefly before initiating swallow without overt signs of aspiration. Pt complaining of pain; RN present and attempted to give Tylenol crushed in puree. Pt accepted bolus with max cues but held orally. Did not transit with max cues, and attempt of liquid wash; SLP had to suction orally. Per RN pt was able to consume some Ensure last night without overt signs of difficulty. Recommend full liquids at this time with full supervision; attempt only when alert and following commands, as mentation fluctuates. RN may attempt meds crushed in puree; monitor for oral holding and pocketing, and use oral suction as needed. Opportunities for oral care and ice chips when possible to advance swallow function. SLP will follow.      SLP Visit Diagnosis: Dysphagia, oral phase (R13.11)    Aspiration Risk  Moderate aspiration risk;Risk for inadequate nutrition/hydration    Diet Recommendation Thin liquid (full liquids)  Liquid Administration via: Cup;Straw;Spoon Medication Administration: Crushed with puree Supervision: Full supervision/cueing for compensatory strategies Compensations: Minimize environmental distractions;Slow rate;Small sips/bites;Monitor for anterior loss;Other (Comment) (monitor for pocketing/oral holding) Postural Changes: Seated upright at 90 degrees    Other  Recommendations Oral Care Recommendations: Oral care QID Other Recommendations: Have oral suction available   Follow up Recommendations Skilled Nursing facility      Frequency and Duration min 2x/week  2 weeks       Prognosis Prognosis for Safe Diet Advancement: Fair Barriers to Reach Goals: Cognitive deficits      Swallow Study   General Date  of Onset: 07/18/20 HPI: 87 y.o. female with medical history significant of dementia, diastolic CHF   s/p Lumbar fusion due to L3 frx and repair of traumatic CSF leak Oct 2021, anemia, atrial flutter, breast cancer status post radiation therapy, CKD stage III, history of CVA, history of DVT left leg postoperatively, esophageal stricture, GERD,  C. difficile infection, left foot pressure ulcer, severe osteopenia, falls who presented with hypotension. CXR showed possible PNA. Type of Study: Bedside Swallow Evaluation Previous Swallow Assessment: During prior admission in 2015, dysphagia 3/thin was recommended after prolonged intubation. Diet Prior to this Study: Regular;Thin liquids Temperature Spikes Noted: No Respiratory Status: Nasal cannula (2L) History of Recent Intubation: No Behavior/Cognition: Lethargic/Drowsy;Confused;Requires cueing;Distractible Oral Cavity Assessment: Dry Oral Care Completed by SLP: Yes Oral Cavity - Dentition: Edentulous Vision:  (keeps eyes closed; needs  total assist) Self-Feeding Abilities: Total assist Patient Positioning: Upright in bed Baseline Vocal Quality: Low vocal intensity Volitional Cough: Cognitively unable to elicit Volitional Swallow: Unable to elicit    Oral/Motor/Sensory Function Overall Oral Motor/Sensory Function: Generalized oral weakness   Ice Chips Ice chips: Impaired Presentation: Spoon Oral Phase Impairments: Reduced labial seal;Reduced lingual movement/coordination Oral Phase Functional Implications: Left anterior spillage;Prolonged oral transit Pharyngeal Phase Impairments: Other (comments) (no signs of aspiration observed)   Thin Liquid Thin Liquid: Impaired Presentation: Spoon;Straw;Cup (refused spoon and cup) Oral Phase Impairments: Poor awareness of bolus Oral Phase Functional Implications: Prolonged oral transit;Oral holding;Right anterior spillage;Left anterior spillage Pharyngeal  Phase Impairments: Other (comments) (none observed)    Nectar Thick Nectar Thick Liquid: Not tested   Honey Thick Honey Thick Liquid: Not tested   Puree Puree: Impaired Presentation: Spoon Oral Phase Impairments: Reduced lingual movement/coordination;Poor awareness of bolus Oral Phase Functional Implications: Oral holding (pushes forward out of oral cavity with tongue; holds some of bolus on tongue. did not transit with liquids) Pharyngeal Phase Impairments: Other (comments) (not observed with puree; bolus was removed by suction)   Solid    Deneise Lever, MS, CCC-SLP Speech-Language Pathologist  Solid: Not tested      Aliene Altes 07/20/2020,10:42 AM

## 2020-07-20 NOTE — Progress Notes (Signed)
Initial Nutrition Assessment  DOCUMENTATION CODES:   Obesity unspecified  INTERVENTION:   -Ensure Enlive po TID, each supplement provides 350 kcal and 20 grams of protein -MVI with minerals daily -Magic cup TID with meals, each supplement provides 290 kcal and 9 grams of protein -Feeding assistance with meals  NUTRITION DIAGNOSIS:   Increased nutrient needs related to wound healing as evidenced by estimated needs.  GOAL:   Patient will meet greater than or equal to 90% of their needs  MONITOR:   PO intake,Supplement acceptance,Diet advancement,Labs,Weight trends,Skin,I & O's  REASON FOR ASSESSMENT:   Consult Assessment of nutrition requirement/status  ASSESSMENT:   87 y.o. female with medical history significant of dementia, diastolic CHF   s/p Lumbar fusion due to L3 frx and repair of traumatic CSF leak Oct 2021, anemia, atrial flutter, breast cancer status post radiation therapy, CKD stage III, history of CVA, history of DVT left leg postoperatively, is a   esophageal stricture, GERD,  C. difficile infection, left foot pressure ulcer, severe osteopenia, falls   Admitted for AKI and dehydration, DM2  Pt admitted with acute metabolic encephalopathy.   2/7- s/p BSE- advanced to full liquid diet  Pt unavailable at time of attempted visit.   Pt with poor oral intake. Noted meal completions 100%.   Reviewed wt hx; wt has been stable over the past 6 months.   Medications reviewed and include 0.45% sodium chloride infusion @ 100 ml/hr.   Pt with poor oral intake and would benefit from nutrient dense supplement. One Ensure Enlive supplement provides 350 kcals, 20 grams protein, and 44-45 grams of carbohydrate vs one Glucerna shake supplement, which provides 220 kcals, 10 grams of protein, and 26 grams of carbohydrate. Given pt's hx of DM, RD will reassess adequacy of PO intake, CBGS, and adjust supplement regimen as appropriate at follow-up.   Labs reviewed: Na: 148, CBGS:  91-105 (inpatient orders for glycemic control are 0-6 units insulin aspart every 4 hours).   Diet Order:   Diet Order            Diet full liquid Room service appropriate? Yes with Assist; Fluid consistency: Thin  Diet effective now                 EDUCATION NEEDS:   No education needs have been identified at this time  Skin:  Skin Assessment: Skin Integrity Issues: Skin Integrity Issues:: Unstageable,Stage II Stage II: lumbar Unstageable: sacrum, lt heel  Last BM:  Unknown  Height:   Ht Readings from Last 1 Encounters:  07/18/20 5\' 5"  (1.651 m)    Weight:   Wt Readings from Last 1 Encounters:  07/18/20 98 kg    Ideal Body Weight:  56.8 kg  BMI:  Body mass index is 35.95 kg/m.  Estimated Nutritional Needs:   Kcal:  1800-2000  Protein:  100-115 grams  Fluid:  > 1.8 L    Loistine Chance, RD, LDN, [place] Registered Dietitian II Certified Diabetes Care and Education Specialist Please refer to [facility] for RD and/or RD on-call/weekend/after hours pager

## 2020-07-21 DIAGNOSIS — N390 Urinary tract infection, site not specified: Secondary | ICD-10-CM

## 2020-07-21 DIAGNOSIS — E860 Dehydration: Secondary | ICD-10-CM | POA: Diagnosis not present

## 2020-07-21 DIAGNOSIS — I959 Hypotension, unspecified: Secondary | ICD-10-CM | POA: Diagnosis not present

## 2020-07-21 DIAGNOSIS — L89150 Pressure ulcer of sacral region, unstageable: Secondary | ICD-10-CM | POA: Diagnosis not present

## 2020-07-21 DIAGNOSIS — Z8619 Personal history of other infectious and parasitic diseases: Secondary | ICD-10-CM

## 2020-07-21 DIAGNOSIS — J189 Pneumonia, unspecified organism: Secondary | ICD-10-CM | POA: Diagnosis not present

## 2020-07-21 DIAGNOSIS — I5032 Chronic diastolic (congestive) heart failure: Secondary | ICD-10-CM | POA: Diagnosis not present

## 2020-07-21 DIAGNOSIS — N179 Acute kidney failure, unspecified: Secondary | ICD-10-CM | POA: Diagnosis not present

## 2020-07-21 DIAGNOSIS — G9341 Metabolic encephalopathy: Secondary | ICD-10-CM | POA: Diagnosis not present

## 2020-07-21 DIAGNOSIS — R4182 Altered mental status, unspecified: Secondary | ICD-10-CM | POA: Diagnosis not present

## 2020-07-21 LAB — GLUCOSE, CAPILLARY
Glucose-Capillary: 100 mg/dL — ABNORMAL HIGH (ref 70–99)
Glucose-Capillary: 116 mg/dL — ABNORMAL HIGH (ref 70–99)
Glucose-Capillary: 122 mg/dL — ABNORMAL HIGH (ref 70–99)
Glucose-Capillary: 80 mg/dL (ref 70–99)
Glucose-Capillary: 84 mg/dL (ref 70–99)
Glucose-Capillary: 91 mg/dL (ref 70–99)

## 2020-07-21 LAB — BASIC METABOLIC PANEL
Anion gap: 13 (ref 5–15)
BUN: 95 mg/dL — ABNORMAL HIGH (ref 8–23)
CO2: 24 mmol/L (ref 22–32)
Calcium: 9.6 mg/dL (ref 8.9–10.3)
Chloride: 110 mmol/L (ref 98–111)
Creatinine, Ser: 2.44 mg/dL — ABNORMAL HIGH (ref 0.44–1.00)
GFR, Estimated: 19 mL/min — ABNORMAL LOW (ref >60)
Glucose, Bld: 89 mg/dL (ref 70–99)
Potassium: 3.9 mmol/L (ref 3.5–5.1)
Sodium: 147 mmol/L — ABNORMAL HIGH (ref 135–145)

## 2020-07-21 LAB — LEGIONELLA PNEUMOPHILA SEROGP 1 UR AG: L. pneumophila Serogp 1 Ur Ag: NEGATIVE

## 2020-07-21 LAB — URINE CULTURE: Culture: >=100000 — AB

## 2020-07-21 MED ORDER — SODIUM CHLORIDE 0.9 % IV SOLN
3.0000 g | Freq: Two times a day (BID) | INTRAVENOUS | Status: DC
  Administered 2020-07-21 – 2020-07-25 (×8): 3 g via INTRAVENOUS
  Filled 2020-07-21: qty 3
  Filled 2020-07-21: qty 2.14
  Filled 2020-07-21 (×2): qty 8
  Filled 2020-07-21: qty 3
  Filled 2020-07-21: qty 8
  Filled 2020-07-21: qty 3
  Filled 2020-07-21 (×2): qty 8

## 2020-07-21 NOTE — Plan of Care (Signed)
PMT note:  Consult noted. Patient is resting in bed. She briefly opens eyes and closes them again upon speaking to her and touching her arm. No family at bedside. Called husband. Husband states he is very hard of hearing and he would like his son [name] to be called on his behalf. Will need to follow up with [name].

## 2020-07-21 NOTE — Consult Note (Signed)
Neurosurgery-New Consultation Evaluation 07/21/2020 [name] [id number]  Identifying Statement: [name] is a 87 y.o. female from [place] [id number] with intraventricular hemorrhage  Physician Requesting Consultation: Nolberto Hanlon, MD  History of Present Illness: [name] is admitted to the hospital after having been seen to have a an episode of hypotension.  There is also concern for decreased urine output.  She does have a history of multiple comorbidities including chronic kidney disease, CVA, heart failure, dementia.  As part of her work-up, a CT scan of the head was obtained which did reveal some small amount of blood in the posterior portion of the ventricles bilaterally.  There is no reported trauma.  She does have a recent history of a lumbar fusion for burst fracture a few months ago.  Past Medical History:  Past Medical History:  Diagnosis Date  . Anemia   . Anxiety   . Atrial flutter ([place])    "sometimes"  . Blood transfusion   . Breast cancer ([place])    right breast  . Bronchitis   . Chronic kidney disease    occ uti's  . CKD (chronic kidney disease) stage 3, GFR 30-59 ml/min ([name])   . Colon polyps    hyperplastic  . CVA (cerebral infarction)   . Diverticulosis   . DVT of leg (deep venous thrombosis) ([name])    left; S/P OR  . Dysrhythmia    dr bensimhon   . Esophageal stricture   . GERD (gastroesophageal reflux disease)   . Gout   . Hiatal hernia    4 cm  . History of radiation therapy 02/15/12-04/02/12   right breast 4680 cGy/26 sessions, right boost=1400cGy /7 sessions  . Hyperlipidemia   . Hypertension   . IBS (irritable bowel syndrome)   . Kidney stones 1990's  . Morbid obesity ([place])   . Osteoarthritis   . Personal history of radiation therapy   . Pneumonia    "walking"  . PONV (postoperative nausea and vomiting)   . Shingles 02/21/2011  . Shortness of breath on exertion   . Skin cancer   . Stroke [facility]) 2002   residual:  "little  tingling in palm of left hand"  . UTI (lower urinary tract infection)    "I've had a few"    Social History: Social History   Socioeconomic History  . Marital status: Married    Spouse name: Not on file  . Number of children: Not on file  . Years of education: Not on file  . Highest education level: Not on file  Occupational History  . Occupation: hairdresser  Tobacco Use  . Smoking status: Never Smoker  . Smokeless tobacco: Never Used  Substance and Sexual Activity  . Alcohol use: No  . Drug use: No  . Sexual activity: Never  Other Topics Concern  . Not on file  Social History Narrative   Lives at home with her husband.  Uses a walker for ambulation   Social Determinants of Health   Financial Resource Strain: Not on file  Food Insecurity: Not on file  Transportation Needs: Not on file  Physical Activity: Not on file  Stress: Not on file  Social Connections: Not on file  Intimate Partner Violence: Not on file   Living arrangements (living alone, with partner): Arrived from a nursing facility  Family History: Family History  Problem Relation Age of Onset  . Breast cancer Sister        x 2  . Prostate  cancer Brother   . Stroke Mother   . Kidney failure Sister   . Diabetes Sister   . Heart disease Paternal Uncle        multiple  . Heart disease Maternal Uncle        multiple  . Colon cancer Neg Hx     Review of Systems:  Review of Systems - General ROS: Negative Psychological ROS: Negative Ophthalmic ROS: Negative ENT ROS: Negative Hematological and Lymphatic ROS: Negative  Endocrine ROS: Negative Respiratory ROS: Negative Cardiovascular ROS: Negative Gastrointestinal ROS: Negative Genito-Urinary ROS: Negative Musculoskeletal ROS: Negative Neurological ROS: Negative for pain Dermatological ROS: Negative  Physical Exam: BP 137/61 (BP Location: Right Arm)   Pulse 89   Temp 98.5 F (36.9 C) (Oral)   Resp 18   Ht 5\' 5"  (1.651 m)   Wt 98 kg    SpO2 99%   BMI 35.95 kg/m  Body mass index is 35.95 kg/m. Body surface area is 2.12 meters squared. General appearance: Cooperative, awakenend on arrival Head: Normocephalic, atraumatic Eyes: Normal, EOM intact Oropharynx: Appears dry Ext: Lower extremities are pressure boots on  Neurologic exam:  Mental status: alertness: alert, orientation: She knew her name and year, did not know location affect: normal Speech: fluent and clear, able to name 4 out of 4 objects Cranial nerves:  II: Visual fields are full by confrontation, no ptosis III/IV/VI: extra-ocular motions intact bilaterally V/VII:no evidence of facial droop or weakness VIII: hearing normal Motor:strength appears 4+ out of 5 throughout bilateral upper extremities and appropriate for age.  In her lower extremities, she wiggles toes bilaterally and does provide resistance with hip flexion but otherwise does not participate in the exam Sensory: intact to light touch in all extremities Gait: Not tested  Imaging: CT head:Mildly motion degraded examination. Trace hyperdensity within the lateral ventricle occipital horns bilaterally, likely reflecting acute intraventricular hemorrhage. Mild generalized atrophy of the brain with chronic small vessel ischemic disease.   Impression/Plan:  [name] is here for evaluation of hypotension and management of her comorbidities and found to have a small amount of intraventricular hemorrhage on the CT of her head.  I am unsure of the etiology without a traumatic event.  Can recommend a CTA of the head to rule out any concern for vascular abnormality.  Otherwise, no further intervention or testing is needed.  Patient is clear for DVT prophylaxis as needed.   1.  Diagnosis: Small amount intraventricular hemorrhage  2.  Plan -Recommend CTA of the head for repeat scan.  If no vascular abnormality such as an aneurysm, patient is cleared from neurosurgery perspective for discharge.

## 2020-07-21 NOTE — Consult Note (Addendum)
NAME: [name]  DOB: [birth year]  MRN: [id number]  Date/Time: 07/21/2020 3:32 PM  REQUESTING PROVIDER: Kurtis Bushman Subjective:  REASON FOR CONSULT: UTI ?No history available from patient.  Chart reviewed. [name] is a 87 y.o. female with multiple comorbidities presented from the nursing home with hypotension . Per nursing staff patient had decreased urine output.  BP was 96/34 and they gave some saline bolus and which increase the BP to 131/48.Marland Kitchen  She was then sent to the hospital.   Patient has a complicated medical history On 03/19/2019 she was taken to [facility] after a fall and sustaining injury to her back.  She had an L3vertebral fracture and retroperitoneal hematoma.  She underwent L2-L4 laminectomy and posterior l, acute blood loss anemia requiring blood transfusion, hypotension requiring pressors was of diuresis ateral stabilization of L1-L5 on 03/20/2020.  Traumatic CSF leak was repaired.  During that hospitalization she was also treated for with vancomycin cefepime for 7 days for pneumonia.  She was also in AKI and nephrology was consulted.  She was in ICU for acute hypoxic hypercarbic respiratory failure requiring BiPAP as well .  Patient improved and was discharged to SNF. She was readmitted to [facility] on 04/22/2020 with dehiscence of lumbar surgical wound and with infection.  Wound cultures taken showed E. coli and staph hemolyticus.  She also had E. coli UTI because of an indwelling Foley catheter.  On 04/24/2020 she underwent debridement and wound VAC placement.  This was followed by delayed primary closure with incisional wound VAC placement on 04/27/2020.  ID saw the patient and recommended 8 to 12 weeks of ceftriaxone with possible transition to p.o. antibiotics after 05/22/2020.  She was seen by ID as outpatient on 05/15/2020 and ceftriaxone was stopped and she was switched to p.o. ciprofloxacin to complete the duration of therapy to target the E. coli and Proteus  mirabilis.  The end date was 06/19/2020.  She was seen in the neurosurgery's office on 06/16/2020 and during that visit there was some bruising to her left wrist and a decubitus ulcer on the left foot.  The lumbar spine wound seemed be healing slowly..  In the ED on 07/19/2019 BP 100/47, heart rate 61, respiratory rate 18, temperature 97.7, pulse ox 100%. Creatinine was 4.15, potassium was 5.2, WBC 11.9, hemoglobin 10.1, platelet 320.  Lactate was 1 and procalcitonin was 0.51. Patient had a Foley catheter on arrival to the ED.  UA from the Foley catheter showed WBC of more than 50. Patient was noted to have an unstageable sacral decubitus.  Blood cultures were sent. Chest x-ray showed bilateral basilar atelectasis versus infiltrate. Patient was started on vancomycin and cefepime. As patient had altered mental status a CT head done on 07/20/2020 showed hypodensity within the lateral ventricle occipital horns bilaterally likely reflecting acute intraventricular hemorrhage.  She was seen by neurosurgeon who did not think she needed any acute intervention. I'm asked to see the patient because the urine culture was positive for Enterococcus faecium.    Past Medical History:  Diagnosis Date  . Anemia   . Anxiety   . Atrial flutter ([place])    "sometimes"  . Blood transfusion   . Breast cancer ([place])    right breast  . Bronchitis   . Chronic kidney disease    occ uti's  . CKD (chronic kidney disease) stage 3, GFR 30-59 ml/min ([name])   . Colon polyps    hyperplastic  . CVA (cerebral infarction)   .  Diverticulosis   . DVT of leg (deep venous thrombosis) ([name])    left; S/P OR  . Dysrhythmia    dr bensimhon   . Esophageal stricture   . GERD (gastroesophageal reflux disease)   . Gout   . Hiatal hernia    4 cm  . History of radiation therapy 02/15/12-04/02/12   right breast 4680 cGy/26 sessions, right boost=1400cGy /7 sessions  . Hyperlipidemia   . Hypertension   . IBS (irritable bowel syndrome)   .  Kidney stones 1990's  . Morbid obesity ([place])   . Osteoarthritis   . Personal history of radiation therapy   . Pneumonia    "walking"  . PONV (postoperative nausea and vomiting)   . Shingles 02/21/2011  . Shortness of breath on exertion   . Skin cancer   . Stroke [facility]) 2002   residual:  "little tingling in palm of left hand"  . UTI (lower urinary tract infection)    "I've had a few"    Past Surgical History:  Procedure Laterality Date  . ABDOMINAL HYSTERECTOMY    . BILE DUCT STENT PLACEMENT  ~ 01/2011  . BREAST LUMPECTOMY  06/29/11   right  . BREAST LUMPECTOMY  06/29/2011   Procedure: BREAST LUMPECTOMY WITH EXCISION OF SENTINEL NODE;  Surgeon: Pedro Earls, MD;  Location: [facility];  Service: General;  Laterality: Right;  right sentinel node mapping,right sentinel node biopsy, needle localization right breast lumpectomy  . CATARACT EXTRACTION W/ INTRAOCULAR LENS  IMPLANT, BILATERAL  1990's  . CHOLECYSTECTOMY  03/23/11   lap chole   . COLON SURGERY     hole in intestine ,tumors?  . colonic perforation repair  ~ 2000  . [place]  2012  . INCISION AND DRAINAGE ABSCESS N/A 08/12/2013   Procedure: INCISION AND DRAINAGE ABSCESS;  Surgeon: Earnstine Regal, MD;  Location: [facility];  Service: General;  Laterality: N/A;  . INCISION AND DRAINAGE ABSCESS N/A 08/20/2013   Procedure: INCISION AND DRAINAGE ABSCESS;  Surgeon: Ralene Ok, MD;  Location: [facility];  Service: General;  Laterality: N/A;  . INCISION AND DRAINAGE BREAST ABSCESS     right breast seroma  . IRRIGATION AND DEBRIDEMENT ABSCESS N/A 08/14/2013   Procedure: Debridment of perineal tissue and debridement of perineal wound;  Surgeon: Earnstine Regal, MD;  Location: [facility];  Service: General;  Laterality: N/A;  . IRRIGATION AND DEBRIDEMENT ABSCESS N/A 08/16/2013   Procedure: DRESSING CHANGE AND DEBRIDEMENT OF PERINEUM WOUND;  Surgeon: Earnstine Regal, MD;  Location: [facility];  Service: General;  Laterality: N/A;  . JOINT REPLACEMENT     . MASS EXCISION Right 12/26/2018   Procedure: EXCISION RIGHT ARM SKIN MASS;  Surgeon: Erroll Luna, MD;  Location: [facility];  Service: General;  Laterality: Right;  . MINOR APPLICATION OF WOUND VAC N/A 08/20/2013   Procedure: MINOR APPLICATION OF WOUND VAC;  Surgeon: Ralene Ok, MD;  Location: [facility];  Service: General;  Laterality: N/A;  . pelvic bone tumor removal     twice in 2000's  . PILONIDAL CYST DRAINAGE N/A 08/19/2013   Procedure: Dressing change perineum;  Surgeon: Ralene Ok, MD;  Location: [facility];  Service: General;  Laterality: N/A;  . REPLACEMENT TOTAL KNEE BILATERAL  ~ 2008; ~ 2010   right; left  . TONSILLECTOMY  ~ 1946  . TOTAL HIP ARTHROPLASTY  1980's   right    Social History   Socioeconomic History  . Marital status: Married  Spouse name: Not on file  . Number of children: Not on file  . Years of education: Not on file  . Highest education level: Not on file  Occupational History  . Occupation: hairdresser  Tobacco Use  . Smoking status: Never Smoker  . Smokeless tobacco: Never Used  Substance and Sexual Activity  . Alcohol use: No  . Drug use: No  . Sexual activity: Never  Other Topics Concern  . Not on file  Social History Narrative   Lives at home with her husband.  Uses a walker for ambulation   Social Determinants of Health   Financial Resource Strain: Not on file  Food Insecurity: Not on file  Transportation Needs: Not on file  Physical Activity: Not on file  Stress: Not on file  Social Connections: Not on file  Intimate Partner Violence: Not on file    Family History  Problem Relation Age of Onset  . Breast cancer Sister        x 2  . Prostate cancer Brother   . Stroke Mother   . Kidney failure Sister   . Diabetes Sister   . Heart disease Paternal Uncle        multiple  . Heart disease Maternal Uncle        multiple  . Colon cancer Neg Hx    Allergies  Allergen Reactions  . Ace Inhibitors Other (See  Comments)    Tolerates lisinopril at home. Pt does not recall allergy.  . Atorvastatin     REACTION: myalgias  . Oxycodone-Acetaminophen     REACTION: confusion, fatigue  . Rosuvastatin     REACTION: leg weakness  . Simvastatin     REACTION: leg weakness  . Codeine Nausea And Vomiting and Rash  . Sulfonamide Derivatives Hives and Rash   ? Current Facility-Administered Medications  Medication Dose Route Frequency Provider Last Rate Last Admin  . 0.45 % sodium chloride infusion   Intravenous Continuous Nolberto Hanlon, MD 75 mL/hr at 07/21/20 1232 Rate Change at 07/21/20 1232  . acetaminophen (TYLENOL) tablet 650 mg  650 mg Oral Q6H PRN Toy Baker, MD   650 mg at 07/20/20 1610   Or  . acetaminophen (TYLENOL) suppository 650 mg  650 mg Rectal Q6H PRN Doutova, Anastassia, MD      . albuterol (PROVENTIL) (2.5 MG/3ML) 0.083% nebulizer solution 2.5 mg  2.5 mg Nebulization Q2H PRN Doutova, Anastassia, MD      . Ampicillin-Sulbactam (UNASYN) 3 g in sodium chloride 0.9 % 100 mL IVPB  3 g Intravenous Q12H Nolberto Hanlon, MD 200 mL/hr at 07/21/20 1428 3 g at 07/21/20 1428  . aspirin EC tablet 325 mg  325 mg Oral Lianne Cure, MD   325 mg at 07/19/20 1112  . Chlorhexidine Gluconate Cloth 2 % PADS 6 each  6 each Topical Q0600 Nolberto Hanlon, MD   6 each at 07/21/20 1009  . collagenase (SANTYL) ointment   Topical Daily Nolberto Hanlon, MD   Given at 07/21/20 1008  . feeding supplement (ENSURE ENLIVE / ENSURE PLUS) liquid 237 mL  237 mL Oral TID BM Nolberto Hanlon, MD   237 mL at 07/21/20 1429  . insulin aspart (novoLOG) injection 0-6 Units  0-6 Units Subcutaneous Q4H Doutova, Anastassia, MD      . ipratropium-albuterol (DUONEB) 0.5-2.5 (3) MG/3ML nebulizer solution 3 mL  3 mL Nebulization Q6H PRN Nolberto Hanlon, MD      . multivitamin with minerals tablet 1 tablet  1 tablet  Oral Daily Nolberto Hanlon, MD      . mupirocin ointment (BACTROBAN) 2 % 1 application  1 application Nasal BID Nolberto Hanlon,  MD   1 application at 23/76/28 1008     Abtx:  Anti-infectives (From admission, onward)   Start     Dose/Rate Route Frequency Ordered Stop   07/21/20 1400  Ampicillin-Sulbactam (UNASYN) 3 g in sodium chloride 0.9 % 100 mL IVPB        3 g 200 mL/hr over 30 Minutes Intravenous Every 12 hours 07/21/20 1308     07/19/20 0600  cefTRIAXone (ROCEPHIN) 2 g in sodium chloride 0.9 % 100 mL IVPB  Status:  Discontinued        2 g 200 mL/hr over 30 Minutes Intravenous Every 24 hours 07/18/20 2356 07/21/20 1306   07/19/20 0000  azithromycin (ZITHROMAX) 500 mg in sodium chloride 0.9 % 250 mL IVPB  Status:  Discontinued        500 mg 250 mL/hr over 60 Minutes Intravenous Every 24 hours 07/18/20 2356 07/21/20 1306   07/18/20 1900  vancomycin (VANCOCIN) IVPB 1000 mg/200 mL premix        1,000 mg 200 mL/hr over 60 Minutes Intravenous  Once 07/18/20 1849 07/18/20 2254   07/18/20 1900  ceFEPIme (MAXIPIME) 2 g in sodium chloride 0.9 % 100 mL IVPB        2 g 200 mL/hr over 30 Minutes Intravenous  Once 07/18/20 1849 07/18/20 2104      REVIEW OF SYSTEMS:  Non available Objective:  VITALS:  BP 137/61 (BP Location: Right Arm)   Pulse 89   Temp 98.5 F (36.9 C) (Oral)   Resp 18   Ht 5\' 5"  (1.651 m)   Wt 98 kg   SpO2 99%   BMI 35.95 kg/m  PHYSICAL EXAM:  General: Lethargic, on calling her name she opens her eyes she is able to say her name.  She becomes agitated, mumbles.  Not oriented and place or time, does not follow commands very well.   Head: Normocephalic, without obvious abnormality, atraumatic. Eyes: Conjunctivae clear, anicteric sclerae. Pupils are equal ENT Nares normal. No drainage or sinus tenderness. Oral cavity cannot examine Neck:  symmetrical,  Back: Reviewed the picture. Unstageable sacral decubitus Lungs: Bilateral air entry Heart: S1-S2 Abdomen: Soft, non-tender,not distended. Bowel sounds normal. No masses Foley in place Extremities: Left heel pressure injury Skin: Dry skin  on the feet Lymph: Cervical, supraclavicular normal. Neurologic: Cannot be assessed l Pertinent Labs Lab Results CBC    Component Value Date/Time   WBC 9.1 07/19/2020 0311   RBC 3.68 (L) 07/19/2020 0311   HGB 10.0 (L) 07/19/2020 0311   HGB 12.7 04/11/2017 1308   HCT 34.0 (L) 07/19/2020 0311   HCT 39.3 04/11/2017 1308   PLT 310 07/19/2020 0311   PLT 212 04/11/2017 1308   MCV 92.4 07/19/2020 0311   MCV 93.0 04/11/2017 1308   MCH 27.2 07/19/2020 0311   MCHC 29.4 (L) 07/19/2020 0311   RDW 18.2 (H) 07/19/2020 0311   RDW 15.2 (H) 04/11/2017 1308   LYMPHSABS 1.1 07/19/2020 0311   LYMPHSABS 2.0 04/11/2017 1308   MONOABS 0.6 07/19/2020 0311   MONOABS 0.7 04/11/2017 1308   EOSABS 0.2 07/19/2020 0311   EOSABS 0.3 04/11/2017 1308   BASOSABS 0.1 07/19/2020 0311   BASOSABS 0.1 04/11/2017 1308    CMP Latest Ref Rng & Units 07/21/2020 07/20/2020 07/19/2020  Glucose 70 - 99 mg/dL 89 100(H) 102(H)  BUN 8 -  23 mg/dL 95(H) 111(H) 128(H)  Creatinine 0.44 - 1.00 mg/dL 2.44(H) 3.01(H) 3.93(H)  Sodium 135 - 145 mmol/L 147(H) 148(H) 139  Potassium 3.5 - 5.1 mmol/L 3.9 4.5 5.4(H)  Chloride 98 - 111 mmol/L 110 108 102  CO2 22 - 32 mmol/L 24 26 25   Calcium 8.9 - 10.3 mg/dL 9.6 10.1 10.8(H)  Total Protein 6.5 - 8.1 g/dL - - 6.9  Total Bilirubin 0.3 - 1.2 mg/dL - - 0.8  Alkaline Phos 38 - 126 U/L - - 85  AST 15 - 41 U/L - - 28  ALT 0 - 44 U/L - - 15      Microbiology: Recent Results (from the past 240 hour(s))  Blood Culture (routine x 2)     Status: None (Preliminary result)   Collection Time: 07/18/20  4:30 PM   Specimen: BLOOD  Result Value Ref Range Status   Specimen Description BLOOD BLOOD RIGHT FOREARM  Final   Special Requests   Final    BOTTLES DRAWN AEROBIC AND ANAEROBIC Blood Culture adequate volume   Culture   Final    NO GROWTH 3 DAYS Performed at [facility], [address] [place], [place] [address]    Report Status PENDING  Incomplete  SARS Coronavirus 2 by RT  PCR (hospital order, performed in [place] hospital lab) Nasopharyngeal Nasopharyngeal Swab     Status: None   Collection Time: 07/18/20  5:39 PM   Specimen: Nasopharyngeal Swab  Result Value Ref Range Status   SARS Coronavirus 2 NEGATIVE NEGATIVE Final    Comment: (NOTE) SARS-CoV-2 target nucleic acids are NOT DETECTED.  The SARS-CoV-2 RNA is generally detectable in upper and lower respiratory specimens during the acute phase of infection. The lowest concentration of SARS-CoV-2 viral copies this assay can detect is 250 copies / mL. A negative result does not preclude SARS-CoV-2 infection and should not be used as the sole basis for treatment or other patient management decisions.  A negative result may occur with improper specimen collection / handling, submission of specimen other than nasopharyngeal swab, presence of viral mutation(s) within the areas targeted by this assay, and inadequate number of viral copies (<250 copies / mL). A negative result must be combined with clinical observations, patient history, and epidemiological information.  Fact Sheet for Patients:   StrictlyIdeas.no  Fact Sheet for Healthcare Providers: BankingDealers.co.za  This test is not yet approved or  cleared by the [place] FDA and has been authorized for detection and/or diagnosis of SARS-CoV-2 by FDA under an Emergency Use Authorization (EUA).  This EUA will remain in effect (meaning this test can be used) for the duration of the COVID-19 declaration under Section 564(b)(1) of the Act, 21 U.S.C. section 360bbb-3(b)(1), unless the authorization is terminated or revoked sooner.  Performed at [facility], [address] [place], [place] [address]   Blood Culture (routine x 2)     Status: None (Preliminary result)   Collection Time: 07/18/20  6:40 PM   Specimen: BLOOD  Result Value Ref Range Status   Specimen Description BLOOD  RIGHT ANTECUBITAL  Final   Special Requests   Final    BOTTLES DRAWN AEROBIC AND ANAEROBIC Blood Culture adequate volume   Culture   Final    NO GROWTH 3 DAYS Performed at [facility], [address] [place], [place] [address]    Report Status PENDING  Incomplete  Urine culture     Status: Abnormal   Collection Time: 07/18/20  8:26 PM   Specimen: Urine, Random  Result Value Ref Range Status   Specimen Description   Final    URINE, RANDOM Performed at [facility], [address] [place], [place] [address]    Special Requests   Final    NONE Performed at [facility], [address] [place], [place] [address]    Culture (A)  Final    >=100,000 COLONIES/mL ENTEROCOCCUS FAECALIS VANCOMYCIN RESISTANT ENTEROCOCCUS ISOLATED    Report Status 07/21/2020 FINAL  Final   Organism ID, Bacteria ENTEROCOCCUS FAECALIS (A)  Final      Susceptibility   Enterococcus faecalis - MIC*    AMPICILLIN <=2 SENSITIVE Sensitive     NITROFURANTOIN <=16 SENSITIVE Sensitive     VANCOMYCIN >=32 RESISTANT Resistant     LINEZOLID 2 SENSITIVE Sensitive     * >=100,000 COLONIES/mL ENTEROCOCCUS FAECALIS  MRSA PCR Screening     Status: Abnormal   Collection Time: 07/19/20  3:10 AM   Specimen: Nasal Mucosa; Nasopharyngeal  Result Value Ref Range Status   MRSA by PCR POSITIVE (A) NEGATIVE Final    Comment:        The GeneXpert MRSA Assay (FDA approved for NASAL specimens only), is one component of a comprehensive MRSA colonization surveillance program. It is not intended to diagnose MRSA infection nor to guide or monitor treatment for MRSA infections. RESULT CALLED TO, READ BACK BY AND VERIFIED WITH: STACY CLAY AT 0609 ON 07/19/2020 [place]. Performed at [facility], [address] [place], [place] [address]     IMAGING RESULTS:  I have personally reviewed the films ? Impression/Recommendation ? Hypotension likely due to dehydration because of poor  intake  Acute metabolic encephalopathy with underlying  ?? dementia  Enterococcus PCM in the urine is likely colonization of the chronic Foley she has.  I don't think the Foley itself was changed.  The nursing notes says the bag was changed.  Will need to check with the nursing home when the Foley was changed and if it is more than 2 weeks we'll need to change the Foley catheter.  She has bilateral basilar atelectasis doubt this is pneumonia so will not treat as pneumonia.   AKI.  On 06/15/2020 her creatinine was 1.27 and on admission it was 4.15.  BUN was 129.  This is very likely due to dehydration /prerenal.  Renal ultrasound does not show any hydronephrosis or renal stone. She also had mild hyperkalemia on presentation.  With IV hydration both have improved.  Unstageable sacral decubitus.  Seen by surgeon planning for debridement.  She is currently on Unasyn which would treat the Enterococcus in the urine as well as cover the decubitus.  Recent L3 burst fracture needing surgery with hardware L2-L5. Recent wound dehiscence and infection with E. coli and Proteus and treated with almost 8 weeks of antibiotics from November until January 7.  Initially it was IV ceftriaxone and then it was p.o. ciprofloxacin.  ?Discussed the management with the care team. Note:  This document was prepared using Dragon voice recognition software and may include unintentional dictation errors.

## 2020-07-21 NOTE — Progress Notes (Signed)
Per Family the main point of contact will be son Riki Berninger. He can be reached at [phone] or [phone].

## 2020-07-21 NOTE — Care Management Important Message (Signed)
Important Message  Patient Details  Name: [name] MRN: [id number] Date of Birth: January 10, 1930   Medicare Important Message Given:  Yes     Dannette Barbara 07/21/2020, 10:58 AM

## 2020-07-21 NOTE — Progress Notes (Signed)
SLP Cancellation Note  Patient Details Name: [name] MRN: [id number] DOB: 04/22/1930   Cancelled treatment:       Reason Eval/Treat Not Completed: (P) Other (comment) (refused). Attempted visit; RN assisted with repositioning, however pt refused all attempts to participate in session, consume PO. Continues refusing with nursing, however RN reports family member present and was able to encourage pt to take several bites of soup last night, which was well-tolerated. Continue full liquids, SLP to follow up. D/w MD; pursuing CT imaging to investigate mental status changes.   Deneise Lever, [place], CCC-SLP Speech-Language Pathologist    Aliene Altes 07/21/2020, 9:33 AM

## 2020-07-21 NOTE — Consult Note (Signed)
[facility] SURGICAL CONSULTATION NOTE (initial) - cpt: 24097   HISTORY OF PRESENT ILLNESS (HPI):  87 y.o. female presented to [facility] ED initially on 02/05 from her assisted living facility secondary to hypotension. She does have a history of dementia which limits history. Per chart review, she was found to have BP of 96/34 at SNF and ENS was called. BP improved with IVF bolus. Work up in the ED showed significant AKI with sCr - 4.15 (improved to 2.44 today), hyperkalemia to 5.2, leukocytosis to 11.9K, and UCx with VRE. She was ultimately admitted to the hospitalist service. She did undergo CT Head to re-evaluate change in mental status yesterday and this showed a small intraventricular bleed. She was seen by neurosurgery (Dr Lacinda Axon, MD) and recommend follow up CTA when feasible but nothing further. Additionally, she was noted to have numerous pressure injuries to her left heel, mid back, and sacrum. She was seen by [facility] RN yesterday as well and noted to have 5 x 15 cm sacral wound with large eschar and was noted to be malodorous. Currently using santyl for enzymatic debridement. Abx coverage with Unasyn.   Surgery is consulted by hospitalist physician Dr. Nolberto Hanlon, MD in this context for evaluation and management of unstageable sacral wound.  PAST MEDICAL HISTORY (PMH):  Past Medical History:  Diagnosis Date  . Anemia   . Anxiety   . Atrial flutter ([place])    "sometimes"  . Blood transfusion   . Breast cancer ([place])    right breast  . Bronchitis   . Chronic kidney disease    occ uti's  . CKD (chronic kidney disease) stage 3, GFR 30-59 ml/min ([name])   . Colon polyps    hyperplastic  . CVA (cerebral infarction)   . Diverticulosis   . DVT of leg (deep venous thrombosis) ([name])    left; S/P OR  . Dysrhythmia    dr bensimhon   . Esophageal stricture   . GERD (gastroesophageal reflux disease)   . Gout   . Hiatal hernia    4 cm  . History of radiation therapy 02/15/12-04/02/12    right breast 4680 cGy/26 sessions, right boost=1400cGy /7 sessions  . Hyperlipidemia   . Hypertension   . IBS (irritable bowel syndrome)   . Kidney stones 1990's  . Morbid obesity ([place])   . Osteoarthritis   . Personal history of radiation therapy   . Pneumonia    "walking"  . PONV (postoperative nausea and vomiting)   . Shingles 02/21/2011  . Shortness of breath on exertion   . Skin cancer   . Stroke [facility]) 2002   residual:  "little tingling in palm of left hand"  . UTI (lower urinary tract infection)    "I've had a few"     PAST SURGICAL HISTORY ([place]):  Past Surgical History:  Procedure Laterality Date  . ABDOMINAL HYSTERECTOMY    . BILE DUCT STENT PLACEMENT  ~ 01/2011  . BREAST LUMPECTOMY  06/29/11   right  . BREAST LUMPECTOMY  06/29/2011   Procedure: BREAST LUMPECTOMY WITH EXCISION OF SENTINEL NODE;  Surgeon: Pedro Earls, MD;  Location: [place];  Service: General;  Laterality: Right;  right sentinel node mapping,right sentinel node biopsy, needle localization right breast lumpectomy  . CATARACT EXTRACTION W/ INTRAOCULAR LENS  IMPLANT, BILATERAL  1990's  . CHOLECYSTECTOMY  03/23/11   lap chole   . COLON SURGERY     hole in intestine ,tumors?  . colonic perforation repair  ~  [place]  2012  . INCISION AND DRAINAGE ABSCESS N/A 08/12/2013   Procedure: INCISION AND DRAINAGE ABSCESS;  Surgeon: Earnstine Regal, MD;  Location: [facility];  Service: General;  Laterality: N/A;  . INCISION AND DRAINAGE ABSCESS N/A 08/20/2013   Procedure: INCISION AND DRAINAGE ABSCESS;  Surgeon: Ralene Ok, MD;  Location[facility];  Service: General;  Laterality: N/A;  . INCISION AND DRAINAGE BREAST ABSCESS     right breast seroma  . IRRIGATION AND DEBRIDEMENT ABSCESS N/A 08/14/2013   Procedure: Debridment of perineal tissue and debridement of perineal wound;  Surgeon: Earnstine Regal, MD;  [facility];  Service: General;  Laterality: N/A;  . IRRIGATION AND DEBRIDEMENT ABSCESS N/A  08/16/2013   Procedure: DRESSING CHANGE AND DEBRIDEMENT OF PERINEUM WOUND;  Surgeon: Earnstine Regal, MD;  [facility];  Service: General;  Laterality: N/A;  . JOINT REPLACEMENT    . MASS EXCISION Right 12/26/2018   Procedure: EXCISION RIGHT ARM SKIN MASS;  Surgeon: Erroll Luna, MD;  Location: [place];  Service: General;  Laterality: Right;  . MINOR APPLICATION OF WOUND VAC N/A 08/20/2013   Procedure: MINOR APPLICATION OF WOUND VAC;  Surgeon: Ralene Ok, MD;  [facility];  Service: General;  Laterality: N/A;  . pelvic bone tumor removal     twice in 2000's  . PILONIDAL CYST DRAINAGE N/A 08/19/2013   Procedure: Dressing change perineum;  Surgeon: Ralene Ok, MD;  [facility];  Service: General;  Laterality: N/A;  . REPLACEMENT TOTAL KNEE BILATERAL  ~ 2008; ~ 2010   right; left  . TONSILLECTOMY  ~ 1946  . TOTAL HIP ARTHROPLASTY  1980's   right     MEDICATIONS:  Prior to Admission medications   Medication Sig Start Date End Date Taking? Authorizing Provider  acetaminophen (TYLENOL) 500 MG tablet Take 1,000 mg by mouth every 8 (eight) hours.   Yes [provider]  acidophilus (RISAQUAD) CAPS capsule Take 1 capsule by mouth at bedtime.   Yes [provider]  allopurinol (ZYLOPRIM) 100 MG tablet Take 100 mg by mouth every other day.   Yes [provider]  allopurinol (ZYLOPRIM) 100 MG tablet Take 200 mg by mouth every other day.   Yes [provider]  amLODipine (NORVASC) 5 MG tablet TAKE 1 TABLET BY MOUTH ONCE A DAY Patient taking differently: Take 5 mg by mouth daily. 01/27/20  Yes Biagio Borg, MD  aspirin 325 MG EC tablet Take 1 tablet (325 mg total) by mouth every other day. 06/23/14  Yes Hosie Poisson, MD  furosemide (LASIX) 40 MG tablet Take 40 mg by mouth daily.   Yes [provider]  gabapentin (NEURONTIN) 100 MG capsule Take 100 mg by mouth 2 (two) times daily.   Yes [provider]   HYDROcodone-acetaminophen (NORCO/VICODIN) 5-325 MG tablet Take 1 tablet by mouth every 6 (six) hours as needed for moderate pain.   Yes [provider]  ipratropium-albuterol (DUONEB) 0.5-2.5 (3) MG/3ML SOLN Take 3 mLs by nebulization in the morning and at bedtime.   Yes [provider]  lisinopril (ZESTRIL) 20 MG tablet Take 40 mg by mouth daily.   Yes [provider]  Magnesium Hydroxide (MILK OF MAGNESIA PO) Take 30 mLs by mouth 2 (two) times daily.   Yes [provider]  Morphine Sulfate (MORPHINE CONCENTRATE) 10 mg / 0.5 ml concentrated solution Take 5 mg by mouth every 4 (four) hours as needed  for severe pain.   Yes [provider]  Multiple Vitamin (MULTIVITAMIN ADULT PO) Take 1 tablet by mouth daily.   Yes [provider]  vitamin C (ASCORBIC ACID) 250 MG tablet Take 250 mg by mouth 2 (two) times daily.   Yes [provider]  Wound Dressings (MEDIHONEY WOUND/BURN DRESSING) GEL Apply 1 application topically daily. (apply to coccyx and cover with calcium alginate dressing)   Yes [provider]     ALLERGIES:  Allergies  Allergen Reactions  . Ace Inhibitors Other (See Comments)    Tolerates lisinopril at home. Pt does not recall allergy.  . Atorvastatin     REACTION: myalgias  . Oxycodone-Acetaminophen     REACTION: confusion, fatigue  . Rosuvastatin     REACTION: leg weakness  . Simvastatin     REACTION: leg weakness  . Codeine Nausea And Vomiting and Rash  . Sulfonamide Derivatives Hives and Rash     SOCIAL HISTORY:  Social History   Socioeconomic History  . Marital status: Married    Spouse name: Not on file  . Number of children: Not on file  . Years of education: Not on file  . Highest education level: Not on file  Occupational History  . Occupation: hairdresser  Tobacco Use  . Smoking status: Never Smoker  . Smokeless tobacco: Never Used  Substance and Sexual Activity  . Alcohol use: No  .  Drug use: No  . Sexual activity: Never  Other Topics Concern  . Not on file  Social History Narrative   Lives at home with her husband.  Uses a walker for ambulation   Social Determinants of Health   Financial Resource Strain: Not on file  Food Insecurity: Not on file  Transportation Needs: Not on file  Physical Activity: Not on file  Stress: Not on file  Social Connections: Not on file  Intimate Partner Violence: Not on file     FAMILY HISTORY:  Family History  Problem Relation Age of Onset  . Breast cancer Sister        x 2  . Prostate cancer Brother   . Stroke Mother   . Kidney failure Sister   . Diabetes Sister   . Heart disease Paternal Uncle        multiple  . Heart disease Maternal Uncle        multiple  . Colon cancer Neg Hx       REVIEW OF SYSTEMS:  Review of Systems  Unable to perform ROS: Dementia  Skin:       + Sacral Wound    VITAL SIGNS:  Temp:  [97.6 F (36.4 C)-99 F (37.2 C)] 98.5 F (36.9 C) (02/08 1154) Pulse Rate:  [79-94] 89 (02/08 1154) Resp:  [16-20] 18 (02/08 1154) BP: (109-142)/(43-104) 137/61 (02/08 1154) SpO2:  [99 %-100 %] 99 % (02/08 1154)     Height: 5\' 5"  (165.1 cm) Weight: 98 kg BMI (Calculated): 35.95   INTAKE/OUTPUT:  02/07 0701 - 02/08 0700 In: 520.2 [I.V.:420.2; IV Piggyback:100] Out: 1750 [Urine:1750]  PHYSICAL EXAM:  Physical Exam Vitals and nursing note reviewed. Exam conducted with a chaperone present.  Constitutional:      General: She is not in acute distress.    Appearance: She is obese. She is not ill-appearing.     Comments: Patient resting in bed, confused, asking repetitive questions  HENT:     Head: Normocephalic and atraumatic.     Mouth/Throat:     Mouth:  Mucous membranes are dry.  Eyes:     Conjunctiva/sclera: Conjunctivae normal.     Pupils: Pupils are equal, round, and reactive to light.  Cardiovascular:     Rate and Rhythm: Normal rate.     Pulses: Normal pulses.     Heart sounds: No murmur  heard.   Pulmonary:     Effort: Pulmonary effort is normal. No respiratory distress.  Genitourinary:    Comments: Deferred Skin:    General: Skin is warm and dry.       Neurological:     Comments: Unable to reliably assess secondary to dementia  Psychiatric:     Comments: Unable to reliably assess secondary to dementia     Sacral Wound (07/21/2020):      Labs:  CBC Latest Ref Rng & Units 07/19/2020 07/18/2020 04/22/2020  WBC 4.0 - 10.5 K/uL 9.1 11.9(H) 6.1  Hemoglobin 12.0 - 15.0 g/dL 10.0(L) 10.1(L) 11.9(L)  Hematocrit 36.0 - 46.0 % 34.0(L) 32.6(L) 40.8  Platelets 150 - 400 K/uL 310 320 312   CMP Latest Ref Rng & Units 07/21/2020 07/20/2020 07/19/2020  Glucose 70 - 99 mg/dL 89 100(H) 102(H)  BUN 8 - 23 mg/dL 95(H) 111(H) 128(H)  Creatinine 0.44 - 1.00 mg/dL 2.44(H) 3.01(H) 3.93(H)  Sodium 135 - 145 mmol/L 147(H) 148(H) 139  Potassium 3.5 - 5.1 mmol/L 3.9 4.5 5.4(H)  Chloride 98 - 111 mmol/L 110 108 102  CO2 22 - 32 mmol/L 24 26 25   Calcium 8.9 - 10.3 mg/dL 9.6 10.1 10.8(H)  Total Protein 6.5 - 8.1 g/dL - - 6.9  Total Bilirubin 0.3 - 1.2 mg/dL - - 0.8  Alkaline Phos 38 - 126 U/L - - 85  AST 15 - 41 U/L - - 28  ALT 0 - 44 U/L - - 15     Imaging studies:  No pertinent imaging studies   Assessment/Plan: (ICD-10's: L89.150) 87 y.o. female admitted with AKI and VRE UTI found to have large unstageable sacral pressure injury, complicated by pertinent comorbidities including advanced age, frailty, and dementia.   - She will benefit from formal debridement in the OR should family wish to pursue all aggressive measures. We will tentatively plan for this tomorrow with Dr Hampton Abbot pending OR/Anesthesia availability  - All risks, benefits, and alternatives to above procedure(s) were discussed with the patient's son via telephone, all of his questions were answered to his expressed satisfaction, patient expresses he wishes to proceed, and informed consent was obtained.   - NPO at  midnight   - Continue ABx (Unasyn)  - Pain control prn  - Recommend continue local wound care, frequent repositioning +/- low air loss mattress   - Further management per primary service   All of the above findings and recommendations were discussed with the patient's family via telephone, and all of their questions were answered to their expressed satisfaction.  Thank you for the opportunity to participate in this patient's care.   -- Edison Simon, PA-C [facility] 07/21/2020, 1:29 PM [phone] M-F: 7am - 4pm

## 2020-07-21 NOTE — NC FL2 (Signed)
[place] MEDICAID FL2 LEVEL OF CARE SCREENING TOOL     IDENTIFICATION  Patient Name: [name] Birthdate: [birth year] Sex: female Admission Date (Current Location): 07/18/2020  [facility] and [place] Number:  [name] (Was staying at [facility]. Husband lives in [place] and Address:  [facility], [address] [place], [place] [address]      Provider Number: [id number]  Attending Physician Name and Address:  Nolberto Hanlon, MD  Relative Name and Phone Number:       Current Level of Care: Hospital Recommended Level of Care: [place] Prior Approval Number:    Date Approved/Denied:   PASRR Number: [id number]  Discharge Plan: SNF    Current Diagnoses: Patient Active Problem List   Diagnosis Date Noted  . Dehydration 07/18/2020  . Hyperkalemia 07/18/2020  . CAP (community acquired pneumonia) 07/18/2020  . Acute metabolic encephalopathy 24/02/7352  . Closed burst fracture of lumbar vertebra ([place]) 04/03/2020  . Acute respiratory failure with hypoxia and hypercapnia ([place]) 04/03/2020  . ATN (acute tubular necrosis) ([place]) 04/03/2020  . H/O bad fall 04/03/2020  . HCAP (healthcare-associated pneumonia) 04/03/2020  . Hematochezia 05/29/2019  . Diverticulosis   . Swelling of vagina 04/16/2019  . Skin lesion 03/26/2019  . Left leg cellulitis 01/15/2019  . Healed diabetic foot ulcer 12/01/2017  . Abnormal TSH 11/15/2016  . Preventative health care 04/20/2016  . Allergic rhinitis 04/20/2016  . Dysuria 12/09/2014  . External hemorrhoids without complication 29/92/4268  . Left ear pain 07/08/2014  . Hip pain   . GIB (gastrointestinal bleeding) 06/18/2014  . UTI (lower urinary tract infection) 06/18/2014  . Abdominal pain   . Lower GI bleed   . Arthritis 02/18/2014  . Enteritis due to Clostridium difficile 10/12/2013  . CKD (chronic kidney disease) stage 3, GFR 30-59 ml/min ([name]) 10/11/2013  .  Chronic diastolic CHF (congestive heart failure) ([place]) 10/11/2013  . Protein-calorie malnutrition, severe ([place]) 10/11/2013  . Hx of necrotizing fasciitis 10/10/2013  . GI bleed 10/10/2013  . AKI (acute kidney injury) ([place]) 10/10/2013  . Cough 09/10/2013  . Rectal bleeding 09/10/2013  . Acute on chronic diastolic CHF (congestive heart failure) ([place]) 08/31/2013  . Rash and nonspecific skin eruption 08/27/2013  . Pulmonary edema 08/25/2013  . Severe sepsis with septic shock ([place]) 08/13/2013  . Necrotizing fasciitis of perineum 08/12/2013  . Acute on chronic kidney disease, stage 3 08/12/2013  . Wheezing 06/07/2013  . Wound healing, delayed 01/26/2012  . Diverticulosis of colon with hemorrhage 12/19/2011  . Acute blood loss anemia 12/19/2011  . Malignant neoplasm of upper-outer quadrant of right breast in female, estrogen receptor positive ([place]) 08/01/2011  . Breast cancer, right breast-lobular s/p lumpectomy and sentinel node biopsy 06/30/2011  . Shingles 02/21/2011  . Bile duct calculus with nonacute cholecystitis 01/21/2011  . Morbid obesity ([place]) 01/21/2011  . TREMOR 08/17/2010  . Diabetes ([place]) 01/09/2008  . PERIPHERAL NEUROPATHY 10/17/2007  . FATIGUE 10/17/2007  . COLONIC POLYPS, HX OF 02/12/2007  . Hyperlipidemia 02/10/2007  . Gout 02/10/2007  . ANXIETY 02/10/2007  . Essential hypertension 02/10/2007  . CVA 02/10/2007  . DIVERTICULOSIS, COLON 02/10/2007  . IRRITABLE BOWEL, PREDOMINANTLY CONSTIPATION 02/10/2007    Orientation RESPIRATION BLADDER Height & Weight      (Disoriented x 4)  O2 (2 L Nasal Canula) Incontinent,Indwelling catheter Weight: 216 lb 0.8 oz (98 kg) Height:  5\' 5"  (165.1 cm)  BEHAVIORAL SYMPTOMS/MOOD NEUROLOGICAL BOWEL NUTRITION STATUS  Other (Comment) (Anxious, restless)  (  None) Continent Diet (Full liquid. Full supervision/assist. Meds crushed in puree.)  AMBULATORY STATUS COMMUNICATION OF NEEDS Skin   Total Care Verbally Skin abrasions,Bruising,Other  (Comment),PU Stage and Appropriate Care (Unstageable pressure ulcer on medial sacrum: Foam, wet-to-dry, ABD, santyl. Unstageable pressure ulcer on left heel: santyl, wet-to-dry, ABD, tape.)   PU Stage 2 Dressing:  (Mid lumbar: Foam.)                   Personal Care Assistance Level of Assistance  Bathing,Feeding,Dressing Bathing Assistance: Maximum assistance Feeding assistance: Maximum assistance Dressing Assistance: Maximum assistance Total Care Assistance: Maximum assistance   Functional Limitations Info  Sight,Hearing,Speech Sight Info: Adequate Hearing Info: Adequate Speech Info: Adequate    SPECIAL CARE FACTORS FREQUENCY  PT (By licensed PT),OT (By licensed OT),Speech therapy     PT Frequency: 5 x week OT Frequency: 5 x week     Speech Therapy Frequency: 5 x week      Contractures Contractures Info: Not present    Additional Factors Info  Code Status,Allergies Code Status Info: Full code Allergies Info: Ace Inhibitors, Atorvastatin, Oxycodone-acetaminophen, Rosuvastatin, Simvastatin, Codeine, Sulfonamide Derivatives           Current Medications (07/21/2020):  This is the current hospital active medication list Current Facility-Administered Medications  Medication Dose Route Frequency Provider Last Rate Last Admin  . 0.45 % sodium chloride infusion   Intravenous Continuous Nolberto Hanlon, MD 100 mL/hr at 07/20/20 2358 New Bag at 07/20/20 2358  . acetaminophen (TYLENOL) tablet 650 mg  650 mg Oral Q6H PRN Toy Baker, MD   650 mg at 07/20/20 1601   Or  . acetaminophen (TYLENOL) suppository 650 mg  650 mg Rectal Q6H PRN Doutova, Anastassia, MD      . albuterol (PROVENTIL) (2.5 MG/3ML) 0.083% nebulizer solution 2.5 mg  2.5 mg Nebulization Q2H PRN Doutova, Anastassia, MD      . aspirin EC tablet 325 mg  325 mg Oral QODAY Doutova, Anastassia, MD   325 mg at 07/19/20 1112  . azithromycin (ZITHROMAX) 500 mg in sodium chloride 0.9 % 250 mL IVPB  500 mg Intravenous  Q24H Doutova, Anastassia, MD 250 mL/hr at 07/21/20 0041 500 mg at 07/21/20 0041  . cefTRIAXone (ROCEPHIN) 2 g in sodium chloride 0.9 % 100 mL IVPB  2 g Intravenous Q24H Doutova, Anastassia, MD 200 mL/hr at 07/21/20 0606 2 g at 07/21/20 0606  . Chlorhexidine Gluconate Cloth 2 % PADS 6 each  6 each Topical Q0600 Nolberto Hanlon, MD   6 each at 07/20/20 1001  . collagenase (SANTYL) ointment   Topical Daily Nolberto Hanlon, MD   Given at 07/20/20 1654  . feeding supplement (ENSURE ENLIVE / ENSURE PLUS) liquid 237 mL  237 mL Oral TID BM Amery, Sahar, MD      . insulin aspart (novoLOG) injection 0-6 Units  0-6 Units Subcutaneous Q4H Doutova, Anastassia, MD      . ipratropium-albuterol (DUONEB) 0.5-2.5 (3) MG/3ML nebulizer solution 3 mL  3 mL Nebulization Q6H PRN Nolberto Hanlon, MD      . multivitamin with minerals tablet 1 tablet  1 tablet Oral Daily Nolberto Hanlon, MD      . mupirocin ointment (BACTROBAN) 2 % 1 application  1 application Nasal BID Nolberto Hanlon, MD   1 application at 09/32/35 2013     Discharge Medications: Please see discharge summary for a list of discharge medications.  Relevant Imaging Results:  Relevant Lab Results:   Additional Information SS#: [id number]. Was paying privately  for LTC at [facility]. Family wants her closer to home in the [place] area.  Candie Chroman, LCSW

## 2020-07-21 NOTE — Progress Notes (Signed)
PROGRESS NOTE    [name]  YPP:[id number] DOB: 06-12-1930 DOA: 07/18/2020 PCP: Biagio Borg, MD    Brief Narrative:  [name] is a 87 y.o. female with medical history significant of dementia, diastolic CHF  s/p Lumbar fusion due to L3 frx and repair of traumatic CSF leak Oct 2021, anemia, atrial flutter, breast cancer status post radiation therapy, CKD stage III, history of CVA, history of DVT left leg postoperatively, is a   esophageal stricture, GERD,  C. difficile infection, left foot pressure ulcer, severe osteopenia, falls  .Presented with  Hypotension initial BP at facility down to 96/34 EMS gave 500 ml bolus and BP was 131/48 Per nursing staff patient have had decreased urine output. She has been somnolent/confused while here. She is found with AKI, Ucx+, and PNA . Ct head obtained +for acute intrav. Bleed, but trace. Neurosurgery was consulted. Son [name] was notified.  Consulted palliative care. Family wants to keep her full code for now.ID consulted.  2/6-confused, quiet.  2/7-sitting up in bed, still confused, mumbling something. (spoke to son, states since October she has been "mumbling" and not being her self. Prior to October (before surgery) pt apparently was active and sharp. 2/8-somnolent today. Does not open eyes for me. Per nsg does take meds . CT head obtained-see result  Consultants:     Procedures:   Antimicrobials:   azithro / ceftriaxone 4 days   Subjective: Lying in bed on her side, somnolent  Objective: Vitals:   07/20/20 1429 07/20/20 1937 07/21/20 0047 07/21/20 0610  BP: (!) 109/47 (!) 142/104 (!) 121/43 125/66  Pulse: 80 87 79 94  Resp: 18 20 16 16   Temp: 98.1 F (36.7 C) 98 F (36.7 C) 97.6 F (36.4 C) 99 F (37.2 C)  TempSrc: Axillary Oral  Axillary  SpO2: 100% 99% 99%   Weight:      Height:        Intake/Output Summary (Last 24 hours) at 07/21/2020 0818 Last data filed at 07/21/2020 0432 Gross per 24 hour  Intake 520.15 ml   Output 1750 ml  Net -1229.85 ml   Filed Weights   07/18/20 1603  Weight: 98 kg    Examination: Somnolent, NAD Decreased breath sounds, no wheezing Regular S1-S2 no gallops Soft benign positive bowel sounds No edema Unable to assess mood or psych   Data Reviewed: I have personally reviewed following labs and imaging studies  CBC: Recent Labs  Lab 07/18/20 1554 07/19/20 0311  WBC 11.9* 9.1  NEUTROABS 6.7 7.1  HGB 10.1* 10.0*  HCT 32.6* 34.0*  MCV 90.6 92.4  PLT 320 458   Basic Metabolic Panel: Recent Labs  Lab 07/18/20 1554 07/18/20 2342 07/19/20 0311 07/20/20 0424 07/21/20 0421  NA 139 139 139 148* 147*  K 5.2* 5.3* 5.4* 4.5 3.9  CL 99 102 102 108 110  CO2 27 25 25 26 24   GLUCOSE 102* 130* 102* 100* 89  BUN 129* 129* 128* 111* 95*  CREATININE 4.15* 3.94* 3.93* 3.01* 2.44*  CALCIUM 10.9* 10.6* 10.8* 10.1 9.6  MG  --  2.6* 2.6*  --   --   PHOS  --   --  6.4*  --   --    GFR: Estimated Creatinine Clearance: 18.5 mL/min (A) (by C-G formula based on SCr of 2.44 mg/dL (H)). Liver Function Tests: Recent Labs  Lab 07/18/20 1554 07/19/20 0311  AST 24 28  ALT 14 15  ALKPHOS 83 85  BILITOT 0.7 0.8  PROT 7.0 6.9  ALBUMIN 2.4* 2.4*   No results for input(s): LIPASE, AMYLASE in the last 168 hours. No results for input(s): AMMONIA in the last 168 hours. Coagulation Profile: No results for input(s): INR, PROTIME in the last 168 hours. Cardiac Enzymes: Recent Labs  Lab 07/18/20 2342 07/19/20 0311  CKTOTAL 373* 460*   BNP (last 3 results) No results for input(s): PROBNP in the last 8760 hours. HbA1C: Recent Labs    07/19/20 0024  HGBA1C 5.6   CBG: Recent Labs  Lab 07/20/20 1650 07/20/20 2009 07/21/20 0042 07/21/20 0344 07/21/20 0739  GLUCAP 97 96 84 91 80   Lipid Profile: No results for input(s): CHOL, HDL, LDLCALC, TRIG, CHOLHDL, LDLDIRECT in the last 72 hours. Thyroid Function Tests: Recent Labs    07/19/20 0311  TSH 5.401*    Anemia Panel: No results for input(s): VITAMINB12, FOLATE, FERRITIN, TIBC, IRON, RETICCTPCT in the last 72 hours. Sepsis Labs: Recent Labs  Lab 07/18/20 1554 07/18/20 2105 07/19/20 0311 07/20/20 0424  PROCALCITON 0.51  --  0.46 0.38  LATICACIDVEN 1.0 0.9  --   --     Recent Results (from the past 240 hour(s))  Blood Culture (routine x 2)     Status: None (Preliminary result)   Collection Time: 07/18/20  4:30 PM   Specimen: BLOOD  Result Value Ref Range Status   Specimen Description BLOOD BLOOD RIGHT FOREARM  Final   Special Requests   Final    BOTTLES DRAWN AEROBIC AND ANAEROBIC Blood Culture adequate volume   Culture   Final    NO GROWTH 3 DAYS Performed at [facility], [address] [place], [place] [address]    Report Status PENDING  Incomplete  SARS Coronavirus 2 by RT PCR (hospital order, performed in [place] hospital lab) Nasopharyngeal Nasopharyngeal Swab     Status: None   Collection Time: 07/18/20  5:39 PM   Specimen: Nasopharyngeal Swab  Result Value Ref Range Status   SARS Coronavirus 2 NEGATIVE NEGATIVE Final    Comment: (NOTE) SARS-CoV-2 target nucleic acids are NOT DETECTED.  The SARS-CoV-2 RNA is generally detectable in upper and lower respiratory specimens during the acute phase of infection. The lowest concentration of SARS-CoV-2 viral copies this assay can detect is 250 copies / mL. A negative result does not preclude SARS-CoV-2 infection and should not be used as the sole basis for treatment or other patient management decisions.  A negative result may occur with improper specimen collection / handling, submission of specimen other than nasopharyngeal swab, presence of viral mutation(s) within the areas targeted by this assay, and inadequate number of viral copies (<250 copies / mL). A negative result must be combined with clinical observations, patient history, and epidemiological information.  Fact Sheet for Patients:    StrictlyIdeas.no  Fact Sheet for Healthcare Providers: BankingDealers.co.za  This test is not yet approved or  cleared by the [place] FDA and has been authorized for detection and/or diagnosis of SARS-CoV-2 by FDA under an Emergency Use Authorization (EUA).  This EUA will remain in effect (meaning this test can be used) for the duration of the COVID-19 declaration under Section 564(b)(1) of the Act, 21 U.S.C. section 360bbb-3(b)(1), unless the authorization is terminated or revoked sooner.  Performed at [facility], [address] [place], [place] [address]   Blood Culture (routine x 2)     Status: None (Preliminary result)   Collection Time: 07/18/20  6:40 PM   Specimen: BLOOD  Result Value Ref Range Status   Specimen Description BLOOD RIGHT ANTECUBITAL  Final   Special Requests   Final    BOTTLES DRAWN AEROBIC AND ANAEROBIC Blood Culture adequate volume   Culture   Final    NO GROWTH 3 DAYS Performed at [facility], [address], [place], [place] [address]    Report Status PENDING  Incomplete  Urine culture     Status: Abnormal (Preliminary result)   Collection Time: 07/18/20  8:26 PM   Specimen: Urine, Random  Result Value Ref Range Status   Specimen Description   Final    URINE, RANDOM Performed at [facility], [address] [place], [place] [address]    Special Requests   Final    NONE Performed at [facility], [address] [place], [place] [address]    Culture (A)  Final    >=100,000 COLONIES/mL ENTEROCOCCUS FAECALIS SUSCEPTIBILITIES TO FOLLOW Performed at [facility], [place] [address] [place], [place] [address]    Report Status PENDING  Incomplete  MRSA PCR Screening     Status: Abnormal   Collection Time: 07/19/20  3:10 AM   Specimen: Nasal Mucosa; Nasopharyngeal  Result Value Ref Range Status   MRSA by PCR POSITIVE (A) NEGATIVE Final    Comment:         The GeneXpert MRSA Assay (FDA approved for NASAL specimens only), is one component of a comprehensive MRSA colonization surveillance program. It is not intended to diagnose MRSA infection nor to guide or monitor treatment for MRSA infections. RESULT CALLED TO, READ BACK BY AND VERIFIED WITH: STACY CLAY AT 0609 ON 07/19/2020 [place]. Performed at [facility], [address] [place], [place] [address]          Radiology Studies: CT HEAD WO CONTRAST  Result Date: 07/20/2020 CLINICAL DATA:  Mental status change, unknown cause. Additional provided: History of dementia, increased altered mental status today. EXAM: CT HEAD WITHOUT CONTRAST TECHNIQUE: Contiguous axial images were obtained from the base of the skull through the vertex without intravenous contrast. COMPARISON:  Report from MRI/MRA head 05/27/2002 (images unavailable). Report from head CT 05/27/2002 (images unavailable). FINDINGS: Brain: Mildly motion degraded exam. Mild cerebral and cerebellar atrophy. Trace hyperdensity within the lateral ventricle occipital horns bilaterally, likely reflecting acute intraventricular hemorrhage (series 4, image 14). Ill-defined hypoattenuation within the cerebral white matter is nonspecific, but compatible with chronic small vessel ischemic disease. No demarcated cortical infarct. No extra-axial fluid collection. No evidence of intracranial mass. No midline shift. Vascular: No hyperdense vessel.  Atherosclerotic calcifications. Skull: Normal. Negative for fracture or focal lesion. Sinuses/Orbits: Visualized orbits show no acute finding. Small left maxillary sinus mucous retention cyst. These results were called by telephone at the time of interpretation on 07/20/2020 at 4:24 pm to provider [facility] , who verbally acknowledged these results. IMPRESSION: Mildly motion degraded examination. Trace hyperdensity within the lateral ventricle occipital horns bilaterally, likely reflecting acute  intraventricular hemorrhage. Mild generalized atrophy of the brain with chronic small vessel ischemic disease. Electronically Signed   By: Kellie Simmering DO   On: 07/20/2020 16:25        Scheduled Meds: . aspirin  325 mg Oral QODAY  . Chlorhexidine Gluconate Cloth  6 each Topical Q0600  . collagenase   Topical Daily  . feeding supplement  237 mL Oral TID BM  . insulin aspart  0-6 Units Subcutaneous Q4H  . multivitamin with minerals  1 tablet Oral Daily  .  mupirocin ointment  1 application Nasal BID   Continuous Infusions: . sodium chloride 100 mL/hr at 07/20/20 2358  . azithromycin 500 mg (07/21/20 0041)  . cefTRIAXone (ROCEPHIN)  IV 2 g (07/21/20 0606)    Assessment & Plan:   Active Problems:   Diabetes ([place])   Hyperlipidemia   Essential hypertension   Malignant neoplasm of upper-outer quadrant of right breast in female, estrogen receptor positive ([place])   Wound healing, delayed   AKI (acute kidney injury) ([place])   CKD (chronic kidney disease) stage 3, GFR 30-59 ml/min ([name])   Chronic diastolic CHF (congestive heart failure) ([name])   Protein-calorie malnutrition, severe ([name])   Dehydration   Hyperkalemia   CAP (community acquired pneumonia)   Acute metabolic encephalopathy   Acute metabolic encephalopathy -   - most likely multifactorial secondary to combination of  infection ,dehydration , AKI, and acute intraventricular hemorrhage.  Continue ivf , will decrease rate to 57ml/hr Continue ivabx neurochecks Neurosurgery consulted-Dr.Cook to see .-pending Likely will need repeat CT head in couple of days to make sure bleed stable Palliative care consult   UTI- +UA and ucx with Enter faeca.  Vancomycin-resistant Enterococcus isolated. ID consulted.   Probable Pneumonia- cxr with findings. Leukocytosis trending down Procalcitonin decreasing 2/8-with her Ucx finding , will switch iv cef/azith to unasyn F/u ID rec. Incase its different. Today day 4 of  cef/azith.  Acute intraventricular hemorrhage-Trace .  Neurosurgery consulted  . Wound healing, delayed -as well as pressure ulcer on left heel Wound care nursing consulted  . Protein-calorie malnutrition, severe ([place]) - Speech and RD following  . Hyperlipidemia -chronic stable not on statin statin  . Essential hypertension -hold home medications given hypotension admission  . CKD (chronic kidney disease) stage 3, GFR 30-59 ml/min ([name]) acute on chronic renal failure likely secondary to severe dehydration. F/u renal US-neck renal medical disease no hydronephrosis  -chronic avoid nephrotoxic medications such as NSAIDs, Vanco Zosyn combo,  avoid hypotension Monitor levels  . Chronic diastolic CHF (congestive heart failure) ([name]) currently appears to be in a dry side Hold lasix  . AKI (acute kidney injury) ([place]) -appears to be prerenal-dehydration, on ACE and Lasix.   Hold lisinopril rehydrate and continue to follow RENAL US showing no evidence of hydronephrosis 2/8- improving creatinine, today 2.44 Continue to monitor   . Dehydration -continue with IV fluid  . Hyperkalemia - Given Kayexalate x1 K today stable at 3.9    DM2-  -controlled A1c 5.6 TSH 5.4, will need recheck in 4 weeks when acute phase illness improves       DVT prophylaxis: Lovenox...>dc'd due to brain bleed. scd placed. Code Status: Full Family Communication: Updated son [name]..>discussed code status, family wants to continue being full code  Status is: Inpatient  Remains inpatient appropriate because:IV treatments appropriate due to intensity of illness or inability to take PO   Dispo: The patient is from: SNF              Anticipated d/c is to: SNF              Anticipated d/c date is: > 3 days              Patient currently is not medically stable to d/c.consulted palliative. Prognosis px long term.   Difficult to place patient No            LOS: 3 days   Time spent: 45  minutes with more than 50% on COC  Nolberto Hanlon, MD [facility] Pager [phone]  If 7PM-7AM, please contact night-coverage 07/21/2020, 8:18 AM

## 2020-07-22 ENCOUNTER — Inpatient Hospital Stay: Payer: Medicare Other | Admitting: Anesthesiology

## 2020-07-22 ENCOUNTER — Encounter: Payer: Self-pay | Admitting: Internal Medicine

## 2020-07-22 ENCOUNTER — Encounter
Admission: EM | Disposition: A | Discharge status: Skilled Nursing Facility | Payer: Self-pay | Source: Home / Self Care | Attending: Internal Medicine

## 2020-07-22 DIAGNOSIS — B952 Enterococcus as the cause of diseases classified elsewhere: Secondary | ICD-10-CM | POA: Diagnosis not present

## 2020-07-22 DIAGNOSIS — L89154 Pressure ulcer of sacral region, stage 4: Secondary | ICD-10-CM | POA: Diagnosis present

## 2020-07-22 DIAGNOSIS — E43 Unspecified severe protein-calorie malnutrition: Secondary | ICD-10-CM | POA: Diagnosis not present

## 2020-07-22 DIAGNOSIS — T148XXD Other injury of unspecified body region, subsequent encounter: Secondary | ICD-10-CM | POA: Diagnosis not present

## 2020-07-22 DIAGNOSIS — N179 Acute kidney failure, unspecified: Secondary | ICD-10-CM | POA: Diagnosis not present

## 2020-07-22 DIAGNOSIS — J9811 Atelectasis: Secondary | ICD-10-CM

## 2020-07-22 DIAGNOSIS — E860 Dehydration: Secondary | ICD-10-CM | POA: Diagnosis not present

## 2020-07-22 DIAGNOSIS — I96 Gangrene, not elsewhere classified: Secondary | ICD-10-CM

## 2020-07-22 DIAGNOSIS — Z8781 Personal history of (healed) traumatic fracture: Secondary | ICD-10-CM

## 2020-07-22 DIAGNOSIS — B9689 Other specified bacterial agents as the cause of diseases classified elsewhere: Secondary | ICD-10-CM

## 2020-07-22 DIAGNOSIS — G9341 Metabolic encephalopathy: Secondary | ICD-10-CM | POA: Diagnosis not present

## 2020-07-22 DIAGNOSIS — I959 Hypotension, unspecified: Secondary | ICD-10-CM | POA: Diagnosis not present

## 2020-07-22 DIAGNOSIS — B9562 Methicillin resistant Staphylococcus aureus infection as the cause of diseases classified elsewhere: Secondary | ICD-10-CM | POA: Diagnosis not present

## 2020-07-22 HISTORY — PX: DEBRIDMENT OF DECUBITUS ULCER: SHX6276

## 2020-07-22 LAB — GLUCOSE, CAPILLARY
Glucose-Capillary: 102 mg/dL — ABNORMAL HIGH (ref 70–99)
Glucose-Capillary: 105 mg/dL — ABNORMAL HIGH (ref 70–99)
Glucose-Capillary: 107 mg/dL — ABNORMAL HIGH (ref 70–99)
Glucose-Capillary: 131 mg/dL — ABNORMAL HIGH (ref 70–99)
Glucose-Capillary: 93 mg/dL (ref 70–99)
Glucose-Capillary: 95 mg/dL (ref 70–99)

## 2020-07-22 LAB — CBC WITH DIFFERENTIAL/PLATELET
Abs Immature Granulocytes: 0.17 10*3/uL — ABNORMAL HIGH (ref 0.00–0.07)
Basophils Absolute: 0 10*3/uL (ref 0.0–0.1)
Basophils Relative: 0 %
Eosinophils Absolute: 0.2 10*3/uL (ref 0.0–0.5)
Eosinophils Relative: 2 %
HCT: 33.7 % — ABNORMAL LOW (ref 36.0–46.0)
Hemoglobin: 10.5 g/dL — ABNORMAL LOW (ref 12.0–15.0)
Immature Granulocytes: 2 %
Lymphocytes Relative: 14 %
Lymphs Abs: 1.5 10*3/uL (ref 0.7–4.0)
MCH: 27.9 pg (ref 26.0–34.0)
MCHC: 31.2 g/dL (ref 30.0–36.0)
MCV: 89.6 fL (ref 80.0–100.0)
Monocytes Absolute: 0.7 10*3/uL (ref 0.1–1.0)
Monocytes Relative: 7 %
Neutro Abs: 7.7 10*3/uL (ref 1.7–7.7)
Neutrophils Relative %: 75 %
Platelets: 278 10*3/uL (ref 150–400)
RBC: 3.76 MIL/uL — ABNORMAL LOW (ref 3.87–5.11)
RDW: 18.6 % — ABNORMAL HIGH (ref 11.5–15.5)
WBC: 10.3 10*3/uL (ref 4.0–10.5)
nRBC: 0 % (ref 0.0–0.2)

## 2020-07-22 LAB — MAGNESIUM: Magnesium: 1.8 mg/dL (ref 1.7–2.4)

## 2020-07-22 LAB — BASIC METABOLIC PANEL
Anion gap: 16 — ABNORMAL HIGH (ref 5–15)
BUN: 70 mg/dL — ABNORMAL HIGH (ref 8–23)
CO2: 24 mmol/L (ref 22–32)
Calcium: 10 mg/dL (ref 8.9–10.3)
Chloride: 108 mmol/L (ref 98–111)
Creatinine, Ser: 1.82 mg/dL — ABNORMAL HIGH (ref 0.44–1.00)
GFR, Estimated: 26 mL/min — ABNORMAL LOW (ref >60)
Glucose, Bld: 107 mg/dL — ABNORMAL HIGH (ref 70–99)
Potassium: 3.5 mmol/L (ref 3.5–5.1)
Sodium: 148 mmol/L — ABNORMAL HIGH (ref 135–145)

## 2020-07-22 SURGERY — DEBRIDMENT OF DECUBITUS ULCER
Anesthesia: General

## 2020-07-22 MED ORDER — SODIUM CHLORIDE 0.9 % IV SOLN
INTRAVENOUS | Status: DC | PRN
  Administered 2020-07-22: 50 mL

## 2020-07-22 MED ORDER — PROPOFOL 10 MG/ML IV BOLUS
INTRAVENOUS | Status: AC
  Filled 2020-07-22: qty 20

## 2020-07-22 MED ORDER — BUPIVACAINE HCL (PF) 0.5 % IJ SOLN
INTRAMUSCULAR | Status: AC
  Filled 2020-07-22: qty 30

## 2020-07-22 MED ORDER — ONDANSETRON HCL 4 MG/2ML IJ SOLN: 4.0000 mg | Freq: Once | INTRAMUSCULAR | Status: DC | PRN

## 2020-07-22 MED ORDER — LIDOCAINE HCL (CARDIAC) PF 100 MG/5ML IV SOSY
PREFILLED_SYRINGE | INTRAVENOUS | Status: DC | PRN
  Administered 2020-07-22: 100 mg via INTRAVENOUS

## 2020-07-22 MED ORDER — TRAMADOL HCL 50 MG PO TABS
50.0000 mg | ORAL_TABLET | Freq: Four times a day (QID) | ORAL | Status: DC | PRN
  Administered 2020-07-23: 50 mg via ORAL
  Filled 2020-07-22: qty 1

## 2020-07-22 MED ORDER — ROCURONIUM BROMIDE 100 MG/10ML IV SOLN
INTRAVENOUS | Status: DC | PRN
  Administered 2020-07-22: 50 mg via INTRAVENOUS

## 2020-07-22 MED ORDER — ACETAMINOPHEN 500 MG PO TABS
1000.0000 mg | ORAL_TABLET | Freq: Four times a day (QID) | ORAL | Status: DC | PRN
  Administered 2020-08-01: 1000 mg via ORAL
  Filled 2020-07-22: qty 2

## 2020-07-22 MED ORDER — CHLORHEXIDINE GLUCONATE CLOTH 2 % EX PADS
6.0000 | MEDICATED_PAD | Freq: Every day | CUTANEOUS | Status: AC
  Administered 2020-07-22 – 2020-07-23 (×2): 6 via TOPICAL

## 2020-07-22 MED ORDER — SUGAMMADEX SODIUM 200 MG/2ML IV SOLN
INTRAVENOUS | Status: DC | PRN
  Administered 2020-07-22: 200 mg via INTRAVENOUS

## 2020-07-22 MED ORDER — EPINEPHRINE PF 1 MG/ML IJ SOLN
INTRAMUSCULAR | Status: AC
  Filled 2020-07-22: qty 1

## 2020-07-22 MED ORDER — BUPIVACAINE LIPOSOME 1.3 % IJ SUSP
INTRAMUSCULAR | Status: AC
  Filled 2020-07-22: qty 20

## 2020-07-22 MED ORDER — SODIUM CHLORIDE 0.9 % IV SOLN
INTRAVENOUS | Status: DC | PRN
  Administered 2020-07-22: 50 ug/min via INTRAVENOUS

## 2020-07-22 MED ORDER — FENTANYL CITRATE (PF) 100 MCG/2ML IJ SOLN
INTRAMUSCULAR | Status: AC
  Filled 2020-07-22: qty 2

## 2020-07-22 MED ORDER — FENTANYL CITRATE (PF) 100 MCG/2ML IJ SOLN: 25.0000 ug | INTRAMUSCULAR | Status: DC | PRN

## 2020-07-22 MED ORDER — COLLAGENASE 250 UNIT/GM EX OINT
TOPICAL_OINTMENT | Freq: Every day | CUTANEOUS | Status: DC
  Filled 2020-07-22: qty 30

## 2020-07-22 MED ORDER — DEXAMETHASONE SODIUM PHOSPHATE 10 MG/ML IJ SOLN
INTRAMUSCULAR | Status: DC | PRN
  Administered 2020-07-22: 5 mg via INTRAVENOUS

## 2020-07-22 MED ORDER — LACTATED RINGERS IV SOLN: INTRAVENOUS | Status: DC | PRN

## 2020-07-22 MED ORDER — DAKINS (1/4 STRENGTH) 0.125 % EX SOLN
CUTANEOUS | Status: AC
  Filled 2020-07-22: qty 473

## 2020-07-22 MED ORDER — PHENYLEPHRINE HCL (PRESSORS) 10 MG/ML IV SOLN
INTRAVENOUS | Status: DC | PRN
  Administered 2020-07-22: 200 ug via INTRAVENOUS

## 2020-07-22 MED ORDER — FENTANYL CITRATE (PF) 100 MCG/2ML IJ SOLN
INTRAMUSCULAR | Status: DC | PRN
  Administered 2020-07-22: 50 ug via INTRAVENOUS

## 2020-07-22 MED ORDER — PROPOFOL 10 MG/ML IV BOLUS
INTRAVENOUS | Status: DC | PRN
  Administered 2020-07-22 (×2): 20 mg via INTRAVENOUS

## 2020-07-22 SURGICAL SUPPLY — 34 items
BNDG GAUZE 4.5X4.1 6PLY STRL (MISCELLANEOUS) ×2 IMPLANT
CANISTER SUCT 1200ML W/VALVE (MISCELLANEOUS) ×2 IMPLANT
CANISTER WOUND CARE 500ML ATS (WOUND CARE) ×2 IMPLANT
CHLORAPREP W/TINT 26 (MISCELLANEOUS) IMPLANT
CNTNR SPEC 2.5X3XGRAD LEK (MISCELLANEOUS) ×4
CONT SPEC 4OZ STER OR WHT (MISCELLANEOUS) ×4
CONT SPEC 4OZ STRL OR WHT (MISCELLANEOUS) ×4
CONTAINER SPEC 2.5X3XGRAD LEK (MISCELLANEOUS) ×4 IMPLANT
COVER WAND RF STERILE (DRAPES) ×2 IMPLANT
DRAIN PENROSE 1/4X12 LTX STRL (WOUND CARE) ×2 IMPLANT
DRAPE LAPAROTOMY 77X122 PED (DRAPES) ×2 IMPLANT
DRSG GAUZE FLUFF 36X18 (GAUZE/BANDAGES/DRESSINGS) ×2 IMPLANT
DRSG VAC ATS LRG SENSATRAC (GAUZE/BANDAGES/DRESSINGS) ×2 IMPLANT
ELECT REM PT RETURN 9FT ADLT (ELECTROSURGICAL) ×2
ELECTRODE REM PT RTRN 9FT ADLT (ELECTROSURGICAL) ×1 IMPLANT
GAUZE SPONGE 4X4 12PLY STRL (GAUZE/BANDAGES/DRESSINGS) ×2 IMPLANT
GLOVE SURG SYN 7.0 (GLOVE) ×2 IMPLANT
GLOVE SURG SYN 7.5  E (GLOVE) ×2
GLOVE SURG SYN 7.5 E (GLOVE) ×1 IMPLANT
GOWN STRL REUS W/ TWL LRG LVL3 (GOWN DISPOSABLE) ×2 IMPLANT
GOWN STRL REUS W/TWL LRG LVL3 (GOWN DISPOSABLE) ×4
KIT TURNOVER KIT A (KITS) ×2 IMPLANT
LABEL OR SOLS (LABEL) ×2 IMPLANT
MANIFOLD NEPTUNE II (INSTRUMENTS) ×2 IMPLANT
NEEDLE HYPO 22GX1.5 SAFETY (NEEDLE) ×2 IMPLANT
NS IRRIG 500ML POUR BTL (IV SOLUTION) ×2 IMPLANT
PACK BASIN MINOR ARMC (MISCELLANEOUS) ×2 IMPLANT
PAD ABD DERMACEA PRESS 5X9 (GAUZE/BANDAGES/DRESSINGS) ×2 IMPLANT
SOL PREP PVP 2OZ (MISCELLANEOUS) ×2
SOLUTION PREP PVP 2OZ (MISCELLANEOUS) ×1 IMPLANT
SPONGE LAP 18X18 RF (DISPOSABLE) ×4 IMPLANT
SWAB CULTURE AMIES ANAERIB BLU (MISCELLANEOUS) ×4 IMPLANT
SYR 20ML LL LF (SYRINGE) ×2 IMPLANT
SYR BULB IRRIG 60ML STRL (SYRINGE) ×2 IMPLANT

## 2020-07-22 NOTE — Transfer of Care (Signed)
Immediate Anesthesia Transfer of Care Note  Patient: [name]  Procedure(s) Performed: DEBRIDMENT OF DECUBITUS ULCER (N/A )  Patient Location: PACU  Anesthesia Type:General  Level of Consciousness: awake and drowsy  Airway & Oxygen Therapy: Patient Spontanous Breathing and Patient connected to face mask oxygen  Post-op Assessment: Report given to RN and Post -op Vital signs reviewed and stable  Post vital signs: stable  Last Vitals:  Vitals Value Taken Time  BP 123/54 07/22/20 1546  Temp    Pulse 74 07/22/20 1552  Resp 13 07/22/20 1552  SpO2 96 % 07/22/20 1552  Vitals shown include unvalidated device data.  Last Pain:  Vitals:   07/22/20 1318  TempSrc: Tympanic  PainSc: 0-No pain         Complications: No complications documented.

## 2020-07-22 NOTE — TOC Progression Note (Addendum)
Transition of Care [facility]) - Progression Note    Patient Details  Name: [name] MRN: [id number] Date of Birth: 1930/04/02  Transition of Care [facility]) CM/SW [place], LCSW Phone Number: 07/22/2020, 10:15 AM  Clinical Narrative:  [facility] declined. CSW called patient's son [name] to notify. Told him that [facility] in [place] and [facility] in [place] had offered. Sent him a text message with facility information for those that had offered and that were still pending in the [facility] area so he can look into them.  10:52 am: [place] has also offered a bed. Son has accepted this bed offer. CSW called and notified admissions coordinator.  Expected Discharge Plan: Skilled Nursing Facility Barriers to Discharge: Continued Medical Work up  Expected Discharge Plan and Services Expected Discharge Plan: [place]                                               Social Determinants of Health (SDOH) Interventions    Readmission Risk Interventions Readmission Risk Prevention Plan 07/20/2020  Transportation Screening Complete  Social Work Consult for [place] Planning/Counseling Complete  Medication Review Press photographer) Complete  Some recent data might be hidden

## 2020-07-22 NOTE — Progress Notes (Addendum)
[place] Hospital Day(s): 4.   Interval History:  Patient seen and examined No acute events or new complaints overnight.  Patient with history of dementia, unable to participate much in interview/examination Labs are pending this morning She continues on Unasyn; ID following Plan for debridement in the OR with possible wound vac placement this afternoon   Vital signs in last 24 hours: [min-max] current  Temp:  [97.6 F (36.4 C)-98.6 F (37 C)] 98.3 F (36.8 C) (02/09 0422) Pulse Rate:  [82-98] 98 (02/09 0422) Resp:  [16-22] 16 (02/09 0422) BP: (130-164)/(40-139) 130/50 (02/09 0422) SpO2:  [98 %-100 %] 100 % (02/09 0422)     Height: 5\' 5"  (165.1 cm) Weight: 98 kg BMI (Calculated): 35.95   Intake/Output last 2 shifts:  02/08 0701 - 02/09 0700 In: 360 [P.O.:360] Out: 1300 [Urine:1300]   Physical Exam:  Constitutional: alert, cooperative and no distress  Respiratory: breathing non-labored at rest  Integumentary: Large sacral wound, 15  X 5 cm, 100% of wound bed covered with eschar   Labs:  CBC Latest Ref Rng & Units 07/19/2020 07/18/2020 04/22/2020  WBC 4.0 - 10.5 K/uL 9.1 11.9(H) 6.1  Hemoglobin 12.0 - 15.0 g/dL 10.0(L) 10.1(L) 11.9(L)  Hematocrit 36.0 - 46.0 % 34.0(L) 32.6(L) 40.8  Platelets 150 - 400 K/uL 310 320 312   CMP Latest Ref Rng & Units 07/21/2020 07/20/2020 07/19/2020  Glucose 70 - 99 mg/dL 89 100(H) 102(H)  BUN 8 - 23 mg/dL 95(H) 111(H) 128(H)  Creatinine 0.44 - 1.00 mg/dL 2.44(H) 3.01(H) 3.93(H)  Sodium 135 - 145 mmol/L 147(H) 148(H) 139  Potassium 3.5 - 5.1 mmol/L 3.9 4.5 5.4(H)  Chloride 98 - 111 mmol/L 110 108 102  CO2 22 - 32 mmol/L 24 26 25   Calcium 8.9 - 10.3 mg/dL 9.6 10.1 10.8(H)  Total Protein 6.5 - 8.1 g/dL - - 6.9  Total Bilirubin 0.3 - 1.2 mg/dL - - 0.8  Alkaline Phos 38 - 126 U/L - - 85  AST 15 - 41 U/L - - 28  ALT 0 - 44 U/L - - 15     Imaging studies: No new pertinent imaging  studies   Assessment/Plan: (ICD-10's: L89.150) 87 y.o. female admitted with AKI and VRE UTI found to have large unstageable sacral pressure injury, complicated by pertinent comorbidities including advanced age, frailty, and dementia.   - Plan for formal debridement with wound vac placement in the OR with Dr Hampton Abbot pending OR/Anesthesia availability  - All risks, benefits, and alternatives to above procedure(s) were discussed with the patient's son via telephone, all of his questions were answered to his expressed satisfaction, patient expresses he wishes to proceed, and informed consent was obtained.   - Continue ABx (Unasyn)             - Pain control prn             - Recommend continue local wound care, frequent repositioning +/- low air loss mattress              - Further management per primary service    All of the above findings and recommendations were discussed with the medical team.  -- Marie Simon, PA-C [facility] 07/22/2020, 7:37 AM [phone] M-F: 7am - 4pm

## 2020-07-22 NOTE — Progress Notes (Signed)
Physical Therapy Treatment Patient Details Name: [name] MRN: [id number] DOB: August 28, 1929 Today's Date: 07/22/2020    History of Present Illness 87 y.o. female with medical history significant of dementia, diastolic CHF s/p Lumbar fusion (Oct 2021) due to fall and L3 fx, anemia, atrial flutter, breast cancer status post radiation therapy, CKD stage III, history of CVA, history of DVT left leg postoperatively, esophageal stricture, GERD,  C. difficile infection, left foot pressure ulcer, severe osteopenia, falls.  Pt noted with small amount of acute intraventricular hemorrhage on 2/7 imaging (no recent traumatic event noted); Neurosurgery consulted.    PT Comments    PT/OT co-treat performed.  Pt resting in bed upon PT arrival; oriented to person and place only; pt appearing confused during session.  Pt requiring max to total assist x2 to sit up on edge of bed (total assist to scoot pt forward towards edge of bed in sitting using bed sheets to prevent shearing on pt's bottom).  Pt vocalizing pain in sitting but unable to verbalize where (pt leaning/pushing towards R requiring assist for upright; may be sacral wound pain related; per chart plan for sacral wound debridement today). Pt's pain did not improve with repositioning and pt's HR increasing from 90-100 bpm at rest to 145 bpm in sitting so pt assisted back to laying down in bed.  Pt repositioned for comfort and pt appearing comfortable in bed end of session.  Pt's HR improved to around 90 bpm end of session.  Will continue to trial pt with PT 1-2 more sessions and update POC as deemed appropriate.   Follow Up Recommendations  SNF     Equipment Recommendations  3in1 (PT);Wheelchair (measurements PT);Wheelchair cushion (measurements PT);Hospital bed;Other (comment) (hoyer lift)    Recommendations for Other Services OT consult     Precautions / Restrictions Precautions Precautions: Fall Precaution Comments: L heel and sacral  wound Restrictions Weight Bearing Restrictions: No    Mobility  Bed Mobility Overal bed mobility: Needs Assistance Bed Mobility: Supine to Sit;Sit to Supine     Supine to sit: Max assist;Total assist;+2 for physical assistance Sit to supine: Max assist;Total assist;+2 for physical assistance   General bed mobility comments: assist for trunk and B LE's; vc's for technique  Transfers                 General transfer comment: unsafe to attempt  Ambulation/Gait             General Gait Details: unsafe to attempt   Stairs             Wheelchair Mobility    Modified Rankin (Stroke Patients Only)       Balance Overall balance assessment: Needs assistance Sitting-balance support: Bilateral upper extremity supported;Feet unsupported Sitting balance-Leahy Scale: Poor Sitting balance - Comments: pt with R lean/push in sitting requiring max assist x1 plus min assist x1 for safe sitting balance                                    Cognition Arousal/Alertness: Awake/alert Behavior During Therapy: Anxious Overall Cognitive Status: No family/caregiver present to determine baseline cognitive functioning                                 General Comments: Oriented to name, DOB, and hospital; pt unable to verbalize situation; pt reporting it was  January but not oriented to year; general confusion noted during session (pt reporting getting up walking this morning)      Exercises      General Comments   Nursing cleared pt for participation in physical therapy.  Pt agreeable to PT session.      Pertinent Vitals/Pain Pain Assessment: Faces Faces Pain Scale: Hurts a little bit (8/10 sitting edge of bed; 2/10 resting in bed) Pain Location: unable to state Pain Descriptors / Indicators: Grimacing;Moaning;Discomfort (pt vocalizing in pain (resolved once laying down)) Pain Intervention(s): Limited activity within patient's  tolerance;Monitored during session;Repositioned  O2 sats WFL on room air    Home Living                      Prior Function            PT Goals (current goals can now be found in the care plan section) Acute Rehab PT Goals Patient Stated Goal: none stated PT Goal Formulation: With patient Time For Goal Achievement: 08/02/20 Potential to Achieve Goals: Fair Progress towards PT goals: Progressing toward goals    Frequency    Min 2X/week (trial physical therapy 1-2 more session)      PT Plan Other (comment) (will trial pt 1-2 more sessions (for physical therapy))    Co-evaluation PT/OT/SLP Co-Evaluation/Treatment: Yes Reason for Co-Treatment: Necessary to address cognition/behavior during functional activity;For patient/therapist safety;To address functional/ADL transfers PT goals addressed during session: Mobility/safety with mobility;Balance OT goals addressed during session: ADL's and self-care      AM-PAC PT "6 Clicks" Mobility   Outcome Measure  Help needed turning from your back to your side while in a flat bed without using bedrails?: Total Help needed moving from lying on your back to sitting on the side of a flat bed without using bedrails?: Total Help needed moving to and from a bed to a chair (including a wheelchair)?: Total Help needed standing up from a chair using your arms (e.g., wheelchair or bedside chair)?: Total Help needed to walk in hospital room?: Total Help needed climbing 3-5 steps with a railing? : Total 6 Click Score: 6    End of Session   Activity Tolerance: Patient limited by pain Patient left: in bed;with call bell/phone within reach;with bed alarm set;Other (comment) (B prevalon boots donned; pt positioned towards R sidelying to offload sacral wound) Nurse Communication: Mobility status;Precautions;Other (comment) (pt's HR during session) PT Visit Diagnosis: Muscle weakness (generalized) (M62.81);Difficulty in walking, not  elsewhere classified (R26.2)     Time: 8638-1771 PT Time Calculation (min) (ACUTE ONLY): 30 min  Charges:  $Therapeutic Activity: 8-22 mins                    Leitha Bleak, PT 07/22/20, 12:57 PM

## 2020-07-22 NOTE — Consult Note (Signed)
WOC Nurse Consult Note: Reason for Consult: Stage 4 pressure injury to sacrum. Debrided in OR today (Dr. Hampton Abbot) with placement of NPWT.  Request for M/W/F NPWT dressing changes by [facility] nurse beginning on Friday, 07/24/20. Wound type:Pressure Pressure Injury POA: Yes Dressing procedure/placement/frequency: I have provided a mattress replacement with low air loss feature today and bilateral pressure redistribution heel boots. Will see Friday for NPWT dressing change.  [place] nursing team will follow, and will remain available to this patient, the nursing, surgical and medical teams.   Thanks, Maudie Flakes, MSN, RN, [place], Arther Abbott  Pager# [phone]

## 2020-07-22 NOTE — Plan of Care (Signed)
PMT note:  Patient is interactive, but confused and unable to answer questions. Attempted to reach son [name] (as spouse requested this yesterday) at both numbers provided unsuccessfully. VM left. Will reattempt at another time. Patient scheduled for sacral wound debridement and VAC placement this afternoon.

## 2020-07-22 NOTE — Op Note (Signed)
  Procedure Date:  07/22/2020  Pre-operative Diagnosis:  Unstageable sacral decubitus wound  Post-operative Diagnosis: Stage IV sacral decubitus wound  Procedure:  Debridement of sacral decubitus wound consisting of skin, subcutaneous tissue, muscle, and bone.  Total wound area is 16 x 8 cm = 128 sqcm.  Surgeon:  Melvyn Neth, MD  Anesthesia:  General endotracheal  Estimated Blood Loss:  15 ml  Specimens:  Sacral wound tissue for culture; sacral bone tissue for culture.  Complications:  None  Indications for Procedure:  This is a 87 y.o. female with diagnosis of an unstageable sacral decubitus wound, requiring debridement.  The risks of bleeding, abscess or infection, injury to surrounding structures, and need for further procedures were all discussed with the patient's son Kaylor Maiers and was willing to proceed.  Description of Procedure: The patient was correctly identified in the preoperative area and brought into the operating room.  The patient was placed supine with VTE prophylaxis in place.  Appropriate time-outs were performed.  Anesthesia was induced and the patient was intubated.  Appropriate antibiotics were infused.  The patient was then placed in right lateral decubitus position.  The patient's sacral area was prepped and draped in usual sterile fashion.  Using bovie cautery, I proceeded to do extensive debridement of the sacral wound, including skin, subcutaneous tissue, muscle, and bone.  This was sent to micro for tissue culture.  Overall the wound measured at the end 16 cm x 8 cm.  The depth of the wound reached the lower sacrum.  Rongeur was used to obtain bone culture specimen, and the bone was very soft, consistent with osteomyelitis.  After debridement was completed, the cavity was irrigated and cleaned, and cautery was used for hemostasis.  50 ml of Exparel solution combined with 0.5% bupivacaine with epi was infiltrated around the wound.  The wound was then dressed with  black sponge negative pressure dressing, bridged anteriorly to the left superior thigh.  The patient tolerated the procedure well and all counts were correct at the end of the case.   Melvyn Neth, MD

## 2020-07-22 NOTE — Anesthesia Procedure Notes (Signed)
Procedure Name: Intubation Date/Time: 07/22/2020 2:10 PM Performed by: Natasha Mead, CRNA Pre-anesthesia Checklist: Patient identified, Emergency Drugs available, Suction available and Patient being monitored Patient Re-evaluated:Patient Re-evaluated prior to induction Oxygen Delivery Method: Circle system utilized Preoxygenation: Pre-oxygenation with 100% oxygen Induction Type: IV induction Ventilation: Mask ventilation without difficulty Laryngoscope Size: McGraph and 3 Grade View: Grade I Tube type: Oral Tube size: 7.5 mm Number of attempts: 1 Airway Equipment and Method: Stylet and Oral airway Placement Confirmation: ETT inserted through vocal cords under direct vision,  positive ETCO2 and breath sounds checked- equal and bilateral Secured at: 22 cm Tube secured with: Tape Dental Injury: Teeth and Oropharynx as per pre-operative assessment

## 2020-07-22 NOTE — Progress Notes (Signed)
PROGRESS NOTE    [name]  OAC:[id number] DOB: 11-18-1929 DOA: 07/18/2020 PCP: Biagio Borg, MD   Brief Narrative:  [name] Coveyis a 87 y.o.femalewith medical history significant of dementia, diastolic CHF s/p Lumbar fusion due to L3 frx and repair of traumatic CSF leakOct 2021,anemia, atrial flutter, breast cancer status post radiation therapy, CKD stage III, history of CVA, history of DVT left leg postoperatively, is a esophageal stricture,GERD, C. difficile infection,left foot pressure ulcer,severe osteopenia,falls.Presented withHypotension initial BP at facility down to 96/34 EMS gave 500 ml bolus and BP was 131/48 Per nursing staff patient have had decreased urine output. She has been somnolent/confused while here. She is found with AKI, Ucx+, and PNA . Ct head obtained +for acute intrav. Bleed, but trace. Neurosurgery was consulted. Son [name] was notified.  Consulted palliative care. Family wants to keep her full code for now.ID consulted.  2/6-confused, quiet.  2/7-sitting up in bed, still confused, mumbling something. (spoke to son, states since October she has been "mumbling" and not being her self. Prior to October (before surgery) pt apparently was active and sharp. 2/8-somnolent today. Does not open eyes for me. Per nsg does take meds . CT head obtained-see result 2/9: Patient more interactive.  Nonsensical speech.  Does not appear in any distress.  Patient n.p.o. for surgical debridement today.  Remains on IV antibiotics.   Assessment & Plan:   Active Problems:   Diabetes ([place])   Hyperlipidemia   Essential hypertension   Malignant neoplasm of upper-outer quadrant of right breast in female, estrogen receptor positive ([place])   Wound healing, delayed   AKI (acute kidney injury) ([place])   CKD (chronic kidney disease) stage 3, GFR 30-59 ml/min ([name])   Chronic diastolic CHF (congestive heart failure) ([name])   Protein-calorie malnutrition, severe ([name])    Dehydration   Hyperkalemia   CAP (community acquired pneumonia)   Acute metabolic encephalopathy  Acute metabolic encephalopathy  most likely multifactorial secondary to combination of infection ,dehydration, AKI, and acute intraventricular hemorrhage.  -Baseline mental status difficult to ascertain given underlying dementia -Per neurosurgery-no further inpatient intervention necessary Plan: Continue intravenous fluids for now while n.p.o. Continue Unasyn Frequent neuro checks Surgical debridement today, consent obtained from family  UTI +UA and ucx with Enter faeca.  Vancomycin-resistant Enterococcus isolated. ID consulted. Per ID Enterococcus is likely is contaminant Confirmed with RN, Foley was exchanged on admission including tubing and bag Plan: Continue Unasyn per infectious disease recommendations   Possible pneumonia, unlikely Per ID chest x-ray likely represents atelectasis White blood cell high level of normal Procalcitonin trending down Plan: Continue Unasyn as above Oxygen as needed  Acute intraventricular hemorrhage-Trace .  Neurosurgery consulted Recommended repeat CTA head Will pursue after surgical intervention   Sacral decubitus ulcer Formal debridement plan in OR with Dr. Hampton Abbot  Consent obtained from family  Plan:  Continue Unasyn  Wound care  Pain control as needed   Protein-calorie malnutrition, severe  Speech and RD following  Hyperlipidemia -chronic stable not on statin statin  Essential hypertension hold home medications given hypotension on admission  Acute renal failure on chronic kidney disease stage IIIa F/u renal US-renal medical disease no hydronephrosis -chronic avoid nephrotoxic medications such as NSAIDs, Vanco Zosyn combo, avoid hypotension Monitor levels  Chronic diastolic CHF (congestive heart failure) ([name])currently appears to be in a dry side Hold lasix  AKI(acute kidney injury) ([place]) -appears to be  prerenal-dehydration, on ACE and Lasix.   Hold lisinopril rehydrate and continue to follow  RENAL US showing no evidence of hydronephrosis 2/8- improving creatinine, today 1.82 Continue to monitor   Dehydration-continue with IV fluid  DM2--controlled A1c 5.6 TSH 5.4, will need recheck in 4 weeks when acute phase illness improves    DVT prophylaxis: SCD Code Status: Full Family Communication: None today.  Attempted to call patient's son.  No answer, no voicemail left Disposition Plan: Status is: Inpatient  Remains inpatient appropriate because:Inpatient level of care appropriate due to severity of illness   Dispo: The patient is from: Home              Anticipated d/c is to: SNF              Anticipated d/c date is: > 3 days              Patient currently is not medically stable to d/c.   Difficult to place patient No  OR with general surgery today for sacral decubitus ulcer debridement     Level of care: Med-Surg  Consultants:   General surgery  Palliative care  Procedures:   Surgical debridement of sacral decubitus ulcer, 07/22/2020  Antimicrobials:   Unasyn   Subjective: Seen and examined.  Interactive but nonsensical speech.  Appears in no visible distress dressed.  Objective: Vitals:   07/22/20 0002 07/22/20 0422 07/22/20 0854 07/22/20 1211  BP: (!) 138/40 (!) 130/50 (!) 134/58 (!) 130/42  Pulse:  98 88 71  Resp: 16 16 18 16   Temp: 98.6 F (37 C) 98.3 F (36.8 C) 97.7 F (36.5 C) (!) 97.4 F (36.3 C)  TempSrc: Axillary Axillary Oral Oral  SpO2: 98% 100%    Weight:      Height:        Intake/Output Summary (Last 24 hours) at 07/22/2020 1226 Last data filed at 07/22/2020 0900 Gross per 24 hour  Intake 760 ml  Output 800 ml  Net -40 ml   Filed Weights   07/18/20 1603  Weight: 98 kg    Examination:  General exam: No acute distress.  Confused.  Frail Respiratory system: Bibasilar crackles.  Normal work of breathing.  Room  air Cardiovascular system: S1-S2, regular rate and rhythm,+murmur Gastrointestinal system: Soft, nontender, nondistended, normal bowel sounds Central nervous system: Alert, confused, oriented x1, no focal deficits Extremities: Symmetric 5 x 5 power. Skin: No rashes, lesions or ulcers Psychiatry: Judgement and insight appear impaired. Mood & affect flattened.     Data Reviewed: I have personally reviewed following labs and imaging studies  CBC: Recent Labs  Lab 07/18/20 1554 07/19/20 0311 07/22/20 0751  WBC 11.9* 9.1 10.3  NEUTROABS 6.7 7.1 7.7  HGB 10.1* 10.0* 10.5*  HCT 32.6* 34.0* 33.7*  MCV 90.6 92.4 89.6  PLT 320 310 371   Basic Metabolic Panel: Recent Labs  Lab 07/18/20 2342 07/19/20 0311 07/20/20 0424 07/21/20 0421 07/22/20 0751  NA 139 139 148* 147* 148*  K 5.3* 5.4* 4.5 3.9 3.5  CL 102 102 108 110 108  CO2 25 25 26 24 24   GLUCOSE 130* 102* 100* 89 107*  BUN 129* 128* 111* 95* 70*  CREATININE 3.94* 3.93* 3.01* 2.44* 1.82*  CALCIUM 10.6* 10.8* 10.1 9.6 10.0  MG 2.6* 2.6*  --   --  1.8  PHOS  --  6.4*  --   --   --    GFR: Estimated Creatinine Clearance: 24.8 mL/min (A) (by C-G formula based on SCr of 1.82 mg/dL (H)). Liver Function Tests: Recent Labs  Lab 07/18/20  1554 07/19/20 0311  AST 24 28  ALT 14 15  ALKPHOS 83 85  BILITOT 0.7 0.8  PROT 7.0 6.9  ALBUMIN 2.4* 2.4*   No results for input(s): LIPASE, AMYLASE in the last 168 hours. No results for input(s): AMMONIA in the last 168 hours. Coagulation Profile: No results for input(s): INR, PROTIME in the last 168 hours. Cardiac Enzymes: Recent Labs  Lab 07/18/20 2342 07/19/20 0311  CKTOTAL 373* 460*   BNP (last 3 results) No results for input(s): PROBNP in the last 8760 hours. HbA1C: No results for input(s): HGBA1C in the last 72 hours. CBG: Recent Labs  Lab 07/21/20 2059 07/21/20 2359 07/22/20 0419 07/22/20 0753 07/22/20 1206  GLUCAP 122* 107* 102* 95 105*   Lipid Profile: No  results for input(s): CHOL, HDL, LDLCALC, TRIG, CHOLHDL, LDLDIRECT in the last 72 hours. Thyroid Function Tests: No results for input(s): TSH, T4TOTAL, FREET4, T3FREE, THYROIDAB in the last 72 hours. Anemia Panel: No results for input(s): VITAMINB12, FOLATE, FERRITIN, TIBC, IRON, RETICCTPCT in the last 72 hours. Sepsis Labs: Recent Labs  Lab 07/18/20 1554 07/18/20 2105 07/19/20 0311 07/20/20 0424  PROCALCITON 0.51  --  0.46 0.38  LATICACIDVEN 1.0 0.9  --   --     Recent Results (from the past 240 hour(s))  Blood Culture (routine x 2)     Status: None (Preliminary result)   Collection Time: 07/18/20  4:30 PM   Specimen: BLOOD  Result Value Ref Range Status   Specimen Description BLOOD BLOOD RIGHT FOREARM  Final   Special Requests   Final    BOTTLES DRAWN AEROBIC AND ANAEROBIC Blood Culture adequate volume   Culture   Final    NO GROWTH 4 DAYS Performed at [facility], [address], [place], [place] [address]    Report Status PENDING  Incomplete  SARS Coronavirus 2 by RT PCR (hospital order, performed in [place] hospital lab) Nasopharyngeal Nasopharyngeal Swab     Status: None   Collection Time: 07/18/20  5:39 PM   Specimen: Nasopharyngeal Swab  Result Value Ref Range Status   SARS Coronavirus 2 NEGATIVE NEGATIVE Final    Comment: (NOTE) SARS-CoV-2 target nucleic acids are NOT DETECTED.  The SARS-CoV-2 RNA is generally detectable in upper and lower respiratory specimens during the acute phase of infection. The lowest concentration of SARS-CoV-2 viral copies this assay can detect is 250 copies / mL. A negative result does not preclude SARS-CoV-2 infection and should not be used as the sole basis for treatment or other patient management decisions.  A negative result may occur with improper specimen collection / handling, submission of specimen other than nasopharyngeal swab, presence of viral mutation(s) within the areas targeted by this assay, and  inadequate number of viral copies (<250 copies / mL). A negative result must be combined with clinical observations, patient history, and epidemiological information.  Fact Sheet for Patients:   StrictlyIdeas.no  Fact Sheet for Healthcare Providers: BankingDealers.co.za  This test is not yet approved or  cleared by the [place] FDA and has been authorized for detection and/or diagnosis of SARS-CoV-2 by FDA under an Emergency Use Authorization (EUA).  This EUA will remain in effect (meaning this test can be used) for the duration of the COVID-19 declaration under Section 564(b)(1) of the Act, 21 U.S.C. section 360bbb-3(b)(1), unless the authorization is terminated or revoked sooner.  Performed at [facility], [address], [place], [place] [address]   Blood Culture (routine x 2)  Status: None (Preliminary result)   Collection Time: 07/18/20  6:40 PM   Specimen: BLOOD  Result Value Ref Range Status   Specimen Description BLOOD RIGHT ANTECUBITAL  Final   Special Requests   Final    BOTTLES DRAWN AEROBIC AND ANAEROBIC Blood Culture adequate volume   Culture   Final    NO GROWTH 4 DAYS Performed at [facility], [address], [place], [place] [address]    Report Status PENDING  Incomplete  Urine culture     Status: Abnormal   Collection Time: 07/18/20  8:26 PM   Specimen: Urine, Random  Result Value Ref Range Status   Specimen Description   Final    URINE, RANDOM Performed at [facility], [address] [place], [place] [address]    Special Requests   Final    NONE Performed at [facility], [address] [place], [place] [address]    Culture (A)  Final    >=100,000 COLONIES/mL ENTEROCOCCUS FAECALIS VANCOMYCIN RESISTANT ENTEROCOCCUS ISOLATED    Report Status 07/21/2020 FINAL  Final   Organism ID, Bacteria ENTEROCOCCUS FAECALIS (A)  Final      Susceptibility    Enterococcus faecalis - MIC*    AMPICILLIN <=2 SENSITIVE Sensitive     NITROFURANTOIN <=16 SENSITIVE Sensitive     VANCOMYCIN >=32 RESISTANT Resistant     LINEZOLID 2 SENSITIVE Sensitive     * >=100,000 COLONIES/mL ENTEROCOCCUS FAECALIS  MRSA PCR Screening     Status: Abnormal   Collection Time: 07/19/20  3:10 AM   Specimen: Nasal Mucosa; Nasopharyngeal  Result Value Ref Range Status   MRSA by PCR POSITIVE (A) NEGATIVE Final    Comment:        The GeneXpert MRSA Assay (FDA approved for NASAL specimens only), is one component of a comprehensive MRSA colonization surveillance program. It is not intended to diagnose MRSA infection nor to guide or monitor treatment for MRSA infections. RESULT CALLED TO, READ BACK BY AND VERIFIED WITH: STACY CLAY AT 0609 ON 07/19/2020 [place]. Performed at [facility], [address], [place], [place] [address]          Radiology Studies: CT HEAD WO CONTRAST  Result Date: 07/20/2020 CLINICAL DATA:  Mental status change, unknown cause. Additional provided: History of dementia, increased altered mental status today. EXAM: CT HEAD WITHOUT CONTRAST TECHNIQUE: Contiguous axial images were obtained from the base of the skull through the vertex without intravenous contrast. COMPARISON:  Report from MRI/MRA head 05/27/2002 (images unavailable). Report from head CT 05/27/2002 (images unavailable). FINDINGS: Brain: Mildly motion degraded exam. Mild cerebral and cerebellar atrophy. Trace hyperdensity within the lateral ventricle occipital horns bilaterally, likely reflecting acute intraventricular hemorrhage (series 4, image 14). Ill-defined hypoattenuation within the cerebral white matter is nonspecific, but compatible with chronic small vessel ischemic disease. No demarcated cortical infarct. No extra-axial fluid collection. No evidence of intracranial mass. No midline shift. Vascular: No hyperdense vessel.  Atherosclerotic calcifications. Skull: Normal.  Negative for fracture or focal lesion. Sinuses/Orbits: Visualized orbits show no acute finding. Small left maxillary sinus mucous retention cyst. These results were called by telephone at the time of interpretation on 07/20/2020 at 4:24 pm to provider [facility] , who verbally acknowledged these results. IMPRESSION: Mildly motion degraded examination. Trace hyperdensity within the lateral ventricle occipital horns bilaterally, likely reflecting acute intraventricular hemorrhage. Mild generalized atrophy of the brain with chronic small vessel ischemic disease. Electronically Signed   By: Kellie Simmering DO  On: 07/20/2020 16:25        Scheduled Meds: . Chlorhexidine Gluconate Cloth  6 each Topical Q0600  . collagenase   Topical Daily  . feeding supplement  237 mL Oral TID BM  . insulin aspart  0-6 Units Subcutaneous Q4H  . multivitamin with minerals  1 tablet Oral Daily  . mupirocin ointment  1 application Nasal BID   Continuous Infusions: . sodium chloride 75 mL/hr at 07/22/20 0900  . ampicillin-sulbactam (UNASYN) IV Stopped (07/22/20 0452)     LOS: 4 days    Time spent: 25 minutes    Sidney Ace, MD [facility] Pager [phone]  If 7PM-7AM, please contact night-coverage 07/22/2020, 12:26 PM

## 2020-07-22 NOTE — Anesthesia Preprocedure Evaluation (Signed)
Anesthesia Evaluation  Patient identified by MRN, date of birth, ID band Patient awake    Reviewed: Allergy & Precautions, H&P , NPO status , Patient's Chart, lab work & pertinent test results, reviewed documented beta blocker date and time   History of Anesthesia Complications (+) PONV and history of anesthetic complications  Airway Mallampati: II  TM Distance: >3 FB Neck ROM: full    Dental  (+) Teeth Intact   Pulmonary neg pulmonary ROS, pneumonia,    Pulmonary exam normal        Cardiovascular hypertension, negative cardio ROS Normal cardiovascular exam Rhythm:regular Rate:Normal     Neuro/Psych Anxiety  Neuromuscular disease CVA negative neurological ROS  negative psych ROS   GI/Hepatic Neg liver ROS, hiatal hernia, GERD  Medicated,  Endo/Other  diabetes  Renal/GU Renal disease  negative genitourinary   Musculoskeletal   Abdominal   Peds  Hematology  (+) Blood dyscrasia, anemia ,   Anesthesia Other Findings Past Medical History: No date: Anemia No date: Anxiety No date: Atrial flutter ([name])     Comment:  "sometimes" No date: Blood transfusion No date: Breast cancer ([place])     Comment:  right breast No date: Bronchitis No date: Chronic kidney disease     Comment:  occ uti's No date: CKD (chronic kidney disease) stage 3, GFR 30-59 ml/min ([name]) No date: Colon polyps     Comment:  hyperplastic No date: CVA (cerebral infarction) No date: Diverticulosis No date: DVT of leg (deep venous thrombosis) ([name])     Comment:  left; S/P OR No date: Dysrhythmia     Comment:  dr bensimhon  No date: Esophageal stricture No date: GERD (gastroesophageal reflux disease) No date: Gout No date: Hiatal hernia     Comment:  4 cm 02/15/12-04/02/12: History of radiation therapy     Comment:  right breast 4680 cGy/26 sessions, right boost=1400cGy               /7 sessions No date: Hyperlipidemia No date: Hypertension No  date: IBS (irritable bowel syndrome) 1990's: Kidney stones No date: Morbid obesity ([place]) No date: Osteoarthritis No date: Personal history of radiation therapy No date: Pneumonia     Comment:  "walking" No date: PONV (postoperative nausea and vomiting) 02/21/2011: Shingles No date: Shortness of breath on exertion No date: Skin cancer 2002: Stroke [facility])     Comment:  residual:  "little tingling in palm of left hand" No date: UTI (lower urinary tract infection)     Comment:  "I've had a few" Past Surgical History: No date: ABDOMINAL HYSTERECTOMY ~ 01/2011: BILE DUCT STENT PLACEMENT 06/29/11: BREAST LUMPECTOMY     Comment:  right 06/29/2011: BREAST LUMPECTOMY     Comment:  Procedure: BREAST LUMPECTOMY WITH EXCISION OF SENTINEL               NODE;  Surgeon: Pedro Earls, MD;  Location: [place];                Service: General;  Laterality: Right;  right sentinel               node mapping,right sentinel node biopsy, needle               localization right breast lumpectomy 1990's: CATARACT EXTRACTION W/ INTRAOCULAR LENS  IMPLANT, BILATERAL 03/23/11: CHOLECYSTECTOMY     Comment:  lap chole  No date: COLON SURGERY     Comment:  hole in intestine ,tumors? ~ 2000: colonic perforation repair 2012:  HEMORRHOID SURGERY 08/12/2013: INCISION AND DRAINAGE ABSCESS; N/A     Comment:  Procedure: INCISION AND DRAINAGE ABSCESS;  Surgeon: Earnstine Regal, MD;  Location: [facility];  Service: General;                Laterality: N/A; 08/20/2013: INCISION AND DRAINAGE ABSCESS; N/A     Comment:  Procedure: INCISION AND DRAINAGE ABSCESS;  Surgeon:               Ralene Ok, MD;  Location: [facility];  Service:               General;  Laterality: N/A; No date: INCISION AND DRAINAGE BREAST ABSCESS     Comment:  right breast seroma 08/14/2013: IRRIGATION AND DEBRIDEMENT ABSCESS; N/A     Comment:  Procedure: Debridment of perineal tissue and debridement              of perineal wound;  Surgeon: Earnstine Regal, MD;                Location: [facility];  Service: General;  Laterality: N/A; 08/16/2013: IRRIGATION AND DEBRIDEMENT ABSCESS; N/A     Comment:  Procedure: DRESSING CHANGE AND DEBRIDEMENT OF PERINEUM               WOUND;  Surgeon: Earnstine Regal, MD;  Location: [facility];                Service: General;  Laterality: N/A; No date: JOINT REPLACEMENT 12/26/2018: MASS EXCISION; Right     Comment:  Procedure: EXCISION RIGHT ARM SKIN MASS;  Surgeon:               Erroll Luna, MD;  Location: [place];  Service: General;  Laterality: Right; 09/26/6061: MINOR APPLICATION OF WOUND VAC; N/A     Comment:  Procedure: MINOR APPLICATION OF WOUND VAC;  Surgeon:               Ralene Ok, MD;  Location: [facility];  Service:               General;  Laterality: N/A; No date: pelvic bone tumor removal     Comment:  twice in 2000's 08/19/2013: PILONIDAL CYST DRAINAGE; N/A     Comment:  Procedure: Dressing change perineum;  Surgeon: Ralene Ok, MD;  Location: [facility];  Service: General;                Laterality: N/A; ~ 2008; ~ 2010: REPLACEMENT TOTAL KNEE BILATERAL     Comment:  right; left ~ 1946: TONSILLECTOMY 1980's: TOTAL HIP ARTHROPLASTY     Comment:  right BMI    Body Mass Index: 35.95 kg/m     Reproductive/Obstetrics negative OB ROS                             Anesthesia Physical Anesthesia Plan  ASA: III  Anesthesia Plan: General ETT   Post-op Pain Management:    Induction:   PONV Risk Score and Plan:   Airway Management Planned:   Additional Equipment:   Intra-op Plan:   Post-operative Plan:   Informed Consent: I have reviewed the patients History and Physical,  chart, labs and discussed the procedure including the risks, benefits and alternatives for the proposed anesthesia with the patient or authorized representative who has indicated his/her understanding and acceptance.     Dental Advisory  Given  Plan Discussed with: CRNA  Anesthesia Plan Comments:         Anesthesia Quick Evaluation

## 2020-07-22 NOTE — Anesthesia Postprocedure Evaluation (Signed)
Anesthesia Post Note  Patient: [name]  Procedure(s) Performed: DEBRIDMENT OF DECUBITUS ULCER (N/A )  Patient location during evaluation: PACU Anesthesia Type: General Level of consciousness: awake and alert Pain management: pain level controlled Vital Signs Assessment: post-procedure vital signs reviewed and stable Respiratory status: spontaneous breathing, nonlabored ventilation and respiratory function stable Cardiovascular status: blood pressure returned to baseline and stable Postop Assessment: no apparent nausea or vomiting Anesthetic complications: no   No complications documented.   Last Vitals:  Vitals:   07/22/20 1615 07/22/20 1630  BP: (!) 138/40 (!) 126/55  Pulse: 81 89  Resp: 12 12  Temp:  (!) 36.3 C  SpO2: 97% 96%    Last Pain:  Vitals:   07/22/20 1620  TempSrc:   PainSc: Asleep                 Tera Mater

## 2020-07-22 NOTE — Progress Notes (Signed)
ID Pt back from sugery for debridement Lethargic and somnolent Patient Vitals for the past 24 hrs:  BP Temp Temp src Pulse Resp SpO2  07/22/20 2008 (!) 140/91 97.7 F (36.5 C) Oral 95 20 100 %  07/22/20 1828 -- -- -- -- -- 95 %  07/22/20 1630 (!) 126/55 (!) 97.3 F (36.3 C) -- 89 12 96 %  07/22/20 1615 (!) 138/40 -- -- 81 12 97 %  07/22/20 1600 (!) 134/43 -- -- 71 10 96 %  07/22/20 1545 (!) 123/54 (!) 97.2 F (36.2 C) -- 70 15 100 %  07/22/20 1321 -- -- -- -- -- 99 %  07/22/20 1318 (!) 122/55 (!) 97.1 F (36.2 C) Tympanic 74 18 --  07/22/20 1211 (!) 130/42 (!) 97.4 F (36.3 C) Oral 71 16 --  07/22/20 0854 (!) 134/58 97.7 F (36.5 C) Oral 88 18 --  07/22/20 0422 (!) 130/50 98.3 F (36.8 C) Axillary 98 16 100 %  07/22/20 0002 (!) 138/40 98.6 F (37 C) Axillary -- 16 98 %   Chest b/l air entry [id number] abd soft foley catheter Sacrum- covered with wound vac  Labs CBC Latest Ref Rng & Units 07/22/2020 07/19/2020 07/18/2020  WBC 4.0 - 10.5 K/uL 10.3 9.1 11.9(H)  Hemoglobin 12.0 - 15.0 g/dL 10.5(L) 10.0(L) 10.1(L)  Hematocrit 36.0 - 46.0 % 33.7(L) 34.0(L) 32.6(L)  Platelets 150 - 400 K/uL 278 310 320    CMP Latest Ref Rng & Units 07/22/2020 07/21/2020 07/20/2020  Glucose 70 - 99 mg/dL 107(H) 89 100(H)  BUN 8 - 23 mg/dL 70(H) 95(H) 111(H)  Creatinine 0.44 - 1.00 mg/dL 1.82(H) 2.44(H) 3.01(H)  Sodium 135 - 145 mmol/L 148(H) 147(H) 148(H)  Potassium 3.5 - 5.1 mmol/L 3.5 3.9 4.5  Chloride 98 - 111 mmol/L 108 110 108  CO2 22 - 32 mmol/L 24 24 26   Calcium 8.9 - 10.3 mg/dL 10.0 9.6 10.1  Total Protein 6.5 - 8.1 g/dL - - -  Total Bilirubin 0.3 - 1.2 mg/dL - - -  Alkaline Phos 38 - 126 U/L - - -  AST 15 - 41 U/L - - -  ALT 0 - 44 U/L - - -    Impression/recommendation 87 year old female presenting from nursing home with hypotension.  Hypotension due to dehydration has resolved  Acute metabolic encephalopathy fluctuates.  Not sure what her baseline mental status is  Enterococcus in  the urine is likely colonization of the Foley catheter.  Okay to continue Unasyn for now.  Bilateral basilar atelectasis.  AKI.  Due to dehydration.  Creatinine was 4.15 on admission and now it is 1.82 with hydration. Renal ultrasound does not show any hydronephrosis or stone.  Unstageable sacral decubitus underwent debridement today and has a wound VAC.  Continue Unasyn next.  Await cultures  Recent L3 burst fracture needing surgery with hardware L2-L5.  Complicated by wound dehiscence and infection with E. coli and Proteus and treated with almost 8 weeks of antibiotics from November until June 19, 2020.  Initially it was IV ceftriaxone followed by p.o. ciprofloxacin. On reviewing the medical records it looks like patient has declined in the last few months.agree with getting palliative on board.  Discussed the management with the hospitalist.

## 2020-07-22 NOTE — Progress Notes (Signed)
Occupational Therapy Treatment Patient Details Name: [name] MRN: [id number] DOB: 01/06/1930 Today's Date: 07/22/2020    History of present illness 87 y.o. female with medical history significant of dementia, diastolic CHF s/p Lumbar fusion (Oct 2021) due to fall and L3 fx, anemia, atrial flutter, breast cancer status post radiation therapy, CKD stage III, history of CVA, history of DVT left leg postoperatively, esophageal stricture, GERD,  C. difficile infection, left foot pressure ulcer, severe osteopenia, falls.  Pt noted with small amount of acute intraventricular hemorrhage on 2/7 imaging (no recent traumatic event noted); Neurosurgery consulted.   OT comments  [name] was seen for OT/PT co-treatment on this date. Upon arrival to room pt awake/alert, with PT in room to begin session. She remains generally confused t/o session, however, is more engaged with therapists t/o session than chart indicates pt has been during past therapy sessions. Pt endorses "a little" pain in her stomach prior to session, but agreeable to attempting bed mobility. She requires MAX-TOTAL A to come to sitting at EOB with increased cueing for sequencing/hand placement. Pt begins to yell/moan in pain, but does not localize. She is noted to have increased HR up to 145 with monitor indicating AFIB. RN notified. Pt is returned safely to bed with +2 TOTAL assist. She requires MAX A to perform UB grooming at bed level. Pt continues to benefit from skilled OT services to maximize return to PLOF and minimize risk of future falls, injury, caregiver burden, and readmission. Will continue to follow POC. Discharge recommendation remains appropriate.    Follow Up Recommendations  SNF;Supervision/Assistance - 24 hour;Other (comment) (May be more appropriate for long-term care given limited participation during therapy sessions. Will continue to monitor progress and update as appropriate.)    Equipment Recommendations        Recommendations for Other Services      Precautions / Restrictions Precautions Precautions: Fall Precaution Comments: L heel and sacral wound Restrictions Weight Bearing Restrictions: No       Mobility Bed Mobility Overal bed mobility: Needs Assistance Bed Mobility: Supine to Sit;Sit to Supine     Supine to sit: Max assist;Total assist;+2 for physical assistance Sit to supine: Max assist;Total assist;+2 for physical assistance   General bed mobility comments: assist for trunk and B LE's; vc's for technique  Transfers                 General transfer comment: unsafe to attempt    Balance Overall balance assessment: Needs assistance Sitting-balance support: Bilateral upper extremity supported;Feet unsupported Sitting balance-Leahy Scale: Poor Sitting balance - Comments: pt with R lean/push in sitting requiring max assist x1 plus min assist x1 for safe sitting balance                                   ADL either performed or assessed with clinical judgement   ADL Overall ADL's : Needs assistance/impaired                                       General ADL Comments: MAX-TOTAL +2 to come to sitting at EOB. Pt is unable to maintain upright position without +2 assist 2/2 R lateral lean. Pt significantly pain limited, moans in pain while seated and is not re-directable. She is unable to participate in functional activity while seated.  Vision       Perception     Praxis      Cognition Arousal/Alertness: Awake/alert Behavior During Therapy: Anxious Overall Cognitive Status: No family/caregiver present to determine baseline cognitive functioning                                 General Comments: Oriented to name, DOB, and hospital; pt unable to verbalize situation; pt reporting it was January but not oriented to year; general confusion noted during session (pt reporting getting up walking this morning).         Exercises Other Exercises Other Exercises: bed mobility sup<>sit, UB grooming at bed level, requires MAX A to bring washcloth to face.   Shoulder Instructions       General Comments      Pertinent Vitals/ Pain       Pain Assessment: Faces Faces Pain Scale: Hurts whole lot (8/10 sitting edge of bed; 2/10 resting in bed) Pain Location: unable to state Pain Descriptors / Indicators: Grimacing;Moaning;Discomfort (pt vocalizing in pain (resolved once laying down)) Pain Intervention(s): Limited activity within patient's tolerance;Monitored during session;Repositioned  Home Living Family/patient expects to be discharged to:: Skilled nursing facility                                        Prior Functioning/Environment              Frequency  Min 1X/week        Progress Toward Goals  OT Goals(current goals can now be found in the care plan section)     Acute Rehab OT Goals Patient Stated Goal: none stated OT Goal Formulation: With patient Time For Goal Achievement: 08/03/20 Potential to Achieve Goals: [place] Discharge plan remains appropriate;Frequency remains appropriate    Co-evaluation      Reason for Co-Treatment: Necessary to address cognition/behavior during functional activity;For patient/therapist safety;To address functional/ADL transfers PT goals addressed during session: Mobility/safety with mobility;Balance OT goals addressed during session: ADL's and self-care      AM-PAC OT "6 Clicks" Daily Activity     Outcome Measure   Help from another person eating meals?: A Lot Help from another person taking care of personal grooming?: A Lot Help from another person toileting, which includes using toliet, bedpan, or urinal?: Total Help from another person bathing (including washing, rinsing, drying)?: Total Help from another person to put on and taking off regular upper body clothing?: A Lot Help from another person to put on and taking  off regular lower body clothing?: Total 6 Click Score: 9    End of Session Equipment Utilized During Treatment: Oxygen  OT Visit Diagnosis: Other abnormalities of gait and mobility (R26.89);Muscle weakness (generalized) (M62.81)   Activity Tolerance Patient limited by pain;Treatment limited secondary to agitation   Patient Left in bed;with call bell/phone within reach;with bed alarm set;with restraints reapplied (Safety mitts donned.)   Nurse Communication Mobility status        Time: 2542-7062 OT Time Calculation (min): 26 min  Charges: OT General Charges $OT Visit: 1 Visit OT Treatments $Self Care/Home Management : 8-22 mins  Shara Blazing, M.S., OTR/L Ascom: [phone] 07/22/20, 1:16 PM

## 2020-07-22 NOTE — Progress Notes (Signed)
SLP Cancellation Note  Patient Details Name: [name] MRN: [id number] DOB: Jun 02, 1930   Cancelled treatment:        Per chart review pt is NPO for possible wound debridement this afternoon. Will hold ST tx for today, follow up next date as appropriate.   Deneise Lever, [place], CCC-SLP Speech-Language Pathologist                                                                                                 Aliene Altes 07/22/2020, 8:25 AM

## 2020-07-23 ENCOUNTER — Encounter: Payer: Self-pay | Admitting: Surgery

## 2020-07-23 DIAGNOSIS — I959 Hypotension, unspecified: Secondary | ICD-10-CM | POA: Diagnosis not present

## 2020-07-23 DIAGNOSIS — B952 Enterococcus as the cause of diseases classified elsewhere: Secondary | ICD-10-CM

## 2020-07-23 DIAGNOSIS — Z515 Encounter for palliative care: Secondary | ICD-10-CM

## 2020-07-23 DIAGNOSIS — N179 Acute kidney failure, unspecified: Secondary | ICD-10-CM | POA: Diagnosis not present

## 2020-07-23 DIAGNOSIS — Z7189 Other specified counseling: Secondary | ICD-10-CM | POA: Diagnosis not present

## 2020-07-23 DIAGNOSIS — G9341 Metabolic encephalopathy: Secondary | ICD-10-CM | POA: Diagnosis not present

## 2020-07-23 DIAGNOSIS — T148XXD Other injury of unspecified body region, subsequent encounter: Secondary | ICD-10-CM | POA: Diagnosis not present

## 2020-07-23 DIAGNOSIS — E43 Unspecified severe protein-calorie malnutrition: Secondary | ICD-10-CM | POA: Diagnosis not present

## 2020-07-23 DIAGNOSIS — L89150 Pressure ulcer of sacral region, unstageable: Secondary | ICD-10-CM | POA: Diagnosis not present

## 2020-07-23 LAB — CULTURE, BLOOD (ROUTINE X 2)
Culture: NO GROWTH
Culture: NO GROWTH
Special Requests: ADEQUATE
Special Requests: ADEQUATE

## 2020-07-23 LAB — GLUCOSE, CAPILLARY
Glucose-Capillary: 119 mg/dL — ABNORMAL HIGH (ref 70–99)
Glucose-Capillary: 122 mg/dL — ABNORMAL HIGH (ref 70–99)
Glucose-Capillary: 126 mg/dL — ABNORMAL HIGH (ref 70–99)
Glucose-Capillary: 131 mg/dL — ABNORMAL HIGH (ref 70–99)
Glucose-Capillary: 153 mg/dL — ABNORMAL HIGH (ref 70–99)
Glucose-Capillary: 183 mg/dL — ABNORMAL HIGH (ref 70–99)
Glucose-Capillary: 215 mg/dL — ABNORMAL HIGH (ref 70–99)

## 2020-07-23 LAB — CBC WITH DIFFERENTIAL/PLATELET
Abs Immature Granulocytes: 0.15 10*3/uL — ABNORMAL HIGH (ref 0.00–0.07)
Basophils Absolute: 0 10*3/uL (ref 0.0–0.1)
Basophils Relative: 0 %
Eosinophils Absolute: 0 10*3/uL (ref 0.0–0.5)
Eosinophils Relative: 0 %
HCT: 35.6 % — ABNORMAL LOW (ref 36.0–46.0)
Hemoglobin: 10.5 g/dL — ABNORMAL LOW (ref 12.0–15.0)
Immature Granulocytes: 2 %
Lymphocytes Relative: 10 %
Lymphs Abs: 0.6 10*3/uL — ABNORMAL LOW (ref 0.7–4.0)
MCH: 26.6 pg (ref 26.0–34.0)
MCHC: 29.5 g/dL — ABNORMAL LOW (ref 30.0–36.0)
MCV: 90.4 fL (ref 80.0–100.0)
Monocytes Absolute: 0.1 10*3/uL (ref 0.1–1.0)
Monocytes Relative: 1 %
Neutro Abs: 5.3 10*3/uL (ref 1.7–7.7)
Neutrophils Relative %: 87 %
Platelets: 270 10*3/uL (ref 150–400)
RBC: 3.94 MIL/uL (ref 3.87–5.11)
RDW: 18.3 % — ABNORMAL HIGH (ref 11.5–15.5)
WBC: 6.2 10*3/uL (ref 4.0–10.5)
nRBC: 0 % (ref 0.0–0.2)

## 2020-07-23 LAB — MAGNESIUM: Magnesium: 1.8 mg/dL (ref 1.7–2.4)

## 2020-07-23 LAB — BASIC METABOLIC PANEL
Anion gap: 17 — ABNORMAL HIGH (ref 5–15)
BUN: 63 mg/dL — ABNORMAL HIGH (ref 8–23)
CO2: 23 mmol/L (ref 22–32)
Calcium: 9.8 mg/dL (ref 8.9–10.3)
Chloride: 107 mmol/L (ref 98–111)
Creatinine, Ser: 1.76 mg/dL — ABNORMAL HIGH (ref 0.44–1.00)
GFR, Estimated: 27 mL/min — ABNORMAL LOW (ref >60)
Glucose, Bld: 133 mg/dL — ABNORMAL HIGH (ref 70–99)
Potassium: 4.2 mmol/L (ref 3.5–5.1)
Sodium: 147 mmol/L — ABNORMAL HIGH (ref 135–145)

## 2020-07-23 MED ORDER — CHLORHEXIDINE GLUCONATE CLOTH 2 % EX PADS
6.0000 | MEDICATED_PAD | Freq: Every day | CUTANEOUS | Status: DC
  Administered 2020-07-24 – 2020-08-10 (×17): 6 via TOPICAL

## 2020-07-23 MED ORDER — MORPHINE SULFATE (PF) 2 MG/ML IV SOLN
2.0000 mg | INTRAVENOUS | Status: DC | PRN
  Administered 2020-07-23 – 2020-08-03 (×16): 2 mg via INTRAVENOUS
  Filled 2020-07-23 (×18): qty 1

## 2020-07-23 MED ORDER — HYDROCODONE-ACETAMINOPHEN 5-325 MG PO TABS
1.0000 | ORAL_TABLET | Freq: Four times a day (QID) | ORAL | Status: DC | PRN
  Administered 2020-07-23 – 2020-08-10 (×13): 1 via ORAL
  Filled 2020-07-23 (×17): qty 1

## 2020-07-23 MED ORDER — ALLOPURINOL 100 MG PO TABS
100.0000 mg | ORAL_TABLET | Freq: Every day | ORAL | Status: DC
  Administered 2020-07-23 – 2020-08-09 (×13): 100 mg via ORAL
  Filled 2020-07-23 (×18): qty 1

## 2020-07-23 MED ORDER — GABAPENTIN 100 MG PO CAPS: 100.0000 mg | ORAL_CAPSULE | Freq: Two times a day (BID) | ORAL | Status: DC

## 2020-07-23 MED ORDER — HALOPERIDOL LACTATE 5 MG/ML IJ SOLN
2.0000 mg | Freq: Four times a day (QID) | INTRAMUSCULAR | Status: DC | PRN
  Administered 2020-07-23 – 2020-07-31 (×5): 2 mg via INTRAMUSCULAR
  Filled 2020-07-23 (×5): qty 1

## 2020-07-23 MED ORDER — HALOPERIDOL 2 MG PO TABS
2.0000 mg | ORAL_TABLET | Freq: Four times a day (QID) | ORAL | Status: DC | PRN
  Filled 2020-07-23: qty 1

## 2020-07-23 NOTE — Progress Notes (Addendum)
CCMD called about 9:30p stating that patient's O2 was 78. After checking with dinamap it was about accurate, pulse ox sensor changed. Put patient on 6 L [place] to get her above 90, weaned down to 2 L. Attempted to wean off completely about around 2am was at 97-100% O@ sats but dropped to 88% so she's back on 2L at 99% and has been like that for remainder of shift. NP Randol Kern notified.

## 2020-07-23 NOTE — Progress Notes (Signed)
SLP Cancellation Note  Patient Details Name: [name] MRN: [id number] DOB: 07/05/1929   Cancelled treatment:       Reason Eval/Treat Not Completed: (P) Other (comment) (refused). RN reports pt has been alert and tolerated full liquids this AM. SLP attempted visit for advanced texture trials, potential upgrade. Patient more verbally interactive with SLP this date, however refused all attempts for [facility] elevation and refused offerings of liquids, solids. Continue full liquids, SLP will continue efforts.   Deneise Lever, [place], CCC-SLP Speech-Language Pathologist    Aliene Altes 07/23/2020, 10:25 AM

## 2020-07-23 NOTE — Progress Notes (Signed)
[facility] Day(s): 5.   Post op day(s): 1 Day Post-Op.   Interval History:  Patient seen and examined No acute events or new complaints overnight.  Patient with history of dementia; unable to participate in history Remains without leukocytosis; WBC 6.2K Renal function improved; sCr - 1.76 Hypernatremia to 147; otherwise no electrolyte derangements Cx from OR on 02/09 pending Wound vac bridged to left hip; good seal She continues on Unasyn; ID following   Vital signs in last 24 hours: [min-max] current  Temp:  [97.1 F (36.2 C)-98.8 F (37.1 C)] 98.6 F (37 C) (02/10 0524) Pulse Rate:  [70-95] 95 (02/10 0524) Resp:  [10-20] 16 (02/10 0524) BP: (110-161)/(40-100) 127/100 (02/10 0524) SpO2:  [95 %-100 %] 100 % (02/10 0524)     Height: 5\' 5"  (165.1 cm) Weight: 98 kg BMI (Calculated): 35.95   Intake/Output last 2 shifts:  02/09 0701 - 02/10 0700 In: 1336.5 [I.V.:1136.5; IV Piggyback:200] Out: 410 [Urine:400; Blood:10]   Physical Exam:  Constitutional: alert, resting in bed, confused Respiratory: breathing non-labored at rest  Cardiovascular: regular rate and sinus rhythm  Musculoskeletal: Safety mittens to upper extremities Integumentary: Wound vac to sacrum with bridge to left hip; good seal   Labs:  CBC Latest Ref Rng & Units 07/23/2020 07/22/2020 07/19/2020  WBC 4.0 - 10.5 K/uL 6.2 10.3 9.1  Hemoglobin 12.0 - 15.0 g/dL 10.5(L) 10.5(L) 10.0(L)  Hematocrit 36.0 - 46.0 % 35.6(L) 33.7(L) 34.0(L)  Platelets 150 - 400 K/uL 270 278 310   CMP Latest Ref Rng & Units 07/23/2020 07/22/2020 07/21/2020  Glucose 70 - 99 mg/dL 133(H) 107(H) 89  BUN 8 - 23 mg/dL 63(H) 70(H) 95(H)  Creatinine 0.44 - 1.00 mg/dL 1.76(H) 1.82(H) 2.44(H)  Sodium 135 - 145 mmol/L 147(H) 148(H) 147(H)  Potassium 3.5 - 5.1 mmol/L 4.2 3.5 3.9  Chloride 98 - 111 mmol/L 107 108 110  CO2 22 - 32 mmol/L 23 24 24   Calcium 8.9 - 10.3 mg/dL 9.8 10.0 9.6  Total Protein  6.5 - 8.1 g/dL - - -  Total Bilirubin 0.3 - 1.2 mg/dL - - -  Alkaline Phos 38 - 126 U/L - - -  AST 15 - 41 U/L - - -  ALT 0 - 44 U/L - - -    Imaging studies: No new pertinent imaging studies   Assessment/Plan: 87 y.o. female 1 Day Post-Op s/p incision and debridement with wound vac placement for large unstageable sacral pressure injury, complicated by pertinent comorbidities includingadvanced age, frailty, and dementia.   - Continue wound vac; MWF schedule. We will ask [place] RN to assist with bedside changes starting tomorrow   - Continue IV ABx (Unasyn); ID on board    - Pain control prn   - Recommend continue local wound care, frequent repositioning +/- low air loss mattress - Further management per primary service    All of the above findings and recommendations were discussed with the medical team.  -- Edison Simon, PA-C [facility] 07/23/2020, 7:18 AM [phone] M-F: 7am - 4pm

## 2020-07-23 NOTE — Plan of Care (Signed)
PMT note:   Called son [name] this morning at both numbers listed. VM left with call back number. Patient is unable to participate in a [place] conversation. Husband would like communication to occur with [name]. Will await call back.   If patient goes to SNF or home, recommend outpatient palliative to follow for [place] if she discharges prior to establishing a meeting.

## 2020-07-23 NOTE — Treatment Plan (Signed)
Writer attempted to feed patient at this time. Pt spit food at Probation officer and screaming "dont kill my son." Pt tried to be reoriented at this time and comforted. Not successful at this time. Pt given Norco in apple sauce and she spit it out. Will attempt to re feed later. Pt very tearful and crying.

## 2020-07-23 NOTE — Consult Note (Addendum)
Consultation Note Date: 07/23/2020   Patient Name: [name]  DOB: 04/19/1930  MRN: [id number]  Age / Sex: 87 y.o., female  PCP: Biagio Borg, MD Referring Physician: Sidney Ace, MD  Reason for Consultation: Establishing goals of care  HPI/Patient Profile: KAMALI SAKATA is a 87 y.o. female with medical history significant of dementia, diastolic CHF  s/p Lumbar fusion due to L3 frx and repair of traumatic CSF leak Oct 2021, anemia, atrial flutter, breast cancer status post radiation therapy, CKD stage III, history of CVA, history of DVT left leg postoperatively, is a   esophageal stricture, GERD,  C. difficile infection, left foot pressure ulcer, severe osteopenia, falls. Presented with hypotension.  Clinical Assessment and Goals of Care: Patient resting in bed with debridement yesterday. Spoke with son [name]. 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 [name] states prior to her fall in October, at baseline she lived with her husband at home.  She used a walker for mobility and she cooked.  He states that she liked to put together puzzles and like to play solitaire.   He discusses that after her fall in October, her status changed.  He states that her status has been declining, and markedly so since December. He discusses concerns for her care since October. He tells me that he feels that a lot of her decline is due to her being alone because she is in a facility so far away from her husband. He states his father has felt very alone at home without her. He states that his father is the Media planner for Ms. [employer], and that he he will honor what ever decision that his father makes.     We discussed her diagnosis, prognosis, GOC, EOL wishes disposition and options.  A detailed discussion was had today regarding advanced directives.  Concepts specific to code  status, artifical feeding and hydration, IV antibiotics and rehospitalization were discussed.  The difference between an aggressive medical intervention path and a comfort care path was discussed.  Values and goals of care important to patient and family were attempted to be elicited.  Discussed limitations of medical interventions to prolong quality of life in some situations and discussed the concept of human mortality and suffering. We discussed quality and quantity of life.  He states that his father was a paramedic in [employer] and saw a lot of injuries. He states his father also had injuries and burns himself in the past. When discussing Ms. Kearn's wound, he states his father will tell you that he has seen worse. Discussed concerns for being able to heal her wound.   He states the overall goal is to be able to get her home, and able to move herself from a bed or chair to a bedside toilet and back. The current goal is to get her to a facility in [place] where her husband and family can visit her daily. He states they will make decisions 1  day at a time. He tells me that he does not believe that the family would want a feeding tube if her oral intake is not sufficient, and would address that at the time. Other than this, they would like full code full scope.    SUMMARY OF RECOMMENDATIONS   Full code/full scope.  Recommend outpatient palliative  He states that she has been on gabapentin in the past, and had unpleasant side effects from it including arm jerks and mental changes.  Prognosis:   Poor       Primary Diagnoses: Present on Admission: . Wound healing, delayed . Protein-calorie malnutrition, severe ([place]) . Hyperlipidemia . Essential hypertension . CKD (chronic kidney disease) stage 3, GFR 30-59 ml/min ([name]) . Chronic diastolic CHF (congestive heart failure) ([place]) . AKI (acute kidney injury) ([place]) . Dehydration . Hyperkalemia . CAP (community acquired pneumonia) .  Acute metabolic encephalopathy . Sacral decubitus ulcer, stage IV ([place])   I have reviewed the medical record, interviewed the patient and family, and examined the patient. The following aspects are pertinent.  Past Medical History:  Diagnosis Date  . Anemia   . Anxiety   . Atrial flutter ([place])    "sometimes"  . Blood transfusion   . Breast cancer ([place])    right breast  . Bronchitis   . Chronic kidney disease    occ uti's  . CKD (chronic kidney disease) stage 3, GFR 30-59 ml/min ([name])   . Colon polyps    hyperplastic  . CVA (cerebral infarction)   . Diverticulosis   . DVT of leg (deep venous thrombosis) ([name])    left; S/P OR  . Dysrhythmia    dr bensimhon   . Esophageal stricture   . GERD (gastroesophageal reflux disease)   . Gout   . Hiatal hernia    4 cm  . History of radiation therapy 02/15/12-04/02/12   right breast 4680 cGy/26 sessions, right boost=1400cGy /7 sessions  . Hyperlipidemia   . Hypertension   . IBS (irritable bowel syndrome)   . Kidney stones 1990's  . Morbid obesity ([place])   . Osteoarthritis   . Personal history of radiation therapy   . Pneumonia    "walking"  . PONV (postoperative nausea and vomiting)   . Shingles 02/21/2011  . Shortness of breath on exertion   . Skin cancer   . Stroke [facility]) 2002   residual:  "little tingling in palm of left hand"  . UTI (lower urinary tract infection)    "I've had a few"   Social History   Socioeconomic History  . Marital status: Married    Spouse name: Not on file  . Number of children: Not on file  . Years of education: Not on file  . Highest education level: Not on file  Occupational History  . Occupation: hairdresser  Tobacco Use  . Smoking status: Never Smoker  . Smokeless tobacco: Never Used  Substance and Sexual Activity  . Alcohol use: No  . Drug use: No  . Sexual activity: Never  Other Topics Concern  . Not on file  Social History Narrative   Lives at home with her husband.  Uses a walker  for ambulation   Social Determinants of Health   Financial Resource Strain: Not on file  Food Insecurity: Not on file  Transportation Needs: Not on file  Physical Activity: Not on file  Stress: Not on file  Social Connections: Not on file   Family History  Problem Relation Age of  Onset  . Breast cancer Sister        x 2  . Prostate cancer Brother   . Stroke Mother   . Kidney failure Sister   . Diabetes Sister   . Heart disease Paternal Uncle        multiple  . Heart disease Maternal Uncle        multiple  . Colon cancer Neg Hx    Scheduled Meds: . Chlorhexidine Gluconate Cloth  6 each Topical Daily  . collagenase   Topical Daily  . feeding supplement  237 mL Oral TID BM  . insulin aspart  0-6 Units Subcutaneous Q4H  . multivitamin with minerals  1 tablet Oral Daily  . mupirocin ointment  1 application Nasal BID   Continuous Infusions: . sodium chloride Stopped (07/23/20 1134)  . ampicillin-sulbactam (UNASYN) IV 3 g (07/23/20 1402)   PRN Meds:.acetaminophen, albuterol, ipratropium-albuterol, traMADol Medications Prior to Admission:  Prior to Admission medications   Medication Sig Start Date End Date Taking? Authorizing Provider  acetaminophen (TYLENOL) 500 MG tablet Take 1,000 mg by mouth every 8 (eight) hours.   Yes [provider]  acidophilus (RISAQUAD) CAPS capsule Take 1 capsule by mouth at bedtime.   Yes [provider]  allopurinol (ZYLOPRIM) 100 MG tablet Take 100 mg by mouth every other day.   Yes [provider]  allopurinol (ZYLOPRIM) 100 MG tablet Take 200 mg by mouth every other day.   Yes [provider]  amLODipine (NORVASC) 5 MG tablet TAKE 1 TABLET BY MOUTH ONCE A DAY Patient taking differently: Take 5 mg by mouth daily. 01/27/20  Yes Biagio Borg, MD  aspirin 325 MG EC tablet Take 1 tablet (325 mg total) by mouth every other day. 06/23/14  Yes Hosie Poisson, MD  furosemide (LASIX) 40 MG tablet Take 40 mg by mouth  daily.   Yes [provider]  gabapentin (NEURONTIN) 100 MG capsule Take 100 mg by mouth 2 (two) times daily.   Yes [provider]  HYDROcodone-acetaminophen (NORCO/VICODIN) 5-325 MG tablet Take 1 tablet by mouth every 6 (six) hours as needed for moderate pain.   Yes [provider]  ipratropium-albuterol (DUONEB) 0.5-2.5 (3) MG/3ML SOLN Take 3 mLs by nebulization in the morning and at bedtime.   Yes [provider]  lisinopril (ZESTRIL) 20 MG tablet Take 40 mg by mouth daily.   Yes [provider]  Magnesium Hydroxide (MILK OF MAGNESIA PO) Take 30 mLs by mouth 2 (two) times daily.   Yes [provider]  Morphine Sulfate (MORPHINE CONCENTRATE) 10 mg / 0.5 ml concentrated solution Take 5 mg by mouth every 4 (four) hours as needed for severe pain.   Yes [provider]  Multiple Vitamin (MULTIVITAMIN ADULT PO) Take 1 tablet by mouth daily.   Yes [provider]  vitamin C (ASCORBIC ACID) 250 MG tablet Take 250 mg by mouth 2 (two) times daily.   Yes [provider]  Wound Dressings (MEDIHONEY WOUND/BURN DRESSING) GEL Apply 1 application topically daily. (apply to coccyx and cover with calcium alginate dressing)   Yes [provider]   Allergies  Allergen Reactions  . Ace Inhibitors Other (See Comments)    Tolerates lisinopril at home. Pt does not recall allergy.  . Atorvastatin     REACTION: myalgias  . Oxycodone-Acetaminophen     REACTION: confusion, fatigue  . Rosuvastatin     REACTION: leg weakness  . Simvastatin     REACTION:  leg weakness  . Codeine Nausea And Vomiting and Rash  . Sulfonamide Derivatives Hives and Rash   Review of Systems  Physical Exam Constitutional:      Comments: Eyes closed.      Vital Signs: BP 135/69 (BP Location: Right Arm)   Pulse 99 Comment: recheck  Temp 97.9 F (36.6 C)   Resp 20   Ht 5\' 5"  (1.651 m)   Wt 98 kg   SpO2 100%   BMI 35.95 kg/m  Pain Scale:  PAINAD POSS *See Group Information*: 1-Acceptable,Awake and alert Pain Score: 0-No pain   SpO2: SpO2: 100 % O2 Device:SpO2: 100 % O2 Flow Rate: .O2 Flow Rate (L/min): 2 L/min  IO: Intake/output summary:   Intake/Output Summary (Last 24 hours) at 07/23/2020 1507 Last data filed at 07/23/2020 1220 Gross per 24 hour  Intake 1632.78 ml  Output 435 ml  Net 1197.78 ml    LBM: Last BM Date:  (pta) Baseline Weight: Weight: 98 kg Most recent weight: Weight: 98 kg        Time In: 2:20 Time Out: 3:00 Time Total: 40 min Greater than 50%  of this time was spent counseling and coordinating care related to the above assessment and plan.  Signed by: Asencion Gowda, NP   Please contact Palliative Medicine Team phone at [phone] for questions and concerns.  For individual provider: See Shea Evans

## 2020-07-23 NOTE — Progress Notes (Signed)
PROGRESS NOTE    [name]  RDE:[id number] DOB: [birth year] DOA: 07/18/2020 PCP: Biagio Borg, MD   Brief Narrative:  [name] Coveyis a 87 y.o.femalewith medical history significant of dementia, diastolic CHF s/p Lumbar fusion due to L3 frx and repair of traumatic CSF leakOct 2021,anemia, atrial flutter, breast cancer status post radiation therapy, CKD stage III, history of CVA, history of DVT left leg postoperatively, is a esophageal stricture,GERD, C. difficile infection,left foot pressure ulcer,severe osteopenia,falls.Presented withHypotension initial BP at facility down to 96/34 EMS gave 500 ml bolus and BP was 131/48 Per nursing staff patient have had decreased urine output. She has been somnolent/confused while here. She is found with AKI, Ucx+, and PNA . Ct head obtained +for acute intrav. Bleed, but trace. Neurosurgery was consulted. Son [name] was notified.  Consulted palliative care. Family wants to keep her full code for now.ID consulted.  2/6-confused, quiet.  2/7-sitting up in bed, still confused, mumbling something. (spoke to son, states since October she has been "mumbling" and not being her self. Prior to October (before surgery) pt apparently was active and sharp. 2/8-somnolent today. Does not open eyes for me. Per nsg does take meds . CT head obtained-see result 2/9: Patient more interactive.  Nonsensical speech.  Does not appear in any distress.  Patient n.p.o. for surgical debridement today.  Remains on IV antibiotics. 2/10: Postop day 1 status post wound debridement with general surgery.  Tolerated procedure well.  Wound VAC in place.  Seen by general surgery this morning.   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  -Baseline mental status difficult to ascertain given underlying dementia -Per neurosurgery-no further inpatient intervention necessary Plan: Hypotonic fluids for now considering hypernatremia Continue Unasyn Frequent neuro checks Avoid sedatives  Hypernatremia Persistent despite hypotonic fluids Likely owing to poor p.o. fluid intake P.o. fluid intake slowly improving Plan: Continue half-normal saline for now We will attempt to discontinue within the next 24 hours if sodium improves  UTI +UA and ucx with Enter faeca.  Vancomycin-resistant Enterococcus isolated. ID consulted. Per ID Enterococcus is likely is contaminant Confirmed with RN, Foley was exchanged on admission including tubing and bag Plan: Continue Unasyn per infectious disease recommendations   Possible pneumonia, unlikely Per ID chest x-ray likely represents atelectasis White blood cell high level of normal Procalcitonin trending down Plan: Continue Unasyn as above Oxygen as needed  Acute intraventricular hemorrhage-Trace .  Neurosurgery consulted Recommended repeat CTA head Will pursue in 24 hours if mental status remains poor   Sacral decubitus ulcer Status post or debridement 07/22/2020 Tolerated debridement, wound VAC placed Plan:  Continue Unasyn  Continue wound VAC WOC consult Pain control as needed   Protein-calorie malnutrition, severe  Speech and RD following  Hyperlipidemia -chronic stable not on statin statin  Essential  hypertension hold home medications given hypotension on admission  Acute renal failure on chronic kidney disease stage IIIa F/u renal US-renal medical disease no hydronephrosis -chronic avoid nephrotoxic medications such as NSAIDs, Vanco Zosyn combo, avoid hypotension Monitor levels  Chronic diastolic CHF (congestive heart failure) ([name])currently appears to be in a dry side Hold lasix  AKI(acute kidney injury) ([place]) -appears to be prerenal-dehydration, on ACE and Lasix.   Hold lisinopril rehydrate and continue to follow RENAL US showing no evidence of hydronephrosis 2/8- improving creatinine, today 1.82 Continue to monitor   Dehydration-continue with IV fluid  DM2--controlled A1c 5.6 TSH 5.4, will need recheck in 4 weeks when acute phase illness improves    DVT prophylaxis: SCD Code Status: Full Family Communication: None today.  Attempted to call patient's son.  No answer, no voicemail left.  Palliative has also been trying to reach him Disposition Plan: Status is: Inpatient  Remains inpatient appropriate because:Inpatient level of care appropriate due to severity of illness   Dispo: The patient is from: Home              Anticipated d/c is to: SNF              Anticipated d/c date is: > 3 days              Patient currently is not medically stable to d/c.   Difficult to place patient No  Disposition plan pending.  Patient postop day 1 status post surgical debridement.  Wound VAC in place.  Will need placement post discharge.  Palliative care to follow-up with family regarding goals of care.     Level of care: Med-Surg  Consultants:   General surgery  Palliative care  Procedures:   Surgical debridement of sacral decubitus ulcer, 07/22/2020  Antimicrobials:   Unasyn   Subjective: Patient seen and examined.  Interactive but nonsensical speech.  Wound VAC in place.  Patient does not appear in any visible distress.  Objective: Vitals:    07/23/20 0524 07/23/20 0800 07/23/20 1153 07/23/20 1220  BP: (!) 127/100 (!) 146/48 135/69   Pulse: 95 89 100 99  Resp: 16 19 20    Temp: 98.6 F (37 C) 98 F (36.7 C) 97.9 F (36.6 C)   TempSrc: Oral Oral    SpO2: 100% 99% 100%   Weight:      Height:        Intake/Output Summary (Last 24 hours) at 07/23/2020 1230 Last data filed at 07/23/2020 1220 Gross per 24 hour  Intake 1732.78 ml  Output 435 ml  Net 1297.78 ml   Filed Weights   07/18/20 1603  Weight: 98 kg    Examination:  General exam: No acute distress.  Confused.  Frail Respiratory system: Bibasilar crackles.  Normal work of breathing.  Room air Cardiovascular system: S1-S2, regular rate and rhythm,+murmur Gastrointestinal system: Soft, nontender, nondistended, normal bowel sounds Central nervous system: Alert, confused, oriented x1, no focal deficits Extremities: Symmetric 5 x 5 power. Skin: Sacral decubitus status post debridement.  Wound VAC in place Psychiatry: Judgement and insight appear impaired. Mood & affect flattened.     Data Reviewed: I have personally reviewed following labs and imaging studies  CBC: Recent Labs  Lab 07/18/20 1554 07/19/20 0311 07/22/20 0751 07/23/20 0402  WBC 11.9* 9.1 10.3 6.2  NEUTROABS 6.7 7.1 7.7 5.3  HGB 10.1* 10.0* 10.5* 10.5*  HCT 32.6* 34.0* 33.7* 35.6*  MCV 90.6 92.4 89.6 90.4  PLT 320 310 278 237   Basic Metabolic Panel: Recent Labs  Lab 07/18/20 2342  07/19/20 0311 07/20/20 0424 07/21/20 0421 07/22/20 0751 07/23/20 0402  NA 139 139 148* 147* 148* 147*  K 5.3* 5.4* 4.5 3.9 3.5 4.2  CL 102 102 108 110 108 107  CO2 25 25 26 24 24 23   GLUCOSE 130* 102* 100* 89 107* 133*  BUN 129* 128* 111* 95* 70* 63*  CREATININE 3.94* 3.93* 3.01* 2.44* 1.82* 1.76*  CALCIUM 10.6* 10.8* 10.1 9.6 10.0 9.8  MG 2.6* 2.6*  --   --  1.8 1.8  PHOS  --  6.4*  --   --   --   --    GFR: Estimated Creatinine Clearance: 25.6 mL/min (A) (by C-G formula based on SCr of 1.76 mg/dL  (H)). Liver Function Tests: Recent Labs  Lab 07/18/20 1554 07/19/20 0311  AST 24 28  ALT 14 15  ALKPHOS 83 85  BILITOT 0.7 0.8  PROT 7.0 6.9  ALBUMIN 2.4* 2.4*   No results for input(s): LIPASE, AMYLASE in the last 168 hours. No results for input(s): AMMONIA in the last 168 hours. Coagulation Profile: No results for input(s): INR, PROTIME in the last 168 hours. Cardiac Enzymes: Recent Labs  Lab 07/18/20 2342 07/19/20 0311  CKTOTAL 373* 460*   BNP (last 3 results) No results for input(s): PROBNP in the last 8760 hours. HbA1C: No results for input(s): HGBA1C in the last 72 hours. CBG: Recent Labs  Lab 07/22/20 2158 07/23/20 0052 07/23/20 0515 07/23/20 0739 07/23/20 1151  GLUCAP 131* 131* 126* 119* 215*   Lipid Profile: No results for input(s): CHOL, HDL, LDLCALC, TRIG, CHOLHDL, LDLDIRECT in the last 72 hours. Thyroid Function Tests: No results for input(s): TSH, T4TOTAL, FREET4, T3FREE, THYROIDAB in the last 72 hours. Anemia Panel: No results for input(s): VITAMINB12, FOLATE, FERRITIN, TIBC, IRON, RETICCTPCT in the last 72 hours. Sepsis Labs: Recent Labs  Lab 07/18/20 1554 07/18/20 2105 07/19/20 0311 07/20/20 0424  PROCALCITON 0.51  --  0.46 0.38  LATICACIDVEN 1.0 0.9  --   --     Recent Results (from the past 240 hour(s))  Blood Culture (routine x 2)     Status: None   Collection Time: 07/18/20  4:30 PM   Specimen: BLOOD  Result Value Ref Range Status   Specimen Description BLOOD BLOOD RIGHT FOREARM  Final   Special Requests   Final    BOTTLES DRAWN AEROBIC AND ANAEROBIC Blood Culture adequate volume   Culture   Final    NO GROWTH 5 DAYS Performed at [facility], [address], [place], [place] [address]    Report Status 07/23/2020 FINAL  Final  SARS Coronavirus 2 by RT PCR (hospital order, performed in [place] hospital lab) Nasopharyngeal Nasopharyngeal Swab     Status: None   Collection Time: 07/18/20  5:39 PM   Specimen:  Nasopharyngeal Swab  Result Value Ref Range Status   SARS Coronavirus 2 NEGATIVE NEGATIVE Final    Comment: (NOTE) SARS-CoV-2 target nucleic acids are NOT DETECTED.  The SARS-CoV-2 RNA is generally detectable in upper and lower respiratory specimens during the acute phase of infection. The lowest concentration of SARS-CoV-2 viral copies this assay can detect is 250 copies / mL. A negative result does not preclude SARS-CoV-2 infection and should not be used as the sole basis for treatment or other patient management decisions.  A negative result may occur with improper specimen collection / handling, submission of specimen other than nasopharyngeal swab, presence of viral mutation(s) within the areas targeted by this assay, and  inadequate number of viral copies (<250 copies / mL). A negative result must be combined with clinical observations, patient history, and epidemiological information.  Fact Sheet for Patients:   StrictlyIdeas.no  Fact Sheet for Healthcare Providers: BankingDealers.co.za  This test is not yet approved or  cleared by the [place] FDA and has been authorized for detection and/or diagnosis of SARS-CoV-2 by FDA under an Emergency Use Authorization (EUA).  This EUA will remain in effect (meaning this test can be used) for the duration of the COVID-19 declaration under Section 564(b)(1) of the Act, 21 U.S.C. section 360bbb-3(b)(1), unless the authorization is terminated or revoked sooner.  Performed at [facility], [address] [place], [place] [address]   Blood Culture (routine x 2)     Status: None   Collection Time: 07/18/20  6:40 PM   Specimen: BLOOD  Result Value Ref Range Status   Specimen Description BLOOD RIGHT ANTECUBITAL  Final   Special Requests   Final    BOTTLES DRAWN AEROBIC AND ANAEROBIC Blood Culture adequate volume   Culture   Final    NO GROWTH 5 DAYS Performed at [facility], [address] [place], [place] [address]    Report Status 07/23/2020 FINAL  Final  Urine culture     Status: Abnormal   Collection Time: 07/18/20  8:26 PM   Specimen: Urine, Random  Result Value Ref Range Status   Specimen Description   Final    URINE, RANDOM Performed at [facility], [address] [place], [place] [address]    Special Requests   Final    NONE Performed at [facility], [address] [place], [place] [address]    Culture (A)  Final    >=100,000 COLONIES/mL ENTEROCOCCUS FAECALIS VANCOMYCIN RESISTANT ENTEROCOCCUS ISOLATED    Report Status 07/21/2020 FINAL  Final   Organism ID, Bacteria ENTEROCOCCUS FAECALIS (A)  Final      Susceptibility   Enterococcus faecalis - MIC*    AMPICILLIN <=2 SENSITIVE Sensitive     NITROFURANTOIN <=16 SENSITIVE Sensitive     VANCOMYCIN >=32 RESISTANT Resistant     LINEZOLID 2 SENSITIVE Sensitive     * >=100,000 COLONIES/mL ENTEROCOCCUS FAECALIS  MRSA PCR Screening     Status: Abnormal   Collection Time: 07/19/20  3:10 AM   Specimen: Nasal Mucosa; Nasopharyngeal  Result Value Ref Range Status   MRSA by PCR POSITIVE (A) NEGATIVE Final    Comment:        The GeneXpert MRSA Assay (FDA approved for NASAL specimens only), is one component of a comprehensive MRSA colonization surveillance program. It is not intended to diagnose MRSA infection nor to guide or monitor treatment for MRSA infections. RESULT CALLED TO, READ BACK BY AND VERIFIED WITH: STACY CLAY AT 0609 ON 07/19/2020 [place]. Performed at [facility], [address] [place], [place] [address]   Aerobic/Anaerobic Culture (surgical/deep wound)     Status: None (Preliminary result)   Collection Time: 07/22/20  3:00 PM   Specimen: PATH Other; Tissue  Result Value Ref Range Status   Specimen Description   Final    WOUND Performed at [facility], [address] [place], [place] [address]    Special Requests BONE  COCCYX  Final   Gram Stain   Final    NO WBC SEEN NO ORGANISMS SEEN Performed at [facility], [place] [address], [place], [place] [address]    Culture PENDING  Incomplete  Report Status PENDING  Incomplete  Aerobic/Anaerobic Culture (surgical/deep wound)     Status: None (Preliminary result)   Collection Time: 07/22/20  3:04 PM   Specimen: PATH Other; Tissue  Result Value Ref Range Status   Specimen Description   Final    WOUND Performed at [facility], [address], [place], [place] [address]    Special Requests FECAL DECUBITIS  Final   Gram Stain   Final    NO WBC SEEN NO ORGANISMS SEEN Performed at [facility], [place] [address] [place], [place] [address]    Culture PENDING  Incomplete   Report Status PENDING  Incomplete         Radiology Studies: No results found.      Scheduled Meds: . Chlorhexidine Gluconate Cloth  6 each Topical Daily  . collagenase   Topical Daily  . feeding supplement  237 mL Oral TID BM  . insulin aspart  0-6 Units Subcutaneous Q4H  . multivitamin with minerals  1 tablet Oral Daily  . mupirocin ointment  1 application Nasal BID   Continuous Infusions: . sodium chloride Stopped (07/23/20 1134)  . ampicillin-sulbactam (UNASYN) IV Stopped (07/23/20 0209)     LOS: 5 days    Time spent: 25 minutes    Sidney Ace, MD [facility] Pager [phone]  If 7PM-7AM, please contact night-coverage 07/23/2020, 12:30 PM

## 2020-07-23 NOTE — NC FL2 (Signed)
[place] MEDICAID FL2 LEVEL OF CARE SCREENING TOOL     IDENTIFICATION  Patient Name: [name] Birthdate: 1929-09-18 Sex: female Admission Date (Current Location): 07/18/2020  [facility] and [place] Number:  [name] (Was staying at [facility]. Husband lives in [place] and Address:  [facility], [address], [place], [place] [address]      Provider Number: [id number]  Attending Physician Name and Address:  Sidney Ace, MD  Relative Name and Phone Number:       Current Level of Care: Hospital Recommended Level of Care: [place] Prior Approval Number:    Date Approved/Denied:   PASRR Number: [id number]  Discharge Plan: SNF    Current Diagnoses: Patient Active Problem List   Diagnosis Date Noted  . Sacral decubitus ulcer, stage IV ([place]) 07/22/2020  . Dehydration 07/18/2020  . Hyperkalemia 07/18/2020  . CAP (community acquired pneumonia) 07/18/2020  . Acute metabolic encephalopathy 62/22/9798  . Closed burst fracture of lumbar vertebra ([place]) 04/03/2020  . Acute respiratory failure with hypoxia and hypercapnia ([place]) 04/03/2020  . ATN (acute tubular necrosis) ([place]) 04/03/2020  . H/O bad fall 04/03/2020  . HCAP (healthcare-associated pneumonia) 04/03/2020  . Hematochezia 05/29/2019  . Diverticulosis   . Swelling of vagina 04/16/2019  . Skin lesion 03/26/2019  . Left leg cellulitis 01/15/2019  . Healed diabetic foot ulcer 12/01/2017  . Abnormal TSH 11/15/2016  . Preventative health care 04/20/2016  . Allergic rhinitis 04/20/2016  . Dysuria 12/09/2014  . External hemorrhoids without complication 92/04/9416  . Left ear pain 07/08/2014  . Hip pain   . GIB (gastrointestinal bleeding) 06/18/2014  . UTI (lower urinary tract infection) 06/18/2014  . Abdominal pain   . Lower GI bleed   . Arthritis 02/18/2014  . Enteritis due to Clostridium difficile 10/12/2013  . CKD (chronic kidney  disease) stage 3, GFR 30-59 ml/min ([name]) 10/11/2013  . Chronic diastolic CHF (congestive heart failure) ([place]) 10/11/2013  . Protein-calorie malnutrition, severe ([place]) 10/11/2013  . Hx of necrotizing fasciitis 10/10/2013  . GI bleed 10/10/2013  . AKI (acute kidney injury) ([place]) 10/10/2013  . Cough 09/10/2013  . Rectal bleeding 09/10/2013  . Acute on chronic diastolic CHF (congestive heart failure) ([place]) 08/31/2013  . Rash and nonspecific skin eruption 08/27/2013  . Pulmonary edema 08/25/2013  . Severe sepsis with septic shock ([place]) 08/13/2013  . Necrotizing fasciitis of perineum 08/12/2013  . Acute on chronic kidney disease, stage 3 08/12/2013  . Wheezing 06/07/2013  . Wound healing, delayed 01/26/2012  . Diverticulosis of colon with hemorrhage 12/19/2011  . Acute blood loss anemia 12/19/2011  . Malignant neoplasm of upper-outer quadrant of right breast in female, estrogen receptor positive ([place]) 08/01/2011  . Breast cancer, right breast-lobular s/p lumpectomy and sentinel node biopsy 06/30/2011  . Shingles 02/21/2011  . Bile duct calculus with nonacute cholecystitis 01/21/2011  . Morbid obesity ([place]) 01/21/2011  . TREMOR 08/17/2010  . Diabetes ([place]) 01/09/2008  . PERIPHERAL NEUROPATHY 10/17/2007  . FATIGUE 10/17/2007  . COLONIC POLYPS, HX OF 02/12/2007  . Hyperlipidemia 02/10/2007  . Gout 02/10/2007  . ANXIETY 02/10/2007  . Essential hypertension 02/10/2007  . CVA 02/10/2007  . DIVERTICULOSIS, COLON 02/10/2007  . IRRITABLE BOWEL, PREDOMINANTLY CONSTIPATION 02/10/2007    Orientation RESPIRATION BLADDER Height & Weight      (Disoriented x 4)  O2 (2 L Nasal Canula) Incontinent,Indwelling catheter Weight: 216 lb 0.8 oz (98 kg) Height:  5\' 5"  (165.1 cm)  BEHAVIORAL SYMPTOMS/MOOD  NEUROLOGICAL BOWEL NUTRITION STATUS  Other (Comment) (Anxious, restless)  (None) Continent Diet (Full liquid. Full supervision/assist. Meds crushed in puree.)  AMBULATORY STATUS COMMUNICATION OF NEEDS  Skin   Total Care Verbally Wound Vac,Skin abrasions,Bruising,Other (Comment),Surgical wounds,PU Stage and Appropriate Care (Unstageable pressure injuries on medial sacrum (foam) and left heel (foam). Incision on coccyx (Wound vac. Dressing changes MWF).)   PU Stage 2 Dressing:  (Mid lumbar: Foam)                   Personal Care Assistance Level of Assistance  Bathing,Feeding,Dressing Bathing Assistance: Maximum assistance Feeding assistance: Maximum assistance Dressing Assistance: Maximum assistance Total Care Assistance: Maximum assistance   Functional Limitations Info  Sight,Hearing,Speech Sight Info: Adequate Hearing Info: Adequate Speech Info: Adequate    SPECIAL CARE FACTORS FREQUENCY  PT (By licensed PT),OT (By licensed OT),Speech therapy     PT Frequency: 5 x week OT Frequency: 5 x week     Speech Therapy Frequency: 5 x week      Contractures Contractures Info: Not present    Additional Factors Info  Code Status,Allergies Code Status Info: Full code Allergies Info: Ace Inhibitors, Atorvastatin, Oxycodone-acetaminophen, Rosuvastatin, Simvastatin, Codeine, Sulfonamide Derivatives           Current Medications (07/23/2020):  This is the current hospital active medication list Current Facility-Administered Medications  Medication Dose Route Frequency Provider Last Rate Last Admin  . 0.45 % sodium chloride infusion   Intravenous Continuous Olean Ree, MD 75 mL/hr at 07/23/20 0824 New Bag at 07/23/20 0824  . acetaminophen (TYLENOL) tablet 1,000 mg  1,000 mg Oral Q6H PRN Piscoya, Jose, MD      . albuterol (PROVENTIL) (2.5 MG/3ML) 0.083% nebulizer solution 2.5 mg  2.5 mg Nebulization Q2H PRN Piscoya, Jose, MD      . Ampicillin-Sulbactam (UNASYN) 3 g in sodium chloride 0.9 % 100 mL IVPB  3 g Intravenous Q12H Piscoya, Jose, MD 200 mL/hr at 07/23/20 0139 3 g at 07/23/20 0139  . collagenase (SANTYL) ointment   Topical Daily Piscoya, Jose, MD      . feeding supplement  (ENSURE ENLIVE / ENSURE PLUS) liquid 237 mL  237 mL Oral TID BM Piscoya, Jose, MD   237 mL at 07/23/20 0826  . insulin aspart (novoLOG) injection 0-6 Units  0-6 Units Subcutaneous Q4H Piscoya, Jose, MD      . ipratropium-albuterol (DUONEB) 0.5-2.5 (3) MG/3ML nebulizer solution 3 mL  3 mL Nebulization Q6H PRN Piscoya, Jose, MD      . multivitamin with minerals tablet 1 tablet  1 tablet Oral Daily Piscoya, Jose, MD   1 tablet at 07/23/20 0826  . mupirocin ointment (BACTROBAN) 2 % 1 application  1 application Nasal BID Olean Ree, MD   1 application at 14/48/18 2200  . traMADol (ULTRAM) tablet 50 mg  50 mg Oral Q6H PRN Olean Ree, MD         Discharge Medications: Please see discharge summary for a list of discharge medications.  Relevant Imaging Results:  Relevant Lab Results:   Additional Information SS#: [id number]. Was paying privately for LTC at [facility]. Family wants her closer to home in the [place] area.  Candie Chroman, LCSW

## 2020-07-23 NOTE — Progress Notes (Signed)
ID Pt confused and disoriented Says she is okay Patient Vitals for the past 24 hrs:  BP Temp Temp src Pulse Resp SpO2  07/23/20 1610 (!) 131/42 98.9 F (37.2 C) Oral - - -  07/23/20 1220 - - - 99 - -  07/23/20 1153 135/69 97.9 F (36.6 C) - 100 20 100 %  07/23/20 0800 (!) 146/48 98 F (36.7 C) Oral 89 19 99 %  07/23/20 0524 (!) 127/100 98.6 F (37 C) Oral 95 16 100 %  07/22/20 2347 110/62 98.8 F (37.1 C) Oral - - -  07/22/20 2210 (!) 161/73 98.5 F (36.9 C) Oral 90 16 96 %  07/22/20 2008 (!) 140/91 97.7 F (36.5 C) Oral 95 20 100 %   chest b/l air entry [id number] Sacral wound covered wth wound vac Mittens to hands Foley catheter Labs CBC Latest Ref Rng & Units 07/23/2020 07/22/2020 07/19/2020  WBC 4.0 - 10.5 K/uL 6.2 10.3 9.1  Hemoglobin 12.0 - 15.0 g/dL 10.5(L) 10.5(L) 10.0(L)  Hematocrit 36.0 - 46.0 % 35.6(L) 33.7(L) 34.0(L)  Platelets 150 - 400 K/uL 270 278 310   CMP Latest Ref Rng & Units 07/23/2020 07/22/2020 07/21/2020  Glucose 70 - 99 mg/dL 133(H) 107(H) 89  BUN 8 - 23 mg/dL 63(H) 70(H) 95(H)  Creatinine 0.44 - 1.00 mg/dL 1.76(H) 1.82(H) 2.44(H)  Sodium 135 - 145 mmol/L 147(H) 148(H) 147(H)  Potassium 3.5 - 5.1 mmol/L 4.2 3.5 3.9  Chloride 98 - 111 mmol/L 107 108 110  CO2 22 - 32 mmol/L 23 24 24   Calcium 8.9 - 10.3 mg/dL 9.8 10.0 9.6  Total Protein 6.5 - 8.1 g/dL - - -  Total Bilirubin 0.3 - 1.2 mg/dL - - -  Alkaline Phos 38 - 126 U/L - - -  AST 15 - 41 U/L - - -  ALT 0 - 44 U/L - - -    Micro Wound culture pending Blood culture from 07/18/2020 negative Urine culture Enterococcus PCM  Impression/recommendation 87 year old female presented from the nursing home with hypotension  Hypotension due to dehydration (resolved  Acute metabolic encephalopathy which is fluctuating.  Not sure what her baseline mental status is.   Unstageable sacral decubitus.  Underwent debridement on 07/22/2020.  Has a wound VAC.  Cultures sent.  On Unasyn  AKI due to dehydration.   Creatinine was 4.15 on admission and now it is 1.76.  Patient hydrated Renal ultrasound did not show any hydronephrosis or stone.  Enterococcus in the urine is likely colonization of the Foley catheter.  Bilateral basilar atelectasis.  Recent L3 burst fracture needing surgery with hardware L2-L5.  This was complicated by wound dehiscence and infection with E. coli and Proteus for which she was treated with 8 weeks of antibiotics from November until June 19, 2020.  Initially it was intravenous ceftriaxone followed by p.o. ciprofloxacin.  The wound has healed.  On reviewing the medical records it looks like the patient has declined in the last few months.  Agree with getting palliative consult and on board.  Discussed the management with hospitalist.

## 2020-07-24 DIAGNOSIS — G9341 Metabolic encephalopathy: Secondary | ICD-10-CM | POA: Diagnosis not present

## 2020-07-24 DIAGNOSIS — E43 Unspecified severe protein-calorie malnutrition: Secondary | ICD-10-CM | POA: Diagnosis not present

## 2020-07-24 DIAGNOSIS — E860 Dehydration: Secondary | ICD-10-CM | POA: Diagnosis not present

## 2020-07-24 DIAGNOSIS — T148XXD Other injury of unspecified body region, subsequent encounter: Secondary | ICD-10-CM | POA: Diagnosis not present

## 2020-07-24 DIAGNOSIS — L89153 Pressure ulcer of sacral region, stage 3: Secondary | ICD-10-CM | POA: Diagnosis not present

## 2020-07-24 DIAGNOSIS — N179 Acute kidney failure, unspecified: Secondary | ICD-10-CM | POA: Diagnosis not present

## 2020-07-24 LAB — CBC WITH DIFFERENTIAL/PLATELET
Abs Immature Granulocytes: 0.23 10*3/uL — ABNORMAL HIGH (ref 0.00–0.07)
Basophils Absolute: 0 10*3/uL (ref 0.0–0.1)
Basophils Relative: 0 %
Eosinophils Absolute: 0 10*3/uL (ref 0.0–0.5)
Eosinophils Relative: 0 %
HCT: 35.4 % — ABNORMAL LOW (ref 36.0–46.0)
Hemoglobin: 10.8 g/dL — ABNORMAL LOW (ref 12.0–15.0)
Immature Granulocytes: 2 %
Lymphocytes Relative: 11 %
Lymphs Abs: 1.2 10*3/uL (ref 0.7–4.0)
MCH: 27 pg (ref 26.0–34.0)
MCHC: 30.5 g/dL (ref 30.0–36.0)
MCV: 88.5 fL (ref 80.0–100.0)
Monocytes Absolute: 0.5 10*3/uL (ref 0.1–1.0)
Monocytes Relative: 5 %
Neutro Abs: 8.9 10*3/uL — ABNORMAL HIGH (ref 1.7–7.7)
Neutrophils Relative %: 82 %
Platelets: 320 10*3/uL (ref 150–400)
RBC: 4 MIL/uL (ref 3.87–5.11)
RDW: 18.2 % — ABNORMAL HIGH (ref 11.5–15.5)
WBC: 10.9 10*3/uL — ABNORMAL HIGH (ref 4.0–10.5)
nRBC: 0 % (ref 0.0–0.2)

## 2020-07-24 LAB — GLUCOSE, CAPILLARY
Glucose-Capillary: 101 mg/dL — ABNORMAL HIGH (ref 70–99)
Glucose-Capillary: 114 mg/dL — ABNORMAL HIGH (ref 70–99)
Glucose-Capillary: 118 mg/dL — ABNORMAL HIGH (ref 70–99)
Glucose-Capillary: 103 mg/dL — ABNORMAL HIGH (ref 70–99)
Glucose-Capillary: 92 mg/dL (ref 70–99)

## 2020-07-24 LAB — BASIC METABOLIC PANEL
Anion gap: 15 (ref 5–15)
BUN: 61 mg/dL — ABNORMAL HIGH (ref 8–23)
CO2: 26 mmol/L (ref 22–32)
Calcium: 10.1 mg/dL (ref 8.9–10.3)
Chloride: 106 mmol/L (ref 98–111)
Creatinine, Ser: 1.5 mg/dL — ABNORMAL HIGH (ref 0.44–1.00)
GFR, Estimated: 33 mL/min — ABNORMAL LOW (ref >60)
Glucose, Bld: 121 mg/dL — ABNORMAL HIGH (ref 70–99)
Potassium: 3.8 mmol/L (ref 3.5–5.1)
Sodium: 147 mmol/L — ABNORMAL HIGH (ref 135–145)

## 2020-07-24 LAB — MAGNESIUM: Magnesium: 1.8 mg/dL (ref 1.7–2.4)

## 2020-07-24 MED ORDER — MIRTAZAPINE 15 MG PO TABS
15.0000 mg | ORAL_TABLET | Freq: Every day | ORAL | Status: DC
Start: 2020-07-24 — End: 2020-08-10
  Administered 2020-07-24 – 2020-08-09 (×15): 15 mg via ORAL
  Filled 2020-07-24 (×17): qty 1

## 2020-07-24 MED ORDER — ASCORBIC ACID 500 MG PO TABS
250.0000 mg | ORAL_TABLET | Freq: Two times a day (BID) | ORAL | Status: DC
  Administered 2020-07-24 – 2020-08-10 (×27): 250 mg via ORAL
  Filled 2020-07-24 (×33): qty 1

## 2020-07-24 NOTE — Progress Notes (Signed)
[facility] Day(s): 6.   Post op day(s): 2 Days Post-Op.   Interval History:  Patient seen and examined No acute events or new complaints overnight.  Patient with history of dementia; unable to participate in history Slight leukocytosis this morning; 10.9K; no fevers Renal function improved; sCr - 1.50 Persistent hypernatremia to 147  Cx from OR on 02/09 pending; NGTD Wound vac bridged to left hip; good seal; plan on bedside change today with WOC RN She continues on Unasyn; ID following   Vital signs in last 24 hours: [min-max] current  Temp:  [97.8 F (36.6 C)-98.9 F (37.2 C)] 98 F (36.7 C) (02/11 0516) Pulse Rate:  [67-100] 85 (02/11 0516) Resp:  [16-20] 16 (02/11 0516) BP: (131-146)/(42-71) 139/71 (02/11 0516) SpO2:  [99 %-100 %] 100 % (02/11 0001)     Height: 5\' 5"  (165.1 cm) Weight: 98 kg BMI (Calculated): 35.95   Intake/Output last 2 shifts:  02/10 0701 - 02/11 0700 In: 946.3 [P.O.:610; I.V.:236.3; IV Piggyback:100] Out: 825 [Urine:800; Drains:25]   Physical Exam:  Constitutional: alert, resting in bed, confused Respiratory: breathing non-labored at rest  Cardiovascular: regular rate and sinus rhythm  Musculoskeletal: Safety mittens to upper extremities Integumentary: Large sacral wound, s/p debridement, bone palpable, no evidence of infection  Sacral Wound  (07/24/2020):     Labs:  CBC Latest Ref Rng & Units 07/24/2020 07/23/2020 07/22/2020  WBC 4.0 - 10.5 K/uL 10.9(H) 6.2 10.3  Hemoglobin 12.0 - 15.0 g/dL 10.8(L) 10.5(L) 10.5(L)  Hematocrit 36.0 - 46.0 % 35.4(L) 35.6(L) 33.7(L)  Platelets 150 - 400 K/uL 320 270 278   CMP Latest Ref Rng & Units 07/23/2020 07/22/2020 07/21/2020  Glucose 70 - 99 mg/dL 133(H) 107(H) 89  BUN 8 - 23 mg/dL 63(H) 70(H) 95(H)  Creatinine 0.44 - 1.00 mg/dL 1.76(H) 1.82(H) 2.44(H)  Sodium 135 - 145 mmol/L 147(H) 148(H) 147(H)  Potassium 3.5 - 5.1 mmol/L 4.2 3.5 3.9  Chloride 98 - 111  mmol/L 107 108 110  CO2 22 - 32 mmol/L 23 24 24   Calcium 8.9 - 10.3 mg/dL 9.8 10.0 9.6  Total Protein 6.5 - 8.1 g/dL - - -  Total Bilirubin 0.3 - 1.2 mg/dL - - -  Alkaline Phos 38 - 126 U/L - - -  AST 15 - 41 U/L - - -  ALT 0 - 44 U/L - - -    Imaging studies: No new pertinent imaging studies   Assessment/Plan: 87 y.o. female 2 Days Post-Op s/p incision and debridement with wound vac placement for large unstageable sacral pressure injury, complicated by pertinent comorbidities includingadvanced age, frailty, and dementia.   - Continue wound vac; MWF schedule. We help WOC RN with bedside change this morning   - Continue IV ABx (Unasyn); ID on board    - follow up wound Cx from OR   - Pain control prn   - Recommend continue local wound care, frequent repositioning +/- low air loss mattress - Further management per primary service; agree with palliative consult    All of the above findings and recommendations were discussed with the medical team.  -- Edison Simon, PA-C [facility] 07/24/2020, 7:23 AM [phone] M-F: 7am - 4pm

## 2020-07-24 NOTE — Progress Notes (Signed)
PROGRESS NOTE    [name]  INO:[id number] DOB: [birth year] DOA: 07/18/2020 PCP: Biagio Borg, MD   Brief Narrative:  [name] Coveyis a 87 y.o.femalewith medical history significant of dementia, diastolic CHF s/p Lumbar fusion due to L3 frx and repair of traumatic CSF leakOct 2021,anemia, atrial flutter, breast cancer status post radiation therapy, CKD stage III, history of CVA, history of DVT left leg postoperatively, is a esophageal stricture,GERD, C. difficile infection,left foot pressure ulcer,severe osteopenia,falls.Presented withHypotension initial BP at facility down to 96/34 EMS gave 500 ml bolus and BP was 131/48 Per nursing staff patient have had decreased urine output. She has been somnolent/confused while here. She is found with AKI, Ucx+, and PNA . Ct head obtained +for acute intrav. Bleed, but trace. Neurosurgery was consulted. Son [name] was notified.  Consulted palliative care. Family wants to keep her full code for now.ID consulted.  2/6-confused, quiet.  2/7-sitting up in bed, still confused, mumbling something. (spoke to son, states since October she has been "mumbling" and not being her self. Prior to October (before surgery) pt apparently was active and sharp. 2/8-somnolent today. Does not open eyes for me. Per nsg does take meds . CT head obtained-see result 2/9: Patient more interactive.  Nonsensical speech.  Does not appear in any distress.  Patient n.p.o. for surgical debridement today.  Remains on IV antibiotics. 2/10: Postop day 1 status post wound debridement with general surgery.  Tolerated procedure well.  Wound VAC in place.  Seen by general surgery this morning. 2/11: Palliative care discussed with patient's son [name].  At this time still pursuing full scope of treatment including full CODE STATUS.   Assessment & Plan:   Active Problems:   Diabetes ([place])   Hyperlipidemia   Essential hypertension   Malignant neoplasm of upper-outer quadrant  of right breast in female, estrogen receptor positive ([place])   Wound healing, delayed   AKI (acute kidney injury) ([place])   CKD (chronic kidney disease) stage 3, GFR 30-59 ml/min ([name])   Chronic diastolic CHF (congestive heart failure) ([name])   Protein-calorie malnutrition, severe ([name])   Dehydration   Hyperkalemia   CAP (community acquired pneumonia)   Acute metabolic encephalopathy   Sacral decubitus ulcer, stage IV ([name])  Acute metabolic encephalopathy Suspected hospital-acquired delirium  most likely multifactorial secondary to combination of infection ,dehydration, AKI, and acute intraventricular hemorrhage.  -Baseline mental status difficult to ascertain given underlying dementia -Per neurosurgery-no further inpatient intervention necessary Plan: Hypotonic fluids for now considering hypernatremia Monitor UOP and ensure no retention Continue Unasyn Frequent neuro checks Avoid sedatives Ensure pain control As needed atypical antipsychotics for agitation  Hypernatremia Persistent despite hypotonic fluids Likely owing to poor p.o. fluid intake P.o. fluid intake slowly improving Plan: Continue half-normal saline for now We will attempt to discontinue within the next 24 hours if sodium improves  UTI +UA and ucx with Enter faeca.  Vancomycin-resistant Enterococcus isolated. ID consulted. Per ID Enterococcus is likely is contaminant Confirmed with RN, Foley was exchanged on admission including tubing and bag Plan: Continue Unasyn per infectious disease recommendations   Possible pneumonia, unlikely Per ID chest x-ray likely represents atelectasis White blood cell high level of normal Procalcitonin trending down Plan: On Unasyn for wound infection Oxygen as needed  Acute intraventricular hemorrhage-Trace .  Neurosurgery consulted Cleared for surgery No further intervention   Sacral decubitus ulcer Status post or debridement 07/22/2020 Tolerated debridement, wound  VAC placed Surgery following for postoperative wound checks Plan:  Continue Unasyn  Continue wound VAC WOC consult Pain control as needed  Surgical follow-up  Protein-calorie malnutrition, severe  Speech and RD following  Hyperlipidemia -chronic stable not on statin  Essential hypertension hold home medications given hypotension on admission   Chronic diastolic CHF  currently appears to be in a dry side Hold lasix  AKI(acute kidney injury) ([place]) -appears to be prerenal-dehydration, on ACE and Lasix.   Hold lisinopril rehydrate and continue to follow RENAL US showing no evidence of hydronephrosis 2/11: Creatinine continues to improve.  1.5 today Continue to monitor   Dehydration-continue with IV fluid  DM2--controlled A1c 5.6 TSH 5.4, will need recheck in 4 weeks when acute phase illness improves    DVT prophylaxis: SCD Code Status: Full Family Communication: [name] [phone] on 07/24/2020 Disposition Plan: Status is: Inpatient  Remains inpatient appropriate because:Inpatient level of care appropriate due to severity of illness   Dispo: The patient is from: Home              Anticipated d/c is to: SNF              Anticipated d/c date is: > 3 days              Patient currently is not medically stable to d/c.   Difficult to place patient No  Disposition plan pending.  Patient postop day 1 status post surgical debridement.  Wound VAC in place.  Will need placement post discharge.  Palliative care to follow-up with family regarding goals of care.     Level of care: Med-Surg  Consultants:   General surgery  Palliative care  Procedures:   Surgical debridement of sacral decubitus ulcer, 07/22/2020  Antimicrobials:   Unasyn   Subjective: Patient seen and examined.  Pleasant this morning.  No visible distress.  Speech does not make much sense.  Objective: Vitals:   07/23/20 2015 07/23/20 2043 07/24/20 0001 07/24/20 0516  BP:   (!) 145/49 (!) 139/50 139/71  Pulse: 67 79 72 85  Resp: 16 18 16 16   Temp:  97.8 F (36.6 C) 97.8 F (36.6 C) 98 F (36.7 C)  TempSrc:  Axillary Oral Oral  SpO2: 100% 100% 100%   Weight:      Height:        Intake/Output Summary (Last 24 hours) at 07/24/2020 1033 Last data filed at 07/24/2020 0842 Gross per 24 hour  Intake 546.31 ml  Output 1025 ml  Net -478.69 ml   Filed Weights   07/18/20 1603  Weight: 98 kg    Examination:  General exam: No acute distress.  Confused.  Frail Respiratory system: Bibasilar crackles.  Normal work of breathing.  Room air Cardiovascular system: S1-S2, regular rate and rhythm,+murmur Gastrointestinal system: Soft, nontender, nondistended, normal bowel sounds Central nervous system: Alert, confused, oriented x1, no focal deficits Extremities: Symmetric 5 x 5 power. Skin: Sacral decubitus status post debridement.  Wound VAC in place Psychiatry: Judgement and insight appear impaired. Mood & affect flattened.     Data Reviewed: I have personally reviewed following labs and imaging studies  CBC: Recent Labs  Lab 07/18/20 1554 07/19/20 0311 07/22/20 0751 07/23/20 0402 07/24/20 0707  WBC 11.9* 9.1 10.3 6.2 10.9*  NEUTROABS 6.7 7.1 7.7 5.3 8.9*  HGB 10.1* 10.0* 10.5* 10.5* 10.8*  HCT 32.6* 34.0* 33.7* 35.6* 35.4*  MCV 90.6 92.4 89.6 90.4 88.5  PLT 320 310 278 270 790   Basic Metabolic Panel: Recent Labs  Lab 07/18/20 2342 07/19/20 0311 07/20/20 0424  07/21/20 0421 07/22/20 0751 07/23/20 0402 07/24/20 0707  NA 139 139 148* 147* 148* 147* 147*  K 5.3* 5.4* 4.5 3.9 3.5 4.2 3.8  CL 102 102 108 110 108 107 106  CO2 25 25 26 24 24 23 26   GLUCOSE 130* 102* 100* 89 107* 133* 121*  BUN 129* 128* 111* 95* 70* 63* 61*  CREATININE 3.94* 3.93* 3.01* 2.44* 1.82* 1.76* 1.50*  CALCIUM 10.6* 10.8* 10.1 9.6 10.0 9.8 10.1  MG 2.6* 2.6*  --   --  1.8 1.8 1.8  PHOS  --  6.4*  --   --   --   --   --    GFR: Estimated Creatinine Clearance: 30  mL/min (A) (by C-G formula based on SCr of 1.5 mg/dL (H)). Liver Function Tests: Recent Labs  Lab 07/18/20 1554 07/19/20 0311  AST 24 28  ALT 14 15  ALKPHOS 83 85  BILITOT 0.7 0.8  PROT 7.0 6.9  ALBUMIN 2.4* 2.4*   No results for input(s): LIPASE, AMYLASE in the last 168 hours. No results for input(s): AMMONIA in the last 168 hours. Coagulation Profile: No results for input(s): INR, PROTIME in the last 168 hours. Cardiac Enzymes: Recent Labs  Lab 07/18/20 2342 07/19/20 0311  CKTOTAL 373* 460*   BNP (last 3 results) No results for input(s): PROBNP in the last 8760 hours. HbA1C: No results for input(s): HGBA1C in the last 72 hours. CBG: Recent Labs  Lab 07/23/20 1606 07/23/20 2034 07/23/20 2356 07/24/20 0502 07/24/20 0904  GLUCAP 153* 122* 183* 101* 114*   Lipid Profile: No results for input(s): CHOL, HDL, LDLCALC, TRIG, CHOLHDL, LDLDIRECT in the last 72 hours. Thyroid Function Tests: No results for input(s): TSH, T4TOTAL, FREET4, T3FREE, THYROIDAB in the last 72 hours. Anemia Panel: No results for input(s): VITAMINB12, FOLATE, FERRITIN, TIBC, IRON, RETICCTPCT in the last 72 hours. Sepsis Labs: Recent Labs  Lab 07/18/20 1554 07/18/20 2105 07/19/20 0311 07/20/20 0424  PROCALCITON 0.51  --  0.46 0.38  LATICACIDVEN 1.0 0.9  --   --     Recent Results (from the past 240 hour(s))  Blood Culture (routine x 2)     Status: None   Collection Time: 07/18/20  4:30 PM   Specimen: BLOOD  Result Value Ref Range Status   Specimen Description BLOOD BLOOD RIGHT FOREARM  Final   Special Requests   Final    BOTTLES DRAWN AEROBIC AND ANAEROBIC Blood Culture adequate volume   Culture   Final    NO GROWTH 5 DAYS Performed at [facility], [address] [place], [place] [address]    Report Status 07/23/2020 FINAL  Final  SARS Coronavirus 2 by RT PCR (hospital order, performed in [place] hospital lab) Nasopharyngeal Nasopharyngeal Swab     Status: None    Collection Time: 07/18/20  5:39 PM   Specimen: Nasopharyngeal Swab  Result Value Ref Range Status   SARS Coronavirus 2 NEGATIVE NEGATIVE Final    Comment: (NOTE) SARS-CoV-2 target nucleic acids are NOT DETECTED.  The SARS-CoV-2 RNA is generally detectable in upper and lower respiratory specimens during the acute phase of infection. The lowest concentration of SARS-CoV-2 viral copies this assay can detect is 250 copies / mL. A negative result does not preclude SARS-CoV-2 infection and should not be used as the sole basis for treatment or other patient management decisions.  A negative result may occur with improper specimen collection / handling, submission of specimen other than nasopharyngeal swab, presence of  viral mutation(s) within the areas targeted by this assay, and inadequate number of viral copies (<250 copies / mL). A negative result must be combined with clinical observations, patient history, and epidemiological information.  Fact Sheet for Patients:   StrictlyIdeas.no  Fact Sheet for Healthcare Providers: BankingDealers.co.za  This test is not yet approved or  cleared by the [place] FDA and has been authorized for detection and/or diagnosis of SARS-CoV-2 by FDA under an Emergency Use Authorization (EUA).  This EUA will remain in effect (meaning this test can be used) for the duration of the COVID-19 declaration under Section 564(b)(1) of the Act, 21 U.S.C. section 360bbb-3(b)(1), unless the authorization is terminated or revoked sooner.  Performed at [facility], [address] [place], [place] [address]   Blood Culture (routine x 2)     Status: None   Collection Time: 07/18/20  6:40 PM   Specimen: BLOOD  Result Value Ref Range Status   Specimen Description BLOOD RIGHT ANTECUBITAL  Final   Special Requests   Final    BOTTLES DRAWN AEROBIC AND ANAEROBIC Blood Culture adequate volume   Culture    Final    NO GROWTH 5 DAYS Performed at [facility], [address], [place], [place] [address]    Report Status 07/23/2020 FINAL  Final  Urine culture     Status: Abnormal   Collection Time: 07/18/20  8:26 PM   Specimen: Urine, Random  Result Value Ref Range Status   Specimen Description   Final    URINE, RANDOM Performed at [facility], [address] [place], [place] [address]    Special Requests   Final    NONE Performed at [facility], [address] [place], [place] [address]    Culture (A)  Final    >=100,000 COLONIES/mL ENTEROCOCCUS FAECALIS VANCOMYCIN RESISTANT ENTEROCOCCUS ISOLATED    Report Status 07/21/2020 FINAL  Final   Organism ID, Bacteria ENTEROCOCCUS FAECALIS (A)  Final      Susceptibility   Enterococcus faecalis - MIC*    AMPICILLIN <=2 SENSITIVE Sensitive     NITROFURANTOIN <=16 SENSITIVE Sensitive     VANCOMYCIN >=32 RESISTANT Resistant     LINEZOLID 2 SENSITIVE Sensitive     * >=100,000 COLONIES/mL ENTEROCOCCUS FAECALIS  MRSA PCR Screening     Status: Abnormal   Collection Time: 07/19/20  3:10 AM   Specimen: Nasal Mucosa; Nasopharyngeal  Result Value Ref Range Status   MRSA by PCR POSITIVE (A) NEGATIVE Final    Comment:        The GeneXpert MRSA Assay (FDA approved for NASAL specimens only), is one component of a comprehensive MRSA colonization surveillance program. It is not intended to diagnose MRSA infection nor to guide or monitor treatment for MRSA infections. RESULT CALLED TO, READ BACK BY AND VERIFIED WITH: STACY CLAY AT 0609 ON 07/19/2020 [place]. Performed at [facility], [address] [place], [place] [address]   Aerobic/Anaerobic Culture (surgical/deep wound)     Status: None (Preliminary result)   Collection Time: 07/22/20  3:00 PM   Specimen: PATH Other; Tissue  Result Value Ref Range Status   Specimen Description   Final    WOUND Performed at [facility], [address] [place], [place] [address]    Special Requests BONE COCCYX  Final   Gram Stain   Final    NO WBC SEEN NO ORGANISMS SEEN Performed at [facility], [place],  [place] [address]    Culture PENDING  Incomplete   Report Status PENDING  Incomplete  Aerobic/Anaerobic Culture (surgical/deep wound)     Status: None (Preliminary result)   Collection Time: 07/22/20  3:04 PM   Specimen: PATH Other; Tissue  Result Value Ref Range Status   Specimen Description   Final    WOUND Performed at [facility], [address] [place], [place] [address]    Special Requests FECAL DECUBITIS  Final   Gram Stain   Final    NO WBC SEEN NO ORGANISMS SEEN Performed at [facility], [place] [address] [place], [place] [address]    Culture PENDING  Incomplete   Report Status PENDING  Incomplete         Radiology Studies: No results found.      Scheduled Meds: . allopurinol  100 mg Oral Daily  . Chlorhexidine Gluconate Cloth  6 each Topical Daily  . feeding supplement  237 mL Oral TID BM  . insulin aspart  0-6 Units Subcutaneous Q4H  . multivitamin with minerals  1 tablet Oral Daily   Continuous Infusions: . sodium chloride 75 mL/hr at 07/24/20 0044  . ampicillin-sulbactam (UNASYN) IV 3 g (07/24/20 0021)     LOS: 6 days    Time spent: 25 minutes    Sidney Ace, MD [facility] Pager [phone]  If 7PM-7AM, please contact night-coverage 07/24/2020, 10:33 AM

## 2020-07-24 NOTE — Progress Notes (Signed)
PT Cancellation Note  Patient Details Name: [name] MRN: [id number] DOB: 1929/07/20   Cancelled Treatment:    Reason Eval/Treat Not Completed: Other (comment).  Chart reviewed.  Nursing reported pt with recent pain meds and wound vac change.  Pt initially with eyes open when therapist started talking with pt but then pt closed her eyes and did not open eyes again rest of attempted session; pt laying in bed talking to herself but did not engage with any conversation with therapist; pt did not initiate any movement or participation with max cueing.  Will re-attempt PT session 1 more time, continue to assess pt's rehab potential, and update POC as appropriate.  Leitha Bleak, PT 07/24/20, 9:29 AM

## 2020-07-24 NOTE — Progress Notes (Signed)
  Speech Language Pathology Treatment: Dysphagia  Patient Details Name: [name] MRN: [id number] DOB: 1929/12/16 Today's Date: 07/24/2020 Time: 0623-7628 SLP Time Calculation (min) (ACUTE ONLY): 15 min  Assessment / Plan / Recommendation Clinical Impression  Pt seen at bedside to assess readiness to advance diet from full liquid to puree. Pt was awake, confused upon arrival of SLP. She yelled out when SLP attempted to reposition her to a more upright position, so her positioning was sub-optimal. Given esophageal history, upright position would mitigate aspiration risk. RN reports variable PO intake, and indicates pt likes salt on her food. Pt did accept trials of applesauce and water without overt s/s aspiration, and without obvious oral issues. Will advance diet to puree, and hopefully the savory/salty food choices will be more palatable to pt and will encourage her to accept more PO. Safe swallow precautions posted at [facility]. SLP will follow to assess diet tolerance and continue education.    HPI HPI: 87 y.o. female with medical history significant of dementia, diastolic CHF   s/p Lumbar fusion due to L3 frx and repair of traumatic CSF leak Oct 2021, anemia, atrial flutter, breast cancer status post radiation therapy, CKD stage III, history of CVA, history of DVT left leg postoperatively, esophageal stricture, GERD,  C. difficile infection, left foot pressure ulcer, severe osteopenia, falls who presented with hypotension. CXR showed possible PNA.      SLP Plan  Continue with current plan of care       Recommendations  Diet recommendations: Dysphagia 1 (puree);Thin liquid Liquids provided via: Straw Medication Administration: Crushed with puree Supervision: Full supervision/cueing for compensatory strategies;Trained caregiver to feed patient Compensations: Minimize environmental distractions;Slow rate;Small sips/bites;Monitor for anterior loss;Other (Comment) Postural Changes and/or  Swallow Maneuvers: Seated upright 90 degrees;Upright 30-60 min after meal                Oral Care Recommendations: Oral care QID Follow up Recommendations: Skilled Nursing facility;24 hour supervision/assistance SLP Visit Diagnosis: Dysphagia, oral phase (R13.11) Plan: Continue with current plan of care       [place]. Quentin Ore [facility], CCC-SLP Speech Language Pathologist [phone]  Shonna Chock 07/24/2020, 12:14 PM

## 2020-07-24 NOTE — Plan of Care (Signed)
Pt resting comfortably this shift. Attempted to feed patient 3 meals this shift, and she spit out breakfast and lunch, slept threw dinner. Tried to awaken and she hollered to leave her alone. Pt drank ice water many times this shift. Foley draining clear liquid. Wound vac minimal output. Morphine given x 2 this shift .  Problem: Clinical Measurements: Goal: Ability to maintain clinical measurements within normal limits will improve Outcome: Progressing Goal: Will remain free from infection Outcome: Progressing   Problem: Nutrition: Goal: Adequate nutrition will be maintained Outcome: Progressing

## 2020-07-24 NOTE — Progress Notes (Signed)
Nutrition Follow-up  DOCUMENTATION CODES:   Obesity unspecified  INTERVENTION:   Ensure Enlive po BID, each supplement provides 350 kcal and 20 grams of protein  Magic cup TID with meals, each supplement provides 290 kcal and 9 grams of protein  MVI daily  Vitamin C 274m po BID   Pt at high refeed risk; recommend monitor potassium, magnesium and phosphorus labs daily until stable  NUTRITION DIAGNOSIS:   Increased nutrient needs related to wound healing as evidenced by estimated needs.  GOAL:   Patient will meet greater than or equal to 90% of their needs  MONITOR:   PO intake,Supplement acceptance,Diet advancement,Labs,Weight trends,Skin,I & O's  REASON FOR ASSESSMENT:   Consult Assessment of nutrition requirement/status  ASSESSMENT:   87y.o. female with medical history significant of dementia, diastolic CHF   s/p Lumbar fusion due to L3 frx and repair of traumatic CSF leak Oct 2021, anemia, atrial flutter, breast cancer status post radiation therapy, CKD stage III, history of CVA, history of DVT left leg postoperatively, is a   esophageal stricture, GERD,  C. difficile infection, left foot pressure ulcer, severe osteopenia, falls   Admitted for AKI and dehydration, DM2  Met with pt in room today. Pt is a poor historian and is able to provide only limited history. Pt reports that she is in too much pain to eat today. Pt's lunch tray was sitting on her side table with only bites taken from it; pt reports eating some pudding for lunch today. Pt also had an unopened Ensure on her side table. Pt is documented to be eating <25% of meals in hospital since admit. Pt is receiving Magic Cups on her meal trays but did not touch the Magic Cup on her lunch tray. Spoke with MD about options for nutrition. Pt would not likely tolerate nasogastric tube and feeds as she has sundowning in the evenings and gets really agitated. Plan is to start Remeron tonight. RD will continue supplements and  vitamins; RD did try and encourage pt to drink the Ensure today and related it's importance to wound healing. RD will also add vitamin C to support wound healing.   No new weight since; RD attempted to weigh patient at bedside today but pt's bed does not have a scale.   Medications reviewed and include: allopurinol, insulin, remeron, MVI, NaCl @75ml /hr, unasyn   Labs reviewed: Na 147(H) BUN 61(H), creat 1.50(H), Mg 1.8 wnl Wbc- 10.9(H), Hgb 10.8(L), Hct 35.4(L) cbgs- 101, 114, 118 x 24 hrs  NUTRITION - FOCUSED PHYSICAL EXAM:  Flowsheet Row Most Recent Value  Orbital Region No depletion  Upper Arm Region Mild depletion  Thoracic and Lumbar Region No depletion  Buccal Region No depletion  Temple Region No depletion  Clavicle Bone Region Mild depletion  Clavicle and Acromion Bone Region Mild depletion  Scapular Bone Region No depletion  Dorsal Hand Mild depletion  Patellar Region Severe depletion  Anterior Thigh Region Severe depletion  Posterior Calf Region Severe depletion  Edema (RD Assessment) Mild  Hair Reviewed  Eyes Reviewed  Mouth Reviewed  Skin Reviewed  Nails Reviewed     Diet Order:   Diet Order            DIET - DYS 1 Room service appropriate? No; Fluid consistency: Thin  Diet effective now                EDUCATION NEEDS:   No education needs have been identified at this time  Skin:  Skin  Assessment: Skin Integrity Issues: Skin Integrity Issues:: Unstageable,Stage II Stage II: lumbar Unstageable: sacrum, lt heel  Last BM:  2/11  Height:   Ht Readings from Last 1 Encounters:  07/18/20 5' 5"  (1.651 m)    Weight:   Wt Readings from Last 1 Encounters:  07/18/20 98 kg    Ideal Body Weight:  56.8 kg  BMI:  Body mass index is 35.95 kg/m.  Estimated Nutritional Needs:   Kcal:  1800-2000  Protein:  100-115 grams  Fluid:  > 1.8 L  Koleen Distance MS, RD, LDN Please refer to [facility] for RD and/or RD on-call/weekend/after hours pager

## 2020-07-24 NOTE — Treatment Plan (Signed)
Pt has refused to eat breakfast and lunch. Spit out both meals.

## 2020-07-24 NOTE — Progress Notes (Signed)
ID Confused and agitated    Patient Vitals for the past 24 hrs:  BP Temp Temp src Pulse Resp SpO2  07/24/20 1205 (!) 174/85 97.8 F (36.6 C) Axillary 75 16 100 %  07/24/20 0516 139/71 98 F (36.7 C) Oral 85 16 -  07/24/20 0001 (!) 139/50 97.8 F (36.6 C) Oral 72 16 100 %  07/23/20 2043 (!) 145/49 97.8 F (36.6 C) Axillary 79 18 100 %  07/23/20 2015 - - - 67 16 100 %  07/23/20 1610 (!) 131/42 98.9 F (37.2 C) Oral - - -  sacral decubitus   Covered with wound vac   CBC Latest Ref Rng & Units 07/24/2020 07/23/2020 07/22/2020  WBC 4.0 - 10.5 K/uL 10.9(H) 6.2 10.3  Hemoglobin 12.0 - 15.0 g/dL 10.8(L) 10.5(L) 10.5(L)  Hematocrit 36.0 - 46.0 % 35.4(L) 35.6(L) 33.7(L)  Platelets 150 - 400 K/uL 320 270 278    CMP Latest Ref Rng & Units 07/24/2020 07/23/2020 07/22/2020  Glucose 70 - 99 mg/dL 121(H) 133(H) 107(H)  BUN 8 - 23 mg/dL 61(H) 63(H) 70(H)  Creatinine 0.44 - 1.00 mg/dL 1.50(H) 1.76(H) 1.82(H)  Sodium 135 - 145 mmol/L 147(H) 147(H) 148(H)  Potassium 3.5 - 5.1 mmol/L 3.8 4.2 3.5  Chloride 98 - 111 mmol/L 106 107 108  CO2 22 - 32 mmol/L 26 23 24   Calcium 8.9 - 10.3 mg/dL 10.1 9.8 10.0  Total Protein 6.5 - 8.1 g/dL - - -  Total Bilirubin 0.3 - 1.2 mg/dL - - -  Alkaline Phos 38 - 126 U/L - - -  AST 15 - 41 U/L - - -  ALT 0 - 44 U/L - - -    Wound culture- Ng so far  Impression/recommendation 87 year old female presenting from the nursing home with hypotension  Hypotension due to dehydration has resolved.  Acute metabolic encephalopathy/delirium still present  Stage III sacral decubitus.  It was an unstageable ulcer which got debrided on 07/22/2020.  Has a wound VAC.  Cultures so far negative.  On Unasyn.  Once we have the culture result we can decide on duration and route and need for antibiotic.  AKI due to dehydration.  Creatinine was 4.5 on admission now it is 1.50  Enterococcus in the urine is likely colonization of the Foley catheter  Bilateral basilar atelectasis.   No pneumonia  Recent L3 burst fracture needing surgery with hardware L2-L5 last year.  This was complicated by wound dehiscence and infection with E. coli and Proteus for which she was treated with 8 weeks of antibiotics.  The wound has healed.  Patient has declined over the past few months.  Palliative is on board.  ID will follow her peripherally this weekend.  Call if needed.

## 2020-07-24 NOTE — Consult Note (Signed)
[name]: Reason for Consult: First surgical dressing change and replacement of NPWT dressing.  Drs. Piscoya and Surgical PA-C, [name] are present and assisting with this procedure.   Persistent bleeding from two areas noted with dressing removal. [name] places 4 sutures to establish hemostasis. [name] photographs wound for record today. Patient is stooling and is noted to be impacted.  This Probation officer disimpacts a large, soft stool ball from the rectal vault.   Wound type:Pressure injury, Stage 4. Debridement on 07/22/20.  Pressure Injury POA: Yes Measurement: 7cm x 17cm x 1.5cm Wound bed: Central wound is bone with dark discoloration; this area comprises 30% of wound. 70% wound bed is red, dry.  Drainage (amount, consistency, odor) small amount of serosanguinous drainage in cannister, serosanguinous exudate in small amount from wound. Periwound: Blanching area of erythema measuring 5cm x 7cm from 8-9 o'clock from wound is noted.  This is not warm or indurated and it is not fluctuant. It is protected with two layers of drape today. Two areas of wounding in the periwound tissue are noted at 5 and 7 o'clock from wound. These are full thickness and are covered with nonviable yellow slough, each measures 1cm x 2cm.  The most distal aspect of the wound is less than 1.5cm from the anus.  Dressing procedure/placement/frequency: Dressing removed and wound cleansed with NS, persistent bleeding noted and [name] placed 4 sutures to establish hemostasis. Wound is again cleansed with NS, vulnerable areas are protected with drape. One skin barrier ring is torn and used to place a skin barrier strip along the most distal border of the wound to enhance the NPWT dressing seal. One (1) piece of black granufoam is used to obliterate the dead space. This is covered in drape. The dressing is bridges to the left hip over drape-protected skin. The bridge is sealed with drape and the dressing attached  to 154mmHg constant negative pressure.  An immediate seal is achieved.  Next dressing change is due on Monday, 07/27/20. Supplies are in the room for this change: 2 Medium VAC dressing kits and 2 skin barrier rings.   [place] nursing team will follow, and will remain available to this patient, the nursing, surgical and medical teams.   Thanks, Marie Flakes, MSN, RN, [name], [name]  Pager# [phone]

## 2020-07-25 DIAGNOSIS — N179 Acute kidney failure, unspecified: Secondary | ICD-10-CM | POA: Diagnosis not present

## 2020-07-25 DIAGNOSIS — E43 Unspecified severe protein-calorie malnutrition: Secondary | ICD-10-CM | POA: Diagnosis not present

## 2020-07-25 DIAGNOSIS — T148XXD Other injury of unspecified body region, subsequent encounter: Secondary | ICD-10-CM | POA: Diagnosis not present

## 2020-07-25 DIAGNOSIS — G9341 Metabolic encephalopathy: Secondary | ICD-10-CM | POA: Diagnosis not present

## 2020-07-25 LAB — BASIC METABOLIC PANEL
Anion gap: 9 (ref 5–15)
BUN: 50 mg/dL — ABNORMAL HIGH (ref 8–23)
CO2: 29 mmol/L (ref 22–32)
Calcium: 9.2 mg/dL (ref 8.9–10.3)
Chloride: 107 mmol/L (ref 98–111)
Creatinine, Ser: 1.18 mg/dL — ABNORMAL HIGH (ref 0.44–1.00)
GFR, Estimated: 44 mL/min — ABNORMAL LOW (ref >60)
Glucose, Bld: 112 mg/dL — ABNORMAL HIGH (ref 70–99)
Potassium: 3.1 mmol/L — ABNORMAL LOW (ref 3.5–5.1)
Sodium: 145 mmol/L (ref 135–145)

## 2020-07-25 LAB — CBC WITH DIFFERENTIAL/PLATELET
Abs Immature Granulocytes: 0.15 10*3/uL — ABNORMAL HIGH (ref 0.00–0.07)
Basophils Absolute: 0 10*3/uL (ref 0.0–0.1)
Basophils Relative: 0 %
Eosinophils Absolute: 0.1 10*3/uL (ref 0.0–0.5)
Eosinophils Relative: 1 %
HCT: 29.6 % — ABNORMAL LOW (ref 36.0–46.0)
Hemoglobin: 9 g/dL — ABNORMAL LOW (ref 12.0–15.0)
Immature Granulocytes: 2 %
Lymphocytes Relative: 16 %
Lymphs Abs: 1.5 10*3/uL (ref 0.7–4.0)
MCH: 27.2 pg (ref 26.0–34.0)
MCHC: 30.4 g/dL (ref 30.0–36.0)
MCV: 89.4 fL (ref 80.0–100.0)
Monocytes Absolute: 0.7 10*3/uL (ref 0.1–1.0)
Monocytes Relative: 8 %
Neutro Abs: 6.7 10*3/uL (ref 1.7–7.7)
Neutrophils Relative %: 73 %
Platelets: 283 10*3/uL (ref 150–400)
RBC: 3.31 MIL/uL — ABNORMAL LOW (ref 3.87–5.11)
RDW: 18.4 % — ABNORMAL HIGH (ref 11.5–15.5)
WBC: 9.2 10*3/uL (ref 4.0–10.5)
nRBC: 0.3 % — ABNORMAL HIGH (ref 0.0–0.2)

## 2020-07-25 LAB — GLUCOSE, CAPILLARY
Glucose-Capillary: 100 mg/dL — ABNORMAL HIGH (ref 70–99)
Glucose-Capillary: 107 mg/dL — ABNORMAL HIGH (ref 70–99)
Glucose-Capillary: 125 mg/dL — ABNORMAL HIGH (ref 70–99)
Glucose-Capillary: 81 mg/dL (ref 70–99)
Glucose-Capillary: 84 mg/dL (ref 70–99)
Glucose-Capillary: 87 mg/dL (ref 70–99)
Glucose-Capillary: 96 mg/dL (ref 70–99)

## 2020-07-25 LAB — MAGNESIUM: Magnesium: 1.5 mg/dL — ABNORMAL LOW (ref 1.7–2.4)

## 2020-07-25 MED ORDER — MAGNESIUM SULFATE 2 GM/50ML IV SOLN
2.0000 g | Freq: Once | INTRAVENOUS | Status: AC
  Administered 2020-07-25: 2 g via INTRAVENOUS
  Filled 2020-07-25: qty 50

## 2020-07-25 MED ORDER — MAGNESIUM SULFATE 2 GM/50ML IV SOLN: 2.0000 g | Freq: Once | INTRAVENOUS | Status: DC

## 2020-07-25 MED ORDER — POTASSIUM CHLORIDE 10 MEQ/100ML IV SOLN
10.0000 meq | INTRAVENOUS | Status: AC
  Administered 2020-07-25 (×4): 10 meq via INTRAVENOUS
  Filled 2020-07-25 (×4): qty 100

## 2020-07-25 MED ORDER — SODIUM CHLORIDE 0.9 % IV SOLN
3.0000 g | Freq: Four times a day (QID) | INTRAVENOUS | Status: DC
  Administered 2020-07-25 – 2020-07-26 (×5): 3 g via INTRAVENOUS
  Filled 2020-07-25: qty 8
  Filled 2020-07-25: qty 3
  Filled 2020-07-25 (×2): qty 8
  Filled 2020-07-25: qty 3
  Filled 2020-07-25 (×2): qty 8

## 2020-07-25 NOTE — Care Plan (Signed)
Pt pulling at lines, ripped mittens off, pulled bracelets off, and thew pillows on floor. Pulled at foley cath. Tele sitter ordered.

## 2020-07-25 NOTE — Progress Notes (Signed)
PROGRESS NOTE    [name]  [name] DOB: May 26, 1930 DOA: 07/18/2020 PCP: Biagio Borg, MD   Brief Narrative:  [name] Coveyis a 87 y.o.femalewith medical history significant of dementia, diastolic CHF s/p Lumbar fusion due to L3 frx and repair of traumatic CSF leakOct 2021,anemia, atrial flutter, breast cancer status post radiation therapy, CKD stage III, history of CVA, history of DVT left leg postoperatively, is a esophageal stricture,GERD, C. difficile infection,left foot pressure ulcer,severe osteopenia,falls.Presented withHypotension initial BP at facility down to 96/34 EMS gave 500 ml bolus and BP was 131/48 Per nursing staff patient have had decreased urine output. She has been somnolent/confused while here. She is found with AKI, Ucx+, and PNA . Ct head obtained +for acute intrav. Bleed, but trace. Neurosurgery was consulted.  2/6-confused, quiet.  2/7-sitting up in bed, still confused, mumbling something. (spoke to son, states since October she has been "mumbling" and not being her self. Prior to October (before surgery) pt apparently was active and sharp. 2/8-somnolent today. Does not open eyes for me. Per nsg does take meds . CT head obtained-see result 2/9: Patient more interactive.  Nonsensical speech.  Does not appear in any distress.  Patient n.p.o. for surgical debridement today.  Remains on IV antibiotics. 2/10: Postop day 1 status post wound debridement with general surgery.  Tolerated procedure well.  Wound VAC in place.  Seen by general surgery this morning. 2/11: Palliative care discussed with patient's son [name].  At this time still pursuing full scope of treatment including full CODE STATUS. 2/12: Plan of care discussed with patient's son [name] via phone.  Confirmed DNR status.  Order changed to computer and DNR form signed and left in paper chart.  Patient's mental status has been waxing and waning.  She seems okay in the morning however sundown's  rather markedly towards the afternoon and evening.  P.o. intake remains poor.   Assessment & Plan:   Active Problems:   Diabetes ([name])   Hyperlipidemia   Essential hypertension   Malignant neoplasm of upper-outer quadrant of right breast in female, estrogen receptor positive ([name])   Wound healing, delayed   AKI (acute kidney injury) ([name])   CKD (chronic kidney disease) stage 3, GFR 30-59 ml/min ([name])   Chronic diastolic CHF (congestive heart failure) ([name])   Protein-calorie malnutrition, severe ([name])   Dehydration   Hyperkalemia   CAP (community acquired pneumonia)   Acute metabolic encephalopathy   Sacral decubitus ulcer, stage IV ([name])  Acute metabolic encephalopathy Suspected hospital-acquired delirium  most likely multifactorial secondary to combination of infection ,dehydration, AKI, and acute intraventricular hemorrhage.  -Baseline mental status difficult to ascertain given underlying dementia -Per neurosurgery-no further inpatient intervention necessary Plan: Hypotonic fluids for now considering hypernatremia Monitor UOP and ensure no retention Continue Unasyn Frequent neuro checks Avoid sedatives Ensure pain control As needed atypical antipsychotics for agitation not related to  Protein-calorie malnutrition, severe  Long conversation with patient son to him on 07/24/2020.  Patient has been dealing with significantly poor p.o. intake worse over the past several months since a mechanical fall.  10 relayed that the patient needs ice cold water and likes putting a substantial amount of salt on her food.  From my end if we can improve the patient's oral intake by adding salt to food that is acceptable. -Also discussed with registered dietitian who [name] with the patient.  Given her level of malnutrition she would be a reasonable candidate for Dobbhoff and tube feeds however given  her poor mental status and frequent sundowning she will likely discontinue NG tube.  Also feel  she is a poor candidate for PEG tube as this carries the same risks of self discontinuation and aspiration. Plan: Continue to encourage p.o. intake is much as possible Add salt to foods Continue nightly Remeron, started 07/24/2020  Hypernatremia Improving Likely owing to poor p.o. fluid intake P.o. fluid intake slowly improving Plan: Continue half-normal saline for now We will attempt to discontinue within the next 24 hours if sodium improves  UTI +UA and ucx with Enter faeca.  Vancomycin-resistant Enterococcus isolated. ID consulted. Per ID Enterococcus is likely is contaminant Confirmed with RN, Foley was exchanged on admission including tubing and bag Plan: Continue Unasyn per infectious disease recommendations   Possible pneumonia, unlikely Per ID chest x-ray likely represents atelectasis White blood cell high level of normal Procalcitonin trending down Plan: On Unasyn for wound infection Oxygen as needed  Acute intraventricular hemorrhage-Trace .  Neurosurgery consulted Cleared for surgery No further intervention   Sacral decubitus ulcer Status post or debridement 07/22/2020 Tolerated debridement, wound VAC placed Surgery following for postoperative wound checks Plan:  Continue Unasyn  Continue wound VAC WOC consult Pain control as needed  Surgical follow-up    Hyperlipidemia -chronic stable not on statin  Essential hypertension hold home medications given hypotension on admission   Chronic diastolic CHF  currently appears to be in a dry side Hold lasix  AKI(acute kidney injury) ([place]) -appears to be prerenal-dehydration, on ACE and Lasix.   Hold lisinopril rehydrate and continue to follow RENAL US showing no evidence of hydronephrosis 2/11: Creatinine continues to improve.  1.5 today Continue to monitor   Dehydration-continue with IV fluid  DM2--controlled A1c 5.6 TSH 5.4, will need recheck in 4 weeks when acute phase illness  improves    DVT prophylaxis: SCD Code Status: Full Family Communication: [name] [phone] on 07/24/2020 Disposition Plan: Status is: Inpatient  Remains inpatient appropriate because:Inpatient level of care appropriate due to severity of illness   Dispo: The patient is from: Home              Anticipated d/c is to: SNF              Anticipated d/c date is: > 3 days              Patient currently is not medically stable to d/c.   Difficult to place patient No  Disposition plan pending.  Patient will likely need skilled nursing facility.  Postoperative day 2 status post surgical debridement of sacral decubitus ulcer.  Wound VAC in place.  General surgery following.     Level of care: Med-Surg  Consultants:   General surgery  Palliative care  Procedures:   Surgical debridement of sacral decubitus ulcer, 07/22/2020  Antimicrobials:   Unasyn   Subjective: Patient seen and examined.  More lethargic this morning.  In no visible distress.  Objective: Vitals:   07/24/20 2012 07/24/20 2343 07/25/20 0531 07/25/20 0723  BP:  (!) 139/47 (!) 145/61 (!) 165/49  Pulse:  65 69 99  Resp:  20 20 16   Temp:  97.9 F (36.6 C) (!) 97.4 F (36.3 C) (!) 97.5 F (36.4 C)  TempSrc:   Oral Axillary  SpO2: 98% 99% 100% 95%  Weight:      Height:        Intake/Output Summary (Last 24 hours) at 07/25/2020 1021 Last data filed at 07/25/2020 0714 Gross per 24 hour  Intake 210 ml  Output 750 ml  Net -540 ml   Filed Weights   07/18/20 1603  Weight: 98 kg    Examination:  General exam: No acute distress.  Confused.  Frail Respiratory system: Bibasilar crackles.  Normal work of breathing.  Room air Cardiovascular system: S1-S2, regular rate and rhythm,+murmur Gastrointestinal system: Soft, nontender, nondistended, normal bowel sounds Central nervous system: Alert, confused, oriented x1, no focal deficits Extremities: Symmetric 5 x 5 power. Skin: Sacral decubitus status  post debridement.  Wound VAC in place Psychiatry: Judgement and insight appear impaired. Mood & affect flattened.     Data Reviewed: I have personally reviewed following labs and imaging studies  CBC: Recent Labs  Lab 07/19/20 0311 07/22/20 0751 07/23/20 0402 07/24/20 0707 07/25/20 0425  WBC 9.1 10.3 6.2 10.9* 9.2  NEUTROABS 7.1 7.7 5.3 8.9* 6.7  HGB 10.0* 10.5* 10.5* 10.8* 9.0*  HCT 34.0* 33.7* 35.6* 35.4* 29.6*  MCV 92.4 89.6 90.4 88.5 89.4  PLT 310 278 270 320 413   Basic Metabolic Panel: Recent Labs  Lab 07/19/20 0311 07/20/20 0424 07/21/20 0421 07/22/20 0751 07/23/20 0402 07/24/20 0707 07/25/20 0425  NA 139   < > 147* 148* 147* 147* 145  K 5.4*   < > 3.9 3.5 4.2 3.8 3.1*  CL 102   < > 110 108 107 106 107  CO2 25   < > 24 24 23 26 29   GLUCOSE 102*   < > 89 107* 133* 121* 112*  BUN 128*   < > 95* 70* 63* 61* 50*  CREATININE 3.93*   < > 2.44* 1.82* 1.76* 1.50* 1.18*  CALCIUM 10.8*   < > 9.6 10.0 9.8 10.1 9.2  MG 2.6*  --   --  1.8 1.8 1.8 1.5*  PHOS 6.4*  --   --   --   --   --   --    < > = values in this interval not displayed.   GFR: Estimated Creatinine Clearance: 38.2 mL/min (A) (by C-G formula based on SCr of 1.18 mg/dL (H)). Liver Function Tests: Recent Labs  Lab 07/18/20 1554 07/19/20 0311  AST 24 28  ALT 14 15  ALKPHOS 83 85  BILITOT 0.7 0.8  PROT 7.0 6.9  ALBUMIN 2.4* 2.4*   No results for input(s): LIPASE, AMYLASE in the last 168 hours. No results for input(s): AMMONIA in the last 168 hours. Coagulation Profile: No results for input(s): INR, PROTIME in the last 168 hours. Cardiac Enzymes: Recent Labs  Lab 07/18/20 2342 07/19/20 0311  CKTOTAL 373* 460*   BNP (last 3 results) No results for input(s): PROBNP in the last 8760 hours. HbA1C: No results for input(s): HGBA1C in the last 72 hours. CBG: Recent Labs  Lab 07/24/20 1602 07/24/20 2051 07/25/20 0043 07/25/20 0656 07/25/20 0749  GLUCAP 103* 92 125* 81 107*   Lipid  Profile: No results for input(s): CHOL, HDL, LDLCALC, TRIG, CHOLHDL, LDLDIRECT in the last 72 hours. Thyroid Function Tests: No results for input(s): TSH, T4TOTAL, FREET4, T3FREE, THYROIDAB in the last 72 hours. Anemia Panel: No results for input(s): VITAMINB12, FOLATE, FERRITIN, TIBC, IRON, RETICCTPCT in the last 72 hours. Sepsis Labs: Recent Labs  Lab 07/18/20 1554 07/18/20 2105 07/19/20 0311 07/20/20 0424  PROCALCITON 0.51  --  0.46 0.38  LATICACIDVEN 1.0 0.9  --   --     Recent Results (from the past 240 hour(s))  Blood Culture (routine x 2)     Status: None  Collection Time: 07/18/20  4:30 PM   Specimen: BLOOD  Result Value Ref Range Status   Specimen Description BLOOD BLOOD RIGHT FOREARM  Final   Special Requests   Final    BOTTLES DRAWN AEROBIC AND ANAEROBIC Blood Culture adequate volume   Culture   Final    NO GROWTH 5 DAYS Performed at [facility], [address], [place], [place] [address]    Report Status 07/23/2020 FINAL  Final  SARS Coronavirus 2 by RT PCR (hospital order, performed in [facility] hospital lab) Nasopharyngeal Nasopharyngeal Swab     Status: None   Collection Time: 07/18/20  5:39 PM   Specimen: Nasopharyngeal Swab  Result Value Ref Range Status   SARS Coronavirus 2 NEGATIVE NEGATIVE Final    Comment: (NOTE) SARS-CoV-2 target nucleic acids are NOT DETECTED.  The SARS-CoV-2 RNA is generally detectable in upper and lower respiratory specimens during the acute phase of infection. The lowest concentration of SARS-CoV-2 viral copies this assay can detect is 250 copies / mL. A negative result does not preclude SARS-CoV-2 infection and should not be used as the sole basis for treatment or other patient management decisions.  A negative result may occur with improper specimen collection / handling, submission of specimen other than nasopharyngeal swab, presence of viral mutation(s) within the areas targeted by this assay, and inadequate  number of viral copies (<250 copies / mL). A negative result must be combined with clinical observations, patient history, and epidemiological information.  Fact Sheet for Patients:   StrictlyIdeas.no  Fact Sheet for Healthcare Providers: BankingDealers.co.za  This test is not yet approved or  cleared by the [place] FDA and has been authorized for detection and/or diagnosis of SARS-CoV-2 by FDA under an Emergency Use Authorization (EUA).  This EUA will remain in effect (meaning this test can be used) for the duration of the COVID-19 declaration under Section 564(b)(1) of the Act, 21 U.S.C. section 360bbb-3(b)(1), unless the authorization is terminated or revoked sooner.  Performed at [facility], [address] [place], [place] [address]   Blood Culture (routine x 2)     Status: None   Collection Time: 07/18/20  6:40 PM   Specimen: BLOOD  Result Value Ref Range Status   Specimen Description BLOOD RIGHT ANTECUBITAL  Final   Special Requests   Final    BOTTLES DRAWN AEROBIC AND ANAEROBIC Blood Culture adequate volume   Culture   Final    NO GROWTH 5 DAYS Performed at [facility], [address] [place], [place] [address]    Report Status 07/23/2020 FINAL  Final  Urine culture     Status: Abnormal   Collection Time: 07/18/20  8:26 PM   Specimen: Urine, Random  Result Value Ref Range Status   Specimen Description   Final    URINE, RANDOM Performed at [facility], [address] [place], [place] [address]    Special Requests   Final    NONE Performed at [facility], [address] [place], [place] [address]    Culture (A)  Final    >=100,000 COLONIES/mL ENTEROCOCCUS FAECALIS VANCOMYCIN RESISTANT ENTEROCOCCUS ISOLATED    Report Status 07/21/2020 FINAL  Final   Organism ID, Bacteria ENTEROCOCCUS FAECALIS (A)  Final      Susceptibility   Enterococcus faecalis - MIC*     AMPICILLIN <=2 SENSITIVE Sensitive     NITROFURANTOIN <=16 SENSITIVE Sensitive     VANCOMYCIN >=32 RESISTANT Resistant  LINEZOLID 2 SENSITIVE Sensitive     * >=100,000 COLONIES/mL ENTEROCOCCUS FAECALIS  MRSA PCR Screening     Status: Abnormal   Collection Time: 07/19/20  3:10 AM   Specimen: Nasal Mucosa; Nasopharyngeal  Result Value Ref Range Status   MRSA by PCR POSITIVE (A) NEGATIVE Final    Comment:        The GeneXpert MRSA Assay (FDA approved for NASAL specimens only), is one component of a comprehensive MRSA colonization surveillance program. It is not intended to diagnose MRSA infection nor to guide or monitor treatment for MRSA infections. RESULT CALLED TO, READ BACK BY AND VERIFIED WITH: STACY CLAY AT 0609 ON 07/19/2020 [place]. Performed at [facility], [address] [place], [place] [address]   Aerobic/Anaerobic Culture (surgical/deep wound)     Status: None (Preliminary result)   Collection Time: 07/22/20  3:00 PM   Specimen: PATH Other; Tissue  Result Value Ref Range Status   Specimen Description   Final    WOUND Performed at [facility], [address] [place], [place] [address]    Special Requests BONE COCCYX  Final   Gram Stain NO WBC SEEN NO ORGANISMS SEEN   Final   Culture   Final    NO GROWTH 1 DAY Performed at [facility], [place] [address] [place], [place] [address]    Report Status PENDING  Incomplete  Aerobic/Anaerobic Culture (surgical/deep wound)     Status: None (Preliminary result)   Collection Time: 07/22/20  3:04 PM   Specimen: PATH Other; Tissue  Result Value Ref Range Status   Specimen Description   Final    WOUND Performed at [facility], [address] [place], [place] [address]    Special Requests FECAL DECUBITIS  Final   Gram Stain NO WBC SEEN NO ORGANISMS SEEN   Final   Culture   Final    RARE STAPHYLOCOCCUS AUREUS RARE ENTEROCOCCUS FAECALIS CULTURE REINCUBATED FOR BETTER  GROWTH Performed at [facility], [place] [address] [place], [place] [address]    Report Status PENDING  Incomplete         Radiology Studies: No results found.      Scheduled Meds: . allopurinol  100 mg Oral Daily  . vitamin C  250 mg Oral BID  . Chlorhexidine Gluconate Cloth  6 each Topical Daily  . feeding supplement  237 mL Oral TID BM  . insulin aspart  0-6 Units Subcutaneous Q4H  . mirtazapine  15 mg Oral QHS  . multivitamin with minerals  1 tablet Oral Daily   Continuous Infusions: . sodium chloride 75 mL/hr at 07/25/20 0542  . ampicillin-sulbactam (UNASYN) IV    . magnesium sulfate bolus IVPB    . potassium chloride 10 mEq (07/25/20 0954)     LOS: 7 days    Time spent: 25 minutes    Sidney Ace, MD [facility] Pager [phone]  If 7PM-7AM, please contact night-coverage 07/25/2020, 10:21 AM

## 2020-07-25 NOTE — Progress Notes (Signed)
  Speech Language Pathology Treatment: Dysphagia  Patient Details Name: [name] MRN: [id number] DOB: 1929-11-27 Today's Date: 07/25/2020 Time: 2641-5830 SLP Time Calculation (min) (ACUTE ONLY): 16 min  Assessment / Plan / Recommendation Clinical Impression  Despite extensive cues, cognitive redirection and trials of saltine crackers, pt refused all PO intake d/t severe deficits in mentation. Pt was not able to be redirected even when mitten was removed and this writer attempted to have her hold the cracker or cup. At this time, pt's poor PO intake appears related to severity of cognitive decline not related to texture of POs. Education provided to pt's nurse and MD. Recommend pt remain on dysphagia 1 diet with thin liquids, medicine crushed in puree. Continued concern that pt's AMS will prevent her from maintaining nutrition and hydration.   ST intervention is not indicated at this time.     HPI HPI: 87 y.o. female with medical history significant of dementia, diastolic CHF   s/p Lumbar fusion due to L3 frx and repair of traumatic CSF leak Oct 2021, anemia, atrial flutter, breast cancer status post radiation therapy, CKD stage III, history of CVA, history of DVT left leg postoperatively, esophageal stricture, GERD,  C. difficile infection, left foot pressure ulcer, severe osteopenia, falls who presented with hypotension. CXR showed possible PNA.      SLP Plan  Discharge SLP treatment due to (comment) (AMS prevents consumption of POs)       Recommendations  Diet recommendations: Dysphagia 1 (puree);Thin liquid Liquids provided via: Straw Medication Administration: Crushed with puree Supervision: Full supervision/cueing for compensatory strategies;Trained caregiver to feed patient Compensations: Minimize environmental distractions;Slow rate;Small sips/bites;Monitor for anterior loss;Other (Comment) Postural Changes and/or Swallow Maneuvers: Seated upright 90 degrees;Upright 30-60 min  after meal                Oral Care Recommendations: Oral care BID Follow up Recommendations: Skilled Nursing facility SLP Visit Diagnosis: Dysphagia, oral phase (R13.11) Plan: Discharge SLP treatment due to (comment) (AMS prevents consumption of POs)       GO               [name] Nail M.S., CCC-SLP, [place] Office [phone]  [name] 07/25/2020, 1:39 PM

## 2020-07-25 NOTE — Progress Notes (Signed)
PHARMACY NOTE:  ANTIMICROBIAL RENAL DOSAGE ADJUSTMENT  Current antimicrobial regimen includes a mismatch between antimicrobial dosage and estimated renal function.  As per policy approved by the Pharmacy & Therapeutics and Medical Executive Committees, the antimicrobial dosage will be adjusted accordingly.  Current antimicrobial dosage:  Unasyn 3 grams IV every 12 hours  Indication: cellulitis  Renal Function:  Estimated Creatinine Clearance: 38.2 mL/min (A) (by C-G formula based on SCr of 1.18 mg/dL (H)). []      On intermittent HD, scheduled: []      On CRRT    Antimicrobial dosage has been changed to:  Unasyn 3 grams IV every 6 hours  Thank you for allowing pharmacy to be a part of this patient's care.  Dallie Piles, [facility] 07/25/2020 7:23 AM

## 2020-07-25 NOTE — Care Plan (Signed)
Pt agitated and pushing writers hand away when trying to feed her and give her fluids. She keeps stating why are you doing this to me. Myself, nursing student and NA tried. Unable to succeed or redirect.

## 2020-07-26 DIAGNOSIS — N179 Acute kidney failure, unspecified: Secondary | ICD-10-CM | POA: Diagnosis not present

## 2020-07-26 DIAGNOSIS — E43 Unspecified severe protein-calorie malnutrition: Secondary | ICD-10-CM | POA: Diagnosis not present

## 2020-07-26 DIAGNOSIS — T148XXD Other injury of unspecified body region, subsequent encounter: Secondary | ICD-10-CM | POA: Diagnosis not present

## 2020-07-26 DIAGNOSIS — G9341 Metabolic encephalopathy: Secondary | ICD-10-CM | POA: Diagnosis not present

## 2020-07-26 LAB — BASIC METABOLIC PANEL
Anion gap: 13 (ref 5–15)
BUN: 41 mg/dL — ABNORMAL HIGH (ref 8–23)
CO2: 26 mmol/L (ref 22–32)
Calcium: 9.8 mg/dL (ref 8.9–10.3)
Chloride: 104 mmol/L (ref 98–111)
Creatinine, Ser: 1.18 mg/dL — ABNORMAL HIGH (ref 0.44–1.00)
GFR, Estimated: 44 mL/min — ABNORMAL LOW (ref >60)
Glucose, Bld: 96 mg/dL (ref 70–99)
Potassium: 3.8 mmol/L (ref 3.5–5.1)
Sodium: 143 mmol/L (ref 135–145)

## 2020-07-26 LAB — GLUCOSE, CAPILLARY
Glucose-Capillary: 85 mg/dL (ref 70–99)
Glucose-Capillary: 89 mg/dL (ref 70–99)
Glucose-Capillary: 138 mg/dL — ABNORMAL HIGH (ref 70–99)
Glucose-Capillary: 108 mg/dL — ABNORMAL HIGH (ref 70–99)

## 2020-07-26 LAB — CBC WITH DIFFERENTIAL/PLATELET
Abs Immature Granulocytes: 0.23 10*3/uL — ABNORMAL HIGH (ref 0.00–0.07)
Basophils Absolute: 0 10*3/uL (ref 0.0–0.1)
Basophils Relative: 0 %
Eosinophils Absolute: 0.4 10*3/uL (ref 0.0–0.5)
Eosinophils Relative: 3 %
HCT: 36.1 % (ref 36.0–46.0)
Hemoglobin: 11 g/dL — ABNORMAL LOW (ref 12.0–15.0)
Immature Granulocytes: 2 %
Lymphocytes Relative: 26 %
Lymphs Abs: 3.1 10*3/uL (ref 0.7–4.0)
MCH: 27 pg (ref 26.0–34.0)
MCHC: 30.5 g/dL (ref 30.0–36.0)
MCV: 88.5 fL (ref 80.0–100.0)
Monocytes Absolute: 0.9 10*3/uL (ref 0.1–1.0)
Monocytes Relative: 8 %
Neutro Abs: 7.3 10*3/uL (ref 1.7–7.7)
Neutrophils Relative %: 61 %
Platelets: 374 10*3/uL (ref 150–400)
RBC: 4.08 MIL/uL (ref 3.87–5.11)
RDW: 18.4 % — ABNORMAL HIGH (ref 11.5–15.5)
WBC: 12 10*3/uL — ABNORMAL HIGH (ref 4.0–10.5)
nRBC: 0.2 % (ref 0.0–0.2)

## 2020-07-26 LAB — MAGNESIUM: Magnesium: 2.2 mg/dL (ref 1.7–2.4)

## 2020-07-26 MED ORDER — VANCOMYCIN HCL 2000 MG/400ML IV SOLN
2000.0000 mg | Freq: Once | INTRAVENOUS | Status: AC
  Administered 2020-07-26: 2000 mg via INTRAVENOUS
  Filled 2020-07-26: qty 400

## 2020-07-26 MED ORDER — SODIUM CHLORIDE 0.9 % IV SOLN
3.0000 g | Freq: Four times a day (QID) | INTRAVENOUS | Status: DC
  Administered 2020-07-26 – 2020-08-05 (×38): 3 g via INTRAVENOUS
  Filled 2020-07-26 (×2): qty 8
  Filled 2020-07-26: qty 3
  Filled 2020-07-26: qty 8
  Filled 2020-07-26: qty 3
  Filled 2020-07-26 (×6): qty 8
  Filled 2020-07-26: qty 3
  Filled 2020-07-26: qty 8
  Filled 2020-07-26: qty 3
  Filled 2020-07-26: qty 8
  Filled 2020-07-26 (×3): qty 3
  Filled 2020-07-26 (×4): qty 8
  Filled 2020-07-26: qty 3
  Filled 2020-07-26 (×3): qty 8
  Filled 2020-07-26 (×6): qty 3
  Filled 2020-07-26: qty 8
  Filled 2020-07-26 (×2): qty 3
  Filled 2020-07-26: qty 8
  Filled 2020-07-26 (×2): qty 3
  Filled 2020-07-26: qty 8
  Filled 2020-07-26 (×2): qty 3
  Filled 2020-07-26: qty 8

## 2020-07-26 MED ORDER — VANCOMYCIN HCL 750 MG/150ML IV SOLN
750.0000 mg | INTRAVENOUS | Status: DC
  Administered 2020-07-27 – 2020-07-30 (×4): 750 mg via INTRAVENOUS
  Filled 2020-07-26 (×4): qty 150

## 2020-07-26 MED ORDER — CLINDAMYCIN PHOSPHATE 600 MG/50ML IV SOLN
600.0000 mg | Freq: Three times a day (TID) | INTRAVENOUS | Status: DC
  Filled 2020-07-26 (×4): qty 50

## 2020-07-26 NOTE — Progress Notes (Addendum)
Pharmacy Antibiotic Note  [name] is a 87 y.o. female with medical history significant of dementia, diastolic CHF s/p lumbar fusion due to L3 frx and repair of traumatic CSF leak Oct 2021, anemia, atrial flutter, breast cancer s/p radiation therapy, CKD stage III, CVA, history of DVT left leg postoperatively, esophageal stricture, GERD,  C. difficile infection, left foot pressure ulcer, severe osteopenia, falls with a Stage III sacral decubitus ulcer with a wound VAC. Pharmacy has been consulted for vancomycin dosing after her wound grew out MRSA and VRE today.  Plan: start vancomycin 2000 mg IV x 1 then 750 mg IV every 24 hours  Goal AUC 400-550  Expected AUC: 527.6  SCr used: 1.18  Ke: 0.029 h-1, T1/2: 23.9h  Daily SCr while on vancomycin  Levels as clinically indicated  Height: 5\' 5"  (165.1 cm) Weight: 98 kg (216 lb 0.8 oz) IBW/kg (Calculated) : 57  Temp (24hrs), Avg:97.9 F (36.6 C), Min:97.8 F (36.6 C), Max:98 F (36.7 C)  Recent Labs  Lab 07/22/20 0751 07/23/20 0402 07/24/20 0707 07/25/20 0425 07/26/20 0438  WBC 10.3 6.2 10.9* 9.2 12.0*  CREATININE 1.82* 1.76* 1.50* 1.18* 1.18*    Estimated Creatinine Clearance: 38.2 mL/min (A) (by C-G formula based on SCr of 1.18 mg/dL (H)).    Allergies  Allergen Reactions  . Ace Inhibitors Other (See Comments)    Tolerates lisinopril at home. Pt does not recall allergy.  . Atorvastatin     REACTION: myalgias  . Oxycodone-Acetaminophen     REACTION: confusion, fatigue  . Rosuvastatin     REACTION: leg weakness  . Simvastatin     REACTION: leg weakness  . Codeine Nausea And Vomiting and Rash  . Sulfonamide Derivatives Hives and Rash    Antimicrobials this admission: ceftriaxone 2/6 >> 2/8 azithromycin 2/6 >> 2/8 Unasyn 2/8 >>  vancomycin 2/13 >>   Microbiology results: 2/5 BCx: NG final 2/5 UCx: VRE  2/9 WCx: MRSA, VRE (anaerobe pending) 2/9 WCx: NG x 3 days 2/6 MRSA PCR: positive 2/5 SARS CoV-2:  negative  Thank you for allowing pharmacy to be a part of this patient's care.  Dallie Piles 07/26/2020 2:44 PM

## 2020-07-26 NOTE — Progress Notes (Signed)
PROGRESS NOTE    TAMERRA [name]  [name]:[id number] DOB: 07-Jan-1930 DOA: 07/18/2020 PCP: Biagio Borg, MD   Brief Narrative:  [name] Coveyis a 87 y.o.femalewith medical history significant of dementia, diastolic CHF s/p Lumbar fusion due to L3 frx and repair of traumatic CSF leakOct 2021,anemia, atrial flutter, breast cancer status post radiation therapy, CKD stage III, history of CVA, history of DVT left leg postoperatively, is a esophageal stricture,GERD, C. difficile infection,left foot pressure ulcer,severe osteopenia,falls.Presented withHypotension initial BP at facility down to 96/34 EMS gave 500 ml bolus and BP was 131/48 Per nursing staff patient have had decreased urine output. She has been somnolent/confused while here. She is found with AKI, Ucx+, and PNA . Ct head obtained +for acute intrav. Bleed, but trace. Neurosurgery was consulted.  2/6-confused, quiet.  2/7-sitting up in bed, still confused, mumbling something. (spoke to son, states since October she has been "mumbling" and not being her self. Prior to October (before surgery) pt apparently was active and sharp. 2/8-somnolent today. Does not open eyes for me. Per nsg does take meds . CT head obtained-see result 2/9: Patient more interactive.  Nonsensical speech.  Does not appear in any distress.  Patient n.p.o. for surgical debridement today.  Remains on IV antibiotics. 2/10: Postop day 1 status post wound debridement with general surgery.  Tolerated procedure well.  Wound VAC in place.  Seen by general surgery this morning. 2/11: Palliative care discussed with patient's son [name].  At this time still pursuing full scope of treatment including full CODE STATUS. 2/12: Plan of care discussed with patient's son [name] via phone.  Confirmed DNR status.  Order changed to computer and DNR form signed and left in paper chart.  Patient's mental status has been waxing and waning.  She seems okay in the morning however sundown's  rather markedly towards the afternoon and evening.  P.o. intake remains poor. 2/13: Patient's mental status is remained poor despite aggressive efforts.  Frequent reorienting measures, frequent reassurance, frequent attempts to feed the patient, added salt to the meals, ice water.  I have discussed the case with patient's bedside RN, registered dietitian, speech-language pathologist.   Assessment & Plan:   Active Problems:   Diabetes ([place])   Hyperlipidemia   Essential hypertension   Malignant neoplasm of upper-outer quadrant of right breast in female, estrogen receptor positive ([place])   Wound healing, delayed   AKI (acute kidney injury) ([place])   CKD (chronic kidney disease) stage 3, GFR 30-59 ml/min ([name])   Chronic diastolic CHF (congestive heart failure) ([name])   Protein-calorie malnutrition, severe ([name])   Dehydration   Hyperkalemia   CAP (community acquired pneumonia)   Acute metabolic encephalopathy   Sacral decubitus ulcer, stage IV ([name])  Acute metabolic encephalopathy Suspected hospital-acquired delirium  most likely multifactorial secondary to combination of infection ,dehydration, AKI, and acute intraventricular hemorrhage.  -Baseline mental status difficult to ascertain given underlying dementia -Per neurosurgery-no further inpatient intervention necessary Plan: Continue half-normal saline at 50 cc/h Monitor UOP and ensure no retention Continue Unasyn Frequent neuro checks Avoid sedatives Ensure pain control As needed atypical antipsychotics for agitation not related to pain  Protein-calorie malnutrition, severe  Long conversation with patient son to him on 07/24/2020.  Patient has been dealing with significantly poor p.o. intake worse over the past several months since a mechanical fall.  10 relayed that the patient needs ice cold water and likes putting a substantial amount of salt on her food.  From my end if  we can improve the patient's oral intake by adding salt to  food that is acceptable. -Also discussed with registered dietitian who is familiar with the patient.  Given her level of malnutrition she would be a reasonable candidate for Dobb[place] and tube feeds however given her poor mental status and frequent sundowning she will likely discontinue NG tube.  Also feel she is a poor candidate for PEG tube as this carries the same risks of self discontinuation and aspiration. -Mental status has remained poor and repeated efforts to feed the patient, including food she likes by nursing staff have been overall unsuccessful.  Patient frequently refuses to eat and his hand away.  At this time I feel her mental status is far to labile and poor to justify NG tube placement as she will likely pull tube out. Plan: Continue to encourage p.o. intake is much as possible Add salt to foods Continue nightly Remeron, started 07/24/2020.  Likely want to see effect for several weeks  Hypernatremia Improving Likely owing to poor p.o. fluid intake P.o. fluid intake slowly improving Plan: Continue half-normal saline for now, rate decreased to 50 We will attempt to discontinue within the next 24 hours if sodium improves  UTI +UA and ucx with Enter faeca.  Vancomycin-resistant Enterococcus isolated. ID consulted. Per ID Enterococcus is likely is contaminant Confirmed with RN, Foley was exchanged on admission including tubing and bag Plan: Continue Unasyn per infectious disease recommendations   Possible pneumonia, unlikely Per ID chest x-ray likely represents atelectasis White blood cell high level of normal Procalcitonin trending down Plan: On Unasyn for wound infection Oxygen as needed  Acute intraventricular hemorrhage-Trace .  Neurosurgery consulted Cleared for surgery No further intervention   Sacral decubitus ulcer Status post or debridement 07/22/2020 Tolerated debridement, wound VAC placed Surgery following for postoperative wound checks Plan:   Continue Unasyn  Continue wound VAC WOC consult Pain control as needed  Surgical follow-up    Hyperlipidemia -chronic stable not on statin  Essential hypertension hold home medications given hypotension on admission   Chronic diastolic CHF  currently appears to be in a dry side Hold lasix  AKI(acute kidney injury) ([place]) -appears to be prerenal-dehydration, on ACE and Lasix.   Hold lisinopril rehydrate and continue to follow RENAL US showing no evidence of hydronephrosis 2/13: Creatinine continues to improve.  1.18 today Continue to monitor   Dehydration-continue with IV fluid  DM2--controlled A1c 5.6 TSH 5.4, will need recheck in 4 weeks when acute phase illness improves    DVT prophylaxis: SCD Code Status: Full Family Communication: Terianne Thaker [phone] on 07/26/2020 Disposition Plan: Status is: Inpatient  Remains inpatient appropriate because:Inpatient level of care appropriate due to severity of illness   Dispo: The patient is from: Home              Anticipated d/c is to: SNF              Anticipated d/c date is: > 3 days              Patient currently is not medically stable to d/c.   Difficult to place patient No  Disposition plan unclear at this time.  Will likely need skilled nursing facility.  Wound VAC remains in place.  Postoperative day #4.    Level of care: Med-Surg  Consultants:   General surgery  Palliative care  Procedures:   Surgical debridement of sacral decubitus ulcer, 07/22/2020  Antimicrobials:   Unasyn   Subjective: Patient seen and  examined.  Confused this morning but overall calm. Objective: Vitals:   07/25/20 0723 07/25/20 2009 07/25/20 2352 07/26/20 0432  BP: (!) 165/49 (!) 152/98 (!) 183/63 (!) 151/137  Pulse: 99 95 71 (!) 105  Resp: 16 19 18 18   Temp: (!) 97.5 F (36.4 C)  97.8 F (36.6 C) 98 F (36.7 C)  TempSrc: Axillary     SpO2: 95% 90% 97% 100%  Weight:      Height:         Intake/Output Summary (Last 24 hours) at 07/26/2020 1224 Last data filed at 07/26/2020 0900 Gross per 24 hour  Intake 3109.78 ml  Output 1800 ml  Net 1309.78 ml   Filed Weights   07/18/20 1603  Weight: 98 kg    Examination:  General exam: No acute distress.  Confused.  Frail Respiratory system: Bibasilar crackles.  Normal work of breathing.  Room air Cardiovascular system: S1-S2, regular rate and rhythm,+murmur Gastrointestinal system: Soft, nontender, nondistended, normal bowel sounds Central nervous system: Alert, confused, oriented x1, no focal deficits Extremities: Symmetric 5 x 5 power. Skin: Sacral decubitus status post debridement.  Wound VAC in place Psychiatry: Judgement and insight appear impaired. Mood & affect flattened.     Data Reviewed: I have personally reviewed following labs and imaging studies  CBC: Recent Labs  Lab 07/22/20 0751 07/23/20 0402 07/24/20 0707 07/25/20 0425 07/26/20 0438  WBC 10.3 6.2 10.9* 9.2 12.0*  NEUTROABS 7.7 5.3 8.9* 6.7 7.3  HGB 10.5* 10.5* 10.8* 9.0* 11.0*  HCT 33.7* 35.6* 35.4* 29.6* 36.1  MCV 89.6 90.4 88.5 89.4 88.5  PLT 278 270 320 283 408   Basic Metabolic Panel: Recent Labs  Lab 07/22/20 0751 07/23/20 0402 07/24/20 0707 07/25/20 0425 07/26/20 0438  NA 148* 147* 147* 145 143  K 3.5 4.2 3.8 3.1* 3.8  CL 108 107 106 107 104  CO2 24 23 26 29 26   GLUCOSE 107* 133* 121* 112* 96  BUN 70* 63* 61* 50* 41*  CREATININE 1.82* 1.76* 1.50* 1.18* 1.18*  CALCIUM 10.0 9.8 10.1 9.2 9.8  MG 1.8 1.8 1.8 1.5* 2.2   GFR: Estimated Creatinine Clearance: 38.2 mL/min (A) (by C-G formula based on SCr of 1.18 mg/dL (H)). Liver Function Tests: No results for input(s): AST, ALT, ALKPHOS, BILITOT, PROT, ALBUMIN in the last 168 hours. No results for input(s): LIPASE, AMYLASE in the last 168 hours. No results for input(s): AMMONIA in the last 168 hours. Coagulation Profile: No results for input(s): INR, PROTIME in the last 168  hours. Cardiac Enzymes: No results for input(s): CKTOTAL, CKMB, CKMBINDEX, TROPONINI in the last 168 hours. BNP (last 3 results) No results for input(s): PROBNP in the last 8760 hours. HbA1C: No results for input(s): HGBA1C in the last 72 hours. CBG: Recent Labs  Lab 07/25/20 1603 07/25/20 2010 07/25/20 2349 07/26/20 0458 07/26/20 0755  GLUCAP 84 100* 87 85 89   Lipid Profile: No results for input(s): CHOL, HDL, LDLCALC, TRIG, CHOLHDL, LDLDIRECT in the last 72 hours. Thyroid Function Tests: No results for input(s): TSH, T4TOTAL, FREET4, T3FREE, THYROIDAB in the last 72 hours. Anemia Panel: No results for input(s): VITAMINB12, FOLATE, FERRITIN, TIBC, IRON, RETICCTPCT in the last 72 hours. Sepsis Labs: Recent Labs  Lab 07/20/20 0424  PROCALCITON 0.38    Recent Results (from the past 240 hour(s))  Blood Culture (routine x 2)     Status: None   Collection Time: 07/18/20  4:30 PM   Specimen: BLOOD  Result Value Ref Range Status  Specimen Description BLOOD BLOOD RIGHT FOREARM  Final   Special Requests   Final    BOTTLES DRAWN AEROBIC AND ANAEROBIC Blood Culture adequate volume   Culture   Final    NO GROWTH 5 DAYS Performed at [facility], [address] [place], [place] [address]    Report Status 07/23/2020 FINAL  Final  SARS Coronavirus 2 by RT PCR (hospital order, performed in [facility] hospital lab) Nasopharyngeal Nasopharyngeal Swab     Status: None   Collection Time: 07/18/20  5:39 PM   Specimen: Nasopharyngeal Swab  Result Value Ref Range Status   SARS Coronavirus 2 NEGATIVE NEGATIVE Final    Comment: (NOTE) SARS-CoV-2 target nucleic acids are NOT DETECTED.  The SARS-CoV-2 RNA is generally detectable in upper and lower respiratory specimens during the acute phase of infection. The lowest concentration of SARS-CoV-2 viral copies this assay can detect is 250 copies / mL. A negative result does not preclude SARS-CoV-2 infection and should not be  used as the sole basis for treatment or other patient management decisions.  A negative result may occur with improper specimen collection / handling, submission of specimen other than nasopharyngeal swab, presence of viral mutation(s) within the areas targeted by this assay, and inadequate number of viral copies (<250 copies / mL). A negative result must be combined with clinical observations, patient history, and epidemiological information.  Fact Sheet for Patients:   StrictlyIdeas.no  Fact Sheet for Healthcare Providers: BankingDealers.co.za  This test is not yet approved or  cleared by the [place] FDA and has been authorized for detection and/or diagnosis of SARS-CoV-2 by FDA under an Emergency Use Authorization (EUA).  This EUA will remain in effect (meaning this test can be used) for the duration of the COVID-19 declaration under Section 564(b)(1) of the Act, 21 U.S.C. section 360bbb-3(b)(1), unless the authorization is terminated or revoked sooner.  Performed at [facility], [address] [place], [place] [address]   Blood Culture (routine x 2)     Status: None   Collection Time: 07/18/20  6:40 PM   Specimen: BLOOD  Result Value Ref Range Status   Specimen Description BLOOD RIGHT ANTECUBITAL  Final   Special Requests   Final    BOTTLES DRAWN AEROBIC AND ANAEROBIC Blood Culture adequate volume   Culture   Final    NO GROWTH 5 DAYS Performed at [facility], [address], [place], [place] [address]    Report Status 07/23/2020 FINAL  Final  Urine culture     Status: Abnormal   Collection Time: 07/18/20  8:26 PM   Specimen: Urine, Random  Result Value Ref Range Status   Specimen Description   Final    URINE, RANDOM Performed at [facility], [address] [place], [place] [address]    Special Requests   Final    NONE Performed at [facility], [address],  [place], [place] [address]    Culture (A)  Final    >=100,000 COLONIES/mL ENTEROCOCCUS FAECALIS VANCOMYCIN RESISTANT ENTEROCOCCUS ISOLATED    Report Status 07/21/2020 FINAL  Final   Organism ID, Bacteria ENTEROCOCCUS FAECALIS (A)  Final      Susceptibility   Enterococcus faecalis - MIC*    AMPICILLIN <=2 SENSITIVE Sensitive     NITROFURANTOIN <=16 SENSITIVE Sensitive     VANCOMYCIN >=32 RESISTANT Resistant     LINEZOLID 2 SENSITIVE Sensitive     * >=100,000 COLONIES/mL ENTEROCOCCUS FAECALIS  MRSA PCR Screening  Status: Abnormal   Collection Time: 07/19/20  3:10 AM   Specimen: Nasal Mucosa; Nasopharyngeal  Result Value Ref Range Status   MRSA by PCR POSITIVE (A) NEGATIVE Final    Comment:        The GeneXpert MRSA Assay (FDA approved for NASAL specimens only), is one component of a comprehensive MRSA colonization surveillance program. It is not intended to diagnose MRSA infection nor to guide or monitor treatment for MRSA infections. RESULT CALLED TO, READ BACK BY AND VERIFIED WITH: STACY CLAY AT 0609 ON 07/19/2020 [place]. Performed at [facility], [address], [place], [place] [address]   Aerobic/Anaerobic Culture (surgical/deep wound)     Status: None (Preliminary result)   Collection Time: 07/22/20  3:00 PM   Specimen: PATH Other; Tissue  Result Value Ref Range Status   Specimen Description   Final    WOUND Performed at [facility], [address] [place], [place] [address]    Special Requests BONE COCCYX  Final   Gram Stain NO WBC SEEN NO ORGANISMS SEEN   Final   Culture   Final    NO GROWTH 2 DAYS NO ANAEROBES ISOLATED; CULTURE IN PROGRESS FOR 5 DAYS Performed at [facility] [address] [place], [place] [address]    Report Status PENDING  Incomplete  Aerobic/Anaerobic Culture (surgical/deep wound)     Status: None (Preliminary result)   Collection Time: 07/22/20  3:04 PM   Specimen: PATH Other; Tissue  Result Value Ref Range  Status   Specimen Description   Final    WOUND Performed at [facility], [address], [place], [place] [address]    Special Requests FECAL DECUBITIS  Final   Gram Stain NO WBC SEEN NO ORGANISMS SEEN   Final   Culture   Final    RARE STAPHYLOCOCCUS AUREUS RARE ENTEROCOCCUS FAECALIS SUSCEPTIBILITIES TO FOLLOW HOLDING FOR POSSIBLE ANAEROBE Performed at [facility], [place] [address] [place], [place] [address]    Report Status PENDING  Incomplete         Radiology Studies: No results found.      Scheduled Meds: . allopurinol  100 mg Oral Daily  . vitamin C  250 mg Oral BID  . Chlorhexidine Gluconate Cloth  6 each Topical Daily  . feeding supplement  237 mL Oral TID BM  . insulin aspart  0-6 Units Subcutaneous Q4H  . mirtazapine  15 mg Oral QHS  . multivitamin with minerals  1 tablet Oral Daily   Continuous Infusions: . sodium chloride 75 mL/hr at 07/25/20 1557  . ampicillin-sulbactam (UNASYN) IV 3 g (07/26/20 0517)     LOS: 8 days    Time spent: 25 minutes    Sidney Ace, MD [facility] Pager [phone]  If 7PM-7AM, please contact night-coverage 07/26/2020, 12:24 PM

## 2020-07-26 NOTE — Progress Notes (Signed)
Physical Therapy Treatment/Discharge Patient Details Name: [name] MRN: [id number] DOB: Jul 21, 1929 Today's Date: 07/26/2020    History of Present Illness 87 y.o. female with medical history significant of dementia, diastolic CHF s/p Lumbar fusion (Oct 2021) due to fall and L3 fx, anemia, atrial flutter, breast cancer status post radiation therapy, CKD stage III, history of CVA, history of DVT left leg postoperatively, esophageal stricture, GERD,  C. difficile infection, left foot pressure ulcer, severe osteopenia, falls.  Pt noted with small amount of acute intraventricular hemorrhage on 2/7 imaging (no recent traumatic event noted); Neurosurgery consulted.    PT Comments    Patient seen for PT treatment session. Patient initially agreeable to attempt sitting up on edge of bed. With minimal movement of LE towards edge of bed provided by therapist, patient yelling out to stop and was resistive to further movement despite encouragement, redirection, attempts to lessen anxiety. Patient appears to have increased anxiety and reports pain with minimal movement. Unable to progress mobility further at this time. Total assistance for rolling/repositioning in bed with cues for technique and education on importance of routine repositioning for skin integrity. Patient was also resistive to attempts at gentle ROM of extremities. Given limited participation overall demonstrated with multiple therapy encounters, resistance to active therapy efforts, and no carry over of instruction, PT will sign off at this time.    Follow Up Recommendations  SNF     Equipment Recommendations   (to be determined at next level of care)    Recommendations for Other Services       Precautions / Restrictions Precautions Precautions: Fall Restrictions Weight Bearing Restrictions: No    Mobility  Bed Mobility Overal bed mobility: Needs Assistance Bed Mobility: Rolling     Supine to sit: Total assist      General bed mobility comments: attempted to sit patient upright on edge of bed. patient was agreeable to at first. assistance provided with BLE support towards edge of bed. patient becoming increasingly agitated and yelling out to stop with minimal movement of LE. unable to progress mobility further at this time. total assistance for repositioning in bed, rolling to right with verbal cues for technique    Transfers                    Ambulation/Gait                 Stairs             Wheelchair Mobility    Modified Rankin (Stroke Patients Only)       Balance                                            Cognition Arousal/Alertness: Awake/alert Behavior During Therapy: Anxious Overall Cognitive Status: No family/caregiver present to determine baseline cognitive functioning                                 General Comments: patient needs cues for attention to task. difficulty with following one step commands      Exercises Other Exercises Other Exercises: attempted gentle ROM of LE and UE, however patient resistive with ROM efforts    General Comments        Pertinent Vitals/Pain Pain Assessment: Faces Faces Pain Scale: Hurts whole lot Pain Location:  lower back Pain Descriptors / Indicators: Moaning Pain Intervention(s): Limited activity within patient's tolerance;Repositioned    Home Living                      Prior Function            PT Goals (current goals can now be found in the care plan section) Acute Rehab PT Goals Patient Stated Goal: none stated Progress towards PT goals: Not progressing toward goals - comment (will be discharged from PT)    Frequency           PT Plan  (patient will be discharged today from PT)    Co-evaluation              AM-PAC PT "6 Clicks" Mobility   Outcome Measure  Help needed turning from your back to your side while in a flat bed without using  bedrails?: Total Help needed moving from lying on your back to sitting on the side of a flat bed without using bedrails?: Total Help needed moving to and from a bed to a chair (including a wheelchair)?: Total Help needed standing up from a chair using your arms (e.g., wheelchair or bedside chair)?: Total Help needed to walk in hospital room?: Total Help needed climbing 3-5 steps with a railing? : Total 6 Click Score: 6    End of Session   Activity Tolerance: Patient limited by pain;Treatment limited secondary to agitation Patient left: in bed;with call bell/phone within reach (bilateral heel lift boots in place, patient preferred positioniong towards right side for comfort) Nurse Communication: Mobility status PT Visit Diagnosis: Muscle weakness (generalized) (M62.81);Difficulty in walking, not elsewhere classified (R26.2)     Time: 4327-6147 PT Time Calculation (min) (ACUTE ONLY): 26 min  Charges:  $Therapeutic Activity: 23-37 mins                     Minna Merritts, PT, MPT    Percell Locus 07/26/2020, 1:22 PM

## 2020-07-26 NOTE — Plan of Care (Signed)
Continuing with plan of care. 

## 2020-07-27 DIAGNOSIS — E43 Unspecified severe protein-calorie malnutrition: Secondary | ICD-10-CM | POA: Diagnosis not present

## 2020-07-27 DIAGNOSIS — I959 Hypotension, unspecified: Secondary | ICD-10-CM | POA: Diagnosis not present

## 2020-07-27 DIAGNOSIS — L89154 Pressure ulcer of sacral region, stage 4: Secondary | ICD-10-CM

## 2020-07-27 DIAGNOSIS — E860 Dehydration: Secondary | ICD-10-CM | POA: Diagnosis not present

## 2020-07-27 DIAGNOSIS — Z1621 Resistance to vancomycin: Secondary | ICD-10-CM

## 2020-07-27 DIAGNOSIS — N179 Acute kidney failure, unspecified: Secondary | ICD-10-CM | POA: Diagnosis not present

## 2020-07-27 DIAGNOSIS — G9341 Metabolic encephalopathy: Secondary | ICD-10-CM | POA: Diagnosis not present

## 2020-07-27 DIAGNOSIS — T148XXD Other injury of unspecified body region, subsequent encounter: Secondary | ICD-10-CM | POA: Diagnosis not present

## 2020-07-27 DIAGNOSIS — Z66 Do not resuscitate: Secondary | ICD-10-CM

## 2020-07-27 DIAGNOSIS — B9562 Methicillin resistant Staphylococcus aureus infection as the cause of diseases classified elsewhere: Secondary | ICD-10-CM

## 2020-07-27 LAB — AEROBIC/ANAEROBIC CULTURE W GRAM STAIN (SURGICAL/DEEP WOUND): Gram Stain: NONE SEEN

## 2020-07-27 LAB — CREATININE, SERUM
Creatinine, Ser: 1.11 mg/dL — ABNORMAL HIGH (ref 0.44–1.00)
GFR, Estimated: 48 mL/min — ABNORMAL LOW (ref >60)

## 2020-07-27 LAB — CBC WITH DIFFERENTIAL/PLATELET
Abs Immature Granulocytes: 0.17 10*3/uL — ABNORMAL HIGH (ref 0.00–0.07)
Basophils Absolute: 0 10*3/uL (ref 0.0–0.1)
Basophils Relative: 0 %
Eosinophils Absolute: 0.4 10*3/uL (ref 0.0–0.5)
Eosinophils Relative: 4 %
HCT: 32.4 % — ABNORMAL LOW (ref 36.0–46.0)
Hemoglobin: 10.1 g/dL — ABNORMAL LOW (ref 12.0–15.0)
Immature Granulocytes: 2 %
Lymphocytes Relative: 18 %
Lymphs Abs: 1.7 10*3/uL (ref 0.7–4.0)
MCH: 27.2 pg (ref 26.0–34.0)
MCHC: 31.2 g/dL (ref 30.0–36.0)
MCV: 87.1 fL (ref 80.0–100.0)
Monocytes Absolute: 0.7 10*3/uL (ref 0.1–1.0)
Monocytes Relative: 8 %
Neutro Abs: 6.8 10*3/uL (ref 1.7–7.7)
Neutrophils Relative %: 68 %
Platelets: 306 10*3/uL (ref 150–400)
RBC: 3.72 MIL/uL — ABNORMAL LOW (ref 3.87–5.11)
RDW: 18.5 % — ABNORMAL HIGH (ref 11.5–15.5)
WBC: 9.8 10*3/uL (ref 4.0–10.5)
nRBC: 0.2 % (ref 0.0–0.2)

## 2020-07-27 LAB — GLUCOSE, CAPILLARY
Glucose-Capillary: 105 mg/dL — ABNORMAL HIGH (ref 70–99)
Glucose-Capillary: 112 mg/dL — ABNORMAL HIGH (ref 70–99)
Glucose-Capillary: 117 mg/dL — ABNORMAL HIGH (ref 70–99)
Glucose-Capillary: 142 mg/dL — ABNORMAL HIGH (ref 70–99)
Glucose-Capillary: 83 mg/dL (ref 70–99)
Glucose-Capillary: 89 mg/dL (ref 70–99)
Glucose-Capillary: 94 mg/dL (ref 70–99)
Glucose-Capillary: 97 mg/dL (ref 70–99)
Glucose-Capillary: 98 mg/dL (ref 70–99)

## 2020-07-27 LAB — MAGNESIUM: Magnesium: 1.8 mg/dL (ref 1.7–2.4)

## 2020-07-27 MED ORDER — HYDRALAZINE HCL 20 MG/ML IJ SOLN
10.0000 mg | INTRAMUSCULAR | Status: DC | PRN
  Administered 2020-07-29 – 2020-08-06 (×4): 10 mg via INTRAVENOUS
  Filled 2020-07-27 (×4): qty 1

## 2020-07-27 MED ORDER — AMLODIPINE BESYLATE 5 MG PO TABS
5.0000 mg | ORAL_TABLET | Freq: Every day | ORAL | Status: DC
  Administered 2020-07-27 – 2020-08-10 (×13): 5 mg via ORAL
  Filled 2020-07-27 (×14): qty 1

## 2020-07-27 NOTE — Care Plan (Addendum)
Pt ate 10% of her breakfast with extra salt and ice water given. Pt refused lunch. Kept her lips sealed together.  Pt refused dinner at this time.  Will try again with dinner

## 2020-07-27 NOTE — Progress Notes (Signed)
PROGRESS NOTE    [name]  BJS:[id number] DOB: [birth year] DOA: 07/18/2020 PCP: Marie Borg, MD   Brief Narrative:  [name] Coveyis a 87 y.o.femalewith medical history significant of dementia, diastolic CHF s/p Lumbar fusion due to L3 frx and repair of traumatic CSF leakOct 2021,anemia, atrial flutter, breast cancer status post radiation therapy, CKD stage III, history of CVA, history of DVT left leg postoperatively, is a esophageal stricture,GERD, C. difficile infection,left foot pressure ulcer,severe osteopenia,falls.Presented withHypotension initial BP at facility down to 96/34 EMS gave 500 ml bolus and BP was 131/48 Per nursing staff patient have had decreased urine output. She has been somnolent/confused while here. She is found with AKI, Ucx+, and PNA . Ct head obtained +for acute intrav. Bleed, but trace. Neurosurgery was consulted.  2/6-confused, quiet.  2/7-sitting up in bed, still confused, mumbling something. (spoke to son, states since October she has been "mumbling" and not being her self. Prior to October (before surgery) pt apparently was active and sharp. 2/8-somnolent today. Does not open eyes for me. Per nsg does take meds . CT head obtained-see result 2/9: Patient more interactive.  Nonsensical speech.  Does not appear in any distress.  Patient n.p.o. for surgical debridement today.  Remains on IV antibiotics. 2/10: Postop day 1 status post wound debridement with general surgery.  Tolerated procedure well.  Wound VAC in place.  Seen by general surgery this morning. 2/11: Palliative care discussed with patient's son [name].  At this time still pursuing full scope of treatment including full CODE STATUS. 2/12: Plan of care discussed with patient's son [name] via phone.  Confirmed DNR status.  Order changed to computer and DNR form signed and left in paper chart.  Patient's mental status has been waxing and waning.  She seems okay in the morning however sundown's  rather markedly towards the afternoon and evening.  P.o. intake remains poor. 2/13: Patient's mental status is remained poor despite aggressive efforts.  Frequent reorienting measures, frequent reassurance, frequent attempts to feed the patient, added salt to the meals, ice water.  I have discussed the case with patient's bedside RN, registered dietitian, speech-language pathologist. 2/14: No clinical changes. Patient remains in pain. Per bedside RN patient had a little bit improved p.o. intake yesterday.   Assessment & Plan:   Active Problems:   Diabetes ([name])   Hyperlipidemia   Essential hypertension   Malignant neoplasm of upper-outer quadrant of right breast in female, estrogen receptor positive ([name])   Wound healing, delayed   AKI (acute kidney injury) ([name])   CKD (chronic kidney disease) stage 3, GFR 30-59 ml/min ([name])   Chronic diastolic CHF (congestive heart failure) ([name])   Protein-calorie malnutrition, severe ([name])   Dehydration   Hyperkalemia   CAP (community acquired pneumonia)   Acute metabolic encephalopathy   Sacral decubitus ulcer, stage IV ([name])  Acute metabolic encephalopathy Suspected hospital-acquired delirium  most likely multifactorial secondary to combination of infection ,dehydration, AKI, and acute intraventricular hemorrhage.  -Baseline mental status difficult to ascertain given underlying dementia -Per neurosurgery-no further inpatient intervention necessary Plan: DC IV fluids Monitor UOP and ensure no retention Continue Unasyn per ID recommendations Frequent neuro checks Avoid sedatives Ensure pain control As needed atypical antipsychotics for agitation not related to pain  Sacral decubitus ulcer Status post or debridement 07/22/2020 Tolerated debridement, wound VAC placed Surgery following for postoperative wound checks, signed off W OC following Wound growing VRE and MRSA Plan:  Continue Unasyn  Vancomycin added Appreciate ID  follow-up Continue wound VAC WOC consult Pain control as needed   Protein-calorie malnutrition, severe  Long conversation with patient son to him on 07/24/2020.  Patient has been dealing with significantly poor p.o. intake worse over the past several months since a mechanical fall.  10 relayed that the patient needs ice cold water and likes putting a substantial amount of salt on her food.  From my end if we can improve the patient's oral intake by adding salt to food that is acceptable. -Also discussed with registered dietitian who is familiar with the patient.  Given her level of malnutrition she would be a reasonable candidate for D[place] and tube feeds however given her poor mental status and frequent sundowning she will likely discontinue NG tube.  Also feel she is a poor candidate for PEG tube as this carries the same risks of self discontinuation and aspiration. -Mental status has remained poor and repeated efforts to feed the patient, including food she likes by nursing staff have been overall unsuccessful.  Patient frequently refuses to eat and his hand away.  At this time I feel her mental status is far to labile and poor to justify NG tube placement as she will likely pull tube out. Plan: Continue to encourage p.o. intake is much as possible Add salt to foods Continue nightly Remeron, started 07/24/2020.   Medication may take several weeks to see any appreciable effect  Hypernatremia Improving Likely owing to poor p.o. fluid intake P.o. fluid intake slowly improving Plan: DC IV fluids  UTI +UA and ucx with Enter faeca.  Vancomycin-resistant Enterococcus isolated. ID consulted. Per ID Enterococcus is likely is contaminant Confirmed with RN, Foley was exchanged on admission including tubing and bag Plan: Continue Unasyn per infectious disease recommendations  Possible pneumonia, unlikely Per ID chest x-ray likely represents atelectasis White blood cell high level of  normal Procalcitonin trending down Plan: On Unasyn for wound infection Oxygen as needed  Acute intraventricular hemorrhage-Trace Neurosurgery consulted Cleared for surgery No further intervention  Hyperlipidemia -chronic stable not on statin  Essential hypertension hold home medications given hypotension on admission  Chronic diastolic CHF  currently appears to be in a dry side Hold lasix  AKI(acute kidney injury) ([place]) -appears to be prerenal-dehydration, on ACE and Lasix.   Hold lisinopril rehydrate and continue to follow RENAL US showing no evidence of hydronephrosis 2/13: Creatinine continues to improve.  1.18 today Continue to monitor  DM2--controlled A1c 5.6 TSH 5.4, will need recheck in 4 weeks when acute phase illness improves   DVT prophylaxis: SCD Code Status: Full Family Communication: Brieonna Crutcher [phone] on 07/26/2020 Disposition Plan: Status is: Inpatient  Remains inpatient appropriate because:Inpatient level of care appropriate due to severity of illness   Dispo: The patient is from: Home              Anticipated d/c is to: SNF              Anticipated d/c date is: > 3 days              Patient currently is not medically stable to d/c.   Difficult to place patient No  Patient's oral intake remains quite poor. Mental status waxes and wanes. P.o. intake slowly picking up. Will need skilled nursing facility at time of discharge. Postoperative day #5.    Level of care: Med-Surg  Consultants:   General surgery  Palliative care  Procedures:   Surgical debridement of sacral decubitus ulcer, 07/22/2020  Antimicrobials:  Unasyn   Subjective: Patient seen and examined.  Confused this morning but overall calm.  Objective: Vitals:   07/26/20 1936 07/27/20 0013 07/27/20 0412 07/27/20 0909  BP: (!) 145/121 (!) 160/144 (!) 155/120 (!) 174/88  Pulse: (!) 101 (!) 46 100 73  Resp:    20  Temp: 97.6 F (36.4 C) 98.1 F (36.7 C) 98.3  F (36.8 C) 98.2 F (36.8 C)  TempSrc: Oral Oral Oral Oral  SpO2: 99% 99% 99% 99%  Weight:      Height:        Intake/Output Summary (Last 24 hours) at 07/27/2020 1203 Last data filed at 07/27/2020 0900 Gross per 24 hour  Intake 2314.61 ml  Output 800 ml  Net 1514.61 ml   Filed Weights   07/18/20 1603  Weight: 98 kg    Examination:  General exam: No acute distress.  Confused.  Frail Respiratory system: Bibasilar crackles.  Normal work of breathing.  Room air Cardiovascular system: S1-S2, regular rate and rhythm,+murmur Gastrointestinal system: Soft, nontender, nondistended, normal bowel sounds Central nervous system: Alert, confused, oriented x1, no focal deficits Extremities: Symmetric 5 x 5 power. Skin: Sacral decubitus status post debridement.  Wound VAC in place Psychiatry: Judgement and insight appear impaired. Mood & affect flattened.     Data Reviewed: I have personally reviewed following labs and imaging studies  CBC: Recent Labs  Lab 07/23/20 0402 07/24/20 0707 07/25/20 0425 07/26/20 0438 07/27/20 0602  WBC 6.2 10.9* 9.2 12.0* 9.8  NEUTROABS 5.3 8.9* 6.7 7.3 6.8  HGB 10.5* 10.8* 9.0* 11.0* 10.1*  HCT 35.6* 35.4* 29.6* 36.1 32.4*  MCV 90.4 88.5 89.4 88.5 87.1  PLT 270 320 283 374 734   Basic Metabolic Panel: Recent Labs  Lab 07/22/20 0751 07/23/20 0402 07/24/20 0707 07/25/20 0425 07/26/20 0438 07/27/20 0602  NA 148* 147* 147* 145 143  --   K 3.5 4.2 3.8 3.1* 3.8  --   CL 108 107 106 107 104  --   CO2 24 23 26 29 26   --   GLUCOSE 107* 133* 121* 112* 96  --   BUN 70* 63* 61* 50* 41*  --   CREATININE 1.82* 1.76* 1.50* 1.18* 1.18* 1.11*  CALCIUM 10.0 9.8 10.1 9.2 9.8  --   MG 1.8 1.8 1.8 1.5* 2.2 1.8   GFR: Estimated Creatinine Clearance: 40.6 mL/min (A) (by C-G formula based on SCr of 1.11 mg/dL (H)). Liver Function Tests: No results for input(s): AST, ALT, ALKPHOS, BILITOT, PROT, ALBUMIN in the last 168 hours. No results for input(s):  LIPASE, AMYLASE in the last 168 hours. No results for input(s): AMMONIA in the last 168 hours. Coagulation Profile: No results for input(s): INR, PROTIME in the last 168 hours. Cardiac Enzymes: No results for input(s): CKTOTAL, CKMB, CKMBINDEX, TROPONINI in the last 168 hours. BNP (last 3 results) No results for input(s): PROBNP in the last 8760 hours. HbA1C: No results for input(s): HGBA1C in the last 72 hours. CBG: Recent Labs  Lab 07/27/20 0035 07/27/20 0408 07/27/20 0824 07/27/20 0908 07/27/20 1121  GLUCAP 105* 94 89 97 142*   Lipid Profile: No results for input(s): CHOL, HDL, LDLCALC, TRIG, CHOLHDL, LDLDIRECT in the last 72 hours. Thyroid Function Tests: No results for input(s): TSH, T4TOTAL, FREET4, T3FREE, THYROIDAB in the last 72 hours. Anemia Panel: No results for input(s): VITAMINB12, FOLATE, FERRITIN, TIBC, IRON, RETICCTPCT in the last 72 hours. Sepsis Labs: No results for input(s): PROCALCITON, LATICACIDVEN in the last 168 hours.  Recent  Results (from the past 240 hour(s))  Blood Culture (routine x 2)     Status: None   Collection Time: 07/18/20  4:30 PM   Specimen: BLOOD  Result Value Ref Range Status   Specimen Description BLOOD BLOOD RIGHT FOREARM  Final   Special Requests   Final    BOTTLES DRAWN AEROBIC AND ANAEROBIC Blood Culture adequate volume   Culture   Final    NO GROWTH 5 DAYS Performed at [facility], [address] [place], [place] [address]    Report Status 07/23/2020 FINAL  Final  SARS Coronavirus 2 by RT PCR (hospital order, performed in [place] hospital lab) Nasopharyngeal Nasopharyngeal Swab     Status: None   Collection Time: 07/18/20  5:39 PM   Specimen: Nasopharyngeal Swab  Result Value Ref Range Status   SARS Coronavirus 2 NEGATIVE NEGATIVE Final    Comment: (NOTE) SARS-CoV-2 target nucleic acids are NOT DETECTED.  The SARS-CoV-2 RNA is generally detectable in upper and lower respiratory specimens during the acute  phase of infection. The lowest concentration of SARS-CoV-2 viral copies this assay can detect is 250 copies / mL. A negative result does not preclude SARS-CoV-2 infection and should not be used as the sole basis for treatment or other patient management decisions.  A negative result may occur with improper specimen collection / handling, submission of specimen other than nasopharyngeal swab, presence of viral mutation(s) within the areas targeted by this assay, and inadequate number of viral copies (<250 copies / mL). A negative result must be combined with clinical observations, patient history, and epidemiological information.  Fact Sheet for Patients:   StrictlyIdeas.no  Fact Sheet for Healthcare Providers: BankingDealers.co.za  This test is not yet approved or  cleared by the [place] FDA and has been authorized for detection and/or diagnosis of SARS-CoV-2 by FDA under an Emergency Use Authorization (EUA).  This EUA will remain in effect (meaning this test can be used) for the duration of the COVID-19 declaration under Section 564(b)(1) of the Act, 21 U.S.C. section 360bbb-3(b)(1), unless the authorization is terminated or revoked sooner.  Performed at [facility], [address] [place], [place] [address]   Blood Culture (routine x 2)     Status: None   Collection Time: 07/18/20  6:40 PM   Specimen: BLOOD  Result Value Ref Range Status   Specimen Description BLOOD RIGHT ANTECUBITAL  Final   Special Requests   Final    BOTTLES DRAWN AEROBIC AND ANAEROBIC Blood Culture adequate volume   Culture   Final    NO GROWTH 5 DAYS Performed at [facility], [address], [place], [place] [address]    Report Status 07/23/2020 FINAL  Final  Urine culture     Status: Abnormal   Collection Time: 07/18/20  8:26 PM   Specimen: Urine, Random  Result Value Ref Range Status   Specimen Description   Final     URINE, RANDOM Performed at [facility], [address], [place], [place] [address]    Special Requests   Final    NONE Performed at [facility], [address] [place], [place] [address]    Culture (A)  Final    >=100,000 COLONIES/mL ENTEROCOCCUS FAECALIS VANCOMYCIN RESISTANT ENTEROCOCCUS ISOLATED    Report Status 07/21/2020 FINAL  Final   Organism ID, Bacteria ENTEROCOCCUS FAECALIS (A)  Final      Susceptibility   Enterococcus faecalis - MIC*    AMPICILLIN <=2 SENSITIVE  Sensitive     NITROFURANTOIN <=16 SENSITIVE Sensitive     VANCOMYCIN >=32 RESISTANT Resistant     LINEZOLID 2 SENSITIVE Sensitive     * >=100,000 COLONIES/mL ENTEROCOCCUS FAECALIS  MRSA PCR Screening     Status: Abnormal   Collection Time: 07/19/20  3:10 AM   Specimen: Nasal Mucosa; Nasopharyngeal  Result Value Ref Range Status   MRSA by PCR POSITIVE (A) NEGATIVE Final    Comment:        The GeneXpert MRSA Assay (FDA approved for NASAL specimens only), is one component of a comprehensive MRSA colonization surveillance program. It is not intended to diagnose MRSA infection nor to guide or monitor treatment for MRSA infections. RESULT CALLED TO, READ BACK BY AND VERIFIED WITH: STACY CLAY AT 0609 ON 07/19/2020 [employer]. Performed at [facility], [address], [place], [place] [address]   Aerobic/Anaerobic Culture (surgical/deep wound)     Status: None (Preliminary result)   Collection Time: 07/22/20  3:00 PM   Specimen: PATH Other; Tissue  Result Value Ref Range Status   Specimen Description   Final    WOUND Performed at [facility], [address] [place], [place] [address]    Special Requests BONE COCCYX  Final   Gram Stain NO WBC SEEN NO ORGANISMS SEEN   Final   Culture   Final    NO GROWTH 4 DAYS NO ANAEROBES ISOLATED; CULTURE IN PROGRESS FOR 5 DAYS Performed at [facility] [address] [place], [place] [address]    Report Status PENDING   Incomplete  Aerobic/Anaerobic Culture (surgical/deep wound)     Status: None   Collection Time: 07/22/20  3:04 PM   Specimen: PATH Other; Tissue  Result Value Ref Range Status   Specimen Description   Final    WOUND Performed at [facility], [address] [place], [place] [address]    Special Requests FECAL DECUBITIS  Final   Gram Stain NO WBC SEEN NO ORGANISMS SEEN   Final   Culture   Final    RARE METHICILLIN RESISTANT STAPHYLOCOCCUS AUREUS RARE VANCOMYCIN RESISTANT ENTEROCOCCUS ISOLATED RARE BACTEROIDES THETAIOTAOMICRON BETA LACTAMASE POSITIVE Performed at [facility], [address] [place], [place] [address]    Report Status 07/27/2020 FINAL  Final   Organism ID, Bacteria METHICILLIN RESISTANT STAPHYLOCOCCUS AUREUS  Final   Organism ID, Bacteria VANCOMYCIN RESISTANT ENTEROCOCCUS ISOLATED  Final      Susceptibility   Methicillin resistant staphylococcus aureus - MIC*    CIPROFLOXACIN >=8 RESISTANT Resistant     ERYTHROMYCIN >=8 RESISTANT Resistant     GENTAMICIN <=0.5 SENSITIVE Sensitive     OXACILLIN >=4 RESISTANT Resistant     TETRACYCLINE <=1 SENSITIVE Sensitive     VANCOMYCIN 1 SENSITIVE Sensitive     TRIMETH/SULFA >=320 RESISTANT Resistant     CLINDAMYCIN <=0.25 SENSITIVE Sensitive     RIFAMPIN <=0.5 SENSITIVE Sensitive     Inducible Clindamycin NEGATIVE Sensitive     * RARE METHICILLIN RESISTANT STAPHYLOCOCCUS AUREUS   Vancomycin resistant enterococcus isolated - MIC*    AMPICILLIN <=2 SENSITIVE Sensitive     VANCOMYCIN >=32 RESISTANT Resistant     GENTAMICIN SYNERGY SENSITIVE Sensitive     LINEZOLID 2 SENSITIVE Sensitive     * RARE VANCOMYCIN RESISTANT ENTEROCOCCUS ISOLATED         Radiology Studies: No results found.      Scheduled Meds: . allopurinol  100 mg Oral Daily  . amLODipine  5 mg Oral Daily  . vitamin C  250 mg Oral BID  . Chlorhexidine Gluconate Cloth  6 each Topical Daily  . feeding supplement  237 mL Oral TID BM   . insulin aspart  0-6 Units Subcutaneous Q4H  . mirtazapine  15 mg Oral QHS  . multivitamin with minerals  1 tablet Oral Daily   Continuous Infusions: . ampicillin-sulbactam (UNASYN) IV 3 g (07/27/20 0542)  . vancomycin       LOS: 9 days    Time spent: 25 minutes    Sidney Ace, MD [facility] Pager [phone]  If 7PM-7AM, please contact night-coverage 07/27/2020, 12:03 PM

## 2020-07-27 NOTE — Treatment Plan (Addendum)
Son [name] called and updated. Spoke to him about patients wound and went over note from wound care. Son concerned about wound on sacrum at this time. Son and family have not spoke about comfort care states they are waiting for what the "good lord has in store for her."

## 2020-07-27 NOTE — Consult Note (Signed)
WOC Nurse Consult Note: Reason for Consult: Routine NPWT dressing change to sacral Stage 4 pressure injury. Wound type:Pressure Pressure Injury POA: Yes Measurement:Per Friday: 7cm x 17cm x 1.5cm Wound bed:30% bone, 30% dark red, 40% lighter red Drainage (amount, consistency, odor) small amount serous Periwound: Blanching area of erythema persists from 8-9 o'clock. Two full thickness wounds at 5 and 7 o'clock that rea covered with yellow nonviable tissue. Dressing procedure/placement/frequency: three pieces of black foram are removed (one to wound, two served as bridge). Wound cleansed with NS and patted dry, skin barrier ring strip placed to cover 3/4 of wound periphery. One pice of black foam used to obliterate dead space, this is secured with drape. A foam bridge is fashioned from black foam and placed upon the layer of drape that covers the skin. This is then sealed with drape and a T.R.A.C. pad attached.  An immediate seal is achieved and maintained at 161mmHg continuous negative pressure.  Note: This is a palliative wound, Stage 4 pressure injury with a central, large area of exposed bone in the presence of fecal incontinence contaminating the area several times daily. The nutritional demands required to heal a wound of this size and severity as well as the left heel Stage 3 pressure injury supercede any intake the patient could ingest.There are several comorbid conditions and the patient is not mobile or able to self-initiate position changes. The care of this patient does not appear to be able to be managed with intermittent skilled nursing services (i.e., [facility]) at home. If the fecal incontinence persists and a seal to the NPWT dressing is not able to be maintained, would recommend three times daily and PRN NS dressings, if Surgery is in agreement.   Recommend conversation pertaining to goals of care, also placement in a skilled or Rehab facility of the family's choosing.  If you agree, please  initiate conversations, involve TOC.  [facility] nursing team will continue to follow, changing the NPWT dressing three times weekly, and will remain available to this patient, the nursing, surgical  and medical teams.   Next dressing change is Wednesday, 07/29/20.  Supplies are in room.  Thanks, Maudie Flakes, MSN, RN, [place], Arther Abbott  Pager# [phone]

## 2020-07-27 NOTE — Progress Notes (Signed)
Date of Admission:  07/18/2020      87 y.o. female with multiple comorbidities presented from the nursing home with hypotension . Per nursing staff patient had decreased urine output.  BP was 96/34 and they gave some saline bolus and which increase the BP to 131/48.Marland Kitchen  She was then sent to the hospital.  On 03/19/2019 she was taken to [facility] after a fall and sustaining injury to her back.  She had an L3vertebral fracture and retroperitoneal hematoma.  She underwent L2-L4 laminectomy and posterior l, acute blood loss anemia requiring blood transfusion, hypotension requiring pressors was of diuresis ateral stabilization of L1-L5 on 03/20/2020.  Traumatic CSF leak was repaired.  During that hospitalization she was also treated for with vancomycin cefepime for 7 days for pneumonia.  She was also in AKI and nephrology was consulted.  She was in ICU for acute hypoxic hypercarbic respiratory failure requiring BiPAP as well .  Patient improved and was discharged to SNF. She was readmitted to [facility] on 04/22/2020 with dehiscence of lumbar surgical wound and with infection.  Wound cultures taken showed E. coli and staph hemolyticus.  She also had E. coli UTI because of an indwelling Foley catheter.  On 04/24/2020 she underwent debridement and wound VAC placement.  This was followed by delayed primary closure with incisional wound VAC placement on 04/27/2020.  ID saw the patient and recommended 8 to 12 weeks of ceftriaxone with possible transition to p.o. antibiotics after 05/22/2020.  She was seen by ID as outpatient on 05/15/2020 and ceftriaxone was stopped and she was switched to p.o. ciprofloxacin to complete the duration of therapy to target the E. coli and Proteus mirabilis.  The end date was 06/19/2020.  She was seen in the neurosurgery's office on 06/16/2020 and during that visit there was some bruising to her left wrist and a decubitus ulcer on the left foot.  The lumbar spine wound seemed be  healing slowly..  ID: [name] is a 87 y.o. female Active Problems:   Diabetes ([place])   Hyperlipidemia   Essential hypertension   Malignant neoplasm of upper-outer quadrant of right breast in female, estrogen receptor positive ([place])   Wound healing, delayed   AKI (acute kidney injury) ([place])   CKD (chronic kidney disease) stage 3, GFR 30-59 ml/min ([name])   Chronic diastolic CHF (congestive heart failure) ([name])   Protein-calorie malnutrition, severe ([name])   Dehydration   Hyperkalemia   CAP (community acquired pneumonia)   Acute metabolic encephalopathy   Sacral decubitus ulcer, stage IV ([name])    Subjective: Not available  Medications:  . allopurinol  100 mg Oral Daily  . amLODipine  5 mg Oral Daily  . vitamin C  250 mg Oral BID  . Chlorhexidine Gluconate Cloth  6 each Topical Daily  . feeding supplement  237 mL Oral TID BM  . insulin aspart  0-6 Units Subcutaneous Q4H  . mirtazapine  15 mg Oral QHS  . multivitamin with minerals  1 tablet Oral Daily    Objective: Vital signs in last 24 hours: Temp:  [97.4 F (36.3 C)-98.3 F (36.8 C)] 98.2 F (36.8 C) (02/14 0909) Pulse Rate:  [46-101] 73 (02/14 0909) Resp:  [16-20] 20 (02/14 0909) BP: (145-174)/(88-144) 174/88 (02/14 0909) SpO2:  [94 %-99 %] 99 % (02/14 0909)  PHYSICAL EXAM:  General: Patient confused.  Little bit more calm today. Back: Sacral wound covered with decubitus Lungs: Bilateral air entry. Heart: S1-S2 Abdomen: Soft,  Extremities: atraumatic, no cyanosis. No edema. No clubbing Skin: Left heel pressure injury. Lymph: Cervical, supraclavicular normal. Neurologic: Moves all limbs spontaneously.  Lab Results Recent Labs    07/25/20 0425 07/26/20 0438 07/27/20 0602  WBC 9.2 12.0* 9.8  HGB 9.0* 11.0* 10.1*  HCT 29.6* 36.1 32.4*  NA 145 143  --   K 3.1* 3.8  --   CL 107 104  --   CO2 29 26  --   BUN 50* 41*  --   CREATININE 1.18* 1.18* 1.11*   Liver Panel No results for input(s): PROT,  ALBUMIN, AST, ALT, ALKPHOS, BILITOT, BILIDIR, IBILI in the last 72 hours. Sedimentation Rate No results for input(s): ESRSEDRATE in the last 72 hours. C-Reactive Protein No results for input(s): CRP in the last 72 hours.  Microbiology:  Studies/Results: No results found.   Assessment/Plan:  87 year old female presenting in the nursing home with hypotension  Hypotension due to dehydration has resolved  Acute metabolic encephalopathy/delirium present but better than before  Stage III sacral decubitus it was an unstageable ulcer which got debrided on 07/22/2020 has a wound VAC.  As per the nurse the bone is exposed Culture of the bone is negative Culture of the tissue shows VRE which is susceptible to ampicillin and she is on that. Also has MRSA. Vancomycin has been added. As it is not a bone infection on discharge she could be sent on p.o. Augmentin and doxycycline for 2 weeks. She would benefit from a diverting colostomy so as to not contaminate the wound.  AKI due to dehydration: Much improved  Recent L3 burst fracture needing surgery with hardware L2-L5 last year this was complicated by wound dehiscence and infection with E. coli and Proteus for which she has been treated with 8 weeks of antibiotics.  Wound has healed.  Patient seen by palliative.  Currently she is DNR but full scope of treatment. ID will sign off call if needed.

## 2020-07-28 ENCOUNTER — Inpatient Hospital Stay: Payer: Medicare Other

## 2020-07-28 DIAGNOSIS — G9341 Metabolic encephalopathy: Secondary | ICD-10-CM | POA: Diagnosis not present

## 2020-07-28 DIAGNOSIS — N179 Acute kidney failure, unspecified: Secondary | ICD-10-CM | POA: Diagnosis not present

## 2020-07-28 DIAGNOSIS — T148XXD Other injury of unspecified body region, subsequent encounter: Secondary | ICD-10-CM | POA: Diagnosis not present

## 2020-07-28 DIAGNOSIS — E43 Unspecified severe protein-calorie malnutrition: Secondary | ICD-10-CM | POA: Diagnosis not present

## 2020-07-28 LAB — BASIC METABOLIC PANEL
Anion gap: 13 (ref 5–15)
BUN: 28 mg/dL — ABNORMAL HIGH (ref 8–23)
CO2: 25 mmol/L (ref 22–32)
Calcium: 9 mg/dL (ref 8.9–10.3)
Chloride: 107 mmol/L (ref 98–111)
Creatinine, Ser: 1.14 mg/dL — ABNORMAL HIGH (ref 0.44–1.00)
GFR, Estimated: 46 mL/min — ABNORMAL LOW (ref >60)
Glucose, Bld: 98 mg/dL (ref 70–99)
Potassium: 2.9 mmol/L — ABNORMAL LOW (ref 3.5–5.1)
Sodium: 145 mmol/L (ref 135–145)

## 2020-07-28 LAB — AEROBIC/ANAEROBIC CULTURE W GRAM STAIN (SURGICAL/DEEP WOUND)
Culture: NO GROWTH
Gram Stain: NONE SEEN

## 2020-07-28 LAB — CBC WITH DIFFERENTIAL/PLATELET
Abs Immature Granulocytes: 0.13 10*3/uL — ABNORMAL HIGH (ref 0.00–0.07)
Basophils Absolute: 0 10*3/uL (ref 0.0–0.1)
Basophils Relative: 0 %
Eosinophils Absolute: 0.4 10*3/uL (ref 0.0–0.5)
Eosinophils Relative: 4 %
HCT: 30.9 % — ABNORMAL LOW (ref 36.0–46.0)
Hemoglobin: 9.6 g/dL — ABNORMAL LOW (ref 12.0–15.0)
Immature Granulocytes: 1 %
Lymphocytes Relative: 17 %
Lymphs Abs: 1.7 10*3/uL (ref 0.7–4.0)
MCH: 26.9 pg (ref 26.0–34.0)
MCHC: 31.1 g/dL (ref 30.0–36.0)
MCV: 86.6 fL (ref 80.0–100.0)
Monocytes Absolute: 0.8 10*3/uL (ref 0.1–1.0)
Monocytes Relative: 7 %
Neutro Abs: 7.3 10*3/uL (ref 1.7–7.7)
Neutrophils Relative %: 71 %
Platelets: 325 10*3/uL (ref 150–400)
RBC: 3.57 MIL/uL — ABNORMAL LOW (ref 3.87–5.11)
RDW: 18.1 % — ABNORMAL HIGH (ref 11.5–15.5)
WBC: 10.4 10*3/uL (ref 4.0–10.5)
nRBC: 0 % (ref 0.0–0.2)

## 2020-07-28 LAB — GLUCOSE, CAPILLARY
Glucose-Capillary: 89 mg/dL (ref 70–99)
Glucose-Capillary: 100 mg/dL — ABNORMAL HIGH (ref 70–99)
Glucose-Capillary: 100 mg/dL — ABNORMAL HIGH (ref 70–99)
Glucose-Capillary: 177 mg/dL — ABNORMAL HIGH (ref 70–99)
Glucose-Capillary: 106 mg/dL — ABNORMAL HIGH (ref 70–99)

## 2020-07-28 LAB — MAGNESIUM: Magnesium: 1.8 mg/dL (ref 1.7–2.4)

## 2020-07-28 MED ORDER — POTASSIUM CHLORIDE 10 MEQ/100ML IV SOLN
10.0000 meq | INTRAVENOUS | Status: AC
  Administered 2020-07-28 (×4): 10 meq via INTRAVENOUS
  Filled 2020-07-28 (×4): qty 100

## 2020-07-28 MED ORDER — MAGNESIUM SULFATE 2 GM/50ML IV SOLN
2.0000 g | Freq: Once | INTRAVENOUS | Status: AC
  Administered 2020-07-28: 2 g via INTRAVENOUS
  Filled 2020-07-28: qty 50

## 2020-07-28 NOTE — Progress Notes (Signed)
PROGRESS NOTE    [name]  XNT:[id number] DOB: 18-Apr-1930 DOA: 07/18/2020 PCP: Biagio Borg, MD   Brief Narrative:  [name] Coveyis a 87 y.o.femalewith medical history significant of dementia, diastolic CHF s/p Lumbar fusion due to L3 frx and repair of traumatic CSF leakOct 2021,anemia, atrial flutter, breast cancer status post radiation therapy, CKD stage III, history of CVA, history of DVT left leg postoperatively, is a esophageal stricture,GERD, C. difficile infection,left foot pressure ulcer,severe osteopenia,falls.Presented withHypotension initial BP at facility down to 96/34 EMS gave 500 ml bolus and BP was 131/48 Per nursing staff patient have had decreased urine output. She has been somnolent/confused while here. She is found with AKI, Ucx+, and PNA . Ct head obtained +for acute intrav. Bleed, but trace. Neurosurgery was consulted.  2/6-confused, quiet.  2/7-sitting up in bed, still confused, mumbling something. (spoke to son, states since October she has been "mumbling" and not being her self. Prior to October (before surgery) pt apparently was active and sharp. 2/8-somnolent today. Does not open eyes for me. Per nsg does take meds . CT head obtained-see result 2/9: Patient more interactive.  Nonsensical speech.  Does not appear in any distress.  Patient n.p.o. for surgical debridement today.  Remains on IV antibiotics. 2/10: Postop day 1 status post wound debridement with general surgery.  Tolerated procedure well.  Wound VAC in place.  Seen by general surgery this morning. 2/11: Palliative care discussed with patient's son [name].  At this time still pursuing full scope of treatment including full CODE STATUS. 2/12: Plan of care discussed with patient's son [name] via phone.  Confirmed DNR status.  Order changed to computer and DNR form signed and left in paper chart.  Patient's mental status has been waxing and waning.  She seems okay in the morning however sundown's  rather markedly towards the afternoon and evening.  P.o. intake remains poor. 2/13: Patient's mental status is remained poor despite aggressive efforts.  Frequent reorienting measures, frequent reassurance, frequent attempts to feed the patient, added salt to the meals, ice water.  I have discussed the case with patient's bedside RN, registered dietitian, speech-language pathologist. 2/14: No clinical changes. Patient remains in pain. Per bedside RN patient had a little bit improved p.o. intake yesterday. 2/15: No clinical status changes.  P.o. intake remains poor.  Encephalopathy waxing and waning.  Communicated with infectious disease to suggest consideration for diverting ostomy.  Communicated with general surgery PA.  Currently do not feel the patient would be a good candidate for diverting colostomy considering her functional status.   Assessment & Plan:   Active Problems:   Diabetes ([place])   Hyperlipidemia   Essential hypertension   Malignant neoplasm of upper-outer quadrant of right breast in female, estrogen receptor positive ([place])   Wound healing, delayed   AKI (acute kidney injury) ([place])   CKD (chronic kidney disease) stage 3, GFR 30-59 ml/min ([name])   Chronic diastolic CHF (congestive heart failure) ([name])   Protein-calorie malnutrition, severe ([name])   Dehydration   Hyperkalemia   CAP (community acquired pneumonia)   Acute metabolic encephalopathy   Sacral decubitus ulcer, stage IV ([name])  Acute metabolic encephalopathy Suspected hospital-acquired delirium  most likely multifactorial secondary to combination of infection ,dehydration, AKI, and acute intraventricular hemorrhage.  -Baseline mental status difficult to ascertain given underlying dementia -Per neurosurgery-no further inpatient intervention necessary Plan: Hold IV fluids at this time Monitor UOP and ensure no retention Continue Unasyn per ID recommendations Continue vancomycin Frequent neuro  checks Avoid  sedatives Ensure pain control As needed atypical antipsychotics for agitation not related to pain  Sacral decubitus ulcer Status post or debridement 07/22/2020 Tolerated debridement, wound VAC placed Surgery following for postoperative wound checks, signed off W OC following Wound growing VRE and MRSA -Likely stool contamination.  Communicated with infectious disease and general surgery.  Though diverting colostomy would minimize the risk of stool contamination patient is not an appropriate surgical candidate at this moment. Plan:  Continue Unasyn  Continue vancomycin Appreciate ID follow-up Per ID we can transition to oral agents at time of discharge Continue wound [facility] consult Pain control as needed   Protein-calorie malnutrition, severe  Long conversation with patient son to him on 07/24/2020.  Patient has been dealing with significantly poor p.o. intake worse over the past several months since a mechanical fall.  10 relayed that the patient needs ice cold water and likes putting a substantial amount of salt on her food.  From my end if we can improve the patient's oral intake by adding salt to food that is acceptable. -Also discussed with registered dietitian who is familiar with the patient.  Given her level of malnutrition she would be a reasonable candidate for D[place] and tube feeds however given her poor mental status and frequent sundowning she will likely discontinue NG tube.  Also feel she is a poor candidate for PEG tube as this carries the same risks of self discontinuation and aspiration. -Mental status has remained poor and repeated efforts to feed the patient, including food she likes by nursing staff have been overall unsuccessful.  Patient frequently refuses to eat and his hand away.  At this time I feel her mental status is far to labile and poor to justify NG tube placement as she will likely pull tube out. Plan: Continue to encourage p.o. intake is much as  possible Add salt to foods Continue nightly Remeron, started 07/24/2020.   Medication may take several weeks to see any appreciable effect  Hypernatremia Improving Likely owing to poor p.o. fluid intake P.o. fluid intake slowly improving Plan: No IV fluids at this time  UTI +UA and ucx with Enter faeca.  Vancomycin-resistant Enterococcus isolated. ID consulted. Per ID Enterococcus is likely is contaminant Confirmed with RN, Foley was exchanged on admission including tubing and bag Plan: Continue Unasyn per infectious disease recommendations  Possible pneumonia, unlikely Per ID chest x-ray likely represents atelectasis White blood cell high level of normal Procalcitonin trending down Plan: On Unasyn for wound infection Oxygen as needed  Acute intraventricular hemorrhage-Trace Neurosurgery consulted Cleared for surgery No further intervention  Hyperlipidemia -chronic stable not on statin  Essential hypertension hold home medications given hypotension on admission  Chronic diastolic CHF  currently appears to be in a dry side Hold lasix  AKI(acute kidney injury) ([place]) -appears to be prerenal-dehydration, on ACE and Lasix.   Hold lisinopril rehydrate and continue to follow RENAL US showing no evidence of hydronephrosis 2/15: Creatinine continues to improve.  1.14 today Continue to monitor  DM2--controlled A1c 5.6 TSH 5.4, will need recheck in 4 weeks when acute phase illness improves   DVT prophylaxis: SCD Code Status: Full Family Communication: [name] [phone] on 07/28/2020 Disposition Plan: Status is: Inpatient  Remains inpatient appropriate because:Inpatient level of care appropriate due to severity of illness   Dispo: The patient is from: Home              Anticipated d/c is to: SNF  Anticipated d/c date is: > 3 days              Patient currently is not medically stable to d/c.   Difficult to place patient No  Patient's  oral intake remains quite poor. Mental status waxes and wanes. P.o. intake slowly picking up. Will need skilled nursing facility at time of discharge.  Will need to improve PO intake prior to any discharge plan.  Postoperative day #6.    Level of care: Med-Surg  Consultants:   General surgery  Palliative care  Procedures:   Surgical debridement of sacral decubitus ulcer, 07/22/2020  Antimicrobials:   Unasyn   Subjective: Patient seen and examined.  Confused this morning but overall calm.  Objective: Vitals:   07/27/20 1614 07/27/20 1955 07/27/20 2326 07/28/20 0438  BP: (!) 164/103 (!) 148/65 (!) 154/88 127/65  Pulse: 98 83 68 66  Resp: 14 20 18 20   Temp: (!) 97.4 F (36.3 C) 98.6 F (37 C) (!) 97.4 F (36.3 C) 98.6 F (37 C)  TempSrc:  Oral Oral Oral  SpO2: 97% 96% 96% 96%  Weight:      Height:        Intake/Output Summary (Last 24 hours) at 07/28/2020 1132 Last data filed at 07/28/2020 0806 Gross per 24 hour  Intake 609.87 ml  Output 700 ml  Net -90.13 ml   Filed Weights   07/18/20 1603  Weight: 98 kg    Examination:  General exam: No acute distress.  Confused.  Frail Respiratory system: Bibasilar crackles.  Normal work of breathing.  Room air Cardiovascular system: S1-S2, regular rate and rhythm,+murmur Gastrointestinal system: Soft, nontender, nondistended, normal bowel sounds Central nervous system: Alert, confused, oriented x1, no focal deficits Extremities: Symmetric 5 x 5 power. Skin: Sacral decubitus status post debridement.  Wound VAC in place Psychiatry: Judgement and insight appear impaired. Mood & affect flattened.     Data Reviewed: I have personally reviewed following labs and imaging studies  CBC: Recent Labs  Lab 07/24/20 0707 07/25/20 0425 07/26/20 0438 07/27/20 0602 07/28/20 0411  WBC 10.9* 9.2 12.0* 9.8 10.4  NEUTROABS 8.9* 6.7 7.3 6.8 7.3  HGB 10.8* 9.0* 11.0* 10.1* 9.6*  HCT 35.4* 29.6* 36.1 32.4* 30.9*  MCV 88.5 89.4  88.5 87.1 86.6  PLT 320 283 374 306 527   Basic Metabolic Panel: Recent Labs  Lab 07/23/20 0402 07/24/20 0707 07/25/20 0425 07/26/20 0438 07/27/20 0602 07/28/20 0411  NA 147* 147* 145 143  --  145  K 4.2 3.8 3.1* 3.8  --  2.9*  CL 107 106 107 104  --  107  CO2 23 26 29 26   --  25  GLUCOSE 133* 121* 112* 96  --  98  BUN 63* 61* 50* 41*  --  28*  CREATININE 1.76* 1.50* 1.18* 1.18* 1.11* 1.14*  CALCIUM 9.8 10.1 9.2 9.8  --  9.0  MG 1.8 1.8 1.5* 2.2 1.8 1.8   GFR: Estimated Creatinine Clearance: 39.5 mL/min (A) (by C-G formula based on SCr of 1.14 mg/dL (H)). Liver Function Tests: No results for input(s): AST, ALT, ALKPHOS, BILITOT, PROT, ALBUMIN in the last 168 hours. No results for input(s): LIPASE, AMYLASE in the last 168 hours. No results for input(s): AMMONIA in the last 168 hours. Coagulation Profile: No results for input(s): INR, PROTIME in the last 168 hours. Cardiac Enzymes: No results for input(s): CKTOTAL, CKMB, CKMBINDEX, TROPONINI in the last 168 hours. BNP (last 3 results) No results for input(s):  PROBNP in the last 8760 hours. HbA1C: No results for input(s): HGBA1C in the last 72 hours. CBG: Recent Labs  Lab 07/27/20 1609 07/27/20 1929 07/27/20 2328 07/28/20 0430 07/28/20 0749  GLUCAP 98 112* 117* 89 100*   Lipid Profile: No results for input(s): CHOL, HDL, LDLCALC, TRIG, CHOLHDL, LDLDIRECT in the last 72 hours. Thyroid Function Tests: No results for input(s): TSH, T4TOTAL, FREET4, T3FREE, THYROIDAB in the last 72 hours. Anemia Panel: No results for input(s): VITAMINB12, FOLATE, FERRITIN, TIBC, IRON, RETICCTPCT in the last 72 hours. Sepsis Labs: No results for input(s): PROCALCITON, LATICACIDVEN in the last 168 hours.  Recent Results (from the past 240 hour(s))  Blood Culture (routine x 2)     Status: None   Collection Time: 07/18/20  4:30 PM   Specimen: BLOOD  Result Value Ref Range Status   Specimen Description BLOOD BLOOD RIGHT FOREARM  Final    Special Requests   Final    BOTTLES DRAWN AEROBIC AND ANAEROBIC Blood Culture adequate volume   Culture   Final    NO GROWTH 5 DAYS Performed at [facility], [address], [place], [place] [address]    Report Status 07/23/2020 FINAL  Final  SARS Coronavirus 2 by RT PCR (hospital order, performed in [facility] hospital lab) Nasopharyngeal Nasopharyngeal Swab     Status: None   Collection Time: 07/18/20  5:39 PM   Specimen: Nasopharyngeal Swab  Result Value Ref Range Status   SARS Coronavirus 2 NEGATIVE NEGATIVE Final    Comment: (NOTE) SARS-CoV-2 target nucleic acids are NOT DETECTED.  The SARS-CoV-2 RNA is generally detectable in upper and lower respiratory specimens during the acute phase of infection. The lowest concentration of SARS-CoV-2 viral copies this assay can detect is 250 copies / mL. A negative result does not preclude SARS-CoV-2 infection and should not be used as the sole basis for treatment or other patient management decisions.  A negative result may occur with improper specimen collection / handling, submission of specimen other than nasopharyngeal swab, presence of viral mutation(s) within the areas targeted by this assay, and inadequate number of viral copies (<250 copies / mL). A negative result must be combined with clinical observations, patient history, and epidemiological information.  Fact Sheet for Patients:   StrictlyIdeas.no  Fact Sheet for Healthcare Providers: BankingDealers.co.za  This test is not yet approved or  cleared by the [place] FDA and has been authorized for detection and/or diagnosis of SARS-CoV-2 by FDA under an Emergency Use Authorization (EUA).  This EUA will remain in effect (meaning this test can be used) for the duration of the COVID-19 declaration under Section 564(b)(1) of the Act, 21 U.S.C. section 360bbb-3(b)(1), unless the authorization is terminated  or revoked sooner.  Performed at [facility], [address] [place], [place] [address]   Blood Culture (routine x 2)     Status: None   Collection Time: 07/18/20  6:40 PM   Specimen: BLOOD  Result Value Ref Range Status   Specimen Description BLOOD RIGHT ANTECUBITAL  Final   Special Requests   Final    BOTTLES DRAWN AEROBIC AND ANAEROBIC Blood Culture adequate volume   Culture   Final    NO GROWTH 5 DAYS Performed at [facility], [address] [place], [place] [address]    Report Status 07/23/2020 FINAL  Final  Urine culture     Status: Abnormal   Collection Time: 07/18/20  8:26 PM   Specimen: Urine, Random  Result  Value Ref Range Status   Specimen Description   Final    URINE, RANDOM Performed at [name], [address] [place], [place] [address]    Special Requests   Final    NONE Performed at [facility], [address] [place], [place] [address]    Culture (A)  Final    >=100,000 COLONIES/mL ENTEROCOCCUS FAECALIS VANCOMYCIN RESISTANT ENTEROCOCCUS ISOLATED    Report Status 07/21/2020 FINAL  Final   Organism ID, Bacteria ENTEROCOCCUS FAECALIS (A)  Final      Susceptibility   Enterococcus faecalis - MIC*    AMPICILLIN <=2 SENSITIVE Sensitive     NITROFURANTOIN <=16 SENSITIVE Sensitive     VANCOMYCIN >=32 RESISTANT Resistant     LINEZOLID 2 SENSITIVE Sensitive     * >=100,000 COLONIES/mL ENTEROCOCCUS FAECALIS  MRSA PCR Screening     Status: Abnormal   Collection Time: 07/19/20  3:10 AM   Specimen: Nasal Mucosa; Nasopharyngeal  Result Value Ref Range Status   MRSA by PCR POSITIVE (A) NEGATIVE Final    Comment:        The GeneXpert MRSA Assay (FDA approved for NASAL specimens only), is one component of a comprehensive MRSA colonization surveillance program. It is not intended to diagnose MRSA infection nor to guide or monitor treatment for MRSA infections. RESULT CALLED TO, READ BACK BY AND VERIFIED  WITH: STACY CLAY AT 0609 ON 07/19/2020 [place]. Performed at [facility], [address] [place], [place] [address]   Aerobic/Anaerobic Culture (surgical/deep wound)     Status: None (Preliminary result)   Collection Time: 07/22/20  3:00 PM   Specimen: PATH Other; Tissue  Result Value Ref Range Status   Specimen Description   Final    WOUND Performed at [facility], [address] [place], [place] [address]    Special Requests BONE COCCYX  Final   Gram Stain NO WBC SEEN NO ORGANISMS SEEN   Final   Culture   Final    NO GROWTH 4 DAYS NO ANAEROBES ISOLATED; CULTURE IN PROGRESS FOR 5 DAYS Performed at [facility] [address], [place], [place] [address]    Report Status PENDING  Incomplete  Aerobic/Anaerobic Culture (surgical/deep wound)     Status: None   Collection Time: 07/22/20  3:04 PM   Specimen: PATH Other; Tissue  Result Value Ref Range Status   Specimen Description   Final    WOUND Performed at [facility], [address] [place], [place] [address]    Special Requests FECAL DECUBITIS  Final   Gram Stain NO WBC SEEN NO ORGANISMS SEEN   Final   Culture   Final    RARE METHICILLIN RESISTANT STAPHYLOCOCCUS AUREUS RARE VANCOMYCIN RESISTANT ENTEROCOCCUS ISOLATED RARE BACTEROIDES THETAIOTAOMICRON BETA LACTAMASE POSITIVE Performed at [facility], [address], [place], [place] [address]    Report Status 07/27/2020 FINAL  Final   Organism ID, Bacteria METHICILLIN RESISTANT STAPHYLOCOCCUS AUREUS  Final   Organism ID, Bacteria VANCOMYCIN RESISTANT ENTEROCOCCUS ISOLATED  Final      Susceptibility   Methicillin resistant staphylococcus aureus - MIC*    CIPROFLOXACIN >=8 RESISTANT Resistant     ERYTHROMYCIN >=8 RESISTANT Resistant     GENTAMICIN <=0.5 SENSITIVE Sensitive     OXACILLIN >=4 RESISTANT Resistant     TETRACYCLINE <=1 SENSITIVE Sensitive     VANCOMYCIN 1 SENSITIVE Sensitive     TRIMETH/SULFA >=320 RESISTANT  Resistant     CLINDAMYCIN <=0.25  SENSITIVE Sensitive     RIFAMPIN <=0.5 SENSITIVE Sensitive     Inducible Clindamycin NEGATIVE Sensitive     * RARE METHICILLIN RESISTANT STAPHYLOCOCCUS AUREUS   Vancomycin resistant enterococcus isolated - MIC*    AMPICILLIN <=2 SENSITIVE Sensitive     VANCOMYCIN >=32 RESISTANT Resistant     GENTAMICIN SYNERGY SENSITIVE Sensitive     LINEZOLID 2 SENSITIVE Sensitive     * RARE VANCOMYCIN RESISTANT ENTEROCOCCUS ISOLATED         Radiology Studies: DG Wrist 2 Views Right  Result Date: 07/28/2020 CLINICAL DATA:  Right wrist pain without known injury. EXAM: RIGHT WRIST - 2 VIEW COMPARISON:  None. FINDINGS: There is no evidence of fracture or dislocation. Severe degenerative changes seen involving the first carpometacarpal joint. Vascular calcifications are noted. IMPRESSION: Severe osteoarthritis of the first carpometacarpal joint. No acute abnormality seen in the right wrist. Electronically Signed   By: Marijo Conception M.D.   On: 07/28/2020 10:28        Scheduled Meds: . allopurinol  100 mg Oral Daily  . amLODipine  5 mg Oral Daily  . vitamin C  250 mg Oral BID  . Chlorhexidine Gluconate Cloth  6 each Topical Daily  . feeding supplement  237 mL Oral TID BM  . insulin aspart  0-6 Units Subcutaneous Q4H  . mirtazapine  15 mg Oral QHS  . multivitamin with minerals  1 tablet Oral Daily   Continuous Infusions: . ampicillin-sulbactam (UNASYN) IV 3 g (07/28/20 0512)  . potassium chloride    . vancomycin Stopped (07/27/20 1714)     LOS: 10 days    Time spent: 25 minutes    Sidney Ace, MD [facility] Pager [phone]  If 7PM-7AM, please contact night-coverage 07/28/2020, 11:32 AM

## 2020-07-28 NOTE — Progress Notes (Signed)
Spoke to son [name] who is at bedside. Concerned about patient's poor intake. Asked about a medical stimulant which he presumes patient is on and I told him she was only on Ensure.  Patient took sips of ensure. Also asked about CT of right wrist, did not see any results in system.  Slept well through the night, no complaints of pain. Said son [name] would like to get an update from doctor.

## 2020-07-28 NOTE — Plan of Care (Signed)
Potassium lab is low and needing replacement.  Dr. Georgena Spurling orders for IV replacements since not taking PO at this time.  IV team asked to get midline to assist with administration - but was unable to find suitable vein.  Unfortunately, kidney function is not good, but will still need to dilute potassium with fluids to assist with tolerance into PIV.  Dr. Darlyn Chamber of situation.

## 2020-07-28 NOTE — Progress Notes (Signed)
Patient refused to eat breakfast.

## 2020-07-29 ENCOUNTER — Encounter: Payer: Self-pay | Admitting: Internal Medicine

## 2020-07-29 DIAGNOSIS — E860 Dehydration: Secondary | ICD-10-CM | POA: Diagnosis not present

## 2020-07-29 DIAGNOSIS — N179 Acute kidney failure, unspecified: Secondary | ICD-10-CM | POA: Diagnosis not present

## 2020-07-29 DIAGNOSIS — G9341 Metabolic encephalopathy: Secondary | ICD-10-CM | POA: Diagnosis not present

## 2020-07-29 DIAGNOSIS — I5032 Chronic diastolic (congestive) heart failure: Secondary | ICD-10-CM | POA: Diagnosis not present

## 2020-07-29 LAB — MAGNESIUM: Magnesium: 2.3 mg/dL (ref 1.7–2.4)

## 2020-07-29 LAB — BASIC METABOLIC PANEL
Anion gap: 8 (ref 5–15)
BUN: 24 mg/dL — ABNORMAL HIGH (ref 8–23)
CO2: 27 mmol/L (ref 22–32)
Calcium: 8.8 mg/dL — ABNORMAL LOW (ref 8.9–10.3)
Chloride: 109 mmol/L (ref 98–111)
Creatinine, Ser: 0.99 mg/dL (ref 0.44–1.00)
GFR, Estimated: 55 mL/min — ABNORMAL LOW (ref >60)
Glucose, Bld: 106 mg/dL — ABNORMAL HIGH (ref 70–99)
Potassium: 3.1 mmol/L — ABNORMAL LOW (ref 3.5–5.1)
Sodium: 144 mmol/L (ref 135–145)

## 2020-07-29 LAB — GLUCOSE, CAPILLARY
Glucose-Capillary: 102 mg/dL — ABNORMAL HIGH (ref 70–99)
Glucose-Capillary: 109 mg/dL — ABNORMAL HIGH (ref 70–99)
Glucose-Capillary: 77 mg/dL (ref 70–99)
Glucose-Capillary: 82 mg/dL (ref 70–99)
Glucose-Capillary: 90 mg/dL (ref 70–99)
Glucose-Capillary: 91 mg/dL (ref 70–99)

## 2020-07-29 MED ORDER — POTASSIUM CHLORIDE 10 MEQ/100ML IV SOLN
10.0000 meq | INTRAVENOUS | Status: AC
  Administered 2020-07-29 (×2): 10 meq via INTRAVENOUS
  Filled 2020-07-29 (×2): qty 100

## 2020-07-29 MED ORDER — POTASSIUM CHLORIDE 10 MEQ/100ML IV SOLN
10.0000 meq | INTRAVENOUS | Status: DC
  Administered 2020-07-29 (×2): 10 meq via INTRAVENOUS
  Filled 2020-07-29 (×2): qty 100

## 2020-07-29 NOTE — Progress Notes (Signed)
Progress Note    [name]  BZJ:[id number] DOB: [birth year]  DOA: 07/18/2020 PCP: Biagio Borg, MD      Brief Narrative:    Medical records reviewed and are as summarized below:  [name] is a 87 y.o. female       Assessment/Plan:   Active Problems:   Diabetes ([place])   Hyperlipidemia   Essential hypertension   Malignant neoplasm of upper-outer quadrant of right breast in female, estrogen receptor positive ([place])   Wound healing, delayed   AKI (acute kidney injury) ([place])   CKD (chronic kidney disease) stage 3, GFR 30-59 ml/min ([name])   Chronic diastolic CHF (congestive heart failure) ([place])   Protein-calorie malnutrition, severe ([name])   Dehydration   Hyperkalemia   CAP (community acquired pneumonia)   Acute metabolic encephalopathy   Sacral decubitus ulcer, stage IV ([place])   Nutrition Problem: Increased nutrient needs Etiology: wound healing  Signs/Symptoms: estimated needs   Body mass index is 35.95 kg/m.  (Morbid obesity)   Acute metabolic encephalopathy Suspected hospital-acquired delirium No further inpatient intervention per neurosurgeon. Antipsychotics as needed for agitation.   Sacral decubitus ulcer Status post or debridement 07/22/2020 Wound VAC tubing has been disconnected on 07/29/2020 around 3 AM.  Wound VAC has been replaced by wound care nurse. General surgeon has signed off. Wound growing VRE and MRSA Patient is not a candidate for diverting colostomy per general surgeon. Continue empiric IV antibiotics. Analgesics as needed for pain  Protein-calorie malnutrition, severe  Patient has labile mental status with agitation and combativeness.  There is some concern that she will remove NG tube or PEG tube and so these are not feasible at the moment.  Feeding her has been an issue for nursing staff because of agitation and confusion.  She was started on Remeron on 07/24/2020.   Hypernatremia Likely due to poor oral intake.   Improving.  UTI +UA and ucx with Enter faeca.  Vancomycin-resistant Enterococcus isolated. ID consulted. Per ID Enterococcus is likely is contaminant Continue Unasyn per recommendations from ID Pneumonia unlikely at this time.   Acute intraventricular hemorrhage-Trace No further work-up from neurosurgeon.  Hypokalemia Replete potassium  AKI(acute kidney injury) ([place]) -appears to be prerenal-dehydration Lisinopril and Lasix have been held.   Other comorbidities include chronic diastolic CHF, hypertension, hyperlipidemia, type II DM (hemoglobin A1c 5.6)    Diet Order            DIET - DYS 1 Room service appropriate? No; Fluid consistency: Thin  Diet effective now                    Consultants:  General surgeon  Palliative care  Procedures:  Surgical debridement of sacral decubitus ulcer on 07/22/2020    Medications:   . allopurinol  100 mg Oral Daily  . amLODipine  5 mg Oral Daily  . vitamin C  250 mg Oral BID  . Chlorhexidine Gluconate Cloth  6 each Topical Daily  . feeding supplement  237 mL Oral TID BM  . insulin aspart  0-6 Units Subcutaneous Q4H  . mirtazapine  15 mg Oral QHS  . multivitamin with minerals  1 tablet Oral Daily   Continuous Infusions: . ampicillin-sulbactam (UNASYN) IV 3 g (07/29/20 1148)  . vancomycin 150 mL/hr at 07/29/20 0422     Anti-infectives (From admission, onward)   Start     Dose/Rate Route Frequency Ordered Stop   07/27/20 1800  vancomycin (VANCOREADY) IVPB 750  mg/150 mL        750 mg 150 mL/hr over 60 Minutes Intravenous Every 24 hours 07/26/20 1500     07/26/20 1800  Ampicillin-Sulbactam (UNASYN) 3 g in sodium chloride 0.9 % 100 mL IVPB        3 g 200 mL/hr over 30 Minutes Intravenous Every 6 hours 07/26/20 1522     07/26/20 1700  vancomycin (VANCOREADY) IVPB 2000 mg/400 mL        2,000 mg 200 mL/hr over 120 Minutes Intravenous  Once 07/26/20 1500 07/26/20 2020   07/26/20 1600  clindamycin (CLEOCIN) IVPB  600 mg  Status:  Discontinued        600 mg 100 mL/hr over 30 Minutes Intravenous Every 8 hours 07/26/20 1500 07/26/20 1522   07/25/20 1200  Ampicillin-Sulbactam (UNASYN) 3 g in sodium chloride 0.9 % 100 mL IVPB  Status:  Discontinued        3 g 200 mL/hr over 30 Minutes Intravenous Every 6 hours 07/25/20 0725 07/26/20 1500   07/21/20 1400  Ampicillin-Sulbactam (UNASYN) 3 g in sodium chloride 0.9 % 100 mL IVPB  Status:  Discontinued        3 g 200 mL/hr over 30 Minutes Intravenous Every 12 hours 07/21/20 1308 07/25/20 0725   07/19/20 0600  cefTRIAXone (ROCEPHIN) 2 g in sodium chloride 0.9 % 100 mL IVPB  Status:  Discontinued        2 g 200 mL/hr over 30 Minutes Intravenous Every 24 hours 07/18/20 2356 07/21/20 1306   07/19/20 0000  azithromycin (ZITHROMAX) 500 mg in sodium chloride 0.9 % 250 mL IVPB  Status:  Discontinued        500 mg 250 mL/hr over 60 Minutes Intravenous Every 24 hours 07/18/20 2356 07/21/20 1306   07/18/20 1900  vancomycin (VANCOCIN) IVPB 1000 mg/200 mL premix        1,000 mg 200 mL/hr over 60 Minutes Intravenous  Once 07/18/20 1849 07/18/20 2254   07/18/20 1900  ceFEPIme (MAXIPIME) 2 g in sodium chloride 0.9 % 100 mL IVPB        2 g 200 mL/hr over 30 Minutes Intravenous  Once 07/18/20 1849 07/18/20 2104             Family Communication/Anticipated D/C date and plan/Code Status   DVT prophylaxis: SCDs Start: 07/19/20 1638 SCDs Start: 07/18/20 2349     Code Status: DNR  Family Communication: None Disposition Plan:    Status is: Inpatient  Remains inpatient appropriate because:IV treatments appropriate due to intensity of illness or inability to take PO   Dispo: The patient is from: Home              Anticipated d/c is to: SNF              Anticipated d/c date is: 3 days              Patient currently is not medically stable to d/c.   Difficult to place patient No           Subjective:   Interval events noted.  Her nurse was at the  bedside.  Objective:    Vitals:   07/29/20 0705 07/29/20 0819 07/29/20 1000 07/29/20 1150  BP: (!) 167/76 (!) 194/85 116/77 (!) 156/47  Pulse: 78 83  97  Resp: 20 19  16   Temp:  98 F (36.7 C)  97.8 F (36.6 C)  TempSrc:  Axillary  Axillary  SpO2:    99%  Weight:      Height:       No data found.   Intake/Output Summary (Last 24 hours) at 07/29/2020 1431 Last data filed at 07/29/2020 1304 Gross per 24 hour  Intake 914.87 ml  Output 2020 ml  Net -1105.13 ml   Filed Weights   07/18/20 1603  Weight: 98 kg    Exam:  GEN: NAD SKIN: Stage IV sacral pressure injury EYES: No pallor or icterus ENT: MMM CV: RRR PULM: CTA B ABD: soft, ND, NT, +BS CNS: Alert but confused EXT: No edema or tenderness        Pressure Injury 07/19/20 Sacrum Medial Unstageable - Full thickness tissue loss in which the base of the injury is covered by slough (yellow, tan, gray, green or brown) and/or eschar (tan, brown or black) in the wound bed. small amount of tunneling with  (Active)  07/19/20 0211  Location: Sacrum  Location Orientation: Medial  Staging: Unstageable - Full thickness tissue loss in which the base of the injury is covered by slough (yellow, tan, gray, green or brown) and/or eschar (tan, brown or black) in the wound bed.  Wound Description (Comments): small amount of tunneling with eschar  Present on Admission: Yes     Pressure Injury 07/19/20 Heel Left Unstageable - Full thickness tissue loss in which the base of the injury is covered by slough (yellow, tan, gray, green or brown) and/or eschar (tan, brown or black) in the wound bed. eschar around edge of injury (Active)  07/19/20 0214  Location: Heel  Location Orientation: Left  Staging: Unstageable - Full thickness tissue loss in which the base of the injury is covered by slough (yellow, tan, gray, green or brown) and/or eschar (tan, brown or black) in the wound bed.  Wound Description (Comments): eschar around edge of  injury  Present on Admission: Yes     Data Reviewed:   I have personally reviewed following labs and imaging studies:  Labs: Labs show the following:   Basic Metabolic Panel: Recent Labs  Lab 07/24/20 0707 07/25/20 0425 07/26/20 0438 07/27/20 0602 07/28/20 0411 07/29/20 0456  NA 147* 145 143  --  145 144  K 3.8 3.1* 3.8  --  2.9* 3.1*  CL 106 107 104  --  107 109  CO2 26 29 26   --  25 27  GLUCOSE 121* 112* 96  --  98 106*  BUN 61* 50* 41*  --  28* 24*  CREATININE 1.50* 1.18* 1.18* 1.11* 1.14* 0.99  CALCIUM 10.1 9.2 9.8  --  9.0 8.8*  MG 1.8 1.5* 2.2 1.8 1.8 2.3   GFR Estimated Creatinine Clearance: 45.5 mL/min (by C-G formula based on SCr of 0.99 mg/dL). Liver Function Tests: No results for input(s): AST, ALT, ALKPHOS, BILITOT, PROT, ALBUMIN in the last 168 hours. No results for input(s): LIPASE, AMYLASE in the last 168 hours. No results for input(s): AMMONIA in the last 168 hours. Coagulation profile No results for input(s): INR, PROTIME in the last 168 hours.  CBC: Recent Labs  Lab 07/24/20 0707 07/25/20 0425 07/26/20 0438 07/27/20 0602 07/28/20 0411  WBC 10.9* 9.2 12.0* 9.8 10.4  NEUTROABS 8.9* 6.7 7.3 6.8 7.3  HGB 10.8* 9.0* 11.0* 10.1* 9.6*  HCT 35.4* 29.6* 36.1 32.4* 30.9*  MCV 88.5 89.4 88.5 87.1 86.6  PLT 320 283 374 306 325   Cardiac Enzymes: No results for input(s): CKTOTAL, CKMB, CKMBINDEX, TROPONINI in the last 168 hours. BNP (last 3 results) No results for  input(s): PROBNP in the last 8760 hours. CBG: Recent Labs  Lab 07/28/20 2025 07/29/20 0027 07/29/20 0351 07/29/20 0816 07/29/20 1206  GLUCAP 106* 109* 102* 91 90   D-Dimer: No results for input(s): DDIMER in the last 72 hours. Hgb A1c: No results for input(s): HGBA1C in the last 72 hours. Lipid Profile: No results for input(s): CHOL, HDL, LDLCALC, TRIG, CHOLHDL, LDLDIRECT in the last 72 hours. Thyroid function studies: No results for input(s): TSH, T4TOTAL, T3FREE, THYROIDAB  in the last 72 hours.  Invalid input(s): FREET3 Anemia work up: No results for input(s): VITAMINB12, FOLATE, FERRITIN, TIBC, IRON, RETICCTPCT in the last 72 hours. Sepsis Labs: Recent Labs  Lab 07/25/20 0425 07/26/20 0438 07/27/20 0602 07/28/20 0411  WBC 9.2 12.0* 9.8 10.4    Microbiology Recent Results (from the past 240 hour(s))  Aerobic/Anaerobic Culture (surgical/deep wound)     Status: None   Collection Time: 07/22/20  3:00 PM   Specimen: PATH Other; Tissue  Result Value Ref Range Status   Specimen Description   Final    WOUND Performed at [facility], [address] [place], [place] [address]    Special Requests BONE COCCYX  Final   Gram Stain NO WBC SEEN NO ORGANISMS SEEN   Final   Culture   Final    No growth aerobically or anaerobically. Performed at [facility], [place] [address], [place], [place] [address]    Report Status 07/28/2020 FINAL  Final  Aerobic/Anaerobic Culture (surgical/deep wound)     Status: None   Collection Time: 07/22/20  3:04 PM   Specimen: PATH Other; Tissue  Result Value Ref Range Status   Specimen Description   Final    WOUND Performed at [facility], [address] [place], [place] [address]    Special Requests FECAL DECUBITIS  Final   Gram Stain NO WBC SEEN NO ORGANISMS SEEN   Final   Culture   Final    RARE METHICILLIN RESISTANT STAPHYLOCOCCUS AUREUS RARE VANCOMYCIN RESISTANT ENTEROCOCCUS ISOLATED RARE BACTEROIDES THETAIOTAOMICRON BETA LACTAMASE POSITIVE Performed at [facility], [place] [address], [place], [place] [address]    Report Status 07/27/2020 FINAL  Final   Organism ID, Bacteria METHICILLIN RESISTANT STAPHYLOCOCCUS AUREUS  Final   Organism ID, Bacteria VANCOMYCIN RESISTANT ENTEROCOCCUS ISOLATED  Final      Susceptibility   Methicillin resistant staphylococcus aureus - MIC*    CIPROFLOXACIN >=8 RESISTANT Resistant     ERYTHROMYCIN >=8 RESISTANT Resistant     GENTAMICIN <=0.5  SENSITIVE Sensitive     OXACILLIN >=4 RESISTANT Resistant     TETRACYCLINE <=1 SENSITIVE Sensitive     VANCOMYCIN 1 SENSITIVE Sensitive     TRIMETH/SULFA >=320 RESISTANT Resistant     CLINDAMYCIN <=0.25 SENSITIVE Sensitive     RIFAMPIN <=0.5 SENSITIVE Sensitive     Inducible Clindamycin NEGATIVE Sensitive     * RARE METHICILLIN RESISTANT STAPHYLOCOCCUS AUREUS   Vancomycin resistant enterococcus isolated - MIC*    AMPICILLIN <=2 SENSITIVE Sensitive     VANCOMYCIN >=32 RESISTANT Resistant     GENTAMICIN SYNERGY SENSITIVE Sensitive     LINEZOLID 2 SENSITIVE Sensitive     * RARE VANCOMYCIN RESISTANT ENTEROCOCCUS ISOLATED    Procedures and diagnostic studies:  DG Wrist 2 Views Right  Result Date: 07/28/2020 CLINICAL DATA:  Right wrist pain without known injury. EXAM: RIGHT WRIST - 2 VIEW COMPARISON:  None. FINDINGS: There is no evidence of fracture or dislocation. Severe degenerative changes seen involving  the first carpometacarpal joint. Vascular calcifications are noted. IMPRESSION: Severe osteoarthritis of the first carpometacarpal joint. No acute abnormality seen in the right wrist. Electronically Signed   By: Marijo Conception M.D.   On: 07/28/2020 10:28               LOS: 11 days   Shabana Armentrout  [facility]   Pager on [email or web address]. If 7PM-7AM, please contact night-coverage at [email or web address]     07/29/2020, 2:31 PM

## 2020-07-29 NOTE — Progress Notes (Signed)
Pharmacy Antibiotic Note  [name] is a 87 y.o. female with medical history significant of dementia, diastolic CHF s/p lumbar fusion due to L3 frx and repair of traumatic CSF leak Oct 2021, anemia, atrial flutter, breast cancer s/p radiation therapy, CKD stage III, CVA, history of DVT left leg postoperatively, esophageal stricture, GERD,  C. difficile infection, left foot pressure ulcer, severe osteopenia, falls with a Stage III sacral decubitus ulcer with a wound VAC. Pharmacy has been consulted for vancomycin dosing after her wound grew out MRSA and VRE.  Today, 07/29/2020 Day #9 amp/sulb Day #4 vancomycin  SCr improved  WBC WNL  Afebrile  2/9 wound cx: MRSA, VRE (amp susc), and bacteroides  Plan:  Continue 750 mg IV every 24 hours  Goal AUC 400-550  Expected AUC: 242  SCr used: 0.99. Vd = 0.5 L/kg  Check SCr at least q48h  Levels as clinically indicated - Hold off for now as anticipate change to PO doxycycline and amoxicillin/clavulonate as per ID rec (plan 14 days)  Height: 5\' 5"  (165.1 cm) Weight: 98 kg (216 lb 0.8 oz) IBW/kg (Calculated) : 57  Temp (24hrs), Avg:98.3 F (36.8 C), Min:98 F (36.7 C), Max:98.5 F (36.9 C)  Recent Labs  Lab 07/24/20 0707 07/25/20 0425 07/26/20 0438 07/27/20 0602 07/28/20 0411 07/29/20 0456  WBC 10.9* 9.2 12.0* 9.8 10.4  --   CREATININE 1.50* 1.18* 1.18* 1.11* 1.14* 0.99    Estimated Creatinine Clearance: 45.5 mL/min (by C-G formula based on SCr of 0.99 mg/dL).    Allergies  Allergen Reactions  . Ace Inhibitors Other (See Comments)    Tolerates lisinopril at home. Pt does not recall allergy.  . Atorvastatin     REACTION: myalgias  . Oxycodone-Acetaminophen     REACTION: confusion, fatigue  . Rosuvastatin     REACTION: leg weakness  . Simvastatin     REACTION: leg weakness  . Codeine Nausea And Vomiting and Rash  . Sulfonamide Derivatives Hives and Rash    Antimicrobials this admission: ceftriaxone 2/6 >>  2/8 azithromycin 2/6 >> 2/8 Unasyn 2/8 >>  vancomycin 2/13 >>   Microbiology results: 2/5 BCx: NG final 2/5 UCx: VRE  2/9 WCx: MRSA, VRE, bacteroides 2/9 WCx: NG x 3 days 2/6 MRSA PCR: positive 2/5 SARS CoV-2: negative  Thank you for allowing pharmacy to be a part of this patient's care.  Doreene Eland, PharmD, BCPS.   Work Cell: [phone] 07/29/2020 10:44 AM

## 2020-07-29 NOTE — Progress Notes (Signed)
Wound vac tubing found disconnected around 3 am, suspect patient pulled it. Was on when bath was given earlier on shift. Telesitter did not call. Attempted to put mitts on but patient agitated and pulling away, being combative, gave haldol 2 mg IM at 341, calmed down and fell asleep.

## 2020-07-29 NOTE — Care Plan (Signed)
Pt being combative, agitated, yelling, spitting out food, clothing mouth, hitting staff at this time. Attempted to feed patient and it was not successful. Did not want liquids either, pills not given. BP elevated. Hydralalzine given at this time per order.

## 2020-07-29 NOTE — Consult Note (Signed)
[facility] Nurse wound follow up Wound type: Routine NPWT dressing change over Stage 4 sacral pressure injury. Patient became combative and agitated last night and partially removed her dressing.  Patient is calm initially, but becomes agitated during dressing change.  3 staff members in ad\dition to this writer are needed to perform care which included the dressing change and a simultaneous bath. Measurement: Sacral wound: 7.5cm x 17cm x 1.6cm Wound GYI:RSWNIO Wound: 50% lighter red, 30% bone and 20% darker red. Drainage (amount, consistency, odor) Small serous Periwound: Blanching area of erythema persists from 8-9 o'clock. Small areas of full thickness skin loss at 5 and 7 o'clock whose depth is obscured due to the presence of fibrinous yellow slough.  There is perianal irritant contact dermatitis that is likely to interfere with the NPWT dressing seal despite efforts to use an ostomy skin barrier ring to enhance the seal. Dressing procedure/placement/frequency:  Two pieces of black foam and the old NPWT dressing are removed from the wound and discarded. The wound and periwound skin are cleansed with NS and gently patted dry. A skin barrier ring is torn in half and used to protect the periwound skin and enhance the NPWT seal from 4-7 o'clock. The left hip is protected with drape.  The wound is filled with 1 piece of black foam and covered in drape. A single piece of foam is fashioned as a bridge and placed upon the wound dressing and periwound protective drape and then covered with additional drape. (2 pieces of black foam used today, total) The TRAC pad is affixed to the end of the foam bridge.  The dressing is attached to 133mmHg continuous negative pressure and an immediate seal is achieved. The tubing is labeled with today's date and the number of piece of foam beneath the dressing.  The next dressing change is due on Friday, 07/31/20. The supplies are in the room.  The patient continues on a mattress  replacement with low air loss feature.  She is being turned from side to side and cleansed when she is incontinent of stool, which appears to be liquid. There is an indwelling urinary catheter and perianal irritant contact dermatitis with partial thickness skin loss. She returns herself to the supine position. She is wearing bilateral pressure redistribution heel boots and the left heel Stage 3 pressure injury is improving and the current treatment plan is effective (white petrolatum gauze changed daily after soap and water cleanse, top with silicone foam and place foot into Prevalon Boot) remains appropriate.  Micro nursing team will follow, and will remain available to this patient, the nursing, surgical and medical teams. . Thanks, Maudie Flakes, MSN, RN, [place], Arther Abbott  Pager# [phone]

## 2020-07-29 NOTE — Progress Notes (Signed)
OT Cancellation Note  Patient Details Name: [name] MRN: [id number] DOB: 03/14/1930   Cancelled Treatment:    Reason Eval/Treat Not Completed: Patient declined, no reason specified. OT attempted to see pt this PM, with pt stating that she did not want therapy, no reason specified. When asked if OT could re-attempt tomorrow, pt stated "I'd prefer if you never come back". OT to attempt at later date as appropriate.   Fredirick Maudlin, OTR/L [place]

## 2020-07-29 NOTE — Care Plan (Signed)
Pt refusing to eat breakfast, lunch and dinner today. She has held her mouth shut and lips tight. Tried reorientation, decreased stimulation, offered several alternatives. Pt refuses to eat at this time.

## 2020-07-30 DIAGNOSIS — L89154 Pressure ulcer of sacral region, stage 4: Secondary | ICD-10-CM | POA: Diagnosis not present

## 2020-07-30 DIAGNOSIS — I5032 Chronic diastolic (congestive) heart failure: Secondary | ICD-10-CM | POA: Diagnosis not present

## 2020-07-30 DIAGNOSIS — E43 Unspecified severe protein-calorie malnutrition: Secondary | ICD-10-CM | POA: Diagnosis not present

## 2020-07-30 LAB — GLUCOSE, CAPILLARY
Glucose-Capillary: 101 mg/dL — ABNORMAL HIGH (ref 70–99)
Glucose-Capillary: 102 mg/dL — ABNORMAL HIGH (ref 70–99)
Glucose-Capillary: 123 mg/dL — ABNORMAL HIGH (ref 70–99)
Glucose-Capillary: 77 mg/dL (ref 70–99)
Glucose-Capillary: 77 mg/dL (ref 70–99)
Glucose-Capillary: 80 mg/dL (ref 70–99)
Glucose-Capillary: 84 mg/dL (ref 70–99)
Glucose-Capillary: 87 mg/dL (ref 70–99)

## 2020-07-30 LAB — POTASSIUM: Potassium: 3.7 mmol/L (ref 3.5–5.1)

## 2020-07-30 LAB — MAGNESIUM: Magnesium: 2 mg/dL (ref 1.7–2.4)

## 2020-07-30 LAB — PHOSPHORUS: Phosphorus: 2.4 mg/dL — ABNORMAL LOW (ref 2.5–4.6)

## 2020-07-30 LAB — CREATININE, SERUM
Creatinine, Ser: 1.05 mg/dL — ABNORMAL HIGH (ref 0.44–1.00)
GFR, Estimated: 51 mL/min — ABNORMAL LOW (ref >60)

## 2020-07-30 MED ORDER — SODIUM CHLORIDE 0.9 % IV SOLN
100.0000 mg | Freq: Two times a day (BID) | INTRAVENOUS | Status: DC
  Administered 2020-07-31 – 2020-08-04 (×10): 100 mg via INTRAVENOUS
  Filled 2020-07-30 (×12): qty 100

## 2020-07-30 MED ORDER — DEXTROSE 50 % IV SOLN
12.5000 g | Freq: Once | INTRAVENOUS | Status: AC
  Administered 2020-07-30: 12.5 g via INTRAVENOUS
  Filled 2020-07-30: qty 50

## 2020-07-30 MED ORDER — KCL IN DEXTROSE-NACL 20-5-0.9 MEQ/L-%-% IV SOLN
INTRAVENOUS | Status: DC
  Filled 2020-07-30 (×3): qty 1000

## 2020-07-30 NOTE — Care Plan (Signed)
Pt ate 8 spoons of food at this time and states I do not want anymore.

## 2020-07-30 NOTE — Progress Notes (Signed)
Nutrition Follow-up  DOCUMENTATION CODES:   Obesity unspecified  INTERVENTION:   Ensure Enlive po BID, each supplement provides 350 kcal and 20 grams of protein  Magic cup TID with meals, each supplement provides 290 kcal and 9 grams of protein  MVI daily  Vitamin C 257m po BID   Pt at high refeed risk; recommend monitor potassium, magnesium and phosphorus labs daily until stable  NUTRITION DIAGNOSIS:   Increased nutrient needs related to wound healing as evidenced by estimated needs.  GOAL:   Patient will meet greater than or equal to 90% of their needs  -not met   MONITOR:   PO intake,Supplement acceptance,Diet advancement,Labs,Weight trends,Skin,I & O's  ASSESSMENT:   87y.o. female with medical history significant of dementia, diastolic CHF   s/p Lumbar fusion due to L3 frx and repair of traumatic CSF leak Oct 2021, anemia, atrial flutter, breast cancer status post radiation therapy, CKD stage III, history of CVA, history of DVT left leg postoperatively, is a   esophageal stricture, GERD,  C. difficile infection, left foot pressure ulcer, severe osteopenia, falls   Admitted for AKI and dehydration, DM2  Pt continues to have poor appetite and oral intake in hospital. Pt is refusing most meals and supplements but will occasionally eat a few bites of food and drink a few sips of Ensure. Pt ate 8 spoonfuls of lunch today. Pt evaluated by SLP on 2/12 who recommended dysphagia 1/thin liquid diet. RD has spoken with MD regarding nasogastric tube placement and tube feeds. It is felt that with pt's AMS and agitation that she would just remove the tube. Pt is refeeding with IV dextrose. GOC being discussed with family.   No new weight since admit; will request weekly weights.   Medications reviewed and include: allopurinol, ascorbic acid, insulin, remeron, MVI, NaCl w/ 5% dextrose@50ml /hr, unasyn, vancomycin   Labs reviewed: K 3.7 wnl, creat 1.05(H), P 2.4(L), Mg 2.0 wnl Hgb  9.6(L), Hct 30.9(L)  Diet Order:   Diet Order            DIET - DYS 1 Room service appropriate? No; Fluid consistency: Thin  Diet effective now                EDUCATION NEEDS:   No education needs have been identified at this time  Skin:  Skin Assessment: Reviewed RN Assessment (Stage IV sacrum 7cm x 17cm x 1.5cm), VAC  Last BM:  2/16- type 7  Height:   Ht Readings from Last 1 Encounters:  07/18/20 5' 5"  (1.651 m)    Weight:   Wt Readings from Last 1 Encounters:  07/18/20 98 kg    Ideal Body Weight:  56.8 kg  BMI:  Body mass index is 35.95 kg/m.  Estimated Nutritional Needs:   Kcal:  1800-2000  Protein:  100-115 grams  Fluid:  > 1.8 L  CKoleen DistanceMS, RD, LDN Please refer to [facility]carefor RD and/or RD on-call/weekend/after hours pager

## 2020-07-30 NOTE — Progress Notes (Addendum)
Progress Note    [name]  ZSW:[id number] DOB: 02-May-1930  DOA: 07/18/2020 PCP: Biagio Borg, MD      Brief Narrative:    Medical records reviewed and are as summarized below:  [name] is a 87 y.o. female       Assessment/Plan:   Active Problems:   Diabetes ([place])   Hyperlipidemia   Essential hypertension   Malignant neoplasm of upper-outer quadrant of right breast in female, estrogen receptor positive ([place])   Wound healing, delayed   AKI (acute kidney injury) ([place])   CKD (chronic kidney disease) stage 3, GFR 30-59 ml/min ([name])   Chronic diastolic CHF (congestive heart failure) ([place])   Protein-calorie malnutrition, severe ([name])   Dehydration   Hyperkalemia   Acute metabolic encephalopathy   Sacral decubitus ulcer, stage IV ([place])   Nutrition Problem: Increased nutrient needs Etiology: wound healing  Signs/Symptoms: estimated needs   Body mass index is 35.95 kg/m.  (Morbid obesity)   Acute metabolic encephalopathy Suspected hospital-acquired delirium No further inpatient intervention per neurosurgeon. Antipsychotics as needed for agitation.   Sacral decubitus ulcer Status post debridement on 07/22/2020 Wound VAC tubing was disconnected on 07/29/2020 around 3 AM.   Wound VAC was replaced by wound care nurse on 07/29/2020. General surgeon has signed off. Wound growing VRE and MRSA Patient is not a candidate for diverting colostomy per general surgeon. Continue empiric IV antibiotics. Analgesics as needed for pain  Severe protein-calorie malnutrition, poor oral intake Patient has labile mental status with agitation and combativeness.  There is some concern that she will remove NG tube or PEG tube and so these are not feasible at the moment.  Feeding her has been an issue for nursing staff because of agitation and confusion.  She continues to refuse meals.  She was started on Remeron on 07/24/2020. Restart low rate IV fluids for hydration and  calories   Hypernatremia and hypokalemia Improved  UTI +UA and ucx with Enter faeca.  Vancomycin-resistant Enterococcus isolated. ID consulted. Per ID Enterococcus is likely is contaminant Continue Unasyn per recommendations from ID Pneumonia unlikely at this time.   Acute intraventricular hemorrhage-Trace No further work-up from neurosurgeon.  AKI(acute kidney injury) ([place]) -appears to be prerenal-dehydration Lisinopril and Lasix have been held.   Other comorbidities include chronic diastolic CHF, hypertension, hyperlipidemia, type II DM (hemoglobin A1c 5.6)  Plan of care was discussed with her son, [name].  Goals of care were discussed.  He said they were not ready as a family to proceed with comfort care.  He was asked to continue current treatment and they will make a decision in the near future depending on how she does.    Diet Order            DIET - DYS 1 Room service appropriate? No; Fluid consistency: Thin  Diet effective now                    Consultants:  General surgeon  Palliative care  Procedures:  Surgical debridement of sacral decubitus ulcer on 07/22/2020    Medications:   . allopurinol  100 mg Oral Daily  . amLODipine  5 mg Oral Daily  . vitamin C  250 mg Oral BID  . Chlorhexidine Gluconate Cloth  6 each Topical Daily  . feeding supplement  237 mL Oral TID BM  . insulin aspart  0-6 Units Subcutaneous Q4H  . mirtazapine  15 mg Oral QHS  .  multivitamin with minerals  1 tablet Oral Daily   Continuous Infusions: . ampicillin-sulbactam (UNASYN) IV 3 g (07/30/20 1154)  . vancomycin Stopped (07/29/20 1749)     Anti-infectives (From admission, onward)   Start     Dose/Rate Route Frequency Ordered Stop   07/27/20 1800  vancomycin (VANCOREADY) IVPB 750 mg/150 mL        750 mg 150 mL/hr over 60 Minutes Intravenous Every 24 hours 07/26/20 1500     07/26/20 1800  Ampicillin-Sulbactam (UNASYN) 3 g in sodium chloride 0.9 % 100 mL IVPB         3 g 200 mL/hr over 30 Minutes Intravenous Every 6 hours 07/26/20 1522     07/26/20 1700  vancomycin (VANCOREADY) IVPB 2000 mg/400 mL        2,000 mg 200 mL/hr over 120 Minutes Intravenous  Once 07/26/20 1500 07/26/20 2020   07/26/20 1600  clindamycin (CLEOCIN) IVPB 600 mg  Status:  Discontinued        600 mg 100 mL/hr over 30 Minutes Intravenous Every 8 hours 07/26/20 1500 07/26/20 1522   07/25/20 1200  Ampicillin-Sulbactam (UNASYN) 3 g in sodium chloride 0.9 % 100 mL IVPB  Status:  Discontinued        3 g 200 mL/hr over 30 Minutes Intravenous Every 6 hours 07/25/20 0725 07/26/20 1500   07/21/20 1400  Ampicillin-Sulbactam (UNASYN) 3 g in sodium chloride 0.9 % 100 mL IVPB  Status:  Discontinued        3 g 200 mL/hr over 30 Minutes Intravenous Every 12 hours 07/21/20 1308 07/25/20 0725   07/19/20 0600  cefTRIAXone (ROCEPHIN) 2 g in sodium chloride 0.9 % 100 mL IVPB  Status:  Discontinued        2 g 200 mL/hr over 30 Minutes Intravenous Every 24 hours 07/18/20 2356 07/21/20 1306   07/19/20 0000  azithromycin (ZITHROMAX) 500 mg in sodium chloride 0.9 % 250 mL IVPB  Status:  Discontinued        500 mg 250 mL/hr over 60 Minutes Intravenous Every 24 hours 07/18/20 2356 07/21/20 1306   07/18/20 1900  vancomycin (VANCOCIN) IVPB 1000 mg/200 mL premix        1,000 mg 200 mL/hr over 60 Minutes Intravenous  Once 07/18/20 1849 07/18/20 2254   07/18/20 1900  ceFEPIme (MAXIPIME) 2 g in sodium chloride 0.9 % 100 mL IVPB        2 g 200 mL/hr over 30 Minutes Intravenous  Once 07/18/20 1849 07/18/20 2104             Family Communication/Anticipated D/C date and plan/Code Status   DVT prophylaxis: SCDs Start: 07/19/20 1638 SCDs Start: 07/18/20 2349     Code Status: DNR  Family Communication: Discussed with her son, [name] Disposition Plan:    Status is: Inpatient  Remains inpatient appropriate because:IV treatments appropriate due to intensity of illness or inability to take  PO   Dispo: The patient is from: Home              Anticipated d/c is to: SNF              Anticipated d/c date is: 3 days              Patient currently is not medically stable to d/c.   Difficult to place patient No           Subjective:   Interval events noted.  Her nurse was at the bedside.  Objective:  Vitals:   07/29/20 2357 07/30/20 0328 07/30/20 0854 07/30/20 1129  BP: (!) 155/56 (!) 159/56 (!) 172/57 (!) 152/67  Pulse: 70 67 63 69  Resp: 18 20 20 18   Temp: 98 F (36.7 C) 97.8 F (36.6 C) 97.8 F (36.6 C) 98.1 F (36.7 C)  TempSrc: Tympanic Oral Oral Oral  SpO2: 96% 97% 97% 98%  Weight:      Height:       No data found.   Intake/Output Summary (Last 24 hours) at 07/30/2020 1156 Last data filed at 07/30/2020 0854 Gross per 24 hour  Intake 636.51 ml  Output 900 ml  Net -263.49 ml   Filed Weights   07/18/20 1603  Weight: 98 kg    Exam:  GEN: NAD SKIN: Stage IV sacral decubitus ulcer, unstageable left heel decubitus ulcer EYES: No pallor or icterus ENT: MMM CV: RRR PULM: CTA B ABD: soft, ND, NT, +BS CNS: Alert and oriented to person only.  She moves all extremities spontaneously EXT: No edema or tenderness         Pressure Injury 07/19/20 Sacrum Medial Unstageable - Full thickness tissue loss in which the base of the injury is covered by slough (yellow, tan, gray, green or brown) and/or eschar (tan, brown or black) in the wound bed. small amount of tunneling with  (Active)  07/19/20 0211  Location: Sacrum  Location Orientation: Medial  Staging: Unstageable - Full thickness tissue loss in which the base of the injury is covered by slough (yellow, tan, gray, green or brown) and/or eschar (tan, brown or black) in the wound bed.  Wound Description (Comments): small amount of tunneling with eschar  Present on Admission: Yes     Pressure Injury 07/19/20 Heel Left Unstageable - Full thickness tissue loss in which the base of the injury is  covered by slough (yellow, tan, gray, green or brown) and/or eschar (tan, brown or black) in the wound bed. eschar around edge of injury (Active)  07/19/20 0214  Location: Heel  Location Orientation: Left  Staging: Unstageable - Full thickness tissue loss in which the base of the injury is covered by slough (yellow, tan, gray, green or brown) and/or eschar (tan, brown or black) in the wound bed.  Wound Description (Comments): eschar around edge of injury  Present on Admission: Yes     Data Reviewed:   I have personally reviewed following labs and imaging studies:  Labs: Labs show the following:   Basic Metabolic Panel: Recent Labs  Lab 07/24/20 0707 07/25/20 0425 07/26/20 0438 07/27/20 0602 07/28/20 0411 07/29/20 0456 07/30/20 0358  NA 147* 145 143  --  145 144  --   K 3.8 3.1* 3.8  --  2.9* 3.1* 3.7  CL 106 107 104  --  107 109  --   CO2 26 29 26   --  25 27  --   GLUCOSE 121* 112* 96  --  98 106*  --   BUN 61* 50* 41*  --  28* 24*  --   CREATININE 1.50* 1.18* 1.18* 1.11* 1.14* 0.99 1.05*  CALCIUM 10.1 9.2 9.8  --  9.0 8.8*  --   MG 1.8 1.5* 2.2 1.8 1.8 2.3 2.0  PHOS  --   --   --   --   --   --  2.4*   GFR Estimated Creatinine Clearance: 42.9 mL/min (A) (by C-G formula based on SCr of 1.05 mg/dL (H)). Liver Function Tests: No results for input(s): AST, ALT,  ALKPHOS, BILITOT, PROT, ALBUMIN in the last 168 hours. No results for input(s): LIPASE, AMYLASE in the last 168 hours. No results for input(s): AMMONIA in the last 168 hours. Coagulation profile No results for input(s): INR, PROTIME in the last 168 hours.  CBC: Recent Labs  Lab 07/24/20 0707 07/25/20 0425 07/26/20 0438 07/27/20 0602 07/28/20 0411  WBC 10.9* 9.2 12.0* 9.8 10.4  NEUTROABS 8.9* 6.7 7.3 6.8 7.3  HGB 10.8* 9.0* 11.0* 10.1* 9.6*  HCT 35.4* 29.6* 36.1 32.4* 30.9*  MCV 88.5 89.4 88.5 87.1 86.6  PLT 320 283 374 306 325   Cardiac Enzymes: No results for input(s): CKTOTAL, CKMB, CKMBINDEX,  TROPONINI in the last 168 hours. BNP (last 3 results) No results for input(s): PROBNP in the last 8760 hours. CBG: Recent Labs  Lab 07/29/20 2353 07/30/20 0324 07/30/20 0632 07/30/20 0751 07/30/20 1126  GLUCAP 84 77 101* 87 80   D-Dimer: No results for input(s): DDIMER in the last 72 hours. Hgb A1c: No results for input(s): HGBA1C in the last 72 hours. Lipid Profile: No results for input(s): CHOL, HDL, LDLCALC, TRIG, CHOLHDL, LDLDIRECT in the last 72 hours. Thyroid function studies: No results for input(s): TSH, T4TOTAL, T3FREE, THYROIDAB in the last 72 hours.  Invalid input(s): FREET3 Anemia work up: No results for input(s): VITAMINB12, FOLATE, FERRITIN, TIBC, IRON, RETICCTPCT in the last 72 hours. Sepsis Labs: Recent Labs  Lab 07/25/20 0425 07/26/20 0438 07/27/20 0602 07/28/20 0411  WBC 9.2 12.0* 9.8 10.4    Microbiology Recent Results (from the past 240 hour(s))  Aerobic/Anaerobic Culture (surgical/deep wound)     Status: None   Collection Time: 07/22/20  3:00 PM   Specimen: PATH Other; Tissue  Result Value Ref Range Status   Specimen Description   Final    WOUND Performed at [facility], [address] [place], [place] [address]    Special Requests BONE COCCYX  Final   Gram Stain NO WBC SEEN NO ORGANISMS SEEN   Final   Culture   Final    No growth aerobically or anaerobically. Performed at [facility], [place] [address], [place], [place] [address]    Report Status 07/28/2020 FINAL  Final  Aerobic/Anaerobic Culture (surgical/deep wound)     Status: None   Collection Time: 07/22/20  3:04 PM   Specimen: PATH Other; Tissue  Result Value Ref Range Status   Specimen Description   Final    WOUND Performed at [facility], [address] [place], [place] [address]    Special Requests FECAL DECUBITIS  Final   Gram Stain NO WBC SEEN NO ORGANISMS SEEN   Final   Culture   Final    RARE METHICILLIN RESISTANT STAPHYLOCOCCUS  AUREUS RARE VANCOMYCIN RESISTANT ENTEROCOCCUS ISOLATED RARE BACTEROIDES THETAIOTAOMICRON BETA LACTAMASE POSITIVE Performed at [facility], [place] [address] [place],  [address]    Report Status 07/27/2020 FINAL  Final   Organism ID, Bacteria METHICILLIN RESISTANT STAPHYLOCOCCUS AUREUS  Final   Organism ID, Bacteria VANCOMYCIN RESISTANT ENTEROCOCCUS ISOLATED  Final      Susceptibility   Methicillin resistant staphylococcus aureus - MIC*    CIPROFLOXACIN >=8 RESISTANT Resistant     ERYTHROMYCIN >=8 RESISTANT Resistant     GENTAMICIN <=0.5 SENSITIVE Sensitive     OXACILLIN >=4 RESISTANT Resistant     TETRACYCLINE <=1 SENSITIVE Sensitive     VANCOMYCIN 1 SENSITIVE Sensitive     TRIMETH/SULFA >=320 RESISTANT Resistant     CLINDAMYCIN <=0.25 SENSITIVE  Sensitive     RIFAMPIN <=0.5 SENSITIVE Sensitive     Inducible Clindamycin NEGATIVE Sensitive     * RARE METHICILLIN RESISTANT STAPHYLOCOCCUS AUREUS   Vancomycin resistant enterococcus isolated - MIC*    AMPICILLIN <=2 SENSITIVE Sensitive     VANCOMYCIN >=32 RESISTANT Resistant     GENTAMICIN SYNERGY SENSITIVE Sensitive     LINEZOLID 2 SENSITIVE Sensitive     * RARE VANCOMYCIN RESISTANT ENTEROCOCCUS ISOLATED    Procedures and diagnostic studies:  No results found.             LOS: 12 days   [employer], advertising on [email or web address]. If 7PM-7AM, please contact night-coverage at [email or web address]     07/30/2020, 11:56 AM

## 2020-07-30 NOTE — Care Plan (Signed)
Refused dinner

## 2020-07-30 NOTE — Progress Notes (Signed)
OT Cancellation Note  Patient Details Name: [name] MRN: [id number] DOB: 12-May-1930   Cancelled Treatment:    Reason Eval/Treat Not Completed: Patient declined, no reason specified. Upon entering the room, pt supine in bed and smiling. Pt refused OT intervention this session secondary to pain per pt report. OT encouraged pt to participate but she continued to pleasantly refuse. OT asking pt about statement made yesterday and if she wanted to participate in OT intervention any longer to which pt reports, " I do want to try therapy". OT notified pt that we will make one more attempt tomorrow to see her if appropriate. If pt refuses next session she will be discharged from caseload secondary to lack of participation.   Darleen Crocker, MS, OTR/L , CBIS ascom [phone]  07/30/20, 3:13 PM   07/30/2020, 3:11 PM

## 2020-07-30 NOTE — Progress Notes (Signed)
Pt is alert and disoriented x4. Calm but uncooperative at times. Blood sugar been 70-80s, pt refusing to eat & drink, only had spoonfuls of ice cream, jello and sips of juice. Oncall provider informed and ordered 1/2 amp d50. BS rechecked - 103. Pt was moaning and grimacing, IV morphine given for pain. Dressing intact on sacrum and L heel. Negative pressure wound therapy in place.

## 2020-07-30 NOTE — Care Plan (Signed)
Refuses to eat breakfast.

## 2020-07-31 DIAGNOSIS — I5032 Chronic diastolic (congestive) heart failure: Secondary | ICD-10-CM | POA: Diagnosis not present

## 2020-07-31 DIAGNOSIS — E860 Dehydration: Secondary | ICD-10-CM | POA: Diagnosis not present

## 2020-07-31 DIAGNOSIS — G9341 Metabolic encephalopathy: Secondary | ICD-10-CM | POA: Diagnosis not present

## 2020-07-31 DIAGNOSIS — E43 Unspecified severe protein-calorie malnutrition: Secondary | ICD-10-CM | POA: Diagnosis not present

## 2020-07-31 LAB — GLUCOSE, CAPILLARY
Glucose-Capillary: 126 mg/dL — ABNORMAL HIGH (ref 70–99)
Glucose-Capillary: 127 mg/dL — ABNORMAL HIGH (ref 70–99)
Glucose-Capillary: 133 mg/dL — ABNORMAL HIGH (ref 70–99)
Glucose-Capillary: 134 mg/dL — ABNORMAL HIGH (ref 70–99)
Glucose-Capillary: 142 mg/dL — ABNORMAL HIGH (ref 70–99)

## 2020-07-31 LAB — BASIC METABOLIC PANEL
Anion gap: 8 (ref 5–15)
BUN: 19 mg/dL (ref 8–23)
CO2: 25 mmol/L (ref 22–32)
Calcium: 8.9 mg/dL (ref 8.9–10.3)
Chloride: 114 mmol/L — ABNORMAL HIGH (ref 98–111)
Creatinine, Ser: 1.03 mg/dL — ABNORMAL HIGH (ref 0.44–1.00)
GFR, Estimated: 52 mL/min — ABNORMAL LOW (ref >60)
Glucose, Bld: 120 mg/dL — ABNORMAL HIGH (ref 70–99)
Potassium: 3.4 mmol/L — ABNORMAL LOW (ref 3.5–5.1)
Sodium: 147 mmol/L — ABNORMAL HIGH (ref 135–145)

## 2020-07-31 LAB — MAGNESIUM: Magnesium: 1.9 mg/dL (ref 1.7–2.4)

## 2020-07-31 LAB — PHOSPHORUS: Phosphorus: 2.8 mg/dL (ref 2.5–4.6)

## 2020-07-31 MED ORDER — KCL IN DEXTROSE-NACL 40-5-0.45 MEQ/L-%-% IV SOLN
INTRAVENOUS | Status: DC
  Filled 2020-07-31 (×7): qty 1000

## 2020-07-31 NOTE — Progress Notes (Signed)
OT Cancellation Note  Patient Details Name: [name] MRN: [id number] DOB: August 15, 1929   Cancelled Treatment:    Reason Eval/Treat Not Completed: Medical issues which prohibited therapy. This will be third consecutive attempt pt has refused to participate in OT intervention. OT to SIGN OFF. Please re-consult if appropriate.  Darleen Crocker, [place], OTR/L , CBIS ascom [phone]  07/31/20, 4:06 PM   07/31/2020, 4:04 PM

## 2020-07-31 NOTE — Plan of Care (Addendum)
Pt Axox2, oriented to name and place. Pt talkative during our interaction.. Vitals with BP 160/70's. Prn Norco adm and effective. Pt slept all night/day. Safety measures in place. Will continue to monitor.                                                                                                        Problem: Education: Goal: Knowledge of General Education information will improve Description: Including pain rating scale, medication(s)/side effects and non-pharmacologic comfort measures Outcome: Progressing   Problem: Health Behavior/Discharge Planning: Goal: Ability to manage health-related needs will improve Outcome: Progressing   Problem: Clinical Measurements: Goal: Ability to maintain clinical measurements within normal limits will improve Outcome: Progressing Goal: Will remain free from infection Outcome: Progressing Goal: Diagnostic test results will improve Outcome: Progressing Goal: Respiratory complications will improve Outcome: Progressing Goal: Cardiovascular complication will be avoided Outcome: Progressing

## 2020-07-31 NOTE — Consult Note (Signed)
[place] Nurse wound follow up Wound type:Routine NPWT dressing change. Patient has been refusing meals for more than 1 day. Today took only a few bites of a pureed vegetable with breakfast. Measurement:Per Wednesday.  Dr. Mal Misty in today to assess, palpated bone in center of wound and took a photo for the EHR. Wound bed: 50% light red, 30% bone and 20% darker-hued red Drainage (amount, consistency, odor) small amount serous. Cannister on VAC changed today. Periwound: improving.  Periwound erythema previously noted persists, is blanchable and is not extending nor resolving. Dressing procedure/placement/frequency: Two pieces of black forma removed from wound. Wound cleansed with NS, and gently patted dry. Skin barrier ring is used to protect most distal portion of wound and enhance NPWT dressing seal. 1 piece of black foam is used to obliterate the dead space and this is secured with drape. A foam bridge is used to extend to the left hip and is placed upon a layer of drape then covered with an additional layer of drape.  A T.R.A.C. ad is used to secure the dressing to 121mmHg continuous negative pressure and an immediate seal is achieved.  The assistance of her bedside RN, Lenna Sciara. Suezanne Cheshire is appreciated, both for premedication of the patient and assistance in patient repositioning and dressing placement.   Dressings in room for next several dressing changes (2) plus skin barrier rings to enhance seal at most distal margin. Patient is wearing her Prevalon boots and the heel wound is not assessed today.  Next dressing change is due on Monday, 08/03/20.  [facility] nursing team will not follow, and will remain available to this patient, the nursing and medical teams.   Thanks, Maudie Flakes, MSN, RN, [place], Arther Abbott  Pager# [phone]

## 2020-07-31 NOTE — Progress Notes (Addendum)
Progress Note    [name]  [name]:[id number] DOB: 1929/11/01  DOA: 07/18/2020 PCP: Biagio Borg, MD      Brief Narrative:    Medical records reviewed and are as summarized below:  [name] is a 87 y.o. female       Assessment/Plan:   Active Problems:   Diabetes ([place])   Hyperlipidemia   Essential hypertension   Malignant neoplasm of upper-outer quadrant of right breast in female, estrogen receptor positive ([place])   Wound healing, delayed   AKI (acute kidney injury) ([place])   CKD (chronic kidney disease) stage 3, GFR 30-59 ml/min ([name])   Chronic diastolic CHF (congestive heart failure) ([place])   Protein-calorie malnutrition, severe ([name])   Dehydration   Hyperkalemia   Acute metabolic encephalopathy   Sacral decubitus ulcer, stage IV ([place])   Nutrition Problem: Increased nutrient needs Etiology: wound healing  Signs/Symptoms: estimated needs   Body mass index is 35.95 kg/m.  (Morbid obesity)   Acute metabolic encephalopathy Suspected hospital-acquired delirium No further inpatient intervention per neurosurgeon. Antipsychotics as needed for agitation.   Stage IV sacral decubitus ulcer, unstageable left heel decubitus ulcer Status post debridement on 07/22/2020 Wound VAC tubing was disconnected on 07/29/2020 around 3 AM.   Wound VAC was replaced by wound care nurse on 07/29/2020. General surgeon has signed off. Wound growing VRE and MRSA Patient is not a candidate for diverting colostomy per general surgeon. Continue empiric IV antibiotics and analgesics as needed for pain Plan to transition to Augmentin on doxycycline for 2 weeks at discharge. UTI and pneumonia ruled out. Urine culture showed Enterococcus faecalis but per ID this is likely a contaminant.  Severe protein-calorie malnutrition, poor oral intake Patient has labile mental status with agitation and combativeness.  There is some concern that she will remove NG tube or PEG tube and so these  are not feasible at the moment.  She still refuses most of her meals.  Feeding has been an issue for nursing staff because of intermittent agitation and combativeness.   Continue IV fluids for hydration.  Hypernatremia and hypokalemia Restarted IV fluids with potassium today to correct electrolytes.   Acute intraventricular hemorrhage-Trace No further work-up from neurosurgeon.  AKI(acute kidney injury) ([place]) -appears to be prerenal-dehydration Lisinopril and Lasix have been held.   Other comorbidities include chronic diastolic CHF, hypertension, hyperlipidemia, type II DM (hemoglobin A1c 5.6)  Plan of care was discussed with her son, [name].  Goals of care were discussed.  He said they were not ready as a family to proceed with comfort care.  He was asked to continue current treatment and they will make a decision in the near future depending on how she does.    Diet Order            DIET - DYS 1 Room service appropriate? No; Fluid consistency: Thin  Diet effective now                    Consultants:  General surgeon  Palliative care  Procedures:  Surgical debridement of sacral decubitus ulcer on 07/22/2020    Medications:   . allopurinol  100 mg Oral Daily  . amLODipine  5 mg Oral Daily  . vitamin C  250 mg Oral BID  . Chlorhexidine Gluconate Cloth  6 each Topical Daily  . feeding supplement  237 mL Oral TID BM  . insulin aspart  0-6 Units Subcutaneous Q4H  . mirtazapine  15  mg Oral QHS  . multivitamin with minerals  1 tablet Oral Daily   Continuous Infusions: . ampicillin-sulbactam (UNASYN) IV 3 g (07/31/20 1228)  . dextrose 5 % and 0.45 % NaCl with KCl 40 mEq/L 75 mL/hr at 07/31/20 0943  . doxycycline (VIBRAMYCIN) IV 100 mg (07/31/20 0941)     Anti-infectives (From admission, onward)   Start     Dose/Rate Route Frequency Ordered Stop   07/31/20 1000  doxycycline (VIBRAMYCIN) 100 mg in sodium chloride 0.9 % 250 mL IVPB        100 mg 125 mL/hr over  120 Minutes Intravenous Every 12 hours 07/30/20 1842     07/27/20 1800  vancomycin (VANCOREADY) IVPB 750 mg/150 mL  Status:  Discontinued        750 mg 150 mL/hr over 60 Minutes Intravenous Every 24 hours 07/26/20 1500 07/30/20 1842   07/26/20 1800  Ampicillin-Sulbactam (UNASYN) 3 g in sodium chloride 0.9 % 100 mL IVPB        3 g 200 mL/hr over 30 Minutes Intravenous Every 6 hours 07/26/20 1522     07/26/20 1700  vancomycin (VANCOREADY) IVPB 2000 mg/400 mL        2,000 mg 200 mL/hr over 120 Minutes Intravenous  Once 07/26/20 1500 07/26/20 2020   07/26/20 1600  clindamycin (CLEOCIN) IVPB 600 mg  Status:  Discontinued        600 mg 100 mL/hr over 30 Minutes Intravenous Every 8 hours 07/26/20 1500 07/26/20 1522   07/25/20 1200  Ampicillin-Sulbactam (UNASYN) 3 g in sodium chloride 0.9 % 100 mL IVPB  Status:  Discontinued        3 g 200 mL/hr over 30 Minutes Intravenous Every 6 hours 07/25/20 0725 07/26/20 1500   07/21/20 1400  Ampicillin-Sulbactam (UNASYN) 3 g in sodium chloride 0.9 % 100 mL IVPB  Status:  Discontinued        3 g 200 mL/hr over 30 Minutes Intravenous Every 12 hours 07/21/20 1308 07/25/20 0725   07/19/20 0600  cefTRIAXone (ROCEPHIN) 2 g in sodium chloride 0.9 % 100 mL IVPB  Status:  Discontinued        2 g 200 mL/hr over 30 Minutes Intravenous Every 24 hours 07/18/20 2356 07/21/20 1306   07/19/20 0000  azithromycin (ZITHROMAX) 500 mg in sodium chloride 0.9 % 250 mL IVPB  Status:  Discontinued        500 mg 250 mL/hr over 60 Minutes Intravenous Every 24 hours 07/18/20 2356 07/21/20 1306   07/18/20 1900  vancomycin (VANCOCIN) IVPB 1000 mg/200 mL premix        1,000 mg 200 mL/hr over 60 Minutes Intravenous  Once 07/18/20 1849 07/18/20 2254   07/18/20 1900  ceFEPIme (MAXIPIME) 2 g in sodium chloride 0.9 % 100 mL IVPB        2 g 200 mL/hr over 30 Minutes Intravenous  Once 07/18/20 1849 07/18/20 2104             Family Communication/Anticipated D/C date and plan/Code  Status   DVT prophylaxis: SCDs Start: 07/19/20 1638 SCDs Start: 07/18/20 2349     Code Status: DNR  Family Communication: None Disposition Plan:    Status is: Inpatient  Remains inpatient appropriate because:IV treatments appropriate due to intensity of illness or inability to take PO   Dispo: The patient is from: Home              Anticipated d/c is to: SNF  Anticipated d/c date is: 3 days              Patient currently is not medically stable to d/c.   Difficult to place patient No           Subjective:   Interval events noted.  She feels okay and has no complaints.  Her nurse Joy, and wound care nurse, Margarita Grizzle, were at the bedside.  Objective:    Vitals:   07/30/20 1529 07/30/20 2048 07/31/20 0045 07/31/20 1133  BP: (!) 161/62 (!) 161/81 (!) 160/64 (!) 161/48  Pulse: 85 81 66 67  Resp: 16 18 18 18   Temp: 97.7 F (36.5 C) 98 F (36.7 C) 98.1 F (36.7 C) 98.2 F (36.8 C)  TempSrc: Axillary Oral Oral Oral  SpO2: 95% 98% 99% 98%  Weight:      Height:       No data found.   Intake/Output Summary (Last 24 hours) at 07/31/2020 1529 Last data filed at 07/31/2020 0500 Gross per 24 hour  Intake 347.13 ml  Output 800 ml  Net -452.87 ml   Filed Weights   07/18/20 1603  Weight: 98 kg    Exam:  GEN: NAD SKIN: Stage IV sacral decubitus ulcer, unstageable left heel decubitus ulcer EYES: No pallor or icterus ENT: MMM CV: RRR PULM: CTA B ABD: soft, ND, NT, +BS CNS: AAO x person and place, non focal EXT: No edema or tenderness           Pressure Injury 07/19/20 Sacrum Medial Unstageable - Full thickness tissue loss in which the base of the injury is covered by slough (yellow, tan, gray, green or brown) and/or eschar (tan, brown or black) in the wound bed. small amount of tunneling with  (Active)  07/19/20 0211  Location: Sacrum  Location Orientation: Medial  Staging: Unstageable - Full thickness tissue loss in which the base of  the injury is covered by slough (yellow, tan, gray, green or brown) and/or eschar (tan, brown or black) in the wound bed.  Wound Description (Comments): small amount of tunneling with eschar  Present on Admission: Yes     Pressure Injury 07/19/20 Heel Left Unstageable - Full thickness tissue loss in which the base of the injury is covered by slough (yellow, tan, gray, green or brown) and/or eschar (tan, brown or black) in the wound bed. eschar around edge of injury (Active)  07/19/20 0214  Location: Heel  Location Orientation: Left  Staging: Unstageable - Full thickness tissue loss in which the base of the injury is covered by slough (yellow, tan, gray, green or brown) and/or eschar (tan, brown or black) in the wound bed.  Wound Description (Comments): eschar around edge of injury  Present on Admission: Yes     Data Reviewed:   I have personally reviewed following labs and imaging studies:  Labs: Labs show the following:   Basic Metabolic Panel: Recent Labs  Lab 07/25/20 0425 07/26/20 0438 07/27/20 0602 07/28/20 0411 07/29/20 0456 07/30/20 0358 07/31/20 0340  NA 145 143  --  145 144  --  147*  K 3.1* 3.8  --  2.9* 3.1* 3.7 3.4*  CL 107 104  --  107 109  --  114*  CO2 29 26  --  25 27  --  25  GLUCOSE 112* 96  --  98 106*  --  120*  BUN 50* 41*  --  28* 24*  --  19  CREATININE 1.18* 1.18* 1.11* 1.14* 0.99  1.05* 1.03*  CALCIUM 9.2 9.8  --  9.0 8.8*  --  8.9  MG 1.5* 2.2 1.8 1.8 2.3 2.0 1.9  PHOS  --   --   --   --   --  2.4* 2.8   GFR Estimated Creatinine Clearance: 43.7 mL/min (A) (by C-G formula based on SCr of 1.03 mg/dL (H)). Liver Function Tests: No results for input(s): AST, ALT, ALKPHOS, BILITOT, PROT, ALBUMIN in the last 168 hours. No results for input(s): LIPASE, AMYLASE in the last 168 hours. No results for input(s): AMMONIA in the last 168 hours. Coagulation profile No results for input(s): INR, PROTIME in the last 168 hours.  CBC: Recent Labs  Lab  07/25/20 0425 07/26/20 0438 07/27/20 0602 07/28/20 0411  WBC 9.2 12.0* 9.8 10.4  NEUTROABS 6.7 7.3 6.8 7.3  HGB 9.0* 11.0* 10.1* 9.6*  HCT 29.6* 36.1 32.4* 30.9*  MCV 89.4 88.5 87.1 86.6  PLT 283 374 306 325   Cardiac Enzymes: No results for input(s): CKTOTAL, CKMB, CKMBINDEX, TROPONINI in the last 168 hours. BNP (last 3 results) No results for input(s): PROBNP in the last 8760 hours. CBG: Recent Labs  Lab 07/30/20 1126 07/30/20 1528 07/30/20 2101 07/31/20 0042 07/31/20 1134  GLUCAP 80 102* 123* 126* 142*   D-Dimer: No results for input(s): DDIMER in the last 72 hours. Hgb A1c: No results for input(s): HGBA1C in the last 72 hours. Lipid Profile: No results for input(s): CHOL, HDL, LDLCALC, TRIG, CHOLHDL, LDLDIRECT in the last 72 hours. Thyroid function studies: No results for input(s): TSH, T4TOTAL, T3FREE, THYROIDAB in the last 72 hours.  Invalid input(s): FREET3 Anemia work up: No results for input(s): VITAMINB12, FOLATE, FERRITIN, TIBC, IRON, RETICCTPCT in the last 72 hours. Sepsis Labs: Recent Labs  Lab 07/25/20 0425 07/26/20 0438 07/27/20 0602 07/28/20 0411  WBC 9.2 12.0* 9.8 10.4    Microbiology Recent Results (from the past 240 hour(s))  Aerobic/Anaerobic Culture (surgical/deep wound)     Status: None   Collection Time: 07/22/20  3:00 PM   Specimen: PATH Other; Tissue  Result Value Ref Range Status   Specimen Description   Final    WOUND Performed at [facility], [address] [place], [place] [address]    Special Requests BONE COCCYX  Final   Gram Stain NO WBC SEEN NO ORGANISMS SEEN   Final   Culture   Final    No growth aerobically or anaerobically. Performed at [facility], [place] [address] [place], [place] [address]    Report Status 07/28/2020 FINAL  Final  Aerobic/Anaerobic Culture (surgical/deep wound)     Status: None   Collection Time: 07/22/20  3:04 PM   Specimen: PATH Other; Tissue  Result Value Ref Range Status    Specimen Description   Final    WOUND Performed at [facility], [address] [place], [place] [address]    Special Requests FECAL DECUBITIS  Final   Gram Stain NO WBC SEEN NO ORGANISMS SEEN   Final   Culture   Final    RARE METHICILLIN RESISTANT STAPHYLOCOCCUS AUREUS RARE VANCOMYCIN RESISTANT ENTEROCOCCUS ISOLATED RARE BACTEROIDES THETAIOTAOMICRON BETA LACTAMASE POSITIVE Performed at [facility], [place] [address] [place], [place] [address]    Report Status 07/27/2020 FINAL  Final   Organism ID, Bacteria METHICILLIN RESISTANT STAPHYLOCOCCUS AUREUS  Final   Organism ID, Bacteria VANCOMYCIN RESISTANT ENTEROCOCCUS ISOLATED  Final      Susceptibility   Methicillin resistant staphylococcus aureus -  MIC*    CIPROFLOXACIN >=8 RESISTANT Resistant     ERYTHROMYCIN >=8 RESISTANT Resistant     GENTAMICIN <=0.5 SENSITIVE Sensitive     OXACILLIN >=4 RESISTANT Resistant     TETRACYCLINE <=1 SENSITIVE Sensitive     VANCOMYCIN 1 SENSITIVE Sensitive     TRIMETH/SULFA >=320 RESISTANT Resistant     CLINDAMYCIN <=0.25 SENSITIVE Sensitive     RIFAMPIN <=0.5 SENSITIVE Sensitive     Inducible Clindamycin NEGATIVE Sensitive     * RARE METHICILLIN RESISTANT STAPHYLOCOCCUS AUREUS   Vancomycin resistant enterococcus isolated - MIC*    AMPICILLIN <=2 SENSITIVE Sensitive     VANCOMYCIN >=32 RESISTANT Resistant     GENTAMICIN SYNERGY SENSITIVE Sensitive     LINEZOLID 2 SENSITIVE Sensitive     * RARE VANCOMYCIN RESISTANT ENTEROCOCCUS ISOLATED    Procedures and diagnostic studies:  No results found.             LOS: 13 days   [employer], advertising on [email or web address]. If 7PM-7AM, please contact night-coverage at [email or web address]     07/31/2020, 3:29 PM

## 2020-08-01 DIAGNOSIS — L89154 Pressure ulcer of sacral region, stage 4: Secondary | ICD-10-CM | POA: Diagnosis not present

## 2020-08-01 DIAGNOSIS — N179 Acute kidney failure, unspecified: Secondary | ICD-10-CM | POA: Diagnosis not present

## 2020-08-01 DIAGNOSIS — G9341 Metabolic encephalopathy: Secondary | ICD-10-CM | POA: Diagnosis not present

## 2020-08-01 DIAGNOSIS — I5032 Chronic diastolic (congestive) heart failure: Secondary | ICD-10-CM | POA: Diagnosis not present

## 2020-08-01 LAB — GLUCOSE, CAPILLARY
Glucose-Capillary: 106 mg/dL — ABNORMAL HIGH (ref 70–99)
Glucose-Capillary: 109 mg/dL — ABNORMAL HIGH (ref 70–99)
Glucose-Capillary: 118 mg/dL — ABNORMAL HIGH (ref 70–99)
Glucose-Capillary: 124 mg/dL — ABNORMAL HIGH (ref 70–99)
Glucose-Capillary: 125 mg/dL — ABNORMAL HIGH (ref 70–99)
Glucose-Capillary: 125 mg/dL — ABNORMAL HIGH (ref 70–99)

## 2020-08-01 LAB — BASIC METABOLIC PANEL
Anion gap: 7 (ref 5–15)
BUN: 18 mg/dL (ref 8–23)
CO2: 24 mmol/L (ref 22–32)
Calcium: 8.6 mg/dL — ABNORMAL LOW (ref 8.9–10.3)
Chloride: 115 mmol/L — ABNORMAL HIGH (ref 98–111)
Creatinine, Ser: 0.97 mg/dL (ref 0.44–1.00)
GFR, Estimated: 56 mL/min — ABNORMAL LOW (ref >60)
Glucose, Bld: 107 mg/dL — ABNORMAL HIGH (ref 70–99)
Potassium: 3.9 mmol/L (ref 3.5–5.1)
Sodium: 146 mmol/L — ABNORMAL HIGH (ref 135–145)

## 2020-08-01 NOTE — Progress Notes (Signed)
Progress Note    [name]  [id number] DOB: 1929-07-11  DOA: 07/18/2020 PCP: Biagio Borg, MD      Brief Narrative:    Medical records reviewed and are as summarized below:  [name] is a 87 y.o. female       Assessment/Plan:   Active Problems:   Diabetes ([place])   Hyperlipidemia   Essential hypertension   Malignant neoplasm of upper-outer quadrant of right breast in female, estrogen receptor positive ([place])   Wound healing, delayed   AKI (acute kidney injury) ([place])   CKD (chronic kidney disease) stage 3, GFR 30-59 ml/min ([name])   Chronic diastolic CHF (congestive heart failure) ([place])   Protein-calorie malnutrition, severe ([name])   Dehydration   Hyperkalemia   Acute metabolic encephalopathy   Sacral decubitus ulcer, stage IV ([place])   Nutrition Problem: Increased nutrient needs Etiology: wound healing  Signs/Symptoms: estimated needs   Body mass index is 35.95 kg/m.  (Morbid obesity)   Acute metabolic encephalopathy Suspected hospital-acquired delirium No further inpatient intervention per neurosurgeon. Antipsychotics as needed for agitation.   Stage IV sacral decubitus ulcer, unstageable left heel decubitus ulcer Status post debridement on 07/22/2020 Wound VAC tubing was disconnected on 07/29/2020 around 3 AM.   Wound VAC was replaced by wound care nurse on 07/29/2020. General surgeon has signed off. Wound growing VRE and MRSA Patient is not a candidate for diverting colostomy per general surgeon. Continue IV antibiotics and analgesics. Plan to transition to Augmentin on doxycycline for 2 weeks at discharge. UTI and pneumonia ruled out. Urine culture showed Enterococcus faecalis but per ID this is likely a contaminant.  Severe protein-calorie malnutrition, poor oral intake Patient has labile mental status with agitation and combativeness.  There is some concern that she will remove NG tube or PEG tube and so these are not feasible at the  moment.  She is still not eating well.  Feeding has been an issue for nursing staff because of intermittent agitation and combativeness.   Continue IV fluids for hydration. Family has not made any decision regarding feeding tube.  Hypernatremia and hypokalemia Improving.  Continue IV fluids and replete potassium.  Monitor BMP   Acute intraventricular hemorrhage-Trace No further work-up from neurosurgeon.  AKI(acute kidney injury) ([place]) -appears to be prerenal-dehydration Lisinopril and Lasix have been held.   Other comorbidities include chronic diastolic CHF, hypertension, hyperlipidemia, type II DM (hemoglobin A1c 5.6)    Diet Order            DIET - DYS 1 Room service appropriate? No; Fluid consistency: Thin  Diet effective now                    Consultants:  General surgeon  Palliative care  Procedures:  Surgical debridement of sacral decubitus ulcer on 07/22/2020    Medications:   . allopurinol  100 mg Oral Daily  . amLODipine  5 mg Oral Daily  . vitamin C  250 mg Oral BID  . Chlorhexidine Gluconate Cloth  6 each Topical Daily  . feeding supplement  237 mL Oral TID BM  . insulin aspart  0-6 Units Subcutaneous Q4H  . mirtazapine  15 mg Oral QHS  . multivitamin with minerals  1 tablet Oral Daily   Continuous Infusions: . ampicillin-sulbactam (UNASYN) IV 3 g (08/01/20 0459)  . dextrose 5 % and 0.45 % NaCl with KCl 40 mEq/L 75 mL/hr at 08/01/20 0003  . doxycycline (VIBRAMYCIN) IV 100  mg (08/01/20 1005)     Anti-infectives (From admission, onward)   Start     Dose/Rate Route Frequency Ordered Stop   07/31/20 1000  doxycycline (VIBRAMYCIN) 100 mg in sodium chloride 0.9 % 250 mL IVPB        100 mg 125 mL/hr over 120 Minutes Intravenous Every 12 hours 07/30/20 1842     07/27/20 1800  vancomycin (VANCOREADY) IVPB 750 mg/150 mL  Status:  Discontinued        750 mg 150 mL/hr over 60 Minutes Intravenous Every 24 hours 07/26/20 1500 07/30/20 1842    07/26/20 1800  Ampicillin-Sulbactam (UNASYN) 3 g in sodium chloride 0.9 % 100 mL IVPB        3 g 200 mL/hr over 30 Minutes Intravenous Every 6 hours 07/26/20 1522     07/26/20 1700  vancomycin (VANCOREADY) IVPB 2000 mg/400 mL        2,000 mg 200 mL/hr over 120 Minutes Intravenous  Once 07/26/20 1500 07/26/20 2020   07/26/20 1600  clindamycin (CLEOCIN) IVPB 600 mg  Status:  Discontinued        600 mg 100 mL/hr over 30 Minutes Intravenous Every 8 hours 07/26/20 1500 07/26/20 1522   07/25/20 1200  Ampicillin-Sulbactam (UNASYN) 3 g in sodium chloride 0.9 % 100 mL IVPB  Status:  Discontinued        3 g 200 mL/hr over 30 Minutes Intravenous Every 6 hours 07/25/20 0725 07/26/20 1500   07/21/20 1400  Ampicillin-Sulbactam (UNASYN) 3 g in sodium chloride 0.9 % 100 mL IVPB  Status:  Discontinued        3 g 200 mL/hr over 30 Minutes Intravenous Every 12 hours 07/21/20 1308 07/25/20 0725   07/19/20 0600  cefTRIAXone (ROCEPHIN) 2 g in sodium chloride 0.9 % 100 mL IVPB  Status:  Discontinued        2 g 200 mL/hr over 30 Minutes Intravenous Every 24 hours 07/18/20 2356 07/21/20 1306   07/19/20 0000  azithromycin (ZITHROMAX) 500 mg in sodium chloride 0.9 % 250 mL IVPB  Status:  Discontinued        500 mg 250 mL/hr over 60 Minutes Intravenous Every 24 hours 07/18/20 2356 07/21/20 1306   07/18/20 1900  vancomycin (VANCOCIN) IVPB 1000 mg/200 mL premix        1,000 mg 200 mL/hr over 60 Minutes Intravenous  Once 07/18/20 1849 07/18/20 2254   07/18/20 1900  ceFEPIme (MAXIPIME) 2 g in sodium chloride 0.9 % 100 mL IVPB        2 g 200 mL/hr over 30 Minutes Intravenous  Once 07/18/20 1849 07/18/20 2104             Family Communication/Anticipated D/C date and plan/Code Status   DVT prophylaxis: SCDs Start: 07/19/20 1638 SCDs Start: 07/18/20 2349     Code Status: DNR  Family Communication: None Disposition Plan:    Status is: Inpatient  Remains inpatient appropriate because:IV treatments  appropriate due to intensity of illness or inability to take PO   Dispo: The patient is from: Home              Anticipated d/c is to: SNF              Anticipated d/c date is: 3 days              Patient currently is not medically stable to d/c.   Difficult to place patient No  Subjective:   Interval events noted.  Her nurse, Celine, was at the bedside  Objective:    Vitals:   07/31/20 2317 08/01/20 0456 08/01/20 0827 08/01/20 1212  BP: 140/63 (!) 153/69 (!) 146/86 (!) 157/56  Pulse: 70 79 81 80  Resp: 14 18 18 16   Temp: 98.7 F (37.1 C) 98 F (36.7 C) 98.2 F (36.8 C)   TempSrc: Oral Oral Oral   SpO2: 97% 95% 96% 95%  Weight:      Height:       No data found.   Intake/Output Summary (Last 24 hours) at 08/01/2020 1242 Last data filed at 08/01/2020 0654 Gross per 24 hour  Intake --  Output 1550 ml  Net -1550 ml   Filed Weights   07/18/20 1603  Weight: 98 kg    Exam:  GEN: NAD SKIN: Stage IV sacral decubitus ulcer, unstageable left heel decubitus ulcer  EYES: No pallor or icterus ENT: MMM CV: RRR PULM: CTA B ABD: soft, ND, NT, +BS CNS: Alert and oriented to person and place EXT: No edema or tenderness             Data Reviewed:   I have personally reviewed following labs and imaging studies:  Labs: Labs show the following:   Basic Metabolic Panel: Recent Labs  Lab 07/26/20 0438 07/27/20 0602 07/28/20 0411 07/29/20 0456 07/30/20 0358 07/31/20 0340 08/01/20 0420  NA 143  --  145 144  --  147* 146*  K 3.8  --  2.9* 3.1* 3.7 3.4* 3.9  CL 104  --  107 109  --  114* 115*  CO2 26  --  25 27  --  25 24  GLUCOSE 96  --  98 106*  --  120* 107*  BUN 41*  --  28* 24*  --  19 18  CREATININE 1.18* 1.11* 1.14* 0.99 1.05* 1.03* 0.97  CALCIUM 9.8  --  9.0 8.8*  --  8.9 8.6*  MG 2.2 1.8 1.8 2.3 2.0 1.9  --   PHOS  --   --   --   --  2.4* 2.8  --    GFR Estimated Creatinine Clearance: 46.5 mL/min (by C-G formula based on SCr  of 0.97 mg/dL). Liver Function Tests: No results for input(s): AST, ALT, ALKPHOS, BILITOT, PROT, ALBUMIN in the last 168 hours. No results for input(s): LIPASE, AMYLASE in the last 168 hours. No results for input(s): AMMONIA in the last 168 hours. Coagulation profile No results for input(s): INR, PROTIME in the last 168 hours.  CBC: Recent Labs  Lab 07/26/20 0438 07/27/20 0602 07/28/20 0411  WBC 12.0* 9.8 10.4  NEUTROABS 7.3 6.8 7.3  HGB 11.0* 10.1* 9.6*  HCT 36.1 32.4* 30.9*  MCV 88.5 87.1 86.6  PLT 374 306 325   Cardiac Enzymes: No results for input(s): CKTOTAL, CKMB, CKMBINDEX, TROPONINI in the last 168 hours. BNP (last 3 results) No results for input(s): PROBNP in the last 8760 hours. CBG: Recent Labs  Lab 07/31/20 1559 07/31/20 2057 07/31/20 2351 08/01/20 0440 08/01/20 1226  GLUCAP 134* 133* 127* 109* 124*   D-Dimer: No results for input(s): DDIMER in the last 72 hours. Hgb A1c: No results for input(s): HGBA1C in the last 72 hours. Lipid Profile: No results for input(s): CHOL, HDL, LDLCALC, TRIG, CHOLHDL, LDLDIRECT in the last 72 hours. Thyroid function studies: No results for input(s): TSH, T4TOTAL, T3FREE, THYROIDAB in the last 72 hours.  Invalid input(s): FREET3 Anemia work up: No  results for input(s): VITAMINB12, FOLATE, FERRITIN, TIBC, IRON, RETICCTPCT in the last 72 hours. Sepsis Labs: Recent Labs  Lab 07/26/20 0438 07/27/20 0602 07/28/20 0411  WBC 12.0* 9.8 10.4    Microbiology Recent Results (from the past 240 hour(s))  Aerobic/Anaerobic Culture (surgical/deep wound)     Status: None   Collection Time: 07/22/20  3:00 PM   Specimen: PATH Other; Tissue  Result Value Ref Range Status   Specimen Description   Final    WOUND Performed at [facility], [address] [place], [place] [address]    Special Requests BONE COCCYX  Final   Gram Stain NO WBC SEEN NO ORGANISMS SEEN   Final   Culture   Final    No growth aerobically or  anaerobically. Performed at [facility], [place] [address], [place], [place] [address]    Report Status 07/28/2020 FINAL  Final  Aerobic/Anaerobic Culture (surgical/deep wound)     Status: None   Collection Time: 07/22/20  3:04 PM   Specimen: PATH Other; Tissue  Result Value Ref Range Status   Specimen Description   Final    WOUND Performed at [facility], [address] [place], [place] [address]    Special Requests FECAL DECUBITIS  Final   Gram Stain NO WBC SEEN NO ORGANISMS SEEN   Final   Culture   Final    RARE METHICILLIN RESISTANT STAPHYLOCOCCUS AUREUS RARE VANCOMYCIN RESISTANT ENTEROCOCCUS ISOLATED RARE BACTEROIDES THETAIOTAOMICRON BETA LACTAMASE POSITIVE Performed at [facility], [place] [address] [place], [place] [address]    Report Status 07/27/2020 FINAL  Final   Organism ID, Bacteria METHICILLIN RESISTANT STAPHYLOCOCCUS AUREUS  Final   Organism ID, Bacteria VANCOMYCIN RESISTANT ENTEROCOCCUS ISOLATED  Final      Susceptibility   Methicillin resistant staphylococcus aureus - MIC*    CIPROFLOXACIN >=8 RESISTANT Resistant     ERYTHROMYCIN >=8 RESISTANT Resistant     GENTAMICIN <=0.5 SENSITIVE Sensitive     OXACILLIN >=4 RESISTANT Resistant     TETRACYCLINE <=1 SENSITIVE Sensitive     VANCOMYCIN 1 SENSITIVE Sensitive     TRIMETH/SULFA >=320 RESISTANT Resistant     CLINDAMYCIN <=0.25 SENSITIVE Sensitive     RIFAMPIN <=0.5 SENSITIVE Sensitive     Inducible Clindamycin NEGATIVE Sensitive     * RARE METHICILLIN RESISTANT STAPHYLOCOCCUS AUREUS   Vancomycin resistant enterococcus isolated - MIC*    AMPICILLIN <=2 SENSITIVE Sensitive     VANCOMYCIN >=32 RESISTANT Resistant     GENTAMICIN SYNERGY SENSITIVE Sensitive     LINEZOLID 2 SENSITIVE Sensitive     * RARE VANCOMYCIN RESISTANT ENTEROCOCCUS ISOLATED    Procedures and diagnostic studies:  No results found.             LOS: 14 days   [place] Copywriter, advertising on  [email or web address]. If 7PM-7AM, please contact night-coverage at [email or web address]     08/01/2020, 12:42 PM

## 2020-08-01 NOTE — Plan of Care (Signed)
Pt alert and talkative most of the day. This afternoon she had an episode of being lethargic/obtunded. MD notified.  Vitals stable. Blood sugar level WNL. Pt calm and cooperative and able to ask for pain meds. On IVF and IV Abx. On RA. Wound vac to sacral dressing in place. Safety measures in place. Will continue to monitor.  Problem: Education: Goal: Knowledge of General Education information will improve Description: Including pain rating scale, medication(s)/side effects and non-pharmacologic comfort measures Outcome: Progressing   Problem: Health Behavior/Discharge Planning: Goal: Ability to manage health-related needs will improve Outcome: Progressing   Problem: Clinical Measurements: Goal: Ability to maintain clinical measurements within normal limits will improve Outcome: Progressing Goal: Will remain free from infection Outcome: Progressing Goal: Diagnostic test results will improve Outcome: Progressing Goal: Respiratory complications will improve Outcome: Progressing Goal: Cardiovascular complication will be avoided Outcome: Progressing   Problem: Activity: Goal: Risk for activity intolerance will decrease Outcome: Progressing   Problem: Nutrition: Goal: Adequate nutrition will be maintained Outcome: Progressing   Problem: Coping: Goal: Level of anxiety will decrease Outcome: Progressing   Problem: Elimination: Goal: Will not experience complications related to bowel motility Outcome: Progressing Goal: Will not experience complications related to urinary retention Outcome: Progressing   Problem: Pain Managment: Goal: General experience of comfort will improve Outcome: Progressing   Problem: Safety: Goal: Ability to remain free from injury will improve Outcome: Progressing   Problem: Skin Integrity: Goal: Risk for impaired skin integrity will decrease Outcome: Progressing

## 2020-08-02 DIAGNOSIS — N1831 Chronic kidney disease, stage 3a: Secondary | ICD-10-CM | POA: Diagnosis not present

## 2020-08-02 DIAGNOSIS — N179 Acute kidney failure, unspecified: Secondary | ICD-10-CM | POA: Diagnosis not present

## 2020-08-02 DIAGNOSIS — G9341 Metabolic encephalopathy: Secondary | ICD-10-CM | POA: Diagnosis not present

## 2020-08-02 DIAGNOSIS — I5032 Chronic diastolic (congestive) heart failure: Secondary | ICD-10-CM | POA: Diagnosis not present

## 2020-08-02 LAB — BASIC METABOLIC PANEL
Anion gap: 7 (ref 5–15)
BUN: 17 mg/dL (ref 8–23)
CO2: 22 mmol/L (ref 22–32)
Calcium: 9.1 mg/dL (ref 8.9–10.3)
Chloride: 113 mmol/L — ABNORMAL HIGH (ref 98–111)
Creatinine, Ser: 1.17 mg/dL — ABNORMAL HIGH (ref 0.44–1.00)
GFR, Estimated: 45 mL/min — ABNORMAL LOW (ref >60)
Glucose, Bld: 121 mg/dL — ABNORMAL HIGH (ref 70–99)
Potassium: 5.1 mmol/L (ref 3.5–5.1)
Sodium: 142 mmol/L (ref 135–145)

## 2020-08-02 LAB — GLUCOSE, CAPILLARY
Glucose-Capillary: 118 mg/dL — ABNORMAL HIGH (ref 70–99)
Glucose-Capillary: 127 mg/dL — ABNORMAL HIGH (ref 70–99)
Glucose-Capillary: 127 mg/dL — ABNORMAL HIGH (ref 70–99)
Glucose-Capillary: 107 mg/dL — ABNORMAL HIGH (ref 70–99)
Glucose-Capillary: 100 mg/dL — ABNORMAL HIGH (ref 70–99)

## 2020-08-02 MED ORDER — DEXTROSE-NACL 5-0.45 % IV SOLN: INTRAVENOUS | Status: DC

## 2020-08-02 NOTE — Progress Notes (Signed)
Patient refused all oral medication and would not let me assess her. She hollers "no" whenever you move her but refuses pain medication. Will continue to monitor.  Christene Slates

## 2020-08-02 NOTE — Progress Notes (Signed)
Progress Note    [name]  [id number] DOB: 10/14/1929  DOA: 07/18/2020 PCP: Biagio Borg, MD      Brief Narrative:    Medical records reviewed and are as summarized below:  [name] is a 87 y.o. female       Assessment/Plan:   Active Problems:   Diabetes ([place])   Hyperlipidemia   Essential hypertension   Malignant neoplasm of upper-outer quadrant of right breast in female, estrogen receptor positive ([place])   Wound healing, delayed   AKI (acute kidney injury) ([place])   CKD (chronic kidney disease) stage 3, GFR 30-59 ml/min ([name])   Chronic diastolic CHF (congestive heart failure) ([place])   Protein-calorie malnutrition, severe ([name])   Dehydration   Hyperkalemia   Acute metabolic encephalopathy   Sacral decubitus ulcer, stage IV ([place])   Nutrition Problem: Increased nutrient needs Etiology: wound healing  Signs/Symptoms: estimated needs   Body mass index is 35.95 kg/m.  (Morbid obesity)   Acute metabolic encephalopathy Suspected hospital-acquired delirium No further inpatient intervention per neurosurgeon. Antipsychotics as needed for agitation.   Stage IV sacral decubitus ulcer, unstageable left heel decubitus ulcer Status post debridement on 07/22/2020 Wound VAC tubing was disconnected on 07/29/2020 around 3 AM.   Wound VAC was replaced by wound care nurse on 07/29/2020. General surgeon has signed off. Wound growing VRE and MRSA Patient is not a candidate for diverting colostomy per general surgeon. Continue IV antibiotics and analgesics. Plan to transition to Augmentin on doxycycline for 2 weeks at discharge. UTI and pneumonia ruled out. Urine culture showed Enterococcus faecalis but per ID this is likely a contaminant.  Severe protein-calorie malnutrition, poor oral intake Patient has labile mental status with agitation and combativeness.  There is some concern that she will remove NG tube or PEG tube and so these are not feasible at the  moment.  She still refusing to eat or drink.  Continue IV fluids for hydration.  Change IV fluids from D5 half NS with KCl to D5 half NS. Discussed options with her son, [name]  Hypernatremia and hypokalemia Improved.  Acute intraventricular hemorrhage-Trace No further work-up from neurosurgeon.  AKI(acute kidney injury) ([place]) -appears to be prerenal-dehydration Lisinopril and Lasix have been held.   Other comorbidities include chronic diastolic CHF, hypertension, hyperlipidemia, type II DM (hemoglobin A1c 5.6)  Plan discussed with her son, [name], today.  He said he will have further discussion with family members and they will decide on what to do.  Diet Order            DIET - DYS 1 Room service appropriate? No; Fluid consistency: Thin  Diet effective now                    Consultants:  General surgeon  Palliative care  Procedures:  Surgical debridement of sacral decubitus ulcer on 07/22/2020    Medications:   . allopurinol  100 mg Oral Daily  . amLODipine  5 mg Oral Daily  . vitamin C  250 mg Oral BID  . Chlorhexidine Gluconate Cloth  6 each Topical Daily  . feeding supplement  237 mL Oral TID BM  . insulin aspart  0-6 Units Subcutaneous Q4H  . mirtazapine  15 mg Oral QHS  . multivitamin with minerals  1 tablet Oral Daily   Continuous Infusions: . ampicillin-sulbactam (UNASYN) IV 3 g (08/02/20 0511)  . dextrose 5 % and 0.45% NaCl    . doxycycline (VIBRAMYCIN)  IV 100 mg (08/02/20 1238)     Anti-infectives (From admission, onward)   Start     Dose/Rate Route Frequency Ordered Stop   07/31/20 1000  doxycycline (VIBRAMYCIN) 100 mg in sodium chloride 0.9 % 250 mL IVPB        100 mg 125 mL/hr over 120 Minutes Intravenous Every 12 hours 07/30/20 1842     07/27/20 1800  vancomycin (VANCOREADY) IVPB 750 mg/150 mL  Status:  Discontinued        750 mg 150 mL/hr over 60 Minutes Intravenous Every 24 hours 07/26/20 1500 07/30/20 1842   07/26/20 1800   Ampicillin-Sulbactam (UNASYN) 3 g in sodium chloride 0.9 % 100 mL IVPB        3 g 200 mL/hr over 30 Minutes Intravenous Every 6 hours 07/26/20 1522     07/26/20 1700  vancomycin (VANCOREADY) IVPB 2000 mg/400 mL        2,000 mg 200 mL/hr over 120 Minutes Intravenous  Once 07/26/20 1500 07/26/20 2020   07/26/20 1600  clindamycin (CLEOCIN) IVPB 600 mg  Status:  Discontinued        600 mg 100 mL/hr over 30 Minutes Intravenous Every 8 hours 07/26/20 1500 07/26/20 1522   07/25/20 1200  Ampicillin-Sulbactam (UNASYN) 3 g in sodium chloride 0.9 % 100 mL IVPB  Status:  Discontinued        3 g 200 mL/hr over 30 Minutes Intravenous Every 6 hours 07/25/20 0725 07/26/20 1500   07/21/20 1400  Ampicillin-Sulbactam (UNASYN) 3 g in sodium chloride 0.9 % 100 mL IVPB  Status:  Discontinued        3 g 200 mL/hr over 30 Minutes Intravenous Every 12 hours 07/21/20 1308 07/25/20 0725   07/19/20 0600  cefTRIAXone (ROCEPHIN) 2 g in sodium chloride 0.9 % 100 mL IVPB  Status:  Discontinued        2 g 200 mL/hr over 30 Minutes Intravenous Every 24 hours 07/18/20 2356 07/21/20 1306   07/19/20 0000  azithromycin (ZITHROMAX) 500 mg in sodium chloride 0.9 % 250 mL IVPB  Status:  Discontinued        500 mg 250 mL/hr over 60 Minutes Intravenous Every 24 hours 07/18/20 2356 07/21/20 1306   07/18/20 1900  vancomycin (VANCOCIN) IVPB 1000 mg/200 mL premix        1,000 mg 200 mL/hr over 60 Minutes Intravenous  Once 07/18/20 1849 07/18/20 2254   07/18/20 1900  ceFEPIme (MAXIPIME) 2 g in sodium chloride 0.9 % 100 mL IVPB        2 g 200 mL/hr over 30 Minutes Intravenous  Once 07/18/20 1849 07/18/20 2104             Family Communication/Anticipated D/C date and plan/Code Status   DVT prophylaxis: SCDs Start: 07/19/20 1638     Code Status: DNR  Family Communication: None Disposition Plan:    Status is: Inpatient  Remains inpatient appropriate because:IV treatments appropriate due to intensity of illness or  inability to take PO   Dispo: The patient is from: Home              Anticipated d/c is to: SNF              Anticipated d/c date is: 3 days              Patient currently is not medically stable to d/c.   Difficult to place patient No           Subjective:  Interval events noted.  Her nurse, Celine, was at the bedside  Objective:    Vitals:   08/01/20 1537 08/01/20 2009 08/02/20 0425 08/02/20 1159  BP: (!) 153/62 (!) 167/86 96/84 (!) 147/76  Pulse: 78 83 93 74  Resp: 18 20 18 16   Temp: 98.3 F (36.8 C) 98.6 F (37 C) 99 F (37.2 C) 99.2 F (37.3 C)  TempSrc: Oral Oral Oral Oral  SpO2: 95% 96% 94% 98%  Weight:      Height:       No data found.   Intake/Output Summary (Last 24 hours) at 08/02/2020 1515 Last data filed at 08/02/2020 0800 Gross per 24 hour  Intake 0 ml  Output 1350 ml  Net -1350 ml   Filed Weights   07/18/20 1603  Weight: 98 kg    Exam:  GEN: NAD SKIN: Stage IV sacral decubitus ulcer, unstageable left heel decubitus ulcer  EYES: No pallor or icterus ENT: MMM CV: RRR PULM: CTA B ABD: soft, ND, NT, +BS CNS: Alert and oriented to person and place EXT: No edema or tenderness             Data Reviewed:   I have personally reviewed following labs and imaging studies:  Labs: Labs show the following:   Basic Metabolic Panel: Recent Labs  Lab 07/27/20 0602 07/27/20 0602 07/28/20 0411 07/29/20 0456 07/30/20 0358 07/31/20 0340 08/01/20 0420 08/02/20 0509  NA  --   --  145 144  --  147* 146* 142  K  --    < > 2.9* 3.1* 3.7 3.4* 3.9 5.1  CL  --   --  107 109  --  114* 115* 113*  CO2  --   --  25 27  --  25 24 22   GLUCOSE  --   --  98 106*  --  120* 107* 121*  BUN  --   --  28* 24*  --  19 18 17   CREATININE 1.11*  --  1.14* 0.99 1.05* 1.03* 0.97 1.17*  CALCIUM  --   --  9.0 8.8*  --  8.9 8.6* 9.1  MG 1.8  --  1.8 2.3 2.0 1.9  --   --   PHOS  --   --   --   --  2.4* 2.8  --   --    < > = values in this interval  not displayed.   GFR Estimated Creatinine Clearance: 38.5 mL/min (A) (by C-G formula based on SCr of 1.17 mg/dL (H)). Liver Function Tests: No results for input(s): AST, ALT, ALKPHOS, BILITOT, PROT, ALBUMIN in the last 168 hours. No results for input(s): LIPASE, AMYLASE in the last 168 hours. No results for input(s): AMMONIA in the last 168 hours. Coagulation profile No results for input(s): INR, PROTIME in the last 168 hours.  CBC: Recent Labs  Lab 07/27/20 0602 07/28/20 0411  WBC 9.8 10.4  NEUTROABS 6.8 7.3  HGB 10.1* 9.6*  HCT 32.4* 30.9*  MCV 87.1 86.6  PLT 306 325   Cardiac Enzymes: No results for input(s): CKTOTAL, CKMB, CKMBINDEX, TROPONINI in the last 168 hours. BNP (last 3 results) No results for input(s): PROBNP in the last 8760 hours. CBG: Recent Labs  Lab 08/01/20 2103 08/01/20 2344 08/02/20 0346 08/02/20 0757 08/02/20 1200  GLUCAP 125* 125* 118* 127* 127*   D-Dimer: No results for input(s): DDIMER in the last 72 hours. Hgb A1c: No results for input(s): HGBA1C in the last 72 hours.  Lipid Profile: No results for input(s): CHOL, HDL, LDLCALC, TRIG, CHOLHDL, LDLDIRECT in the last 72 hours. Thyroid function studies: No results for input(s): TSH, T4TOTAL, T3FREE, THYROIDAB in the last 72 hours.  Invalid input(s): FREET3 Anemia work up: No results for input(s): VITAMINB12, FOLATE, FERRITIN, TIBC, IRON, RETICCTPCT in the last 72 hours. Sepsis Labs: Recent Labs  Lab 07/27/20 0602 07/28/20 0411  WBC 9.8 10.4    Microbiology No results found for this or any previous visit (from the past 240 hour(s)).  Procedures and diagnostic studies:  No results found.             LOS: 15 days   [place] Copywriter, advertising on [email or web address]. If 7PM-7AM, please contact night-coverage at [email or web address]     08/02/2020, 3:15 PM

## 2020-08-03 DIAGNOSIS — N179 Acute kidney failure, unspecified: Secondary | ICD-10-CM | POA: Diagnosis not present

## 2020-08-03 DIAGNOSIS — G9341 Metabolic encephalopathy: Secondary | ICD-10-CM | POA: Diagnosis not present

## 2020-08-03 DIAGNOSIS — Z515 Encounter for palliative care: Secondary | ICD-10-CM | POA: Diagnosis not present

## 2020-08-03 DIAGNOSIS — I5032 Chronic diastolic (congestive) heart failure: Secondary | ICD-10-CM | POA: Diagnosis not present

## 2020-08-03 DIAGNOSIS — Z7189 Other specified counseling: Secondary | ICD-10-CM | POA: Diagnosis not present

## 2020-08-03 DIAGNOSIS — E43 Unspecified severe protein-calorie malnutrition: Secondary | ICD-10-CM | POA: Diagnosis not present

## 2020-08-03 LAB — CBC WITH DIFFERENTIAL/PLATELET
Abs Immature Granulocytes: 0.06 10*3/uL (ref 0.00–0.07)
Basophils Absolute: 0.1 10*3/uL (ref 0.0–0.1)
Basophils Relative: 1 %
Eosinophils Absolute: 0.3 10*3/uL (ref 0.0–0.5)
Eosinophils Relative: 4 %
HCT: 29.1 % — ABNORMAL LOW (ref 36.0–46.0)
Hemoglobin: 9.3 g/dL — ABNORMAL LOW (ref 12.0–15.0)
Immature Granulocytes: 1 %
Lymphocytes Relative: 21 %
Lymphs Abs: 1.6 10*3/uL (ref 0.7–4.0)
MCH: 27.9 pg (ref 26.0–34.0)
MCHC: 32 g/dL (ref 30.0–36.0)
MCV: 87.4 fL (ref 80.0–100.0)
Monocytes Absolute: 0.8 10*3/uL (ref 0.1–1.0)
Monocytes Relative: 10 %
Neutro Abs: 4.9 10*3/uL (ref 1.7–7.7)
Neutrophils Relative %: 63 %
Platelets: 328 10*3/uL (ref 150–400)
RBC: 3.33 MIL/uL — ABNORMAL LOW (ref 3.87–5.11)
RDW: 20.4 % — ABNORMAL HIGH (ref 11.5–15.5)
WBC: 7.8 10*3/uL (ref 4.0–10.5)
nRBC: 0 % (ref 0.0–0.2)

## 2020-08-03 LAB — GLUCOSE, CAPILLARY
Glucose-Capillary: 102 mg/dL — ABNORMAL HIGH (ref 70–99)
Glucose-Capillary: 115 mg/dL — ABNORMAL HIGH (ref 70–99)
Glucose-Capillary: 115 mg/dL — ABNORMAL HIGH (ref 70–99)
Glucose-Capillary: 116 mg/dL — ABNORMAL HIGH (ref 70–99)
Glucose-Capillary: 94 mg/dL (ref 70–99)
Glucose-Capillary: 99 mg/dL (ref 70–99)

## 2020-08-03 LAB — BASIC METABOLIC PANEL
Anion gap: 7 (ref 5–15)
BUN: 15 mg/dL (ref 8–23)
CO2: 22 mmol/L (ref 22–32)
Calcium: 9.1 mg/dL (ref 8.9–10.3)
Chloride: 110 mmol/L (ref 98–111)
Creatinine, Ser: 1.01 mg/dL — ABNORMAL HIGH (ref 0.44–1.00)
GFR, Estimated: 54 mL/min — ABNORMAL LOW (ref >60)
Glucose, Bld: 105 mg/dL — ABNORMAL HIGH (ref 70–99)
Potassium: 4.1 mmol/L (ref 3.5–5.1)
Sodium: 139 mmol/L (ref 135–145)

## 2020-08-03 LAB — PHOSPHORUS: Phosphorus: 2.7 mg/dL (ref 2.5–4.6)

## 2020-08-03 LAB — MAGNESIUM: Magnesium: 1.5 mg/dL — ABNORMAL LOW (ref 1.7–2.4)

## 2020-08-03 MED ORDER — MAGNESIUM SULFATE 2 GM/50ML IV SOLN
2.0000 g | Freq: Once | INTRAVENOUS | Status: AC
  Administered 2020-08-03: 2 g via INTRAVENOUS
  Filled 2020-08-03: qty 50

## 2020-08-03 NOTE — Progress Notes (Signed)
Progress Note    [name]  KGM:[id number] DOB: 05-22-1930  DOA: 07/18/2020 PCP: Biagio Borg, MD      Brief Narrative:    Medical records reviewed and are as summarized below:  [name] is a 87 y.o. female       Assessment/Plan:   Active Problems:   Diabetes ([place])   Hyperlipidemia   Essential hypertension   Malignant neoplasm of upper-outer quadrant of right breast in female, estrogen receptor positive ([place])   Wound healing, delayed   AKI (acute kidney injury) ([place])   CKD (chronic kidney disease) stage 3, GFR 30-59 ml/min ([name])   Chronic diastolic CHF (congestive heart failure) ([place])   Protein-calorie malnutrition, severe ([name])   Dehydration   Hyperkalemia   Acute metabolic encephalopathy   Sacral decubitus ulcer, stage IV ([place])   Nutrition Problem: Increased nutrient needs Etiology: wound healing  Signs/Symptoms: estimated needs   Body mass index is 35.95 kg/m.  (Morbid obesity)   Acute metabolic encephalopathy Suspected hospital-acquired delirium No further inpatient intervention per neurosurgeon. Antipsychotics as needed for agitation.   Stage IV sacral decubitus ulcer, unstageable left heel decubitus ulcer Status post debridement on 07/22/2020 Wound VAC tubing was disconnected on 07/29/2020 around 3 AM.   Wound VAC was replaced by wound care nurse on 07/29/2020. General surgeon has signed off. Wound growing VRE and MRSA Patient is not a candidate for diverting colostomy per general surgeon. Continue IV antibiotics and analgesics. Plan to transition to Augmentin on doxycycline for 2 weeks at discharge. UTI and pneumonia ruled out. Urine culture showed Enterococcus faecalis but per ID this is likely a contaminant.  Severe protein-calorie malnutrition, poor oral intake Patient has labile mental status with agitation and combativeness.  There is some concern that she will remove NG tube or PEG tube and so these are not feasible at the  moment.  She is refusing to eat or drink.  Continue IV fluids for hydration.  Hypernatremia and hypokalemia Improved.  Hypomagnesemia Replete magnesium with IV magnesium sulfate  Acute intraventricular hemorrhage-Trace No further work-up from neurosurgeon.  AKI(acute kidney injury) ([place]) -appears to be prerenal-dehydration Lisinopril and Lasix have been held.   Other comorbidities include chronic diastolic CHF, hypertension, hyperlipidemia, type II DM (hemoglobin A1c 5.6)  Case was discussed with Crystal, nurse practitioner from palliative care.  She said she had spoken to [name] (patient's son) about patient's poor prognosis.  She said [name] said he preferred not to speak to her but preferred to speak to the attending.  I called and spoke to [name] today.  He said Crystal misunderstood her and that "I did not mean for you to call me".  I reiterated the fact that patient has a poor prognosis. He said he and his family will continue to think about it and let us know what their decision is.    Diet Order            DIET - DYS 1 Room service appropriate? No; Fluid consistency: Thin  Diet effective now                    Consultants:  General surgeon  Palliative care  Procedures:  Surgical debridement of sacral decubitus ulcer on 07/22/2020    Medications:   . allopurinol  100 mg Oral Daily  . amLODipine  5 mg Oral Daily  . vitamin C  250 mg Oral BID  . Chlorhexidine Gluconate Cloth  6 each Topical Daily  .  feeding supplement  237 mL Oral TID BM  . insulin aspart  0-6 Units Subcutaneous Q4H  . mirtazapine  15 mg Oral QHS  . multivitamin with minerals  1 tablet Oral Daily   Continuous Infusions: . ampicillin-sulbactam (UNASYN) IV 3 g (08/03/20 0551)  . dextrose 5 % and 0.45% NaCl 50 mL/hr at 08/02/20 1702  . doxycycline (VIBRAMYCIN) IV 100 mg (08/03/20 1008)  . magnesium sulfate bolus IVPB       Anti-infectives (From admission, onward)   Start     Dose/Rate  Route Frequency Ordered Stop   07/31/20 1000  doxycycline (VIBRAMYCIN) 100 mg in sodium chloride 0.9 % 250 mL IVPB        100 mg 125 mL/hr over 120 Minutes Intravenous Every 12 hours 07/30/20 1842     07/27/20 1800  vancomycin (VANCOREADY) IVPB 750 mg/150 mL  Status:  Discontinued        750 mg 150 mL/hr over 60 Minutes Intravenous Every 24 hours 07/26/20 1500 07/30/20 1842   07/26/20 1800  Ampicillin-Sulbactam (UNASYN) 3 g in sodium chloride 0.9 % 100 mL IVPB        3 g 200 mL/hr over 30 Minutes Intravenous Every 6 hours 07/26/20 1522     07/26/20 1700  vancomycin (VANCOREADY) IVPB 2000 mg/400 mL        2,000 mg 200 mL/hr over 120 Minutes Intravenous  Once 07/26/20 1500 07/26/20 2020   07/26/20 1600  clindamycin (CLEOCIN) IVPB 600 mg  Status:  Discontinued        600 mg 100 mL/hr over 30 Minutes Intravenous Every 8 hours 07/26/20 1500 07/26/20 1522   07/25/20 1200  Ampicillin-Sulbactam (UNASYN) 3 g in sodium chloride 0.9 % 100 mL IVPB  Status:  Discontinued        3 g 200 mL/hr over 30 Minutes Intravenous Every 6 hours 07/25/20 0725 07/26/20 1500   07/21/20 1400  Ampicillin-Sulbactam (UNASYN) 3 g in sodium chloride 0.9 % 100 mL IVPB  Status:  Discontinued        3 g 200 mL/hr over 30 Minutes Intravenous Every 12 hours 07/21/20 1308 07/25/20 0725   07/19/20 0600  cefTRIAXone (ROCEPHIN) 2 g in sodium chloride 0.9 % 100 mL IVPB  Status:  Discontinued        2 g 200 mL/hr over 30 Minutes Intravenous Every 24 hours 07/18/20 2356 07/21/20 1306   07/19/20 0000  azithromycin (ZITHROMAX) 500 mg in sodium chloride 0.9 % 250 mL IVPB  Status:  Discontinued        500 mg 250 mL/hr over 60 Minutes Intravenous Every 24 hours 07/18/20 2356 07/21/20 1306   07/18/20 1900  vancomycin (VANCOCIN) IVPB 1000 mg/200 mL premix        1,000 mg 200 mL/hr over 60 Minutes Intravenous  Once 07/18/20 1849 07/18/20 2254   07/18/20 1900  ceFEPIme (MAXIPIME) 2 g in sodium chloride 0.9 % 100 mL IVPB        2 g 200  mL/hr over 30 Minutes Intravenous  Once 07/18/20 1849 07/18/20 2104             Family Communication/Anticipated D/C date and plan/Code Status   DVT prophylaxis: SCDs Start: 07/19/20 1638     Code Status: DNR  Family Communication: Discussed with her son, [name] Disposition Plan:    Status is: Inpatient  Remains inpatient appropriate because:IV treatments appropriate due to intensity of illness or inability to take PO   Dispo: The patient is from: Home  Anticipated d/c is to: SNF              Anticipated d/c date is: 3 days              Patient currently is not medically stable to d/c.   Difficult to place patient No           Subjective:   Interval events noted.  She is unable to provide any history.  Objective:    Vitals:   08/03/20 0001 08/03/20 0435 08/03/20 0946 08/03/20 1103  BP: (!) 148/73 (!) 153/67 (!) 146/60 (!) 152/98  Pulse: 75 83 64 80  Resp: 18 16 18 18   Temp: 97.6 F (36.4 C) 98.3 F (36.8 C) 98 F (36.7 C)   TempSrc: Oral Oral Oral   SpO2: 95% 97% 98% 98%  Weight:      Height:       No data found.   Intake/Output Summary (Last 24 hours) at 08/03/2020 1220 Last data filed at 08/03/2020 0834 Gross per 24 hour  Intake 1306.27 ml  Output 2350 ml  Net -1043.73 ml   Filed Weights   07/18/20 1603  Weight: 98 kg    Exam:  GEN: NAD SKIN: Stage IV sacral decubitus ulcer, unstageable left heel decubitus ulcer EYES: No pallor or icterus ENT: MMM CV: RRR PULM: No rales or wheezing heard ABD: soft, obese, NT, +BS CNS: Sleepy but arousable.  Confused. EXT: No edema or tenderness            Data Reviewed:   I have personally reviewed following labs and imaging studies:  Labs: Labs show the following:   Basic Metabolic Panel: Recent Labs  Lab 07/28/20 0411 07/29/20 0456 07/30/20 0358 07/31/20 0340 08/01/20 0420 08/02/20 0509 08/03/20 0509  NA 145 144  --  147* 146* 142 139  K 2.9* 3.1* 3.7 3.4*  3.9 5.1 4.1  CL 107 109  --  114* 115* 113* 110  CO2 25 27  --  25 24 22 22   GLUCOSE 98 106*  --  120* 107* 121* 105*  BUN 28* 24*  --  19 18 17 15   CREATININE 1.14* 0.99 1.05* 1.03* 0.97 1.17* 1.01*  CALCIUM 9.0 8.8*  --  8.9 8.6* 9.1 9.1  MG 1.8 2.3 2.0 1.9  --   --  1.5*  PHOS  --   --  2.4* 2.8  --   --  2.7   GFR Estimated Creatinine Clearance: 44.6 mL/min (A) (by C-G formula based on SCr of 1.01 mg/dL (H)). Liver Function Tests: No results for input(s): AST, ALT, ALKPHOS, BILITOT, PROT, ALBUMIN in the last 168 hours. No results for input(s): LIPASE, AMYLASE in the last 168 hours. No results for input(s): AMMONIA in the last 168 hours. Coagulation profile No results for input(s): INR, PROTIME in the last 168 hours.  CBC: Recent Labs  Lab 07/28/20 0411 08/03/20 0509  WBC 10.4 7.8  NEUTROABS 7.3 4.9  HGB 9.6* 9.3*  HCT 30.9* 29.1*  MCV 86.6 87.4  PLT 325 328   Cardiac Enzymes: No results for input(s): CKTOTAL, CKMB, CKMBINDEX, TROPONINI in the last 168 hours. BNP (last 3 results) No results for input(s): PROBNP in the last 8760 hours. CBG: Recent Labs  Lab 08/02/20 2020 08/03/20 0041 08/03/20 0428 08/03/20 0734 08/03/20 1128  GLUCAP 100* 115* 99 94 116*   D-Dimer: No results for input(s): DDIMER in the last 72 hours. Hgb A1c: No results for input(s): HGBA1C in the last  72 hours. Lipid Profile: No results for input(s): CHOL, HDL, LDLCALC, TRIG, CHOLHDL, LDLDIRECT in the last 72 hours. Thyroid function studies: No results for input(s): TSH, T4TOTAL, T3FREE, THYROIDAB in the last 72 hours.  Invalid input(s): FREET3 Anemia work up: No results for input(s): VITAMINB12, FOLATE, FERRITIN, TIBC, IRON, RETICCTPCT in the last 72 hours. Sepsis Labs: Recent Labs  Lab 07/28/20 0411 08/03/20 0509  WBC 10.4 7.8    Microbiology No results found for this or any previous visit (from the past 240 hour(s)).  Procedures and diagnostic studies:  No results  found.             LOS: 16 days   Portia Copywriter, advertising on [email or web address]. If 7PM-7AM, please contact night-coverage at [email or web address]     08/03/2020, 12:20 PM

## 2020-08-03 NOTE — Consult Note (Signed)
[place] Nurse wound follow up Wound type:Stage 4 sacral pressure injury.  NPWT in place.  Changed Mon /W/F Patient is premedicated prior to procedure.  Measurement: 8 cm x 16 cm x 2.3 cm  Wound bed:25 % bone, 60% pink nongranulating  15% beefy red Drainage (amount, consistency, odor) moderate serosanguinous   Periwound:Moist and denuded skin from 4 to 8 o'clock.  Protected with barrier ring.  Dressing procedure/placement/frequency:black foam to wound bed.  Seal immediately achieved. Change Mon/Wed/Fri Will follow.  Domenic Moras MSN, RN, FNP-BC CWON Wound, Ostomy, Continence Nurse Pager [phone]

## 2020-08-03 NOTE — Progress Notes (Addendum)
Daily Progress Note   Patient Name: [name]       Date: 08/03/2020 DOB: 19-Nov-1929  Age: 87 y.o. MRN#: [id number] Attending Physician: Jennye Boroughs, MD Primary Care Physician: Biagio Borg, MD Admit Date: 07/18/2020  Reason for Consultation/Follow-up: Establishing goals of care  Subjective: Patient is resting in bed with staff at bedside not interacting with staff. Spoke with [name]. Discussed her current status. He tells me about her lab work; discussed lab work within the context of the larger clinical picture. Discussed concerns for oral intake and refusal to eat. IV fluids in place.  He has been told she could remove a feeding tube, and I am in agreement with this. He states his father has not been brought to see her as family is concerned he would try to come by himself. Discussed poor prognosis. [name] states brother [name] has been visiting, the family is in a holding pattern and are working closely with the attending team and would like discussions and updates from them. He states the family has palliative team's phone number if they need palliative team in the future.   Returned to bedside after staff finished working with her. She moans. Asked her if she was having pain and she said "yes". Asked her if it was her bottom and she said "yes". Staff entered and are aware. She did not say anything further.          Length of Stay: 16  Current Medications: Scheduled Meds:  . allopurinol  100 mg Oral Daily  . amLODipine  5 mg Oral Daily  . vitamin C  250 mg Oral BID  . Chlorhexidine Gluconate Cloth  6 each Topical Daily  . feeding supplement  237 mL Oral TID BM  . insulin aspart  0-6 Units Subcutaneous Q4H  . mirtazapine  15 mg Oral QHS  . multivitamin with minerals  1 tablet Oral  Daily    Continuous Infusions: . ampicillin-sulbactam (UNASYN) IV 3 g (08/03/20 0551)  . dextrose 5 % and 0.45% NaCl 50 mL/hr at 08/02/20 1702  . doxycycline (VIBRAMYCIN) IV 100 mg (08/03/20 1008)  . magnesium sulfate bolus IVPB      PRN Meds: acetaminophen, albuterol, haloperidol **OR** haloperidol lactate, hydrALAZINE, HYDROcodone-acetaminophen, ipratropium-albuterol, morphine injection  Physical Exam Constitutional:      Comments: Eyes  closed.   Pulmonary:     Effort: Pulmonary effort is normal.  Skin:    General: Skin is warm and dry.             Vital Signs: BP (!) 146/60 (BP Location: Left Arm)   Pulse 64   Temp 98 F (36.7 C) (Oral)   Resp 18   Ht 5\' 5"  (1.651 m)   Wt 98 kg   SpO2 98%   BMI 35.95 kg/m  SpO2: SpO2: 98 % O2 Device: O2 Device: Room Air O2 Flow Rate: O2 Flow Rate (L/min): 2 L/min  Intake/output summary:   Intake/Output Summary (Last 24 hours) at 08/03/2020 1024 Last data filed at 08/03/2020 7564 Gross per 24 hour  Intake 1306.27 ml  Output 2350 ml  Net -1043.73 ml   LBM: Last BM Date: 08/02/20 Baseline Weight: Weight: 98 kg Most recent weight: Weight: 98 kg         Patient Active Problem List   Diagnosis Date Noted  . Sacral decubitus ulcer, stage IV ([place]) 07/22/2020  . Dehydration 07/18/2020  . Hyperkalemia 07/18/2020  . Acute metabolic encephalopathy 33/29/5188  . Closed burst fracture of lumbar vertebra ([place]) 04/03/2020  . Acute respiratory failure with hypoxia and hypercapnia ([place]) 04/03/2020  . ATN (acute tubular necrosis) ([place]) 04/03/2020  . H/O bad fall 04/03/2020  . HCAP (healthcare-associated pneumonia) 04/03/2020  . Hematochezia 05/29/2019  . Diverticulosis   . Swelling of vagina 04/16/2019  . Skin lesion 03/26/2019  . Left leg cellulitis 01/15/2019  . Healed diabetic foot ulcer 12/01/2017  . Abnormal TSH 11/15/2016  . Preventative health care 04/20/2016  . Allergic rhinitis 04/20/2016  . Dysuria 12/09/2014  .  External hemorrhoids without complication 41/66/0630  . Left ear pain 07/08/2014  . Hip pain   . GIB (gastrointestinal bleeding) 06/18/2014  . UTI (lower urinary tract infection) 06/18/2014  . Abdominal pain   . Lower GI bleed   . Arthritis 02/18/2014  . Enteritis due to Clostridium difficile 10/12/2013  . CKD (chronic kidney disease) stage 3, GFR 30-59 ml/min ([name]) 10/11/2013  . Chronic diastolic CHF (congestive heart failure) ([place]) 10/11/2013  . Protein-calorie malnutrition, severe ([place]) 10/11/2013  . Hx of necrotizing fasciitis 10/10/2013  . GI bleed 10/10/2013  . AKI (acute kidney injury) ([place]) 10/10/2013  . Cough 09/10/2013  . Rectal bleeding 09/10/2013  . Acute on chronic diastolic CHF (congestive heart failure) ([place]) 08/31/2013  . Rash and nonspecific skin eruption 08/27/2013  . Pulmonary edema 08/25/2013  . Severe sepsis with septic shock ([place]) 08/13/2013  . Necrotizing fasciitis of perineum 08/12/2013  . Acute on chronic kidney disease, stage 3 08/12/2013  . Wheezing 06/07/2013  . Wound healing, delayed 01/26/2012  . Diverticulosis of colon with hemorrhage 12/19/2011  . Acute blood loss anemia 12/19/2011  . Malignant neoplasm of upper-outer quadrant of right breast in female, estrogen receptor positive ([place]) 08/01/2011  . Breast cancer, right breast-lobular s/p lumpectomy and sentinel node biopsy 06/30/2011  . Shingles 02/21/2011  . Bile duct calculus with nonacute cholecystitis 01/21/2011  . Morbid obesity ([place]) 01/21/2011  . TREMOR 08/17/2010  . Diabetes ([place]) 01/09/2008  . PERIPHERAL NEUROPATHY 10/17/2007  . FATIGUE 10/17/2007  . COLONIC POLYPS, HX OF 02/12/2007  . Hyperlipidemia 02/10/2007  . Gout 02/10/2007  . ANXIETY 02/10/2007  . Essential hypertension 02/10/2007  . CVA 02/10/2007  . DIVERTICULOSIS, COLON 02/10/2007  . IRRITABLE BOWEL, PREDOMINANTLY CONSTIPATION 02/10/2007    Palliative Care Assessment & Plan      Assessment/Recommendations/Plan:  Currently, patient has a prognosis of days to weeks. Family prefers all conversations and updates from attending team.      Code Status:    Code Status Orders  (From admission, onward)         Start     Ordered   07/24/20 1054  Do not attempt resuscitation (DNR)  Continuous       Question Answer Comment  In the event of cardiac or respiratory ARREST Do not call a "code blue"   In the event of cardiac or respiratory ARREST Do not perform Intubation, CPR, defibrillation or ACLS   In the event of cardiac or respiratory ARREST Use medication by any route, position, wound care, and other measures to relive pain and suffering. May use oxygen, suction and manual treatment of airway obstruction as needed for comfort.      07/24/20 1053        Code Status History    Date Active Date Inactive Code Status Order ID Comments User Context   07/23/2020 1423 07/24/2020 1053 Full Code [id number]  Asencion Gowda, NP Inpatient   07/23/2020 1422 07/23/2020 1423 DNR [id number]  Asencion Gowda, NP Inpatient   07/18/2020 2348 07/23/2020 1422 Full Code [id number]  Toy Baker, MD ED   05/29/2019 1607 05/31/2019 1658 Full Code [id number]  Reubin Milan, MD ED   06/18/2014 0523 06/23/2014 1843 Full Code [id number]  Shanda Howells, MD ED   10/10/2013 1945 10/14/2013 1330 Full Code [id number]  Theressa Millard, MD ED   08/12/2013 2033 08/29/2013 1722 Full Code [id number]  Janece Canterbury, MD Inpatient   12/19/2011 1222 12/21/2011 1601 Full Code [id number]  Clearence Ped, RN Inpatient   06/29/2011 1541 07/01/2011 1618 Full Code [id number]  Pedro Earls, MD Inpatient   Advance Care Planning Activity    Advance Directive Documentation   Flowsheet Row Most Recent Value  Type of Advance Directive Healthcare Power of Attorney  Pre-existing out of facility DNR order (yellow form or pink MOST form) --  "MOST" Form in Place? --      Prognosis:  < 2 weeks    Care  plan was discussed with RN  Thank you for allowing the Palliative Medicine Team to assist in the care of this patient.   Total Time 15 min Prolonged Time Billed  no      Greater than 50%  of this time was spent counseling and coordinating care related to the above assessment and plan.  Asencion Gowda, NP  Please contact Palliative Medicine Team phone at [phone] for questions and concerns.

## 2020-08-03 NOTE — TOC Progression Note (Signed)
Transition of Care [facility]) - Progression Note    Patient Details  Name: [name] MRN: [id number] Date of Birth: 1930/05/23  [facility] CM/SW Contact  Beverly Sessions, RN Phone Number: 08/03/2020, 2:20 PM  Clinical Narrative:    Per palliative and MD poor prognosis.  Currently not eating or drinking    Expected Discharge Plan: [place] Barriers to Discharge: Continued Medical Work up  Expected Discharge Plan and Services Expected Discharge Plan: [place]                                               Social Determinants of Health (SDOH) Interventions    Readmission Risk Interventions Readmission Risk Prevention Plan 07/20/2020  Transportation Screening Complete  Social Work Consult for [place] Planning/Counseling Complete  Medication Review Press photographer) Complete  Some recent data might be hidden

## 2020-08-03 NOTE — Plan of Care (Signed)
  Problem: Clinical Measurements: Goal: Will remain free from infection Outcome: Not Progressing   Problem: Clinical Measurements: Goal: Diagnostic test results will improve Outcome: Not Progressing  Patient only taking sips of drink .  Wound nurse changed wound vac dressing today. Patient given pain medication with dressing change.

## 2020-08-04 DIAGNOSIS — E43 Unspecified severe protein-calorie malnutrition: Secondary | ICD-10-CM | POA: Diagnosis not present

## 2020-08-04 DIAGNOSIS — L89154 Pressure ulcer of sacral region, stage 4: Secondary | ICD-10-CM | POA: Diagnosis not present

## 2020-08-04 DIAGNOSIS — G9341 Metabolic encephalopathy: Secondary | ICD-10-CM | POA: Diagnosis not present

## 2020-08-04 DIAGNOSIS — I5032 Chronic diastolic (congestive) heart failure: Secondary | ICD-10-CM | POA: Diagnosis not present

## 2020-08-04 LAB — BASIC METABOLIC PANEL
Anion gap: 4 — ABNORMAL LOW (ref 5–15)
BUN: 13 mg/dL (ref 8–23)
CO2: 25 mmol/L (ref 22–32)
Calcium: 8.9 mg/dL (ref 8.9–10.3)
Chloride: 109 mmol/L (ref 98–111)
Creatinine, Ser: 0.97 mg/dL (ref 0.44–1.00)
GFR, Estimated: 56 mL/min — ABNORMAL LOW (ref >60)
Glucose, Bld: 103 mg/dL — ABNORMAL HIGH (ref 70–99)
Potassium: 4.1 mmol/L (ref 3.5–5.1)
Sodium: 138 mmol/L (ref 135–145)

## 2020-08-04 LAB — GLUCOSE, CAPILLARY
Glucose-Capillary: 100 mg/dL — ABNORMAL HIGH (ref 70–99)
Glucose-Capillary: 104 mg/dL — ABNORMAL HIGH (ref 70–99)
Glucose-Capillary: 118 mg/dL — ABNORMAL HIGH (ref 70–99)
Glucose-Capillary: 119 mg/dL — ABNORMAL HIGH (ref 70–99)
Glucose-Capillary: 124 mg/dL — ABNORMAL HIGH (ref 70–99)
Glucose-Capillary: 83 mg/dL (ref 70–99)
Glucose-Capillary: 93 mg/dL (ref 70–99)
Glucose-Capillary: 94 mg/dL (ref 70–99)

## 2020-08-04 LAB — MAGNESIUM: Magnesium: 2 mg/dL (ref 1.7–2.4)

## 2020-08-04 NOTE — Progress Notes (Addendum)
Progress Note    [name]  JSE:[id number] DOB: [birth year]  DOA: 07/18/2020 PCP: Biagio Borg, MD      Brief Narrative:    Medical records reviewed and are as summarized below:  My A Monts is a 87 y.o. female with medical history significant of dementia, diastolic CHF, s/p Lumbar fusion due to L3 frx and repair of traumatic CSF leakOct 2021,anemia, atrial flutter, breast cancer status post radiation therapy, CKD stage III, history of CVA, history of DVT left leg postoperatively, esophageal stricture,GERD, C. difficile infection,severe osteopenia,falls, stage IV decubitus ulcer, unstageable left heel decubitus ulcer.  She was brought to the hospital because of hypotension, decreased urine output, confusion and somnolence.  She was admitted to the hospital for acute kidney injury, hypotension and acute metabolic encephalopathy.  She also has decubitus ulcer that was infected.  General surgery was consulted and she underwent debridement of sacral wound on 07/22/2020.  She was treated with empiric IV antibiotics.  ID was consulted to assist with management.  She was also given IV fluids for hydration, AKI and hypotension  She was found to have acute, small intraventricular hemorrhage.  Neurosurgery was consulted for this.  Repeat CT head showed stability of intraventricular hemorrhage so no further treatment of intraventricular hemorrhage was recommended.  He had electrolyte abnormalities including hyponatremia, hypokalemia and hypomagnesemia that have been treated.  She has severe protein calorie malnutrition and poor oral intake.  She has been refusing to eat or drink for the most part during this admission.  She has dementia with behavioral issues characterized by intermittent agitation and combativeness. Palliative care was consulted to assist with goals of care.  Comfort care was being contemplated.  However, family has not been able to make any decision regarding comfort  measures.     Assessment/Plan:   Active Problems:   Diabetes ([place])   Hyperlipidemia   Essential hypertension   Malignant neoplasm of upper-outer quadrant of right breast in female, estrogen receptor positive ([place])   Wound healing, delayed   AKI (acute kidney injury) ([place])   CKD (chronic kidney disease) stage 3, GFR 30-59 ml/min ([name])   Chronic diastolic CHF (congestive heart failure) ([name])   Protein-calorie malnutrition, severe ([name])   Dehydration   Hyperkalemia   Acute metabolic encephalopathy   Sacral decubitus ulcer, stage IV ([name])   Nutrition Problem: Increased nutrient needs Etiology: wound healing  Signs/Symptoms: estimated needs   Body mass index is 35.95 kg/m.  (Morbid obesity)   Acute metabolic encephalopathy Suspected hospital-acquired delirium No further inpatient intervention per neurosurgeon. Antipsychotics as needed for agitation.   Stage IV sacral decubitus ulcer, unstageable left heel decubitus ulcer Status post debridement on 07/22/2020 Wound VAC tubing was disconnected on 07/29/2020 around 3 AM.   Wound VAC was replaced by wound care nurse on 07/29/2020. General surgeon has signed off. Wound growing VRE and MRSA Patient is not a candidate for diverting colostomy per general surgeon. Continue IV antibiotics and analgesics. Plan to transition to Augmentin on doxycycline for 2 weeks at discharge. UTI and pneumonia ruled out. Urine culture showed Enterococcus faecalis but per ID this is likely a contaminant.  Severe protein-calorie malnutrition, poor oral intake Patient has labile mental status with agitation and combativeness.  There is some concern that she will remove NG tube or PEG tube and so these are not feasible at the moment.  She refuses to eat or drink.  She is on IV fluids for hydration.  Hypernatremia and hypokalemia  Improved.  Hypomagnesemia Replete magnesium with IV magnesium sulfate  Acute intraventricular hemorrhage-Trace No  further work-up from neurosurgeon.  AKI(acute kidney injury) ([place]) -appears to be prerenal-dehydration Lisinopril and Lasix have been held.   Other comorbidities include chronic diastolic CHF, hypertension, hyperlipidemia, type II DM (hemoglobin A1c 5.6)  Case discussed with [name] (son) today.  He said their position is still the same.  He still wants [place] to continue current treatment.  They have not made a decision about comfort care yet.   Diet Order            DIET - DYS 1 Room service appropriate? No; Fluid consistency: Thin  Diet effective now                    Consultants:  General surgeon  Infectious disease  Neurosurgeon  Palliative care  Procedures:  Surgical debridement of sacral decubitus ulcer on 07/22/2020    Medications:   . allopurinol  100 mg Oral Daily  . amLODipine  5 mg Oral Daily  . vitamin C  250 mg Oral BID  . Chlorhexidine Gluconate Cloth  6 each Topical Daily  . feeding supplement  237 mL Oral TID BM  . insulin aspart  0-6 Units Subcutaneous Q4H  . mirtazapine  15 mg Oral QHS  . multivitamin with minerals  1 tablet Oral Daily   Continuous Infusions: . ampicillin-sulbactam (UNASYN) IV 3 g (08/04/20 0519)  . dextrose 5 % and 0.45% NaCl 50 mL/hr at 08/04/20 0924  . doxycycline (VIBRAMYCIN) IV 100 mg (08/04/20 0935)     Anti-infectives (From admission, onward)   Start     Dose/Rate Route Frequency Ordered Stop   07/31/20 1000  doxycycline (VIBRAMYCIN) 100 mg in sodium chloride 0.9 % 250 mL IVPB        100 mg 125 mL/hr over 120 Minutes Intravenous Every 12 hours 07/30/20 1842     07/27/20 1800  vancomycin (VANCOREADY) IVPB 750 mg/150 mL  Status:  Discontinued        750 mg 150 mL/hr over 60 Minutes Intravenous Every 24 hours 07/26/20 1500 07/30/20 1842   07/26/20 1800  Ampicillin-Sulbactam (UNASYN) 3 g in sodium chloride 0.9 % 100 mL IVPB        3 g 200 mL/hr over 30 Minutes Intravenous Every 6 hours 07/26/20 1522     07/26/20 1700   vancomycin (VANCOREADY) IVPB 2000 mg/400 mL        2,000 mg 200 mL/hr over 120 Minutes Intravenous  Once 07/26/20 1500 07/26/20 2020   07/26/20 1600  clindamycin (CLEOCIN) IVPB 600 mg  Status:  Discontinued        600 mg 100 mL/hr over 30 Minutes Intravenous Every 8 hours 07/26/20 1500 07/26/20 1522   07/25/20 1200  Ampicillin-Sulbactam (UNASYN) 3 g in sodium chloride 0.9 % 100 mL IVPB  Status:  Discontinued        3 g 200 mL/hr over 30 Minutes Intravenous Every 6 hours 07/25/20 0725 07/26/20 1500   07/21/20 1400  Ampicillin-Sulbactam (UNASYN) 3 g in sodium chloride 0.9 % 100 mL IVPB  Status:  Discontinued        3 g 200 mL/hr over 30 Minutes Intravenous Every 12 hours 07/21/20 1308 07/25/20 0725   07/19/20 0600  cefTRIAXone (ROCEPHIN) 2 g in sodium chloride 0.9 % 100 mL IVPB  Status:  Discontinued        2 g 200 mL/hr over 30 Minutes Intravenous Every 24 hours 07/18/20 2356  07/21/20 1306   07/19/20 0000  azithromycin (ZITHROMAX) 500 mg in sodium chloride 0.9 % 250 mL IVPB  Status:  Discontinued        500 mg 250 mL/hr over 60 Minutes Intravenous Every 24 hours 07/18/20 2356 07/21/20 1306   07/18/20 1900  vancomycin (VANCOCIN) IVPB 1000 mg/200 mL premix        1,000 mg 200 mL/hr over 60 Minutes Intravenous  Once 07/18/20 1849 07/18/20 2254   07/18/20 1900  ceFEPIme (MAXIPIME) 2 g in sodium chloride 0.9 % 100 mL IVPB        2 g 200 mL/hr over 30 Minutes Intravenous  Once 07/18/20 1849 07/18/20 2104             Family Communication/Anticipated D/C date and plan/Code Status   DVT prophylaxis: SCDs Start: 07/19/20 1638     Code Status: DNR  Family Communication: Discussed with her son, [name] Disposition Plan:    Status is: Inpatient  Remains inpatient appropriate because:IV treatments appropriate due to intensity of illness or inability to take PO   Dispo: The patient is from: Home              Anticipated d/c is to: SNF              Anticipated d/c date is: 3 days               Patient currently is not medically stable to d/c.   Difficult to place patient No           Subjective:   Interval events noted.  She is unable to provide any history.  Objective:    Vitals:   08/03/20 1852 08/04/20 0558 08/04/20 1150 08/04/20 1153  BP: (!) 146/117 (!) 158/73 (!) 209/163 (!) 147/101  Pulse:  75 64 67  Resp:  18    Temp:  98.2 F (36.8 C) 97.8 F (36.6 C)   TempSrc:  Oral    SpO2:  100% 97% 97%  Weight:      Height:       No data found.   Intake/Output Summary (Last 24 hours) at 08/04/2020 1205 Last data filed at 08/04/2020 0500 Gross per 24 hour  Intake 1050 ml  Output 1501 ml  Net -451 ml   Filed Weights   07/18/20 1603  Weight: 98 kg    Exam:  GEN: NAD SKIN: Stage IV sacral decubitus ulcer, unstageable left heel decubitus ulcer EYES: No pallor or icterus ENT: MMM CV: RRR PULM: CTA B ABD: soft, obese, NT, +BS CNS: Alert and oriented to person.  Non focal EXT: No edema or tenderness           Data Reviewed:   I have personally reviewed following labs and imaging studies:  Labs: Labs show the following:   Basic Metabolic Panel: Recent Labs  Lab 07/29/20 0456 07/30/20 0358 07/31/20 0340 08/01/20 0420 08/02/20 0509 08/03/20 0509 08/04/20 0435  NA 144  --  147* 146* 142 139 138  K 3.1* 3.7 3.4* 3.9 5.1 4.1 4.1  CL 109  --  114* 115* 113* 110 109  CO2 27  --  25 24 22 22 25   GLUCOSE 106*  --  120* 107* 121* 105* 103*  BUN 24*  --  19 18 17 15 13   CREATININE 0.99 1.05* 1.03* 0.97 1.17* 1.01* 0.97  CALCIUM 8.8*  --  8.9 8.6* 9.1 9.1 8.9  MG 2.3 2.0 1.9  --   --  1.5* 2.0  PHOS  --  2.4* 2.8  --   --  2.7  --    GFR Estimated Creatinine Clearance: 46.5 mL/min (by C-G formula based on SCr of 0.97 mg/dL). Liver Function Tests: No results for input(s): AST, ALT, ALKPHOS, BILITOT, PROT, ALBUMIN in the last 168 hours. No results for input(s): LIPASE, AMYLASE in the last 168 hours. No results for input(s):  AMMONIA in the last 168 hours. Coagulation profile No results for input(s): INR, PROTIME in the last 168 hours.  CBC: Recent Labs  Lab 08/03/20 0509  WBC 7.8  NEUTROABS 4.9  HGB 9.3*  HCT 29.1*  MCV 87.4  PLT 328   Cardiac Enzymes: No results for input(s): CKTOTAL, CKMB, CKMBINDEX, TROPONINI in the last 168 hours. BNP (last 3 results) No results for input(s): PROBNP in the last 8760 hours. CBG: Recent Labs  Lab 08/03/20 2006 08/04/20 0011 08/04/20 0516 08/04/20 0735 08/04/20 1141  GLUCAP 102* 124* 83 100* 93   D-Dimer: No results for input(s): DDIMER in the last 72 hours. Hgb A1c: No results for input(s): HGBA1C in the last 72 hours. Lipid Profile: No results for input(s): CHOL, HDL, LDLCALC, TRIG, CHOLHDL, LDLDIRECT in the last 72 hours. Thyroid function studies: No results for input(s): TSH, T4TOTAL, T3FREE, THYROIDAB in the last 72 hours.  Invalid input(s): FREET3 Anemia work up: No results for input(s): VITAMINB12, FOLATE, FERRITIN, TIBC, IRON, RETICCTPCT in the last 72 hours. Sepsis Labs: Recent Labs  Lab 08/03/20 0509  WBC 7.8    Microbiology No results found for this or any previous visit (from the past 240 hour(s)).  Procedures and diagnostic studies:  No results found.             LOS: 17 days   [employer], advertising on [email or web address]. If 7PM-7AM, please contact night-coverage at [email or web address]     08/04/2020, 12:05 PM

## 2020-08-05 DIAGNOSIS — N179 Acute kidney failure, unspecified: Secondary | ICD-10-CM | POA: Diagnosis not present

## 2020-08-05 DIAGNOSIS — N1831 Chronic kidney disease, stage 3a: Secondary | ICD-10-CM | POA: Diagnosis not present

## 2020-08-05 DIAGNOSIS — G9341 Metabolic encephalopathy: Secondary | ICD-10-CM | POA: Diagnosis not present

## 2020-08-05 DIAGNOSIS — I10 Essential (primary) hypertension: Secondary | ICD-10-CM | POA: Diagnosis not present

## 2020-08-05 LAB — MAGNESIUM: Magnesium: 1.8 mg/dL (ref 1.7–2.4)

## 2020-08-05 LAB — GLUCOSE, CAPILLARY
Glucose-Capillary: 96 mg/dL (ref 70–99)
Glucose-Capillary: 93 mg/dL (ref 70–99)
Glucose-Capillary: 126 mg/dL — ABNORMAL HIGH (ref 70–99)
Glucose-Capillary: 159 mg/dL — ABNORMAL HIGH (ref 70–99)
Glucose-Capillary: 90 mg/dL (ref 70–99)

## 2020-08-05 LAB — BASIC METABOLIC PANEL
Anion gap: 9 (ref 5–15)
BUN: 14 mg/dL (ref 8–23)
CO2: 19 mmol/L — ABNORMAL LOW (ref 22–32)
Calcium: 8.8 mg/dL — ABNORMAL LOW (ref 8.9–10.3)
Chloride: 109 mmol/L (ref 98–111)
Creatinine, Ser: 0.98 mg/dL (ref 0.44–1.00)
GFR, Estimated: 56 mL/min — ABNORMAL LOW (ref >60)
Glucose, Bld: 100 mg/dL — ABNORMAL HIGH (ref 70–99)
Potassium: 3.1 mmol/L — ABNORMAL LOW (ref 3.5–5.1)
Sodium: 137 mmol/L (ref 135–145)

## 2020-08-05 LAB — PHOSPHORUS: Phosphorus: 2.9 mg/dL (ref 2.5–4.6)

## 2020-08-05 MED ORDER — DOXYCYCLINE HYCLATE 100 MG PO TABS
100.0000 mg | ORAL_TABLET | Freq: Two times a day (BID) | ORAL | Status: DC
  Administered 2020-08-05 – 2020-08-06 (×3): 100 mg via ORAL
  Filled 2020-08-05 (×3): qty 1

## 2020-08-05 MED ORDER — POTASSIUM CHLORIDE 20 MEQ PO PACK
40.0000 meq | PACK | Freq: Two times a day (BID) | ORAL | Status: AC
  Administered 2020-08-05 (×2): 40 meq via ORAL
  Filled 2020-08-05 (×2): qty 2

## 2020-08-05 NOTE — TOC Progression Note (Signed)
Transition of Care [facility]) - Progression Note    Patient Details  Name: [name] MRN: [id number] Date of Birth: [birth year]  Transition [facility] CM/SW Contact  Beverly Sessions, RN Phone Number: 08/05/2020, 12:02 PM  Clinical Narrative:    MD to discuss plan of care and prognosis with family again today    Expected Discharge Plan: Skilled Nursing Facility Barriers to Discharge: Continued Medical Work up  Expected Discharge Plan and Services Expected Discharge Plan: [place]                                               Social Determinants of Health (SDOH) Interventions    Readmission Risk Interventions Readmission Risk Prevention Plan 07/20/2020  Transportation Screening Complete  Social Work Consult for [name] Planning/Counseling Complete  Medication Review [employer] photographer) Complete  Some recent data might be hidden

## 2020-08-05 NOTE — Progress Notes (Signed)
Son at bedside requesting to speak with case worker.  RN let him know they would be back in in am and would need to address discharge planning at that time.  Son requesting patient to stay in hospital and not be discharged to facility.  Rn assured him no discharge would have tonight due to no discharge orders on chart. Additionally son requesting pictures of wound which this Rn referred him to medical records.   Son signed consent for assessment/use of Right arm for IV access.

## 2020-08-05 NOTE — Progress Notes (Addendum)
[facility] [place] [facility] [place] NAME: [name]    MR#:  [id number]  DATE OF BIRTH:  1929-08-22  SUBJECTIVE:  Very confused. Cannot hold converstation  REVIEW OF SYSTEMS:   Review of Systems  Unable to perform ROS: Dementia   Tolerating Diet:very little Tolerating PT:   DRUG ALLERGIES:   Allergies  Allergen Reactions  . Ace Inhibitors Other (See Comments)    Tolerates lisinopril at home. Pt does not recall allergy.  . Atorvastatin     REACTION: myalgias  . Oxycodone-Acetaminophen     REACTION: confusion, fatigue  . Rosuvastatin     REACTION: leg weakness  . Simvastatin     REACTION: leg weakness  . Codeine Nausea And Vomiting and Rash  . Sulfonamide Derivatives Hives and Rash    VITALS:  Blood pressure 139/72, pulse 83, temperature 97.6 F (36.4 C), temperature source Oral, resp. rate 18, height 5\' 5"  (1.651 m), weight 98 kg, SpO2 98 %.  PHYSICAL EXAMINATION:   Physical Exam Limited exam GENERAL:  87 y.o.-year-old patient lying in the bed with no acute distress. Chronically ill LUNGS: Normal breath sounds bilaterally, no wheezing, rales, rhonchi. No use of accessory muscles of respiration.  CARDIOVASCULAR: S1, S2 normal. No murmurs, rubs, or gallops.  ABDOMEN: Soft, nontender, nondistended. Bowel sounds present. No organomegaly or mass.  EXTREMITIES:uler on the heel  NEUROLOGIC: non focal  PSYCHIATRIC:  patient is alert but dementia at baseline SKIN:  Pressure Injury 07/19/20 Sacrum Medial Unstageable - Full thickness tissue loss in which the base of the injury is covered by slough (yellow, tan, gray, green or brown) and/or eschar (tan, brown or black) in the wound bed. small amount of tunneling with  (Active)  07/19/20 0211  Location: Sacrum  Location Orientation: Medial  Staging: Unstageable - Full thickness tissue loss in which the base of the injury is covered by slough (yellow, tan, gray, green or brown) and/or eschar  (tan, brown or black) in the wound bed.  Wound Description (Comments): small amount of tunneling with eschar  Present on Admission: Yes     Pressure Injury 07/19/20 Heel Left Unstageable - Full thickness tissue loss in which the base of the injury is covered by slough (yellow, tan, gray, green or brown) and/or eschar (tan, brown or black) in the wound bed. eschar around edge of injury (Active)  07/19/20 0214  Location: Heel  Location Orientation: Left  Staging: Unstageable - Full thickness tissue loss in which the base of the injury is covered by slough (yellow, tan, gray, green or brown) and/or eschar (tan, brown or black) in the wound bed.  Wound Description (Comments): eschar around edge of injury  Present on Admission: Yes         LABORATORY PANEL:  CBC Recent Labs  Lab 08/03/20 0509  WBC 7.8  HGB 9.3*  HCT 29.1*  PLT 328    Chemistries  Recent Labs  Lab 08/05/20 0405  NA 137  K 3.1*  CL 109  CO2 19*  GLUCOSE 100*  BUN 14  CREATININE 0.98  CALCIUM 8.8*  MG 1.8   Cardiac Enzymes No results for input(s): TROPONINI in the last 168 hours. RADIOLOGY:  No results found. ASSESSMENT AND PLAN:   [name] is a 87 y.o. female with medical history significant of dementia, diastolic CHF, s/p Lumbar fusion due to L3 frx and repair of traumatic CSF leakOct 2021,anemia, atrial flutter, breast cancer status post radiation therapy, CKD stage  III, history of CVA, history of DVT left leg postoperatively, esophageal stricture,GERD, C. difficile infection,severe osteopenia,falls, stage IV decubitus ulcer, unstageable left heel decubitus ulcer. She was brought to the hospital because of hypotension, decreased urine output, confusion and somnolence.  She was admitted to the hospital for acute kidney injury, hypotension and acute metabolic encephalopathy.    Acute metabolic encephalopathy Suspected hospital-acquired delirium CT head on 07/20/2020 showed -Mildly motion  degraded examination. Trace hyperdensity within the lateral ventricle occipital horns bilaterally, likely reflecting acute intraventricular hemorrhage. Mild generalized atrophy of the brain with chronic small vessel ischemic disease. --No further inpatient intervention per neurosurgeon. --Antipsychotics as needed for agitation.  Stage IV sacral decubitus ulcer, unstageable left heel decubitus ulcer -Status post debridement on 07/22/2020 -Wound VAC was replaced by wound care nurse on 07/29/2020. -General surgeon has signed off. -Wound growing VRE and MRSA -Patient is not a candidate for diverting colostomy per general surgeon. -Completed IV unasyn and change to PO doxycycline for total 14 days -Urine culture showed Enterococcus faecalis but per ID this is likely a contaminant.  Severe protein-calorie malnutrition, poor oral intake -Patient has labile mental status with agitation and combativeness.   -given her dementia there is  concern that she will remove NG tube or PEG tube and so these are not feasible at the moment.  --She refuses to eat or drink.  She is on IV fluids for hydration. Sporadically drinks ensure.  --not enough po intake  Hypernatremia and hypokalemia Improved with IVF  Hypomagnesemia Replete magnesium with IV magnesium sulfate  Acute intraventricular hemorrhage-Trace No further work-up from neurosurgeon.  AKI(acute kidney injury) ([place]) -appears to be prerenal-dehydration Lisinopril and Lasix have been held.   Other comorbidities include chronic diastolic CHF, hypertension, hyperlipidemia, type II DM (hemoglobin A1c 5.6)  Case discussed with [name] (son) today.   He tells me to get in touch with patient's son [name] who is going to be the point of contact    Diet Order                  DIET - DYS 1 Room service appropriate? No; Fluid consistency: Thin  Diet effective now                     Procedures: Family communication :spoke  with [name]--asked me to call Roger--spoke with roger Consults : CODE STATUS: DNR DVT Prophylaxis :SCD Level of care: Med-Surg Status is: Inpatient  Remains inpatient appropriate because:Unsafe d/c plan   Dispo: The patient is from: [facility]              Anticipated d/c is to: TBD              Anticipated d/c date is: TBD              Patient currently optimized   Difficult to place patient no  TOTAL TIME TAKING CARE OF THIS PATIENT: *25* minutes.  >50% time spent on counselling and coordination of care  Note: This dictation was prepared with Dragon dictation along with smaller phrase technology. Any transcriptional errors that result from this process are unintentional.  Fritzi Mandes M.D    [facility]   CC: Primary care physician; Biagio Borg, MDPatient ID: Bonnetta Barry, female   DOB: [birth year], 87 y.o.   MRN: [id number]

## 2020-08-05 NOTE — Consult Note (Signed)
[facility] Nurse wound follow up Patient is premedicated prior to procedure.  Wound type:stage 4 sacral wound.  NPWT dressing change and bridged to left trochanter.  Measurement: unchanged.  New area at 9 clock, intact nonblanchable erythema 1.5 cm x 5 cm  Wound SUO:[id number] Drainage (amount, consistency, odor) minimal serosanguinous  No odor.  Periwound:Note erythema above  Protected with barrier ring to this area.   Dressing procedure/placement/frequency:2 pieces black foam plus foam for bridge.  Intact skin protected with drape. Covered and seal immediately achieved at 125 mmHg  Change Mon/Wed/Fri.    Will follow.   Domenic Moras MSN, RN, FNP-BC CWON Wound, Ostomy, Continence Nurse Pager [phone]

## 2020-08-05 NOTE — Progress Notes (Signed)
Patient lost PIV access.  IV team assessed patient but was unable to place PIV. MD aware and plan to contact family for consent to use right arm for access. Will continue to encourage fluids and PO intake

## 2020-08-06 DIAGNOSIS — N179 Acute kidney failure, unspecified: Secondary | ICD-10-CM | POA: Diagnosis not present

## 2020-08-06 DIAGNOSIS — I10 Essential (primary) hypertension: Secondary | ICD-10-CM | POA: Diagnosis not present

## 2020-08-06 DIAGNOSIS — N1831 Chronic kidney disease, stage 3a: Secondary | ICD-10-CM | POA: Diagnosis not present

## 2020-08-06 DIAGNOSIS — G9341 Metabolic encephalopathy: Secondary | ICD-10-CM | POA: Diagnosis not present

## 2020-08-06 LAB — GLUCOSE, CAPILLARY
Glucose-Capillary: 104 mg/dL — ABNORMAL HIGH (ref 70–99)
Glucose-Capillary: 121 mg/dL — ABNORMAL HIGH (ref 70–99)
Glucose-Capillary: 123 mg/dL — ABNORMAL HIGH (ref 70–99)
Glucose-Capillary: 163 mg/dL — ABNORMAL HIGH (ref 70–99)
Glucose-Capillary: 87 mg/dL (ref 70–99)
Glucose-Capillary: 90 mg/dL (ref 70–99)
Glucose-Capillary: 94 mg/dL (ref 70–99)
Glucose-Capillary: 94 mg/dL (ref 70–99)
Glucose-Capillary: 99 mg/dL (ref 70–99)

## 2020-08-06 MED ORDER — MORPHINE SULFATE (PF) 2 MG/ML IV SOLN
2.0000 mg | Freq: Every day | INTRAVENOUS | Status: DC | PRN
  Administered 2020-08-07: 2 mg via INTRAVENOUS
  Filled 2020-08-06: qty 1

## 2020-08-06 MED ORDER — SODIUM CHLORIDE 0.9 % IV SOLN
100.0000 mg | Freq: Two times a day (BID) | INTRAVENOUS | Status: DC
  Administered 2020-08-06: 100 mg via INTRAVENOUS
  Filled 2020-08-06 (×3): qty 100

## 2020-08-06 MED ORDER — ACETAMINOPHEN 325 MG PO TABS: 650.0000 mg | ORAL_TABLET | Freq: Four times a day (QID) | ORAL | Status: DC | PRN

## 2020-08-06 NOTE — Progress Notes (Signed)
[facility] [place] NAME: [name]    MR#:  [id number]  DATE OF BIRTH:  1930-04-01  SUBJECTIVE:  Very confused. Cannot hold converstation Sleepy this am. Not recieved any sedating meds last pm. Per RN she has not taken her PO meds today. Has not eaten anything since yesterday afternoon. Keeps refusing to eat. REVIEW OF SYSTEMS:   Review of Systems  Unable to perform ROS: Dementia   Tolerating Diet:no Tolerating PT:   DRUG ALLERGIES:   Allergies  Allergen Reactions  . Ace Inhibitors Other (See Comments)    Tolerates lisinopril at home. Pt does not recall allergy.  . Atorvastatin     REACTION: myalgias  . Oxycodone-Acetaminophen     REACTION: confusion, fatigue  . Rosuvastatin     REACTION: leg weakness  . Simvastatin     REACTION: leg weakness  . Codeine Nausea And Vomiting and Rash  . Sulfonamide Derivatives Hives and Rash    VITALS:  Blood pressure (!) 173/55, pulse 81, temperature 98.5 F (36.9 C), temperature source Oral, resp. rate 20, height 5\' 5"  (1.651 m), weight 98 kg, SpO2 95 %.  PHYSICAL EXAMINATION:   Physical Exam Limited exam GENERAL:  87 y.o.-year-old patient lying in the bed with no acute distress. Chronically ill, lethrgic LUNGS: Normal breath sounds bilaterally, no wheezing, rales, rhonchi. No use of accessory muscles of respiration.  CARDIOVASCULAR: S1, S2 normal. No murmurs, rubs, or gallops.  ABDOMEN: Soft, nontender, nondistended.EXTREMITIES:uler on the heel  NEUROLOGIC: non focal  PSYCHIATRIC:  patient is lethargic SKIN:  Pressure Injury 07/19/20 Sacrum Medial Unstageable - Full thickness tissue loss in which the base of the injury is covered by slough (yellow, tan, gray, green or brown) and/or eschar (tan, brown or black) in the wound bed. small amount of tunneling with  (Active)  07/19/20 0211  Location: Sacrum  Location Orientation: Medial  Staging: Unstageable - Full thickness tissue loss  in which the base of the injury is covered by slough (yellow, tan, gray, green or brown) and/or eschar (tan, brown or black) in the wound bed.  Wound Description (Comments): small amount of tunneling with eschar  Present on Admission: Yes     Pressure Injury 07/19/20 Heel Left Unstageable - Full thickness tissue loss in which the base of the injury is covered by slough (yellow, tan, gray, green or brown) and/or eschar (tan, brown or black) in the wound bed. eschar around edge of injury (Active)  07/19/20 0214  Location: Heel  Location Orientation: Left  Staging: Unstageable - Full thickness tissue loss in which the base of the injury is covered by slough (yellow, tan, gray, green or brown) and/or eschar (tan, brown or black) in the wound bed.  Wound Description (Comments): eschar around edge of injury  Present on Admission: Yes         LABORATORY PANEL:  CBC Recent Labs  Lab 08/03/20 0509  WBC 7.8  HGB 9.3*  HCT 29.1*  PLT 328    Chemistries  Recent Labs  Lab 08/05/20 0405  NA 137  K 3.1*  CL 109  CO2 19*  GLUCOSE 100*  BUN 14  CREATININE 0.98  CALCIUM 8.8*  MG 1.8   Cardiac Enzymes No results for input(s): TROPONINI in the last 168 hours. RADIOLOGY:  No results found. ASSESSMENT AND PLAN:   [name] is a 87 y.o. female with medical history significant of dementia, diastolic CHF, s/p Lumbar fusion due to L3  frx and repair of traumatic CSF leakOct 2021,anemia, atrial flutter, breast cancer status post radiation therapy, CKD stage III, history of CVA, history of DVT left leg postoperatively, esophageal stricture,GERD, C. difficile infection,severe osteopenia,falls, stage IV decubitus ulcer, unstageable left heel decubitus ulcer. She was brought to the hospital because of hypotension, decreased urine output, confusion and somnolence.  She was admitted to the hospital for acute kidney injury, hypotension and acute metabolic encephalopathy.    Acute  metabolic encephalopathy Suspected hospital-acquired delirium CT head on 07/20/2020 showed-Mildly motion degraded examination. Trace hyperdensity within the lateral ventricle occipital horns bilaterally, likely reflecting acute intraventricular hemorrhage. Mild generalized atrophy of the brain with chronic small vessel ischemic disease. --No further inpatient intervention per neurosurgeon.  Stage IV sacral decubitus ulcer, unstageable left heel decubitus ulcer -Status post debridement on 07/22/2020 -Wound VAC was replaced by wound care nurse on 07/29/2020. -General surgeon has signed off. -Wound growing VRE and MRSA -Patient is not a candidate for diverting colostomy per general surgeon. -Completed IV unasyn and change cont doxycycline till march 3rd -Urine culture showed Enterococcus faecalis but per ID this is likely a contaminant.  Severe protein-calorie malnutrition, poor oral intake -Patient has intermittent labile mental status with agitation   . -given her dementia there is  concern that she will remove NG tube or PEG tube and so these are not feasible at the moment.  --She refuses to eat or drink.   Sporadically drinks ensure.  --not enough po intake  Hypernatremia and hypokalemia Hypomagnesemia Improved   Acute intraventricular hemorrhage-Trace No further work-up from neurosurgeon.  AKI(acute kidney injury) ([place]) -appears to be prerenal-dehydration Lisinopril and Lasix have been held. Creat 0.98 Will worsen if she continues to not drink or eat   Other comorbidities include chronic diastolic CHF, hypertension, hyperlipidemia, type II DM (hemoglobin A1c 5.6)      Diet Order                  DIET - DYS 1 Room service appropriate? No; Fluid consistency: Thin  Diet effective now                     Procedures: Family communication : spoke with patient's son [name] at length 2/23. He understands patient has poor prognosis. He will be discussing  with rest of his family. I left a message on his voicemail today. CODE STATUS: DNR DVT Prophylaxis :SCD Level of care: Med-Surg Status is: Inpatient  Remains inpatient appropriate because:Unsafe d/c plan   Dispo: The [facility]: [facility]              Anticipated d/c is to: TBD              Anticipated d/c date is: TBD              Patient currently optimized   Difficult to place patient no  TOTAL TIME TAKING CARE OF THIS PATIENT: *25* minutes.  >50% time spent on counselling and coordination of care  Note: This dictation was prepared with Dragon dictation along with smaller phrase technology. Any transcriptional errors that result from this process are unintentional.  Fritzi Mandes M.D    [facility]   CC: Primary care physician; Biagio Borg, MDPatient ID: [name], female   DOB: 1929-08-14, 87 y.o.   MRN: [id number]

## 2020-08-06 NOTE — Progress Notes (Signed)
Pt refused all medications and meals during this past shift.

## 2020-08-06 NOTE — Care Management Important Message (Signed)
Important Message  Patient Details  Name: [name] MRN: [id number] Date of Birth: 24-Mar-1930   Medicare Important Message Given:  Yes  Reviewed Medicare IM with son, Cherica Heiden, at [phone].  Copy of Medicare IM sent securely to his email address provided: [email or web address].   Dannette Barbara 08/06/2020, 3:39 PM

## 2020-08-06 NOTE — TOC Progression Note (Signed)
Transition of Care [facility] - Progression Note    Patient Details  Name: [name] MRN: [id number] Date of Birth: 10/26/1929  [facility]) CM/SW [place], LCSW Phone Number: 08/06/2020, 3:19 PM  Clinical Narrative:  CSW spoke with son [name]. Plan to appeal discharge once orders are in. CMA will call him tomorrow to provide IM information. He asked that CSW contact [facility] regarding bed availability and ordering wound vac. Admissions coordinator has been notified of pending discharge and will order wound vac.   Expected Discharge Plan: Skilled Nursing Facility Barriers to Discharge: Continued Medical Work up  Expected Discharge Plan and Services Expected Discharge Plan: [place]                                               Social Determinants of Health (SDOH) Interventions    Readmission Risk Interventions Readmission Risk Prevention Plan 07/20/2020  Transportation Screening Complete  Social Work Consult for [place] Planning/Counseling Complete  Medication Review Press photographer) Complete  Some recent data might be hidden

## 2020-08-06 NOTE — Progress Notes (Signed)
Nutrition Follow-up  DOCUMENTATION CODES:   Obesity unspecified  INTERVENTION:   Ensure Enlive po BID, each supplement provides 350 kcal and 20 grams of protein  Magic cup TID with meals, each supplement provides 290 kcal and 9 grams of protein  MVI daily  Vitamin C 250m po BID   Pt at high refeed risk; recommend monitor potassium, magnesium and phosphorus labs daily until stable  NUTRITION DIAGNOSIS:   Increased nutrient needs related to wound healing as evidenced by estimated needs.  GOAL:   Patient will meet greater than or equal to 90% of their needs  -not met   MONITOR:   PO intake,Supplement acceptance,Diet advancement,Labs,Weight trends,Skin,I & O's  ASSESSMENT:   87y.o. female with medical history significant of dementia, diastolic CHF   s/p Lumbar fusion due to L3 frx and repair of traumatic CSF leak Oct 2021, anemia, atrial flutter, breast cancer status post radiation therapy, CKD stage III, history of CVA, history of DVT left leg postoperatively, is a   esophageal stricture, GERD,  C. difficile infection, left foot pressure ulcer, severe osteopenia, falls   Admitted for AKI and dehydration, DM2  Pt continues to have poor appetite and oral intake in hospital. Pt is refusing most meals and supplements but will occasionally eat a few bites of food and drink a few sips of Ensure. RD has spoken with MD regarding a short term trial with nasogastric tube placement and tube feeds. It is felt that with pt's AMS and agitation that she would just remove the tube. No improvement in oral intake after addition of remeron. Pt's wound appears to be getting larger. At this point, there is nothing more that can be offered from a nutritional standpoint. GOC are being discussed with family.   No new weight since admit.    Medications reviewed and include: allopurinol, ascorbic acid, doxycycline, insulin, remeron, MVI  Labs reviewed: K 3.1(L), P 2.9 wnl, Mg 1.8 wnl Hgb 9.3(L), Hct  29.1(L)  Diet Order:   Diet Order            DIET - DYS 1 Room service appropriate? No; Fluid consistency: Thin  Diet effective now                EDUCATION NEEDS:   No education needs have been identified at this time  Skin:  Skin Assessment: Reviewed RN Assessment (Stage IV sacrum 8 cm x 16 cm x 2.3 cm)  Last BM:  2/23- type 7  Height:   Ht Readings from Last 1 Encounters:  07/18/20 5' 5"  (1.651 m)    Weight:   Wt Readings from Last 1 Encounters:  07/18/20 98 kg    Ideal Body Weight:  56.8 kg  BMI:  Body mass index is 35.95 kg/m.  Estimated Nutritional Needs:   Kcal:  1800-2000  Protein:  100-115 grams  Fluid:  > 1.8 L  CKoleen DistanceMS, RD, LDN Please refer to [facility] LLCfor RD and/or RD on-call/weekend/after hours pager

## 2020-08-07 DIAGNOSIS — L89154 Pressure ulcer of sacral region, stage 4: Secondary | ICD-10-CM | POA: Diagnosis not present

## 2020-08-07 DIAGNOSIS — G9341 Metabolic encephalopathy: Secondary | ICD-10-CM | POA: Diagnosis not present

## 2020-08-07 DIAGNOSIS — N1831 Chronic kidney disease, stage 3a: Secondary | ICD-10-CM | POA: Diagnosis not present

## 2020-08-07 DIAGNOSIS — E43 Unspecified severe protein-calorie malnutrition: Secondary | ICD-10-CM | POA: Diagnosis not present

## 2020-08-07 LAB — GLUCOSE, CAPILLARY
Glucose-Capillary: 72 mg/dL (ref 70–99)
Glucose-Capillary: 78 mg/dL (ref 70–99)
Glucose-Capillary: 149 mg/dL — ABNORMAL HIGH (ref 70–99)
Glucose-Capillary: 81 mg/dL (ref 70–99)
Glucose-Capillary: 86 mg/dL (ref 70–99)
Glucose-Capillary: 85 mg/dL (ref 70–99)

## 2020-08-07 MED ORDER — DOXYCYCLINE HYCLATE 100 MG PO TABS
100.0000 mg | ORAL_TABLET | Freq: Two times a day (BID) | ORAL | Status: AC
  Administered 2020-08-07 – 2020-08-08 (×4): 100 mg via ORAL
  Filled 2020-08-07 (×4): qty 1

## 2020-08-07 NOTE — Plan of Care (Signed)
Patient has a very poor appetite but took all her night time meds crushed in ice cream. Patient slept most of the night.  Blood sugars are stable. Last one was 87. Will continue to monitor.  Christene Slates

## 2020-08-07 NOTE — Progress Notes (Addendum)
[facility] [place] NAME: [name]    MR#:  [id number]  DATE OF BIRTH:  1930-02-16  SUBJECTIVE:  Ate about 30% of her BF today. No new issues per RN. At times refuses po meds and food Confused at baseline REVIEW OF SYSTEMS:   Review of Systems  Unable to perform ROS: Dementia   Tolerating Diet:no Tolerating PT: bed bound, does not participate in PT actively. PT signed off  DRUG ALLERGIES:   Allergies  Allergen Reactions  . Ace Inhibitors Other (See Comments)    Tolerates lisinopril at home. Pt does not recall allergy.  . Atorvastatin     REACTION: myalgias  . Oxycodone-Acetaminophen     REACTION: confusion, fatigue  . Rosuvastatin     REACTION: leg weakness  . Simvastatin     REACTION: leg weakness  . Codeine Nausea And Vomiting and Rash  . Sulfonamide Derivatives Hives and Rash    VITALS:  Blood pressure (!) 139/46, pulse 62, temperature 97.8 F (36.6 C), temperature source Oral, resp. rate 20, height 5\' 5"  (1.651 m), weight 98 kg, SpO2 95 %.  PHYSICAL EXAMINATION:   Physical Exam Limited exam GENERAL:  87 y.o.-year-old patient lying in the bed with no acute distress. Chronically ill LUNGS: Normal breath sounds bilaterally, no wheezing, rales, rhonchi. No use of accessory muscles of respiration.  CARDIOVASCULAR: S1, S2 normal. No murmurs, rubs, or gallops.  ABDOMEN: Soft, nontender, nondistended.EXTREMITIES:ulcer on the heel as below  NEUROLOGIC: non focal  PSYCHIATRIC:  patient is sleepy--opens eys to VC SKIN:  Pressure Injury 07/19/20 Sacrum Medial Unstageable - Full thickness tissue loss in which the base of the injury is covered by slough (yellow, tan, gray, green or brown) and/or eschar (tan, brown or black) in the wound bed. small amount of tunneling with  (Active)  07/19/20 0211  Location: Sacrum  Location Orientation: Medial  Staging: Unstageable - Full thickness tissue loss in which the base of the injury  is covered by slough (yellow, tan, gray, green or brown) and/or eschar (tan, brown or black) in the wound bed.  Wound Description (Comments): small amount of tunneling with eschar  Present on Admission: Yes     Pressure Injury 07/19/20 Heel Left Unstageable - Full thickness tissue loss in which the base of the injury is covered by slough (yellow, tan, gray, green or brown) and/or eschar (tan, brown or black) in the wound bed. eschar around edge of injury (Active)  07/19/20 0214  Location: Heel  Location Orientation: Left  Staging: Unstageable - Full thickness tissue loss in which the base of the injury is covered by slough (yellow, tan, gray, green or brown) and/or eschar (tan, brown or black) in the wound bed.  Wound Description (Comments): eschar around edge of injury  Present on Admission: Yes    LABORATORY PANEL:  CBC Recent Labs  Lab 08/03/20 0509  WBC 7.8  HGB 9.3*  HCT 29.1*  PLT 328    Chemistries  Recent Labs  Lab 08/05/20 0405  NA 137  K 3.1*  CL 109  CO2 19*  GLUCOSE 100*  BUN 14  CREATININE 0.98  CALCIUM 8.8*  MG 1.8   Cardiac Enzymes No results for input(s): TROPONINI in the last 168 hours. RADIOLOGY:  No results found. ASSESSMENT AND PLAN:   [name] is a 87 y.o. female with medical history significant of dementia, diastolic CHF, s/p Lumbar fusion due to L3 frx and repair of traumatic  CSF leakOct 2021,anemia, atrial flutter, breast cancer status post radiation therapy, CKD stage III, history of CVA, history of DVT left leg postoperatively, esophageal stricture,GERD, C. difficile infection,severe osteopenia,falls, stage IV decubitus ulcer, unstageable left heel decubitus ulcer. She was brought to the hospital because of hypotension, decreased urine output, confusion and somnolence.  She was admitted to the hospital for acute kidney injury, hypotension and acute metabolic encephalopathy.    Acute metabolic encephalopathy Suspected  hospital-acquired delirium CT head on 07/20/2020 showed-Mildly motion degraded examination. Trace hyperdensity within the lateral ventricle occipital horns bilaterally, likely reflecting acute intraventricular hemorrhage. Mild generalized atrophy of the brain with chronic small vessel ischemic disease. --No further inpatient intervention per neurosurgeon.  Stage IV sacral decubitus ulcer, unstageable left heel decubitus ulcer -Status post debridement on 07/22/2020 -Wound VAC was replaced by wound care nurse on 07/29/2020. -General surgeon has signed off. -Wound growing VRE and MRSA -Patient is not a candidate for diverting colostomy per general surgeon. -Completed IV unasyn and change cont doxycycline till march 3rd -Urine culture showed Enterococcus faecalis but per ID this is likely a contaminant.  Severe protein-calorie malnutrition, poor oral intake -Patient has intermittent labile mental status with agitation   . -given her dementia there is  concern that she will remove NG tube or PEG tube and so these are not feasible at the moment.  --She refuses to eat or drink.   Sporadically drinks ensure.  --2/15--ate about 30% BF. stff requested to document intake  Hypernatremia and hypokalemia Hypomagnesemia Improved   Acute intraventricular hemorrhage-Trace No further work-up from neurosurgeon.  AKI(acute kidney injury) ([place]) -appears to be prerenal-dehydration Lisinopril and Lasix have been held. Creat 0.98 Will worsen if she continues to not drink or eat   Other comorbidities include chronic diastolic CHF, hypertension, hyperlipidemia, type II DM (hemoglobin A1c 5.6)      Diet Order                  DIET - DYS 1 Room service appropriate? No; Fluid consistency: Thin  Diet effective now                     Procedures: Family communication : spoke with patient's son [name] at length 2/23. He understands patient has poor prognosis. CODE STATUS:  DNR DVT Prophylaxis :SCD Level of care: Med-Surg Status is: Inpatient  Discussed with patient's son Acquanetta Cabanilla. Plan is to work on discharge her to rehab. Family wants to continue current management. Patient PO intake is variable. Son understands long term poor prognosis given multiple comorbidities. TOC aware and working on discharge planning.   Dispo: The [facility]: [facility]              Anticipated d/c is to:SNF              Anticipated d/c date is: TBD              Patient currently best optimized for discharge    TOTAL TIME TAKING CARE OF THIS PATIENT: *25* minutes.  >50% time spent on counselling and coordination of care  Note: This dictation was prepared with Dragon dictation along with smaller phrase technology. Any transcriptional errors that result from this process are unintentional.  Fritzi Mandes M.D    [facility]   CC: Primary care physician; Biagio Borg, MDPatient ID: Bonnetta Barry, female   DOB: 04-23-1930, 87 y.o.   MRN: [id number]

## 2020-08-07 NOTE — Plan of Care (Signed)
  Problem: Elimination: Goal: Will not experience complications related to bowel motility Outcome: Progressing Goal: Will not experience complications related to urinary retention Outcome: Progressing   

## 2020-08-07 NOTE — Consult Note (Signed)
[place] Nurse wound follow up Patient is premedicated prior to procedure Wound type:stage 4 sacral wound Measurement:9 cm x 16 cm x 3.2 cm Wound bed: 25% bone 60% pale pink, yellow nongranulating 15% red  Wound bed has declined today.  Drainage (amount, consistency, odor) moderate serosanguinous  No odor.  Periwound:nonblancahble erythema has improved Dressing procedure/placement/frequency: COntinue VAc therapy.  Bridging to trochanter to offload pressure  CHange Mon/Wed/Fri.   Will follow.  Domenic Moras MSN, RN, FNP-BC CWON Wound, Ostomy, Continence Nurse Pager [phone]

## 2020-08-07 NOTE — TOC Progression Note (Addendum)
Transition of Care [facility]) - Progression Note    Patient Details  Name: TAKYIA SINDT MRN: [id number] Date of Birth: [birth year]  [facility] CM/SW Contact  Beverly Sessions, RN Phone Number: 08/07/2020, 9:56 AM  Clinical Narrative:    Notifed by MD that son [name] is in agreement for discharge to SNF Family had accepted bed at [facility].   I have reached out to [place] at [facility] to determine if they have the wound vac, and can accept the patient today.  She is to call me back  Insurance auth submitted through [place] portal    Update:  Insurance has denied. Son [name] notified.  He is aware that they will have to private pay like they were previously at [facility].  Provided [name] the business Glass blower/designer at [employer] [phone] in order to arrange private pay   [place] [facility] confirms they have the wound vac    Expected Discharge Plan: [place] Barriers to Discharge: Continued Medical Work up  Expected Discharge Plan and Services Expected Discharge Plan: [place]                                               Social Determinants of Health (SDOH) Interventions    Readmission Risk Interventions Readmission Risk Prevention Plan 07/20/2020  Transportation Screening Complete  Social Work Consult for [place] Planning/Counseling Complete  Medication Review Press photographer) Complete  Some recent data might be hidden

## 2020-08-08 DIAGNOSIS — G9341 Metabolic encephalopathy: Secondary | ICD-10-CM | POA: Diagnosis not present

## 2020-08-08 DIAGNOSIS — E43 Unspecified severe protein-calorie malnutrition: Secondary | ICD-10-CM | POA: Diagnosis not present

## 2020-08-08 DIAGNOSIS — L89154 Pressure ulcer of sacral region, stage 4: Secondary | ICD-10-CM | POA: Diagnosis not present

## 2020-08-08 DIAGNOSIS — N1831 Chronic kidney disease, stage 3a: Secondary | ICD-10-CM | POA: Diagnosis not present

## 2020-08-08 LAB — GLUCOSE, CAPILLARY
Glucose-Capillary: 71 mg/dL (ref 70–99)
Glucose-Capillary: 73 mg/dL (ref 70–99)
Glucose-Capillary: 82 mg/dL (ref 70–99)
Glucose-Capillary: 83 mg/dL (ref 70–99)
Glucose-Capillary: 86 mg/dL (ref 70–99)
Glucose-Capillary: 89 mg/dL (ref 70–99)
Glucose-Capillary: 91 mg/dL (ref 70–99)

## 2020-08-08 MED ORDER — MORPHINE SULFATE (PF) 2 MG/ML IV SOLN
1.0000 mg | Freq: Four times a day (QID) | INTRAVENOUS | Status: DC | PRN
  Administered 2020-08-08 (×2): 1 mg via INTRAVENOUS
  Filled 2020-08-08 (×3): qty 1

## 2020-08-08 NOTE — Progress Notes (Signed)
[place] [facility] [place] NAME: [name]    MR#:  [id number]  DATE OF BIRTH:  [birth year]  SUBJECTIVE:  moaning in pain on the hip Eats intermittently when fed At times refuses po meds and food Confused at baseline REVIEW OF SYSTEMS:   Review of Systems  Unable to perform ROS: Dementia   Tolerating Diet:no Tolerating PT: bed bound, does not participate in PT actively. PT signed off  DRUG ALLERGIES:   Allergies  Allergen Reactions  . Ace Inhibitors Other (See Comments)    Tolerates lisinopril at home. Pt does not recall allergy.  . Atorvastatin     REACTION: myalgias  . Oxycodone-Acetaminophen     REACTION: confusion, fatigue  . Rosuvastatin     REACTION: leg weakness  . Simvastatin     REACTION: leg weakness  . Codeine Nausea And Vomiting and Rash  . Sulfonamide Derivatives Hives and Rash    VITALS:  Blood pressure (!) 157/45, pulse 70, temperature 97.6 F (36.4 C), temperature source Axillary, resp. rate 19, height 5\' 5"  (1.651 m), weight 98 kg, SpO2 97 %.  PHYSICAL EXAMINATION:   Physical Exam Limited exam GENERAL:  87 y.o.-year-old patient lying in the bed with no acute distress. Chronically ill LUNGS: Normal breath sounds bilaterally, no wheezing, rales, rhonchi. No use of accessory muscles of respiration.  CARDIOVASCULAR: S1, S2 normal. No murmurs, rubs, or gallops.  ABDOMEN: Soft, nontender, nondistended.EXTREMITIES:ulcer on the heel as below  NEUROLOGIC: non focal  PSYCHIATRIC:  patient is sleepy--opens eys to VC SKIN:  Pressure Injury 07/19/20 Sacrum Medial Unstageable - Full thickness tissue loss in which the base of the injury is covered by slough (yellow, tan, gray, green or brown) and/or eschar (tan, brown or black) in the wound bed. small amount of tunneling with  (Active)  07/19/20 0211  Location: Sacrum  Location Orientation: Medial  Staging: Unstageable - Full thickness tissue loss in which the base of  the injury is covered by slough (yellow, tan, gray, green or brown) and/or eschar (tan, brown or black) in the wound bed.  Wound Description (Comments): small amount of tunneling with eschar  Present on Admission: Yes     Pressure Injury 07/19/20 Heel Left Unstageable - Full thickness tissue loss in which the base of the injury is covered by slough (yellow, tan, gray, green or brown) and/or eschar (tan, brown or black) in the wound bed. eschar around edge of injury (Active)  07/19/20 0214  Location: Heel  Location Orientation: Left  Staging: Unstageable - Full thickness tissue loss in which the base of the injury is covered by slough (yellow, tan, gray, green or brown) and/or eschar (tan, brown or black) in the wound bed.  Wound Description (Comments): eschar around edge of injury  Present on Admission: Yes    LABORATORY PANEL:  CBC Recent Labs  Lab 08/03/20 0509  WBC 7.8  HGB 9.3*  HCT 29.1*  PLT 328    Chemistries  Recent Labs  Lab 08/05/20 0405  NA 137  K 3.1*  CL 109  CO2 19*  GLUCOSE 100*  BUN 14  CREATININE 0.98  CALCIUM 8.8*  MG 1.8   Cardiac Enzymes No results for input(s): TROPONINI in the last 168 hours. RADIOLOGY:  No results found. ASSESSMENT AND PLAN:   [name] is a 87 y.o. female with medical history significant of dementia, diastolic CHF, s/p Lumbar fusion due to L3 frx and repair of traumatic CSF leakOct  2021,anemia, atrial flutter, breast cancer status post radiation therapy, CKD stage III, history of CVA, history of DVT left leg postoperatively, esophageal stricture,GERD, C. difficile infection,severe osteopenia,falls, stage IV decubitus ulcer, unstageable left heel decubitus ulcer. She was brought to the hospital because of hypotension, decreased urine output, confusion and somnolence.  She was admitted to the hospital for acute kidney injury, hypotension and acute metabolic encephalopathy.    Acute metabolic encephalopathy Suspected  hospital-acquired delirium CT head on 07/20/2020 showed-Mildly motion degraded examination. Trace hyperdensity within the lateral ventricle occipital horns bilaterally, likely reflecting acute intraventricular hemorrhage. Mild generalized atrophy of the brain with chronic small vessel ischemic disease. --No further inpatient intervention per neurosurgeon.  Stage IV sacral decubitus ulcer, unstageable left heel decubitus ulcer -Status post debridement on 07/22/2020 -Wound VAC was replaced by wound care nurse on 08/07/2020. -General surgeon has signed off. -Wound growing VRE and MRSA -Patient is not a candidate for diverting colostomy per general surgeon. -Completed unasyn and doxycycline.  Severe protein-calorie malnutrition, poor oral intake -Patient has intermittent labile mental status   . -given her dementia there is  concern that she will remove NG tube or PEG tube and so these are not feasible at the moment.  --She intermittently eats, Sporadically drinks ensure.   Hypernatremia,hypokalemia Hypomagnesemia Improved   Acute intraventricular hemorrhage-Trace No further work-up from neurosurgeon.  AKI(acute kidney injury) ([place]) -appears to be prerenal-dehydration Lisinopril and Lasix have been held. Creat 0.98 Will worsen if she continues to not drink or eat   Other comorbidities include chronic diastolic CHF, hypertension, hyperlipidemia, type II DM (hemoglobin A1c 5.6)      Diet Order                  DIET - DYS 1 Room service appropriate? No; Fluid consistency: Thin  Diet effective now                     Procedures: Family communication : spoke with patient's son [name] at length 2/23. He understands patient has poor prognosis. CODE STATUS: DNR DVT Prophylaxis :SCD Level of care: Med-Surg Status is: Inpatient  Discussed with patient's son [name]. Plan is to work on discharge her to rehab. Family wants to continue current management.  Patient PO intake is variable. Son understands long term poor prognosis given multiple comorbidities. Per TOC-- patient's insurance has denied payment. TOC has discussed with son for private pay. Patient does have a bed at [facility]   Dispo: The patient is from: [facility]              Anticipated d/c is [place] [place]              Anticipated d/c date is: TBD              Patient currently best optimized for discharge    TOTAL TIME TAKING CARE OF THIS PATIENT: *20* minutes.  >50% time spent on counselling and coordination of care  Note: This dictation was prepared with Dragon dictation along with smaller phrase technology. Any transcriptional errors that result from this process are unintentional.  Fritzi Mandes M.D    [facility]   CC: Primary care physician; Biagio Borg, MDPatient ID: [name], female   DOB: 1929-09-13, 87 y.o.   MRN: [id number]

## 2020-08-08 NOTE — TOC Progression Note (Signed)
Transition of Care [facility]) - Progression Note    Patient Details  Name: MAGDA MUISE MRN: [id number] Date of Birth: [birth year]  [facility] CM/SW Contact  Truitt Merle, LCSW ( Phone Number: 08/08/2020, 5:22 PM  Clinical Narrative:    Staffed patient with Dr. Posey Pronto. Spoke with son [name], who directed LCSW to speak with patient's other son Olar Santini [phone]). Spoke with Mr. Inocencio Homes who confirmed he and patient's spouse made private payment arrangements at [facility]. Ms. Maxi Carreras asked if patient will receive a special bed for her wounds and was very concerned about appropriate bed for patient. Asked Dr. Posey Pronto via secure chat if special bed is an option. Received a return voicemail from [place] in admissions at [facility] who confirmed payment made for patient and wound vac is available. Left another voicemail for [facility] asking about the special bed. Awaiting call back. TOC continuing to follow for discharge needs.  Loma [place] at [facility] confirms they have the wound vac    Expected Discharge Plan: [place] Barriers to Discharge: Continued Medical Work up  Expected Discharge Plan and Services Expected Discharge Plan: [place]                                               Social Determinants of Health (SDOH) Interventions    Readmission Risk Interventions Readmission Risk Prevention Plan 07/20/2020  Transportation Screening Complete  Social Work Consult for [place] Planning/Counseling Complete  Medication Review Press photographer) Complete  Some recent data might be hidden

## 2020-08-08 NOTE — Progress Notes (Signed)
I was able to crush meds in ensure and spoon feed her. It was about 38mL of ensure.  Patient was very lethargic and slept most of the night.  Will continue to monitor.  Christene Slates

## 2020-08-09 DIAGNOSIS — L89154 Pressure ulcer of sacral region, stage 4: Secondary | ICD-10-CM | POA: Diagnosis not present

## 2020-08-09 DIAGNOSIS — N1831 Chronic kidney disease, stage 3a: Secondary | ICD-10-CM | POA: Diagnosis not present

## 2020-08-09 DIAGNOSIS — E43 Unspecified severe protein-calorie malnutrition: Secondary | ICD-10-CM | POA: Diagnosis not present

## 2020-08-09 DIAGNOSIS — G9341 Metabolic encephalopathy: Secondary | ICD-10-CM | POA: Diagnosis not present

## 2020-08-09 LAB — GLUCOSE, CAPILLARY
Glucose-Capillary: 83 mg/dL (ref 70–99)
Glucose-Capillary: 79 mg/dL (ref 70–99)
Glucose-Capillary: 85 mg/dL (ref 70–99)
Glucose-Capillary: 87 mg/dL (ref 70–99)

## 2020-08-09 MED ORDER — MIRTAZAPINE 15 MG PO TABS
15.0000 mg | ORAL_TABLET | Freq: Every day | ORAL | 0 refills | Status: AC
Start: 2020-08-09 — End: ?

## 2020-08-09 MED ORDER — LISINOPRIL 10 MG PO TABS: 10.0000 mg | ORAL_TABLET | Freq: Every day | ORAL | 0 refills | Status: AC

## 2020-08-09 MED ORDER — ENSURE ENLIVE PO LIQD: 237.0000 mL | Freq: Three times a day (TID) | ORAL | 12 refills | Status: AC

## 2020-08-09 MED ORDER — MORPHINE SULFATE (CONCENTRATE) 10 MG /0.5 ML PO SOLN: 5.0000 mg | ORAL | 0 refills | Status: AC | PRN

## 2020-08-09 NOTE — Progress Notes (Signed)
Multiple attempts made to obtain vital signs, but patient was combative and refused.

## 2020-08-09 NOTE — NC FL2 (Signed)
[place] MEDICAID FL2 LEVEL OF CARE SCREENING TOOL     IDENTIFICATION  Patient Name: [name] Birthdate: 1929-09-29 Sex: female Admission Date (Current Location): 07/18/2020  [name] and [place] Number:  [name] (Was staying at [facility]. Husband lives in [place] and Address:  [facility], [address], [place], [place] [address]      Provider Number: [id number]  Attending Physician Name and Address:  Fritzi Mandes, MD  Relative Name and Phone Number:  Reneisha Stilley [phone]) [name] [phone])    Current Level of Care: Hospital Recommended Level of Care: [place] Prior Approval Number:    Date Approved/Denied:   PASRR Number: [id number]  Discharge Plan: SNF    Current Diagnoses: Patient Active Problem List   Diagnosis Date Noted  . Sacral decubitus ulcer, stage IV ([place]) 07/22/2020  . Dehydration 07/18/2020  . Hyperkalemia 07/18/2020  . Acute metabolic encephalopathy 31/54/0086  . Closed burst fracture of lumbar vertebra ([place]) 04/03/2020  . Acute respiratory failure with hypoxia and hypercapnia ([place]) 04/03/2020  . ATN (acute tubular necrosis) ([place]) 04/03/2020  . H/O bad fall 04/03/2020  . HCAP (healthcare-associated pneumonia) 04/03/2020  . Hematochezia 05/29/2019  . Diverticulosis   . Swelling of vagina 04/16/2019  . Skin lesion 03/26/2019  . Left leg cellulitis 01/15/2019  . Healed diabetic foot ulcer 12/01/2017  . Abnormal TSH 11/15/2016  . Preventative health care 04/20/2016  . Allergic rhinitis 04/20/2016  . Dysuria 12/09/2014  . External hemorrhoids without complication 76/19/5093  . Left ear pain 07/08/2014  . Hip pain   . GIB (gastrointestinal bleeding) 06/18/2014  . UTI (lower urinary tract infection) 06/18/2014  . Abdominal pain   . Lower GI bleed   . Arthritis 02/18/2014  . Enteritis due to Clostridium difficile 10/12/2013  . CKD (chronic kidney  disease) stage 3, GFR 30-59 ml/min ([name]) 10/11/2013  . Chronic diastolic CHF (congestive heart failure) ([place]) 10/11/2013  . Protein-calorie malnutrition, severe ([place]) 10/11/2013  . Hx of necrotizing fasciitis 10/10/2013  . GI bleed 10/10/2013  . AKI (acute kidney injury) ([place]) 10/10/2013  . Cough 09/10/2013  . Rectal bleeding 09/10/2013  . Acute on chronic diastolic CHF (congestive heart failure) ([place]) 08/31/2013  . Rash and nonspecific skin eruption 08/27/2013  . Pulmonary edema 08/25/2013  . Severe sepsis with septic shock ([place]) 08/13/2013  . Necrotizing fasciitis of perineum 08/12/2013  . Acute renal failure (ARF) ([place]) 08/12/2013  . Wheezing 06/07/2013  . Wound healing, delayed 01/26/2012  . Diverticulosis of colon with hemorrhage 12/19/2011  . Acute blood loss anemia 12/19/2011  . Malignant neoplasm of upper-outer quadrant of right breast in female, estrogen receptor positive ([place]) 08/01/2011  . Breast cancer, right breast-lobular s/p lumpectomy and sentinel node biopsy 06/30/2011  . Shingles 02/21/2011  . Bile duct calculus with nonacute cholecystitis 01/21/2011  . Morbid obesity ([place]) 01/21/2011  . TREMOR 08/17/2010  . Diabetes ([place]) 01/09/2008  . PERIPHERAL NEUROPATHY 10/17/2007  . FATIGUE 10/17/2007  . COLONIC POLYPS, HX OF 02/12/2007  . Hyperlipidemia 02/10/2007  . Gout 02/10/2007  . ANXIETY 02/10/2007  . Essential hypertension 02/10/2007  . CVA 02/10/2007  . DIVERTICULOSIS, COLON 02/10/2007  . IRRITABLE BOWEL, PREDOMINANTLY CONSTIPATION 02/10/2007    Orientation RESPIRATION BLADDER Height & Weight      (Disoriented x4)  Normal Indwelling catheter,Incontinent Weight: 216 lb 0.8 oz (98 kg) Height:  5\' 5"  (165.1 cm)  BEHAVIORAL SYMPTOMS/MOOD NEUROLOGICAL BOWEL NUTRITION STATUS  Other (Comment) (Anxious, restless)  (None) Continent Diet ((  Full liquid. Full supervision/assist. Meds crushed in puree))  AMBULATORY STATUS COMMUNICATION OF NEEDS Skin   Total Care  Verbally Wound Vac,Skin abrasions,Bruising,Other (Comment),Surgical wounds,PU Stage and Appropriate Care   PU Stage 2 Dressing:  (Mid lumbar: Foam)                   Personal Care Assistance Level of Assistance  Bathing,Feeding,Dressing Bathing Assistance: Maximum assistance Feeding assistance: Maximum assistance Dressing Assistance: Maximum assistance Total Care Assistance: Maximum assistance   Functional Limitations Info  Sight,Hearing,Speech Sight Info: Adequate Hearing Info: Adequate Speech Info: Adequate    SPECIAL CARE FACTORS FREQUENCY  PT (By licensed PT),OT (By licensed OT),Speech therapy     PT Frequency: 5x week OT Frequency: 5x week     Speech Therapy Frequency: 5x week      Contractures Contractures Info: Not present    Additional Factors Info  Code Status,Allergies,Isolation Precautions Code Status Info: DNR Allergies Info: Ace Inhibitors, Atorvastatin, Oxycodone-acetaminophen, Rosuvastatin, Simvastatin, Codeine, Sulfonamide Derivatives     Isolation Precautions Info: Contact precautions     Current Medications (08/09/2020):  This is the current hospital active medication list Current Facility-Administered Medications  Medication Dose Route Frequency Provider Last Rate Last Admin  . acetaminophen (TYLENOL) tablet 650 mg  650 mg Oral Q6H PRN Fritzi Mandes, MD      . albuterol (PROVENTIL) (2.5 MG/3ML) 0.083% nebulizer solution 2.5 mg  2.5 mg Nebulization Q2H PRN Piscoya, Jose, MD      . allopurinol (ZYLOPRIM) tablet 100 mg  100 mg Oral Daily Ralene Muskrat B, MD   100 mg at 08/09/20 0913  . amLODipine (NORVASC) tablet 5 mg  5 mg Oral Daily Ralene Muskrat B, MD   5 mg at 08/09/20 0913  . ascorbic acid (VITAMIN C) tablet 250 mg  250 mg Oral BID Ralene Muskrat B, MD   250 mg at 08/09/20 0913  . Chlorhexidine Gluconate Cloth 2 % PADS 6 each  6 each Topical Daily Sidney Ace, MD   6 each at 08/09/20 [phone]  . feeding supplement (ENSURE ENLIVE /  ENSURE PLUS) liquid 237 mL  237 mL Oral TID BM Piscoya, Jose, MD   237 mL at 08/09/20 0914  . hydrALAZINE (APRESOLINE) injection 10 mg  10 mg Intravenous Q4H PRN Ralene Muskrat B, MD   10 mg at 08/06/20 1540  . HYDROcodone-acetaminophen (NORCO/VICODIN) 5-325 MG per tablet 1 tablet  1 tablet Oral Q6H PRN Ralene Muskrat B, MD   1 tablet at 08/08/20 1325  . ipratropium-albuterol (DUONEB) 0.5-2.5 (3) MG/3ML nebulizer solution 3 mL  3 mL Nebulization Q6H PRN Piscoya, Jose, MD      . mirtazapine (REMERON) tablet 15 mg  15 mg Oral QHS Ralene Muskrat B, MD   15 mg at 08/08/20 2010  . morphine 2 MG/ML injection 1 mg  1 mg Intravenous Q6H PRN Fritzi Mandes, MD   1 mg at 08/08/20 2011  . multivitamin with minerals tablet 1 tablet  1 tablet Oral Daily Piscoya, Jose, MD   1 tablet at 08/08/20 1325     Discharge Medications: Please see discharge summary for a list of discharge medications.  Relevant Imaging Results:  Relevant Lab Results:   Additional Information SS#: [id number]. Was paying privately for LTC at [facility]. Family wants her closer to home in the [place] area.  Truitt Merle, LCSW

## 2020-08-09 NOTE — TOC Progression Note (Signed)
Transition of Care [facility]) - Progression Note    Patient Details  Name: [name] MRN: [id number] Date of Birth: May 04, 1930  Transition of Care [facility]) CM/SW Contact  Truitt Merle, LCSW Phone Number: 08/09/2020, 11:41 AM  Clinical Narrative:    Patient medically ready for discharge. LCSW confirmed with Teena in admissions at [facility] (SNF) that bed is an adjustable air mattress and SNF able to receive patient today. LCSW updated Dr. Posey Pronto and RN. Advised that SNF needs updated FL-2 and discharge summary. Patient going to room 124-B. After discharge summary is completed, RN to call report to [phone] and ask for 1st floor nurse desk.   UPDATE: 11:03AM- Received a call from [place] at [facility] stating the air mattress is no longer available and won't be available until Monday. Updated Dr. Posey Pronto and RN. Updated FL-2. TOC continuing to follow patient for discharge needs.   Expected Discharge Plan: Skilled Nursing Facility Barriers to Discharge: Continued Medical Work up  Expected Discharge Plan and Services Expected Discharge Plan: [place]         Expected Discharge Date: 08/09/20                                     Social Determinants of Health (SDOH) Interventions    Readmission Risk Interventions Readmission Risk Prevention Plan 07/20/2020  Transportation Screening Complete  Social Work Consult for [place] Planning/Counseling Complete  Medication Review Press photographer) Complete  Some recent data might be hidden

## 2020-08-09 NOTE — Progress Notes (Signed)
[place] [facility] [place] NAME: [name]    MR#:  [id number]  DATE OF BIRTH:  1930/04/11  SUBJECTIVE:  More awake today Eats intermittently when fed At times refuses po meds and food Confused at baseline REVIEW OF SYSTEMS:   Review of Systems  Unable to perform ROS: Dementia   Tolerating Diet:no Tolerating PT: bed bound, does not participate in PT actively. PT signed off  DRUG ALLERGIES:   Allergies  Allergen Reactions  . Ace Inhibitors Other (See Comments)    Tolerates lisinopril at home. Pt does not recall allergy.  . Atorvastatin     REACTION: myalgias  . Oxycodone-Acetaminophen     REACTION: confusion, fatigue  . Rosuvastatin     REACTION: leg weakness  . Simvastatin     REACTION: leg weakness  . Codeine Nausea And Vomiting and Rash  . Sulfonamide Derivatives Hives and Rash    VITALS:  Blood pressure (!) 148/62, pulse 94, temperature (!) 97.5 F (36.4 C), resp. rate 20, height 5\' 5"  (1.651 m), weight 98 kg, SpO2 95 %.  PHYSICAL EXAMINATION:   Physical Exam Limited exam GENERAL:  87 y.o.-year-old patient lying in the bed with no acute distress. Chronically ill, malnourished LUNGS: Normal breath sounds bilaterally, no wheezing, rales, rhonchi. No use of accessory muscles of respiration.  CARDIOVASCULAR: S1, S2 normal. No murmurs, rubs, or gallops.  ABDOMEN: Soft, nontender, nondistended.EXTREMITIES:ulcer on the heel as below  NEUROLOGIC: non focal  PSYCHIATRIC:  patient is sleepy--opens eys to VC SKIN:  Pressure Injury 07/19/20 Sacrum Medial Unstageable - Full thickness tissue loss in which the base of the injury is covered by slough (yellow, tan, gray, green or brown) and/or eschar (tan, brown or black) in the wound bed. small amount of tunneling with  (Active)  07/19/20 0211  Location: Sacrum  Location Orientation: Medial  Staging: Unstageable - Full thickness tissue loss in which the base of the injury is covered  by slough (yellow, tan, gray, green or brown) and/or eschar (tan, brown or black) in the wound bed.  Wound Description (Comments): small amount of tunneling with eschar  Present on Admission: Yes     Pressure Injury 07/19/20 Heel Left Unstageable - Full thickness tissue loss in which the base of the injury is covered by slough (yellow, tan, gray, green or brown) and/or eschar (tan, brown or black) in the wound bed. eschar around edge of injury (Active)  07/19/20 0214  Location: Heel  Location Orientation: Left  Staging: Unstageable - Full thickness tissue loss in which the base of the injury is covered by slough (yellow, tan, gray, green or brown) and/or eschar (tan, brown or black) in the wound bed.  Wound Description (Comments): eschar around edge of injury  Present on Admission: Yes    LABORATORY PANEL:  CBC Recent Labs  Lab 08/03/20 0509  WBC 7.8  HGB 9.3*  HCT 29.1*  PLT 328    Chemistries  Recent Labs  Lab 08/05/20 0405  NA 137  K 3.1*  CL 109  CO2 19*  GLUCOSE 100*  BUN 14  CREATININE 0.98  CALCIUM 8.8*  MG 1.8   Cardiac Enzymes No results for input(s): TROPONINI in the last 168 hours. RADIOLOGY:  No results found. ASSESSMENT AND PLAN:   [name] is a 87 y.o. female with medical history significant of dementia, diastolic CHF, s/p Lumbar fusion due to L3 frx and repair of traumatic CSF leakOct 2021,anemia, atrial flutter, breast  cancer status post radiation therapy, CKD stage III, history of CVA, history of DVT left leg postoperatively, esophageal stricture,GERD, C. difficile infection,severe osteopenia,falls, stage IV decubitus ulcer, unstageable left heel decubitus ulcer. She was brought to the hospital because of hypotension, decreased urine output, confusion and somnolence.  She was admitted to the hospital for acute kidney injury, hypotension and acute metabolic encephalopathy.    Acute metabolic encephalopathy Suspected hospital-acquired  delirium CT head on 07/20/2020 showed-Mildly motion degraded examination. Trace hyperdensity within the lateral ventricle occipital horns bilaterally, likely reflecting acute intraventricular hemorrhage. Mild generalized atrophy of the brain with chronic small vessel ischemic disease. --No further inpatient intervention per neurosurgeon.  Stage IV sacral decubitus ulcer, unstageable left heel decubitus ulcer -Status post debridement on 07/22/2020 -Wound VAC was replaced by wound care nurse on 08/07/2020. -General surgeon has signed off. -Wound growing VRE and MRSA -Patient is not a candidate for diverting colostomy per general surgeon. -Completed unasyn and doxycycline. --foley to help with wound healing  Severe protein-calorie malnutrition, poor oral intake Adult Failure to thrive -Patient has intermittent labile mental status   . -given her dementia there is  concern that she will remove NG tube or PEG tube and so these are not feasible at the moment.  --She intermittently eats, Sporadically drinks ensure.   Hypernatremia,hypokalemia Hypomagnesemia Improved   Acute intraventricular hemorrhage-Trace No further work-up from neurosurgeon.  AKI(acute kidney injury) ([place]) -appears to be prerenal-dehydration Lisinopril and Lasix have been held. Creat 0.98 Will worsen if she continues to not drink or eat   Other comorbidities include chronic diastolic CHF, hypertension, hyperlipidemia, type II DM (hemoglobin A1c 5.6) Overall poor prognosis. Palliative care consultation appreciated     Diet Order                  DIET - DYS 1 Room service appropriate? No; Fluid consistency: Thin  Diet effective now                   Family communication : spoke with patient's son [name] at length 2/23. He understands patient has poor prognosis. CODE STATUS: DNR DVT Prophylaxis :SCD Level of care: Med-Surg Status is: Inpatient  Discussed with patient's son Lahoma Constantin.  Plan is to work on discharge her to rehab. Family wants to continue current management. Patient PO intake is variable. Son understands long term poor prognosis given multiple comorbidities. 2/26--Per TOC-- patient's insurance has denied payment. TOC has discussed with son for private pay. Patient [facility] bed [facility].  2/27--per toc--son has made payment. Facility does not have air mattress today. Per TOC patient can discharge tomorrow.  Dispo: The patient is from: [facility]              Anticipated d/c is [id number] [place] Monday 2/28 per [facility]              Anticipated d/c date is: 2/28              Patient currently best optimized for discharge    TOTAL TIME TAKING CARE OF THIS PATIENT: *20* minutes.  >50% time spent on counselling and coordination of care  Note: This dictation was prepared with Dragon dictation along with smaller phrase technology. Any transcriptional errors that result from this process are unintentional.  Fritzi Mandes M.D    [facility]   CC: Primary care physician; Biagio Borg, MDPatient ID: Bonnetta Barry, female   DOB: May 02, 1930, 87 y.o.   MRN: [id number]

## 2020-08-09 NOTE — Plan of Care (Signed)
Continuing with plan of care. 

## 2020-08-10 DIAGNOSIS — G9341 Metabolic encephalopathy: Secondary | ICD-10-CM | POA: Diagnosis not present

## 2020-08-10 DIAGNOSIS — Z7189 Other specified counseling: Secondary | ICD-10-CM | POA: Diagnosis not present

## 2020-08-10 DIAGNOSIS — N179 Acute kidney failure, unspecified: Secondary | ICD-10-CM | POA: Diagnosis not present

## 2020-08-10 DIAGNOSIS — E43 Unspecified severe protein-calorie malnutrition: Secondary | ICD-10-CM | POA: Diagnosis not present

## 2020-08-10 DIAGNOSIS — L89154 Pressure ulcer of sacral region, stage 4: Secondary | ICD-10-CM | POA: Diagnosis not present

## 2020-08-10 LAB — RESP PANEL BY RT-PCR (FLU A&B, COVID) ARPGX2
Influenza A by PCR: NEGATIVE
Influenza B by PCR: NEGATIVE
SARS Coronavirus 2 by RT PCR: NEGATIVE

## 2020-08-10 LAB — GLUCOSE, CAPILLARY: Glucose-Capillary: 79 mg/dL (ref 70–99)

## 2020-08-10 NOTE — Progress Notes (Signed)
[name] discharged to [facility] per MD order. An After Visit Summary was printed and given to the EMT. Patient escorted via ambulance.    Allergies as of 08/10/2020      Reactions   Ace Inhibitors Other (See Comments)   Tolerates lisinopril at home. Pt does not recall allergy.   Atorvastatin    REACTION: myalgias   Oxycodone-acetaminophen    REACTION: confusion, fatigue   Rosuvastatin    REACTION: leg weakness   Simvastatin    REACTION: leg weakness   Codeine Nausea And Vomiting, Rash   Sulfonamide Derivatives Hives, Rash      Medication List    STOP taking these medications   furosemide 40 MG tablet Commonly known as: LASIX   HYDROcodone-acetaminophen 5-325 MG tablet Commonly known as: NORCO/VICODIN   Medihoney Wound/Burn Dressing Gel     TAKE these medications   acetaminophen 500 MG tablet Commonly known as: TYLENOL Take 1,000 mg by mouth every 8 (eight) hours.   acidophilus Caps capsule Take 1 capsule by mouth at bedtime.   allopurinol 100 MG tablet Commonly known as: ZYLOPRIM Take 100 mg by mouth every other day. What changed: Another medication with the same name was removed. Continue taking this medication, and follow the directions you see here.   amLODipine 5 MG tablet Commonly known as: NORVASC TAKE 1 TABLET BY MOUTH ONCE A DAY   aspirin 325 MG EC tablet Take 1 tablet (325 mg total) by mouth every other day.   feeding supplement Liqd Take 237 mLs by mouth 3 (three) times daily between meals.   gabapentin 100 MG capsule Commonly known as: NEURONTIN Take 100 mg by mouth 2 (two) times daily.   ipratropium-albuterol 0.5-2.5 (3) MG/3ML Soln Commonly known as: DUONEB Take 3 mLs by nebulization in the morning and at bedtime.   lisinopril 10 MG tablet Commonly known as: ZESTRIL Take 1 tablet (10 mg total) by mouth daily. What changed:   medication strength  how much to take   MILK OF MAGNESIA PO Take 30 mLs by mouth 2 (two) times  daily.   mirtazapine 15 MG tablet Commonly known as: REMERON Take 1 tablet (15 mg total) by mouth at bedtime.   morphine CONCENTRATE 10 mg / 0.5 ml concentrated solution Take 0.25 mLs (5 mg total) by mouth every 4 (four) hours as needed for up to 5 days for severe pain.   MULTIVITAMIN ADULT PO Take 1 tablet by mouth daily.   vitamin C 250 MG tablet Commonly known as: ASCORBIC ACID Take 250 mg by mouth 2 (two) times daily.            Discharge Care Instructions  (From admission, onward)         Start     Ordered   08/10/20 0000  Discharge wound care:       Comments: Wound care to left heel healing Stage 3 pressure injury:  Cleanse with soap and water, rinse and pat dry. Cover wound with folded white petrolatum (Vaseline) gauze, top with dry gauze and cover with silicone foam. Place foot into Prevalon heel boot.  Measurement:9 cm x 16 cm x 3.2 cm Wound bed: 25% bone 60% pale pink, yellow nongranulating 15% red  Wound bed has declined today.  Drainage (amount, consistency, odor) moderate serosanguinous  No odor.  Periwound:nonblancahble erythema has improved Dressing procedure/placement/frequency: COntinue VAc therapy.  Bridging to trochanter to offload pressure  CHange Mon/Wed/Fri   08/10/20 0818   08/09/20 0000  Discharge wound care:       Comments: Wound care to left heel healing Stage 3 pressure injury:  Cleanse with soap and water, rinse and pat dry. Cover wound with folded white petrolatum (Vaseline) gauze, top with dry gauze and cover with silicone foam. Place foot into Prevalon heel boot.  Wound type:stage 4 sacral wound.  NPWT dressing change and bridged to left trochanter.  Measurement: unchanged.  New area at 9 clock, intact nonblanchable erythema 1.5 cm x 5 cm  Wound QPR:[id number] Drainage (amount, consistency, odor) minimal serosanguinous  No odor.  Periwound:Note erythema above  Protected with barrier ring to this area.   Dressing procedure/placement/frequency:2  pieces black foam plus foam for bridge.  Intact skin protected with drape. Covered and seal immediately achieved at 125 mmHg  Change Mon/Wed/Fri.    Will follow.   08/09/20 1105          Vitals:   08/10/20 0438 08/10/20 1355  BP: (!) 94/51 (!) 142/59  Pulse: 95 100  Resp: 16 18  Temp: (!) 97.4 F (36.3 C) 97.7 F (36.5 C)  SpO2: 98% 98%    .  Fuller Mandril, RN

## 2020-08-10 NOTE — Progress Notes (Signed)
Attempted to call report with [facility]. No answer. Left voice mail at [phone]. [facility] scheduled for pick up at 1:30pm. Patient is ready for transport.   Fuller Mandril, RN

## 2020-08-10 NOTE — Discharge Instructions (Signed)
Diet :dysphagia 1 thing liquids  Speech therapist and dietitian to follow at the facility

## 2020-08-10 NOTE — TOC Transition Note (Signed)
Transition of Care [facility]) - CM/SW Discharge Note   Patient Details  Name: [name] MRN: [id number] Date of Birth: 01-08-1930  [facility] CM/SW Contact:  Pete Pelt, RN Phone Number: 08/10/2020, 11:56 AM   Clinical Narrative:   Patient has orders to discharge to [facility] today.  RN will call report to [phone] (room 124B).  Transport has been set up through [facility] for 1330pm.  No further concerns at this time.  TOC signing off.    Final next level of care: Skilled Nursing Facility Barriers to Discharge: Barriers Resolved   Patient Goals and CMS Choice   CMS Medicare.gov Compare Post Acute Care list provided to:: Patient Represenative (must comment) (Son-[name]) Choice offered to / list presented to : Adult Children  Discharge Placement   Existing PASRR number confirmed : 08/09/20          Patient chooses bed at: Other - please specify in the comment section below: [facility]. [place], [place]) Patient to be transferred to facility by: [facility] Name of family member notified: Son-[name] Patient and family notified of of transfer: 08/10/20  Discharge Plan and Services                                     Social Determinants of Health (SDOH) Interventions     Readmission Risk Interventions Readmission Risk Prevention Plan 07/20/2020  Transportation Screening Complete  Social Work Consult for [place] Planning/Counseling Complete  Medication Review Press photographer) Complete  Some recent data might be hidden

## 2020-08-10 NOTE — Progress Notes (Signed)
COVID swab sent to lab.   [name] Marie Daneille Desilva, RN  

## 2020-08-10 NOTE — Discharge Summary (Signed)
[facility] [place] NAME: [name]    MR#:  [id number]  DATE OF BIRTH:  01-Dec-1929  DATE OF ADMISSION:  07/18/2020 ADMITTING PHYSICIAN: Toy Baker, MD  DATE OF DISCHARGE: 08/10/2020  PRIMARY CARE PHYSICIAN: Biagio Borg, MD    ADMISSION DIAGNOSIS:  Dehydration [E86.0] Acute renal failure (ARF) ([name]) [N17.9] AKI (acute kidney injury) ([place]) [N17.9] Hypotension, unspecified hypotension type [I95.9]  DISCHARGE DIAGNOSIS:  acute metabolic encephalopathy with hospital acquired delirium improving stage IV sacral decubitus with left heel decubitus ulcer severe protein calorie malnutrition/adult failure to thrive acute intraventricular hemorrhage acute renal failure-- resolved SECONDARY DIAGNOSIS:   Past Medical History:  Diagnosis Date  . Anemia   . Anxiety   . Atrial flutter ([place])    "sometimes"  . Blood transfusion   . Breast cancer ([place])    right breast  . Bronchitis   . Chronic kidney disease    occ uti's  . CKD (chronic kidney disease) stage 3, GFR 30-59 ml/min ([name])   . Colon polyps    hyperplastic  . CVA (cerebral infarction)   . Diverticulosis   . DVT of leg (deep venous thrombosis) ([name])    left; S/P OR  . Dysrhythmia    dr bensimhon   . Esophageal stricture   . GERD (gastroesophageal reflux disease)   . Gout   . Hiatal hernia    4 cm  . History of radiation therapy 02/15/12-04/02/12   right breast 4680 cGy/26 sessions, right boost=1400cGy /7 sessions  . Hyperlipidemia   . Hypertension   . IBS (irritable bowel syndrome)   . Kidney stones 1990's  . Morbid obesity ([place])   . Osteoarthritis   . Personal history of radiation therapy   . Pneumonia    "walking"  . PONV (postoperative nausea and vomiting)   . Shingles 02/21/2011  . Shortness of breath on exertion   . Skin cancer   . Stroke [facility]) 2002   residual:  "little tingling in palm of left hand"  . UTI (lower urinary tract infection)    "I've had  a few"    HOSPITAL COURSE:   [name] Coveyis a 87 y.o.femalewith medical history significant of dementia, diastolic CHF,s/p Lumbar fusion due to L3 frx and repair of traumatic CSF leakOct 2021,anemia, atrial flutter, breast cancer status post radiation therapy, CKD stage III, history of CVA, history of DVT left leg postoperatively, esophageal stricture,GERD, C. difficile infection,severe osteopenia,falls, stage IV decubitus ulcer, unstageable left heel decubitus ulcer. She was brought to the hospital because of hypotension, decreased urine output, confusionandsomnolence. She was admitted to the hospital for acute kidney injury, hypotension and acute metabolic encephalopathy.   Acute metabolic encephalopathy Suspected hospital-acquired delirium CT head on 07/20/2020 showed-Mildly motion degraded examination. Trace hyperdensity within the lateral ventricle occipital horns bilaterally, likelyreflecting acute intraventricular hemorrhage. Mild generalized atrophy of the brain with chronic small vessel ischemic disease. --No further inpatient intervention per neurosurgeon.  Stage IV sacral decubitus ulcer, unstageable left heel decubitus ulcer -Status post debridement on 07/22/2020 -Wound VAC will be replaced by wound care nurse on 08/10/2020. -General surgeon has signed off. -Wound growing VRE and MRSA -Patient is nota candidate for diverting colostomy per general surgeon. -Completed unasyn and doxycycline. --foley to help with wound healing--pt will discharge with it.  Severe protein-calorie malnutrition, poor oral intake Adult Failure to thrive -Patient has intermittent labile mental status  -Given her dementia there is concern that she will remove NG tube or  PEG tube and so these are not feasible at the moment.  --She intermittently eats, Sporadically drinks ensure.   Hypernatremia,hypokalemia Hypomagnesemia Improved   Acute intraventricular  hemorrhage-Trace No further work-up from neurosurgeon.  AKI(acute kidney injury) ([place]) -appears to be prerenal-dehydration Lisinopril and Lasix have been held. Creat 0.98  Other comorbidities include chronic diastolic CHF, hypertension, hyperlipidemia, type II DM (hemoglobin A1c 5.6) Overall poor prognosis. Palliative care consultation appreciated  Patient has multiple comorbidities. She is at a high risk for readmission.           Diet Order                      DIET - DYS 1 Room service appropriate? No; Fluid consistency: ThinDiet effective now                Family communicationfamily aware of pt's discharge plan  CODE STATUS:DNR DVT Prophylaxis:SCD Level of care:Med-Surg Status is: Inpatient  Discussed with patient's son [name]. Plan is to work on discharge her to rehab. Family wants to continue current management. Patient PO intake is variable. Son understands long term poor prognosis given multiple comorbidities. 2/26--Per TOC-- patient's insurance has denied payment. TOC has discussed with son for private pay. Patient does have a bed at [facility]. 2/27--per TOC--son has madepayment. Facility does not have air mattress today. Per TOC patient can discharge tomorrow.  [facility]: [facility] [facility] Anticipated d/c date is: 2/28 Patient currently best optimized for discharge   CONSULTS OBTAINED:  Treatment Team:  Deetta Perla, MD  DRUG ALLERGIES:   Allergies  Allergen Reactions  . Ace Inhibitors Other (See Comments)    Tolerates lisinopril at home. Pt does not recall allergy.  . Atorvastatin     REACTION: myalgias  . Oxycodone-Acetaminophen     REACTION: confusion, fatigue  . Rosuvastatin     REACTION: leg weakness  . Simvastatin     REACTION: leg weakness  . Codeine Nausea And Vomiting  and Rash  . Sulfonamide Derivatives Hives and Rash    DISCHARGE MEDICATIONS:   Allergies as of 08/10/2020      Reactions   Ace Inhibitors Other (See Comments)   Tolerates lisinopril at home. Pt does not recall allergy.   Atorvastatin    REACTION: myalgias   Oxycodone-acetaminophen    REACTION: confusion, fatigue   Rosuvastatin    REACTION: leg weakness   Simvastatin    REACTION: leg weakness   Codeine Nausea And Vomiting, Rash   Sulfonamide Derivatives Hives, Rash      Medication List    STOP taking these medications   furosemide 40 MG tablet Commonly known as: LASIX   HYDROcodone-acetaminophen 5-325 MG tablet Commonly known as: NORCO/VICODIN   Medihoney Wound/Burn Dressing Gel     TAKE these medications   acetaminophen 500 MG tablet Commonly known as: TYLENOL Take 1,000 mg by mouth every 8 (eight) hours.   acidophilus Caps capsule Take 1 capsule by mouth at bedtime.   allopurinol 100 MG tablet Commonly known as: ZYLOPRIM Take 100 mg by mouth every other day. What changed: Another medication with the same name was removed. Continue taking this medication, and follow the directions you see here.   amLODipine 5 MG tablet Commonly known as: NORVASC TAKE 1 TABLET BY MOUTH ONCE A DAY   aspirin 325 MG EC tablet Take 1 tablet (325 mg total) by mouth every other day.  feeding supplement Liqd Take 237 mLs by mouth 3 (three) times daily between meals.   gabapentin 100 MG capsule Commonly known as: NEURONTIN Take 100 mg by mouth 2 (two) times daily.   ipratropium-albuterol 0.5-2.5 (3) MG/3ML Soln Commonly known as: DUONEB Take 3 mLs by nebulization in the morning and at bedtime.   lisinopril 10 MG tablet Commonly known as: ZESTRIL Take 1 tablet (10 mg total) by mouth daily. What changed:   medication strength  how much to take   MILK OF MAGNESIA PO Take 30 mLs by mouth 2 (two) times daily.   mirtazapine 15 MG tablet Commonly known as: REMERON Take 1  tablet (15 mg total) by mouth at bedtime.   morphine CONCENTRATE 10 mg / 0.5 ml concentrated solution Take 0.25 mLs (5 mg total) by mouth every 4 (four) hours as needed for up to 5 days for severe pain.   MULTIVITAMIN ADULT PO Take 1 tablet by mouth daily.   vitamin C 250 MG tablet Commonly known as: ASCORBIC ACID Take 250 mg by mouth 2 (two) times daily.            Discharge Care Instructions  (From admission, onward)         Start     Ordered   08/10/20 0000  Discharge wound care:       Comments: Wound care to left heel healing Stage 3 pressure injury:  Cleanse with soap and water, rinse and pat dry. Cover wound with folded white petrolatum (Vaseline) gauze, top with dry gauze and cover with silicone foam. Place foot into Prevalon heel boot.  Measurement:9 cm x 16 cm x 3.2 cm Wound bed: 25% bone 60% pale pink, yellow nongranulating 15% red  Wound bed has declined today.  Drainage (amount, consistency, odor) moderate serosanguinous  No odor.  Periwound:nonblancahble erythema has improved Dressing procedure/placement/frequency: COntinue VAc therapy.  Bridging to trochanter to offload pressure  CHange Mon/Wed/Fri   08/10/20 0818   08/09/20 0000  Discharge wound care:       Comments: Wound care to left heel healing Stage 3 pressure injury:  Cleanse with soap and water, rinse and pat dry. Cover wound with folded white petrolatum (Vaseline) gauze, top with dry gauze and cover with silicone foam. Place foot into Prevalon heel boot.  Wound type:stage 4 sacral wound.  NPWT dressing change and bridged to left trochanter.  Measurement: unchanged.  New area at 9 clock, intact nonblanchable erythema 1.5 cm x 5 cm  Wound DUK:GURKYHCWC Drainage (amount, consistency, odor) minimal serosanguinous  No odor.  Periwound:Note erythema above  Protected with barrier ring to this area.   Dressing procedure/placement/frequency:2 pieces black foam plus foam for bridge.  Intact skin protected with  drape. Covered and seal immediately achieved at 125 mmHg  Change Mon/Wed/Fri.    Will follow.   08/09/20 1105          If you experience worsening of your admission symptoms, develop shortness of breath, life threatening emergency, suicidal or homicidal thoughts you must seek medical attention immediately by calling 911 or calling your MD immediately  if symptoms less severe.  You Must read complete instructions/literature along with all the possible adverse reactions/side effects for all the Medicines you take and that have been prescribed to you. Take any new Medicines after you have completely understood and accept all the possible adverse reactions/side effects.   Please note  You were cared for by a hospitalist during your hospital stay. If you have any questions  about your discharge medications or the care you received while you were in the hospital after you are discharged, you can call the unit and asked to speak with the hospitalist on call if the hospitalist that took care of you is not available. Once you are discharged, your primary care physician will handle any further medical issues. Please note that NO REFILLS for any discharge medications will be authorized once you are discharged, as it is imperative that you return to your primary care physician (or establish a relationship with a primary care physician if you do not have one) for your aftercare needs so that they can reassess your need for medications and monitor your lab values. Today   SUBJECTIVE   Patient more awake and alert. She ate her bowl of cream of wheat this morning. Wound VAC to be changed today  VITAL SIGNS:  Blood pressure (!) 94/51, pulse 95, temperature (!) 97.4 F (36.3 C), temperature source Axillary, resp. rate 16, height 5\' 5"  (1.651 m), weight 98 kg, SpO2 98 %.  I/O:    Intake/Output Summary (Last 24 hours) at 08/10/2020 0826 Last data filed at 08/10/2020 0500 Gross per 24 hour  Intake -  Output  850 ml  Net -850 ml    PHYSICAL EXAMINATION:  Limited exam GENERAL:87 y.o.-year-old patient lying in the bed with no acute distress. Chronically ill, malnourished LUNGS: Normal breath sounds bilaterally No use of accessory muscles of respiration.  CARDIOVASCULAR: S1, S2 normal.  ABDOMEN: Soft, nontender, nondistended. Foley+ EXTREMITIES:ulcer on the heel as below  NEUROLOGIC: non focal  PSYCHIATRIC: patient is more awake and alert today SKIN: Pressure Injury 07/19/20 Sacrum Medial Unstageable - Full thickness tissue loss in which the base of the injury is covered by slough (yellow, tan, gray, green or brown) and/or eschar (tan, brown or black) in the wound bed. small amount of tunneling with (Active)  07/19/20 0211  Location: Sacrum  Location Orientation: Medial  Staging: Unstageable - Full thickness tissue loss in which the base of the injury is covered by slough (yellow, tan, gray, green or brown) and/or eschar (tan, brown or black) in the wound bed.  Wound Description (Comments): small amount of tunneling with eschar  Present on Admission: Yes    Pressure Injury 07/19/20 Heel Left Unstageable - Full thickness tissue loss in which the base of the injury is covered by slough (yellow, tan, gray, green or brown) and/or eschar (tan, brown or black) in the wound bed. eschar around edge of injury (Active)  07/19/20 0214  Location: Heel  Location Orientation: Left  Staging: Unstageable - Full thickness tissue loss in which the base of the injury is covered by slough (yellow, tan, gray, green or brown) and/or eschar (tan, brown or black) in the wound bed.  Wound Description (Comments): eschar around edge of injury  Present on Admission: Yes      DATA REVIEW:   CBC  No results for input(s): WBC, HGB, HCT, PLT in the last 168 hours.  Chemistries  Recent Labs  Lab 08/05/20 0405  NA 137  K 3.1*  CL 109  CO2 19*  GLUCOSE 100*  BUN 14  CREATININE 0.98  CALCIUM 8.8*  MG  1.8    Microbiology Results   No results found for this or any previous visit (from the past 240 hour(s)).  RADIOLOGY:  No results found.   CODE STATUS:     Code Status Orders  (From admission, onward)  Start     Ordered   07/24/20 1054  Do not attempt resuscitation (DNR)  Continuous       Question Answer Comment  In the event of cardiac or respiratory ARREST Do not call a "code blue"   In the event of cardiac or respiratory ARREST Do not perform Intubation, CPR, defibrillation or ACLS   In the event of cardiac or respiratory ARREST Use medication by any route, position, wound care, and other measures to relive pain and suffering. May use oxygen, suction and manual treatment of airway obstruction as needed for comfort.      07/24/20 1053        Code Status History    Date Active Date Inactive Code Status Order ID Comments User Context   07/23/2020 1423 07/24/2020 1053 Full Code [id number]  Asencion Gowda, NP Inpatient   07/23/2020 1422 07/23/2020 1423 DNR [id number]  Asencion Gowda, NP Inpatient   07/18/2020 2348 07/23/2020 1422 Full Code [id number]  Toy Baker, MD ED   05/29/2019 1607 05/31/2019 1658 Full Code [id number]  Reubin Milan, MD ED   06/18/2014 0523 06/23/2014 1843 Full Code [id number]  Shanda Howells, MD ED   10/10/2013 1945 10/14/2013 1330 Full Code [id number]  Theressa Millard, MD ED   08/12/2013 2033 08/29/2013 1722 Full Code [id number]  Janece Canterbury, MD Inpatient   12/19/2011 1222 12/21/2011 1601 Full Code [id number]  Clearence Ped, RN Inpatient   06/29/2011 1541 07/01/2011 1618 Full Code [id number]  Pedro Earls, MD Inpatient   Advance Care Planning Activity    Advance Directive Documentation   Flowsheet Row Most Recent Value  Type of Advance Directive Healthcare Power of Attorney  Pre-existing out of facility DNR order (yellow form or pink MOST form) -  "MOST" Form in Place? -       TOTAL TIME TAKING CARE OF THIS PATIENT: *35* minutes.     Fritzi Mandes M.D  [facility]    CC: Primary care physician; Biagio Borg, MD

## 2020-08-10 NOTE — Discharge Summary (Deleted)
[facility] [place] NAME: Ronda Rajkumar    MR#:  [id number]  DATE OF BIRTH:  [birth year]  SUBJECTIVE:    REVIEW OF SYSTEMS:   ROS Tolerating Diet: Tolerating PT:   DRUG ALLERGIES:   Allergies  Allergen Reactions  . Ace Inhibitors Other (See Comments)    Tolerates lisinopril at home. Pt does not recall allergy.  . Atorvastatin     REACTION: myalgias  . Oxycodone-Acetaminophen     REACTION: confusion, fatigue  . Rosuvastatin     REACTION: leg weakness  . Simvastatin     REACTION: leg weakness  . Codeine Nausea And Vomiting and Rash  . Sulfonamide Derivatives Hives and Rash    VITALS:  Blood pressure (!) 94/51, pulse 95, temperature (!) 97.4 F (36.3 C), temperature source Axillary, resp. rate 16, height 5\' 5"  (1.651 m), weight 98 kg, SpO2 98 %.  PHYSICAL EXAMINATION:   Limited exam GENERAL:  87 y.o.-year-old patient lying in the bed with no acute distress. Chronically ill, malnourished LUNGS: Normal breath sounds bilaterally No use of accessory muscles of respiration.  CARDIOVASCULAR: S1, S2 normal.  ABDOMEN: Soft, nontender, nondistended. Foley+ EXTREMITIES:ulcer on the heel as below  NEUROLOGIC: non focal  PSYCHIATRIC:  patient is more awake and alert today SKIN:  Pressure Injury 07/19/20 Sacrum Medial Unstageable - Full thickness tissue loss in which the base of the injury is covered by slough (yellow, tan, gray, green or brown) and/or eschar (tan, brown or black) in the wound bed. small amount of tunneling with  (Active)  07/19/20 0211  Location: Sacrum  Location Orientation: Medial  Staging: Unstageable - Full thickness tissue loss in which the base of the injury is covered by slough (yellow, tan, gray, green or brown) and/or eschar (tan, brown or black) in the wound bed.  Wound Description (Comments): small amount of tunneling with eschar  Present on Admission: Yes     Pressure Injury 07/19/20 Heel Left Unstageable  - Full thickness tissue loss in which the base of the injury is covered by slough (yellow, tan, gray, green or brown) and/or eschar (tan, brown or black) in the wound bed. eschar around edge of injury (Active)  07/19/20 0214  Location: Heel  Location Orientation: Left  Staging: Unstageable - Full thickness tissue loss in which the base of the injury is covered by slough (yellow, tan, gray, green or brown) and/or eschar (tan, brown or black) in the wound bed.  Wound Description (Comments): eschar around edge of injury  Present on Admission: Yes       LABORATORY PANEL:  CBC No results for input(s): WBC, HGB, HCT, PLT in the last 168 hours.  Chemistries  Recent Labs  Lab 08/05/20 0405  NA 137  K 3.1*  CL 109  CO2 19*  GLUCOSE 100*  BUN 14  CREATININE 0.98  CALCIUM 8.8*  MG 1.8   Cardiac Enzymes No results for input(s): TROPONINI in the last 168 hours. RADIOLOGY:  No results found. ASSESSMENT AND PLAN:  Lashawnna A Coveyis a 87 y.o.femalewith medical history significant of dementia, diastolic CHF,s/p Lumbar fusion due to L3 frx and repair of traumatic CSF leakOct 2021,anemia, atrial flutter, breast cancer status post radiation therapy, CKD stage III, history of CVA, history of DVT left leg postoperatively, esophageal stricture,GERD, C. difficile infection,severe osteopenia,falls, stage IV decubitus ulcer, unstageable left heel decubitus ulcer. She was brought to the hospital because of hypotension, decreased urine output, confusionandsomnolence. She was admitted to  the hospital for acute kidney injury, hypotension and acute metabolic encephalopathy.   Acute metabolic encephalopathy Suspected hospital-acquired delirium CT head on 07/20/2020 showed-Mildly motion degraded examination. Trace hyperdensity within the lateral ventricle occipital horns bilaterally, likely reflecting acute intraventricular hemorrhage. Mild generalized atrophy of the brain with chronic  small vessel ischemic disease. --No further inpatient intervention per neurosurgeon.  Stage IV sacral decubitus ulcer, unstageable left heel decubitus ulcer -Status post debridement on 07/22/2020 -Wound VAC will be replaced by wound care nurse on 08/10/2020. -General surgeon has signed off. -Wound growing VRE and MRSA -Patient is not a candidate for diverting colostomy per general surgeon. -Completed unasyn and doxycycline. --foley to help with wound healing--pt will discharge with it.  Severe protein-calorie malnutrition, poor oral intake Adult Failure to thrive -Patient has intermittent labile mental status    -Given her dementia there is  concern that she will remove NG tube or PEG tube and so these are not feasible at the moment.  --She intermittently eats, Sporadically drinks ensure.   Hypernatremia,hypokalemia Hypomagnesemia Improved   Acute intraventricular hemorrhage-Trace No further work-up from neurosurgeon.  AKI(acute kidney injury) ([place]) -appears to be prerenal-dehydration Lisinopril and Lasix have been held. Creat 0.98  Other comorbidities include chronic diastolic CHF, hypertension, hyperlipidemia, type II DM (hemoglobin A1c 5.6) Overall poor prognosis. Palliative care consultation appreciated  Patient has multiple comorbidities. She is at a high risk for readmission.           Diet Order                      DIET - DYS 1 Room service appropriate? No; Fluid consistency: ThinDiet effective now                Family communication family aware of pt's discharge plan  CODE STATUS: DNR DVT Prophylaxis :SCD Level of care: Med-Surg Status is: Inpatient  Discussed with patient's son Alajiah Dutkiewicz. Plan is to work on discharge her to rehab. Family wants to continue current management. Patient PO intake is variable. Son understands long term poor prognosis given multiple comorbidities. 2/26--Per TOC-- patient's insurance has  denied payment. TOC has discussed with son for private pay. Patient does have a bed at [facility].  2/27--per TOC--son has made payment. Facility does not have air mattress today. Per TOC patient can discharge tomorrow.  Dispo: The [facility] is from: [facility]  Anticipated d/c [id number] [place] 2[facility]28 per [facility]  Anticipated d/c date is: 2/28  Patient currently best optimized for discharge              TOTAL TIME TAKING CARE OF THIS PATIENT: *35* minutes.  >50% time spent on counselling and coordination of care  Note: This dictation was prepared with Dragon dictation along with smaller phrase technology. Any transcriptional errors that result from this process are unintentional.  Fritzi Mandes M.D    [facility]   CC: Primary care physician; Biagio Borg, MD

## 2020-08-10 NOTE — Progress Notes (Signed)
Daily Progress Note   Patient Name: [name]       Date: 08/10/2020 DOB: August 07, 1929  Age: 87 y.o. MRN#: [id number] Attending Physician: Fritzi Mandes, MD Primary Care Physician: Biagio Borg, MD Admit Date: 07/18/2020  Reason for Consultation/Follow-up: Establishing goals of care  Subjective: Patient is resting in bed with covers pulled up to her chin, and a stuffed bear at her side. No family at bedside. Lunch is sitting on her tray table with 1 bite of ice cream missing, and the rest of the tray appears untouched. Plans for discharge to SNF. Would recommend palliative to follow outpatient at facility, as son wanted patient's husband to be able to see her daily at a facility near by - hoping the frequent visitation would improve her status, and then they could make decisions from there.     Length of Stay: 23  Current Medications: Scheduled Meds:  . allopurinol  100 mg Oral Daily  . amLODipine  5 mg Oral Daily  . vitamin C  250 mg Oral BID  . Chlorhexidine Gluconate Cloth  6 each Topical Daily  . feeding supplement  237 mL Oral TID BM  . mirtazapine  15 mg Oral QHS  . multivitamin with minerals  1 tablet Oral Daily    Continuous Infusions:   PRN Meds: acetaminophen, albuterol, hydrALAZINE, HYDROcodone-acetaminophen, ipratropium-albuterol, morphine injection  Physical Exam Constitutional:      Comments: Eyes closed.   Pulmonary:     Effort: Pulmonary effort is normal.             Vital Signs: BP (!) 94/51 (BP Location: Right Arm)   Pulse 95   Temp (!) 97.4 F (36.3 C) (Axillary)   Resp 16   Ht 5\' 5"  (1.651 m)   Wt 98 kg   SpO2 98%   BMI 35.95 kg/m  SpO2: SpO2: 98 % O2 Device: O2 Device: Room Air O2 Flow Rate: O2 Flow Rate (L/min): 2 L/min  Intake/output  summary:   Intake/Output Summary (Last 24 hours) at 08/10/2020 1335 Last data filed at 08/10/2020 0500 Gross per 24 hour  Intake --  Output 850 ml  Net -850 ml   LBM: Last BM Date: 08/10/20 Baseline Weight: Weight: 98 kg Most recent weight: Weight: 98 kg       Flowsheet Rows  Flowsheet Row Most Recent Value  Intake Tab   Referral Department Hospitalist  Unit at Time of Referral Med/Surg Unit  Date Notified 07/21/20  Palliative Care Type New Palliative care  Reason for referral Clarify Goals of Care  Date of Admission 07/18/20  Date first seen by Palliative Care 07/23/20  # of days Palliative referral response time 2 Day(s)  # of days IP prior to Palliative referral 3  Clinical Assessment   Psychosocial & Spiritual Assessment   Palliative Care Outcomes       Patient Active Problem List   Diagnosis Date Noted  . Sacral decubitus ulcer, stage IV ([place]) 07/22/2020  . Dehydration 07/18/2020  . Hyperkalemia 07/18/2020  . Acute metabolic encephalopathy 60/73/7106  . Closed burst fracture of lumbar vertebra ([place]) 04/03/2020  . Acute respiratory failure with hypoxia and hypercapnia ([place]) 04/03/2020  . ATN (acute tubular necrosis) ([place]) 04/03/2020  . H/O bad fall 04/03/2020  . HCAP (healthcare-associated pneumonia) 04/03/2020  . Hematochezia 05/29/2019  . Diverticulosis   . Swelling of vagina 04/16/2019  . Skin lesion 03/26/2019  . Left leg cellulitis 01/15/2019  . Healed diabetic foot ulcer 12/01/2017  . Abnormal TSH 11/15/2016  . Preventative health care 04/20/2016  . Allergic rhinitis 04/20/2016  . Dysuria 12/09/2014  . External hemorrhoids without complication 26/94/8546  . Left ear pain 07/08/2014  . Hip pain   . GIB (gastrointestinal bleeding) 06/18/2014  . UTI (lower urinary tract infection) 06/18/2014  . Abdominal pain   . Lower GI bleed   . Arthritis 02/18/2014  . Enteritis due to Clostridium difficile 10/12/2013  . CKD (chronic kidney disease) stage 3, GFR  30-59 ml/min ([name]) 10/11/2013  . Chronic diastolic CHF (congestive heart failure) ([place]) 10/11/2013  . Protein-calorie malnutrition, severe ([place]) 10/11/2013  . Hx of necrotizing fasciitis 10/10/2013  . GI bleed 10/10/2013  . AKI (acute kidney injury) ([place]) 10/10/2013  . Cough 09/10/2013  . Rectal bleeding 09/10/2013  . Acute on chronic diastolic CHF (congestive heart failure) ([place]) 08/31/2013  . Rash and nonspecific skin eruption 08/27/2013  . Pulmonary edema 08/25/2013  . Severe sepsis with septic shock ([place]) 08/13/2013  . Necrotizing fasciitis of perineum 08/12/2013  . Acute renal failure (ARF) ([place]) 08/12/2013  . Wheezing 06/07/2013  . Wound healing, delayed 01/26/2012  . Diverticulosis of colon with hemorrhage 12/19/2011  . Acute blood loss anemia 12/19/2011  . Malignant neoplasm of upper-outer quadrant of right breast in female, estrogen receptor positive ([place]) 08/01/2011  . Breast cancer, right breast-lobular s/p lumpectomy and sentinel node biopsy 06/30/2011  . Shingles 02/21/2011  . Bile duct calculus with nonacute cholecystitis 01/21/2011  . Morbid obesity ([place]) 01/21/2011  . TREMOR 08/17/2010  . Diabetes ([place]) 01/09/2008  . PERIPHERAL NEUROPATHY 10/17/2007  . FATIGUE 10/17/2007  . COLONIC POLYPS, HX OF 02/12/2007  . Hyperlipidemia 02/10/2007  . Gout 02/10/2007  . ANXIETY 02/10/2007  . Essential hypertension 02/10/2007  . CVA 02/10/2007  . DIVERTICULOSIS, COLON 02/10/2007  . IRRITABLE BOWEL, PREDOMINANTLY CONSTIPATION 02/10/2007    Palliative Care Assessment & Plan    Recommendations/Plan: Recommend outpatient palliative to follow at facility.    Code Status:    Code Status Orders  (From admission, onward)         Start     Ordered   07/24/20 1054  Do not attempt resuscitation (DNR)  Continuous       Question Answer Comment  In the event of cardiac or respiratory ARREST Do not call a "code blue"  In the event of cardiac or respiratory ARREST Do not  perform Intubation, CPR, defibrillation or ACLS   In the event of cardiac or respiratory ARREST Use medication by any route, position, wound care, and other measures to relive pain and suffering. May use oxygen, suction and manual treatment of airway obstruction as needed for comfort.      07/24/20 1053        Code Status History    Date Active Date Inactive Code Status Order ID Comments User Context   07/23/2020 1423 07/24/2020 1053 Full Code [id number]  Asencion Gowda, NP Inpatient   07/23/2020 1422 07/23/2020 1423 DNR [id number]  Asencion Gowda, NP Inpatient   07/18/2020 2348 07/23/2020 1422 Full Code [id number]  Toy Baker, MD ED   05/29/2019 1607 05/31/2019 1658 Full Code [id number]  Reubin Milan, MD ED   06/18/2014 0523 06/23/2014 1843 Full Code [id number]  Shanda Howells, MD ED   10/10/2013 1945 10/14/2013 1330 Full Code [id number]  Theressa Millard, MD ED   08/12/2013 2033 08/29/2013 1722 Full Code [id number]  Janece Canterbury, MD Inpatient   12/19/2011 1222 12/21/2011 1601 Full Code [id number]  Clearence Ped, RN Inpatient   06/29/2011 1541 07/01/2011 1618 Full Code [id number]  Pedro Earls, MD Inpatient   Advance Care Planning Activity    Advance Directive Documentation   Flowsheet Row Most Recent Value  Type of Advance Directive Healthcare Power of Attorney  Pre-existing out of facility DNR order (yellow form or pink MOST form) --  "MOST" Form in Place? --      Prognosis: Very poor    Thank you for allowing the Palliative Medicine Team to assist in the care of this patient.   Total Time 15 min Prolonged Time Billed  no      Greater than 50%  of this time was spent counseling and coordinating care related to the above assessment and plan.  Asencion Gowda, NP  Please contact Palliative Medicine Team phone at [phone] for questions and concerns.

## 2020-08-10 NOTE — Care Management Important Message (Addendum)
Important Message  Patient Details  Name: [name] MRN: [id number] Date of Birth: 1929/12/07   Medicare Important Message Given:  Yes  Reviewed with son, Alazia Crocket, at [phone].  Confirmed he received previous email sent securely to his attention at [email or web address], though stated he had some issues opening it.  Resent copy of Medicare IM to his attention and advised him to follow the instructions and click on the link included in the email in order to view original message and view attachment.  Explained this extra step was required as email needed to be sent securely to protect patient's PHI. Advised him to reach out should he have any issues viewing document.  [name] stated he would share this information with his brother.     Dannette Barbara 08/10/2020, 10:03 AM

## 2020-08-10 NOTE — Consult Note (Addendum)
[facility] Nurse wound follow up Vac dressing change performed to sacrum wound.  Pt was premedicated for pain but still moaned and complained of a mod amt discomfort.  Pt is currently incontinent of a large amt liquid stool and it is difficult to maintain a seal related to the close proximity of the Vac dressing to the rectum.  Wound type:stage 4 sacral wound Refer to progress notes from 2/25 for wound appearance and measurements; wound is unchanged since previous description. Drainage (amount, consistency, odor) moderate pink drainage in the cannister Periwound: intact skin surrounding. Dressing procedure/placement/frequency: Applied barrier ring near rectum to attempt to maintain a seal, then one piece black foam and bridged trackpad to hip to reduce pressure. Cont suction at 125 mm. Pt plans to discharge to another facility today, according to progress notes.  Julien Girt MSN, RN, [place], [place], [place]

## 2020-08-11 DIAGNOSIS — N183 Chronic kidney disease, stage 3 unspecified: Secondary | ICD-10-CM | POA: Diagnosis not present

## 2020-08-11 DIAGNOSIS — K5900 Constipation, unspecified: Secondary | ICD-10-CM | POA: Diagnosis not present

## 2020-08-11 DIAGNOSIS — R627 Adult failure to thrive: Secondary | ICD-10-CM | POA: Diagnosis not present

## 2020-08-11 DIAGNOSIS — M1990 Unspecified osteoarthritis, unspecified site: Secondary | ICD-10-CM | POA: Diagnosis not present

## 2020-08-11 DIAGNOSIS — L89620 Pressure ulcer of left heel, unstageable: Secondary | ICD-10-CM | POA: Diagnosis not present

## 2020-08-11 DIAGNOSIS — D649 Anemia, unspecified: Secondary | ICD-10-CM | POA: Diagnosis not present

## 2020-08-11 DIAGNOSIS — I10 Essential (primary) hypertension: Secondary | ICD-10-CM | POA: Diagnosis not present

## 2020-08-11 DIAGNOSIS — M109 Gout, unspecified: Secondary | ICD-10-CM | POA: Diagnosis not present

## 2020-08-11 DIAGNOSIS — L89154 Pressure ulcer of sacral region, stage 4: Secondary | ICD-10-CM | POA: Diagnosis not present

## 2020-08-12 DIAGNOSIS — L89620 Pressure ulcer of left heel, unstageable: Secondary | ICD-10-CM | POA: Diagnosis not present

## 2020-08-12 DIAGNOSIS — G9341 Metabolic encephalopathy: Secondary | ICD-10-CM | POA: Diagnosis not present

## 2020-08-12 DIAGNOSIS — R293 Abnormal posture: Secondary | ICD-10-CM | POA: Diagnosis not present

## 2020-08-12 DIAGNOSIS — R1312 Dysphagia, oropharyngeal phase: Secondary | ICD-10-CM | POA: Diagnosis not present

## 2020-08-12 DIAGNOSIS — M6281 Muscle weakness (generalized): Secondary | ICD-10-CM | POA: Diagnosis not present

## 2020-08-12 DIAGNOSIS — N179 Acute kidney failure, unspecified: Secondary | ICD-10-CM | POA: Diagnosis not present

## 2020-08-12 DIAGNOSIS — R278 Other lack of coordination: Secondary | ICD-10-CM | POA: Diagnosis not present

## 2020-08-12 DIAGNOSIS — L89154 Pressure ulcer of sacral region, stage 4: Secondary | ICD-10-CM | POA: Diagnosis not present

## 2020-08-13 DIAGNOSIS — D649 Anemia, unspecified: Secondary | ICD-10-CM | POA: Diagnosis not present

## 2020-08-13 DIAGNOSIS — I10 Essential (primary) hypertension: Secondary | ICD-10-CM | POA: Diagnosis not present

## 2020-08-13 DIAGNOSIS — R293 Abnormal posture: Secondary | ICD-10-CM | POA: Diagnosis not present

## 2020-08-13 DIAGNOSIS — R1312 Dysphagia, oropharyngeal phase: Secondary | ICD-10-CM | POA: Diagnosis not present

## 2020-08-13 DIAGNOSIS — L89154 Pressure ulcer of sacral region, stage 4: Secondary | ICD-10-CM | POA: Diagnosis not present

## 2020-08-13 DIAGNOSIS — M6281 Muscle weakness (generalized): Secondary | ICD-10-CM | POA: Diagnosis not present

## 2020-08-13 DIAGNOSIS — E43 Unspecified severe protein-calorie malnutrition: Secondary | ICD-10-CM | POA: Diagnosis not present

## 2020-08-13 DIAGNOSIS — L89620 Pressure ulcer of left heel, unstageable: Secondary | ICD-10-CM | POA: Diagnosis not present

## 2020-08-13 DIAGNOSIS — N183 Chronic kidney disease, stage 3 unspecified: Secondary | ICD-10-CM | POA: Diagnosis not present

## 2020-08-13 DIAGNOSIS — K449 Diaphragmatic hernia without obstruction or gangrene: Secondary | ICD-10-CM | POA: Diagnosis not present

## 2020-08-13 DIAGNOSIS — N179 Acute kidney failure, unspecified: Secondary | ICD-10-CM | POA: Diagnosis not present

## 2020-08-13 DIAGNOSIS — G9341 Metabolic encephalopathy: Secondary | ICD-10-CM | POA: Diagnosis not present

## 2020-08-13 DIAGNOSIS — R278 Other lack of coordination: Secondary | ICD-10-CM | POA: Diagnosis not present

## 2020-08-14 DIAGNOSIS — L89154 Pressure ulcer of sacral region, stage 4: Secondary | ICD-10-CM | POA: Diagnosis not present

## 2020-08-14 DIAGNOSIS — G9341 Metabolic encephalopathy: Secondary | ICD-10-CM | POA: Diagnosis not present

## 2020-08-14 DIAGNOSIS — N179 Acute kidney failure, unspecified: Secondary | ICD-10-CM | POA: Diagnosis not present

## 2020-08-14 DIAGNOSIS — M6281 Muscle weakness (generalized): Secondary | ICD-10-CM | POA: Diagnosis not present

## 2020-08-14 DIAGNOSIS — R1312 Dysphagia, oropharyngeal phase: Secondary | ICD-10-CM | POA: Diagnosis not present

## 2020-08-14 DIAGNOSIS — R278 Other lack of coordination: Secondary | ICD-10-CM | POA: Diagnosis not present

## 2020-08-14 DIAGNOSIS — R293 Abnormal posture: Secondary | ICD-10-CM | POA: Diagnosis not present

## 2020-08-15 DIAGNOSIS — G9341 Metabolic encephalopathy: Secondary | ICD-10-CM | POA: Diagnosis not present

## 2020-08-15 DIAGNOSIS — R293 Abnormal posture: Secondary | ICD-10-CM | POA: Diagnosis not present

## 2020-08-15 DIAGNOSIS — R1312 Dysphagia, oropharyngeal phase: Secondary | ICD-10-CM | POA: Diagnosis not present

## 2020-08-15 DIAGNOSIS — L89154 Pressure ulcer of sacral region, stage 4: Secondary | ICD-10-CM | POA: Diagnosis not present

## 2020-08-15 DIAGNOSIS — M6281 Muscle weakness (generalized): Secondary | ICD-10-CM | POA: Diagnosis not present

## 2020-08-15 DIAGNOSIS — R278 Other lack of coordination: Secondary | ICD-10-CM | POA: Diagnosis not present

## 2020-08-15 DIAGNOSIS — N179 Acute kidney failure, unspecified: Secondary | ICD-10-CM | POA: Diagnosis not present

## 2020-08-17 DIAGNOSIS — R293 Abnormal posture: Secondary | ICD-10-CM | POA: Diagnosis not present

## 2020-08-17 DIAGNOSIS — R278 Other lack of coordination: Secondary | ICD-10-CM | POA: Diagnosis not present

## 2020-08-17 DIAGNOSIS — R1312 Dysphagia, oropharyngeal phase: Secondary | ICD-10-CM | POA: Diagnosis not present

## 2020-08-17 DIAGNOSIS — M6281 Muscle weakness (generalized): Secondary | ICD-10-CM | POA: Diagnosis not present

## 2020-08-17 DIAGNOSIS — N179 Acute kidney failure, unspecified: Secondary | ICD-10-CM | POA: Diagnosis not present

## 2020-08-17 DIAGNOSIS — G9341 Metabolic encephalopathy: Secondary | ICD-10-CM | POA: Diagnosis not present

## 2020-08-17 DIAGNOSIS — L89154 Pressure ulcer of sacral region, stage 4: Secondary | ICD-10-CM | POA: Diagnosis not present

## 2020-08-18 DIAGNOSIS — R293 Abnormal posture: Secondary | ICD-10-CM | POA: Diagnosis not present

## 2020-08-18 DIAGNOSIS — K5900 Constipation, unspecified: Secondary | ICD-10-CM | POA: Diagnosis not present

## 2020-08-18 DIAGNOSIS — N179 Acute kidney failure, unspecified: Secondary | ICD-10-CM | POA: Diagnosis not present

## 2020-08-18 DIAGNOSIS — D649 Anemia, unspecified: Secondary | ICD-10-CM | POA: Diagnosis not present

## 2020-08-18 DIAGNOSIS — I10 Essential (primary) hypertension: Secondary | ICD-10-CM | POA: Diagnosis not present

## 2020-08-18 DIAGNOSIS — R1312 Dysphagia, oropharyngeal phase: Secondary | ICD-10-CM | POA: Diagnosis not present

## 2020-08-18 DIAGNOSIS — L89154 Pressure ulcer of sacral region, stage 4: Secondary | ICD-10-CM | POA: Diagnosis not present

## 2020-08-18 DIAGNOSIS — L89620 Pressure ulcer of left heel, unstageable: Secondary | ICD-10-CM | POA: Diagnosis not present

## 2020-08-18 DIAGNOSIS — R278 Other lack of coordination: Secondary | ICD-10-CM | POA: Diagnosis not present

## 2020-08-18 DIAGNOSIS — M1990 Unspecified osteoarthritis, unspecified site: Secondary | ICD-10-CM | POA: Diagnosis not present

## 2020-08-18 DIAGNOSIS — M6281 Muscle weakness (generalized): Secondary | ICD-10-CM | POA: Diagnosis not present

## 2020-08-18 DIAGNOSIS — K219 Gastro-esophageal reflux disease without esophagitis: Secondary | ICD-10-CM | POA: Diagnosis not present

## 2020-08-18 DIAGNOSIS — G9341 Metabolic encephalopathy: Secondary | ICD-10-CM | POA: Diagnosis not present

## 2020-08-18 DIAGNOSIS — M109 Gout, unspecified: Secondary | ICD-10-CM | POA: Diagnosis not present

## 2020-08-18 DIAGNOSIS — N183 Chronic kidney disease, stage 3 unspecified: Secondary | ICD-10-CM | POA: Diagnosis not present

## 2020-08-19 DIAGNOSIS — R278 Other lack of coordination: Secondary | ICD-10-CM | POA: Diagnosis not present

## 2020-08-19 DIAGNOSIS — N179 Acute kidney failure, unspecified: Secondary | ICD-10-CM | POA: Diagnosis not present

## 2020-08-19 DIAGNOSIS — L89154 Pressure ulcer of sacral region, stage 4: Secondary | ICD-10-CM | POA: Diagnosis not present

## 2020-08-19 DIAGNOSIS — G9341 Metabolic encephalopathy: Secondary | ICD-10-CM | POA: Diagnosis not present

## 2020-08-19 DIAGNOSIS — R293 Abnormal posture: Secondary | ICD-10-CM | POA: Diagnosis not present

## 2020-08-19 DIAGNOSIS — R1312 Dysphagia, oropharyngeal phase: Secondary | ICD-10-CM | POA: Diagnosis not present

## 2020-08-19 DIAGNOSIS — M6281 Muscle weakness (generalized): Secondary | ICD-10-CM | POA: Diagnosis not present

## 2020-08-19 DIAGNOSIS — L89620 Pressure ulcer of left heel, unstageable: Secondary | ICD-10-CM | POA: Diagnosis not present

## 2020-08-21 DIAGNOSIS — R1312 Dysphagia, oropharyngeal phase: Secondary | ICD-10-CM | POA: Diagnosis not present

## 2020-08-21 DIAGNOSIS — L89154 Pressure ulcer of sacral region, stage 4: Secondary | ICD-10-CM | POA: Diagnosis not present

## 2020-08-21 DIAGNOSIS — G9341 Metabolic encephalopathy: Secondary | ICD-10-CM | POA: Diagnosis not present

## 2020-08-21 DIAGNOSIS — R293 Abnormal posture: Secondary | ICD-10-CM | POA: Diagnosis not present

## 2020-08-21 DIAGNOSIS — R278 Other lack of coordination: Secondary | ICD-10-CM | POA: Diagnosis not present

## 2020-08-21 DIAGNOSIS — N179 Acute kidney failure, unspecified: Secondary | ICD-10-CM | POA: Diagnosis not present

## 2020-08-21 DIAGNOSIS — M6281 Muscle weakness (generalized): Secondary | ICD-10-CM | POA: Diagnosis not present

## 2020-08-23 DIAGNOSIS — M6281 Muscle weakness (generalized): Secondary | ICD-10-CM | POA: Diagnosis not present

## 2020-08-23 DIAGNOSIS — L89154 Pressure ulcer of sacral region, stage 4: Secondary | ICD-10-CM | POA: Diagnosis not present

## 2020-08-23 DIAGNOSIS — R278 Other lack of coordination: Secondary | ICD-10-CM | POA: Diagnosis not present

## 2020-08-23 DIAGNOSIS — R1312 Dysphagia, oropharyngeal phase: Secondary | ICD-10-CM | POA: Diagnosis not present

## 2020-08-23 DIAGNOSIS — N179 Acute kidney failure, unspecified: Secondary | ICD-10-CM | POA: Diagnosis not present

## 2020-08-23 DIAGNOSIS — R293 Abnormal posture: Secondary | ICD-10-CM | POA: Diagnosis not present

## 2020-08-23 DIAGNOSIS — G9341 Metabolic encephalopathy: Secondary | ICD-10-CM | POA: Diagnosis not present

## 2020-08-24 DIAGNOSIS — K5900 Constipation, unspecified: Secondary | ICD-10-CM | POA: Diagnosis not present

## 2020-08-24 DIAGNOSIS — E43 Unspecified severe protein-calorie malnutrition: Secondary | ICD-10-CM | POA: Diagnosis not present

## 2020-08-24 DIAGNOSIS — L89620 Pressure ulcer of left heel, unstageable: Secondary | ICD-10-CM | POA: Diagnosis not present

## 2020-08-24 DIAGNOSIS — R627 Adult failure to thrive: Secondary | ICD-10-CM | POA: Diagnosis not present

## 2020-08-24 DIAGNOSIS — R293 Abnormal posture: Secondary | ICD-10-CM | POA: Diagnosis not present

## 2020-08-24 DIAGNOSIS — L89154 Pressure ulcer of sacral region, stage 4: Secondary | ICD-10-CM | POA: Diagnosis not present

## 2020-08-24 DIAGNOSIS — M1990 Unspecified osteoarthritis, unspecified site: Secondary | ICD-10-CM | POA: Diagnosis not present

## 2020-08-24 DIAGNOSIS — D649 Anemia, unspecified: Secondary | ICD-10-CM | POA: Diagnosis not present

## 2020-08-24 DIAGNOSIS — R278 Other lack of coordination: Secondary | ICD-10-CM | POA: Diagnosis not present

## 2020-08-24 DIAGNOSIS — N183 Chronic kidney disease, stage 3 unspecified: Secondary | ICD-10-CM | POA: Diagnosis not present

## 2020-08-24 DIAGNOSIS — R1312 Dysphagia, oropharyngeal phase: Secondary | ICD-10-CM | POA: Diagnosis not present

## 2020-08-24 DIAGNOSIS — I10 Essential (primary) hypertension: Secondary | ICD-10-CM | POA: Diagnosis not present

## 2020-08-24 DIAGNOSIS — N179 Acute kidney failure, unspecified: Secondary | ICD-10-CM | POA: Diagnosis not present

## 2020-08-24 DIAGNOSIS — G9341 Metabolic encephalopathy: Secondary | ICD-10-CM | POA: Diagnosis not present

## 2020-08-24 DIAGNOSIS — K219 Gastro-esophageal reflux disease without esophagitis: Secondary | ICD-10-CM | POA: Diagnosis not present

## 2020-08-24 DIAGNOSIS — M109 Gout, unspecified: Secondary | ICD-10-CM | POA: Diagnosis not present

## 2020-08-24 DIAGNOSIS — M6281 Muscle weakness (generalized): Secondary | ICD-10-CM | POA: Diagnosis not present

## 2020-09-11 DIAGNOSIS — 419620001 Death: Secondary | SNOMED CT | POA: Diagnosis not present

## 2020-09-11 DEATH — deceased

## 2021-09-13 IMAGING — DX DG CHEST 1V PORT
2 series · 2 of 2 positions shown · non-contrast
Comparison: 07/18/2020

CLINICAL DATA: Hypotension

EXAM:
PORTABLE CHEST 1 VIEW

[chest ap (1 of 2)]
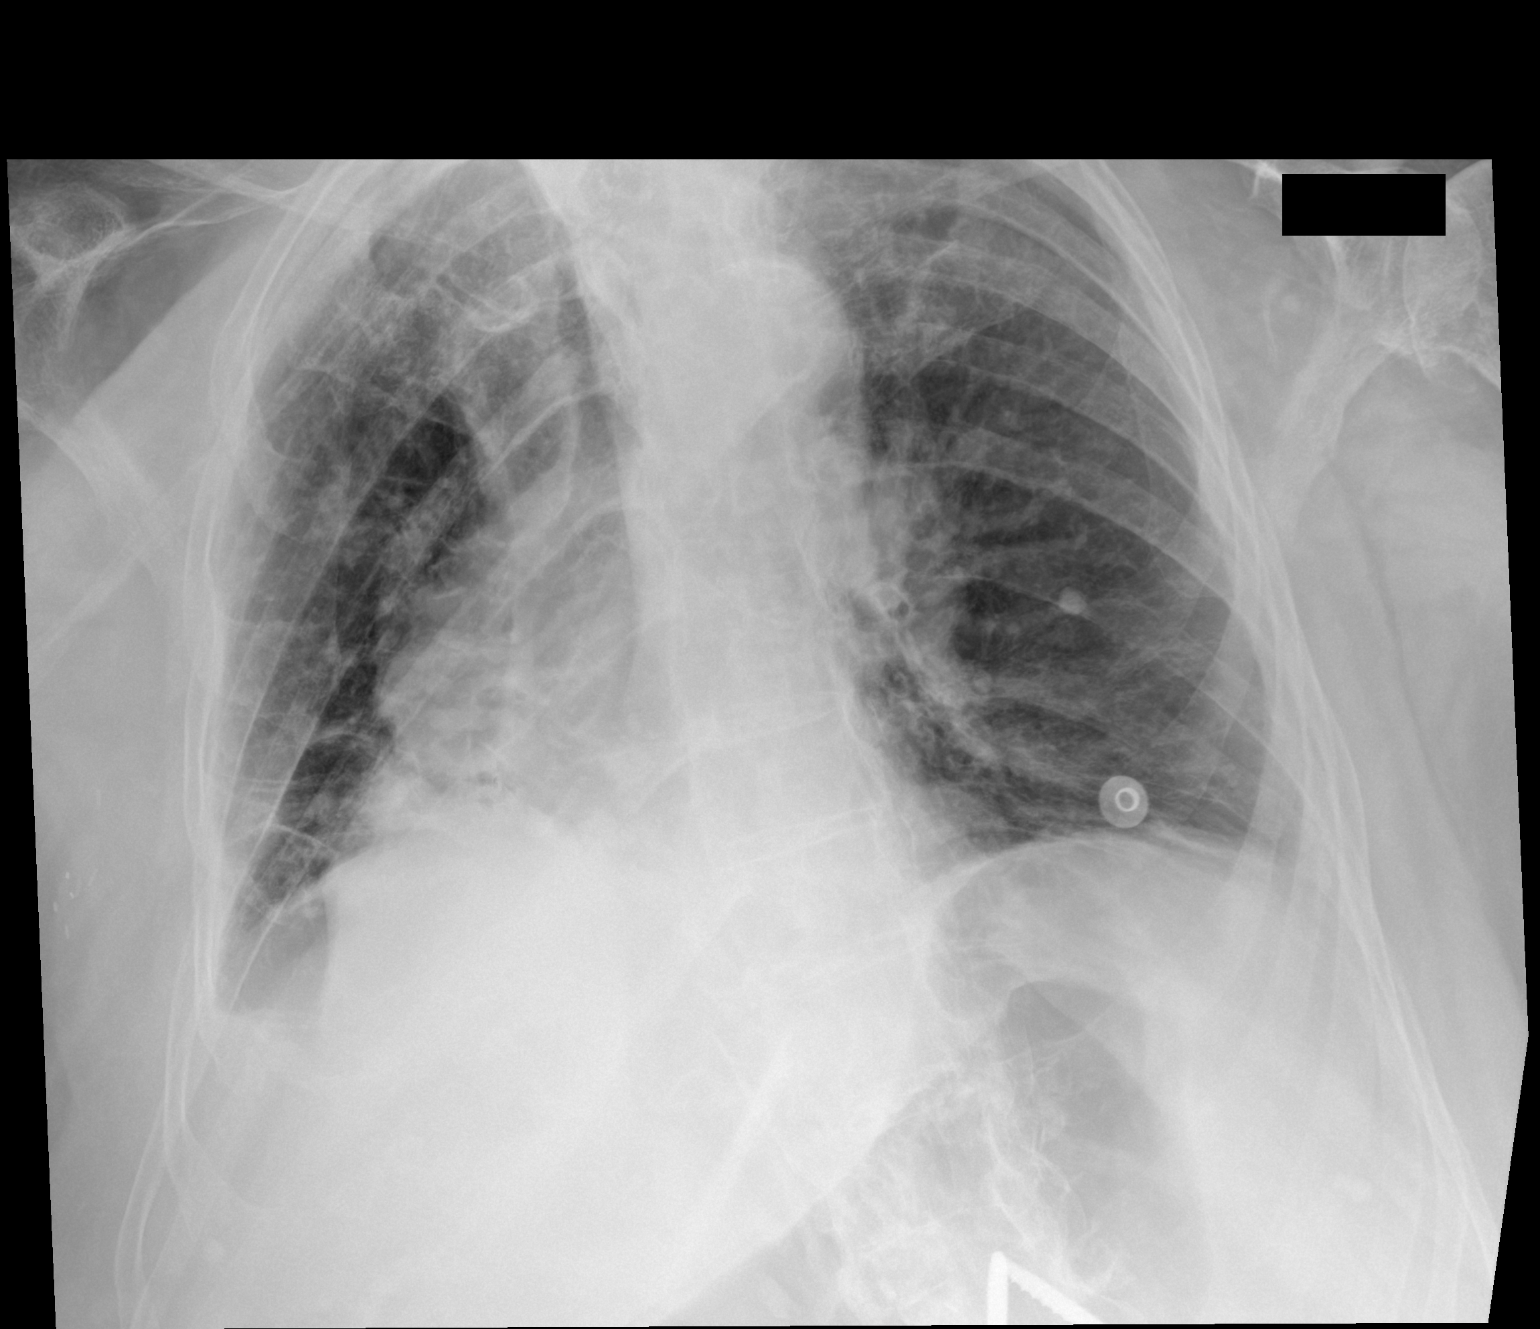

[chest ap (2 of 2)]
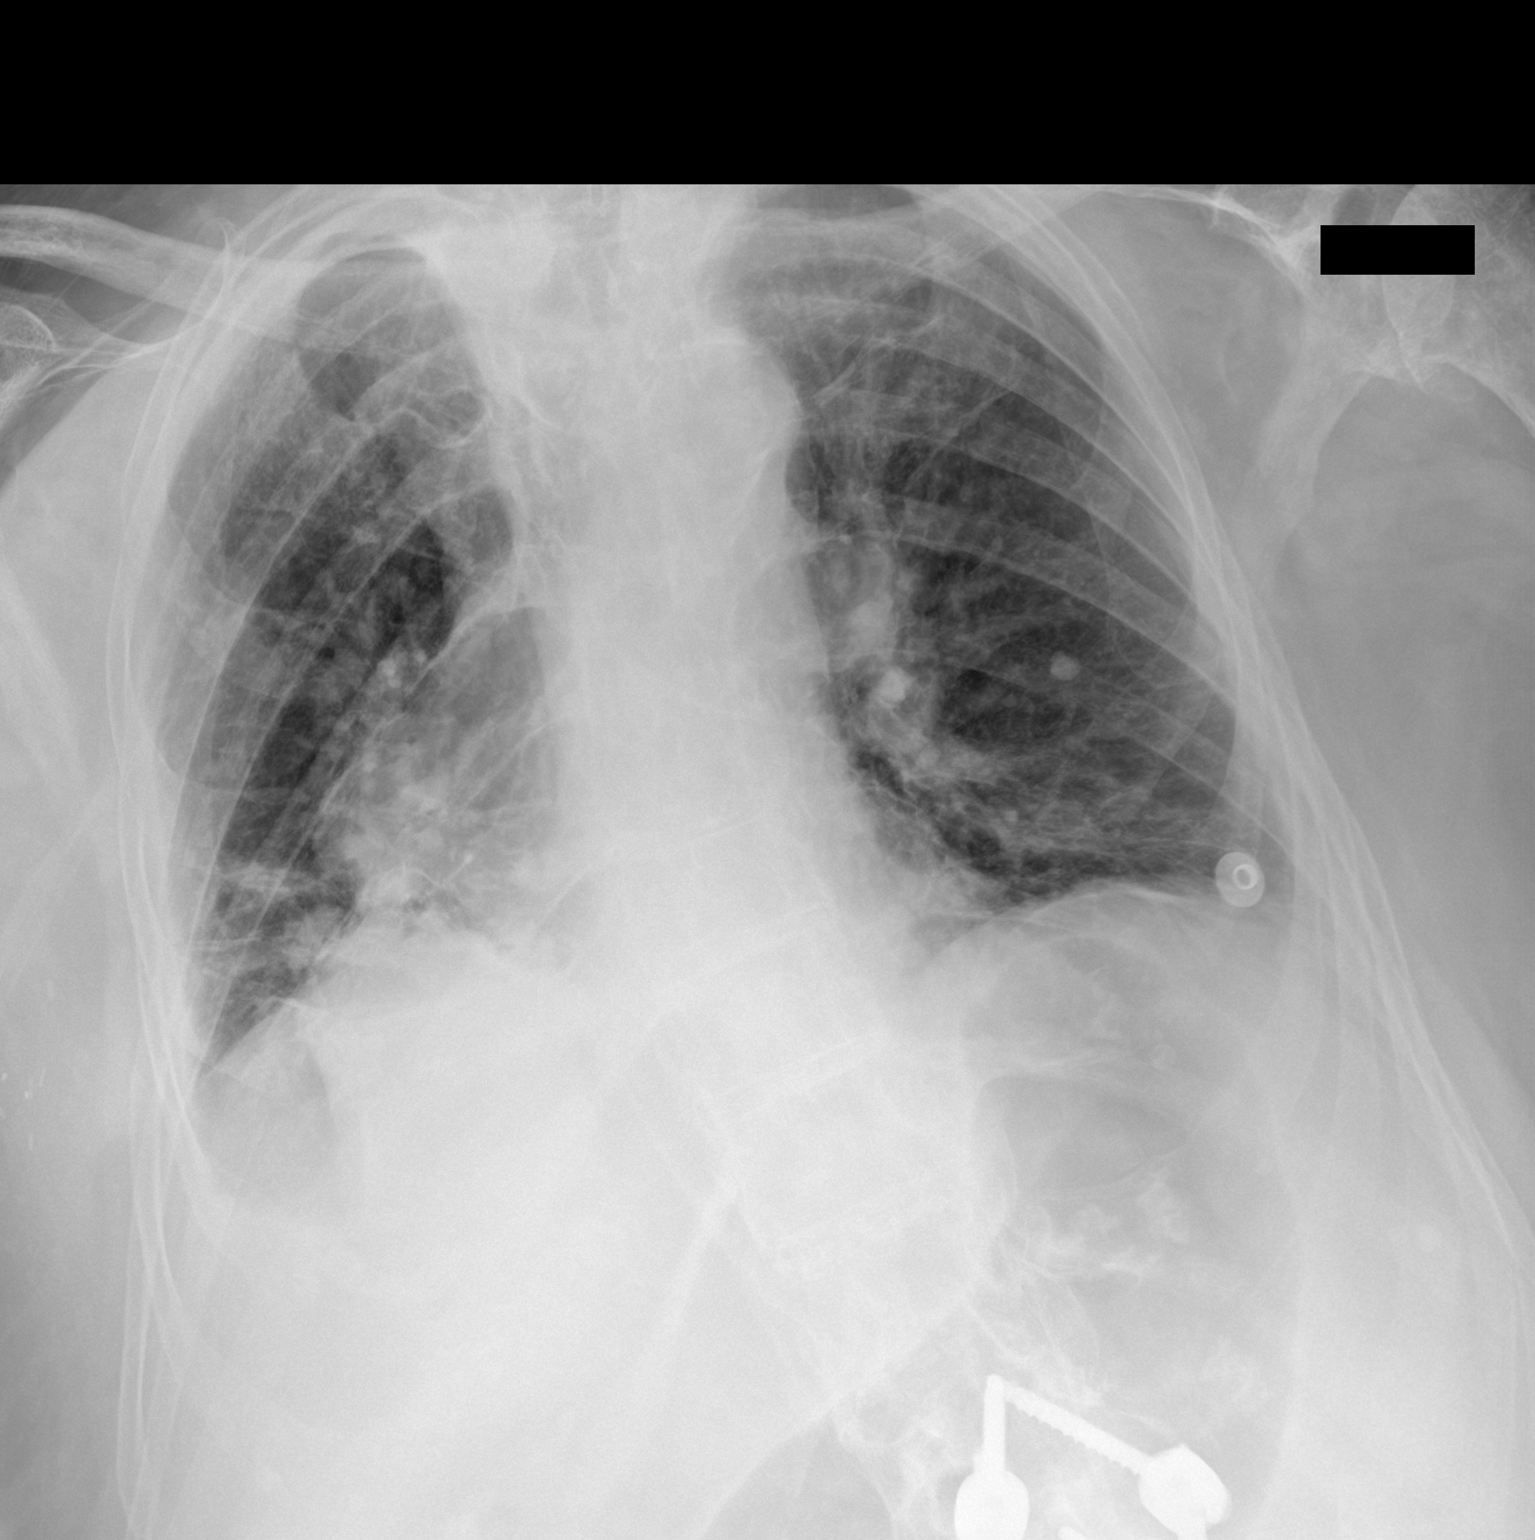

[2 of 2 positions shown; findings below may reference images not displayed]

FINDINGS: Patient is rotated to the right, limiting study. Bibasilar
atelectasis or infiltrates, right greater than left. Findings stable
since prior study
IMPRESSION: No significant change since recent study.

## 2021-09-23 IMAGING — DX DG WRIST 2V*R*
2 series · 2 of 2 positions shown · non-contrast
Comparison: None.

CLINICAL DATA: Right wrist pain without known injury.

EXAM:
RIGHT WRIST - 2 VIEW

[wrist ap]
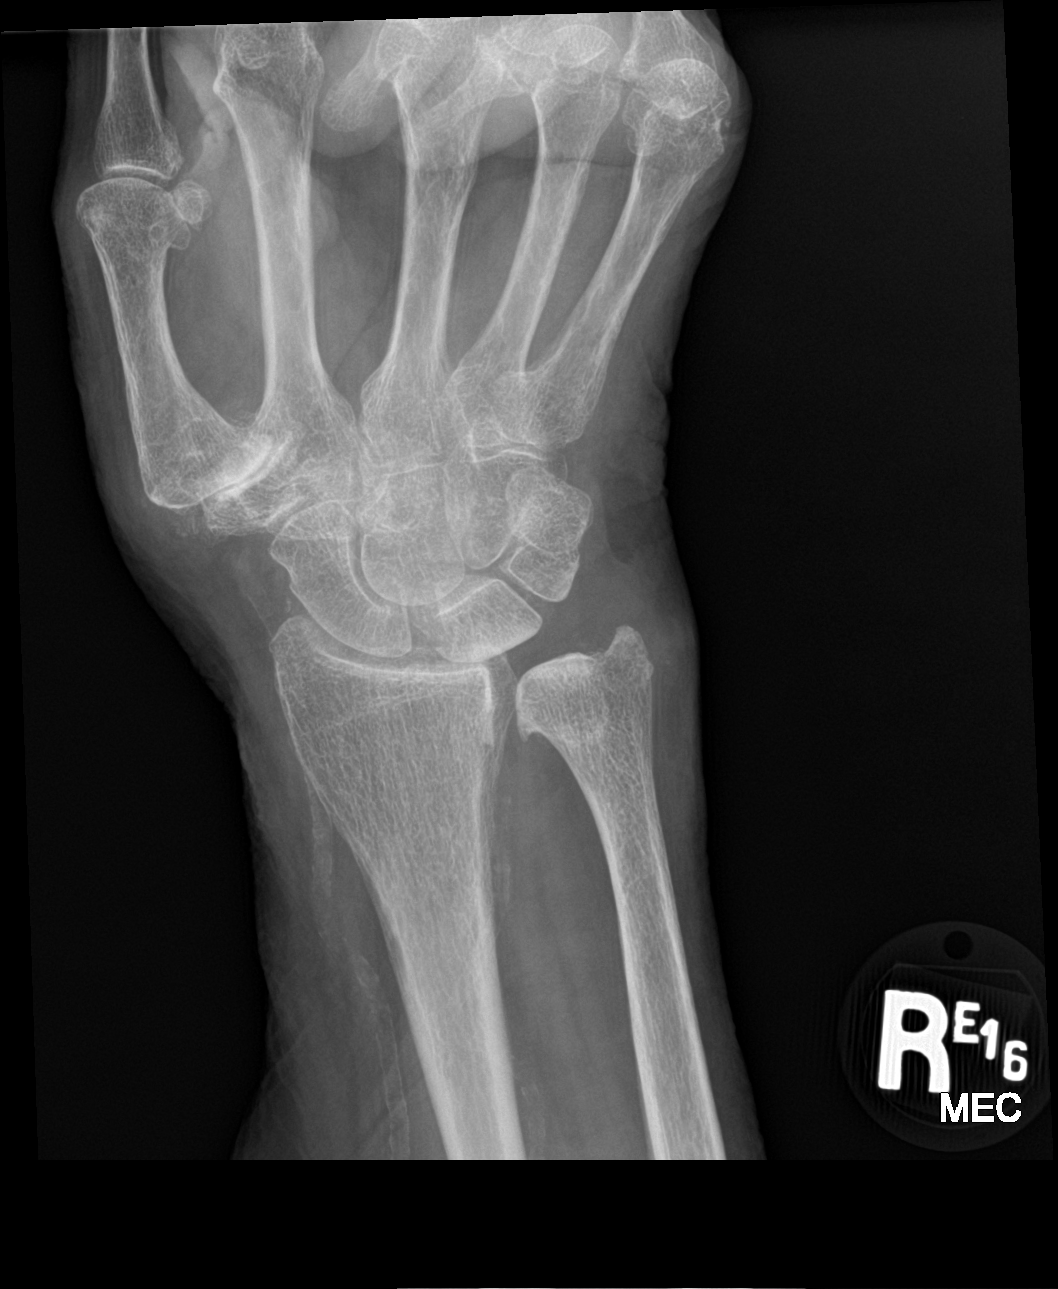

[wrist lat]
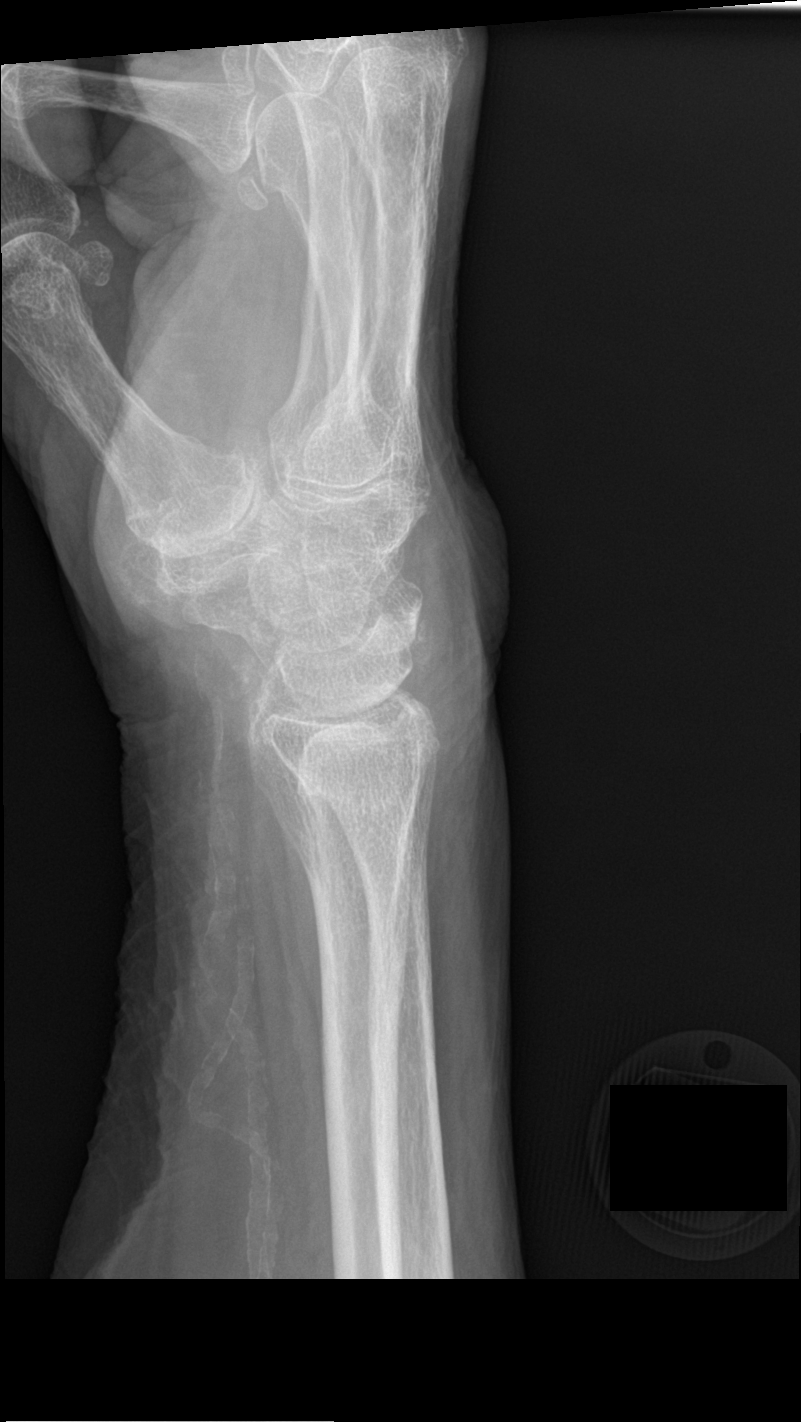

[2 of 2 positions shown; findings below may reference images not displayed]

FINDINGS: There is no evidence of fracture or dislocation. Severe degenerative
changes seen involving the first carpometacarpal joint. Vascular
calcifications are noted.
IMPRESSION: Severe osteoarthritis of the first carpometacarpal joint. No acute
abnormality seen in the right wrist.
